# Patient Record
Sex: Female | Born: 1950 | Race: White | Hispanic: No | Marital: Single | Smoking: Never smoker
Health system: Southern US, Community
[De-identification: ages and names within clinical notes are randomized; demographics above are authoritative.]

## PROBLEM LIST (undated history)

## (undated) DIAGNOSIS — F419 Anxiety disorder, unspecified: Secondary | ICD-10-CM

## (undated) DIAGNOSIS — M5412 Radiculopathy, cervical region: Secondary | ICD-10-CM

## (undated) DIAGNOSIS — M7989 Other specified soft tissue disorders: Secondary | ICD-10-CM

## (undated) DIAGNOSIS — N649 Disorder of breast, unspecified: Secondary | ICD-10-CM

## (undated) DIAGNOSIS — N289 Disorder of kidney and ureter, unspecified: Secondary | ICD-10-CM

## (undated) DIAGNOSIS — G4733 Obstructive sleep apnea (adult) (pediatric): Secondary | ICD-10-CM

## (undated) DIAGNOSIS — D126 Benign neoplasm of colon, unspecified: Secondary | ICD-10-CM

## (undated) DIAGNOSIS — I5030 Unspecified diastolic (congestive) heart failure: Secondary | ICD-10-CM

## (undated) DIAGNOSIS — I272 Pulmonary hypertension, unspecified: Secondary | ICD-10-CM

## (undated) DIAGNOSIS — M79606 Pain in leg, unspecified: Secondary | ICD-10-CM

## (undated) DIAGNOSIS — E538 Deficiency of other specified B group vitamins: Secondary | ICD-10-CM

## (undated) DIAGNOSIS — N183 Chronic kidney disease, stage 3 unspecified: Secondary | ICD-10-CM

## (undated) DIAGNOSIS — K819 Cholecystitis, unspecified: Secondary | ICD-10-CM

## (undated) DIAGNOSIS — E119 Type 2 diabetes mellitus without complications: Secondary | ICD-10-CM

## (undated) DIAGNOSIS — R0602 Shortness of breath: Secondary | ICD-10-CM

## (undated) DIAGNOSIS — M2550 Pain in unspecified joint: Secondary | ICD-10-CM

## (undated) DIAGNOSIS — E559 Vitamin D deficiency, unspecified: Secondary | ICD-10-CM

## (undated) DIAGNOSIS — F32A Depression, unspecified: Secondary | ICD-10-CM

## (undated) DIAGNOSIS — M109 Gout, unspecified: Secondary | ICD-10-CM

## (undated) DIAGNOSIS — M1990 Unspecified osteoarthritis, unspecified site: Secondary | ICD-10-CM

## (undated) DIAGNOSIS — I319 Disease of pericardium, unspecified: Secondary | ICD-10-CM

## (undated) DIAGNOSIS — H409 Unspecified glaucoma: Secondary | ICD-10-CM

## (undated) DIAGNOSIS — M549 Dorsalgia, unspecified: Secondary | ICD-10-CM

## (undated) DIAGNOSIS — E039 Hypothyroidism, unspecified: Secondary | ICD-10-CM

## (undated) DIAGNOSIS — E785 Hyperlipidemia, unspecified: Secondary | ICD-10-CM

## (undated) DIAGNOSIS — N611 Abscess of the breast and nipple: Secondary | ICD-10-CM

## (undated) DIAGNOSIS — K5900 Constipation, unspecified: Secondary | ICD-10-CM

## (undated) DIAGNOSIS — F329 Major depressive disorder, single episode, unspecified: Secondary | ICD-10-CM

## (undated) HISTORY — DX: Hypothyroidism, unspecified: E03.9

## (undated) HISTORY — DX: Benign neoplasm of colon, unspecified: D12.6

## (undated) HISTORY — DX: Deficiency of other specified B group vitamins: E53.8

## (undated) HISTORY — DX: Unspecified glaucoma: H40.9

## (undated) HISTORY — DX: Dorsalgia, unspecified: M54.9

## (undated) HISTORY — DX: Type 2 diabetes mellitus without complications: E11.9

## (undated) HISTORY — DX: Unspecified osteoarthritis, unspecified site: M19.90

## (undated) HISTORY — DX: Depression, unspecified: F32.A

## (undated) HISTORY — DX: Major depressive disorder, single episode, unspecified: F32.9

## (undated) HISTORY — DX: Anxiety disorder, unspecified: F41.9

## (undated) HISTORY — DX: Abscess of the breast and nipple: N61.1

## (undated) HISTORY — DX: Pain in unspecified joint: M25.50

## (undated) HISTORY — DX: Cholecystitis, unspecified: K81.9

## (undated) HISTORY — DX: Radiculopathy, cervical region: M54.12

## (undated) HISTORY — DX: Unspecified diastolic (congestive) heart failure: I50.30

## (undated) HISTORY — DX: Chronic kidney disease, stage 3 unspecified: N18.30

## (undated) HISTORY — DX: Disorder of kidney and ureter, unspecified: N28.9

## (undated) HISTORY — DX: Morbid (severe) obesity due to excess calories: E66.01

## (undated) HISTORY — DX: Chronic kidney disease, stage 3 (moderate): N18.3

## (undated) HISTORY — DX: Disease of pericardium, unspecified: I31.9

## (undated) HISTORY — DX: Gout, unspecified: M10.9

## (undated) HISTORY — DX: Pain in leg, unspecified: M79.606

## (undated) HISTORY — DX: Other specified soft tissue disorders: M79.89

## (undated) HISTORY — DX: Hyperlipidemia, unspecified: E78.5

## (undated) HISTORY — DX: Constipation, unspecified: K59.00

## (undated) HISTORY — DX: Disorder of breast, unspecified: N64.9

## (undated) HISTORY — DX: Obstructive sleep apnea (adult) (pediatric): G47.33

## (undated) HISTORY — DX: Vitamin D deficiency, unspecified: E55.9

## (undated) HISTORY — PX: CATARACT EXTRACTION: SUR2

## (undated) HISTORY — DX: Pulmonary hypertension, unspecified: I27.20

---
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
## 1988-10-06 HISTORY — PX: BREAST BIOPSY: SHX20

## 1998-06-27 ENCOUNTER — Encounter: Payer: Self-pay | Admitting: Family Medicine

## 1998-06-27 ENCOUNTER — Ambulatory Visit (HOSPITAL_COMMUNITY): Admission: RE | Admit: 1998-06-27 | Discharge: 1998-06-27 | Payer: Self-pay | Admitting: Family Medicine

## 1999-10-04 ENCOUNTER — Emergency Department (HOSPITAL_COMMUNITY): Admission: EM | Admit: 1999-10-04 | Discharge: 1999-10-04 | Payer: Self-pay | Admitting: *Deleted

## 1999-11-13 ENCOUNTER — Encounter: Payer: Self-pay | Admitting: Emergency Medicine

## 1999-11-13 ENCOUNTER — Emergency Department (HOSPITAL_COMMUNITY): Admission: EM | Admit: 1999-11-13 | Discharge: 1999-11-13 | Payer: Self-pay | Admitting: Emergency Medicine

## 1999-11-29 ENCOUNTER — Ambulatory Visit (HOSPITAL_COMMUNITY): Admission: RE | Admit: 1999-11-29 | Discharge: 1999-11-29 | Payer: Self-pay | Admitting: Orthopedic Surgery

## 1999-11-29 ENCOUNTER — Encounter: Payer: Self-pay | Admitting: Orthopedic Surgery

## 2000-08-25 ENCOUNTER — Ambulatory Visit (HOSPITAL_COMMUNITY): Admission: RE | Admit: 2000-08-25 | Discharge: 2000-08-25 | Payer: Self-pay | Admitting: Obstetrics and Gynecology

## 2000-08-25 ENCOUNTER — Encounter: Payer: Self-pay | Admitting: Obstetrics and Gynecology

## 2001-07-05 ENCOUNTER — Other Ambulatory Visit: Admission: RE | Admit: 2001-07-05 | Discharge: 2001-07-05 | Payer: Self-pay | Admitting: Obstetrics and Gynecology

## 2001-08-17 ENCOUNTER — Encounter: Payer: Self-pay | Admitting: Gastroenterology

## 2001-08-17 ENCOUNTER — Encounter (INDEPENDENT_AMBULATORY_CARE_PROVIDER_SITE_OTHER): Payer: Self-pay | Admitting: Specialist

## 2001-08-17 ENCOUNTER — Ambulatory Visit: Admission: RE | Admit: 2001-08-17 | Discharge: 2001-08-17 | Payer: Self-pay | Admitting: Gastroenterology

## 2002-06-30 ENCOUNTER — Encounter: Payer: Self-pay | Admitting: Internal Medicine

## 2002-06-30 ENCOUNTER — Ambulatory Visit (HOSPITAL_COMMUNITY): Admission: RE | Admit: 2002-06-30 | Discharge: 2002-06-30 | Payer: Self-pay | Admitting: Obstetrics

## 2002-07-08 ENCOUNTER — Other Ambulatory Visit: Admission: RE | Admit: 2002-07-08 | Discharge: 2002-07-08 | Payer: Self-pay | Admitting: Obstetrics and Gynecology

## 2002-10-24 ENCOUNTER — Ambulatory Visit (HOSPITAL_COMMUNITY): Admission: RE | Admit: 2002-10-24 | Discharge: 2002-10-24 | Payer: Self-pay | Admitting: Family Medicine

## 2002-10-24 ENCOUNTER — Encounter: Payer: Self-pay | Admitting: Family Medicine

## 2002-12-21 ENCOUNTER — Encounter: Admission: RE | Admit: 2002-12-21 | Discharge: 2003-03-21 | Payer: Self-pay | Admitting: Internal Medicine

## 2003-03-03 ENCOUNTER — Ambulatory Visit: Admission: RE | Admit: 2003-03-03 | Discharge: 2003-03-03 | Payer: Self-pay | Admitting: Family Medicine

## 2003-03-03 ENCOUNTER — Encounter: Payer: Self-pay | Admitting: Family Medicine

## 2003-03-22 ENCOUNTER — Encounter: Payer: Self-pay | Admitting: Family Medicine

## 2003-03-22 ENCOUNTER — Ambulatory Visit (HOSPITAL_COMMUNITY): Admission: RE | Admit: 2003-03-22 | Discharge: 2003-03-22 | Payer: Self-pay | Admitting: *Deleted

## 2004-07-10 ENCOUNTER — Ambulatory Visit (HOSPITAL_COMMUNITY): Admission: RE | Admit: 2004-07-10 | Discharge: 2004-07-10 | Payer: Self-pay | Admitting: Endocrinology

## 2004-07-16 ENCOUNTER — Other Ambulatory Visit: Admission: RE | Admit: 2004-07-16 | Discharge: 2004-07-16 | Payer: Self-pay | Admitting: Obstetrics and Gynecology

## 2005-07-23 ENCOUNTER — Ambulatory Visit (HOSPITAL_COMMUNITY): Admission: RE | Admit: 2005-07-23 | Discharge: 2005-07-23 | Payer: Self-pay | Admitting: Family Medicine

## 2005-08-04 ENCOUNTER — Other Ambulatory Visit: Admission: RE | Admit: 2005-08-04 | Discharge: 2005-08-04 | Payer: Self-pay | Admitting: Obstetrics and Gynecology

## 2005-12-10 ENCOUNTER — Ambulatory Visit: Payer: Self-pay | Admitting: Gastroenterology

## 2006-01-07 ENCOUNTER — Encounter: Admission: RE | Admit: 2006-01-07 | Discharge: 2006-01-07 | Payer: Self-pay | Admitting: Occupational Medicine

## 2006-01-21 ENCOUNTER — Ambulatory Visit: Payer: Self-pay | Admitting: Gastroenterology

## 2006-01-21 ENCOUNTER — Encounter (INDEPENDENT_AMBULATORY_CARE_PROVIDER_SITE_OTHER): Payer: Self-pay | Admitting: Specialist

## 2006-01-30 ENCOUNTER — Ambulatory Visit (HOSPITAL_COMMUNITY): Admission: RE | Admit: 2006-01-30 | Discharge: 2006-01-30 | Payer: Self-pay | Admitting: Occupational Medicine

## 2008-03-14 ENCOUNTER — Ambulatory Visit (HOSPITAL_COMMUNITY): Admission: RE | Admit: 2008-03-14 | Discharge: 2008-03-14 | Payer: Self-pay | Admitting: Family Medicine

## 2008-10-06 HISTORY — PX: SHOULDER SURGERY: SHX246

## 2009-01-17 ENCOUNTER — Inpatient Hospital Stay (HOSPITAL_COMMUNITY): Admission: RE | Admit: 2009-01-17 | Discharge: 2009-01-19 | Payer: Self-pay | Admitting: Orthopedic Surgery

## 2009-02-28 ENCOUNTER — Encounter: Admission: RE | Admit: 2009-02-28 | Discharge: 2009-05-29 | Payer: Self-pay | Admitting: Orthopedic Surgery

## 2009-06-05 ENCOUNTER — Encounter: Admission: RE | Admit: 2009-06-05 | Discharge: 2009-06-20 | Payer: Self-pay | Admitting: Orthopedic Surgery

## 2010-10-27 ENCOUNTER — Encounter: Payer: Self-pay | Admitting: Occupational Medicine

## 2010-12-02 ENCOUNTER — Institutional Professional Consult (permissible substitution) (INDEPENDENT_AMBULATORY_CARE_PROVIDER_SITE_OTHER): Payer: Commercial Managed Care - PPO | Admitting: Cardiology

## 2010-12-02 ENCOUNTER — Encounter: Payer: Self-pay | Admitting: Cardiology

## 2010-12-02 DIAGNOSIS — E1165 Type 2 diabetes mellitus with hyperglycemia: Secondary | ICD-10-CM | POA: Insufficient documentation

## 2010-12-02 DIAGNOSIS — R072 Precordial pain: Secondary | ICD-10-CM

## 2010-12-02 DIAGNOSIS — R609 Edema, unspecified: Secondary | ICD-10-CM | POA: Insufficient documentation

## 2010-12-02 DIAGNOSIS — N183 Chronic kidney disease, stage 3 (moderate): Secondary | ICD-10-CM

## 2010-12-02 DIAGNOSIS — E1122 Type 2 diabetes mellitus with diabetic chronic kidney disease: Secondary | ICD-10-CM

## 2010-12-02 DIAGNOSIS — R0602 Shortness of breath: Secondary | ICD-10-CM

## 2010-12-03 ENCOUNTER — Ambulatory Visit (INDEPENDENT_AMBULATORY_CARE_PROVIDER_SITE_OTHER)
Admission: RE | Admit: 2010-12-03 | Discharge: 2010-12-03 | Disposition: A | Discharge status: Home / Self Care | Payer: 59 | Source: Ambulatory Visit | Attending: Cardiology | Admitting: Cardiology

## 2010-12-03 ENCOUNTER — Encounter: Payer: Self-pay | Admitting: Cardiology

## 2010-12-03 ENCOUNTER — Ambulatory Visit (HOSPITAL_BASED_OUTPATIENT_CLINIC_OR_DEPARTMENT_OTHER): Payer: 59 | Attending: Cardiology

## 2010-12-03 ENCOUNTER — Other Ambulatory Visit: Payer: Self-pay | Admitting: Cardiology

## 2010-12-03 ENCOUNTER — Other Ambulatory Visit: Payer: 59

## 2010-12-03 ENCOUNTER — Encounter (INDEPENDENT_AMBULATORY_CARE_PROVIDER_SITE_OTHER): Payer: Self-pay | Admitting: *Deleted

## 2010-12-03 DIAGNOSIS — R072 Precordial pain: Secondary | ICD-10-CM

## 2010-12-03 DIAGNOSIS — R0602 Shortness of breath: Secondary | ICD-10-CM

## 2010-12-03 DIAGNOSIS — R609 Edema, unspecified: Secondary | ICD-10-CM

## 2010-12-03 DIAGNOSIS — G4733 Obstructive sleep apnea (adult) (pediatric): Secondary | ICD-10-CM | POA: Insufficient documentation

## 2010-12-03 LAB — T4, FREE: Free T4: 0.93 ng/dL (ref 0.60–1.60)

## 2010-12-03 LAB — HEPATIC FUNCTION PANEL
ALT: 14 U/L (ref 0–35)
AST: 17 U/L (ref 0–37)
Albumin: 3.1 g/dL — ABNORMAL LOW (ref 3.5–5.2)
Alkaline Phosphatase: 63 U/L (ref 39–117)
Total Protein: 6.7 g/dL (ref 6.0–8.3)

## 2010-12-03 LAB — BASIC METABOLIC PANEL
BUN: 17 mg/dL (ref 6–23)
CO2: 25 mEq/L (ref 19–32)
Chloride: 101 mEq/L (ref 96–112)
Creatinine, Ser: 1.2 mg/dL (ref 0.4–1.2)
GFR: 51.09 mL/min — ABNORMAL LOW (ref >60.00)
Potassium: 4.5 mEq/L (ref 3.5–5.1)

## 2010-12-03 LAB — CBC WITH DIFFERENTIAL/PLATELET
HCT: 36.4 % (ref 36.0–46.0)
Hemoglobin: 12.2 g/dL (ref 12.0–15.0)
Lymphs Abs: 3.3 10*3/uL (ref 0.7–4.0)
MCHC: 33.7 g/dL (ref 30.0–36.0)
MCV: 85.8 fl (ref 78.0–100.0)
Neutrophils Relative %: 55.5 % (ref 43.0–77.0)
RBC: 4.24 Mil/uL (ref 3.87–5.11)
RDW: 15.4 % — ABNORMAL HIGH (ref 11.5–14.6)

## 2010-12-04 ENCOUNTER — Telehealth: Payer: Self-pay | Admitting: Cardiology

## 2010-12-04 ENCOUNTER — Ambulatory Visit (HOSPITAL_COMMUNITY): Payer: 59 | Attending: Cardiovascular Disease

## 2010-12-04 ENCOUNTER — Other Ambulatory Visit: Payer: Self-pay | Admitting: Cardiology

## 2010-12-04 DIAGNOSIS — R0602 Shortness of breath: Secondary | ICD-10-CM | POA: Insufficient documentation

## 2010-12-04 DIAGNOSIS — I2699 Other pulmonary embolism without acute cor pulmonale: Secondary | ICD-10-CM

## 2010-12-04 DIAGNOSIS — R079 Chest pain, unspecified: Secondary | ICD-10-CM | POA: Insufficient documentation

## 2010-12-04 DIAGNOSIS — E119 Type 2 diabetes mellitus without complications: Secondary | ICD-10-CM | POA: Insufficient documentation

## 2010-12-04 DIAGNOSIS — R609 Edema, unspecified: Secondary | ICD-10-CM | POA: Insufficient documentation

## 2010-12-05 ENCOUNTER — Telehealth: Payer: Self-pay | Admitting: Cardiology

## 2010-12-05 ENCOUNTER — Ambulatory Visit (INDEPENDENT_AMBULATORY_CARE_PROVIDER_SITE_OTHER)
Admission: RE | Admit: 2010-12-05 | Discharge: 2010-12-05 | Disposition: A | Discharge status: Home / Self Care | Payer: 59 | Source: Ambulatory Visit | Attending: Cardiology | Admitting: Cardiology

## 2010-12-05 DIAGNOSIS — I2699 Other pulmonary embolism without acute cor pulmonale: Secondary | ICD-10-CM

## 2010-12-05 MED ORDER — IOHEXOL 300 MG/ML  SOLN
80.0000 mL | Freq: Once | INTRAMUSCULAR | Status: AC | PRN
  Administered 2010-12-05: 80 mL via INTRAVENOUS

## 2010-12-06 ENCOUNTER — Other Ambulatory Visit: Payer: 59

## 2010-12-12 ENCOUNTER — Ambulatory Visit (INDEPENDENT_AMBULATORY_CARE_PROVIDER_SITE_OTHER): Payer: 59 | Admitting: Cardiology

## 2010-12-12 ENCOUNTER — Encounter: Payer: Self-pay | Admitting: Cardiology

## 2010-12-12 DIAGNOSIS — R072 Precordial pain: Secondary | ICD-10-CM

## 2010-12-12 DIAGNOSIS — I319 Disease of pericardium, unspecified: Secondary | ICD-10-CM

## 2010-12-12 DIAGNOSIS — R0602 Shortness of breath: Secondary | ICD-10-CM

## 2010-12-12 HISTORY — DX: Disease of pericardium, unspecified: I31.9

## 2010-12-12 NOTE — Progress Notes (Signed)
Summary: CTA   Phone Note Outgoing Call   Call placed by: Julieta Gutting, RN, BSN,  December 04, 2010 4:46 PM Call placed to: Patient Summary of Call: Pt has slightly elevated D-dimer on labwork.  Per Dr Riley Kill obtain CTA of chest PE protocol on Friday 12/06/10. The pt has been scheduled for this test on 12/06/10 at 1:30 in the afternoon.  NPO 2 hours prior to test.  The pt does take Glucophage and should hold med the morning and night of test, also hold 48 hours after test. I attempted to reach the pt at work and home with no answer. Per allergy list this pt does not have IV dye allergy--please verify allergies again when speaking with the pt.  Initial call taken by: Julieta Gutting, RN, BSN,  December 04, 2010 4:46 PM  Follow-up for Phone Call        Attempted to reach pt at home number with no answer.  Julieta Gutting, RN, BSN  December 04, 2010 6:05 PM   Pt. aware of CTA  test scheduled for today at 3:30 PM . Pt. states has taken the Glucophage at 6:00 AM today. Rose at CT aware.Rose states, it will be okay. She will  remind the pt. to hold medication the next 2 days. Pt. notified. Follow-up by: Ollen Gross, RN, BSN,  December 05, 2010 10:49 AM

## 2010-12-12 NOTE — Progress Notes (Signed)
Summary: pt rtn call from yesterday   Phone Note Call from Patient Call back at [phone]   Caller: Patient Reason for Call: Talk to Nurse, Talk to Doctor, Lab or Test Results Summary of Call: pt rtn call from yesterday she thinks it was to get test results Initial call taken by: Omer Jack,  December 05, 2010 8:42 AM  Follow-up for Phone Call         Pt. aware of  D-dime result and  of CTA scheduled for today at 3:30 PM.  Follow-up by: Ollen Gross, RN, BSN,  December 05, 2010 11:03 AM

## 2010-12-12 NOTE — Assessment & Plan Note (Signed)
Summary: chestpain, sob  Medications Added GLUCOPHAGE 1000 MG TABS (METFORMIN HCL) Take 1 tablet by mouth two times a day CRESTOR 40 MG TABS (ROSUVASTATIN CALCIUM) Take one tablet by mouth daily. FUROSEMIDE 20 MG TABS (FUROSEMIDE) Take one tablet by mouth daily. * INSULIN 73/30 as directed      Allergies Added: ! PENICILLIN  Visit Type:  Initial Consult  CC:  Chest pains- Sob .  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  Her chief complaint is progressive shortness of breath that has occurred over about three to four months.  It has progressively gotten worse.  Of note, she has continued to gain weight during that period of time.  She has occasional brief short episodes of chest pain that wake her up at night, but do not last long.  Exertional chest tightness, as a rule, has not been a problem.   She falls asleep alot, and does snore at night.  Her BP has been recently climbing.  She is here for evaluation.  There is no radiation to her discomfort.    Current Medications (verified): 1)  Glucophage 1000 Mg Tabs (Metformin Hcl) .... Take 1 Tablet By Mouth Two Times A Day 2)  Crestor 40 Mg Tabs (Rosuvastatin Calcium) .... Take One Tablet By Mouth Daily. 3)  Furosemide 20 Mg Tabs (Furosemide) .... Take One Tablet By Mouth Daily. 4)  Insulin 73/30 .... As Directed  Allergies (verified): 1)  ! Penicillin  Past History:  Past Medical History: DM type 2 Chest pains Sob  Past Surgical History: Shoulder surgery - 2009 Tonsillectomy Breast biopsy  Family History: Mother alive 26- Heart problems- HTN Father alive 91- Bypass surgery- DM Brothers- 2.   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  The patient denies anorexia, fever, weight loss, vision loss, decreased hearing, hoarseness, syncope, prolonged cough, headaches, hemoptysis, abdominal pain, melena, hematochezia, severe indigestion/heartburn, and unusual weight change.    Vital Signs:  Patient profile:   60 year old female Height:      65 inches Weight:      307.50 pounds BMI:     51.36 O2 Sat:      95 % on Room air Pulse rate:   91 / minute Pulse rhythm:   regular Resp:     20 per minute BP sitting:   130 / 84  (right arm) Cuff size:   large  Vitals Entered By: Vikki Ports (December 02, 2010 4:35 PM)  O2 Flow:  Room air  Physical Exam  General:  obese female in NAD.  Very pleasant demeanor. Head:  normocephalic and atraumatic Eyes:  PERRLA/EOM intact; conjunctiva and lids normal. Neck:  Neck supple, no JVD. No masses, thyromegaly or abnormal cervical nodes. Lungs:  Clear bilaterally to auscultation and percussion. Heart:  PMI non displaced.  Normal S1 and S2.  No murmurs or rub.   No gallops. Abdomen:  No hepatosplenomegaly but hard to feel.  Large panus.  Multiple areas of excoriation secondary to curling iron burn. Msk:  Back normal, normal gait. Muscle strength and tone normal. Pulses:  pulses normal in all 4 extremities Extremities:  Trace edema. Neurologic:  Alert and oriented x 3.   EKG  Procedure date:  12/02/2010  Findings:      NSR.  Low voltage QRS.  Leftward axis.  Impression & Recommendations:  Problem # 1:  DYSPNEA (ICD-786.05) Patient has had progressive dyspnea.  The etiology of this is unclear.  It could be in part related to her weight, but the key will be trying to exclude other abnormalities.  Her exam is not revealing.  There is not a suggestion of occult DVT, but could not exclude.  PPH and PE must be in the differential.  With DM, cannot exclude CAD.  Her history also would suggest OSA.  Favor starting workup with 2D echo, labs,  sleep study.  If d-dimer elevated, would consider both CXR and CT scan to exclude PE.  Would not exclude chronic PE, which VQ would be better, but would hold off to see 2 D.  She will likely need an agressive nutrition program for long term success on multiple fronts.  Will see back in a week. Her updated medication list for this problem includes:    Furosemide 20 Mg Tabs (Furosemide) .Marland Kitchen... Take one tablet by mouth daily.  Orders: EKG w/ Interpretation (93000) T-2 View CXR (71020TC) Echocardiogram (Echo) Sleep Disorder Referral (Sleep Disorder)  Problem # 2:  CHEST PAIN, PRECORDIAL (ICD-786.51) Somewhat atypical but several risk factors.  Proceed with workup.  May need cath from radial, but defer until other workup complelte.  Orders: EKG w/ Interpretation (93000) T-2 View CXR (71020TC) Echocardiogram (Echo) Sleep Disorder Referral (Sleep Disorder)  Problem # 3:  DM (ICD-250.00) clearly type II.  Needs nutrition management.   Her updated medication list for this problem includes:    Glucophage 1000 Mg Tabs (Metformin hcl) .Marland Kitchen... Take 1 tablet by mouth two times a day  Patient Instructions: 1)  Your physician recommends that you schedule a follow-up appointment in: 2 WEEKS 2)  Your physician recommends that you return for lab work in: D-dimer, BMP, CBC, TSH, Free T4, Liver, and Chest x-ray 3)  Your physician has requested that you have an echocardiogram.  Echocardiography is a painless test that uses sound waves to create images of your heart. It provides your doctor with information about the size and shape of your heart and how well your heart's chambers and valves are working.  This procedure takes approximately one hour. There are no restrictions for this procedure. 4)  Your physician has recommended that you have a sleep study.  This test records several body functions during sleep, including:  brain activity, eye movement, oxygen and carbon dioxide blood levels, heart rate and rhythm,  breathing rate and rhythm, the flow of air through your mouth and nose, snoring, body muscle movements, and chest and belly movement.

## 2010-12-16 DIAGNOSIS — G4733 Obstructive sleep apnea (adult) (pediatric): Secondary | ICD-10-CM

## 2010-12-23 ENCOUNTER — Encounter: Payer: Self-pay | Admitting: Cardiology

## 2010-12-23 DIAGNOSIS — G4733 Obstructive sleep apnea (adult) (pediatric): Secondary | ICD-10-CM | POA: Insufficient documentation

## 2010-12-23 HISTORY — DX: Obstructive sleep apnea (adult) (pediatric): G47.33

## 2011-01-02 NOTE — Miscellaneous (Signed)
Summary: Orders Update  Clinical Lists Changes  Problems: Added new problem of OBSTRUCTIVE SLEEP APNEA (ICD-327.23) Orders: Added new Referral order of Pulmonary Referral (Pulmonary) - Signed

## 2011-01-02 NOTE — Assessment & Plan Note (Signed)
Summary: Alice Kemp   Visit Type:  Follow-up   History of Present Illness: I had Myrl return today in a follow up visit.  She had a preliminary sleep study but this has not been read yet.  She did have other studies done, and we have reviewed these today in detail.  She does continue to have some symptoms, and we discussed options with regard to further evaluation, including possible cardiac cath study.  Labs including thyroid have been unremarkable, but a d-dimer was slightly elevated thus leading to her CT angio to exclude pulmonary emboli.  Based on her weight, it would be hard to exclude DVT on exam, especially with presenting complaints of chest pain, and dyspnea.  A Magnone effusion was noted, but it was not presenting hemodynamic issues at present.   Problems Prior to Update: 1)  Pericardial Effusion  (ICD-423.9) 2)  Dm  (ICD-250.00) 3)  Dyspnea  (ICD-786.05) 4)  Edema  (ICD-782.3) 5)  Chest Pain, Precordial  (ICD-786.51)  Current Medications (verified): 1)  Glucophage 1000 Mg Tabs (Metformin Hcl) .... Take 1 Tablet By Mouth Two Times A Day 2)  Crestor 40 Mg Tabs (Rosuvastatin Calcium) .... Take One Tablet By Mouth Daily. 3)  Furosemide 20 Mg Tabs (Furosemide) .... Take One Tablet By Mouth Daily. 4)  Insulin 73/30 .... As Directed 5)  Aleve 220 Mg Tabs (Naproxen Sodium) .... 2-3 Tablets As Needed  Allergies (verified): 1)  ! Penicillin  Past History:  Past Medical History: Last updated: 12/02/2010 DM type 2 Chest pains Sob  Past Surgical History: Last updated: 12/02/2010 Shoulder surgery - 2009 Tonsillectomy Breast biopsy  Family History: Last updated: 12/02/2010 Mother alive 55- Heart problems- HTN Father alive 77- Bypass surgery- DM Brothers- 2.   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 Head:  normocephalic and atraumatic Eyes:  PERRLA/EOM intact; conjunctiva and lids normal. Lungs:  Clear bilaterally to auscultation and percussion. Heart:  PMI non displaced. Normal S1 and S2.  No murmur or rub.  No gallop Pulses:  pulses normal in all 4 extremities Extremities:  No clubbing or cyanosis. Neurologic:  Alert and oriented x 3.   CT Scan  Procedure date:  12/05/2010  Findings:      Comparison:  Chest radiograph 11/13/2010  Findings:  No filling defects within the pulmonary to suggest acute pulmonary embolism.  No acute findings of the aorta or great vessels.  There is a Caba pericardial effusion which measures 12 mm in depth along the dependent left ventricle.  No evidence of axillary or supraclavicular lymphadenopathy.  No mediastinal or hilar lymphadenopathy.  Esophagus is normal.  Review of the lung parenchyma demonstrates no pleural fluid, consolidation, or pneumothorax.  Limited view of the upper abdomen is unremarkable.  Limited view of the skeleton demonstrates osteophytosis of the thoracic spine.  The  Review of the MIP images confirms the above  findings.  IMPRESSION:  1.  No evidence of acute pulmonary embolism. 2.  No acute pulmonary parenchymal abnormality. 3.  Bin pericardial effusion.  Original Report Authenticated By: Genevive Bi, M.D.   Echocardiogram  Procedure date:  12/04/2010  Findings:      Left ventricle: The cavity size was normal. Wall thickness was       increased in a pattern of mild LVH. Systolic function was normal.       The estimated ejection fraction was in the range of 60% to  65%.       Although no diagnostic regional wall motion abnormality was       identified, this possibility cannot be completely excluded on the       basis of this study. Doppler parameters are consistent with       abnormal left ventricular relaxation (grade 1 diastolic       dysfunction).     - Aortic valve: There was no stenosis.     - Mitral valve: No significant regurgitation.     - Left atrium: The atrium was mildly dilated.     - Right ventricle: The cavity size was normal. Systolic function was       normal.     - Pulmonary arteries: No complete TR doppler jet so unable to       estimate PA systolic pressure.     - Systemic veins: The IVC was not visualized.     - Pericardium, extracardiac: Castagnola circumferential pericardial       effusion without tamponade.     Impressions:            - Normal LV size with mild LV hypertrophy. EF 60-65%. Normal RV size       and systolic function. No significant valvular dysfunction. Demery       circumferential pericardial effusion.  Impression & Recommendations:  Problem # 1:  PERICARDIAL EFFUSION (ICD-423.9) doubt this is the source of symptoms.  Nonetheless, repeat echo is indicated to exclude progression of effusion. Her updated medication list for this problem includes:    Furosemide 20 Mg Tabs (Furosemide) .Marland Kitchen... Take one tablet by mouth daily.    Aleve 220 Mg Tabs (Naproxen sodium) .Marland Kitchen... 2-3 tablets as needed  Orders: Echocardiogram (Echo)  Problem # 2:  CHEST PAIN, PRECORDIAL (ICD-786.51) I would consider a cardiac cath from the radial approach in light of her symptoms.  Historically she has a positive stress test.  I doubt a nuclear study would help [name] in this regard, because of the potential for breast attenuation.  She will consider Orders: Echocardiogram (Echo)  Problem # 3:  DM (ICD-250.00) currently on oral meds.  Needs weight loss going forward.   Her updated medication list for this problem includes:    Glucophage 1000  Mg Tabs (Metformin hcl) .Marland Kitchen... Take 1 tablet by mouth two times a day  Patient Instructions: 1)  Your physician recommends that you schedule a follow-up appointment in: 6 WEEKS 2)  Your physician recommends that you continue on your current medications as directed. Please refer to the Current Medication list given to you today. 3)  Your physician has requested that you have an echocardiogram in 6 WEEKS.  Echocardiography is a painless test that uses sound waves to create images of your heart. It provides your doctor with information about the size and shape of your heart and how well your heart's chambers and valves are working.  This procedure takes approximately one hour. There  are no restrictions for this procedure. 4)  Please call the office at [phone] if you would like to schedule cardiac catheterization.

## 2011-01-07 ENCOUNTER — Telehealth: Payer: Self-pay | Admitting: Cardiology

## 2011-01-07 NOTE — Telephone Encounter (Signed)
I spoke with the pt and she would like to know if our office can get her an earlier appointment with Pulmonary.  The pt is currently scheduled to see Dr Shelle Iron on 01/27/11.  I spoke with Shawna Orleans in the pulmonary department and Dr Shelle Iron does not have any earlier availability at this time.  She said the pt could call the office at any time to check for cancellations. Pt aware and agreed with plan.

## 2011-01-15 LAB — GLUCOSE, CAPILLARY
Glucose-Capillary: 148 mg/dL — ABNORMAL HIGH (ref 70–99)
Glucose-Capillary: 158 mg/dL — ABNORMAL HIGH (ref 70–99)
Glucose-Capillary: 181 mg/dL — ABNORMAL HIGH (ref 70–99)
Glucose-Capillary: 199 mg/dL — ABNORMAL HIGH (ref 70–99)
Glucose-Capillary: 204 mg/dL — ABNORMAL HIGH (ref 70–99)
Glucose-Capillary: 230 mg/dL — ABNORMAL HIGH (ref 70–99)

## 2011-01-15 LAB — CBC
HCT: 35.4 % — ABNORMAL LOW (ref 36.0–46.0)
MCV: 86.5 fL (ref 78.0–100.0)
Platelets: 346 10*3/uL (ref 150–400)
RDW: 14.6 % (ref 11.5–15.5)

## 2011-01-15 LAB — BASIC METABOLIC PANEL
CO2: 23 mEq/L (ref 19–32)
Creatinine, Ser: 1.22 mg/dL — ABNORMAL HIGH (ref 0.4–1.2)
Glucose, Bld: 317 mg/dL — ABNORMAL HIGH (ref 70–99)
Sodium: 133 mEq/L — ABNORMAL LOW (ref 135–145)

## 2011-01-15 LAB — TYPE AND SCREEN: ABO/RH(D): A POS

## 2011-01-17 LAB — BASIC METABOLIC PANEL
Potassium: 4.6 mmol/L (ref 3.4–5.3)
Sodium: 137 mmol/L (ref 137–147)

## 2011-01-17 LAB — LIPID PANEL
Cholesterol: 146 mg/dL (ref 0–200)
HDL: 40 mg/dL (ref 35–70)
LDL Cholesterol: 59 mg/dL

## 2011-01-18 ENCOUNTER — Encounter: Payer: Self-pay | Admitting: Cardiology

## 2011-01-20 ENCOUNTER — Other Ambulatory Visit (HOSPITAL_COMMUNITY): Payer: Self-pay | Admitting: Radiology

## 2011-01-20 DIAGNOSIS — I313 Pericardial effusion (noninflammatory): Secondary | ICD-10-CM

## 2011-01-21 ENCOUNTER — Ambulatory Visit (INDEPENDENT_AMBULATORY_CARE_PROVIDER_SITE_OTHER): Payer: 59 | Admitting: Cardiology

## 2011-01-21 ENCOUNTER — Encounter: Payer: Self-pay | Admitting: Cardiology

## 2011-01-21 ENCOUNTER — Ambulatory Visit (HOSPITAL_COMMUNITY): Payer: 59 | Attending: Cardiology | Admitting: Radiology

## 2011-01-21 DIAGNOSIS — R079 Chest pain, unspecified: Secondary | ICD-10-CM

## 2011-01-21 DIAGNOSIS — R0600 Dyspnea, unspecified: Secondary | ICD-10-CM

## 2011-01-21 DIAGNOSIS — I313 Pericardial effusion (noninflammatory): Secondary | ICD-10-CM

## 2011-01-21 DIAGNOSIS — R0989 Other specified symptoms and signs involving the circulatory and respiratory systems: Secondary | ICD-10-CM | POA: Insufficient documentation

## 2011-01-21 DIAGNOSIS — R072 Precordial pain: Secondary | ICD-10-CM

## 2011-01-21 DIAGNOSIS — E785 Hyperlipidemia, unspecified: Secondary | ICD-10-CM | POA: Insufficient documentation

## 2011-01-21 DIAGNOSIS — I319 Disease of pericardium, unspecified: Secondary | ICD-10-CM

## 2011-01-21 DIAGNOSIS — R0609 Other forms of dyspnea: Secondary | ICD-10-CM | POA: Insufficient documentation

## 2011-01-21 NOTE — Patient Instructions (Signed)
Your physician has requested that you have a cardiac catheterization. Cardiac catheterization is used to diagnose and/or treat various heart conditions. Doctors may recommend this procedure for a number of different reasons. The most common reason is to evaluate chest pain. Chest pain can be a symptom of coronary artery disease (CAD), and cardiac catheterization can show whether plaque is narrowing or blocking your heart's arteries. This procedure is also used to evaluate the valves, as well as measure the blood flow and oxygen levels in different parts of your heart. For further information please visit www.cardiosmart.org. Please follow instruction sheet, as given.   Your physician recommends that you continue on your current medications as directed. Please refer to the Current Medication list given to you today.  

## 2011-01-27 ENCOUNTER — Ambulatory Visit (INDEPENDENT_AMBULATORY_CARE_PROVIDER_SITE_OTHER): Payer: 59 | Admitting: Pulmonary Disease

## 2011-01-27 ENCOUNTER — Encounter: Payer: Self-pay | Admitting: Pulmonary Disease

## 2011-01-27 VITALS — BP 126/82 | HR 99 | Temp 98.3°F | Ht 64.0 in | Wt 300.0 lb

## 2011-01-27 DIAGNOSIS — G4733 Obstructive sleep apnea (adult) (pediatric): Secondary | ICD-10-CM

## 2011-01-27 NOTE — Patient Instructions (Signed)
Would strongly consider trial of cpap while working on weight loss.  An alternative, but not as good, would be dental appliance.  Please consider, and let me know.

## 2011-01-27 NOTE — Progress Notes (Signed)
Subjective:    Patient ID: Alice Kemp, female    DOB: 07/11/1951, 60 y.o.   MRN: [id number]  HPI The pt is a 59y/o female who I have been asked to see for management of osa.  She underwent npsg 11/2010, where she was found to have AHI 25/hr and significant cpap intolerance.  Her history is significant for the following:  -loud snoring with arousals, and nonrestorative sleep. - EDS during day when not working (stays very busy at work).  Denies sleepiness issues with driving. -weight increased 35-40 pounds over the last two years.   Sleep Questionnaire:   What time do you typically go to bed?( Between what hours)  10 to 11 pm         How long does it take you to fall asleep?  15 mins         How many times during the night do you wake up?  3         What time do you get out of bed to start your day?  0730         Do you drive or operate heavy machinery in your occupation?  No         How much has your weight changed (up or down) over the past two years? (In pounds)  40 lb (18.144 kg)         Have you ever had a sleep study before?   Yes         If yes, location of study?  [facility]         If yes, date of study?  11/2010         Do you currently use CPAP?  No         Do you wear oxygen at any time?  No              Review of Systems  Constitutional: Positive for unexpected weight change. Negative for fever.  HENT: Positive for congestion. Negative for ear pain, nosebleeds, sore throat, rhinorrhea, sneezing, trouble swallowing, dental problem, postnasal drip and sinus pressure.   Eyes: Positive for itching. Negative for redness.  Respiratory: Positive for cough and shortness of breath. Negative for chest tightness and wheezing.   Cardiovascular: Positive for chest pain and leg swelling. Negative for palpitations.  Gastrointestinal: Negative for nausea and vomiting.  Genitourinary: Negative for dysuria.  Musculoskeletal: Positive for joint swelling.  Skin: Negative for rash.    Neurological: Negative for headaches.  Hematological: Does not bruise/bleed easily.  Psychiatric/Behavioral: Negative for dysphoric mood. The patient is not nervous/anxious.        Objective:   Physical Exam Constitutional:  Obese female , no acute distress  HENT:  Nares patent without discharge, but narrowing on right  Oropharynx without exudate, palate and uvula are elongated with narrowing posteriorly  Eyes:  Perrla, eomi, no scleral icterus  Neck:  No JVD, no TMG  Cardiovascular:  Normal rate, regular rhythm, no rubs or gallops.  No murmurs        Intact distal pulses  Pulmonary :  Normal breath sounds, no stridor or respiratory distress   No rales, rhonchi, or wheezing  Abdominal:  Soft, nondistended, bowel sounds present.  No tenderness noted.   Musculoskeletal: 1+ lower extremity edema noted.  Lymph Nodes:  No cervical lymphadenopathy noted  Skin:  No cyanosis noted  Neurologic:  Awake but appears sleepy at time, appropriate, moves all 4 extremities without obvious deficit.  Assessment & Plan:

## 2011-01-27 NOTE — Assessment & Plan Note (Signed)
The pt has at least moderate osa, but has very significant oxygen desat.  Overall, I am concerned she really has severe disease.  I have had a long discussion with the pt about sleep apnea, including its impact on QOL and CV health.  I have recommended she try cpap while working on weight loss, but she doesn't feel she could tolerate this.  I have asked her to reconsider, and explained it is much easier to be desensitized in her own home than at sleep center.  As an alternative, she could consider a dental appliance, and I have given her literature on this to review.

## 2011-01-28 ENCOUNTER — Observation Stay (HOSPITAL_COMMUNITY)
Admission: RE | Admit: 2011-01-28 | Discharge: 2011-01-29 | Disposition: A | Discharge status: Home / Self Care | Payer: 59 | Source: Ambulatory Visit | Attending: Cardiology | Admitting: Cardiology

## 2011-01-28 DIAGNOSIS — Z794 Long term (current) use of insulin: Secondary | ICD-10-CM | POA: Insufficient documentation

## 2011-01-28 DIAGNOSIS — E119 Type 2 diabetes mellitus without complications: Secondary | ICD-10-CM | POA: Insufficient documentation

## 2011-01-28 DIAGNOSIS — G4730 Sleep apnea, unspecified: Secondary | ICD-10-CM | POA: Insufficient documentation

## 2011-01-28 DIAGNOSIS — I319 Disease of pericardium, unspecified: Secondary | ICD-10-CM | POA: Insufficient documentation

## 2011-01-28 DIAGNOSIS — R0602 Shortness of breath: Principal | ICD-10-CM | POA: Insufficient documentation

## 2011-01-28 DIAGNOSIS — Z01818 Encounter for other preprocedural examination: Secondary | ICD-10-CM | POA: Insufficient documentation

## 2011-01-28 DIAGNOSIS — E669 Obesity, unspecified: Secondary | ICD-10-CM | POA: Insufficient documentation

## 2011-01-28 DIAGNOSIS — R079 Chest pain, unspecified: Secondary | ICD-10-CM | POA: Insufficient documentation

## 2011-01-28 DIAGNOSIS — Z01810 Encounter for preprocedural cardiovascular examination: Secondary | ICD-10-CM | POA: Insufficient documentation

## 2011-01-28 LAB — POCT I-STAT 3, VENOUS BLOOD GAS (G3P V)
Acid-base deficit: 2 mmol/L (ref 0.0–2.0)
O2 Saturation: 56 %
O2 Saturation: 58 %
TCO2: 26 mmol/L (ref 0–100)
pCO2, Ven: 46.4 mmHg (ref 45.0–50.0)
pH, Ven: 7.375 — ABNORMAL HIGH (ref 7.250–7.300)

## 2011-01-28 LAB — CBC
HCT: 41.4 % (ref 36.0–46.0)
Hemoglobin: 13.8 g/dL (ref 12.0–15.0)
MCH: 28.2 pg (ref 26.0–34.0)
MCHC: 33.3 g/dL (ref 30.0–36.0)
MCV: 84.5 fL (ref 78.0–100.0)

## 2011-01-28 LAB — POCT I-STAT 3, ART BLOOD GAS (G3+)
Bicarbonate: 25.8 mEq/L — ABNORMAL HIGH (ref 20.0–24.0)
TCO2: 27 mmol/L (ref 0–100)
pCO2 arterial: 39.8 mmHg (ref 35.0–45.0)
pH, Arterial: 7.42 — ABNORMAL HIGH (ref 7.350–7.400)

## 2011-01-28 LAB — BASIC METABOLIC PANEL
CO2: 28 mEq/L (ref 19–32)
Calcium: 8.8 mg/dL (ref 8.4–10.5)
Chloride: 100 mEq/L (ref 96–112)
Creatinine, Ser: 1.17 mg/dL (ref 0.4–1.2)
Glucose, Bld: 301 mg/dL — ABNORMAL HIGH (ref 70–99)

## 2011-01-28 LAB — GLUCOSE, CAPILLARY
Glucose-Capillary: 298 mg/dL — ABNORMAL HIGH (ref 70–99)
Glucose-Capillary: 174 mg/dL — ABNORMAL HIGH (ref 70–99)

## 2011-01-28 NOTE — Assessment & Plan Note (Signed)
Symptoms of chest pain and shortness of breath remain, and or somewhat progressed.  I think she has risk factors with her DM, and clearly symptoms are difficult in light of her weight and activity.  CT is neg for PE, and echo shows preserved LV.  Think that cor angio with probably RHC is best option for at least getting to this.  She is scheduled to see Dr. Shelle Iron as well, but will set up cath.  Patient understands and consents to risks.

## 2011-01-28 NOTE — Assessment & Plan Note (Signed)
LV is normal, and effusion Schumacher.  Continue to observe.  Does not appear to be hemodynamic.

## 2011-01-28 NOTE — Progress Notes (Signed)
HPI:  The patient continues to have marked shortness of breath with exertion.  This has not improved.  Echo study reviewed with her.  She is scheduled to see Dr. Shelle Iron next week.   She cannot get very far, and gets some discomfort and winded.  Has not had prolonged rest chest pain.  Prior CT has been done and does not demonstrate evidence of PE.  However, she remains very symptomatic at this point.  Her risk factors include pos family history and DM.  No current outpatient prescriptions on file.    Allergies  Allergen Reactions  . Penicillins     Past Medical History  Diagnosis Date  . Diabetes mellitus     metformin  . Chest pain   . SOB (shortness of breath)   . OSA (obstructive sleep apnea)   . Hyperlipidemia     Past Surgical History  Procedure Date  . Shoulder surgery 2010    Left  . Tonsillectomy 1964  . Breast biopsy 1990    Left    Family History  Problem Relation Age of Onset  . Hypertension Mother   . Diabetes Father   . Hypertension Brother   . Heart disease Mother   . Heart disease Father   . Skin cancer Father   . Skin cancer Paternal Grandfather   . Breast cancer Paternal Aunt     History   Social History  . Marital Status: Single    Spouse Name: N/A    Number of Children: 0  . Years of Education: N/A   Occupational History  . 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 Smoking status: Never Smoker   . Smokeless tobacco: Not on file  . Alcohol Use: No  . Drug Use: No  . Sexually Active: Not on file   Other Topics Concern  . Not on file   Social History Narrative   Lives alone.    ROS: Please see the HPI.  All other systems reviewed and negative.  PHYSICAL EXAM:  BP 130/80  Pulse 94  Ht 5\' 4"  (1.626 m)  Wt 305 lb (138.347 kg)  BMI 52.35 kg/m2  General: Obese but very pleasant female in no acute distress. Head:  Normocephalic and atraumatic. Neck: no JVD Lungs: Clear to  auscultation and percussion. Heart: Normal S1 and S2.  No murmur, rubs or gallops.  Abdomen:  Normal bowel sounds; soft; non tender; no organomegaly Pulses: Pulses normal in all 4 extremities. Extremities: No clubbing or cyanosis. No edema. Neurologic: Alert and oriented x 3.  EKG:  ASSESSMENT AND PLAN:

## 2011-01-29 ENCOUNTER — Telehealth: Payer: Self-pay | Admitting: Cardiology

## 2011-01-29 DIAGNOSIS — R0600 Dyspnea, unspecified: Secondary | ICD-10-CM

## 2011-01-29 LAB — BASIC METABOLIC PANEL
Calcium: 8.8 mg/dL (ref 8.4–10.5)
GFR calc Af Amer: >60 mL/min (ref >60)
GFR calc non Af Amer: 55 mL/min — ABNORMAL LOW (ref >60)
Glucose, Bld: 196 mg/dL — ABNORMAL HIGH (ref 70–99)
Potassium: 4.4 mEq/L (ref 3.5–5.1)
Sodium: 139 mEq/L (ref 135–145)

## 2011-01-29 LAB — CBC
HCT: 38.3 % (ref 36.0–46.0)
MCHC: 32.9 g/dL (ref 30.0–36.0)
Platelets: 246 10*3/uL (ref 150–400)
RDW: 14.8 % (ref 11.5–15.5)
WBC: 10.1 10*3/uL (ref 4.0–10.5)

## 2011-01-29 LAB — GLUCOSE, CAPILLARY: Glucose-Capillary: 277 mg/dL — ABNORMAL HIGH (ref 70–99)

## 2011-01-29 NOTE — Telephone Encounter (Signed)
I spoke with the pt and she said that she had given [name] the incorrect dosage on her medications.  The pt has actually been taking Furosemide 40mg  once a day and Crestor 20mg  daily.  Per our records we had that the pt was taking 20mg  of Furosemide and 40mg  of Crestor.  I will correct her medication list.  The pt also said that Dr Riley Kill had increased her Furosemide after her cath to 40mg  daily because he thought she was taking 20mg  daily.  I will clarify what dose of Furosemide Dr Riley Kill would like for this pt to take.

## 2011-01-29 NOTE — Telephone Encounter (Signed)
Pt needs to talk to lauren re her meds.

## 2011-01-30 NOTE — Telephone Encounter (Signed)
I spoke with Dr Riley Kill and he would like the pt to increase Furosemide to 40mg  one and one-half tablet daily.  BMP on Monday and OV next week with Dr Riley Kill.

## 2011-01-30 NOTE — Discharge Summary (Addendum)
NAME:  Alice Kemp, [facility]                ACCOUNT NO.:  [id number]  MEDICAL RECORD NO.:  [id number]           PATIENT TYPE:  O  LOCATION:  6533                         FACILITY:  [facility]  PHYSICIAN:  Verne Carrow, MDDATE OF BIRTH:  [birth year]  DATE OF ADMISSION:  01/28/2011 DATE OF DISCHARGE:  01/29/2011                              DISCHARGE SUMMARY   DISCHARGE DIAGNOSES: 1. Dyspnea on exertion.     a.     Cardiac catheterization on January 28, 2011 showing no high-      grade focal obstructive coronary artery disease, with underlying      to elevated pulmonary artery pressures probably due to left      ventricular diastolic dysfunction.     b.     CT angiogram done in March 2012, showing no evidence of      pulmonary embolism.     c.     Lasix increased to 40 mg daily. 2. Insulin-dependent diabetes. 3. Obstructive sleep apnea. 4. Hyperlipidemia.  HOSPITAL COURSE:  Alice Kemp is a 60 year old female with a past medical history pertinent for diabetes, sleep apnea, and hyperlipidemia who presented to Alice Kemp office with complaints of shortness of breath on exertion.  She has not had prolonged chest pain.  Her CT had been done and demonstrated no evidence of PE, but she remained very symptomatic.  Given her risk factors, Alice Kemp felt that left and right heart cath were best to further evaluate this.  She was brought in for this procedure yesterday, which demonstrated only some suggestion of mild diabetic changes in the mid-to-distal LAD, without any areas of high-grade coronary artery obstruction.  She had elevated pulmonary artery pressures that were felt due to left ventricular diastolic dysfunction, which felt to be in part related to underlying sleep apnea and obesity.  Her Lasix was increased to 40 mg daily with the addition of low-dose daily potassium.  The patient did well postprocedurally. Alice Kemp has seen and examined her today and feels she is stable  for discharge.  DISCHARGE LABS:  WBC 10.1, hemoglobin 12.6, hematocrit 38.3, platelet count 246.  Sodium 139, potassium 4.4, chloride 102, CO2 20, glucose 196, BUN 13, creatinine 1.03.  STUDIES:  Cardiac catheterization on January 28, 2011, please see full report for details.  DISCHARGE MEDICATIONS: 1. Potassium chloride 20 mEq daily. 2. Lasix 20 mg two tablets daily. 3. Crestor 40 mg daily. 4. Metformin 1000 mg b.i.d. with instructions not to start until January 31, 2011. 5. NovoLog Mix 70/30 75 units in the morning, 50 at lunch, and 75 at     night. 6. Aleve 220 mg two tablets daily as needed. 7. Advil 200 mg four tablets daily as needed.  Please note that the patient was instructed not to take Advil and Aleve at the same time because they are in the same cause of medicine, avoid these for two days after catheterization.  She was also taking two to four tablets of Aleve at a time, was instructed that the maximum dose of no more than two tablets at a time and no  more than twice a day.  DISPOSITION:  Alice Kemp will be discharged in stable condition to home. She may return to work on February 03, 2011 per Alice Kemp.  She is not to lift anything over 5 pounds for 1 week, drive for 2 days, or participate in sexual activity for 1 week.  She should follow a low- sodium, heart-healthy, diabetic diet and to call or return if she notices any pain, swelling, bleeding, or pus at her cath site.  She will follow up with BMET at Alice Kemp office on Feb 05, 2011 and see Alice Kemp in followup on Feb 11, 2011 at 10:15 a.m.  DURATION OF DISCHARGE ENCOUNTER:  Greater than 30 minutes including physician and PA time.     Ronie Spies, P.A.C.   ______________________________ Verne Carrow, MD    DD/MEDQ  D:  01/29/2011  T:  01/29/2011  Job:  [id number]  cc:   Alice Morton. Riley Kill, MD, [facility] Alice Share, MD,FCCP  Electronically Signed by Verne Carrow MD on 01/30/2011  08:27:11 AM MedRecNo: [id number] [facility], Account: [id number], DocSeq: [id number] Addendum to discharge medications: Pt was discharged with instructions to take 1 81mg  baby aspirin daily. Electronically Signed by Ronie Spies  on 02/04/2011 04:35:20 PM

## 2011-01-30 NOTE — Telephone Encounter (Signed)
Left message for pt to call back  °

## 2011-01-31 NOTE — Telephone Encounter (Signed)
Pt rtn call from yesterday please call  [phone] pt will be on cell after 9:30 cell [phone]

## 2011-02-02 ENCOUNTER — Encounter: Payer: Self-pay | Admitting: Pulmonary Disease

## 2011-02-03 ENCOUNTER — Encounter: Payer: Self-pay | Admitting: Cardiology

## 2011-02-03 NOTE — Telephone Encounter (Signed)
This encounter was created in error - please disregard.

## 2011-02-03 NOTE — Telephone Encounter (Addendum)
Pt would like to speak with Thai Hemrick. Pt only wants to talk to the nurse. Per note from [place] (she opened another phone note). [phone].  I called and left the pt a message to call back.  Message from 01/31/11 was sent to my in basket and this was my scheduled day off.

## 2011-02-03 NOTE — Telephone Encounter (Signed)
I spoke with the pt and scheduled her to see Dr Riley Kill on 02/05/11.  The pt will increase her furosemide to 60mg  daily.  The pt will have a BMP the day of Dr Riley Kill appointment.

## 2011-02-03 NOTE — Telephone Encounter (Signed)
Pt would like to speak with lauren. Pt only wants to talk to the nurse.

## 2011-02-04 ENCOUNTER — Telehealth: Payer: Self-pay | Admitting: Cardiology

## 2011-02-04 ENCOUNTER — Encounter: Payer: Self-pay | Admitting: Gastroenterology

## 2011-02-05 ENCOUNTER — Encounter: Payer: Self-pay | Admitting: Cardiology

## 2011-02-05 ENCOUNTER — Ambulatory Visit (INDEPENDENT_AMBULATORY_CARE_PROVIDER_SITE_OTHER): Payer: 59 | Admitting: Cardiology

## 2011-02-05 ENCOUNTER — Other Ambulatory Visit (INDEPENDENT_AMBULATORY_CARE_PROVIDER_SITE_OTHER): Payer: 59 | Admitting: *Deleted

## 2011-02-05 VITALS — BP 128/80 | HR 109 | Resp 20 | Ht 64.0 in | Wt 299.8 lb

## 2011-02-05 DIAGNOSIS — G4733 Obstructive sleep apnea (adult) (pediatric): Secondary | ICD-10-CM

## 2011-02-05 DIAGNOSIS — R600 Localized edema: Secondary | ICD-10-CM

## 2011-02-05 DIAGNOSIS — I319 Disease of pericardium, unspecified: Secondary | ICD-10-CM

## 2011-02-05 DIAGNOSIS — R609 Edema, unspecified: Secondary | ICD-10-CM

## 2011-02-05 DIAGNOSIS — R072 Precordial pain: Secondary | ICD-10-CM

## 2011-02-05 DIAGNOSIS — I5030 Unspecified diastolic (congestive) heart failure: Secondary | ICD-10-CM

## 2011-02-05 LAB — BASIC METABOLIC PANEL
BUN: 17 mg/dL (ref 6–23)
Chloride: 100 mEq/L (ref 96–112)
GFR: 45.53 mL/min — ABNORMAL LOW (ref >60.00)
Potassium: 4.1 mEq/L (ref 3.5–5.1)
Sodium: 139 mEq/L (ref 135–145)

## 2011-02-05 NOTE — Patient Instructions (Signed)
Your physician recommends that you schedule a follow-up appointment in: 1-2 months with Dr. Riley Kill  Your physician has requested that you have a lower  extremity venous duplex. This test is an ultrasound of the veins in the legs. It looks at venous blood flow that carries blood from the heart to the legs or arms. Allow one hour for a Lower Venous exam. Allow thirty minutes for an Upper Venous exam. There are no restrictions or special instructions.

## 2011-02-06 ENCOUNTER — Telehealth: Payer: Self-pay | Admitting: Pulmonary Disease

## 2011-02-06 DIAGNOSIS — G4733 Obstructive sleep apnea (adult) (pediatric): Secondary | ICD-10-CM

## 2011-02-06 NOTE — Telephone Encounter (Signed)
Pt states she is leaning towards getting an oral appliance unless she can find a mask that doesn't make her fell like she can't breathe. She is claustrophobic. Pls advise.

## 2011-02-07 NOTE — Telephone Encounter (Signed)
I think the only way she will know whether she can wear cpap or not is to jump in and try it.  I would get her mask fitted at sleep center during day to help with desensitization before she even got machine.  See how she feels about this.

## 2011-02-07 NOTE — Telephone Encounter (Signed)
[id number] x1 

## 2011-02-07 NOTE — Telephone Encounter (Signed)
Spoke with pt and notified of recs per [facility].  Pt verbalized understanding and agrees to go to sleep center for mask fitting. Will send order to [facility].

## 2011-02-11 ENCOUNTER — Encounter: Payer: 59 | Admitting: Cardiology

## 2011-02-12 ENCOUNTER — Ambulatory Visit (INDEPENDENT_AMBULATORY_CARE_PROVIDER_SITE_OTHER): Payer: 59 | Admitting: Internal Medicine

## 2011-02-12 ENCOUNTER — Encounter: Payer: Self-pay | Admitting: Internal Medicine

## 2011-02-12 DIAGNOSIS — E039 Hypothyroidism, unspecified: Secondary | ICD-10-CM

## 2011-02-12 DIAGNOSIS — G4733 Obstructive sleep apnea (adult) (pediatric): Secondary | ICD-10-CM

## 2011-02-12 DIAGNOSIS — R0602 Shortness of breath: Secondary | ICD-10-CM

## 2011-02-12 DIAGNOSIS — E119 Type 2 diabetes mellitus without complications: Secondary | ICD-10-CM

## 2011-02-12 NOTE — Patient Instructions (Signed)
It was good to see you today. Medications reviewed, no changes at this time. We have also reviewed your prior records including labs and tests today. Continue to work with your other specialists as discussed and [facility] -  Work on lifestyle changes as discussed (low fat, low carb, increased protein diet; improved exercise efforts; weight loss) to control sugar, blood pressure and cholesterol levels and/or reduce risk of developing other medical problems. Look into [email or web address] or other type of food journal to assist you in this process. Or bariatric meeting as reviewed Please schedule followup in 6 months, call sooner if problems.

## 2011-02-13 NOTE — Assessment & Plan Note (Signed)
Mechanisms unclear.  Minimal changes, no critical CAD.  Would favor regular exercise program.

## 2011-02-13 NOTE — Progress Notes (Signed)
HPI:  Alice Kemp is in for follow up of her various studies.  She continues to have a lot of fatigue.  She saw Dr. Shelle Iron and did not like his recommendations.  Her parents tell me she works to the exclusion of her health, although I think her perspective is a bit different.  Her parents are well known to me.  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  We had a thorough discussion today about the findings.  We reviewed them in detail.   January 28, 2011  Cardiac cath  --site is ok. CONCLUSION:   1. Elevated pulmonary artery pressures probably due to left       ventricular diastolic dysfunction.   2. Diastolic dysfunction may in part be related to underlying sleep       apnea and obesity.   3. No high-grade significant focal obstructive coronary artery       disease.      DISPOSITION:  The patient will follow up with me in the office.  Gentle   diuresis will be recommended.  Sleep Study;  Feb 28 IMPRESSIONS-RECOMMENDATIONS:   1. Moderate obstructive sleep apnea/hypopnea syndrome with an       apnea/hypopnea index of 25 events per hour and O2 desaturation as       low as 68%.  Treatment for this degree of sleep apnea can include a       trial of weight loss alone, upper airway surgery, dental appliance,       and also CPAP.  The patient had great difficulty with the concept       of CPAP, however, may respond to aggressive desensitization       procedures within her own home.       Clinical correlation is suggested.   2. Rare PAC noted, but no clinically significant arrhythmias were       Seen.  Echo  APril 17,2012 Study Conclusions    - Left ventricle: The cavity size was normal. Wall thickness was     increased in a pattern of mild LVH. Systolic function was normal.     The estimated ejection fraction was in the range of 60% to 65%.     Wall motion was normal; there were no regional wall motion     abnormalities.  Doppler parameters are consistent with abnormal     left ventricular relaxation (grade 1 diastolic dysfunction).   - Left atrium: The atrium was mildly dilated.   - Pericardium, extracardiac: A Fidalgo pericardial effusion was     identified.   Impressions:    - Esau pericardial effusion; Mild RA collapse.  IMPRESSION:    1.  No evidence of acute pulmonary embolism.   2.  No acute pulmonary parenchymal abnormality.   3.  Casaus pericardial effusion.    Original Report Authenticated By: Genevive Bi, M.D.  I called Crenshaw who reviewed.  He did not think RA was significant.  CT angio:  December 05, 2010  I had an extensive discussion with her today regarding the findings.  I think she would be served enrolling in a rehab program with exercise component, weight loss program, and told her that I thought cumulatively her weight, OSA, and diastolic dysfunction had much to do with findings.  There is no sig evidence of severe CAD, dissection, or PE.  Current Outpatient Prescriptions  Medication Sig Dispense Refill  . aspirin 81 MG tablet Take 81 mg by mouth daily.        . insulin aspart protamine-insulin aspart (NOVOLOG 70/30) (70-30) 100 UNIT/ML injection Inject into the skin. Sliding scale-(75-50-75)As directed      . levothyroxine (SYNTHROID) 25 MCG tablet Take 25 mcg by mouth daily.        . metFORMIN (GLUCOPHAGE) 1000 MG tablet Take 1,000 mg by mouth 2 (two) times daily with a meal.        . naproxen sodium (ANAPROX) 220 MG tablet Take 220 mg by mouth as needed.       Marland Kitchen POTASSIUM CITRATE PO Take 20 mEq by mouth daily.       . rosuvastatin (CRESTOR) 20 MG tablet Take 20 mg by mouth daily.          Allergies  Allergen Reactions  . Penicillins     Past Medical History  Diagnosis Date  . Diabetes mellitus     metformin  . Chest pain   . SOB (shortness of breath)   . OSA (obstructive sleep apnea)   . Hyperlipidemia     Past Surgical History  Procedure  Date  . Shoulder surgery 2010    Left  . Tonsillectomy 1964  . Breast biopsy 1990    Left    Family History  Problem Relation Age of Onset  . Hypertension Mother   . Diabetes Father   . Hypertension Brother   . Heart disease Mother   . Heart disease Father   . Skin cancer Father   . Skin cancer Paternal Grandfather   . Breast cancer Paternal Aunt     History   Social History  . Marital Status: Single    Spouse Name: N/A    Number of Children: 0  . Years of Education: N/A   Occupational History  . 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 Smoking status: Never Smoker   . Smokeless tobacco: Not on file  . Alcohol Use: No  . Drug Use: No  . Sexually Active: Not on file   Other Topics Concern  . Not on file   Social History Narrative   Lives alone.    ROS: Please see the HPI.  All other systems reviewed and negative.  PHYSICAL EXAM:  BP 128/80  Pulse 109  Resp 20  Ht 5\' 4"  (1.626 m)  Wt 299 lb 12.8 oz (135.988 kg)  BMI 51.46 kg/m2  General: Well developed, morbidly obese by definition  (BMI 51) , in no acute distress. Head:  Normocephalic and atraumatic. Neck: no JVD Lungs: Clear to auscultation and percussion. Heart: Normal S1 and S2.  No murmur, rubs or gallops.  Abdomen:  Normal bowel sounds; soft; non tender; no organomegaly.  Still has burns from hair curler.   Pulses: Pulses normal in all 4 extremities.  No pulsus on exam  Extremities: No clubbing or cyanosis. Trace edema.  Neurologic: Alert and oriented x 3.  EKG:  Sinus tachycardia with PACs.  ASSESSMENT AND PLAN:

## 2011-02-13 NOTE — Assessment & Plan Note (Addendum)
Prob from diastolic changes associated with OSA.  Mild elevation of pressures. Continue to monitor.   Will check venous dopplers to exclude obstruction, but doubt.

## 2011-02-13 NOTE — Assessment & Plan Note (Signed)
Strongly encouraged her to enter weight loss program, and also to consider treatment with the pulmonary team

## 2011-02-13 NOTE — Assessment & Plan Note (Signed)
Does not appear significant.  Seen by echo and CT.  Crenshaw called to rereview and case discussed.

## 2011-02-14 ENCOUNTER — Encounter (INDEPENDENT_AMBULATORY_CARE_PROVIDER_SITE_OTHER): Payer: 59 | Admitting: Cardiology

## 2011-02-14 ENCOUNTER — Encounter: Payer: Self-pay | Admitting: Internal Medicine

## 2011-02-14 DIAGNOSIS — R609 Edema, unspecified: Secondary | ICD-10-CM

## 2011-02-14 DIAGNOSIS — R600 Localized edema: Secondary | ICD-10-CM

## 2011-02-14 DIAGNOSIS — L97909 Non-pressure chronic ulcer of unspecified part of unspecified lower leg with unspecified severity: Secondary | ICD-10-CM

## 2011-02-14 DIAGNOSIS — E039 Hypothyroidism, unspecified: Secondary | ICD-10-CM | POA: Insufficient documentation

## 2011-02-14 NOTE — Progress Notes (Signed)
Subjective:    Patient ID: Alice Kemp, female    DOB: [birth year], 60 y.o.   MRN: [id number]  HPI New pt to me and our division, here to establish care Known to our pulm and cardioogy divisions in [place]  Reviewed chronic medical issues today  Morbid obesity - several associated with health issues including DM2, OSA with pulm HTN - reports ongoing weight gain but denies change in intake or physical exertion efforts  OSA - dx on sleep study 12/03/10 as moderate - intolerant of CPAP trials to date -   DM2 - follows with endo for same - the patient reports compliance with medication(s) as prescribed. Denies adverse side effects.  Hypothyroid - dx same 01/2011 and on new meds - managed by endo - no bowel or skin changes but ongoing fatigue and wt gain as noted  Recent hosp 01/2011 dc summary reviewed:  1. Dyspnea on exertion.       a.     Cardiac catheterization on January 28, 2011 showing no high-        grade focal obstructive coronary artery disease, with underlying        to elevated pulmonary artery pressures probably due to left        ventricular diastolic dysfunction.       b.     CT angiogram done in March 2012, showing no evidence of        pulmonary embolism.       c.     Lasix increased to 40 mg daily.   2. Insulin-dependent diabetes.   3. Obstructive sleep apnea.   4. Hyperlipidemia Past Medical History  Diagnosis Date  . SOB (shortness of breath)   . OSA (obstructive sleep apnea)   . Hyperlipidemia   . Diabetes mellitus type II     metformin  . Pulmonary hypertension   . Unspecified hypothyroidism      Review of Systems  Constitutional: Positive for fatigue.  Respiratory: Positive for shortness of breath.   Cardiovascular: Negative for chest pain.  Gastrointestinal: Negative for abdominal pain.  Musculoskeletal: Negative for joint swelling.  Neurological: Negative for headaches.       Objective:   Physical Exam BP 111/60  Pulse 111  Temp(Src) 98.3 F (36.8  C) (Oral)  Ht 5\' 4"  (1.626 m)  Wt 302 lb 12.8 oz (137.349 kg)  BMI 51.98 kg/m2  SpO2 97% Physical Exam  Constitutional: She is morbidly obese; oriented to person, place, and time. She appears well-developed and well-nourished. No distress.  Eyes: Conjunctivae and EOM are normal. Pupils are equal, round, and reactive to light. No scleral icterus.  Neck: Thick. Normal range of motion. Neck supple. No JVD present. No thyromegaly present.  Cardiovascular: Normal rate, regular rhythm and normal heart sounds.  No murmur heard. chronic L>R LE edema (trace) Pulmonary/Chest: Effort normal and breath sounds normal. No respiratory distress. She has no wheezes.  Neurological: She is alert and oriented to person, place, and time. No cranial nerve deficit. Coordination normal.  Skin: Skin is warm and dry. No rash noted. No erythema.  Psychiatric: She has a normal mood and affect. Her behavior is normal. Judgment and thought content normal.   Lab Results  Component Value Date   WBC 10.1 01/29/2011   HGB 12.6 01/29/2011   HCT 38.3 01/29/2011   PLT 246 01/29/2011   ALT 14 12/03/2010   AST 17 12/03/2010   NA 139 02/05/2011   K 4.1 02/05/2011   CL  100 02/05/2011   CREATININE 1.3* 02/05/2011   BUN 17 02/05/2011   CO2 29 02/05/2011   TSH 2.39 12/03/2010   INR 0.98 01/28/2011        Assessment & Plan:  See problem list. Medications and labs reviewed today. Time spent with pt today 40 minutes, greater than 50% time spent counseling patient on obesity and effect on health as related to dyspnea; also diabetes, OSA, hypothyroid and medication review. Also review of prior records including recent hospitalization

## 2011-02-14 NOTE — Assessment & Plan Note (Signed)
New dx 01/2011 - follows with endo for same The current medical regimen is effective;  continue present plan and medications.

## 2011-02-14 NOTE — Assessment & Plan Note (Signed)
Reviewed as primary contributing risk to health issues and symptoms Discussed options for weight loss including bariatric intervention -  Pt to meet with employer program (med link) and will consider same Wt Readings from Last 3 Encounters:  02/12/11 302 lb 12.8 oz (137.349 kg)  02/05/11 299 lb 12.8 oz (135.988 kg)  01/27/11 300 lb (136.079 kg)

## 2011-02-14 NOTE — Assessment & Plan Note (Signed)
Dx 11/2010 on sleep study - intolerant of CPAP trials thus far - working with pulm to try again

## 2011-02-14 NOTE — Assessment & Plan Note (Signed)
Follows with endo (balan) for same The current medical regimen is effective;  continue present plan and medications.

## 2011-02-14 NOTE — Assessment & Plan Note (Signed)
Suspect multifactorial - R/L heart cath 01/2011 reviewed To continue working with pulm and cards on same as well as weight loss efforts - no med change today

## 2011-02-18 NOTE — Discharge Summary (Signed)
Alice Kemp, Alice Kemp                ACCOUNT NO.:  [id number]   MEDICAL RECORD NO.:  [id number]          PATIENT TYPE:  INP   LOCATION:  5019                         FACILITY:  [facility]   PHYSICIAN:  Madelynn Done, MD  DATE OF BIRTH:  08-20-1951   DATE OF ADMISSION:  01/17/2009  DATE OF DISCHARGE:  01/19/2009                               DISCHARGE SUMMARY   ADMISSION DIAGNOSES:  1. Left shoulder proximal humerus fracture.  2. Diabetes.  3. Obesity.   DISCHARGE DIAGNOSES:  1. Left shoulder proximal humerus fracture.  2. Diabetes.  3. Obesity.   PROCEDURES AND DATES:  Open reduction and internal fixation, left  shoulder on January 17, 2009.   DISCHARGE MEDICATIONS:  1. Vicodin 7/500.  2. Colace 100 mg p.o. b.i.d.  3. Robaxin 500 mg p.o. q.6 h. as needed for pain.   BRIEF HISTORY:  Alice Kemp is a 59 year old right-hand-dominant female  who sustained an injury to her left proximal humerus.  The patient was  admitted to undergo the above procedure.   BRIEF HISTORY:  The patient was admitted to the Orthopedic Floor  following her above procedure.  The patient tolerated under general  anesthesia.  The patient was slow secondary to pain control and getting  up in terms of using her left upper extremity for assistant with ADLs.  PT and OT were consulted.  The patient was seen and examined on  postoperative day #2 and once her pain was controlled, she was  tolerating regular diet, voiding well, and pain was controlled on oral  pain medications, and felt ready to be discharged to home on January 19, 2009.   RECOMMENDATIONS AND DISPOSITION:  The patient will be discharged home.  She will be seen back in the office in a week for wound check and  radiographs.  She is to continue with the sling at all times while using  the left upper extremity.  She is to continue with the above discharge  medications.  All questions were answered and encouraged prior to  discharge.  The patient voiced  understanding of the discharge plan and  instructions.      Madelynn Done, MD  Electronically Signed     FWO/MEDQ  D:  03/12/2009  T:  03/13/2009  Job:  [id number]

## 2011-02-18 NOTE — Op Note (Signed)
NAME:  Biswas, [facility]                ACCOUNT NO.:  [id number]   MEDICAL RECORD NO.:  [id number]          PATIENT TYPE:  OIB   LOCATION:  5019                         FACILITY:  [facility]   PHYSICIAN:  Madelynn Done, MD  DATE OF BIRTH:  02-18-1951   DATE OF PROCEDURE:  01/17/2009  DATE OF DISCHARGE:                               OPERATIVE REPORT   PREOPERATIVE DIAGNOSES:  1. Left proximal humerus fracture, 3-part proximal humerus fracture.  2. Morbid obesity.  3. Diabetes.   POSTOPERATIVE DIAGNOSES:  1. Left proximal humerus fracture, 3-part proximal humerus fracture.  2. Morbid obesity.  3. Diabetes.   ATTENDING SURGEON:  Sharma Covert IV, MD, who was scrubbed and present  for the entire procedure.   ASSISTANT SURGEON:  Almedia Balls. Norris, M.D, who was scrubbed and present  for the key portion of the procedure.   SURGICAL PROCEDURE:  1. Open treatment of left proximal humerus fracture, 3-part proximal      humerus fracture with internal fixation.  2. Radiographs of 3 views, left shoulder.   SURGICAL IMPLANTS/HAND INNOVATIONS:  SNP nail plate, left shoulder with  5 screws into the head and 2 unicortical screws distally.   SURGICAL ANESTHESIA:  General via endotracheal tube.   SURGICAL INDICATIONS:  Ms. Karg is a 60 year old right-hand dominant  female who sustained a fall onto her left shoulder.  The patient  sustained a comminuted 3-part proximal humerus fracture.  It was  recommended the patient undergo the above procedure, given her age and  displacement of the fracture.  Risks, benefits, and alternatives were  discussed in detail with the patient and signed informed consent was  obtained.   Risks include but not limited to bleeding, infection, damage to nearby  nerves; arteries; or tendons, nonunion, malunion, hardware failure; loss  of the motion of the wrist; elbow; and shoulder, and need for further  surgical intervention.  All questions were addressed   preoperatively.   DESCRIPTION OF THE PROCEDURE:  The patient was properly identified in  the preoperative holding area and marked with a permanent mark made on  the left shoulder to indicate the correct operative site.  The patient  was then brought back to the operating room, placed supine on the  anesthesia room table.  General anesthesia was administered via  endotracheal tube.  The patient tolerated this well.  The patient was  placed in a beach-chair position and all pressure points were well-  padded.  SCDs were then applied throughout the entire case.  The  patient's all pressure points were well-padded, the neck was protected.  After appropriate positioning, the left upper extremity was then sealed  with two 1000 drapes.  Left upper extremity was prepped with DuraPrep  and then sterilely draped.  A time out was called, the correct site was  identified, and the procedure was then begun.  A standard deltopectoral  approach was then used in the shoulder.  Dissection was carried down  through the skin and subcutaneous tissue.  The interval between the  deltoid and the pectoralis was then identified.  Blunt  dissection was  carried down through the clavipectoral fascia where the fascia was  incised.  The rotator interval was then identified.  The biceps tendon  was identified and retracted.  Going lateral through the biceps tendon,  the greater and the lesser tuberosity fracture fragments were then  identified.  The lesser tuberosity was in continuity with the head.  The  greater tuberosity was split.  Using #2 FiberWire suture, both heavy  stout sutures were then placed in both tendons to mobilize the  respective supraspinatus and infraspinatus.  These FiberWire sutures  were then tagged and left for later repair.  Several of these FiberWire  sutures were then placed into both the subscapularis as well as infra-  and supraspinatus.  After identification and tagging of the tuberosity,   the attention was then turned to open reduction of the head to the  shaft.  The  SNP plate was then placed down the shaft of the humeral  canal.  Its plate height was then adjusted based on the radiographs.  The center position was then placed with a K-wire.  Its position was  confirmed.  The plate was noted to be slightly just anterior.  It was  felt to be in an acceptable position.  With the plate in place, the 5  locking tags were then placed in the proximal segment into the head.  Their positions were then confirmed using the mini C-arm.  These were  appropriate.  The near cortex was drilled and then appropriate hand  drilling into the head and the appropriate size locking pegs were then  placed into the head.  Following this, the appropriate guide was then  placed distally.  The bicortical locking screws were then placed and  engaging the implant.  After fixation distally, the shoulder was placed  through a range of motion felt to be in adequate purchase into the head  as well as into the shaft and the segment moving as one unit.  Following  this, the tuberosities were then repaired bringing the suture together  enclosing the rotator interval with a FiberWire suture.  Following this,  the wounds were then thoroughly irrigated.  Hemostasis was obtained with  bipolar cautery and electrocautery.  The interval between the  deltopectoral fascia were then closed with 0 Vicryl.  Subcutaneous  tissues were closed with 2-0 Vicryl and the skin closed with skin  staples.  A sterile Adaptic dressing and sterile compressive bandage was  then applied.  The patient tolerated the procedure well and was  extubated and taken to the recovery room in good condition.   POSTOPERATIVE PLAN:  The patient will be admitted for IV antibiotics and  pain control.  She will be discharged when these are controlled.  She  will be seen back in the office in 2 weeks for wound check and staple  removal.  X-rays total  of 4 weeks, no motion of the shoulder given the  nature of the fracture, but again motion at around the 4-week mark.  Radiographs at the 2-week mark and 4-week mark and we will continue to  follow her closely given the fixation.      Madelynn Done, MD  Electronically Signed     FWO/MEDQ  D:  01/17/2009  T:  01/18/2009  Job:  [id number]

## 2011-02-19 ENCOUNTER — Encounter: Payer: Self-pay | Admitting: Cardiology

## 2011-03-06 NOTE — Cardiovascular Report (Signed)
NAME:  Risdon, [facility]                ACCOUNT NO.:  [id number]  MEDICAL RECORD NO.:  [id number]           PATIENT TYPE:  O  LOCATION:  6533                         FACILITY:  [facility]  PHYSICIAN:  Arturo Morton. 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  She has had increasing shortness of breath as well as some mild chest tightness as of late.  She has a Silvers pericardial effusion, but preserved left ventricular function.  She does have a significant weight issues, the recent sleep study was concerning for some component of sleep apnea.  She has continued to have symptoms.  CT ruled out pulmonary embolus, 2D echo was as noted.  After consideration of the options, we recommended cardiac catheterization.  Her parents are both patients of mine, and both who have had obstructive coronary disease for a number of years.  The patient herself has diabetes mellitus and is on insulin.  Given the findings, her age, her underlying diabetes, it was felt that cardiac catheterization was indicated.  PROCEDURES: 1. Right heart catheterization. 2. Left heart catheterization. 3. Selective coronary arteriography.  DESCRIPTION OF PROCEDURE:  The patient was brought to the catheterization laboratory and prepped and draped in usual fashion.  We used a Smart needle to enter the right femoral artery.  A 4-French sheath was placed.  Views of the right and left coronary arteries were obtained in multiple angiographic projections.  Central aortic and left ventricular pressures were measured with pigtail.  We were unable to perform ventriculography because of a power injector malfunction. Following this, a Smart needle was used to gain access to the right femoral vein.  The right femoral vein was entered and a sheath was placed.  The  right heart catheter, specifically a 7.5-French thermodilution Swan-Ganz catheter was placed up into the superior vena cava where saturation was obtained.  Serial pressures were recorded. Simultaneous wedge and LV were then recorded.  Thermodilution cardiac outputs were performed.  Again, we did not do ventriculography because of the second malfunction of the power injector.  We then decided to abandon this based on the known LV function by echocardiography.  All catheters were subsequently removed.  The patient was taken the holding area in satisfactory clinical condition.  HEMODYNAMIC DATA: 1. Right atrial pressure 19. 2. RV 44/18. 3. Coronary artery 41/26, mean 33. 4. Pulmonary capillary wedge 24. 5. Aortic 136/84, mean 102. 6. LV 144/26. 7. Superior vena cava saturation 56%. 8. Pulmonary artery saturation 58%. 9. Aortic saturation 94%. 10.Fick cardiac output 4.33 liters per minute. 11.Fick cardiac index 1.82 liters per minute per meter squared. 12.Thermodilution cardiac output 6.4 liters per minute. 13.Thermodilution cardiac index 2.75 liters per minute per meter     squared.  ANGIOGRAPHIC DATA: 1. The right coronary artery is a large-caliber vessel that is at     least 4 mm in size that provides a posterior descending and     posterolateral branch.  All of the vessels  appear free of disease. 2. The left main is free of disease. 3. The LAD courses to the apex.  There is some tapering distally with     some suggestion of mild diabetic changes in the mid to distal     vessel.  However, no areas of high-grade obstruction are noted. 4. The circumflex is a large-caliber vessel with free of critical     disease.  CONCLUSION: 1. Elevated pulmonary artery pressures probably due to left     ventricular diastolic dysfunction. 2. Diastolic dysfunction may in part be related to underlying sleep     apnea and obesity. 3. No high-grade significant focal obstructive coronary artery      disease.  DISPOSITION:  The patient will follow up with me in the office.  Gentle diuresis will be recommended.     Arturo Morton. Riley Kill, MD, [facility]     TDS/MEDQ  D:  01/28/2011  T:  01/29/2011  Job:  [id number]  Electronically Signed by Shawnie Pons MD [facility] on 03/06/2011 09:15:38 AM

## 2011-03-26 ENCOUNTER — Ambulatory Visit: Payer: 59 | Admitting: Cardiology

## 2011-03-26 ENCOUNTER — Ambulatory Visit: Payer: 59 | Admitting: Cardiovascular Disease

## 2011-03-27 ENCOUNTER — Telehealth: Payer: Self-pay | Admitting: Cardiology

## 2011-03-27 NOTE — Telephone Encounter (Signed)
Patient wanted to know what her cholesterol is. I do not see any fasting labs in EPIC, EMR, or ECHART. Patient aware.

## 2011-03-27 NOTE — Telephone Encounter (Signed)
Test results for cholesterol

## 2011-06-26 ENCOUNTER — Ambulatory Visit: Payer: 59 | Admitting: Cardiology

## 2011-07-07 DIAGNOSIS — N611 Abscess of the breast and nipple: Secondary | ICD-10-CM

## 2011-07-07 HISTORY — DX: Abscess of the breast and nipple: N61.1

## 2011-07-14 ENCOUNTER — Ambulatory Visit (INDEPENDENT_AMBULATORY_CARE_PROVIDER_SITE_OTHER): Payer: Commercial Managed Care - PPO | Admitting: General Surgery

## 2011-07-14 ENCOUNTER — Encounter (INDEPENDENT_AMBULATORY_CARE_PROVIDER_SITE_OTHER): Payer: Self-pay | Admitting: General Surgery

## 2011-07-14 ENCOUNTER — Encounter: Payer: Self-pay | Admitting: Internal Medicine

## 2011-07-14 ENCOUNTER — Ambulatory Visit (INDEPENDENT_AMBULATORY_CARE_PROVIDER_SITE_OTHER): Payer: 59 | Admitting: Internal Medicine

## 2011-07-14 VITALS — BP 118/68 | HR 105 | Temp 98.8°F | Ht 64.0 in | Wt 299.8 lb

## 2011-07-14 VITALS — BP 120/82 | HR 82 | Temp 97.8°F | Resp 14 | Ht 64.0 in | Wt 299.0 lb

## 2011-07-14 DIAGNOSIS — N61 Mastitis without abscess: Secondary | ICD-10-CM

## 2011-07-14 DIAGNOSIS — S21009A Unspecified open wound of unspecified breast, initial encounter: Secondary | ICD-10-CM

## 2011-07-14 DIAGNOSIS — N611 Abscess of the breast and nipple: Secondary | ICD-10-CM | POA: Insufficient documentation

## 2011-07-14 MED ORDER — SULFAMETHOXAZOLE-TRIMETHOPRIM 800-160 MG PO TABS: 1.0000 | ORAL_TABLET | Freq: Two times a day (BID) | ORAL | Status: AC

## 2011-07-14 NOTE — Progress Notes (Signed)
  Subjective:    Patient ID: Alice Kemp, female    DOB: [birth year], 60 y.o.   MRN: [id number]  HPI  patient is here today for ?weight loss pill  Also "pimple on L breast - Onset 1 week ago - squeezed same - now worse - draining, pus and tender No fever, no recent mammo - no hx same, no other wounds   Also reviewed chronic medical issues today  Morbid obesity - several associated with health issues including DM2, OSA with pulm HTN - reports ongoing weight gain but denies change in intake or physical exertion efforts - see above re: pill?  OSA - dx on sleep study 12/03/10 as moderate - intolerant of CPAP trials to date - working with pulm/sleep on same  DM2 - follows with endo for same - the patient reports compliance with medication(s) as prescribed. Denies adverse side effects.  Hypothyroid - dx same 01/2011 - managed by endo - no bowel or skin changes but ongoing fatigue and wt gain as noted   Past Medical History  Diagnosis Date  . SOB (shortness of breath)   . OSA (obstructive sleep apnea)   . Hyperlipidemia   . Diabetes mellitus type II     metformin  . Pulmonary hypertension   . Unspecified hypothyroidism     Review of Systems  Constitutional: Negative for fever and unexpected weight change.  Respiratory: Positive for shortness of breath.   Cardiovascular: Negative for chest pain and palpitations.  Skin: Positive for wound.  Neurological: Negative for headaches.     Objective:   Physical Exam  BP 118/68  Pulse 105  Temp(Src) 98.8 F (37.1 C) (Oral)  Ht 5\' 4"  (1.626 m)  Wt 299 lb 12.8 oz (135.988 kg)  BMI 51.46 kg/m2  SpO2 95%   Wt Readings from Last 3 Encounters:  07/14/11 299 lb 12.8 oz (135.988 kg)  02/12/11 302 lb 12.8 oz (137.349 kg)  02/05/11 299 lb 12.8 oz (135.988 kg)   Constitutional: She is morbidly obese; She appears well-developed and well-nourished. No distress.  Neck: Thick. Normal range of motion. Neck supple. No JVD present. No  thyromegaly present.  Cardiovascular: Normal rate, regular rhythm and normal heart sounds.  No murmur heard. chronic L>R LE edema (trace) Pulmonary/Chest: Effort normal and breath sounds normal. No respiratory distress. She has no wheezes.  Skin: L breast with large wound - 6cm diameter thickened deep erythema/cellulitis with 1cm purulent ulcer - +fluctuence Psychiatric: She has a normal mood and affect. Her behavior is normal. Judgment and thought content normal.   Lab Results  Component Value Date   WBC 10.1 01/29/2011   HGB 12.6 01/29/2011   HCT 38.3 01/29/2011   PLT 246 01/29/2011   ALT 14 12/03/2010   AST 17 12/03/2010   NA 139 02/05/2011   K 4.1 02/05/2011   CL 100 02/05/2011   CREATININE 1.3* 02/05/2011   BUN 17 02/05/2011   CO2 29 02/05/2011   TSH 2.39 12/03/2010   INR 0.98 01/28/2011       Assessment & Plan:   L breast wound - suspect abscess vs other - Septra rx provided but needs urgent eval by gen surg for same - also reminded of need for mammo - to sched same and CPX  Also see problem list. Medications and labs reviewed today.

## 2011-07-14 NOTE — Patient Instructions (Signed)
It was good to see you today. Paper prescription for weight loss pill Qsymia provided to you for 30 days - will need to start AFTER breast issues reolved - and will need office visit for side effects review and weight check before refills given Septra antibiotics for breast infection and we'll make referral to surgery for same. Our office will contact you regarding appointment(s) once made. Please schedule followup in 1-2 months for physical and labs, call sooner if problems.

## 2011-07-14 NOTE — Assessment & Plan Note (Signed)
Reviewed as primary contributing risk to health issues and symptoms Discussed options for weight loss including bariatric intervention - Request trail of new FDA meds - Belvia and Qsymia -  written rx for 30d Qsymia provided but not to start until after acute breat infection issues resolve Pt to meet with employer program (med link) and will look into gym/trainer etc Wt Readings from Last 3 Encounters:  07/14/11 299 lb 12.8 oz (135.988 kg)  02/12/11 302 lb 12.8 oz (137.349 kg)  02/05/11 299 lb 12.8 oz (135.988 kg)

## 2011-07-14 NOTE — Progress Notes (Signed)
Subjective:     Patient ID: Alice Kemp, female   DOB: August 11, 1951, 60 y.o.   MRN: [id number]  HPI We are asked to see the patient in consultation by Dr. Rene Paci to evaluate her for a left breast abscess. The patient is a 60 year old white female who noticed a pimple on her left breast about 2 weeks ago. She tried putting warm compresses on it and manipulating it. It got worse and now is draining. She denies any fevers or chills. Review of Systems  Constitutional: Negative.   HENT: Negative.   Eyes: Negative.   Respiratory: Negative.   Cardiovascular: Negative.   Gastrointestinal: Negative.   Genitourinary: Negative.   Musculoskeletal: Negative.   Skin: Negative.   Neurological: Negative.   Hematological: Negative.   Psychiatric/Behavioral: Negative.        Objective:   Physical Exam  Constitutional: She is oriented to person, place, and time. She appears well-developed and well-nourished.  HENT:  Head: Normocephalic and atraumatic.  Eyes: Conjunctivae and EOM are normal. Pupils are equal, round, and reactive to light.  Neck: Normal range of motion. Neck supple.  Cardiovascular: Normal rate, regular rhythm and normal heart sounds.   Pulmonary/Chest: Effort normal and breath sounds normal.       In the 1:00 position of the left breast the patient has an area of skin necrosis. There is surrounding cellulitis. There does not appear to be any underlying fluid pocket.  Abdominal: Soft. Bowel sounds are normal.  Musculoskeletal: Normal range of motion.  Neurological: She is alert and oriented to person, place, and time.  Skin: Skin is warm and dry.  Psychiatric: She has a normal mood and affect. Her behavior is normal.       Assessment:     The patient has an area of skin necrosis in the left breast with surrounding cellulitis. My recommendation would be to go to the operating room to debride the area and start her on IV antibiotics. She does not want to go to the  operating room tonight. She would like to try oral antibiotics. We will therefore arrange for her to see our urgent Dr. on Wednesday to see if her cellulitis is improving. If not then she'll agree to go to the OR at that time.    Plan:     Plan for followup on Wednesday.

## 2011-07-16 ENCOUNTER — Ambulatory Visit (INDEPENDENT_AMBULATORY_CARE_PROVIDER_SITE_OTHER): Payer: Commercial Managed Care - PPO | Admitting: General Surgery

## 2011-07-16 ENCOUNTER — Encounter (INDEPENDENT_AMBULATORY_CARE_PROVIDER_SITE_OTHER): Payer: Self-pay | Admitting: General Surgery

## 2011-07-16 VITALS — BP 124/84 | HR 88 | Temp 97.8°F | Resp 26 | Ht 64.0 in | Wt 299.6 lb

## 2011-07-16 DIAGNOSIS — N61 Mastitis without abscess: Secondary | ICD-10-CM

## 2011-07-16 DIAGNOSIS — N611 Abscess of the breast and nipple: Secondary | ICD-10-CM

## 2011-07-16 NOTE — Assessment & Plan Note (Signed)
Bedside I&D, anesthetized with 1% lidocaine with epinephrine.   Scab debrided, area cauterized.  Cultured drainage.   Remove dressing in 24 hours, dry dressing daily after that.   Follow up in 2 weeks.

## 2011-07-16 NOTE — Progress Notes (Signed)
Addended byLiliana Cline on: 07/16/2011 04:46 PM   Modules accepted: Orders

## 2011-07-16 NOTE — Patient Instructions (Signed)
Remove dressing in 24 hours, then may shower daily. Place dry dressing. Finish antibiotics. Call if worsening redness, swelling, or drainage.

## 2011-07-16 NOTE — Progress Notes (Signed)
HISTORY: Pt saw Dr. Carolynne Edouard 2 days ago for L breast abscess.  L breast was very firm with necrotic skin overlying.  He recommended I&D in OR due to the size of the swelling and area of necrosis.  She did not want to undergo surgery, but tried antibiotics.  She states that she is not having significant pain, and she developed spontaneous drainage from the center.  She states that the dusky area became scabbed over.    PERTINENT REVIEW OF SYSTEMS: Otherwise negative.     EXAM: Head: Normocephalic and atraumatic.  Eyes:  Conjunctivae are normal. Pupils are equal, round, and reactive to light. No scleral icterus.  Neck:  Normal range of motion. Neck supple.   Resp: No respiratory distress, normal effort. Breast:  L breast with necrotic eschar and purulent drainage in the upper outer quadrant with associated erythema.  Several scabs on the medial breast, without erythema.   Abd: Abdomen is soft, non distended and non tender No masses are palpable.  There is no rebound and no guarding.  Neurological: Alert and oriented to person, place, and time. Coordination normal.  Skin: Skin is warm and dry. No rash noted. No diaphoretic. No erythema. No pallor.  Psychiatric: Normal mood and affect. Normal behavior. Judgment and thought content normal.    ASSESSMENT AND PLAN:   Left breast abscess Bedside I&D, anesthetized with 1% lidocaine with epinephrine.   Scab debrided, area cauterized.  Cultured drainage.   Remove dressing in 24 hours, dry dressing daily after that.   Follow up in 2 weeks.       Maudry Diego, MD Surgical Oncology, General & Endocrine Surgery [facility]  Rene Paci, MD, MD Rene Paci, MD

## 2011-07-17 ENCOUNTER — Other Ambulatory Visit (INDEPENDENT_AMBULATORY_CARE_PROVIDER_SITE_OTHER): Payer: Self-pay | Admitting: General Surgery

## 2011-07-19 LAB — WOUND CULTURE
Gram Stain: NONE SEEN
Gram Stain: NONE SEEN
Gram Stain: NONE SEEN

## 2011-07-21 LAB — BASIC METABOLIC PANEL
BUN: 17 mg/dL (ref 4–21)
Glucose: 165 mg/dL
Potassium: 4.9 mmol/L (ref 3.4–5.3)
Sodium: 139 mmol/L (ref 137–147)

## 2011-07-21 LAB — HEPATIC FUNCTION PANEL
AST: 14 U/L (ref 13–35)
Bilirubin, Direct: 0.6 mg/dL — AB (ref 0.01–0.4)
Bilirubin, Total: 0.1 mg/dL

## 2011-07-21 LAB — TSH: TSH: 2.22 u[IU]/mL (ref 0.41–5.90)

## 2011-07-24 ENCOUNTER — Ambulatory Visit (INDEPENDENT_AMBULATORY_CARE_PROVIDER_SITE_OTHER): Payer: 59 | Admitting: Endocrinology

## 2011-07-24 ENCOUNTER — Encounter: Payer: Self-pay | Admitting: Endocrinology

## 2011-07-24 VITALS — BP 124/64 | HR 85 | Temp 97.5°F | Ht 64.0 in | Wt 306.0 lb

## 2011-07-24 DIAGNOSIS — M5412 Radiculopathy, cervical region: Secondary | ICD-10-CM

## 2011-07-24 MED ORDER — OXYCODONE-ACETAMINOPHEN 10-325 MG PO TABS: 1.0000 | ORAL_TABLET | ORAL | Status: DC | PRN

## 2011-07-24 NOTE — Progress Notes (Signed)
Subjective:    Patient ID: Alice Kemp, female    DOB: January 20, 1951, 60 y.o.   MRN: [id number]  HPI 2 days of moderate pain at the left shoulder blade, rad to the left hand.  She has assoc numbness.  No help with aleve. Past Medical History  Diagnosis Date  . SOB (shortness of breath)   . OSA (obstructive sleep apnea)   . Hyperlipidemia   . Diabetes mellitus type II     metformin  . Pulmonary hypertension   . Unspecified hypothyroidism   . Breast abscess     left    Past Surgical History  Procedure Date  . Shoulder surgery 2010    Left  . Tonsillectomy 1964  . Breast biopsy 1990    Left    History   Social History  . Marital Status: Single    Spouse Name: N/A    Number of Children: 0  . Years of Education: N/A   Occupational History  . 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 Smoking status: Never Smoker   . Smokeless tobacco: Not on file  . Alcohol Use: No  . Drug Use: No  . Sexually Active: Not on file   Other Topics Concern  . Not on file   Social History Narrative   Lives alone.    Current Outpatient Prescriptions on File Prior to Visit  Medication Sig Dispense Refill  . aspirin 81 MG tablet Take 81 mg by mouth daily.        . Furosemide (LASIX PO) Take 60 mg by mouth daily.        . insulin aspart protamine-insulin aspart (NOVOLOG 70/30) (70-30) 100 UNIT/ML injection Inject into the skin. Sliding scale-(75-50-75)As directed      . levothyroxine (SYNTHROID) 25 MCG tablet Take 25 mcg by mouth daily.        . metFORMIN (GLUCOPHAGE) 1000 MG tablet Take 1,000 mg by mouth 2 (two) times daily with a meal.        . naproxen sodium (ANAPROX) 220 MG tablet Take 220 mg by mouth as needed.       Marland Kitchen POTASSIUM CITRATE PO Take 20 mEq by mouth daily.       . rosuvastatin (CRESTOR) 20 MG tablet Take 20 mg by mouth daily.          Allergies  Allergen Reactions  . Penicillins     Family History  Problem  Relation Age of Onset  . Hypertension Mother   . Heart disease Mother   . Diabetes Father   . Heart disease Father   . Skin cancer Father   . Cancer Father     skin  . Hypertension Brother   . Skin cancer Paternal Grandfather   . Breast cancer Paternal Aunt    BP 124/64  Pulse 85  Temp(Src) 97.5 F (36.4 C) (Oral)  Ht 5\' 4"  (1.626 m)  Wt 306 lb (138.801 kg)  BMI 52.52 kg/m2  SpO2 96%  Review of Systems Denies weakness or numbness of the legs.      Objective:   Physical Exam VITAL SIGNS:  See vs page GENERAL: no distress.  Obese Left shoulder: full rom, but rom is painful (pt states due to 2009 injury). Left arm and hand: sensation is intact to touch, and strength is intact, but both are slightly decreased from normal (perhaps worse at the radial aspect).     Assessment &  Plan:  New left cervical radiculopathy, prob C-6 Dm.  This is a relative contraindication to steroids.

## 2011-07-24 NOTE — Patient Instructions (Addendum)
Here is a prescription a pain pill.   Let's check an mri.  you will receive a phone call, about a day and time for an appointment.  Then please call [phone] to hear your test results.  You will be prompted to enter the 9-digit "MRN" number that appears at the top left of this page, followed by #.  Then you will hear the message. I hope you feel better soon.  If you don't feel better by next week, please call dr Felicity Coyer.

## 2011-07-25 ENCOUNTER — Telehealth: Payer: Self-pay | Admitting: *Deleted

## 2011-07-25 MED ORDER — ONDANSETRON HCL 4 MG PO TABS: 4.0000 mg | ORAL_TABLET | Freq: Three times a day (TID) | ORAL | Status: AC | PRN

## 2011-07-25 NOTE — Telephone Encounter (Signed)
Pt informed

## 2011-07-25 NOTE — Telephone Encounter (Signed)
i sent rx for anti-nausea med to [facility]

## 2011-07-25 NOTE — Telephone Encounter (Signed)
Pt was seen at [facility] yesterday and states that she is still in a lot of pain and that she has not been able to keep the Percocet down-pt is asking for MD's advisement.

## 2011-07-25 NOTE — Telephone Encounter (Signed)
Bet ween the pain and nausea meds, i believe you will have no trouble with this

## 2011-07-25 NOTE — Telephone Encounter (Signed)
Pt is requesting order for Open MRI or medication to take because she is claustrophobic-she has MRI scheduled for Sunday-please advise

## 2011-07-26 ENCOUNTER — Inpatient Hospital Stay (INDEPENDENT_AMBULATORY_CARE_PROVIDER_SITE_OTHER)
Admission: RE | Admit: 2011-07-26 | Discharge: 2011-07-26 | Disposition: A | Discharge status: Home / Self Care | Payer: 59 | Source: Ambulatory Visit | Attending: Emergency Medicine | Admitting: Emergency Medicine

## 2011-07-26 ENCOUNTER — Encounter: Payer: Self-pay | Admitting: Emergency Medicine

## 2011-07-26 DIAGNOSIS — M79609 Pain in unspecified limb: Secondary | ICD-10-CM

## 2011-07-27 ENCOUNTER — Other Ambulatory Visit (HOSPITAL_COMMUNITY): Payer: 59

## 2011-07-28 ENCOUNTER — Other Ambulatory Visit (HOSPITAL_COMMUNITY): Payer: 59

## 2011-07-28 ENCOUNTER — Telehealth: Payer: Self-pay

## 2011-07-28 DIAGNOSIS — M5412 Radiculopathy, cervical region: Secondary | ICD-10-CM

## 2011-07-28 NOTE — Telephone Encounter (Signed)
Pt called requesting a new order for an open MRI, please re-schedule.

## 2011-07-28 NOTE — Telephone Encounter (Signed)
Pt informed

## 2011-07-28 NOTE — Telephone Encounter (Signed)
i ordered

## 2011-07-29 ENCOUNTER — Telehealth (INDEPENDENT_AMBULATORY_CARE_PROVIDER_SITE_OTHER): Payer: Self-pay | Admitting: *Deleted

## 2011-07-31 ENCOUNTER — Ambulatory Visit (INDEPENDENT_AMBULATORY_CARE_PROVIDER_SITE_OTHER): Payer: 59 | Admitting: Family Medicine

## 2011-07-31 ENCOUNTER — Encounter: Payer: Self-pay | Admitting: Family Medicine

## 2011-07-31 VITALS — BP 163/77 | HR 92 | Temp 97.9°F | Ht 64.0 in | Wt 298.0 lb

## 2011-07-31 DIAGNOSIS — M5412 Radiculopathy, cervical region: Secondary | ICD-10-CM

## 2011-07-31 DIAGNOSIS — M542 Cervicalgia: Secondary | ICD-10-CM

## 2011-07-31 MED ORDER — CYCLOBENZAPRINE HCL 10 MG PO TABS: 10.0000 mg | ORAL_TABLET | Freq: Three times a day (TID) | ORAL | Status: DC | PRN

## 2011-07-31 MED ORDER — MELOXICAM 15 MG PO TABS: 15.0000 mg | ORAL_TABLET | Freq: Every day | ORAL | Status: DC

## 2011-07-31 MED ORDER — PREDNISONE (PAK) 10 MG PO TABS: 10.0000 mg | ORAL_TABLET | Freq: Every day | ORAL | Status: AC

## 2011-07-31 NOTE — Assessment & Plan Note (Signed)
Left cervical radiculopathy - Patient symptoms, exam most consistent with cervical radiculopathy.  Will attempt to treat conservatively.  Burst prednisone with transition to meloxicam.  Flexeril as needed for spasms.  Does not do well with narcotic pain medicine so will avoid these.  Heat as needed to help with spasms.  Start physical therapy for ROM, strengthening, modalities including ionto.  She has an MRI of cervical spine scheduled for this Saturday - would recommend she push this back a week and if not improving by that time, go ahead with the MRI.

## 2011-07-31 NOTE — Patient Instructions (Signed)
You have cervical radiculopathy (a pinched nerve in the neck). Start prednisone 6 day dose pack followed by meloxicam 15mg  daily for pain and to relieve irritation/inflammation of the nerve. Flexeril three times a day as needed for muscle spasms (can make you sleepy - if so do not drive while taking this). Ok to take tylenol in addition to these. Consider cervical collar if severely painful. Simple range of motion exercises within limits of pain to prevent further stiffness. Start physical therapy for stretching, exercises, and modalities. Heat 15 minutes at a time 3-4 times a day to help with spasms. Watch head position when on computers, texting, when sleeping in bed - should in line with back to prevent further nerve traction and irritation. I would recommend you call  Imaging and reschedule your MRI for next Saturday and only keep the appointment if you don't improve with the medications and physical therapy. If you go forward with the MRI, give me a call so I can review this and call you with recommendations. If not improving we will consider an MRI, injections, other medications, or surgery depending on how you are doing and what further imaging shows.

## 2011-07-31 NOTE — Progress Notes (Signed)
Subjective:    Patient ID: Alice Kemp, female    DOB: 1951-08-06, 60 y.o.   MRN: [id number]  PCP: Rene Paci  HPI 60 yo F here with left upper extremity pain.  Patient reports about 1 1/2 weeks ago she felt what seemed like a catch in posterior left upper back/shoulder blade area followed by pain that has radiated down into her left arm. Pain has been constant without any relieving factors. Not worse with any specific arm movements. + night pain. Associated with a needles/pin sensation down into her hand. Went to urgent care and given tramadol, IM solumedrol. Solumedrol helped for 1 day and tramadol caused too much sleepiness. Has tried percocet but this made her vomit. She has an MRI scheduled for Saturday on cervical spine but would like to avoid this if possible. Prior history of left arm/shoulder fracture in 2009-10 and has limited motion of left shoulder as a result.  Past Medical History  Diagnosis Date  . SOB (shortness of breath)   . OSA (obstructive sleep apnea)   . Hyperlipidemia   . Diabetes mellitus type II     metformin  . Pulmonary hypertension   . Unspecified hypothyroidism   . Breast abscess     left    Current Outpatient Prescriptions on File Prior to Visit  Medication Sig Dispense Refill  . aspirin 81 MG tablet Take 81 mg by mouth daily.        . Furosemide (LASIX PO) Take 60 mg by mouth daily.        . insulin aspart protamine-insulin aspart (NOVOLOG 70/30) (70-30) 100 UNIT/ML injection Inject into the skin. Sliding scale-(75-50-75)As directed      . levothyroxine (SYNTHROID) 25 MCG tablet Take 25 mcg by mouth daily.        . metFORMIN (GLUCOPHAGE) 1000 MG tablet Take 1,000 mg by mouth 2 (two) times daily with a meal.        . ondansetron (ZOFRAN) 4 MG tablet Take 1 tablet (4 mg total) by mouth every 8 (eight) hours as needed for nausea.  20 tablet  0  . POTASSIUM CITRATE PO Take 20 mEq by mouth daily.       . rosuvastatin (CRESTOR) 20 MG tablet  Take 20 mg by mouth daily.          Past Surgical History  Procedure Date  . Shoulder surgery 2010    Left  . Tonsillectomy 1964  . Breast biopsy 1990    Left  . Cataract extraction     Allergies  Allergen Reactions  . Penicillins     History   Social History  . Marital Status: Single    Spouse Name: N/A    Number of Children: 0  . Years of Education: N/A   Occupational History  . 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 Smoking status: Never Smoker   . Smokeless tobacco: Not on file  . Alcohol Use: No  . Drug Use: No  . Sexually Active: Not on file   Other Topics Concern  . Not on file   Social History Narrative   Lives alone.    Family History  Problem Relation Age of Onset  . Hypertension Mother   . Heart disease Mother   . Heart attack Mother   . Diabetes Father   . Heart disease Father   . Skin cancer Father   . Cancer Father  skin  . Heart attack Father   . Hyperlipidemia Father   . Hypertension Brother   . Skin cancer Paternal Grandfather   . Breast cancer Paternal Aunt   . Diabetes Paternal Uncle   . Diabetes Maternal Grandfather   . Sudden death Neg Hx     BP 163/77  Pulse 92  Temp(Src) 97.9 F (36.6 C) (Oral)  Ht 5\' 4"  (1.626 m)  Wt 298 lb (135.172 kg)  BMI 51.15 kg/m2  Review of Systems See HPI above.    Objective:   Physical Exam Gen: NAD  Neck: No gross deformity, swelling, bruising. TTP left cervical paraspinal region, trapezius.  No midline/bony TTP. FROM neck with pain on turning to left within left neck. BUE strength 5/5.  Sensation intact to light touch.   2+ equal reflexes in triceps, biceps, brachioradialis tendons. Spurlings equivocal on left - radiation into upper arm/back. NV intact BUEs.  Left shoulder: No swelling, ecchymoses.  No gross deformity. No TTP. Flexion to 130 degrees, abduction 130 degrees - reports this is at her baseline. Negative  Hawkins, Neers. Negative Empty can and resisted internal/external rotation with 5/5 strength. Negative apprehension. NV intact distally.  L elbow: No TTP lateral or medial epicondyle.    Assessment & Plan:  1. Left cervical radiculopathy - Patient symptoms, exam most consistent with cervical radiculopathy.  Will attempt to treat conservatively.  Burst prednisone with transition to meloxicam.  Flexeril as needed for spasms.  Does not do well with narcotic pain medicine so will avoid these.  Heat as needed to help with spasms.  Start physical therapy for ROM, strengthening, modalities including ionto.  She has an MRI of cervical spine scheduled for this Saturday - would recommend she push this back a week and if not improving by that time, go ahead with the MRI.

## 2011-08-01 ENCOUNTER — Encounter (INDEPENDENT_AMBULATORY_CARE_PROVIDER_SITE_OTHER): Payer: Self-pay | Admitting: General Surgery

## 2011-08-01 ENCOUNTER — Telehealth: Payer: Self-pay | Admitting: Internal Medicine

## 2011-08-01 ENCOUNTER — Ambulatory Visit (INDEPENDENT_AMBULATORY_CARE_PROVIDER_SITE_OTHER): Payer: Commercial Managed Care - PPO | Admitting: General Surgery

## 2011-08-01 VITALS — BP 142/80 | HR 114 | Temp 98.0°F | Ht 64.0 in | Wt 298.8 lb

## 2011-08-01 DIAGNOSIS — N611 Abscess of the breast and nipple: Secondary | ICD-10-CM

## 2011-08-01 DIAGNOSIS — N61 Mastitis without abscess: Secondary | ICD-10-CM

## 2011-08-01 MED ORDER — SULFAMETHOXAZOLE-TRIMETHOPRIM 800-160 MG PO TABS: 1.0000 | ORAL_TABLET | Freq: Two times a day (BID) | ORAL | Status: AC

## 2011-08-01 NOTE — Telephone Encounter (Signed)
Received 6 pages from [facility] at [facility]. Forwarded to Dr. Felicity Coyer for review. 08/01/11-ar

## 2011-08-01 NOTE — Assessment & Plan Note (Signed)
Significant clinical improvement. Still with some cellulitis. Give 2 additional weeks of bactrim. Prednisone for "pinched nerve" is likely delaying resolution of infection.

## 2011-08-01 NOTE — Patient Instructions (Signed)
Take bactrim for 2 more weeks.  Call earlier if no resolution.

## 2011-08-01 NOTE — Progress Notes (Signed)
HISTORY: Pt is 2 weeks status post  I&D left breast abscess.  She has had much improvement, but still has some drainage and some redness of the skin.  The hardness is resolved.  She recently started on prednisone for cervical radiculopathy.  Her culture was positive for OSSA.    EXAM: Head: Normocephalic and atraumatic.  Eyes:  Conjunctivae are normal. Pupils are equal, round, and reactive to light. No scleral icterus.  Resp: No respiratory distress, normal effort. Breast:  Left breast much softer.  Eschar gone, now with skin epithelializing.  No fluctuant area.    Neurological: Alert and oriented to person, place, and time. Coordination normal.  Skin: Skin is warm and dry. No rash noted. No diaphoretic. No erythema. No pallor.  Psychiatric: Normal mood and affect. Normal behavior. Judgment and thought content normal.     ASSESSMENT AND PLAN:   Left breast abscess Significant clinical improvement. Still with some cellulitis. Give 2 additional weeks of bactrim. Prednisone for "pinched nerve" is likely delaying resolution of infection.         Maudry Diego, MD Surgical Oncology, General & Endocrine Surgery [facility]  Rene Paci, MD, MD Rene Paci, MD

## 2011-08-09 ENCOUNTER — Ambulatory Visit
Admission: RE | Admit: 2011-08-09 | Discharge: 2011-08-09 | Disposition: A | Discharge status: Home / Self Care | Payer: Commercial Managed Care - PPO | Source: Ambulatory Visit | Attending: Endocrinology | Admitting: Endocrinology

## 2011-08-09 DIAGNOSIS — M5412 Radiculopathy, cervical region: Secondary | ICD-10-CM

## 2011-08-13 ENCOUNTER — Ambulatory Visit: Payer: 59 | Admitting: Internal Medicine

## 2011-08-18 ENCOUNTER — Telehealth: Payer: Self-pay | Admitting: *Deleted

## 2011-08-18 NOTE — Telephone Encounter (Signed)
Requesting MRI results.

## 2011-08-19 ENCOUNTER — Encounter: Payer: Self-pay | Admitting: *Deleted

## 2011-08-19 ENCOUNTER — Encounter: Payer: 59 | Attending: Internal Medicine | Admitting: *Deleted

## 2011-08-19 DIAGNOSIS — E119 Type 2 diabetes mellitus without complications: Secondary | ICD-10-CM | POA: Insufficient documentation

## 2011-08-19 DIAGNOSIS — Z713 Dietary counseling and surveillance: Secondary | ICD-10-CM | POA: Insufficient documentation

## 2011-08-19 NOTE — Progress Notes (Signed)
  Medical Nutrition Therapy:  Appt start time: 1030 end time:  1130.   Assessment:  Primary concerns today: Very worried about need to lose weight. Has become apathetic towards diabetes management stating no matter what she does, her BG is still high. 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 Activity is limited due to her shortness of breath.   MEDICATIONS: see list. Diabetes medication is 70/30 insulin 75 units in AM and PM, 50 units at lunch which she rarely takes due to being at work.   DIETARY INTAKE:  Usual eating pattern includes 2 meals and 1-2 snacks per day.  Everyday foods include variety of most food groups.  Avoided foods include milk.    24-hr recall:  B ( AM): usually skips  Snk ( AM): occasional package of crackers  L ( PM): take out or 6" sub she eats at her desk Snk ( PM): occasional package of crackers D ( PM): meat and vegetables with occasional starch. Her mother prepares the dinner meal Snk ( PM): none Beverages: regular sodas or sweetened ice tea  Usual physical activity: limited  Estimated energy needs: 1200 calories 135 g carbohydrates 90 g protein 33 g fat  Progress Towards Goal(s):  In progress.   Nutritional Diagnosis:  NI-1.5 Excessive energy intake As related to weight and diabetes management.  As evidenced by obesity and poor diabetes control.    Intervention:  Nutrition counseling and diabetes education provided. Reviewed action of her current insulin regime and noted she does not have a correction insulin available to correct high BGs. Reviewed insulin action of the Humalog 70/30 with peak times and duration of action. Also demonstrated the advantages of possibly moving towards a basal bolus regime with a set dose at meal time plus a sliding scale or correction dose to give her the ability to bring her high BGs down. I asked her how she would feel about testing more often if she started seeing BGs within her  target range and she said she would be willing to test more. Also reviewed Carb Counting and suggested 2-3 Carb Choices per meal, 1/snack if hungry. Finally, suggested she try and walk around her department at least once every hour to increase her activity level daily.  Handouts given during visit include:  Carb Counting and Beyond Education officer, community  Label Reading handout  Monitoring/Evaluation:  Dietary intake of 2-3 Carb Choices per meal, exercise by walking around her department hourly, consider insulin regime options we discussed, and body weight in 4 week(s).

## 2011-08-19 NOTE — Telephone Encounter (Signed)
[name] did not order MRI  -       Result Notes     Notes Recorded by Romero Belling, MD on 08/09/2011 at 2:14 PM i left message on phone tree i gave results Call if sxs persist, to consider ref pmr or pt

## 2011-08-19 NOTE — Telephone Encounter (Signed)
Called pt gave # to PT to get results...11/13/12212:09pm/LMB

## 2011-08-19 NOTE — Patient Instructions (Signed)
Goals:  Follow Diabetes Meal Plan as instructed  Eat 3 meals and snacks if hungry every 3-5 hrs  Limit carbohydrate intake to 30-45 grams carbohydrate/meal  Limit carbohydrate intake to 15 grams carbohydrate/snack  Add lean protein foods to meals/snacks  Monitor glucose levels as instructed by your doctor  Aim for 5 mins of physical activity every hour at work by walking around the department several times daily  Bring food record and glucose log to your next nutrition visit

## 2011-08-20 ENCOUNTER — Ambulatory Visit: Payer: 59 | Attending: Family Medicine | Admitting: Physical Therapy

## 2011-08-20 DIAGNOSIS — M25519 Pain in unspecified shoulder: Secondary | ICD-10-CM | POA: Insufficient documentation

## 2011-08-20 DIAGNOSIS — M2560 Stiffness of unspecified joint, not elsewhere classified: Secondary | ICD-10-CM | POA: Insufficient documentation

## 2011-08-20 DIAGNOSIS — M542 Cervicalgia: Secondary | ICD-10-CM | POA: Insufficient documentation

## 2011-08-20 DIAGNOSIS — IMO0001 Reserved for inherently not codable concepts without codable children: Secondary | ICD-10-CM | POA: Insufficient documentation

## 2011-08-25 ENCOUNTER — Encounter (INDEPENDENT_AMBULATORY_CARE_PROVIDER_SITE_OTHER): Payer: Self-pay | Admitting: General Surgery

## 2011-08-25 ENCOUNTER — Ambulatory Visit (INDEPENDENT_AMBULATORY_CARE_PROVIDER_SITE_OTHER): Payer: Commercial Managed Care - PPO | Admitting: General Surgery

## 2011-08-25 VITALS — BP 132/84 | HR 60 | Temp 97.6°F | Resp 18 | Ht 64.0 in | Wt 302.0 lb

## 2011-08-25 DIAGNOSIS — N611 Abscess of the breast and nipple: Secondary | ICD-10-CM

## 2011-08-25 DIAGNOSIS — N61 Mastitis without abscess: Secondary | ICD-10-CM

## 2011-08-25 MED ORDER — DOXYCYCLINE HYCLATE 100 MG PO TABS: 100.0000 mg | ORAL_TABLET | Freq: Two times a day (BID) | ORAL | Status: DC

## 2011-08-25 NOTE — Progress Notes (Signed)
HISTORY: Pt now 6 weeks s/p I&D L breast abscess.  Pain resolved.  Wound has healed over.  No fever/chills.  Breast is still slighly pink.    EXAM: Head: Normocephalic and atraumatic.  Eyes:  Conjunctivae are normal. Pupils are equal, round, and reactive to light. No scleral icterus.  Resp: No respiratory distress, normal effort. Breast:  Left breast much softer.  Skin closed, still faint cellulitis.     Linear scab.   Neurological: Alert and oriented to person, place, and time. Coordination normal.  Skin: Skin is warm and dry. No rash noted. No diaphoretic. No erythema. No pallor.  Psychiatric: Normal mood and affect. Normal behavior. Judgment and thought content normal.     ASSESSMENT AND PLAN:   Left breast abscess Abscess resolved, but still faint cellulitis. Do two more weeks of doxycycline. Follow up PRN.        Maudry Diego, MD Surgical Oncology, General & Endocrine Surgery [facility]  Rene Paci, MD, MD Rene Paci, MD

## 2011-08-25 NOTE — Patient Instructions (Signed)
Take two more weeks of antibiotics. Call for recurrent issues.

## 2011-08-25 NOTE — Assessment & Plan Note (Signed)
Abscess resolved, but still faint cellulitis. Do two more weeks of doxycycline. Follow up PRN.

## 2011-08-27 ENCOUNTER — Ambulatory Visit: Payer: 59 | Admitting: Rehabilitation

## 2011-09-03 ENCOUNTER — Ambulatory Visit: Payer: 59 | Admitting: Physical Therapy

## 2011-09-05 ENCOUNTER — Ambulatory Visit: Payer: 59 | Admitting: Physical Therapy

## 2011-09-08 NOTE — Letter (Signed)
Summary: Colonoscopy Letter  [facility]  [address] [place], [place] [address]   Phone: [phone]  Fax: [phone]      Feb 04, 2011 MRN: [id number]   [facility] [address] [place], [place]  [address]   Dear Ms. Abbs,   According to your medical record, it is time for you to schedule a Colonoscopy. The American Cancer Society recommends this procedure as a method to detect early colon cancer. Patients with a family history of colon cancer, or a personal history of colon polyps or inflammatory bowel disease are at increased risk.  This letter has been generated based on the recommendations made at the time of your procedure. If you feel that in your particular situation this may no longer apply, please contact our office.  Please call our office at [phone] to schedule this appointment or to update your records at your earliest convenience.  Thank you for cooperating with [name] to provide you with the very best care possible.   Sincerely,  Judie Petit T. Russella Dar, M.D.    [facility] [phone]

## 2011-09-08 NOTE — Progress Notes (Signed)
Summary: BURSITIS LT ARM(rm3)   Vital Signs:  Patient Profile:   60 Years Old Female CC:      bursitis left arm Height:     65 inches Weight:      298 pounds O2 Sat:      95 % O2 treatment:    Room Air Temp:     97.8 degrees F oral Pulse rate:   87 / minute Resp:     24 per minute BP sitting:   114 / 74  (left arm)  Vitals Entered By: Linton Flemings RN (July 26, 2011 1:41 PM)                  Updated Prior Medication List: GLUCOPHAGE 1000 MG TABS (METFORMIN HCL) Take 1 tablet by mouth two times a day CRESTOR 40 MG TABS (ROSUVASTATIN CALCIUM) Take one tablet by mouth daily. FUROSEMIDE 20 MG TABS (FUROSEMIDE) Take one tablet by mouth daily. * INSULIN 73/30 as directed ALEVE 220 MG TABS (NAPROXEN SODIUM) 2-3 tablets as needed  Current Allergies (reviewed today): ! PENICILLINHistory of Present Illness Chief Complaint: bursitis left arm History of Present Illness: L arm pain, mostly at elbow but radiating to hand, shoulder, upper arm, and she also feels sore in shoulder blade.   She has had surgery on her L shoulder in the past.  She has tried Naproxen and Percocet which didn't help.  She has had bursitis in her elbow previously.  REVIEW OF SYSTEMS Constitutional Symptoms      Denies fever, chills, night sweats, weight loss, weight gain, and fatigue.  Eyes       Denies change in vision, eye pain, eye discharge, glasses, contact lenses, and eye surgery. Ear/Nose/Throat/Mouth       Denies hearing loss/aids, change in hearing, ear pain, ear discharge, dizziness, frequent runny nose, frequent nose bleeds, sinus problems, sore throat, hoarseness, and tooth pain or bleeding.  Respiratory       Complains of shortness of breath.      Denies dry cough, productive cough, wheezing, asthma, bronchitis, and emphysema/COPD.  Cardiovascular       Denies murmurs, chest pain, and tires easily with exhertion.    Gastrointestinal       Denies stomach pain, nausea/vomiting, diarrhea,  constipation, blood in bowel movements, and indigestion. Genitourniary       Denies painful urination, kidney stones, and loss of urinary control. Neurological       Complains of numbness and tingling.      Denies paralysis, seizures, and fainting/blackouts. Musculoskeletal       Complains of joint pain and muscle weakness.      Denies muscle pain, joint stiffness, decreased range of motion, redness, swelling, and gout.  Skin       Denies bruising, unusual mles/lumps or sores, and hair/skin or nail changes.  Psych       Denies mood changes, temper/anger issues, anxiety/stress, speech problems, depression, and sleep problems.  Past History:  Past Medical History: Reviewed history from 12/02/2010 and no changes required. DM type 2 Chest pains Sob  Past Surgical History: Reviewed history from 12/02/2010 and no changes required. Shoulder surgery - 2009 Tonsillectomy Breast biopsy  Family History: Reviewed history from 12/02/2010 and no changes required. Mother alive 33- Heart problems- HTN Father alive 66- Bypass surgery- DM Brothers- 2.   1- Healthy the other has high blood pressure  Social History: Reviewed history from 12/02/2010 and no changes required. 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  Mild TTP throughout shoulder, but nothing specific.  Some scapular dyskinesis.  She has TTP at lateral epicondyle with resisted wrist extension causing most pain.  Distal NV status is inatct.  No swelling, no bruising, no rashes. Assessment New Problems: ARM PAIN (ICD-729.5)   Plan New Medications/Changes: TRAMADOL HCL 50 MG TABS (TRAMADOL HCL) 1 by mouth q6 hrs as needed for pain  #24 x 0, 07/26/2011, [name]  New Orders: New Patient Level  III [phone] Wrist & Forearm Splint any size [L3984] Solumedrol up to 125mg  [J2930] Admin of Therapeutic Inj  intramuscular or subcutaneous [96372] Planning Comments:   Likely with lateral epicondylitis which is causing her to compensate and aggravate her shoulder/shoulder blade.  Will give wrist splint, put her on Tramadol for a few days, advise ice, rest.  Pt also requesting steroid shot, so will give Solumedrol 125 IM gluteal.  If not improving by early next week, should f/u with Dr. Pearletha Forge or whoever operated on her shoulder for further workup.  I don't believe this is a ridiculopathy from neck, but Xrays of C-spine and elbow may be appropriate.   The patient and/or caregiver has been counseled thoroughly with regard to medications prescribed including dosage, schedule, interactions, rationale for use, and possible side effects and they verbalize understanding.  Diagnoses and expected course of recovery discussed and will return if not improved as expected or if the condition worsens. Patient and/or caregiver verbalized understanding.  Prescriptions: TRAMADOL HCL 50 MG TABS (TRAMADOL HCL) 1 by mouth q6 hrs as needed for pain  #24 x 0   Entered and Authorized by:   [name]   Signed by:   [name] on 07/26/2011   Method used:   Print then Give to Patient   RxID:   [id number]   Medication Administration  Injection # 1:    Medication: Solumedrol up to 125mg     Diagnosis: ARM PAIN (ICD-729.5)    Route: IM    Site: RUOQ gluteus    Exp Date: 02/03/2014    Lot #: [id number]    Mfr: [employer]    Patient tolerated injection without complications    Given by: Linton Flemings RN (July 26, 2011 2:16 PM)  Orders Added: 1)  New Patient Level III [99203] 2)  Wrist & Forearm Splint any size [L3984] 3)  Solumedrol up to 125mg  [J2930] 4)  Admin of Therapeutic Inj  intramuscular or subcutaneous [87564]

## 2011-09-08 NOTE — Telephone Encounter (Signed)
  Phone Note Call from Patient   Summary of Call: Pt called and states that she is no better. Per dr henderson's note she needs to see dr Pearletha Forge or her ortho she has seen previously. pt given dr Pearletha Forge # and she is to call and sch appt.  Initial call taken by: Clemens Catholic LPN,  July 29, 2011 3:09 PM

## 2011-09-09 ENCOUNTER — Ambulatory Visit: Payer: 59 | Attending: Family Medicine | Admitting: Physical Therapy

## 2011-09-09 DIAGNOSIS — IMO0001 Reserved for inherently not codable concepts without codable children: Secondary | ICD-10-CM | POA: Insufficient documentation

## 2011-09-09 DIAGNOSIS — M2560 Stiffness of unspecified joint, not elsewhere classified: Secondary | ICD-10-CM | POA: Insufficient documentation

## 2011-09-09 DIAGNOSIS — M25519 Pain in unspecified shoulder: Secondary | ICD-10-CM | POA: Insufficient documentation

## 2011-09-09 DIAGNOSIS — M542 Cervicalgia: Secondary | ICD-10-CM | POA: Insufficient documentation

## 2011-09-11 ENCOUNTER — Encounter: Payer: 59 | Admitting: Physical Therapy

## 2011-09-15 ENCOUNTER — Encounter: Payer: Self-pay | Admitting: Family Medicine

## 2011-09-15 ENCOUNTER — Ambulatory Visit (INDEPENDENT_AMBULATORY_CARE_PROVIDER_SITE_OTHER): Payer: Self-pay | Admitting: Family Medicine

## 2011-09-15 VITALS — BP 124/75 | HR 102 | Temp 98.1°F | Ht 64.0 in | Wt 298.0 lb

## 2011-09-15 DIAGNOSIS — M5412 Radiculopathy, cervical region: Secondary | ICD-10-CM

## 2011-09-15 NOTE — Progress Notes (Addendum)
Subjective:    Patient ID: Alice Kemp, female    DOB: 09/06/1951, 60 y.o.   MRN: [id number]  PCP: Rene Paci  HPI  60 yo F here for f/u neck and left upper extremity pain.  10/25: Patient reports about 1 1/2 weeks ago she felt what seemed like a catch in posterior left upper back/shoulder blade area followed by pain that has radiated down into her left arm. Pain has been constant without any relieving factors. Not worse with any specific arm movements. + night pain. Associated with a needles/pin sensation down into her hand. Went to urgent care and given tramadol, IM solumedrol. Solumedrol helped for 1 day and tramadol caused too much sleepiness. Has tried percocet but this made her vomit. She has an MRI scheduled for Saturday on cervical spine but would like to avoid this if possible. Prior history of left arm/shoulder fracture in 2009-10 and has limited motion of left shoulder as a result.  12/10: Patient reports that her neck pain is better. No longer waking up at nighttime. Has gone to PT and done well with this. Her physical therapist wanted her to be reevaluated especially for her left shoulder. Of note she has prior left shoulder/arm fracture with resulting limited motion - she feels this is similar to how it has been past couple years however. Finished with prednisone but still taking mobic.  Takes flexeril prn. Had some numbness/tingling prior but none now. Overall pleased with her progress. She did have an MRI on 11/3 which was moderately motion degraded but had mild central stenosis with suspected left foraminal disc osteophyte complex that could cause C7 root encroachment.  Central and foraminal stenosis at other levels, mostly at C5-6; mod central stenosis at C4-5.  Past Medical History  Diagnosis Date  . SOB (shortness of breath)   . OSA (obstructive sleep apnea)   . Hyperlipidemia   . Diabetes mellitus type II     metformin  . Pulmonary hypertension     . Unspecified hypothyroidism   . Breast abscess     left  . Pinched nerve     left    Current Outpatient Prescriptions on File Prior to Visit  Medication Sig Dispense Refill  . aspirin 81 MG tablet Take 81 mg by mouth daily.        . cyclobenzaprine (FLEXERIL) 10 MG tablet Take 1 tablet (10 mg total) by mouth every 8 (eight) hours as needed for muscle spasms.  60 tablet  1  . Furosemide (LASIX PO) Take 60 mg by mouth daily.        . insulin aspart protamine-insulin aspart (NOVOLOG 70/30) (70-30) 100 UNIT/ML injection Inject into the skin. Sliding scale-(75-50-75)As directed      . levothyroxine (SYNTHROID) 25 MCG tablet Take 25 mcg by mouth daily.        . meloxicam (MOBIC) 15 MG tablet Take 1 tablet (15 mg total) by mouth daily. With food.  Start AFTER finishing prednisone  30 tablet  1  . metFORMIN (GLUCOPHAGE) 1000 MG tablet Take 1,000 mg by mouth 2 (two) times daily with a meal.        . POTASSIUM CITRATE PO Take 20 mEq by mouth daily.       . rosuvastatin (CRESTOR) 20 MG tablet Take 20 mg by mouth daily.          Past Surgical History  Procedure Date  . Shoulder surgery 2010    Left  . Tonsillectomy 1964  . Breast  biopsy 1990    Left  . Cataract extraction     Allergies  Allergen Reactions  . Penicillins     History   Social History  . Marital Status: Single    Spouse Name: N/A    Number of Children: 0  . Years of Education: N/A   Occupational History  . 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 Smoking status: Never Smoker   . Smokeless tobacco: Not on file  . Alcohol Use: No  . Drug Use: No  . Sexually Active: Not on file   Other Topics Concern  . Not on file   Social History Narrative   Lives alone.    Family History  Problem Relation Age of Onset  . Hypertension Mother   . Heart disease Mother   . Heart attack Mother   . Diabetes Father   . Heart disease Father   . Skin cancer Father    . Cancer Father     skin  . Heart attack Father   . Hyperlipidemia Father   . Hypertension Brother   . Skin cancer Paternal Grandfather   . Breast cancer Paternal Aunt   . Diabetes Paternal Uncle   . Diabetes Maternal Grandfather   . Sudden death Neg Hx     BP 124/75  Pulse 102  Temp(Src) 98.1 F (36.7 C) (Oral)  Ht 5\' 4"  (1.626 m)  Wt 298 lb (135.172 kg)  BMI 51.15 kg/m2  Review of Systems  See HPI above.    Objective:   Physical Exam  Gen: NAD  Neck: No gross deformity, swelling, bruising. Mild TTP left cervical paraspinal region, trapezius.  No midline/bony TTP. FROM without pain. BUE strength 5/5 except rotator cuff d/t limited motion.   Sensation intact to light touch.   2+ equal reflexes in triceps, biceps, brachioradialis tendons. Negative spurlings. NV intact BUEs.  Left shoulder: No swelling, ecchymoses.  No gross deformity. No TTP. Flexion to 130 degrees, abduction 130 degrees - reports this is at her baseline. Negative Empty can and mild pain with resisted internal/external rotation with 5/5 strength. NV intact distally.    Assessment & Plan:  1. Left cervical radiculopathy - improved.  Patient no longer waking at night.  Finished prednisone and taking meloxicam now with flexeril prn.  We discussed treatment options as she still has some pain - subacromial injection of left shoulder for diagnostic and therapeutic reasons (some concern that residual pain is due to shoulder but she has limited motion and feels this is at baseline), trial ESIs, or continue PT and home exercises.  She would like to continue with current management.    Addendum: PT report from 1/3 patient with significant improvement in pain, strength, ability to do ADLs.  Had not returned to PT since 12/11.

## 2011-09-15 NOTE — Assessment & Plan Note (Signed)
Left cervical radiculopathy - improved.  Patient no longer waking at night.  Finished prednisone and taking meloxicam now with flexeril prn.  We discussed treatment options as she still has some pain - subacromial injection of left shoulder for diagnostic and therapeutic reasons (some concern that residual pain is due to shoulder but she has limited motion and feels this is at baseline), trial ESIs, or continue PT and home exercises.  She would like to continue with current management.

## 2011-09-16 ENCOUNTER — Ambulatory Visit: Payer: 59 | Admitting: Physical Therapy

## 2011-09-17 ENCOUNTER — Encounter: Payer: Self-pay | Admitting: Cardiology

## 2011-09-17 ENCOUNTER — Ambulatory Visit (INDEPENDENT_AMBULATORY_CARE_PROVIDER_SITE_OTHER): Payer: 59 | Admitting: Cardiology

## 2011-09-17 VITALS — BP 138/70 | HR 101 | Ht 64.0 in | Wt 304.0 lb

## 2011-09-17 DIAGNOSIS — R072 Precordial pain: Secondary | ICD-10-CM

## 2011-09-17 DIAGNOSIS — R0602 Shortness of breath: Secondary | ICD-10-CM

## 2011-09-17 DIAGNOSIS — I319 Disease of pericardium, unspecified: Secondary | ICD-10-CM

## 2011-09-17 DIAGNOSIS — G4733 Obstructive sleep apnea (adult) (pediatric): Secondary | ICD-10-CM

## 2011-09-17 NOTE — Patient Instructions (Signed)
Your physician wants you to follow-up in: 6 MONTHS WITH DR Fredrik Cove will receive a reminder letter in the mail two months in advance. If you don't receive a letter, please call our office to schedule the follow-up appointment.   DR [facility] IN PULMONARY Thursday 09-18-11 @ 11:30AM

## 2011-09-18 ENCOUNTER — Telehealth: Payer: Self-pay | Admitting: *Deleted

## 2011-09-18 ENCOUNTER — Ambulatory Visit (INDEPENDENT_AMBULATORY_CARE_PROVIDER_SITE_OTHER): Payer: 59 | Admitting: Pulmonary Disease

## 2011-09-18 ENCOUNTER — Ambulatory Visit: Payer: 59 | Admitting: *Deleted

## 2011-09-18 DIAGNOSIS — G4733 Obstructive sleep apnea (adult) (pediatric): Secondary | ICD-10-CM

## 2011-09-18 NOTE — Assessment & Plan Note (Signed)
Should likely have a repeat echocardiogram. Has low voltage, and exam is not helpful.

## 2011-09-18 NOTE — Assessment & Plan Note (Signed)
Continues to be an issue with regard to long term prognosis-----OSA, DM, etc.

## 2011-09-18 NOTE — Telephone Encounter (Signed)
Pt called today stating she last saw [facility] in April 2012 for OSA.  He recommended either CPAP or Dental appliance.  Per phone note 02/2011, pt agreed to try cpap and order was sent to [facility] for pt to go to sleep center for mask fitting with desensitization, which pt said she did and then never heard back from [name] regarding getting set up with cpap.  Pt went and saw Dr. Riley Kill yesterday and their office scheduled pt a f/u appt with [facility] today for her to get set up on cpap.  However, pt calling today to cancel this appt stating she doesn't understand why she would need an appt when she doesn't even have cpap yet.  Pt requesting order to be sent to DME to get set up on cpap and then will call to schedule f/u appt after getting set up on cpap.  KC, please advise if this is ok or not to send order to [facility].  Thanks.

## 2011-09-18 NOTE — Assessment & Plan Note (Signed)
Wants to try cpap

## 2011-09-18 NOTE — Progress Notes (Signed)
Patient ID: Alice Kemp, female   DOB: 07-29-1951, 60 y.o.   MRN: [id number]

## 2011-09-18 NOTE — Telephone Encounter (Signed)
All of this has been a mess  Because she has not been able to make a decision about her treatment.  I never heard back from her after the order was given to get fitting at sleep center.  Will send order to [facility] to arrange for cpap machine.  Need to find out from sleep center what mask worked best and size. Let [facility] know as well.

## 2011-09-18 NOTE — Assessment & Plan Note (Signed)
Resolved

## 2011-09-18 NOTE — Progress Notes (Signed)
Pt wants to try cpap

## 2011-09-18 NOTE — Telephone Encounter (Signed)
LMOM for pt TCB 

## 2011-09-18 NOTE — Progress Notes (Signed)
HPI:  Alice Kemp is in for a follow up visit.  She is by and large stable.  She has been doing a bit better.  She says she saw Dr. Shelle Iron, but did not hear from their office.  As such, she called back and did not hear again.  She likely needs CPAP, and does say she has a fair amount of daytime fatigue.  She is not having current chest pain.   Current Outpatient Prescriptions  Medication Sig Dispense Refill  . aspirin 81 MG tablet Take 81 mg by mouth daily.        . cyclobenzaprine (FLEXERIL) 10 MG tablet Take 1 tablet (10 mg total) by mouth every 8 (eight) hours as needed for muscle spasms.  60 tablet  1  . Furosemide (LASIX PO) Take 60 mg by mouth daily.        . insulin aspart protamine-insulin aspart (NOVOLOG 70/30) (70-30) 100 UNIT/ML injection Inject into the skin. Sliding scale-(75-50-75)As directed      . levothyroxine (SYNTHROID) 25 MCG tablet Take 25 mcg by mouth daily.        . meloxicam (MOBIC) 15 MG tablet Take 1 tablet (15 mg total) by mouth daily. With food.  Start AFTER finishing prednisone  30 tablet  1  . metFORMIN (GLUCOPHAGE) 1000 MG tablet Take 1,000 mg by mouth 2 (two) times daily with a meal.        . POTASSIUM CITRATE PO Take 20 mEq by mouth daily.       . rosuvastatin (CRESTOR) 20 MG tablet Take 20 mg by mouth daily.          Allergies  Allergen Reactions  . Penicillins     Past Medical History  Diagnosis Date  . SOB (shortness of breath)   . OSA (obstructive sleep apnea)   . Hyperlipidemia   . Diabetes mellitus type II     metformin  . Pulmonary hypertension   . Unspecified hypothyroidism   . Breast abscess     left  . Pinched nerve     left    Past Surgical History  Procedure Date  . Shoulder surgery 2010    Left  . Tonsillectomy 1964  . Breast biopsy 1990    Left  . Cataract extraction     Family History  Problem Relation Age of Onset  . Hypertension Mother   . Heart disease Mother   . Heart attack Mother   . Diabetes Father   . Heart  disease Father   . Skin cancer Father   . Cancer Father     skin  . Heart attack Father   . Hyperlipidemia Father   . Hypertension Brother   . Skin cancer Paternal Grandfather   . Breast cancer Paternal Aunt   . Diabetes Paternal Uncle   . Diabetes Maternal Grandfather   . Sudden death Neg Hx     History   Social History  . Marital Status: Single    Spouse Name: N/A    Number of Children: 0  . Years of Education: N/A   Occupational History  . 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 Smoking status: Never Smoker   . Smokeless tobacco: Not on file  . Alcohol Use: No  . Drug Use: No  . Sexually Active: Not on file   Other Topics Concern  . Not on file   Social History Narrative   Lives  alone.    ROS: Please see the HPI.  All other systems reviewed and negative.  PHYSICAL EXAM:  BP 138/70  Pulse 101  Ht 5\' 4"  (1.626 m)  Wt 137.893 kg (304 lb)  BMI 52.18 kg/m2  General: Well developed, obese, in no acute distress. Head:  Normocephalic and atraumatic. Neck: no JVD Lungs: Clear to auscultation and percussion. Heart: Normal S1 and S2.  No murmur, rubs or gallops. Heart sounds are distant.  Pulses: Pulses normal in all 4 extremities. Extremities: No clubbing or cyanosis. No edema. Neurologic: Alert and oriented x 3.  EKG:  Low voltage QRS.  Sinus tachycardia.  APCs.   ASSESSMENT AND PLAN:

## 2011-09-18 NOTE — Assessment & Plan Note (Signed)
Making an appointment to see Dr.Clance back again.

## 2011-09-19 NOTE — Telephone Encounter (Signed)
Called and spoke with pt. Pt aware of KCs recs.  Pt will call to set up 5 week f/u appt once she receives her cpap machine from the DME company.

## 2011-09-23 ENCOUNTER — Telehealth: Payer: Self-pay

## 2011-09-23 DIAGNOSIS — I313 Pericardial effusion (noninflammatory): Secondary | ICD-10-CM

## 2011-09-23 DIAGNOSIS — R0602 Shortness of breath: Secondary | ICD-10-CM

## 2011-09-23 NOTE — Telephone Encounter (Signed)
Note from Dr Riley Kill in regards to phone note from Dr Shelle Iron:  Can you arrange a follow up echocardiogram. Thx

## 2011-10-02 ENCOUNTER — Ambulatory Visit: Payer: 59 | Admitting: Physical Therapy

## 2011-10-02 ENCOUNTER — Ambulatory Visit: Payer: Self-pay | Admitting: *Deleted

## 2011-10-02 NOTE — Telephone Encounter (Signed)
I spoke with the pt and scheduled her for an ECHO on 10/14/11.

## 2011-10-06 ENCOUNTER — Ambulatory Visit: Payer: 59 | Admitting: Physical Therapy

## 2011-10-09 ENCOUNTER — Encounter: Payer: Self-pay | Admitting: Physical Therapy

## 2011-10-10 ENCOUNTER — Other Ambulatory Visit: Payer: Self-pay | Admitting: Cardiology

## 2011-10-10 NOTE — Telephone Encounter (Signed)
Not sure if Dr.Stuckey refills insulin will route to [employer]

## 2011-10-10 NOTE — Telephone Encounter (Signed)
Dr Riley Kill does not fill this pt's insulin.  This should be filled by PCP Dr Felicity Coyer.  I will route this to Dr Diamantina Monks in basket.

## 2011-10-13 ENCOUNTER — Telehealth: Payer: Self-pay | Admitting: Cardiology

## 2011-10-13 MED ORDER — INSULIN ASPART PROT & ASPART (70-30 MIX) 100 UNIT/ML ~~LOC~~ SUSP: SUBCUTANEOUS | Status: DC

## 2011-10-13 MED ORDER — FUROSEMIDE 40 MG PO TABS: 60.0000 mg | ORAL_TABLET | Freq: Every day | ORAL | Status: DC

## 2011-10-13 NOTE — Telephone Encounter (Signed)
Pt was called and dosage was clarified @ 40mg , 1 and 1/2 tab daily with 150 tablets for a 90 supply with 1 refill sent to [facility] long outpatient pharmacy.   Verdis Bassette CMA

## 2011-10-13 NOTE — Telephone Encounter (Signed)
New Msg: Pt calling needing new rx of furosemide 60 mg called into [facility]. Please call this in ASAP.   This is the second time pt has called regarding the need for new RX to be called in.

## 2011-10-14 ENCOUNTER — Ambulatory Visit (HOSPITAL_COMMUNITY): Payer: 59 | Attending: Cardiology | Admitting: Radiology

## 2011-10-14 DIAGNOSIS — I319 Disease of pericardium, unspecified: Secondary | ICD-10-CM | POA: Insufficient documentation

## 2011-10-14 DIAGNOSIS — R079 Chest pain, unspecified: Secondary | ICD-10-CM | POA: Insufficient documentation

## 2011-10-14 DIAGNOSIS — E785 Hyperlipidemia, unspecified: Secondary | ICD-10-CM | POA: Insufficient documentation

## 2011-10-14 DIAGNOSIS — I313 Pericardial effusion (noninflammatory): Secondary | ICD-10-CM

## 2011-10-14 DIAGNOSIS — R0602 Shortness of breath: Secondary | ICD-10-CM | POA: Insufficient documentation

## 2011-10-22 ENCOUNTER — Telehealth: Payer: Self-pay | Admitting: Cardiology

## 2011-10-22 NOTE — Telephone Encounter (Signed)
Pt aware of ECHO results by phone. The pt said that she is using her CPAP and she feels like it is helping her.

## 2011-10-22 NOTE — Telephone Encounter (Signed)
Fu call °Patient returning your call °

## 2011-11-10 ENCOUNTER — Encounter: Payer: Self-pay | Admitting: Pulmonary Disease

## 2011-11-10 ENCOUNTER — Ambulatory Visit (INDEPENDENT_AMBULATORY_CARE_PROVIDER_SITE_OTHER): Payer: 59 | Admitting: Pulmonary Disease

## 2011-11-10 VITALS — BP 136/74 | HR 100 | Temp 98.3°F | Ht 65.0 in | Wt 305.4 lb

## 2011-11-10 DIAGNOSIS — G4733 Obstructive sleep apnea (adult) (pediatric): Secondary | ICD-10-CM

## 2011-11-10 NOTE — Assessment & Plan Note (Signed)
The patient is wearing CPAP every night, but it is not wearing it for very long during the night.  She is pulling her mask off for unknown reasons.  It is unclear whether this is a mask intolerance issue, just part of the adjustment period, or whether her pressure is inadequate and she pulls the mask off during partially treated apneas.  At this point, will put her machine on the automatic setting for the next few weeks, and see if her tolerance is better at optimal pressures.  I have also encouraged her to work aggressively on weight loss.

## 2011-11-10 NOTE — Progress Notes (Signed)
  Subjective:    Patient ID: Alice Kemp, female    DOB: 10/13/1950, 61 y.o.   MRN: [id number]  HPI The patient comes in today for followup of her known obstructive sleep apnea.  She is wearing CPAP every night, but believes that she is only keeping it on 2-3 hours a night.  She will awaken with the mask off for unknown reasons.  She is denying any issues with pressure currently, and is now using nasal pillows.  She does not think that she is having an issue with mouth opening.  Despite the limited use during the night, she thinks that she is sleeping better with increased daytime alertness.   Review of Systems  Constitutional: Negative for fever and unexpected weight change.  HENT: Positive for congestion and sneezing. Negative for ear pain, nosebleeds, sore throat, rhinorrhea, trouble swallowing, dental problem, postnasal drip and sinus pressure.   Eyes: Positive for itching. Negative for redness.  Respiratory: Positive for cough. Negative for chest tightness, shortness of breath and wheezing.   Cardiovascular: Negative for palpitations and leg swelling.  Gastrointestinal: Negative for nausea and vomiting.  Genitourinary: Negative for dysuria.  Musculoskeletal: Negative for joint swelling.  Skin: Negative for rash.  Neurological: Negative for headaches.  Hematological: Does not bruise/bleed easily.  Psychiatric/Behavioral: Negative for dysphoric mood. The patient is not nervous/anxious.        Objective:   Physical Exam Morbidly obese female in no acute distress No skin breakdown or pressure necrosis from the CPAP mask Lower extremities with edema, no cyanosis Awake, but does appear mildly sleepy, moves all 4 extremities.       Assessment & Plan:

## 2011-11-10 NOTE — Patient Instructions (Signed)
Will put your machine on the automatic setting to optimize your pressure.  Let me know if you feel the automatic setting is more comfortable for you.  Work on weight loss Followup with me in 6mos, but call if you are having tolerance issues or if you are not able to keep cpap on for at least 5 hrs a night.

## 2011-12-29 ENCOUNTER — Ambulatory Visit (INDEPENDENT_AMBULATORY_CARE_PROVIDER_SITE_OTHER): Payer: 59 | Admitting: Endocrinology

## 2011-12-29 ENCOUNTER — Encounter: Payer: Self-pay | Admitting: Endocrinology

## 2011-12-29 VITALS — BP 124/62 | HR 116 | Temp 97.5°F | Ht 64.0 in | Wt 294.4 lb

## 2011-12-29 DIAGNOSIS — I872 Venous insufficiency (chronic) (peripheral): Secondary | ICD-10-CM

## 2011-12-29 DIAGNOSIS — I8310 Varicose veins of unspecified lower extremity with inflammation: Secondary | ICD-10-CM

## 2011-12-29 MED ORDER — AZITHROMYCIN 500 MG PO TABS: 500.0000 mg | ORAL_TABLET | Freq: Every day | ORAL | Status: AC

## 2011-12-29 MED ORDER — FLUTICASONE PROPIONATE 0.05 % EX CREA: TOPICAL_CREAM | Freq: Three times a day (TID) | CUTANEOUS | Status: DC

## 2011-12-29 NOTE — Patient Instructions (Addendum)
i have sent 2 prescriptions to your pharmacy: for an anti-itch cream, and for an antibiotic.   You will get better much faster if you elevate your left leg above the rest of your body.  I hope you feel better soon.  If you don't feel better by next week, please call back.

## 2011-12-29 NOTE — Progress Notes (Signed)
Subjective:    Patient ID: Alice Kemp, female    DOB: Nov 28, 1950, 61 y.o.   MRN: [id number]  HPI Pt states few weeks of severe itching of the left leg, and assoc rash there.  She says she has had dopplers of the legs--no dvt was seen. Past Medical History  Diagnosis Date  . SOB (shortness of breath)   . OSA (obstructive sleep apnea)   . Hyperlipidemia   . Diabetes mellitus type II     metformin  . Pulmonary hypertension   . Unspecified hypothyroidism   . Breast abscess     left  . Pinched nerve     left    Past Surgical History  Procedure Date  . Shoulder surgery 2010    Left  . Tonsillectomy 1964  . Breast biopsy 1990    Left  . Cataract extraction     History   Social History  . Marital Status: Single    Spouse Name: N/A    Number of Children: 0  . Years of Education: N/A   Occupational History  . 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 Smoking status: Never Smoker   . Smokeless tobacco: Not on file  . Alcohol Use: No  . Drug Use: No  . Sexually Active: Not on file   Other Topics Concern  . Not on file   Social History Narrative   Lives alone.    Current Outpatient Prescriptions on File Prior to Visit  Medication Sig Dispense Refill  . aspirin 81 MG tablet Take 81 mg by mouth daily.        . cyclobenzaprine (FLEXERIL) 10 MG tablet Take 1 tablet (10 mg total) by mouth every 8 (eight) hours as needed for muscle spasms.  60 tablet  1  . furosemide (LASIX) 40 MG tablet Take 1.5 tablets (60 mg total) by mouth daily.  150 tablet  1  . insulin aspart protamine-insulin aspart (NOVOLOG 70/30) (70-30) 100 UNIT/ML injection Sliding scale-(75-50-75)As directed  10 mL  5  . levothyroxine (SYNTHROID) 25 MCG tablet Take 25 mcg by mouth daily.        . meloxicam (MOBIC) 15 MG tablet Take 1 tablet (15 mg total) by mouth daily. With food.  Start AFTER finishing prednisone  30 tablet  1  . metFORMIN  (GLUCOPHAGE) 1000 MG tablet Take 1,000 mg by mouth 2 (two) times daily with a meal.        . POTASSIUM CITRATE PO Take 20 mEq by mouth daily.       . rosuvastatin (CRESTOR) 20 MG tablet Take 20 mg by mouth daily.          Allergies  Allergen Reactions  . Penicillins     Family History  Problem Relation Age of Onset  . Hypertension Mother   . Heart disease Mother   . Heart attack Mother   . Diabetes Father   . Heart disease Father   . Skin cancer Father   . Cancer Father     skin  . Heart attack Father   . Hyperlipidemia Father   . Hypertension Brother   . Skin cancer Paternal Grandfather   . Breast cancer Paternal Aunt   . Diabetes Paternal Uncle   . Diabetes Maternal Grandfather   . Sudden death Neg Hx     BP 124/62  Pulse 116  Temp(Src) 97.5 F (36.4 C) (Oral)  Ht 5\' 4"  (  1.626 m)  Wt 294 lb 6.4 oz (133.539 kg)  BMI 50.53 kg/m2  SpO2 95%    Review of Systems She has swelling of the left leg.  She also reports generalized itching.    Objective:   Physical Exam VITAL SIGNS:  See vs page GENERAL: no distress Left leg: 1+ edema There is 8 cm of erythema/eczema at the anterior tibial area.         Assessment & Plan:  Stasis dermatitis.  High risk of developing systemic infection. Cellulitis, new. generalized itching

## 2012-01-04 DIAGNOSIS — I872 Venous insufficiency (chronic) (peripheral): Secondary | ICD-10-CM | POA: Insufficient documentation

## 2012-01-25 ENCOUNTER — Other Ambulatory Visit: Payer: Self-pay | Admitting: Pulmonary Disease

## 2012-01-25 DIAGNOSIS — G4733 Obstructive sleep apnea (adult) (pediatric): Secondary | ICD-10-CM

## 2012-03-08 ENCOUNTER — Other Ambulatory Visit: Payer: Self-pay | Admitting: Internal Medicine

## 2012-03-08 DIAGNOSIS — Z1231 Encounter for screening mammogram for malignant neoplasm of breast: Secondary | ICD-10-CM

## 2012-03-23 ENCOUNTER — Ambulatory Visit (HOSPITAL_COMMUNITY)
Admission: RE | Admit: 2012-03-23 | Discharge: 2012-03-23 | Disposition: A | Discharge status: Home / Self Care | Payer: 59 | Source: Ambulatory Visit | Attending: Internal Medicine | Admitting: Internal Medicine

## 2012-03-23 DIAGNOSIS — Z1231 Encounter for screening mammogram for malignant neoplasm of breast: Secondary | ICD-10-CM | POA: Insufficient documentation

## 2012-04-22 ENCOUNTER — Telehealth: Payer: Self-pay | Admitting: *Deleted

## 2012-04-22 ENCOUNTER — Other Ambulatory Visit: Payer: Self-pay | Admitting: Cardiology

## 2012-04-22 NOTE — Telephone Encounter (Signed)
Refilled furosemide

## 2012-04-23 ENCOUNTER — Other Ambulatory Visit: Payer: Self-pay | Admitting: *Deleted

## 2012-04-23 MED ORDER — POTASSIUM CITRATE ER 10 MEQ (1080 MG) PO TBCR: 20.0000 meq | EXTENDED_RELEASE_TABLET | Freq: Every day | ORAL | Status: DC

## 2012-04-26 MED ORDER — POTASSIUM CHLORIDE CRYS ER 20 MEQ PO TBCR: 20.0000 meq | EXTENDED_RELEASE_TABLET | Freq: Every day | ORAL | Status: DC

## 2012-04-26 NOTE — Telephone Encounter (Signed)
New msg Alice Kemp long outpt pharm wanted to verify potassium rx that was sent on Fri

## 2012-04-26 NOTE — Telephone Encounter (Signed)
I spoke with the pharmacy and they wanted to clarify the pt's potassium prescription.  Previously the pt has been getting potassium chloride .  I made the pharmacist aware that the pt needs potassium chloride 20 mEq one daily #90, RF 0 and the pt also needs to make an appointment with Dr Riley Kill for future refills.

## 2012-05-10 ENCOUNTER — Ambulatory Visit: Payer: 59 | Admitting: Pulmonary Disease

## 2012-05-10 ENCOUNTER — Other Ambulatory Visit (INDEPENDENT_AMBULATORY_CARE_PROVIDER_SITE_OTHER): Payer: 59

## 2012-05-10 ENCOUNTER — Ambulatory Visit (INDEPENDENT_AMBULATORY_CARE_PROVIDER_SITE_OTHER): Payer: 59 | Admitting: Internal Medicine

## 2012-05-10 ENCOUNTER — Encounter: Payer: Self-pay | Admitting: Internal Medicine

## 2012-05-10 VITALS — BP 120/74 | HR 96 | Temp 97.6°F | Ht 64.5 in | Wt 305.4 lb

## 2012-05-10 DIAGNOSIS — E119 Type 2 diabetes mellitus without complications: Secondary | ICD-10-CM

## 2012-05-10 DIAGNOSIS — Z2911 Encounter for prophylactic immunotherapy for respiratory syncytial virus (RSV): Secondary | ICD-10-CM

## 2012-05-10 DIAGNOSIS — I8310 Varicose veins of unspecified lower extremity with inflammation: Secondary | ICD-10-CM

## 2012-05-10 DIAGNOSIS — Z0000 Encounter for general adult medical examination without abnormal findings: Secondary | ICD-10-CM

## 2012-05-10 DIAGNOSIS — Z124 Encounter for screening for malignant neoplasm of cervix: Secondary | ICD-10-CM

## 2012-05-10 DIAGNOSIS — E039 Hypothyroidism, unspecified: Secondary | ICD-10-CM

## 2012-05-10 DIAGNOSIS — Z23 Encounter for immunization: Secondary | ICD-10-CM

## 2012-05-10 DIAGNOSIS — I872 Venous insufficiency (chronic) (peripheral): Secondary | ICD-10-CM

## 2012-05-10 DIAGNOSIS — Z1211 Encounter for screening for malignant neoplasm of colon: Secondary | ICD-10-CM

## 2012-05-10 LAB — HEPATIC FUNCTION PANEL
ALT: 15 U/L (ref 0–35)
Albumin: 3.3 g/dL — ABNORMAL LOW (ref 3.5–5.2)
Bilirubin, Direct: 0.1 mg/dL (ref 0.0–0.3)
Total Protein: 7.3 g/dL (ref 6.0–8.3)

## 2012-05-10 LAB — CBC WITH DIFFERENTIAL/PLATELET
Basophils Relative: 0.7 % (ref 0.0–3.0)
Eosinophils Relative: 3.3 % (ref 0.0–5.0)
HCT: 37 % (ref 36.0–46.0)
Hemoglobin: 12.3 g/dL (ref 12.0–15.0)
Lymphs Abs: 3.5 10*3/uL (ref 0.7–4.0)
MCV: 86.6 fl (ref 78.0–100.0)
Monocytes Absolute: 0.6 10*3/uL (ref 0.1–1.0)
Monocytes Relative: 7.1 % (ref 3.0–12.0)
Neutro Abs: 4.2 10*3/uL (ref 1.4–7.7)
RBC: 4.27 Mil/uL (ref 3.87–5.11)
WBC: 8.7 10*3/uL (ref 4.5–10.5)

## 2012-05-10 LAB — URINALYSIS, ROUTINE W REFLEX MICROSCOPIC
Bilirubin Urine: NEGATIVE
Hgb urine dipstick: NEGATIVE
Nitrite: NEGATIVE
Total Protein, Urine: NEGATIVE
Urine Glucose: 100
pH: 5.5 (ref 5.0–8.0)

## 2012-05-10 LAB — BASIC METABOLIC PANEL
BUN: 22 mg/dL (ref 6–23)
Chloride: 105 mEq/L (ref 96–112)
GFR: 51.36 mL/min — ABNORMAL LOW (ref >60.00)
Potassium: 4.3 mEq/L (ref 3.5–5.1)
Sodium: 139 mEq/L (ref 135–145)

## 2012-05-10 LAB — LIPID PANEL
HDL: 35 mg/dL — ABNORMAL LOW (ref >39.00)
Triglycerides: 233 mg/dL — ABNORMAL HIGH (ref 0.0–149.0)
VLDL: 46.6 mg/dL — ABNORMAL HIGH (ref 0.0–40.0)

## 2012-05-10 LAB — LDL CHOLESTEROL, DIRECT: Direct LDL: 54.6 mg/dL

## 2012-05-10 LAB — TSH: TSH: 1.37 u[IU]/mL (ref 0.35–5.50)

## 2012-05-10 MED ORDER — LEVOCETIRIZINE DIHYDROCHLORIDE 5 MG PO TABS: 5.0000 mg | ORAL_TABLET | Freq: Every day | ORAL | Status: DC

## 2012-05-10 MED ORDER — DOXYCYCLINE HYCLATE 100 MG PO TABS: 100.0000 mg | ORAL_TABLET | Freq: Two times a day (BID) | ORAL | Status: DC

## 2012-05-10 NOTE — Progress Notes (Signed)
Subjective:    Patient ID: Alice Kemp, female    DOB: 1950-12-27, 61 y.o.   MRN: [id number]  HPI patient is here today for annual physical. Patient feels well overall.  Distressed about loss of job - no health insurance after end of August 2013 Denies SI/HI, looking for new employement  Also rash on LLE - same as 12/2011 - no itch, injury or pain  DM2 - follows with endo for same - the patient reports compliance with medication(s) as prescribed. Denies adverse side effects. No hypoglycemia events or symptoms - cbgs <160  OSA - using CPAP intermittently - follows with pulm for same  Hypothyroid - the patient reports compliance with medication(s) as prescribed. Denies adverse side effects.  Past Medical History  Diagnosis Date  . OSA (obstructive sleep apnea)   . Hyperlipidemia   . Diabetes mellitus type II     metformin  . Pulmonary hypertension   . Unspecified hypothyroidism   . Breast abscess 07/2011    left; s/p I&D + abx by gen surg  . Pinched nerve     left   Family History  Problem Relation Age of Onset  . Hypertension Mother   . Heart disease Mother   . Heart attack Mother   . Diabetes Father   . Heart disease Father   . Skin cancer Father   . Cancer Father     skin  . Heart attack Father   . Hyperlipidemia Father   . Hypertension Brother   . Skin cancer Paternal Grandfather   . Breast cancer Paternal Aunt   . Diabetes Paternal Uncle   . Diabetes Maternal Grandfather   . Sudden death Neg Hx    History  Substance Use Topics  . Smoking status: Never Smoker   . Smokeless tobacco: Not on file  . Alcohol Use: No    Review of Systems Constitutional: Negative for fever or weight change.  Respiratory: Negative for cough and shortness of breath.   Cardiovascular: Negative for chest pain or palpitations.  Gastrointestinal: Negative for abdominal pain, no bowel changes.  Musculoskeletal: Negative for gait problem or joint swelling.  Skin: Negative for rash.   Neurological: Negative for dizziness or headache.  No other specific complaints in a complete review of systems (except as listed in HPI above).     Objective:   Physical Exam BP 120/74  Pulse 96  Temp 97.6 F (36.4 C) (Oral)  Ht 5' 4.5" (1.638 m)  Wt 305 lb 6.4 oz (138.529 kg)  BMI 51.61 kg/m2  SpO2 94% Wt Readings from Last 3 Encounters:  05/10/12 305 lb 6.4 oz (138.529 kg)  12/29/11 294 lb 6.4 oz (133.539 kg)  11/10/11 305 lb 6.4 oz (138.529 kg)   Constitutional: She is obese, but appears well-developed and well-nourished. No distress.  HENT: Head: Normocephalic and atraumatic. Ears: B TMs ok, no erythema or effusion; Nose: Nose normal. Mouth/Throat: Oropharynx is clear and moist. No oropharyngeal exudate.  Eyes: Conjunctivae and EOM are normal. Pupils are equal, round, and reactive to light. No scleral icterus.  Neck: Thick, Normal range of motion. Neck supple. No JVD present. No thyromegaly present.  L breast - well healed but scar at side of prior I&D - red but no fluctuance or drainage - no mass Cardiovascular: Normal rate, regular rhythm and normal heart sounds.  No murmur heard. 1+ chronic lyphedema BLE. Pulmonary/Chest: Effort normal and breath sounds normal. No respiratory distress. She has no wheezes.  Abdominal: Obese. Soft. Bowel  sounds are normal. She exhibits no distension. There is no tenderness. no masses Musculoskeletal: Normal range of motion, no joint effusions. No gross deformities Neurological: She is alert and oriented to person, place, and time. No cranial nerve deficit. Coordination normal.  Skin: Stasis dermatitis with Phillis ulceration inner distal LLE - mild stasis rash RLE/calf - other skin is warm and dry. No rash noted. No erythema.  Psychiatric: She has a normal mood and affect - appropriate tearful at time reviewing loss of employment. Her behavior is normal. Judgment and thought content normal.   Lab Results  Component Value Date   WBC 10.1  01/29/2011   HGB 12.6 01/29/2011   HCT 38.3 01/29/2011   PLT 246 01/29/2011   GLUCOSE 102* 02/05/2011   ALT 14 12/03/2010   AST 17 12/03/2010   NA 139 02/05/2011   K 4.1 02/05/2011   CL 100 02/05/2011   CREATININE 1.3* 02/05/2011   BUN 17 02/05/2011   CO2 29 02/05/2011   TSH 2.39 12/03/2010   INR 0.98 01/28/2011       Assessment & Plan:  CPX/v70.0 - Patient has been counseled on age-appropriate routine health concerns for screening and prevention. These are reviewed and up-to-date. Immunizations are up-to-date or declined. Labs ordered and prior ECG reviewed.

## 2012-05-10 NOTE — Assessment & Plan Note (Signed)
Reviewed as primary contributing risk to health issues and symptoms Discussed options for weight loss including bariatric intervention -  The patient is asked to make an attempt to improve diet and exercise patterns to aid in medical management of this problem.  Wt Readings from Last 3 Encounters:  05/10/12 305 lb 6.4 oz (138.529 kg)  12/29/11 294 lb 6.4 oz (133.539 kg)  11/10/11 305 lb 6.4 oz (138.529 kg)

## 2012-05-10 NOTE — Assessment & Plan Note (Signed)
Follows with endo (balan) for same - reports a1c "good" On metformin + 75/25 TID The current medical regimen is effective;  continue present plan and medications.

## 2012-05-10 NOTE — Assessment & Plan Note (Signed)
Recurrent redness - hx same 12/2011 reviewed Continue steroid cream and doxy antibiotics - work on [employer]

## 2012-05-10 NOTE — Assessment & Plan Note (Signed)
New dx 01/2011 - follows with endo for same The current medical regimen is effective;  continue present plan and medications.  

## 2012-05-10 NOTE — Patient Instructions (Signed)
It was good to see you today. We have reviewed your prior records including labs and tests today Health Maintenance reviewed - Shingles shot today, refer for colonoscopy and PAP -all other recommended immunizations and age-appropriate screenings are up-to-date. we'll make referral to Dr Russella Dar for colonoscopy and for PAP/pelvic gynecology exam. Our office will contact you regarding appointment(s) once made. Medications reviewed, no changes at this time. Test(s) ordered today. Your results will be called to you after review (48-72hours after test completion). If any changes need to be made, you will be notified at that time. Please schedule followup in 12 months for physical and labs, call sooner if problems.

## 2012-05-11 ENCOUNTER — Encounter: Payer: Self-pay | Admitting: Gastroenterology

## 2012-06-08 ENCOUNTER — Other Ambulatory Visit: Payer: Self-pay | Admitting: Gastroenterology

## 2012-06-08 DIAGNOSIS — Z800 Family history of malignant neoplasm of digestive organs: Secondary | ICD-10-CM

## 2012-06-08 DIAGNOSIS — Z8371 Family history of colonic polyps: Secondary | ICD-10-CM

## 2012-06-09 ENCOUNTER — Ambulatory Visit (AMBULATORY_SURGERY_CENTER): Payer: 59 | Admitting: *Deleted

## 2012-06-09 VITALS — Ht 64.5 in | Wt 307.1 lb

## 2012-06-09 DIAGNOSIS — Z8371 Family history of colonic polyps: Secondary | ICD-10-CM

## 2012-06-09 DIAGNOSIS — Z1211 Encounter for screening for malignant neoplasm of colon: Secondary | ICD-10-CM

## 2012-06-09 DIAGNOSIS — Z800 Family history of malignant neoplasm of digestive organs: Secondary | ICD-10-CM

## 2012-06-09 MED ORDER — MOVIPREP 100 G PO SOLR: ORAL | Status: DC

## 2012-06-21 ENCOUNTER — Encounter (INDEPENDENT_AMBULATORY_CARE_PROVIDER_SITE_OTHER): Payer: Self-pay | Admitting: General Surgery

## 2012-06-22 ENCOUNTER — Encounter: Payer: 59 | Admitting: Gastroenterology

## 2012-06-25 ENCOUNTER — Ambulatory Visit (INDEPENDENT_AMBULATORY_CARE_PROVIDER_SITE_OTHER): Payer: 59

## 2012-06-25 DIAGNOSIS — Z23 Encounter for immunization: Secondary | ICD-10-CM

## 2012-06-29 ENCOUNTER — Encounter (HOSPITAL_COMMUNITY)
Admission: RE | Disposition: A | Discharge status: Home / Self Care | Payer: Self-pay | Source: Ambulatory Visit | Attending: Gastroenterology

## 2012-06-29 ENCOUNTER — Encounter (HOSPITAL_COMMUNITY): Payer: Self-pay | Admitting: *Deleted

## 2012-06-29 ENCOUNTER — Ambulatory Visit (HOSPITAL_COMMUNITY)
Admission: RE | Admit: 2012-06-29 | Discharge: 2012-06-29 | Disposition: A | Discharge status: Home / Self Care | Payer: 59 | Source: Ambulatory Visit | Attending: Gastroenterology | Admitting: Gastroenterology

## 2012-06-29 DIAGNOSIS — D126 Benign neoplasm of colon, unspecified: Secondary | ICD-10-CM

## 2012-06-29 DIAGNOSIS — D371 Neoplasm of uncertain behavior of stomach: Secondary | ICD-10-CM | POA: Insufficient documentation

## 2012-06-29 DIAGNOSIS — G4733 Obstructive sleep apnea (adult) (pediatric): Secondary | ICD-10-CM | POA: Insufficient documentation

## 2012-06-29 DIAGNOSIS — Z1211 Encounter for screening for malignant neoplasm of colon: Secondary | ICD-10-CM | POA: Insufficient documentation

## 2012-06-29 DIAGNOSIS — Z800 Family history of malignant neoplasm of digestive organs: Secondary | ICD-10-CM

## 2012-06-29 DIAGNOSIS — Z8371 Family history of colonic polyps: Secondary | ICD-10-CM

## 2012-06-29 DIAGNOSIS — K5730 Diverticulosis of large intestine without perforation or abscess without bleeding: Secondary | ICD-10-CM | POA: Insufficient documentation

## 2012-06-29 DIAGNOSIS — E039 Hypothyroidism, unspecified: Secondary | ICD-10-CM | POA: Insufficient documentation

## 2012-06-29 DIAGNOSIS — E119 Type 2 diabetes mellitus without complications: Secondary | ICD-10-CM | POA: Insufficient documentation

## 2012-06-29 DIAGNOSIS — E785 Hyperlipidemia, unspecified: Secondary | ICD-10-CM | POA: Insufficient documentation

## 2012-06-29 HISTORY — PX: COLONOSCOPY: SHX5424

## 2012-06-29 HISTORY — DX: Shortness of breath: R06.02

## 2012-06-29 HISTORY — DX: Benign neoplasm of colon, unspecified: D12.6

## 2012-06-29 SURGERY — COLONOSCOPY
Anesthesia: Moderate Sedation

## 2012-06-29 MED ORDER — MIDAZOLAM HCL 5 MG/5ML IJ SOLN
INTRAMUSCULAR | Status: DC | PRN
  Administered 2012-06-29 (×2): 2 mg via INTRAVENOUS

## 2012-06-29 MED ORDER — MIDAZOLAM HCL 10 MG/2ML IJ SOLN
INTRAMUSCULAR | Status: AC
  Filled 2012-06-29: qty 2

## 2012-06-29 MED ORDER — FENTANYL CITRATE 0.05 MG/ML IJ SOLN
INTRAMUSCULAR | Status: AC
  Filled 2012-06-29: qty 2

## 2012-06-29 MED ORDER — DIPHENHYDRAMINE HCL 50 MG/ML IJ SOLN
INTRAMUSCULAR | Status: AC
  Filled 2012-06-29: qty 1

## 2012-06-29 MED ORDER — SODIUM CHLORIDE 0.9 % IV SOLN: INTRAVENOUS | Status: DC

## 2012-06-29 MED ORDER — FENTANYL CITRATE 0.05 MG/ML IJ SOLN
INTRAMUSCULAR | Status: DC | PRN
  Administered 2012-06-29 (×2): 25 ug via INTRAVENOUS

## 2012-06-29 NOTE — Op Note (Signed)
[facility] [address] [place], [address]   COLONOSCOPY PROCEDURE REPORT  PATIENT: Alice, Kemp  MR#: [id number] BIRTHDATE: 1951-04-20 , 60  yrs. old GENDER: Female ENDOSCOPIST: Meryl Dare, MD, [facility] REFERRED [id number] Felicity Coyer, M.D. PROCEDURE DATE:  06/29/2012 PROCEDURE:   Colonoscopy with snare polypectomy ASA CLASS:   Class III INDICATIONS:elevated risk screening and patient's family history of colon polyps, in her father. MEDICATIONS: These medications were titrated to patient response per physician's verbal order, Fentanyl 50 mcg IV, and Versed 4 mg IV DESCRIPTION OF PROCEDURE:   After the risks benefits and alternatives of the procedure were thoroughly explained, informed consent was obtained.  A digital rectal exam revealed no abnormalities of the rectum.   The Olympus demo adult colonoscope was introduced through the anus and advanced to the cecum, which was identified by both the appendix and ileocecal valve. No adverse events experienced.  No video photos with procedure report with the demo equipment. The quality of the prep was good, using MoviPrep The instrument was then slowly withdrawn as the colon was fully examined.      COLON FINDINGS: A sessile polyp measuring 5 mm in size was found in the transverse colon.  A polypectomy was performed with a cold snare.   A sessile polyp measuring 6 mm in size was found in the descending colon.  A polypectomy was performed with a cold snare. The resection was complete and the polyp tissue was completely retrieved.   Moderate diverticulosis was noted in the sigmoid colon.   The colon was otherwise normal.  There was no diverticulosis, inflammation, polyps or cancers unless previously stated.  Retroflexed views revealed no abnormalities. The time to cecum=3 minutes 00 seconds.  Withdrawal time=12 minutes 00 seconds. The scope was withdrawn and the procedure completed.  COMPLICATIONS: There  were no complications.  ENDOSCOPIC IMPRESSION: 1.   Sessile polyp in the transverse colon; polypectomy was performed with a cold snare 2.   Sessile polyp in the descending colon; polypectomy was performed with a cold snare 3.   Moderate diverticulosis was noted in the sigmoid colon  RECOMMENDATIONS: 1.  await pathology results 2.  High fiber diet with liberal fluid intake. 3.  repeat Colonoscopy in 5 years.   eSigned:  Meryl Dare, MD, [facility] 06/29/2012 10:14 AM   [place] [employer]]

## 2012-06-29 NOTE — H&P (Signed)
HPI  Patient is here today for screening colonoscopy. Patient feels well overall.  DM2 - follows with endocrine for same - the patient reports compliance with medication(s) as prescribed. Denies adverse side effects. No hypoglycemia events or symptoms - cbgs <160  OSA - using CPAP intermittently - follows with pulm for same  Hypothyroid - the patient reports compliance with medication(s) as prescribed. Denies adverse side effects. Denies weight loss, abdominal pain, constipation, diarrhea, change in stool caliber, melena, hematochezia, nausea, vomiting, dysphagia, reflux symptoms, chest pain. Past Medical History   Diagnosis  Date   .  OSA (obstructive sleep apnea)    .  Hyperlipidemia    .  Diabetes mellitus type II      metformin   .  Pulmonary hypertension    .  Unspecified hypothyroidism    .  Breast abscess  07/2011     left; s/p I&D + abx by gen surg   .  Pinched nerve      left    Family History   Problem  Relation  Age of Onset   .  Hypertension  Mother    .  Heart disease  Mother    .  Heart attack  Mother    .  Diabetes  Father    .  Heart disease  Father    .  Skin cancer  Father    .  Cancer  Father       skin    .  Heart attack  Father    .  Hyperlipidemia  Father    .  Hypertension  Brother    .  Skin cancer  Paternal Grandfather    .  Breast cancer  Paternal Aunt    .  Diabetes  Paternal Uncle    .  Diabetes  Maternal Grandfather    .  Sudden death  Neg Hx     History   Substance Use Topics   .  Smoking status:  Never Smoker   .  Smokeless tobacco:  Not on file   .  Alcohol Use:  No    Review of Systems  Constitutional: Negative for fever or weight change.  Respiratory: Negative for cough and shortness of breath.  Cardiovascular: Negative for chest pain or palpitations.  Gastrointestinal: Negative for abdominal pain, no bowel changes.  Musculoskeletal: Negative for gait problem or joint swelling.  Skin: Negative for rash.  Neurological: Negative for  dizziness or headache.  No other specific complaints in a complete review of systems (except as listed in HPI above).   Objective:   Physical Exam  BP 120/74  Pulse 96  Temp 97.6 F (36.4 C) (Oral)  Ht 5' 4.5" (1.638 m)  Wt 305 lb 6.4 oz (138.529 kg)  BMI 51.61 kg/m2  SpO2 94%  Wt Readings from Last 3 Encounters:   05/10/12  305 lb 6.4 oz (138.529 kg)   12/29/11  294 lb 6.4 oz (133.539 kg)   11/10/11  305 lb 6.4 oz (138.529 kg)    Constitutional: She is obese, but appears well-developed and well-nourished. No distress.  HENT: Head: Normocephalic and atraumatic. Ears: B TMs ok, no erythema or effusion; Nose: Nose normal. Mouth/Throat: Oropharynx is clear and moist. No oropharyngeal exudate.  Eyes: Conjunctivae and EOM are normal. Pupils are equal, round, and reactive to light. No scleral icterus.  Neck: Thick, Normal range of motion. Neck supple. No JVD present. No thyromegaly present.  Cardiovascular: Normal rate, regular rhythm and normal  heart sounds. No murmur heard. 1+ chronic lymphedema BLE.  Pulmonary/Chest: Effort normal and breath sounds normal. No respiratory distress. She has no wheezes.  Abdominal: Obese. Soft. Bowel sounds are normal. She exhibits no distension. There is no tenderness. no masses Musculoskeletal: Normal range of motion, no joint effusions. No gross deformities Neurological: She is alert and oriented to person, place, and time. No cranial nerve deficit. Coordination normal.  Skin: Stasis dermatitis with [place] ulceration inner distal LLE - mild stasis rash RLE/calf - other skin is warm and dry. No rash noted. No erythema.  Psychiatric: She has a normal mood and affect - appropriate tearful at time reviewing loss of employment. Her behavior is normal. Judgment and thought content normal.  Lab Results   Component  Value  Date    WBC  10.1  01/29/2011    HGB  12.6  01/29/2011    HCT  38.3  01/29/2011    PLT  246  01/29/2011    GLUCOSE  102*  02/05/2011    ALT  14   12/03/2010    AST  17  12/03/2010    NA  139  02/05/2011    K  4.1  02/05/2011    CL  100  02/05/2011    CREATININE  1.3*  02/05/2011    BUN  17  02/05/2011    CO2  29  02/05/2011    TSH  2.39  12/03/2010    INR  0.98  01/28/2011     Assessment & Plan:    1. DM   2. Hypothyroidism     3. OSA with CPAP  4. Morbid obesity  5. Colorectal cancer screening. The risks, benefits, and alternatives to colonoscopy with possible biopsy and possible polypectomy were discussed with the patient and they consent to proceed.

## 2012-06-30 ENCOUNTER — Encounter (HOSPITAL_COMMUNITY): Payer: Self-pay

## 2012-06-30 ENCOUNTER — Encounter: Payer: Self-pay | Admitting: Gastroenterology

## 2012-06-30 ENCOUNTER — Encounter (HOSPITAL_COMMUNITY): Payer: Self-pay | Admitting: Gastroenterology

## 2012-07-09 ENCOUNTER — Encounter (INDEPENDENT_AMBULATORY_CARE_PROVIDER_SITE_OTHER): Payer: Self-pay | Admitting: General Surgery

## 2012-07-09 ENCOUNTER — Ambulatory Visit (INDEPENDENT_AMBULATORY_CARE_PROVIDER_SITE_OTHER): Payer: Commercial Managed Care - PPO | Admitting: General Surgery

## 2012-07-09 VITALS — BP 132/76 | HR 96 | Temp 97.0°F | Resp 20 | Ht 64.0 in | Wt 308.4 lb

## 2012-07-09 DIAGNOSIS — T07XXXA Unspecified multiple injuries, initial encounter: Secondary | ICD-10-CM

## 2012-07-09 DIAGNOSIS — T148XXA Other injury of unspecified body region, initial encounter: Secondary | ICD-10-CM

## 2012-07-09 DIAGNOSIS — N649 Disorder of breast, unspecified: Secondary | ICD-10-CM

## 2012-07-09 NOTE — Assessment & Plan Note (Signed)
Looks like keloid but is firm.    Will do punch biopsy in 2 months if larger.  Offered punch biopsy today.  Patient wanted to wait.

## 2012-07-09 NOTE — Patient Instructions (Signed)
Follow up in 2 months.  Use antibacterial ointment on open wound daily.

## 2012-07-09 NOTE — Assessment & Plan Note (Addendum)
This is site of prior breast abscess with eschar.  Looks like still in process of healing.  Will apply bacitracin or neosporin to non healing wound.  If not healed in 2 months would biopsy lesion with punch biopsy.

## 2012-07-09 NOTE — Progress Notes (Signed)
HISTORY: Patient is a 61 year old female whom I saw last year with a large left breast abscess. She had a eschar.  The wound has not healed. She continues to have some areas that are open. She denies itching. It does look better than the last time I saw her around 10 months ago, but it should have healed by now.  She also complains of a lesion nearby on the left breast that has become firm. Her mammogram was normal.   PERTINENT REVIEW OF SYSTEMS: Otherwise negative times 11 except for HPI, wheezing, leg swelling, joint pain.     EXAM: Head: Normocephalic and atraumatic.  Eyes:  Conjunctivae are normal. Pupils are equal, round, and reactive to light. No scleral icterus.   Resp: No respiratory distress, normal effort. Breast:  Left breast with area of scaling with some open areas in site of former abscess with eschar.  Nearby is pearly pink papule.   Neurological: Alert and oriented to person, place, and time. Coordination normal.  Skin: Skin is warm and dry. No rash noted. No diaphoretic. No erythema. No pallor.  Psychiatric: Normal mood and affect. Normal behavior. Judgment and thought content normal.      ASSESSMENT AND PLAN:   Non-healing non-surgical wound This is site of prior breast abscess with eschar.  Looks like still in process of healing.  Will apply bacitracin or neosporin to non healing wound.  If not healed in 2 months would biopsy lesion with punch biopsy.      Skin lesion of breast Looks like keloid but is firm.    Will do punch biopsy in 2 months if larger.  Offered punch biopsy today.  Patient wanted to wait.        Maudry Diego, MD Surgical Oncology, General & Endocrine Surgery [facility]  Rene Paci, MD Genia Del, MD

## 2012-07-16 ENCOUNTER — Other Ambulatory Visit: Payer: Self-pay | Admitting: Internal Medicine

## 2012-07-16 NOTE — Telephone Encounter (Signed)
New Problem:    Patient called in wanting to know if her prescription could be filled today.  Please call back.

## 2012-07-23 ENCOUNTER — Other Ambulatory Visit: Payer: Self-pay | Admitting: Endocrinology

## 2012-07-27 ENCOUNTER — Encounter: Payer: Self-pay | Admitting: Cardiology

## 2012-07-27 ENCOUNTER — Ambulatory Visit (INDEPENDENT_AMBULATORY_CARE_PROVIDER_SITE_OTHER): Payer: 59 | Admitting: Cardiology

## 2012-07-27 VITALS — BP 102/62 | HR 95 | Ht 64.0 in | Wt 306.4 lb

## 2012-07-27 DIAGNOSIS — I319 Disease of pericardium, unspecified: Secondary | ICD-10-CM

## 2012-07-27 DIAGNOSIS — R0602 Shortness of breath: Secondary | ICD-10-CM

## 2012-07-27 NOTE — Assessment & Plan Note (Signed)
This is a big issue overall.  She clearly needs to lose some weight. I suggested a regular exercise program, and to stay disciplined with regard to this in order to initiate some weight loss.  We did not discourage her from the concept of surgery, and yet discussed this in a more comprehensive view about changing life style patterns in order to be successful.  I also suggested that under any specific cause of dyspnea, this would help her from a variety of standpoints.

## 2012-07-27 NOTE — Progress Notes (Signed)
HPI:   The patient returns in followup. From a cardiac standpoint she is about the same. Unfortunately, this very nice lady has been laid off from her job at [employer] after nearly 30 years of service. She is part of [place] cuts.  She is in the process of looking for employment, but this is been some stress. She continues to remain moderately short of breath. We reviewed this in some detail. She has not gotten any exercise despite being off since May. She says that part of this is just a pattern of feeling sorry for herself. She has not had chest pain. She is fairly limited what she can do, has thought about surgery for her weight. We reviewed a lot of mechanisms of dyspnea in detail today.    Current Outpatient Prescriptions  Medication Sig Dispense Refill  . aspirin 81 MG tablet Take 81 mg by mouth daily.        . celecoxib (CELEBREX) 100 MG capsule Take 100 mg by mouth as needed.      Marland Kitchen DOXYCYCLINE HYCLATE PO Take 200 mg by mouth daily.      . fluticasone (CUTIVATE) 0.05 % cream APPLY 3 TIMES A DAY AS NEEDED FOR ITCHING  60 g  1  . furosemide (LASIX) 40 MG tablet TAKE 1 & 1/2 TABLETS BY MOUTH DAILY  150 tablet  5  . insulin aspart protamine-insulin aspart (NOVOLOG 70/30) (70-30) 100 UNIT/ML injection Sliding scale-(75-50-75)As directed  10 mL  5  . levocetirizine (XYZAL) 5 MG tablet Take 1 tablet (5 mg total) by mouth at bedtime.  90 tablet  1  . levothyroxine (SYNTHROID, LEVOTHROID) 50 MCG tablet Take 50 mcg by mouth daily.      Marland Kitchen lisinopril (PRINIVIL,ZESTRIL) 2.5 MG tablet Take 2.5 mg by mouth daily.      . metFORMIN (GLUCOPHAGE) 1000 MG tablet Take 1,000 mg by mouth 2 (two) times daily with a meal.        . Naproxen Sodium (ALEVE PO) Take 1 tablet by mouth as needed.      . potassium chloride SA (K-DUR,KLOR-CON) 20 MEQ tablet Take 1 tablet (20 mEq total) by mouth daily.  90 tablet  0  . rosuvastatin (CRESTOR) 20 MG tablet Take 20 mg by mouth daily.          Allergies    Allergen Reactions  . Penicillins Rash    Past Medical History  Diagnosis Date  . Hyperlipidemia   . Diabetes mellitus type II     metformin  . Pulmonary hypertension   . Unspecified hypothyroidism   . Breast abscess 07/2011    left; s/p I&D + abx by gen surg  . Pinched nerve     left  . Shortness of breath     sob with activity  . OSA (obstructive sleep apnea)     does not use cpap  . Arthritis     Past Surgical History  Procedure Date  . Shoulder surgery 2010    Left  . Cataract extraction   . Tonsillectomy 1964  . Breast biopsy 1990    Left  . Colonoscopy 06/29/2012    Procedure: COLONOSCOPY;  Surgeon: Meryl Dare, MD,FACG;  Location: [facility];  Service: Endoscopy;  Laterality: N/A;    Family History  Problem Relation Age of Onset  . Hypertension Mother   . Heart disease Mother   . Heart attack Mother   . Diabetes Father   . Heart disease Father   .  Skin cancer Father   . Cancer Father     skin  . Heart attack Father   . Hyperlipidemia Father   . Colon polyps Father   . Hypertension Brother   . Skin cancer Paternal Grandfather   . Colon cancer Paternal Grandfather 50  . Breast cancer Paternal Aunt   . Cancer Paternal Aunt     breast  . Diabetes Paternal Uncle   . Diabetes Maternal Grandfather   . Sudden death Neg Hx     History   Social History  . Marital Status: Single    Spouse Name: N/A    Number of Children: 0  . Years of Education: N/A   Occupational History  . emergency registration Supervisor [place]    At [employer]   Social History Main Topics  . Smoking status: Never Smoker   . Smokeless tobacco: Never Used  . Alcohol Use: No  . Drug Use: No  . Sexually Active: Not on file   Other Topics Concern  . Not on file   Social History Narrative   Lives alone.    ROS: Please see the HPI.  All other systems reviewed and negative.  PHYSICAL EXAM:  BP 102/62  Pulse 95  Ht 5\' 4"  (1.626 m)  Wt 306 lb 6.4 oz  (138.982 kg)  BMI 52.59 kg/m2  General: delightful lady in no distress.  Occasionally tearful regarding employment situation.   ress.Head:  Normocephalic and atraumatic. Neck: no JVD Lungs: Clear to auscultation and percussion. Heart: Normal S1 and S2.  No murmur, rubs or gallops.  Extremities: No clubbing or cyanosis. No edema. Neurologic: Alert and oriented x 3.  EKG:  Low voltage QRS.  Leftward axis.  Delay in R wave progression.  This is likely due to lead position.    ECHO  Study Conclusions  - Left ventricle: The study is technically very limited. There is suggestion of good overall LV function. The cavity size was normal. - Left atrium: The atrium was mildly dilated. - Pericardium, extracardiac: There is a Lehane posterior echo free space that may be a Jeanlouis pericardial effusion. - Impressions: The pericardial effusion appears similar to the prior study. Technically the study now is lesser quality. Impressions:  - The pericardial effusion appears similar to the prior study. Technically the study now is lesser quality.  CT scan  IMPRESSION:  1. No evidence of acute pulmonary embolism. 2. No acute pulmonary parenchymal abnormality. 3. Vanlanen pericardial effusion.  Original Report Authenticated By: Genevive Bi, M.D.       Last Resulted: 12/05/10 4:12 PM       ASSESSMENT AND PLAN:

## 2012-07-27 NOTE — Assessment & Plan Note (Signed)
Has been consistently Alice Kemp, without obvious cause.  Continue to monitor.

## 2012-07-27 NOTE — Assessment & Plan Note (Signed)
Etiology of this is uncertain.  RHC could be considered, but doubt she has some more serious pericardial process.

## 2012-07-27 NOTE — Patient Instructions (Addendum)
Your physician recommends that you schedule a follow-up appointment in: January 2014  Your physician recommends that you have lab work today: [facility]  Your physician recommends that you continue on your current medications as directed. Please refer to the Current Medication list given to you today.

## 2012-07-28 LAB — BASIC METABOLIC PANEL
BUN: 21 mg/dL (ref 6–23)
Chloride: 106 mEq/L (ref 96–112)
Creatinine, Ser: 1.4 mg/dL — ABNORMAL HIGH (ref 0.4–1.2)
GFR: 42.23 mL/min — ABNORMAL LOW (ref >60.00)
Glucose, Bld: 97 mg/dL (ref 70–99)
Potassium: 4.3 mEq/L (ref 3.5–5.1)

## 2012-07-29 ENCOUNTER — Other Ambulatory Visit: Payer: Self-pay

## 2012-07-29 MED ORDER — POTASSIUM CHLORIDE CRYS ER 20 MEQ PO TBCR: 20.0000 meq | EXTENDED_RELEASE_TABLET | Freq: Every day | ORAL | Status: DC

## 2012-08-25 LAB — TSH: TSH: 2.55 u[IU]/mL (ref 0.41–5.90)

## 2012-08-25 LAB — LIPID PANEL: Triglycerides: 166 mg/dL — AB (ref 40–160)

## 2012-09-13 ENCOUNTER — Encounter (INDEPENDENT_AMBULATORY_CARE_PROVIDER_SITE_OTHER): Payer: Commercial Managed Care - PPO | Admitting: General Surgery

## 2012-09-13 ENCOUNTER — Ambulatory Visit (INDEPENDENT_AMBULATORY_CARE_PROVIDER_SITE_OTHER): Payer: Commercial Managed Care - PPO | Admitting: General Surgery

## 2012-10-08 ENCOUNTER — Ambulatory Visit (INDEPENDENT_AMBULATORY_CARE_PROVIDER_SITE_OTHER): Payer: Commercial Managed Care - PPO | Admitting: General Surgery

## 2012-10-27 ENCOUNTER — Ambulatory Visit: Payer: Self-pay | Admitting: Cardiology

## 2012-11-02 ENCOUNTER — Ambulatory Visit (INDEPENDENT_AMBULATORY_CARE_PROVIDER_SITE_OTHER): Payer: Commercial Managed Care - PPO | Admitting: General Surgery

## 2012-12-27 ENCOUNTER — Ambulatory Visit (INDEPENDENT_AMBULATORY_CARE_PROVIDER_SITE_OTHER): Payer: 59 | Admitting: Cardiology

## 2012-12-27 ENCOUNTER — Encounter: Payer: Self-pay | Admitting: Cardiology

## 2012-12-27 VITALS — BP 126/83 | HR 110 | Ht 64.0 in | Wt 306.0 lb

## 2012-12-27 DIAGNOSIS — G4733 Obstructive sleep apnea (adult) (pediatric): Secondary | ICD-10-CM

## 2012-12-27 DIAGNOSIS — I319 Disease of pericardium, unspecified: Secondary | ICD-10-CM

## 2012-12-27 DIAGNOSIS — R0602 Shortness of breath: Secondary | ICD-10-CM

## 2012-12-27 NOTE — Progress Notes (Signed)
HPI:  The patient has had no change in her symptoms. Her primary chief complaint still his shortness of breath. She denies ongoing chest pain. She's had a fairly extensive workup in the past.  She continues not to be very active, limiting her walking mostly around the house.  Current Outpatient Prescriptions  Medication Sig Dispense Refill  . aspirin 81 MG tablet Take 81 mg by mouth daily.        . celecoxib (CELEBREX) 100 MG capsule Take 100 mg by mouth as needed.      Marland Kitchen DOXYCYCLINE HYCLATE PO Take 200 mg by mouth daily.      . fluticasone (CUTIVATE) 0.05 % cream APPLY 3 TIMES A DAY AS NEEDED FOR ITCHING  60 g  1  . furosemide (LASIX) 40 MG tablet TAKE 1 & 1/2 TABLETS BY MOUTH DAILY  150 tablet  5  . insulin aspart protamine-insulin aspart (NOVOLOG 70/30) (70-30) 100 UNIT/ML injection Sliding scale-(75-50-75)As directed  10 mL  5  . levocetirizine (XYZAL) 5 MG tablet Take 1 tablet (5 mg total) by mouth at bedtime.  90 tablet  1  . levothyroxine (SYNTHROID, LEVOTHROID) 50 MCG tablet Take 50 mcg by mouth daily.      Marland Kitchen lisinopril (PRINIVIL,ZESTRIL) 2.5 MG tablet Take 2.5 mg by mouth daily.      . metFORMIN (GLUCOPHAGE) 1000 MG tablet Take 1,000 mg by mouth 2 (two) times daily with a meal.        . Naproxen Sodium (ALEVE PO) Take 1 tablet by mouth as needed.      . potassium chloride SA (K-DUR,KLOR-CON) 20 MEQ tablet Take 1 tablet (20 mEq total) by mouth daily.  90 tablet  3  . rosuvastatin (CRESTOR) 20 MG tablet Take 20 mg by mouth daily.         No current facility-administered medications for this visit.    Allergies  Allergen Reactions  . Penicillins Rash    Past Medical History  Diagnosis Date  . Hyperlipidemia   . Diabetes mellitus type II     metformin  . Pulmonary hypertension   . Unspecified hypothyroidism   . Breast abscess 07/2011    left; s/p I&D + abx by gen surg  . Pinched nerve     left  . Shortness of breath     sob with activity  . OSA (obstructive sleep  apnea)     does not use cpap  . Arthritis     Past Surgical History  Procedure Laterality Date  . Shoulder surgery  2010    Left  . Cataract extraction    . Tonsillectomy  1964  . Breast biopsy  1990    Left  . Colonoscopy  06/29/2012    Procedure: COLONOSCOPY;  Surgeon: Meryl Dare, MD,FACG;  Location: [facility];  Service: Endoscopy;  Laterality: N/A;    Family History  Problem Relation Age of Onset  . Hypertension Mother   . Heart disease Mother   . Heart attack Mother   . Diabetes Father   . Heart disease Father   . Skin cancer Father   . Cancer Father     skin  . Heart attack Father   . Hyperlipidemia Father   . Colon polyps Father   . Hypertension Brother   . Skin cancer Paternal Grandfather   . Colon cancer Paternal Grandfather 51  . Breast cancer Paternal Aunt   . Cancer Paternal Aunt     breast  . Diabetes Paternal  Uncle   . Diabetes Maternal Grandfather   . Sudden death Neg Hx     History   Social History  . Marital Status: Single    Spouse Name: N/A    Number of Children: 0  . Years of Education: N/A   Occupational History  . emergency registration Supervisor [employer]    At [employer]   Social History Main Topics  . Smoking status: Never Smoker   . Smokeless tobacco: Never Used  . Alcohol Use: No  . Drug Use: No  . Sexually Active: Not on file   Other Topics Concern  . Not on file   Social History Narrative   Lives alone.          ROS: Please see the HPI.  All other systems reviewed and negative.  PHYSICAL EXAM:  BP 126/83  Pulse 110  Ht 5\' 4"  (1.626 m)  Wt 306 lb (138.801 kg)  BMI 52.5 kg/m2  SpO2 94%  General: Well developed, well nourished, in no acute distress. Head:  Normocephalic and atraumatic. Neck: no JVD Lungs: Clear to auscultation and percussion. Heart: Normal S1 and S2.  No murmur, rubs or gallops.  Pulses: Pulses normal in all 4 extremities. Extremities: No clubbing or cyanosis. No  edema. Neurologic: Alert and oriented x 3.  EKG:  ST.  Low voltage QRS.  Borderline ECG.  No change.  ECHO 2013   Study Conclusions  - Left ventricle: The study is technically very limited. There is suggestion of good overall LV function. The cavity size was normal. - Left atrium: The atrium was mildly dilated. - Pericardium, extracardiac: There is a Eliot posterior echo free space that may be a Hargadon pericardial effusion. - Impressions: The pericardial effusion appears similar to the prior study. Technically the study now is lesser quality. Impressions:  - The pericardial effusion appears similar to the prior  CT SCAN 2012 IMPRESSION:  1. No evidence of acute pulmonary embolism. 2. No acute pulmonary parenchymal abnormality. 3. Emmick pericardial effusion.  Original Report Authenticated By: Genevive Bi, M.D.      Last Resulted: 12/05/10 4:12 PM                     ASSESSMENT AND PLAN:  1.  She needs to remain active and lose some weight.

## 2012-12-27 NOTE — Patient Instructions (Signed)
Follow up with cardiology as needed.

## 2012-12-28 NOTE — Assessment & Plan Note (Signed)
Has been followed and is very [place].

## 2012-12-28 NOTE — Assessment & Plan Note (Signed)
See prior evaluation.  

## 2012-12-28 NOTE — Assessment & Plan Note (Signed)
Encourage weight loss. 

## 2012-12-28 NOTE — Assessment & Plan Note (Signed)
Encouraged to walk as much as possible.

## 2013-01-26 LAB — HEPATIC FUNCTION PANEL
ALT: 8 U/L (ref 7–35)
AST: 13 U/L (ref 13–35)

## 2013-01-26 LAB — BASIC METABOLIC PANEL
BUN: 21 mg/dL (ref 4–21)
Creatinine: 1.4 mg/dL — AB (ref 0.5–1.1)
Glucose: 138 mg/dL
Potassium: 4.2 mmol/L (ref 3.4–5.3)

## 2013-01-26 LAB — LIPID PANEL: HDL: 38 mg/dL (ref 35–70)

## 2013-02-11 ENCOUNTER — Ambulatory Visit (INDEPENDENT_AMBULATORY_CARE_PROVIDER_SITE_OTHER): Payer: 59 | Admitting: Internal Medicine

## 2013-02-11 ENCOUNTER — Encounter: Payer: Self-pay | Admitting: Internal Medicine

## 2013-02-11 VITALS — BP 126/70 | HR 108 | Temp 97.9°F | Wt 319.8 lb

## 2013-02-11 DIAGNOSIS — F329 Major depressive disorder, single episode, unspecified: Secondary | ICD-10-CM | POA: Insufficient documentation

## 2013-02-11 DIAGNOSIS — F3289 Other specified depressive episodes: Secondary | ICD-10-CM

## 2013-02-11 MED ORDER — BUPROPION HCL ER (XL) 150 MG PO TB24: 150.0000 mg | ORAL_TABLET | Freq: Every day | ORAL | Status: DC

## 2013-02-11 NOTE — Assessment & Plan Note (Signed)
Chronic symptoms, acute exacerbation because of social stressors - unemployment and comorbid medical conditions Denies SI/HI Previously used Wellbutrin with good results in past, we'll resume same at this time we reviewed potential risk/benefit and possible side effects - pt understands and agrees to same  Patient will followup in 4-6 weeks to continue titration symptom review, patient agrees to call sooner if symptoms worse

## 2013-02-11 NOTE — Progress Notes (Signed)
Subjective:    Patient ID: Alice Kemp, female    DOB: [birth year], 62 y.o.   MRN: [id number]  HPI  Patient presents to the office today for f/u of chronic medical conditions:  Depression:  Patient states that for the past year she has really been struggling with dysphoric mood.  She lost her job a year ago during the [facility] health care cuts and has had difficulty finding a job since then.  She is also concerned about her health.  She states that she has no motivation to do anything, and she is not maintaining her home like she used to.  She has also had a lot of difficulty managing her appetite.  Some days she is not hungry and some days she is starving.  She denies suicidal ideation and homicidal ideation.  Diabetes:  Patient is being followed by Dr. Lisabeth Devoid in endocrinology.  She states that she is due to see her on May 21st for a f/u visit.  She recently had her labs done.  Hypothyroidism:  Patient recently had her Levothyroxine increased to 75 mg.  She states that she is doing well and is not having any ADRs.  OSA:  Patient is not using her CPAP.  It is uncomfortable and falls off all the time.  Morbid obesity:  Patient states that she is not doing well with diet or exercise.  Past Medical History  Diagnosis Date  . Hyperlipidemia   . Diabetes mellitus type II     metformin  . Pulmonary hypertension   . Unspecified hypothyroidism   . Breast abscess 07/2011    left; s/p I&D + abx by gen surg  . OSA (obstructive sleep apnea)     does not use cpap  . Arthritis   . Depression    Family History  Problem Relation Age of Onset  . Hypertension Mother   . Heart disease Mother   . Heart attack Mother   . Diabetes Father   . Heart disease Father   . Skin cancer Father   . Cancer Father     skin  . Heart attack Father   . Hyperlipidemia Father   . Colon polyps Father   . Hypertension Brother   . Skin cancer Paternal Grandfather   . Colon cancer Paternal Grandfather 78  .  Breast cancer Paternal Aunt   . Cancer Paternal Aunt     breast  . Diabetes Paternal Uncle   . Diabetes Maternal Grandfather   . Sudden death Neg Hx      Review of Systems  Constitutional: Positive for fatigue. Negative for fever, chills and unexpected weight change.  Respiratory: Positive for shortness of breath.   Cardiovascular: Positive for leg swelling. Negative for chest pain and palpitations.  Gastrointestinal: Negative for nausea, vomiting, abdominal pain, diarrhea and constipation.  Psychiatric/Behavioral: Positive for sleep disturbance, dysphoric mood and decreased concentration. Negative for suicidal ideas and self-injury. The patient is nervous/anxious.        Objective:   Physical Exam  Nursing note and vitals reviewed. Constitutional: She is oriented to person, place, and time. She appears well-developed and well-nourished.  Morbidly obese  HENT:  Head: Normocephalic and atraumatic.  Eyes: Conjunctivae are normal. No scleral icterus.  Neck: Normal range of motion. Neck supple. No thyromegaly present.  Cardiovascular: Tachycardia present.   Quiet precordium.  1+ pitting edema bilaterally in the lower leg  Pulmonary/Chest: No respiratory distress.  Diminished lung sounds at the bases.  No wheezes, crackles, or  rhonchi noted.  Lymphadenopathy:    She has no cervical adenopathy.  Neurological: She is alert and oriented to person, place, and time.  Skin: Skin is warm and dry.  Psychiatric: She exhibits a depressed mood.  Frequently teary affect   Wt Readings from Last 3 Encounters:  02/11/13 319 lb 12.8 oz (145.06 kg)  12/27/12 306 lb (138.801 kg)  07/27/12 306 lb 6.4 oz (138.982 kg)   BP Readings from Last 3 Encounters:  02/11/13 126/70  12/27/12 126/83  07/27/12 102/62    Lab Results  Component Value Date   WBC 8.7 05/10/2012   HGB 12.3 05/10/2012   HCT 37.0 05/10/2012   PLT 229.0 05/10/2012   GLUCOSE 97 07/27/2012   CHOL 119 05/10/2012   TRIG 233.0* 05/10/2012    HDL 35.00* 05/10/2012   LDLDIRECT 54.6 05/10/2012   ALT 15 05/10/2012   AST 18 05/10/2012   NA 141 07/27/2012   K 4.3 07/27/2012   CL 106 07/27/2012   CREATININE 1.4* 07/27/2012   BUN 21 07/27/2012   CO2 27 07/27/2012   TSH 1.37 05/10/2012   INR 0.98 01/28/2011         Assessment & Plan:  Patient presents for follow-up of chronic medical conditions.  Interval history and events reviewed  1.  Depression:  Patient experiencing symptoms of chronic depression.  Patient to start Wellbutrin.  Patient educated in possibility of weight gain, and suicidal ideations.  Patient instructed to call us and stop taking meds if she experiences SI/HI  2.  Diabetes:  Patient to continue to follow with endo.  We will send for a release of information from Dr. Lisabeth Devoid.  3.  Hypothyroid:  Continue to follow with endo.    4:  OSA:  Patient encouraged to wear CPAP and to get machine adjusted.  5:  Morbid obesity:  Possibility of weight loss medication and bariatric surgery discussed today.  We will first deal with depression.  Patient instructed to do some research and be prepared to discuss at next appointment.  We will see the patient back in 4-6 weeks to discuss progress with depression   I have personally reviewed this case with PA student. I also personally examined this patient. I agree with history and findings as documented above. I reviewed, discussed and approve of the assessment and plan as listed above. Rene Paci, MD

## 2013-02-11 NOTE — Progress Notes (Signed)
  Subjective:    Patient ID: Alice Kemp, female    DOB: 1951/03/28, 62 y.o.   MRN: [id number]  HPI  Here for follow up - reviewed chronic medical issues: See PA student note  Past Medical History  Diagnosis Date  . Hyperlipidemia   . Diabetes mellitus type II     metformin  . Pulmonary hypertension   . Unspecified hypothyroidism   . Breast abscess 07/2011    left; s/p I&D + abx by gen surg  . OSA (obstructive sleep apnea)     does not use cpap  . Arthritis   . Depression    Review of Systems     Objective:   Physical Exam BP 126/70  Pulse 108  Temp(Src) 97.9 F (36.6 C) (Oral)  Wt 319 lb 12.8 oz (145.06 kg)  BMI 54.87 kg/m2  SpO2 94% Wt Readings from Last 3 Encounters:  02/11/13 319 lb 12.8 oz (145.06 kg)  12/27/12 306 lb (138.801 kg)  07/27/12 306 lb 6.4 oz (138.982 kg)   Constitutional: She is obese, but appears well-developed and well-nourished. No distress.  Neck: Thick, Normal range of motion. Neck supple. No JVD present. No thyromegaly present.  Cardiovascular: Distant but Normal rate, regular rhythm and normal heart sounds.  No murmur heard. 1+ BLE edema. Pulmonary/Chest: Effort normal and breath sounds normal. No respiratory distress. She has no wheezes.  Neurological: She is alert and oriented to person, place, and time. No cranial nerve deficit. Coordination, balance, strength, speech and gait are normal.  Skin: Skin is warm and dry. No rash noted. No erythema.  Psychiatric: She has a depressed mood and freq tearful affect. Her behavior is normal. Judgment and thought content normal.   Lab Results  Component Value Date   WBC 8.7 05/10/2012   HGB 12.3 05/10/2012   HCT 37.0 05/10/2012   PLT 229.0 05/10/2012   GLUCOSE 97 07/27/2012   CHOL 119 05/10/2012   TRIG 233.0* 05/10/2012   HDL 35.00* 05/10/2012   LDLDIRECT 54.6 05/10/2012   ALT 15 05/10/2012   AST 18 05/10/2012   NA 141 07/27/2012   K 4.3 07/27/2012   CL 106 07/27/2012   CREATININE 1.4* 07/27/2012   BUN 21  07/27/2012   CO2 27 07/27/2012   TSH 1.37 05/10/2012   INR 0.98 01/28/2011        Assessment & Plan:   See problem list. Medications and labs reviewed today.

## 2013-02-11 NOTE — Assessment & Plan Note (Signed)
Reviewed as primary contributing risk to health issues and symptoms Discussed options for weight loss including bariatric intervention - Also discussed possible medical prescriptions for weight loss -  will reconsider same after primary depression issues are improved  Wt Readings from Last 3 Encounters:  02/11/13 319 lb 12.8 oz (145.06 kg)  12/27/12 306 lb (138.801 kg)  07/27/12 306 lb 6.4 oz (138.982 kg)

## 2013-02-11 NOTE — Patient Instructions (Signed)
It was good to see you today. We have reviewed your prior records including labs and tests today Start Wellbutrin 150 mg extended release once daily to help depression symptoms - Your prescription(s) have been submitted to your pharmacy. Please take as directed and contact our office if you believe you are having problem(s) with the medication(s). Other medications reviewed and updated, no additional changes recommended at this time [facility] send for release of information from Dr. Willeen Cass office to obtain recent lab work results followup in 4-6 weeks to review depression symptoms, medication and discuss options for weight loss - please call sooner if symptoms worse or unimproved  Please investigate local options for bariatric surgery as discussed, be ready to discuss at your next visit  Depression, Adult Depression refers to feeling sad, low, down in the dumps, blue, gloomy, or empty. In general, there are two kinds of depression: 1. Depression that we all experience from time to time because of upsetting life experiences, including the loss of a job or the ending of a relationship (normal sadness or normal grief). This kind of depression is considered normal, is short lived, and resolves within a few days to 2 weeks. (Depression experienced after the loss of a loved one is called bereavement. Bereavement often lasts longer than 2 weeks but normally gets better with time.) 2. Clinical depression, which lasts longer than normal sadness or normal grief or interferes with your ability to function at home, at work, and in school. It also interferes with your personal relationships. It affects almost every aspect of your life. Clinical depression is an illness. Symptoms of depression also can be caused by conditions other than normal sadness and grief or clinical depression. Examples of these conditions are listed as follows:  Physical illness Some physical illnesses, including underactive thyroid gland  (hypothyroidism), severe anemia, specific types of cancer, diabetes, uncontrolled seizures, heart and lung problems, strokes, and chronic pain are commonly associated with symptoms of depression.  Side effects of some prescription medicine In some people, certain types of prescription medicine can cause symptoms of depression.  Substance abuse Abuse of alcohol and illicit drugs can cause symptoms of depression. SYMPTOMS Symptoms of normal sadness and normal grief include the following:  Feeling sad or crying for short periods of time.  Not caring about anything (apathy).  Difficulty sleeping or sleeping too much.  No longer able to enjoy the things you used to enjoy.  Desire to be by oneself all the time (social isolation).  Lack of energy or motivation.  Difficulty concentrating or remembering.  Change in appetite or weight.  Restlessness or agitation. Symptoms of clinical depression include the same symptoms of normal sadness or normal grief and also the following symptoms:  Feeling sad or crying all the time.  Feelings of guilt or worthlessness.  Feelings of hopelessness or helplessness.  Thoughts of suicide or the desire to harm yourself (suicidal ideation).  Loss of touch with reality (psychotic symptoms). Seeing or hearing things that are not real (hallucinations) or having false beliefs about your life or the people around you (delusions and paranoia). DIAGNOSIS  The diagnosis of clinical depression usually is based on the severity and duration of the symptoms. Your caregiver also will ask you questions about your medical history and substance use to find out if physical illness, use of prescription medicine, or substance abuse is causing your depression. Your caregiver also may order blood tests. TREATMENT  Typically, normal sadness and normal grief do not require treatment. However,  sometimes antidepressant medicine is prescribed for bereavement to ease the depressive  symptoms until they resolve. The treatment for clinical depression depends on the severity of your symptoms but typically includes antidepressant medicine, counseling with a mental health professional, or a combination of both. Your caregiver will help to determine what treatment is best for you. Depression caused by physical illness usually goes away with appropriate medical treatment of the illness. If prescription medicine is causing depression, talk with your caregiver about stopping the medicine, decreasing the dose, or substituting another medicine. Depression caused by abuse of alcohol or illicit drugs abuse goes away with abstinence from these substances. Some adults need professional help in order to stop drinking or using drugs. SEEK IMMEDIATE CARE IF:  You have thoughts about hurting yourself or others.  You lose touch with reality (have psychotic symptoms).  You are taking medicine for depression and have a serious side effect. FOR MORE INFORMATION National Alliance on Mental Illness: www.nami.[employer]: http://www.maynard.net/ Document Released: 09/19/2000 Document Revised: 03/23/2012 Document Reviewed: 12/22/2011 [facility] Patient Information 2013 [place], [place].

## 2013-02-24 ENCOUNTER — Encounter: Payer: Self-pay | Admitting: Internal Medicine

## 2013-03-06 LAB — HEMOGLOBIN A1C: Hgb A1c MFr Bld: 9 % — AB (ref 4.0–6.0)

## 2013-03-30 ENCOUNTER — Ambulatory Visit (INDEPENDENT_AMBULATORY_CARE_PROVIDER_SITE_OTHER): Payer: 59 | Admitting: Internal Medicine

## 2013-03-30 ENCOUNTER — Encounter: Payer: Self-pay | Admitting: Internal Medicine

## 2013-03-30 VITALS — BP 130/78 | HR 104 | Temp 98.7°F | Wt 312.2 lb

## 2013-03-30 DIAGNOSIS — F329 Major depressive disorder, single episode, unspecified: Secondary | ICD-10-CM

## 2013-03-30 MED ORDER — METHYLPHENIDATE HCL 5 MG PO TABS: 5.0000 mg | ORAL_TABLET | Freq: Two times a day (BID) | ORAL | Status: DC

## 2013-03-30 NOTE — Assessment & Plan Note (Signed)
Chronic symptoms, acute exacerbation because of social stressors - unemployment and comorbid medical conditions Denies SI/HI Previously used Wellbutrin with good results in past  resumed same 02/2013 - less emotional but still unmotivated Add adjunctive ritalin x 30d - rx done we reviewed potential risk/benefit and possible side effects - pt understands and agrees to same  Patient will followup in 4-6 weeks to continue titration symptom review, patient agrees to call sooner if symptoms worse

## 2013-03-30 NOTE — Progress Notes (Signed)
  Subjective:    Patient ID: Alice Kemp, female    DOB: 12-02-1950, 62 y.o.   MRN: [id number]  HPI  Here for follow up depression  Past Medical History  Diagnosis Date  . Hyperlipidemia   . Diabetes mellitus type II     metformin  . Pulmonary hypertension   . Unspecified hypothyroidism   . Breast abscess 07/2011    left; s/p I&D + abx by gen surg  . Arthritis   . Depression   . Special screening for malignant neoplasms, colon 06/29/2012  . Skin lesion of breast 07/09/2012  . PERICARDIAL EFFUSION 12/12/2010    Qualifier: Diagnosis of  By: Manson Passey, RN, BSN, Lauren    . OBSTRUCTIVE SLEEP APNEA 12/23/2010    npsg 2012:  AHI 25/hr, severe desat to 68%.   autoset 2013: optimal pressure 13cm   . Morbid obesity   . Cervical radiculopathy at C6   . Benign neoplasm of colon 06/29/2012  . Anxiety   . Vitamin B12 deficiency     Review of Systems  Constitutional: Positive for fatigue. Negative for unexpected weight change.  Cardiovascular: Negative for chest pain and leg swelling.  Psychiatric/Behavioral: Positive for dysphoric mood. Negative for suicidal ideas and self-injury. The patient is not nervous/anxious.        Objective:   Physical Exam BP 130/78  Pulse 104  Temp(Src) 98.7 F (37.1 C) (Oral)  Wt 312 lb 3.2 oz (141.613 kg)  BMI 53.56 kg/m2  SpO2 94% Wt Readings from Last 3 Encounters:  03/30/13 312 lb 3.2 oz (141.613 kg)  02/11/13 319 lb 12.8 oz (145.06 kg)  12/27/12 306 lb (138.801 kg)   Constitutional: She is obese, but appears well-developed and well-nourished. No distress.  Cardiovascular: Distant but Normal rate, regular rhythm and normal heart sounds.  No murmur heard. 1+ BLE edema. Pulmonary/Chest: Effort normal and breath sounds normal. No respiratory distress. She has no wheezes.  Psychiatric: She has a less depressed mood but still dysphoric - not tearful today. Her behavior is normal. Judgment and thought content normal.   Lab Results  Component Value Date    WBC 8.7 05/10/2012   HGB 12.3 05/10/2012   HCT 37.0 05/10/2012   PLT 229.0 05/10/2012   GLUCOSE 97 07/27/2012   CHOL 126 01/26/2013   TRIG 221* 01/26/2013   HDL 38 01/26/2013   LDLDIRECT 54.6 05/10/2012   LDLCALC 44 01/26/2013   ALT 8 01/26/2013   AST 13 01/26/2013   NA 140 01/26/2013   K 4.2 01/26/2013   CL 106 07/27/2012   CREATININE 1.4* 01/26/2013   BUN 21 01/26/2013   CO2 27 07/27/2012   TSH 3.84 01/26/2013   INR 0.98 01/28/2011        Assessment & Plan:   See problem list. Medications and labs reviewed today.

## 2013-03-30 NOTE — Patient Instructions (Signed)
It was good to see you today. We have reviewed your prior records including labs and tests today continue Wellbutrin 150 mg extended release once daily and take Ritalin as discussed to help depression symptoms - Your prescription(s) have been given to you to submit to your pharmacy. Please take as directed and contact our office if you believe you are having problem(s) with the medication(s). Other medications reviewed and updated, no additional changes recommended at this time followup in 4-6 weeks to review depression symptoms, medication and diabetes - please call sooner if symptoms worse or unimproved

## 2013-04-01 ENCOUNTER — Other Ambulatory Visit: Payer: Self-pay | Admitting: *Deleted

## 2013-04-01 MED ORDER — METHYLPHENIDATE HCL 5 MG PO TABS: 5.0000 mg | ORAL_TABLET | Freq: Two times a day (BID) | ORAL | Status: DC

## 2013-04-01 NOTE — Telephone Encounter (Signed)
Called insurance they fax over PA form has been completed and fax back waiting on approval status...Raechel Chute

## 2013-04-01 NOTE — Telephone Encounter (Signed)
Pt didn't need refill she actually needed a Pa on her ritalin. MD gave rx on Wednesday when she was in the office.

## 2013-04-04 NOTE — Telephone Encounter (Signed)
Received PA back med has been approve. Notified pharmacy spoke with Arlys John gave a[[roval status...Raechel Chute

## 2013-04-18 ENCOUNTER — Other Ambulatory Visit: Payer: Self-pay | Admitting: Internal Medicine

## 2013-05-11 ENCOUNTER — Ambulatory Visit: Payer: 59 | Admitting: Internal Medicine

## 2013-05-23 ENCOUNTER — Encounter: Payer: Self-pay | Admitting: Internal Medicine

## 2013-05-23 ENCOUNTER — Ambulatory Visit (INDEPENDENT_AMBULATORY_CARE_PROVIDER_SITE_OTHER): Payer: 59 | Admitting: Internal Medicine

## 2013-05-23 VITALS — BP 130/72 | HR 100 | Temp 99.2°F | Wt 313.4 lb

## 2013-05-23 DIAGNOSIS — F329 Major depressive disorder, single episode, unspecified: Secondary | ICD-10-CM

## 2013-05-23 MED ORDER — BUPROPION HCL ER (XL) 300 MG PO TB24: 300.0000 mg | ORAL_TABLET | Freq: Every day | ORAL | Status: DC

## 2013-05-23 NOTE — Progress Notes (Signed)
Subjective:    Patient ID: Alice Kemp, female    DOB: 06-27-1951, 62 y.o.   MRN: [id number]  HPI   Patient presents today for 6 week follow up for depression. She has currently been on Wellbutrin x 12 weeks, and 6 weeks ago ritalin was added to her treatment. She states that the ritalin did not work, and she stopped taking it 05/02/13 because she has started the process to enroll in a weight loss study that limits anti-depressant medications to one or less. Since her visit 6 weeks ago, she feels she may be less tearful, but her mood is still quite low. She lacks motivation, often not wanting to leave the house, and feels very hopeless about all aspects of her life. She denies SI/HI. She has had a history of recurrent depression.  Past Medical History  Diagnosis Date  . Hyperlipidemia   . Diabetes mellitus type II     metformin  . Pulmonary hypertension   . Unspecified hypothyroidism   . Breast abscess 07/2011    left; s/p I&D + abx by gen surg  . Arthritis   . Depression   . Special screening for malignant neoplasms, colon 06/29/2012  . Skin lesion of breast 07/09/2012  . PERICARDIAL EFFUSION 12/12/2010    Qualifier: Diagnosis of  By: Manson Passey, RN, BSN, Lauren    . OBSTRUCTIVE SLEEP APNEA 12/23/2010    npsg 2012:  AHI 25/hr, severe desat to 68%.   autoset 2013: optimal pressure 13cm   . Morbid obesity   . Cervical radiculopathy at C6   . Benign neoplasm of colon 06/29/2012  . Anxiety   . Vitamin B12 deficiency    Family History  Problem Relation Age of Onset  . Hypertension Mother   . Heart disease Mother   . Heart attack Mother   . Diabetes Father   . Heart disease Father   . Skin cancer Father   . Cancer Father     skin  . Heart attack Father   . Hyperlipidemia Father   . Colon polyps Father   . Hypertension Brother   . Skin cancer Paternal Grandfather   . Colon cancer Paternal Grandfather 54  . Breast cancer Paternal Aunt   . Cancer Paternal Aunt     breast  . Diabetes  Paternal Uncle   . Diabetes Maternal Grandfather   . Sudden death Neg Hx    History  Substance Use Topics  . Smoking status: Never Smoker   . Smokeless tobacco: Never Used  . Alcohol Use: No    Review of Systems  Constitutional: Positive for fatigue. Negative for unexpected weight change.  Respiratory:       Dyspnea on exertion, chronic, unchanged  Cardiovascular: Negative for chest pain.  Gastrointestinal: Negative for abdominal pain.  Skin: Negative for rash.  Psychiatric/Behavioral: Positive for sleep disturbance and decreased concentration. The patient is nervous/anxious.   Otherwise negative or as stated in HPI     Objective:   Physical Exam  Constitutional: She is oriented to person, place, and time. She appears well-developed and well-nourished.  Obese female, Intermittently tearful during interview  HENT:  Head: Normocephalic and atraumatic.  Eyes: Conjunctivae are normal. Pupils are equal, round, and reactive to light.  Neck: Normal range of motion. Neck supple.  Cardiovascular: Normal rate and regular rhythm.   Pulmonary/Chest: Effort normal and breath sounds normal.  Neurological: She is alert and oriented to person, place, and time. No cranial nerve deficit.  Skin: Skin is  warm and dry.  Psychiatric:  Tearful, mildly anxious   Lab Results  Component Value Date   WBC 8.7 05/10/2012   HGB 12.3 05/10/2012   HCT 37.0 05/10/2012   PLT 229.0 05/10/2012   GLUCOSE 97 07/27/2012   CHOL 126 01/26/2013   TRIG 221* 01/26/2013   HDL 38 01/26/2013   LDLDIRECT 54.6 05/10/2012   LDLCALC 44 01/26/2013   ALT 8 01/26/2013   AST 13 01/26/2013   NA 140 01/26/2013   K 4.2 01/26/2013   CL 106 07/27/2012   CREATININE 1.4* 01/26/2013   BUN 21 01/26/2013   CO2 27 07/27/2012   TSH 3.84 01/26/2013   INR 0.98 01/28/2011         Assessment & Plan:  1. Depression- Patient has not noted much improvement since last visit, and she did not see any success with ritalin. Will increase dose of  Wellbutrin today. Patient declines professional counseling at this time. Time was spent with patient discussing methods to help with her depression. She is to follow up in 3-4 months or sooner if needed.   Concha Se, [place] Neighbor  I have personally reviewed this case with PA student. I also personally examined this patient. I agree with history and findings as documented above. I reviewed, discussed and approve of the assessment and plan as listed above. Rene Paci, MD

## 2013-05-23 NOTE — Patient Instructions (Signed)
It was good to see you today. We have reviewed your prior records including labs and tests today Increase dose Wellbutrin to 300mg  extended release once daily as discussed to help depression symptoms - Your prescription(s) have been given to you to submit to your pharmacy. Please take as directed and contact our office if you believe you are having problem(s) with the medication(s). Other medications reviewed and updated, no additional changes recommended at this time Please schedule followup in 3-4 months for thyroid and cholesterol check as well as depression/medication review, call sooner if problems.

## 2013-05-23 NOTE — Assessment & Plan Note (Signed)
Chronic symptoms, acute exacerbation because of social stressors - unemployment and comorbid medical conditions Denies SI/HI Previously used Wellbutrin with good results in past  resumed same 02/2013 - less emotional but still unmotivated Add adjunctive ritalin x 30d 03/2013 added no benefit Titrate to max dose wellbutrin now - erx done

## 2013-05-23 NOTE — Assessment & Plan Note (Signed)
Follows with endo (balan) for same - reports a1c >9 On metformin + 75/25 TID The patient is asked to make an attempt to improve diet and exercise patterns to aid in medical management of this problem.

## 2013-05-23 NOTE — Assessment & Plan Note (Signed)
Reviewed as primary contributing risk to health issues and symptoms Discussed options for weight loss including bariatric intervention - Also discussed possible medical prescriptions for weight loss -  will reconsider same after primary depression issues are improved  Wt Readings from Last 3 Encounters:  05/23/13 313 lb 6.4 oz (142.157 kg)  03/30/13 312 lb 3.2 oz (141.613 kg)  02/11/13 319 lb 12.8 oz (145.06 kg)

## 2013-06-15 ENCOUNTER — Telehealth: Payer: Self-pay | Admitting: Internal Medicine

## 2013-06-15 NOTE — Telephone Encounter (Signed)
Patient Information:  Caller Name: Alice Kemp  Phone: [phone]  Patient: Alice, Kemp  Gender: Female  DOB: [birth year]  Age: 62 Years  PCP: Rene Paci (Adults only)  Office Follow Up:  Does the office need to follow up with this patient?: No  Instructions For The Office: N/A   Symptoms  Reason For Call & Symptoms: Pt is calling to say she was rejected from a drug study for [facility] becuse she has "borderline low kidney function". Her question for the nurse today was "is there anything else she can do before seeing Dr. Bayard Hugger on 07/04/13. RN asked specifically what labs were drawn and what were the values that they were making that determination based on. Pt didn't know. RN advised pt that what they might consider borderline for a drug study may not be what we consider normal for daily living. Pt repeated the question over and over. RN advised pt find out the lab values and she could bring them at her visit. Otherwise advised pt drinking water and not cola drinks/coffee is always a positive step in flushing kidneys.  Reviewed Health History In EMR: No  Reviewed Medications In EMR: No  Reviewed Allergies In EMR: Yes  Reviewed Surgeries / Procedures: Yes  Date of Onset of Symptoms: 06/15/2013  Guideline(s) Used:  No Protocol Available - Information Only  Disposition Per Guideline:   Home Care  Reason For Disposition Reached:   Information only question and nurse able to answer  Advice Given:  N/A  Patient Will Follow Care Advice:  YES

## 2013-06-28 ENCOUNTER — Ambulatory Visit: Payer: 59 | Admitting: Internal Medicine

## 2013-07-01 ENCOUNTER — Ambulatory Visit: Payer: 59 | Admitting: Internal Medicine

## 2013-07-04 ENCOUNTER — Ambulatory Visit: Payer: 59 | Admitting: Internal Medicine

## 2013-07-11 ENCOUNTER — Encounter: Payer: Self-pay | Admitting: Internal Medicine

## 2013-07-11 ENCOUNTER — Other Ambulatory Visit (INDEPENDENT_AMBULATORY_CARE_PROVIDER_SITE_OTHER): Payer: 59

## 2013-07-11 ENCOUNTER — Ambulatory Visit (INDEPENDENT_AMBULATORY_CARE_PROVIDER_SITE_OTHER): Payer: 59 | Admitting: Internal Medicine

## 2013-07-11 VITALS — BP 132/80 | HR 93 | Temp 97.7°F | Wt 309.8 lb

## 2013-07-11 DIAGNOSIS — N189 Chronic kidney disease, unspecified: Secondary | ICD-10-CM

## 2013-07-11 DIAGNOSIS — N179 Acute kidney failure, unspecified: Secondary | ICD-10-CM

## 2013-07-11 DIAGNOSIS — F329 Major depressive disorder, single episode, unspecified: Secondary | ICD-10-CM

## 2013-07-11 DIAGNOSIS — Z23 Encounter for immunization: Secondary | ICD-10-CM

## 2013-07-11 DIAGNOSIS — IMO0001 Reserved for inherently not codable concepts without codable children: Secondary | ICD-10-CM

## 2013-07-11 LAB — BASIC METABOLIC PANEL
Calcium: 8.2 mg/dL — ABNORMAL LOW (ref 8.4–10.5)
Creatinine, Ser: 2 mg/dL — ABNORMAL HIGH (ref 0.4–1.2)

## 2013-07-11 MED ORDER — FUROSEMIDE 40 MG PO TABS: ORAL_TABLET | ORAL | Status: DC

## 2013-07-11 MED ORDER — METFORMIN HCL 1000 MG PO TABS: 1000.0000 mg | ORAL_TABLET | Freq: Two times a day (BID) | ORAL | Status: DC

## 2013-07-11 NOTE — Addendum Note (Signed)
Addended by: Rene Paci A on: 07/11/2013 05:30 PM   Modules accepted: Orders, Medications

## 2013-07-11 NOTE — Assessment & Plan Note (Signed)
Follows with endo (balan) for same - reports a1c 8.7 On metformin + 75/25 TID The patient is asked to make an attempt to improve diet and exercise patterns to aid in medical management of this problem.

## 2013-07-11 NOTE — Patient Instructions (Signed)
It was good to see you today. We've reviewed outside labs today and medications Remain off lisinopril, this has been removed from your list Recheck lab work and kidney function today. Your results will be called to you after review. If additional medication changes are necessary, you'll be notified at that time No other medication changes recommended today If continued abnormal dreams on increase dose Wellbutrin, call for office visit to adjust medications as needed Keep office visit in 3 months as planned, call sooner if problems

## 2013-07-11 NOTE — Progress Notes (Signed)
  Subjective:    Patient ID: Alice Kemp, female    DOB: 1951/02/05, 62 y.o.   MRN: [id number]  HPI  Here for follow up depression  Past Medical History  Diagnosis Date  . Hyperlipidemia   . Diabetes mellitus type II     metformin  . Pulmonary hypertension   . Unspecified hypothyroidism   . Breast abscess 07/2011    left; s/p I&D + abx by gen surg  . Arthritis   . Depression   . Special screening for malignant neoplasms, colon 06/29/2012  . Skin lesion of breast 07/09/2012  . PERICARDIAL EFFUSION 12/12/2010    Qualifier: Diagnosis of  By: Manson Passey, RN, BSN, Lauren    . OBSTRUCTIVE SLEEP APNEA 12/23/2010    npsg 2012:  AHI 25/hr, severe desat to 68%.   autoset 2013: optimal pressure 13cm   . Morbid obesity   . Cervical radiculopathy at C6   . Benign neoplasm of colon 06/29/2012  . Anxiety   . Vitamin B12 deficiency     Review of Systems  Constitutional: Positive for fatigue. Negative for unexpected weight change.  Cardiovascular: Negative for chest pain and leg swelling.  Psychiatric/Behavioral: Positive for dysphoric mood. Negative for suicidal ideas and self-injury. The patient is not nervous/anxious.        Objective:   Physical Exam BP 132/80  Pulse 93  Temp(Src) 97.7 F (36.5 C) (Oral)  Wt 309 lb 12.8 oz (140.524 kg)  BMI 53.15 kg/m2  SpO2 96% Wt Readings from Last 3 Encounters:  07/11/13 309 lb 12.8 oz (140.524 kg)  05/23/13 313 lb 6.4 oz (142.157 kg)  03/30/13 312 lb 3.2 oz (141.613 kg)   Constitutional: She is obese, but appears well-developed and well-nourished. No distress.  Cardiovascular: Distant but Normal rate, regular rhythm and normal heart sounds.  No murmur heard. 1+ BLE edema. Pulmonary/Chest: Effort normal and breath sounds normal. No respiratory distress. She has no wheezes.  Psychiatric: She has a mildly dysphoric mood - not tearful today. Her behavior is normal. Judgment and thought content normal.   Lab Results  Component Value Date   WBC 8.7  05/10/2012   HGB 12.3 05/10/2012   HCT 37.0 05/10/2012   PLT 229.0 05/10/2012   GLUCOSE 97 07/27/2012   CHOL 126 01/26/2013   TRIG 221* 01/26/2013   HDL 38 01/26/2013   LDLDIRECT 54.6 05/10/2012   LDLCALC 44 01/26/2013   ALT 8 01/26/2013   AST 13 01/26/2013   NA 140 01/26/2013   K 4.2 01/26/2013   CL 106 07/27/2012   CREATININE 1.4* 01/26/2013   BUN 21 01/26/2013   CO2 27 07/27/2012   TSH 3.84 01/26/2013   INR 0.98 01/28/2011   HGBA1C 9.0* 03/06/2013        Assessment & Plan:   See problem list. Medications and labs reviewed today.

## 2013-07-11 NOTE — Assessment & Plan Note (Addendum)
Increase Cr 06/2013 noted following initiation of low dose ACEI 05/2013 Pt has been off ACEI x 2 weeks - DC same from med list at this time Recheck labs now Consider reduction in furosemide and/or potassium pending these results No edema, BP controlled today  Addendum: creatinine 2.0, unchanged despite no ACEI x 2 weeks Stop potassium, Lasix, metformin and NSAIDs now Recommend aggressive oral hydration Recheck basic metabolic in 72 hours Patient understands and agrees Further treatment and evaluation to depend on results in 72h

## 2013-07-11 NOTE — Assessment & Plan Note (Signed)
Chronic symptoms, acute exacerbation because of social stressors - unemployment and comorbid medical conditions Denies SI/HI Previously used Wellbutrin with good results in past  resumed same 02/2013 - less emotional but still unmotivated Add adjunctive ritalin x 30d 03/2013 added no benefit Titrate to max dose wellbutrin 05/2013 - symptoms improved but reports abnormal dreams, tolerable side effects per pt No changes recommended at this time, patient will call if symptoms worse or unimproved

## 2013-07-14 ENCOUNTER — Telehealth: Payer: Self-pay | Admitting: *Deleted

## 2013-07-14 ENCOUNTER — Other Ambulatory Visit (INDEPENDENT_AMBULATORY_CARE_PROVIDER_SITE_OTHER): Payer: 59

## 2013-07-14 DIAGNOSIS — N179 Acute kidney failure, unspecified: Secondary | ICD-10-CM

## 2013-07-14 LAB — BASIC METABOLIC PANEL
GFR: 32.93 mL/min — ABNORMAL LOW (ref >60.00)
Potassium: 4.6 mEq/L (ref 3.5–5.1)
Sodium: 137 mEq/L (ref 135–145)

## 2013-07-14 NOTE — Telephone Encounter (Signed)
Received call from [place] pt in the lab stating md told her to come back & repeat labs that was done on Tues. Review lab entering b-met...Raechel Chute

## 2013-07-15 ENCOUNTER — Telehealth: Payer: Self-pay | Admitting: Internal Medicine

## 2013-07-15 ENCOUNTER — Other Ambulatory Visit: Payer: Self-pay | Admitting: Internal Medicine

## 2013-07-15 DIAGNOSIS — N179 Acute kidney failure, unspecified: Secondary | ICD-10-CM

## 2013-07-15 NOTE — Telephone Encounter (Signed)
Patient requesting call back with the results of her lab work from yesterday

## 2013-07-15 NOTE — Telephone Encounter (Signed)
Pt has been notified see lab result...lmb

## 2013-07-18 ENCOUNTER — Other Ambulatory Visit: Payer: Self-pay

## 2013-08-01 ENCOUNTER — Other Ambulatory Visit (INDEPENDENT_AMBULATORY_CARE_PROVIDER_SITE_OTHER): Payer: 59

## 2013-08-01 ENCOUNTER — Encounter: Payer: Self-pay | Admitting: Internal Medicine

## 2013-08-01 ENCOUNTER — Ambulatory Visit (INDEPENDENT_AMBULATORY_CARE_PROVIDER_SITE_OTHER): Payer: 59 | Admitting: Internal Medicine

## 2013-08-01 ENCOUNTER — Ambulatory Visit: Payer: 59 | Admitting: Internal Medicine

## 2013-08-01 ENCOUNTER — Telehealth: Payer: Self-pay | Admitting: Internal Medicine

## 2013-08-01 VITALS — BP 142/98 | HR 83 | Temp 98.3°F | Wt 310.4 lb

## 2013-08-01 DIAGNOSIS — N179 Acute kidney failure, unspecified: Secondary | ICD-10-CM

## 2013-08-01 DIAGNOSIS — Z1239 Encounter for other screening for malignant neoplasm of breast: Secondary | ICD-10-CM

## 2013-08-01 DIAGNOSIS — IMO0001 Reserved for inherently not codable concepts without codable children: Secondary | ICD-10-CM

## 2013-08-01 LAB — HEMOGLOBIN A1C: Hgb A1c MFr Bld: 8.9 % — ABNORMAL HIGH (ref 4.6–6.5)

## 2013-08-01 LAB — BASIC METABOLIC PANEL
CO2: 29 mEq/L (ref 19–32)
Calcium: 8.6 mg/dL (ref 8.4–10.5)
Creatinine, Ser: 1.3 mg/dL — ABNORMAL HIGH (ref 0.4–1.2)
GFR: 42.82 mL/min — ABNORMAL LOW (ref >60.00)
Sodium: 141 mEq/L (ref 135–145)

## 2013-08-01 MED ORDER — INSULIN GLARGINE 100 UNIT/ML SOLOSTAR PEN: 20.0000 [IU] | PEN_INJECTOR | Freq: Every day | SUBCUTANEOUS | Status: DC

## 2013-08-01 NOTE — Assessment & Plan Note (Signed)
Increase Cr 06/2013 noted following initiation of low dose ACEI 05/2013 follow up creatinine overall unchanged despite no ACEI, no metformin, no NSAIDs and no furosemide x 1 month Pending renal eval continue oral hydration and avoid renal toxic medications Recheck basic metabolic today

## 2013-08-01 NOTE — Telephone Encounter (Signed)
08/01/2013   [facility] called with medication questions regarding PT.  Please contact as soon as possible to clarify.  [phone]

## 2013-08-01 NOTE — Assessment & Plan Note (Addendum)
Lab Results  Component Value Date   HGBA1C 9.0* 03/06/2013   No longer on metformin since 07/11/13 ARI (see above) Will add basal insulin once daily in addition to ongoing 75/25 TID The patient is asked to make an attempt to improve diet and exercise patterns to aid in medical management of this problem.  Also encourage followup with endo Talmage Nap)

## 2013-08-01 NOTE — Progress Notes (Signed)
Subjective:    Patient ID: Alice Kemp, female    DOB: 1951/10/01, 62 y.o.   MRN: [id number]  HPI  Patient here today for follow up of abnormal renal function.  Identified on labwork 06/2013 through study, verified on repeat labs 07-11-2013.  At that time, Lasix and Metformin were discontinued.  Since then, she has had increasing lower extremity edema, but no change in chronic dyspnea on exertion - no PND.  Fasting blood sugars have been running in the 200's off Metformin.    Past Medical History  Diagnosis Date  . Hyperlipidemia   . Diabetes mellitus type II     metformin  . Pulmonary hypertension   . Unspecified hypothyroidism   . Breast abscess 07/2011    left; s/p I&D + abx by gen surg  . Arthritis   . Depression   . Special screening for malignant neoplasms, colon 06/29/2012  . Skin lesion of breast 07/09/2012  . PERICARDIAL EFFUSION 12/12/2010    Qualifier: Diagnosis of  By: Manson Passey, RN, BSN, Lauren    . OBSTRUCTIVE SLEEP APNEA 12/23/2010    npsg 2012:  AHI 25/hr, severe desat to 68%.   autoset 2013: optimal pressure 13cm   . Morbid obesity   . Cervical radiculopathy at C6   . Benign neoplasm of colon 06/29/2012  . Anxiety   . Vitamin B12 deficiency     Review of Systems  Constitutional: Negative for fever, chills, activity change and appetite change.  Respiratory: Positive for shortness of breath (with exertion - chronic). Negative for chest tightness and wheezing.   Cardiovascular: Positive for leg swelling. Negative for chest pain and palpitations.  Gastrointestinal: Negative for nausea, vomiting, diarrhea and constipation.  Genitourinary: Negative for dysuria, flank pain and difficulty urinating.  Neurological: Negative for dizziness and headaches.       Objective:   Physical Exam  Constitutional: She is oriented to person, place, and time. She appears well-nourished. No distress.  obese  Neck: Normal range of motion. Neck supple. No thyromegaly present.   Cardiovascular: Normal rate, regular rhythm and normal heart sounds.   No murmur heard. Pulmonary/Chest: Effort normal and breath sounds normal. No respiratory distress. She has no wheezes.  Musculoskeletal: Normal range of motion. She exhibits edema (2+ bilateral LE edema). She exhibits no tenderness.  Lymphadenopathy:    She has no cervical adenopathy.  Neurological: She is alert and oriented to person, place, and time.  Skin: Skin is warm and dry. She is not diaphoretic.  Psychiatric: She has a normal mood and affect. Her behavior is normal. Judgment and thought content normal.    Wt Readings from Last 3 Encounters:  08/01/13 310 lb 6.4 oz (140.797 kg)  07/11/13 309 lb 12.8 oz (140.524 kg)  05/23/13 313 lb 6.4 oz (142.157 kg)   BP Readings from Last 3 Encounters:  08/01/13 142/98  07/11/13 132/80  05/23/13 130/72   Lab Results  Component Value Date   WBC 8.7 05/10/2012   HGB 12.3 05/10/2012   HCT 37.0 05/10/2012   PLT 229.0 05/10/2012   GLUCOSE 359* 07/14/2013   CHOL 126 01/26/2013   TRIG 221* 01/26/2013   HDL 38 01/26/2013   LDLDIRECT 54.6 05/10/2012   LDLCALC 44 01/26/2013   ALT 8 01/26/2013   AST 13 01/26/2013   NA 137 07/14/2013   K 4.6 07/14/2013   CL 105 07/14/2013   CREATININE 1.7* 07/14/2013   BUN 19 07/14/2013   CO2 23 07/14/2013   TSH 3.84 01/26/2013  INR 0.98 01/28/2011   HGBA1C 9.0* 03/06/2013       Assessment & Plan:   See problem list. Medications and labs reviewed today.

## 2013-08-01 NOTE — Patient Instructions (Signed)
It was good to see you today.  We have reviewed your prior records including labs and tests today  Start Lantus 20 units at bedtime in addition to ongoing insulin therapy.  Your prescription(s) have been submitted to your pharmacy. Please take as directed and contact our office if you believe you are having problem(s) with the medication(s).  Call if fasting sugars remain over 150  Test(s) ordered today. Your results will be released to MyChart (or called to you) after review, usually within 72hours after test completion. If any changes need to be made, you will be notified at that same time.  I will check in on status of kidney specialist evaluation -until that time, remain off furosemide, potassium, metformin and Celebrex. Do not take aspirin, ibuprofen, Aleve or other over-the-counter anti-inflammatories  Please schedule followup in 3-4 months, call sooner if problems.

## 2013-08-02 ENCOUNTER — Telehealth: Payer: Self-pay | Admitting: Internal Medicine

## 2013-08-02 MED ORDER — INSULIN ASPART PROT & ASPART (70-30 MIX) 100 UNIT/ML ~~LOC~~ SUSP: SUBCUTANEOUS | Status: DC

## 2013-08-02 NOTE — Telephone Encounter (Signed)
Pt called stating that she doesn't want to be on lantus at all. She states she is not good at checking her BS TID. At one point Dr. Talmage Nap told her if she was lantus she would have to make sure she check BS before every meal. Pt states she doesn't go back to see Dr. Talmage Nap until February...Raechel Chute

## 2013-08-02 NOTE — Telephone Encounter (Signed)
See duplicate msg regarding insulin...lmb

## 2013-08-02 NOTE — Telephone Encounter (Signed)
I would hold on resuming furosemide until after kidney specialist evaluation. Any diuretic can further stress kidney function, which has improved, but has not normalized. Patient should continue low-salt/low sodium diet, elevation of feet when at rest and attention to weight loss which will help reduce leg swelling

## 2013-08-02 NOTE — Telephone Encounter (Signed)
Notified pharmacy & patient concerning md response. Pt is wanting to ask md about her ankles still being swollen. Is there anything else she can take, or when can she start back taking the meds they discontinued...lmb

## 2013-08-02 NOTE — Telephone Encounter (Signed)
Monica called from [facility] outpatient pharmacy request clarification on the lantus dosage that Dr. Felicity Coyer call in for pt yesterday. Please call [name] because she does not think that it is safe to be on 70/30.

## 2013-08-02 NOTE — Telephone Encounter (Signed)
Called Monica back she states taking when taking to two short acting is not safe. Suggest splitting insulin up plain novolog & lantus...Raechel Chute

## 2013-08-02 NOTE — Telephone Encounter (Signed)
Patient understanding is not quite correct: she should be checking 3 times a day anyway because of using 70/30 TID.... Nonetheless, we will discontinue Lantus  Instead, please increase her 70/30 to 85-60-85 follow up Dr Talmage Nap as planned, sooner if cbgs remain >200

## 2013-08-02 NOTE — Telephone Encounter (Signed)
Notified pt with md response.../lmb 

## 2013-08-18 ENCOUNTER — Ambulatory Visit (HOSPITAL_COMMUNITY): Payer: 59

## 2013-08-23 ENCOUNTER — Ambulatory Visit: Payer: 59 | Admitting: Internal Medicine

## 2013-08-29 ENCOUNTER — Ambulatory Visit (HOSPITAL_COMMUNITY)
Admission: RE | Admit: 2013-08-29 | Discharge: 2013-08-29 | Disposition: A | Discharge status: Home / Self Care | Payer: 59 | Source: Ambulatory Visit | Attending: Internal Medicine | Admitting: Internal Medicine

## 2013-08-29 ENCOUNTER — Telehealth: Payer: Self-pay | Admitting: *Deleted

## 2013-08-29 DIAGNOSIS — Z1239 Encounter for other screening for malignant neoplasm of breast: Secondary | ICD-10-CM

## 2013-08-29 DIAGNOSIS — Z1231 Encounter for screening mammogram for malignant neoplasm of breast: Secondary | ICD-10-CM | POA: Insufficient documentation

## 2013-08-29 NOTE — Telephone Encounter (Signed)
Please forward this info to [facility] to forward to nephrology -  We will do the best we can OV here if needed prior to renal eval thanks

## 2013-08-29 NOTE — Telephone Encounter (Signed)
Notified pt with md response. Gave msg to [facility]) to see if they are able to see pt sooner. Pt states nurse was concern about her wheezing. Inform pt if she need to see md to call and make appt concerning wheezing...lmb

## 2013-08-29 NOTE — Telephone Encounter (Signed)
Left msg on vm stating saw pt today for [facility] follow-up. Wanting to see can md expedite referral to the Nephrologist because pt has renal issues. Increasing creatinine levels, decreasing GFR. She was told to stop taking the metformin, lisinopril and lasix which pt has, but she is having increase edema, and SOB...lmb

## 2013-09-02 ENCOUNTER — Encounter: Payer: Self-pay | Admitting: Internal Medicine

## 2013-09-02 ENCOUNTER — Other Ambulatory Visit (INDEPENDENT_AMBULATORY_CARE_PROVIDER_SITE_OTHER): Payer: 59

## 2013-09-02 ENCOUNTER — Ambulatory Visit (INDEPENDENT_AMBULATORY_CARE_PROVIDER_SITE_OTHER): Payer: 59 | Admitting: Internal Medicine

## 2013-09-02 VITALS — BP 138/94 | HR 107 | Temp 98.2°F | Ht 64.0 in | Wt 334.0 lb

## 2013-09-02 DIAGNOSIS — R609 Edema, unspecified: Secondary | ICD-10-CM

## 2013-09-02 DIAGNOSIS — I5021 Acute systolic (congestive) heart failure: Secondary | ICD-10-CM

## 2013-09-02 LAB — BASIC METABOLIC PANEL
BUN: 15 mg/dL (ref 6–23)
CO2: 30 mEq/L (ref 19–32)
Calcium: 8.4 mg/dL (ref 8.4–10.5)
GFR: 43.95 mL/min — ABNORMAL LOW (ref >60.00)
Glucose, Bld: 117 mg/dL — ABNORMAL HIGH (ref 70–99)
Potassium: 4 mEq/L (ref 3.5–5.1)

## 2013-09-02 LAB — BRAIN NATRIURETIC PEPTIDE: Pro B Natriuretic peptide (BNP): 67 pg/mL (ref 0.0–100.0)

## 2013-09-02 MED ORDER — FUROSEMIDE 80 MG PO TABS: 80.0000 mg | ORAL_TABLET | Freq: Two times a day (BID) | ORAL | Status: DC

## 2013-09-02 NOTE — Assessment & Plan Note (Signed)
Increase Cr 06/2013 noted following initiation of low dose ACEI 05/2013 follow up creatinine overall unchanged despite no ACEI, no metformin, no NSAIDs and no furosemide > 1 month Resume lasix at high dose as for ?chf - see edema above Pending renal eval -?available sooner - will check continue oral hydration and avoid renal toxic medications Recheck basic metabolic today

## 2013-09-02 NOTE — Patient Instructions (Addendum)
It was good to see you today.  We have reviewed your prior records including labs and tests today  Start lasix 80mg  2x/day until further notice  Your prescription(s) have been submitted to your pharmacy. Please take as directed and contact our office if you believe you are having problem(s) with the medication(s).  Test(s) ordered today. Your results will be released to MyChart (or called to you) after review, usually within 72hours after test completion. If any changes need to be made, you will be notified at that same time.  we'll make referral to echo ultrasound of your heart . Our office will contact you regarding appointment(s) once made.  Please schedule followup in 4 days (09/06/13), call sooner if problems.  Heart Failure Heart failure means your heart has trouble pumping blood. This makes it hard for your body to work well. Heart failure is usually a long-term (chronic) condition. You must take good care of yourself and follow your doctor's treatment plan. HOME CARE  Take your heart medicine as told by your doctor.  Do not stop taking medicine unless your doctor tells you to.  Do not skip any dose of medicine.  Refill your medicines before they run out.  Take other medicines only as told by your doctor or pharmacist.  Stay active if told by your doctor. The elderly and people with severe heart failure should talk with a doctor about physical activity.  Eat heart healthy foods. Choose foods that are without trans fat and are low in saturated fat, cholesterol, and salt (sodium). This includes fresh or frozen fruits and vegetables, fish, lean meats, fat-free or low-fat dairy foods, whole grains, and high-fiber foods. Lentils and dried peas and beans (legumes) are also good choices.  Limit salt if told by your doctor.  Cook in a healthy way. Roast, grill, broil, bake, poach, steam, or stir-fry foods.  Limit fluids as told by your doctor.  Weigh yourself every morning. Do  this after you pee (urinate) and before you eat breakfast. Write down your weight to give to your doctor.  Take your blood pressure and write it down if your doctor tell you to.  Ask your doctor how to check your pulse. Check your pulse as told.  Lose weight if told by your doctor.  Stop smoking or chewing tobacco. Do not use gum or patches that help you quit without your doctor's approval.  Schedule and go to doctor visits as told.  Nonpregnant women should have no more than 1 drink a day. Men should have no more than 2 drinks a day. Talk to your doctor about drinking alcohol.  Stop illegal drug use.  Stay current with shots (immunizations).  Manage your health conditions as told by your doctor.  Learn to manage your stress.  Rest when you are tired.  If it is really hot outside:  Avoid intense activities.  Use air conditioning or fans, or get in a cooler place.  Avoid caffeine and alcohol.  Wear loose-fitting, lightweight, and light-colored clothing.  If it is really cold outside:  Avoid intense activities.  Layer your clothing.  Wear mittens or gloves, a hat, and a scarf when going outside.  Avoid alcohol.  Learn about heart failure and get support as needed.  Get help to maintain or improve your quality of life and your ability to care for yourself as needed. GET HELP IF:   You gain 03 lb/1.4 kg or more in 1 day or 05 lb/2.3 kg in a week.  You are more short of breath than usual.  You cannot do your normal activities.  You tire easily.  You cough more than normal, especially with activity.  You have any or more puffiness (swelling) in areas such as your hands, feet, ankles, or belly (abdomen).  You cannot sleep because it is hard to breathe.  You feel like your heart is beating fast (palpitations).  You get dizzy or lightheaded when you stand up. GET HELP RIGHT AWAY IF:   You have trouble breathing.  There is a change in mental status, such as  becoming less alert or not being able to focus.  You have chest pain or discomfort.  You faint. MAKE SURE YOU:   Understand these instructions.  Will watch your condition.  Will get help right away if you are not doing well or get worse. Document Released: 07/01/2008 Document Revised: 01/17/2013 Document Reviewed: 04/22/2012 [facility] Patient Information 2014 [place], [place].

## 2013-09-02 NOTE — Assessment & Plan Note (Signed)
Lab Results  Component Value Date   HGBA1C 8.9* 08/01/2013   No longer on metformin since 07/11/13 renal dysfunction titration 70/30 TID as ongoing

## 2013-09-02 NOTE — Progress Notes (Signed)
Subjective:    Patient ID: Alice Kemp, female    DOB: 12/31/1950, 62 y.o.   MRN: [id number]  Breathing Problem She complains of difficulty breathing and shortness of breath (with exertion - chronic). There is no wheezing. This is a new problem. The current episode started 1 to 4 weeks ago. The problem occurs constantly. The problem has been rapidly worsening. Associated symptoms include dyspnea on exertion, orthopnea and PND. Pertinent negatives include no appetite change, chest pain, fever, headaches, sneezing or sore throat. Her symptoms are aggravated by any activity and minimal activity. Her symptoms are alleviated by rest. She reports minimal improvement on treatment.   Reviewed interval medical events including pending nephrology evaluation and diabetic advice as provided by health coach at [facility]  Past Medical History  Diagnosis Date  . Hyperlipidemia   . Diabetes mellitus type II     metformin  . Pulmonary hypertension   . Unspecified hypothyroidism   . Breast abscess 07/2011    left; s/p I&D + abx by gen surg  . Arthritis   . Depression   . Special screening for malignant neoplasms, colon 06/29/2012  . Skin lesion of breast 07/09/2012  . PERICARDIAL EFFUSION 12/12/2010    Qualifier: Diagnosis of  By: Manson Passey, RN, BSN, Lauren    . OBSTRUCTIVE SLEEP APNEA 12/23/2010    npsg 2012:  AHI 25/hr, severe desat to 68%.   autoset 2013: optimal pressure 13cm   . Morbid obesity   . Cervical radiculopathy at C6   . Benign neoplasm of colon 06/29/2012  . Anxiety   . Vitamin B12 deficiency     Review of Systems  Constitutional: Negative for fever, chills, activity change and appetite change.  HENT: Negative for sneezing and sore throat.   Respiratory: Positive for shortness of breath (with exertion - chronic). Negative for chest tightness and wheezing.   Cardiovascular: Positive for dyspnea on exertion, leg swelling and PND. Negative for chest pain and palpitations.   Gastrointestinal: Negative for nausea, vomiting, diarrhea and constipation.  Genitourinary: Negative for dysuria, flank pain and difficulty urinating.  Neurological: Negative for dizziness and headaches.       Objective:   Physical Exam  Constitutional: She is oriented to person, place, and time. She appears well-nourished. No distress.  obese  Neck: Normal range of motion. Neck supple. No thyromegaly present.  Cardiovascular: Normal rate, regular rhythm and normal heart sounds.   No murmur heard. Pulmonary/Chest: Respiratory distress: mild resting dyspnea. She has wheezes.  Diminished at bases - exp wheeze  Musculoskeletal: Normal range of motion. She exhibits no tenderness. Edema: 2+ bilateral LE edema.  Lymphadenopathy:    She has no cervical adenopathy.  Neurological: She is alert and oriented to person, place, and time.  Skin: Skin is warm and dry. She is not diaphoretic.  Psychiatric: She has a normal mood and affect. Her behavior is normal. Judgment and thought content normal.    Wt Readings from Last 3 Encounters:  09/02/13 334 lb (151.501 kg)  08/01/13 310 lb 6.4 oz (140.797 kg)  07/11/13 309 lb 12.8 oz (140.524 kg)   BP Readings from Last 3 Encounters:  09/02/13 138/94  08/01/13 142/98  07/11/13 132/80   Lab Results  Component Value Date   WBC 8.7 05/10/2012   HGB 12.3 05/10/2012   HCT 37.0 05/10/2012   PLT 229.0 05/10/2012   GLUCOSE 103* 08/01/2013   CHOL 126 01/26/2013   TRIG 221* 01/26/2013   HDL 38 01/26/2013  LDLDIRECT 54.6 05/10/2012   LDLCALC 44 01/26/2013   ALT 8 01/26/2013   AST 13 01/26/2013   NA 141 08/01/2013   K 3.7 08/01/2013   CL 103 08/01/2013   CREATININE 1.3* 08/01/2013   BUN 12 08/01/2013   CO2 29 08/01/2013   TSH 3.84 01/26/2013   INR 0.98 01/28/2011   HGBA1C 8.9* 08/01/2013       Assessment & Plan:   Acute CHF - see below (edema) Labs today High dose lasix follow up 4 days See problem list. Medications and labs reviewed today.

## 2013-09-02 NOTE — Assessment & Plan Note (Signed)
Concerning for hear failure > renal dz - BLE edema and now O2 sats down with signig=ficant weight gain in short term  Resume lasix (has been on hold x 5 d for CRF) - tx 80 BID Check Bmet to day and again 4 days with OV to review Check 2d echo to eval LV fx Pt to go to ER if worsening/unimproved symptoms in next 72h

## 2013-09-02 NOTE — Progress Notes (Signed)
Pre visit review using our clinic review tool, if applicable. No additional management support is needed unless otherwise documented below in the visit note. 

## 2013-09-06 ENCOUNTER — Ambulatory Visit (INDEPENDENT_AMBULATORY_CARE_PROVIDER_SITE_OTHER): Payer: 59 | Admitting: Internal Medicine

## 2013-09-06 ENCOUNTER — Other Ambulatory Visit (INDEPENDENT_AMBULATORY_CARE_PROVIDER_SITE_OTHER): Payer: 59

## 2013-09-06 ENCOUNTER — Encounter: Payer: Self-pay | Admitting: Internal Medicine

## 2013-09-06 VITALS — BP 122/68 | HR 101 | Temp 98.5°F | Wt 315.0 lb

## 2013-09-06 DIAGNOSIS — R609 Edema, unspecified: Secondary | ICD-10-CM

## 2013-09-06 DIAGNOSIS — E1129 Type 2 diabetes mellitus with other diabetic kidney complication: Secondary | ICD-10-CM

## 2013-09-06 LAB — BASIC METABOLIC PANEL
BUN: 26 mg/dL — ABNORMAL HIGH (ref 6–23)
Chloride: 97 mEq/L (ref 96–112)
Creatinine, Ser: 1.7 mg/dL — ABNORMAL HIGH (ref 0.4–1.2)
GFR: 33.15 mL/min — ABNORMAL LOW (ref >60.00)
Glucose, Bld: 155 mg/dL — ABNORMAL HIGH (ref 70–99)

## 2013-09-06 MED ORDER — FUROSEMIDE 80 MG PO TABS: 80.0000 mg | ORAL_TABLET | Freq: Every day | ORAL | Status: DC

## 2013-09-06 NOTE — Assessment & Plan Note (Signed)
Lab Results  Component Value Date   HGBA1C 7.6* 09/02/2013   No longer on metformin since 07/11/13 dx of renal dysfunction titration 70/30 TID as ongoing

## 2013-09-06 NOTE — Progress Notes (Signed)
Pre-visit discussion using our clinic review tool. No additional management support is needed unless otherwise documented below in the visit note.  

## 2013-09-06 NOTE — Assessment & Plan Note (Signed)
Increase Cr 06/2013 noted following initiation of low dose ACEI 05/2013 follow up creatinine overall unchanged despite no ACEI, no metformin, no NSAIDs and no furosemide > 1 month Resume lasix at high dose as for ?chf - see edema above Pending renal eval -?available sooner - will check continue oral hydration and avoid renal toxic medications Recheck basic metabolic today 

## 2013-09-06 NOTE — Progress Notes (Signed)
  Subjective:    Patient ID: Alice Kemp, female    DOB: [birth year], 62 y.o.   MRN: [id number]  HPI  Here for follow up -reviewed interval medical events   Breathing has significantly improved with increased Lasix last week Decrease in ankle edema, but not resolved   Past Medical History  Diagnosis Date  . Hyperlipidemia   . Diabetes mellitus type II     metformin  . Pulmonary hypertension   . Unspecified hypothyroidism   . Breast abscess 07/2011    left; s/p I&D + abx by gen surg  . Arthritis   . Depression   . Special screening for malignant neoplasms, colon 06/29/2012  . Skin lesion of breast 07/09/2012  . PERICARDIAL EFFUSION 12/12/2010    Qualifier: Diagnosis of  By: Manson Passey, RN, BSN, Lauren    . OBSTRUCTIVE SLEEP APNEA 12/23/2010    npsg 2012:  AHI 25/hr, severe desat to 68%.   autoset 2013: optimal pressure 13cm   . Morbid obesity   . Cervical radiculopathy at C6   . Benign neoplasm of colon 06/29/2012  . Anxiety   . Vitamin B12 deficiency     Review of Systems  Constitutional: Negative for chills and activity change.  Respiratory: Negative for chest tightness.   Cardiovascular: Positive for leg swelling. Negative for palpitations.  Gastrointestinal: Negative for nausea, vomiting, diarrhea and constipation.  Genitourinary: Negative for dysuria, flank pain and difficulty urinating.  Neurological: Negative for dizziness.       Objective:   Physical Exam  Constitutional: She is oriented to person, place, and time. She appears well-nourished. No distress.  obese  Neck: Normal range of motion. Neck supple. No thyromegaly present.  Cardiovascular: Normal rate, regular rhythm and normal heart sounds.   No murmur heard. Pulmonary/Chest: Respiratory distress: mild resting dyspnea. She has no wheezes.  Diminished at bases -but no wheeze  Musculoskeletal: Normal range of motion. She exhibits no tenderness. Edema: 1+ bilateral LE edema at ankles.  Lymphadenopathy:    She  has no cervical adenopathy.  Neurological: She is alert and oriented to person, place, and time.  Skin: Skin is warm and dry. She is not diaphoretic.  Psychiatric: She has a normal mood and affect. Her behavior is normal. Judgment and thought content normal.    Wt Readings from Last 3 Encounters:  09/06/13 315 lb (142.883 kg)  09/02/13 334 lb (151.501 kg)  08/01/13 310 lb 6.4 oz (140.797 kg)   BP Readings from Last 3 Encounters:  09/06/13 122/68  09/02/13 138/94  08/01/13 142/98   Lab Results  Component Value Date   WBC 8.7 05/10/2012   HGB 12.3 05/10/2012   HCT 37.0 05/10/2012   PLT 229.0 05/10/2012   GLUCOSE 117* 09/02/2013   CHOL 126 01/26/2013   TRIG 221* 01/26/2013   HDL 38 01/26/2013   LDLDIRECT 54.6 05/10/2012   LDLCALC 44 01/26/2013   ALT 8 01/26/2013   AST 13 01/26/2013   NA 140 09/02/2013   K 4.0 09/02/2013   CL 106 09/02/2013   CREATININE 1.3* 09/02/2013   BUN 15 09/02/2013   CO2 30 09/02/2013   TSH 3.84 01/26/2013   INR 0.98 01/28/2011   HGBA1C 7.6* 09/02/2013       Assessment & Plan:   Acute diastolic CHF, improved since last week - decrease edema, weight down 19# Labs today reduce lasix to 80 mg qd follow up 2 weeks  Also see problem list. Medications and labs reviewed today.

## 2013-09-06 NOTE — Assessment & Plan Note (Signed)
Concerning for hear failure > renal dz -  Last week treated with high dose 80 BID Check Bmet today  decrease to 80mg  qd now given weight loss 19# in 4 days Pending appt for 2d echo to eval LV fx

## 2013-09-06 NOTE — Patient Instructions (Addendum)
It was good to see you today.  We have reviewed your prior records including labs and tests today  take lasix 80mg  once daily until further notice  Your prescription(s) have been submitted to your pharmacy. Please take as directed and contact our office if you believe you are having problem(s) with the medication(s).  Test(s) ordered today. Your results will be released to MyChart (or called to you) after review, usually within 72hours after test completion. If any changes need to be made, you will be notified at that same time.  follow up on scheduled echo (ultrasound of your heart). Our office will contact you regarding appointment(s) once made.  Please schedule followup in 2 weeks, call sooner if problems.  Chronic Kidney Disease Chronic kidney disease occurs when the kidneys are damaged over a long period. The kidneys are two organs that lie on either side of the spine between the middle of the back and the front of the abdomen. The kidneys:   Remove wastes and extra water from the blood.   Produce important hormones. These help keep bones strong, regulate blood pressure, and help create red blood cells.   Balance the fluids and chemicals in the blood and tissues. A [place] amount of kidney damage may not cause problems, but a large amount of damage may make it difficult or impossible for the kidneys to work the way they should. If steps are not taken to slow down the kidney damage or stop it from getting worse, the kidneys may stop working permanently. Most of the time, chronic kidney disease does not go away. However, it can often be controlled, and those with the disease can usually live normal lives. CAUSES  The most common causes of chronic kidney disease are diabetes and high blood pressure (hypertension). Chronic kidney disease may also be caused by:   Diseases that cause kidneys' filters to become inflamed.   Diseases that affect the immune system.   Genetic diseases.    Medicines that damage the kidneys, such as anti-inflammatory medicines.  Poisoning or exposure to toxic substances.   A reoccurring kidney or urinary infection.   A problem with urine flow. This may be caused by:   Cancer.   Kidney stones.   An enlarged prostate in males. SYMPTOMS  Because the kidney damage in chronic kidney disease occurs slowly, symptoms develop slowly and may not be obvious until the kidney damage becomes severe. A person may have a kidney disease for years without showing any symptoms. Symptoms can include:   Swelling (edema) of the legs, ankles, or feet.   Tiredness (lethargy).   Nausea or vomiting.   Confusion.   Problems with urination, such as:   Decreased urine production.   Frequent urination, especially at night.   Frequent accidents in children who are potty trained.   Muscle twitches and cramps.   Shortness of breath.  Weakness.   Persistent itchiness.   Loss of appetite.  Metallic taste in the mouth.  Trouble sleeping.  Slowed development in children.  Short stature in children. DIAGNOSIS  Chronic kidney disease may be detected and diagnosed by tests, including blood, urine, imaging, or kidney biopsy tests.  TREATMENT  Most chronic kidney diseases cannot be cured. Treatment usually involves relieving symptoms and preventing or slowing the progression of the disease. Treatment may include:   A special diet. You may need to avoid alcohol and foods thatare salty and high in potassium.   Medicines. These may:   Lower blood pressure.  Relieve anemia.   Relieve swelling.   Protect the bones. HOME CARE INSTRUCTIONS   Follow your prescribed diet.   Only take over-the-counter or prescription medicines as directed by your caregiver.  Do not take any new medicines (prescription, over-the-counter, or nutritional supplements) unless approved by your caregiver. Many medicines can worsen your  kidney damage or need to have the dose adjusted.   Quit smoking if you are a smoker. Talk to your caregiver about a smoking cessation program.   Keep all follow-up appointments as directed by your caregiver. SEEK IMMEDIATE MEDICAL CARE IF:  Your symptoms get worse or you develop new symptoms.   You develop symptoms of end-stage kidney disease. These include:   Headaches.   Abnormally dark or light skin.   Numbness in the hands or feet.   Easy bruising.   Frequent hiccups.   Menstruation stops.   You have a fever.   You have decreased urine production.   You havepain or bleeding when urinating. MAKE SURE YOU:  Understand these instructions.  Will watch your condition.  Will get help right away if you are not doing well or get worse. FOR MORE INFORMATION  American Association of Kidney Patients: ResidentialShow.is National Kidney Foundation: www.kidney.org American Kidney Fund: FightingMatch.com.ee Life Options Rehabilitation Program: www.lifeoptions.org and www.kidneyschool.org Document Released: 07/01/2008 Document Revised: 09/08/2012 Document Reviewed: 05/21/2012 [facility] Patient Information 2014 [place], [place].

## 2013-09-20 ENCOUNTER — Other Ambulatory Visit (HOSPITAL_COMMUNITY): Payer: Self-pay | Admitting: Internal Medicine

## 2013-09-20 ENCOUNTER — Ambulatory Visit (HOSPITAL_COMMUNITY): Payer: 59 | Attending: Internal Medicine | Admitting: Radiology

## 2013-09-20 DIAGNOSIS — R609 Edema, unspecified: Secondary | ICD-10-CM

## 2013-09-20 DIAGNOSIS — G4733 Obstructive sleep apnea (adult) (pediatric): Secondary | ICD-10-CM | POA: Insufficient documentation

## 2013-09-20 DIAGNOSIS — E785 Hyperlipidemia, unspecified: Secondary | ICD-10-CM | POA: Insufficient documentation

## 2013-09-20 DIAGNOSIS — N189 Chronic kidney disease, unspecified: Secondary | ICD-10-CM | POA: Insufficient documentation

## 2013-09-20 DIAGNOSIS — I129 Hypertensive chronic kidney disease with stage 1 through stage 4 chronic kidney disease, or unspecified chronic kidney disease: Secondary | ICD-10-CM | POA: Insufficient documentation

## 2013-09-20 DIAGNOSIS — E119 Type 2 diabetes mellitus without complications: Secondary | ICD-10-CM | POA: Insufficient documentation

## 2013-09-20 MED ORDER — PERFLUTREN PROTEIN A MICROSPH IV SUSP
0.5000 mL | Freq: Once | INTRAVENOUS | Status: AC
  Administered 2013-09-20: 0.5 mL via INTRAVENOUS

## 2013-09-20 NOTE — Progress Notes (Signed)
Echocardiogram performed with [facility].  

## 2013-09-22 ENCOUNTER — Ambulatory Visit: Payer: 59 | Admitting: Internal Medicine

## 2013-09-23 ENCOUNTER — Encounter: Payer: Self-pay | Admitting: Internal Medicine

## 2013-09-23 ENCOUNTER — Other Ambulatory Visit (INDEPENDENT_AMBULATORY_CARE_PROVIDER_SITE_OTHER): Payer: 59

## 2013-09-23 ENCOUNTER — Ambulatory Visit (INDEPENDENT_AMBULATORY_CARE_PROVIDER_SITE_OTHER): Payer: 59 | Admitting: Internal Medicine

## 2013-09-23 VITALS — BP 132/82 | HR 92 | Temp 98.0°F | Wt 311.8 lb

## 2013-09-23 DIAGNOSIS — I5032 Chronic diastolic (congestive) heart failure: Secondary | ICD-10-CM

## 2013-09-23 DIAGNOSIS — E039 Hypothyroidism, unspecified: Secondary | ICD-10-CM

## 2013-09-23 LAB — CBC WITH DIFFERENTIAL/PLATELET
Basophils Absolute: 0 10*3/uL (ref 0.0–0.1)
Eosinophils Absolute: 0.2 10*3/uL (ref 0.0–0.7)
HCT: 38.5 % (ref 36.0–46.0)
Hemoglobin: 12.7 g/dL (ref 12.0–15.0)
Lymphocytes Relative: 29.8 % (ref 12.0–46.0)
MCHC: 33 g/dL (ref 30.0–36.0)
MCV: 84.1 fl (ref 78.0–100.0)
Monocytes Absolute: 0.6 10*3/uL (ref 0.1–1.0)
Neutro Abs: 5.4 10*3/uL (ref 1.4–7.7)
Neutrophils Relative %: 61.1 % (ref 43.0–77.0)
Platelets: 279 10*3/uL (ref 150.0–400.0)
RBC: 4.58 Mil/uL (ref 3.87–5.11)
RDW: 15.6 % — ABNORMAL HIGH (ref 11.5–14.6)
WBC: 8.9 10*3/uL (ref 4.5–10.5)

## 2013-09-23 LAB — BASIC METABOLIC PANEL
BUN: 19 mg/dL (ref 6–23)
CO2: 30 mEq/L (ref 19–32)
Calcium: 8.8 mg/dL (ref 8.4–10.5)
Creatinine, Ser: 1.3 mg/dL — ABNORMAL HIGH (ref 0.4–1.2)
Potassium: 3.7 mEq/L (ref 3.5–5.1)

## 2013-09-23 LAB — TSH: TSH: 1.65 u[IU]/mL (ref 0.35–5.50)

## 2013-09-23 MED ORDER — GLIPIZIDE 5 MG PO TABS: 5.0000 mg | ORAL_TABLET | Freq: Two times a day (BID) | ORAL | Status: DC

## 2013-09-23 MED ORDER — LOSARTAN POTASSIUM 25 MG PO TABS: 25.0000 mg | ORAL_TABLET | Freq: Every day | ORAL | Status: DC

## 2013-09-23 NOTE — Assessment & Plan Note (Signed)
Increase Cr 06/2013 noted following initiation of low dose ACEI 05/2013 follow up creatinine overall unchanged despite no ACEI, no metformin, no NSAIDs and no furosemide > 1 month Resumed lasixlaste 08/2013 for chf - see below Pending renal eval -referred 07/2013 -?scheduled - will check continue oral hydration and avoid renal toxic medications Recheck basic metabolic today

## 2013-09-23 NOTE — Assessment & Plan Note (Signed)
Lab Results  Component Value Date   HGBA1C 7.6* 09/02/2013   No longer on metformin since 07/11/13 dx of renal dysfunction titration 70/30 TID as ongoing Add glipize at pt request as has done will with same in the past

## 2013-09-23 NOTE — Patient Instructions (Signed)
It was good to see you today.  We have reviewed your prior records including labs and tests today  We reviewed and updated your medications.  Begin glipizide once daily and will start low-dose losartan for heart once daily -no other changes  Your prescription(s) have been submitted to your pharmacy. Please take as directed and contact our office if you believe you are having problem(s) with the medication(s).  Test(s) ordered today. Your results will be released to MyChart (or called to you) after review, usually within 72hours after test completion. If any changes need to be made, you will be notified at that same time. .  Please schedule followup in 6 weeks, call sooner if problems.  Chronic Kidney Disease Chronic kidney disease occurs when the kidneys are damaged over a long period. The kidneys are two organs that lie on either side of the spine between the middle of the back and the front of the abdomen. The kidneys:   Remove wastes and extra water from the blood.   Produce important hormones. These help keep bones strong, regulate blood pressure, and help create red blood cells.   Balance the fluids and chemicals in the blood and tissues. A [place] amount of kidney damage may not cause problems, but a large amount of damage may make it difficult or impossible for the kidneys to work the way they should. If steps are not taken to slow down the kidney damage or stop it from getting worse, the kidneys may stop working permanently. Most of the time, chronic kidney disease does not go away. However, it can often be controlled, and those with the disease can usually live normal lives. CAUSES  The most common causes of chronic kidney disease are diabetes and high blood pressure (hypertension). Chronic kidney disease may also be caused by:   Diseases that cause kidneys' filters to become inflamed.   Diseases that affect the immune system.   Genetic diseases.   Medicines that damage  the kidneys, such as anti-inflammatory medicines.  Poisoning or exposure to toxic substances.   A reoccurring kidney or urinary infection.   A problem with urine flow. This may be caused by:   Cancer.   Kidney stones.   An enlarged prostate in males. SYMPTOMS  Because the kidney damage in chronic kidney disease occurs slowly, symptoms develop slowly and may not be obvious until the kidney damage becomes severe. A person may have a kidney disease for years without showing any symptoms. Symptoms can include:   Swelling (edema) of the legs, ankles, or feet.   Tiredness (lethargy).   Nausea or vomiting.   Confusion.   Problems with urination, such as:   Decreased urine production.   Frequent urination, especially at night.   Frequent accidents in children who are potty trained.   Muscle twitches and cramps.   Shortness of breath.  Weakness.   Persistent itchiness.   Loss of appetite.  Metallic taste in the mouth.  Trouble sleeping.  Slowed development in children.  Short stature in children. DIAGNOSIS  Chronic kidney disease may be detected and diagnosed by tests, including blood, urine, imaging, or kidney biopsy tests.  TREATMENT  Most chronic kidney diseases cannot be cured. Treatment usually involves relieving symptoms and preventing or slowing the progression of the disease. Treatment may include:   A special diet. You may need to avoid alcohol and foods thatare salty and high in potassium.   Medicines. These may:   Lower blood pressure.   Relieve anemia.  Relieve swelling.   Protect the bones. HOME CARE INSTRUCTIONS   Follow your prescribed diet.   Only take over-the-counter or prescription medicines as directed by your caregiver.  Do not take any new medicines (prescription, over-the-counter, or nutritional supplements) unless approved by your caregiver. Many medicines can worsen your kidney damage or need to have  the dose adjusted.   Quit smoking if you are a smoker. Talk to your caregiver about a smoking cessation program.   Keep all follow-up appointments as directed by your caregiver. SEEK IMMEDIATE MEDICAL CARE IF:  Your symptoms get worse or you develop new symptoms.   You develop symptoms of end-stage kidney disease. These include:   Headaches.   Abnormally dark or light skin.   Numbness in the hands or feet.   Easy bruising.   Frequent hiccups.   Menstruation stops.   You have a fever.   You have decreased urine production.   You havepain or bleeding when urinating. MAKE SURE YOU:  Understand these instructions.  Will watch your condition.  Will get help right away if you are not doing well or get worse. FOR MORE INFORMATION  American Association of Kidney Patients: ResidentialShow.is National Kidney Foundation: www.kidney.org American Kidney Fund: FightingMatch.com.ee Life Options Rehabilitation Program: www.lifeoptions.org and www.kidneyschool.org Document Released: 07/01/2008 Document Revised: 09/08/2012 Document Reviewed: 05/21/2012 [facility] Patient Information 2014 [place], [place].

## 2013-09-23 NOTE — Progress Notes (Signed)
Subjective:    Patient ID: Alice Kemp, female    DOB: 1951/01/15, 62 y.o.   MRN: [id number]  HPI  Here for follow up -reviewed interval medical events   Breathing has significantly improved with increased Lasix 08/2013 Decrease in ankle edema, almost resolved  Sugars remain high   Past Medical History  Diagnosis Date  . Hyperlipidemia   . Diabetes mellitus type II     metformin  . Pulmonary hypertension   . Unspecified hypothyroidism   . Breast abscess 07/2011    left; s/p I&D + abx by gen surg  . Arthritis   . Depression   . Special screening for malignant neoplasms, colon 06/29/2012  . Skin lesion of breast 07/09/2012  . PERICARDIAL EFFUSION 12/12/2010    Qualifier: Diagnosis of  By: Manson Passey, RN, BSN, Lauren    . OBSTRUCTIVE SLEEP APNEA 12/23/2010    npsg 2012:  AHI 25/hr, severe desat to 68%.   autoset 2013: optimal pressure 13cm   . Morbid obesity   . Cervical radiculopathy at C6   . Benign neoplasm of colon 06/29/2012  . Anxiety   . Vitamin B12 deficiency     Review of Systems  Constitutional: Negative for chills and activity change.  Respiratory: Negative for chest tightness.   Cardiovascular: Positive for leg swelling. Negative for palpitations.  Gastrointestinal: Negative for nausea, vomiting, diarrhea and constipation.  Genitourinary: Negative for dysuria, flank pain and difficulty urinating.  Neurological: Negative for dizziness.       Objective:   Physical Exam  Constitutional: She is oriented to person, place, and time. She appears well-nourished. No distress.  obese  Neck: Normal range of motion. Neck supple. No thyromegaly present.  Cardiovascular: Normal rate, regular rhythm and normal heart sounds.   No murmur heard. Pulmonary/Chest: Respiratory distress: mild resting dyspnea. She has no wheezes.  Diminished at bases -but no wheeze  Musculoskeletal: Normal range of motion. She exhibits no tenderness. Edema: 1+ bilateral LE edema at ankles.   Lymphadenopathy:    She has no cervical adenopathy.  Neurological: She is alert and oriented to person, place, and time.  Skin: Skin is warm and dry. She is not diaphoretic.  Psychiatric: She has a normal mood and affect. Her behavior is normal. Judgment and thought content normal.    Wt Readings from Last 3 Encounters:  09/23/13 311 lb 12.8 oz (141.432 kg)  09/06/13 315 lb (142.883 kg)  09/02/13 334 lb (151.501 kg)    Lab Results  Component Value Date   WBC 8.7 05/10/2012   HGB 12.3 05/10/2012   HCT 37.0 05/10/2012   PLT 229.0 05/10/2012   GLUCOSE 155* 09/06/2013   CHOL 126 01/26/2013   TRIG 221* 01/26/2013   HDL 38 01/26/2013   LDLDIRECT 54.6 05/10/2012   LDLCALC 44 01/26/2013   ALT 8 01/26/2013   AST 13 01/26/2013   NA 138 09/06/2013   K 3.6 09/06/2013   CL 97 09/06/2013   CREATININE 1.7* 09/06/2013   BUN 26* 09/06/2013   CO2 31 09/06/2013   TSH 3.84 01/26/2013   INR 0.98 01/28/2011   HGBA1C 7.6* 09/02/2013   Echo 09/20/13: ------------------------------------------------------------ Study Conclusions  - Left ventricle: The cavity size was normal. Wall thickness was increased in a pattern of mild LVH. Systolic function was vigorous. The estimated ejection fraction was in the range of 65% to 70%. Wall motion was normal; there were no regional wall motion abnormalities. Doppler parameters are consistent with abnormal left ventricular relaxation (grade 1  diastolic dysfunction). - Pericardium, extracardiac: A [place] pericardial effusion was identified.     Assessment & Plan:   see problem list. Medications and labs reviewed today.

## 2013-09-23 NOTE — Progress Notes (Signed)
Pre-visit discussion using our clinic review tool. No additional management support is needed unless otherwise documented below in the visit note.  

## 2013-09-23 NOTE — Assessment & Plan Note (Signed)
New dx 01/2011 - follows with endo for same Check tsh now given edema/new CHF The current medical regimen is effective;  continue present plan and medications. Lab Results  Component Value Date   TSH 3.84 01/26/2013

## 2013-09-23 NOTE — Assessment & Plan Note (Signed)
Volume overload with edema 08/2013 concerning for heart failure > renal dz -  Last 08/2013 treated with high dose 80 BID Check Bmet today  decrease to 80mg  early 09/2013 now given weight loss >30# with diuretics

## 2013-09-26 ENCOUNTER — Other Ambulatory Visit: Payer: Self-pay | Admitting: Internal Medicine

## 2013-10-21 ENCOUNTER — Encounter: Payer: Self-pay | Admitting: Internal Medicine

## 2013-10-21 ENCOUNTER — Ambulatory Visit (INDEPENDENT_AMBULATORY_CARE_PROVIDER_SITE_OTHER): Payer: 59 | Admitting: Internal Medicine

## 2013-10-21 VITALS — BP 120/72 | HR 96 | Temp 98.3°F | Wt 309.0 lb

## 2013-10-21 DIAGNOSIS — F3289 Other specified depressive episodes: Secondary | ICD-10-CM

## 2013-10-21 DIAGNOSIS — F329 Major depressive disorder, single episode, unspecified: Secondary | ICD-10-CM

## 2013-10-21 DIAGNOSIS — R002 Palpitations: Secondary | ICD-10-CM | POA: Insufficient documentation

## 2013-10-21 DIAGNOSIS — F32A Depression, unspecified: Secondary | ICD-10-CM

## 2013-10-21 NOTE — Assessment & Plan Note (Signed)
Patient describes 48 hours of symptomatic heart racing, now spontaneously resolved Heart rate is regular exam, no symptoms Unclear trigger on history Recent labs reviewed and no precipitating etiology identified Discussed potential workup for further evaluation to look for arrhythmia such as SVT and patient declines given asymptomatic state at this time and no prior history of same Patient agrees to call if recurrent symptoms, or to the emergency room if severe/uncontrolled Offered low-dose beta blocker, patient declines same due to chronic fatigue and concern for potential side effects

## 2013-10-21 NOTE — Progress Notes (Signed)
Subjective:    Patient ID: Alice Kemp, female    DOB: 1951/08/02, 63 y.o.   MRN: [id number]  HPI  See CC - concerned about rapid HR  Past Medical History  Diagnosis Date  . Hyperlipidemia   . Diabetes mellitus type II     metformin  . Pulmonary hypertension   . Unspecified hypothyroidism   . Breast abscess 07/2011    left; s/p I&D + abx by gen surg  . Arthritis   . Depression   . Special screening for malignant neoplasms, colon 06/29/2012  . Skin lesion of breast 07/09/2012  . PERICARDIAL EFFUSION 12/12/2010    Qualifier: Diagnosis of  By: Owens Shark, RN, BSN, Lauren    . OBSTRUCTIVE SLEEP APNEA 12/23/2010    npsg 2012:  AHI 25/hr, severe desat to 68%.   autoset 2013: optimal pressure 13cm   . Morbid obesity   . Cervical radiculopathy at C6   . Benign neoplasm of colon 06/29/2012  . Anxiety   . Vitamin B12 deficiency   . Diastolic heart failure 33/2951 echo    Review of Systems  Constitutional: Positive for fatigue. Negative for unexpected weight change.  Respiratory: Negative for chest tightness.   Cardiovascular: Positive for palpitations. Negative for chest pain and leg swelling.  Gastrointestinal: Negative for nausea, vomiting, diarrhea and constipation.  Genitourinary: Negative for dysuria, flank pain and difficulty urinating.  Psychiatric/Behavioral: Positive for dysphoric mood. Nervous/anxious: ? - concern about continued unemployment.        Objective:   Physical Exam BP 120/72  Pulse 96  Temp(Src) 98.3 F (36.8 C) (Oral)  Wt 309 lb (140.161 kg)  SpO2 93% Wt Readings from Last 3 Encounters:  10/21/13 309 lb (140.161 kg)  09/23/13 311 lb 12.8 oz (141.432 kg)  09/06/13 315 lb (142.883 kg)   Constitutional: She is MO, but appears well-developed and well-nourished. No distress.  Neck: Normal range of motion. Neck supple. No JVD present. No thyromegaly present.  Cardiovascular: Normal rate, regular rhythm and normal heart sounds.  No murmur heard. No BLE  edema. Pulmonary/Chest: Effort normal and breath sounds normal. No respiratory distress. She has no wheezes.  Skin: Skin is warm and dry. No rash noted. No erythema.  Psychiatric: She has a normal mood and affect. Her behavior is normal. Judgment and thought content normal.   Lab Results  Component Value Date   WBC 8.9 09/23/2013   HGB 12.7 09/23/2013   HCT 38.5 09/23/2013   PLT 279.0 09/23/2013   GLUCOSE 122* 09/23/2013   CHOL 126 01/26/2013   TRIG 221* 01/26/2013   HDL 38 01/26/2013   LDLDIRECT 54.6 05/10/2012   LDLCALC 44 01/26/2013   ALT 8 01/26/2013   AST 13 01/26/2013   NA 141 09/23/2013   K 3.7 09/23/2013   CL 104 09/23/2013   CREATININE 1.3* 09/23/2013   BUN 19 09/23/2013   CO2 30 09/23/2013   TSH 1.65 09/23/2013   INR 0.98 01/28/2011   HGBA1C 7.6* 09/02/2013       Assessment & Plan:    See problem list. Medications and labs reviewed today.  Time spent with pt today 30 minutes, greater than 50% time spent counseling patient on differential of paroxysmal tachycardia, depression and medication review. Also review of prior records   ----PLEASE DISREGARD ANYTHING BELOW THIS POINT in this note ----     Subjective:    Patient ID: Alice Kemp, female    DOB: [birth year], 63 y.o.   MRN: [id number]  HPI  Here for rapid HR x 2 days, but spont resolved in last 24h associated with palpitations, present at rest and exertion No precipitating medications  Sugars remain high   Past Medical History  Diagnosis Date  . Hyperlipidemia   . Diabetes mellitus type II     metformin  . Pulmonary hypertension   . Unspecified hypothyroidism   . Breast abscess 07/2011    left; s/p I&D + abx by gen surg  . Arthritis   . Depression   . Special screening for malignant neoplasms, colon 06/29/2012  . Skin lesion of breast 07/09/2012  . PERICARDIAL EFFUSION 12/12/2010    Qualifier: Diagnosis of  By: Owens Shark, RN, BSN, Lauren    . OBSTRUCTIVE SLEEP APNEA 12/23/2010    npsg 2012:  AHI  25/hr, severe desat to 68%.   autoset 2013: optimal pressure 13cm   . Morbid obesity   . Cervical radiculopathy at C6   . Benign neoplasm of colon 06/29/2012  . Anxiety   . Vitamin B12 deficiency   . Diastolic heart failure [id number] echo    Review of Systems  Constitutional: Negative for chills and activity change.  Respiratory: Negative for chest tightness.   Cardiovascular: Positive for leg swelling. Negative for palpitations.  Gastrointestinal: Negative for nausea, vomiting, diarrhea and constipation.  Genitourinary: Negative for dysuria, flank pain and difficulty urinating.  Neurological: Negative for dizziness.       Objective:   Physical Exam  Constitutional: She is oriented to person, place, and time. She appears well-nourished. No distress.  obese  Neck: Normal range of motion. Neck supple. No thyromegaly present.  Cardiovascular: Normal rate, regular rhythm and normal heart sounds.   No murmur heard. Pulmonary/Chest: Respiratory distress: mild resting dyspnea. She has no wheezes.  Diminished at bases -but no wheeze  Musculoskeletal: Normal range of motion. She exhibits no tenderness. Edema: 1+ bilateral LE edema at ankles.  Lymphadenopathy:    She has no cervical adenopathy.  Neurological: She is alert and oriented to person, place, and time.  Skin: Skin is warm and dry. She is not diaphoretic.  Psychiatric: She has a normal mood and affect. Her behavior is normal. Judgment and thought content normal.   ----PLEASE disregard above formated PHYSICAL EXAM----   Wt Readings from Last 3 Encounters:  10/21/13 309 lb (140.161 kg)  09/23/13 311 lb 12.8 oz (141.432 kg)  09/06/13 315 lb (142.883 kg)   Constitutional: She is MO, but appears well-developed and well-nourished. No distress.  Neck: Normal range of motion. Neck supple. No JVD present. No thyromegaly present.  Cardiovascular: Normal rate, regular rhythm and normal heart sounds.  No murmur heard. trace BLE  edema. Pulmonary/Chest: Effort normal and breath sounds normal. No respiratory distress. She has no wheezes.  Skin: Skin is warm and dry. No rash noted. No erythema.  Psychiatric: She has a dysphoric mood and affect. Her behavior is normal. Judgment and thought content normal.   Lab Results  Component Value Date   WBC 8.9 09/23/2013   HGB 12.7 09/23/2013   HCT 38.5 09/23/2013   PLT 279.0 09/23/2013   GLUCOSE 122* 09/23/2013   CHOL 126 01/26/2013   TRIG 221* 01/26/2013   HDL 38 01/26/2013   LDLDIRECT 54.6 05/10/2012   LDLCALC 44 01/26/2013   ALT 8 01/26/2013   AST 13 01/26/2013   NA 141 09/23/2013   K 3.7 09/23/2013   CL 104 09/23/2013   CREATININE 1.3* 09/23/2013   BUN 19 09/23/2013   CO2 30  09/23/2013   TSH 1.65 09/23/2013   INR 0.98 01/28/2011   HGBA1C 7.6* 09/02/2013   Echo 09/20/13: ------------------------------------------------------------ Study Conclusions  - Left ventricle: The cavity size was normal. Wall thickness was increased in a pattern of mild LVH. Systolic function was vigorous. The estimated ejection fraction was in the range of 65% to 70%. Wall motion was normal; there were no regional wall motion abnormalities. Doppler parameters are consistent with abnormal left ventricular relaxation (grade 1 diastolic dysfunction). - Pericardium, extracardiac: A [place] pericardial effusion was identified.     Assessment & Plan:   see problem list. Medications and labs reviewed today.

## 2013-10-21 NOTE — Assessment & Plan Note (Signed)
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

## 2013-10-21 NOTE — Patient Instructions (Addendum)
It was good to see you today.  We have reviewed your prior records including labs and tests today  Medications reviewed and updated, no changes recommended at this time.  If recurrent heart racing, call for further testing as discussed - go to [place] if severe symptoms  Palpitations  A palpitation is the feeling that your heartbeat is irregular. It may feel like your heart is fluttering or skipping a beat. It may also feel like your heart is beating faster than normal. This is usually not a serious problem. In some cases, you may need more medical tests. HOME CARE  Avoid:  Caffeine in coffee, tea, soft drinks, diet pills, and energy drinks.  Chocolate.  Alcohol.  Stop smoking if you smoke.  Reduce your stress and anxiety. Try:  A method that measures bodily functions so you can learn to control them (biofeedback).  Yoga.  Meditation.  Physical activity such as swimming, jogging, or walking.  Get plenty of rest and sleep. GET HELP RIGHT AWAY IF:   You have chest pain.  You feel short of breath.  You have a very bad headache.  You feel dizzy or pass out (faint).  Your fast or irregular heartbeat continues after 24 hours.  Your palpitations occur more often. MAKE SURE YOU:   Understand these instructions.  Will watch your condition.  Will get help right away if you are not doing well or get worse. Document Released: 07/01/2008 Document Revised: 03/23/2012 Document Reviewed: 11/21/2011 [facility] Patient Information 2014 [place]. Palpitations  A palpitation is the feeling that your heartbeat is irregular. It may feel like your heart is fluttering or skipping a beat. It may also feel like your heart is beating faster than normal. This is usually not a serious problem. In some cases, you may need more medical tests. HOME CARE  Avoid:  Caffeine in coffee, tea, soft drinks, diet pills, and energy drinks.  Chocolate.  Alcohol.  Stop smoking if you  smoke.  Reduce your stress and anxiety. Try:  A method that measures bodily functions so you can learn to control them (biofeedback).  Yoga.  Meditation.  Physical activity such as swimming, jogging, or walking.  Get plenty of rest and sleep. GET HELP RIGHT AWAY IF:   You have chest pain.  You feel short of breath.  You have a very bad headache.  You feel dizzy or pass out (faint).  Your fast or irregular heartbeat continues after 24 hours.  Your palpitations occur more often. MAKE SURE YOU:   Understand these instructions.  Will watch your condition.  Will get help right away if you are not doing well or get worse. Document Released: 07/01/2008 Document Revised: 03/23/2012 Document Reviewed: 11/21/2011 [facility] Patient Information 2014 [place].

## 2013-10-24 ENCOUNTER — Telehealth: Payer: Self-pay | Admitting: *Deleted

## 2013-10-24 NOTE — Telephone Encounter (Signed)
Good news, thanks for the update!

## 2013-10-24 NOTE — Telephone Encounter (Signed)
Left msg on vm stating wanting to let md know she finally got an appt to [facility]! Have appt schedule for this Friday 10/28/13 @ 11:15...Johny Chess

## 2013-10-28 ENCOUNTER — Other Ambulatory Visit: Payer: Self-pay | Admitting: Nephrology

## 2013-10-28 DIAGNOSIS — N183 Chronic kidney disease, stage 3 unspecified: Secondary | ICD-10-CM

## 2013-10-28 DIAGNOSIS — N179 Acute kidney failure, unspecified: Secondary | ICD-10-CM

## 2013-10-31 ENCOUNTER — Ambulatory Visit
Admission: RE | Admit: 2013-10-31 | Discharge: 2013-10-31 | Disposition: A | Discharge status: Home / Self Care | Payer: 59 | Source: Ambulatory Visit | Attending: Nephrology | Admitting: Nephrology

## 2013-10-31 ENCOUNTER — Other Ambulatory Visit: Payer: 59

## 2013-10-31 DIAGNOSIS — N179 Acute kidney failure, unspecified: Secondary | ICD-10-CM

## 2013-10-31 DIAGNOSIS — N183 Chronic kidney disease, stage 3 unspecified: Secondary | ICD-10-CM

## 2013-11-01 ENCOUNTER — Other Ambulatory Visit: Payer: Self-pay | Admitting: Internal Medicine

## 2013-11-01 ENCOUNTER — Telehealth: Payer: Self-pay

## 2013-11-01 NOTE — Telephone Encounter (Signed)
The pharmacy called and stated the insulin that was ordered for the patient is an incorrect dose.  The patient was at the pharmacy waiting, however, the office was already shut down. I informed the pharmacist i would send a message and ask for clarification and the patient could pick it up in the morning.   Please clarify the insulin dose and relay that information the pharmacy as soon as possible.

## 2013-11-02 MED ORDER — NOVOLOG MIX 70/30 FLEXPEN (70-30) 100 UNIT/ML ~~LOC~~ SUPN: PEN_INJECTOR | SUBCUTANEOUS | Status: DC

## 2013-11-02 NOTE — Telephone Encounter (Signed)
Called pharmacy spoke with sandy verified how much insulin pt is taking. Also pt is wanting a 90 day. [name] states for 90 day she will need 9 Boxes. Ok refill...Johny Chess

## 2013-11-22 ENCOUNTER — Encounter: Payer: 59 | Admitting: Cardiovascular Disease

## 2013-12-05 ENCOUNTER — Encounter: Payer: 59 | Admitting: Cardiovascular Disease

## 2014-04-05 ENCOUNTER — Telehealth: Payer: Self-pay | Admitting: *Deleted

## 2014-04-05 ENCOUNTER — Other Ambulatory Visit: Payer: Self-pay | Admitting: Internal Medicine

## 2014-04-05 DIAGNOSIS — E1165 Type 2 diabetes mellitus with hyperglycemia: Principal | ICD-10-CM

## 2014-04-05 DIAGNOSIS — IMO0001 Reserved for inherently not codable concepts without codable children: Secondary | ICD-10-CM

## 2014-04-05 NOTE — Telephone Encounter (Signed)
Left message on machine for patient to come to office for labs.  Lipid, a1c, bmet ordered.

## 2014-04-10 ENCOUNTER — Other Ambulatory Visit: Payer: Self-pay | Admitting: Internal Medicine

## 2014-04-11 ENCOUNTER — Other Ambulatory Visit (INDEPENDENT_AMBULATORY_CARE_PROVIDER_SITE_OTHER): Payer: BC Managed Care – PPO

## 2014-04-11 DIAGNOSIS — IMO0001 Reserved for inherently not codable concepts without codable children: Secondary | ICD-10-CM

## 2014-04-11 DIAGNOSIS — E1165 Type 2 diabetes mellitus with hyperglycemia: Principal | ICD-10-CM

## 2014-04-11 LAB — BASIC METABOLIC PANEL
BUN: 30 mg/dL — ABNORMAL HIGH (ref 6–23)
CO2: 26 mEq/L (ref 19–32)
Calcium: 8.9 mg/dL (ref 8.4–10.5)
Chloride: 97 mEq/L (ref 96–112)
Creatinine, Ser: 2.2 mg/dL — ABNORMAL HIGH (ref 0.4–1.2)
GFR: 23.78 mL/min — AB (ref >60.00)
Glucose, Bld: 448 mg/dL — ABNORMAL HIGH (ref 70–99)
Potassium: 4.4 mEq/L (ref 3.5–5.1)
SODIUM: 133 meq/L — AB (ref 135–145)

## 2014-04-11 LAB — LIPID PANEL
CHOL/HDL RATIO: 3
Cholesterol: 118 mg/dL (ref 0–200)
HDL: 35.6 mg/dL — ABNORMAL LOW (ref >39.00)
LDL Cholesterol: 18 mg/dL (ref 0–99)
NONHDL: 82.4
Triglycerides: 324 mg/dL — ABNORMAL HIGH (ref 0.0–149.0)
VLDL: 64.8 mg/dL — ABNORMAL HIGH (ref 0.0–40.0)

## 2014-04-11 LAB — HEMOGLOBIN A1C: Hgb A1c MFr Bld: 10 % — ABNORMAL HIGH (ref 4.6–6.5)

## 2014-04-12 ENCOUNTER — Other Ambulatory Visit: Payer: Self-pay | Admitting: Internal Medicine

## 2014-04-12 DIAGNOSIS — E1149 Type 2 diabetes mellitus with other diabetic neurological complication: Secondary | ICD-10-CM

## 2014-04-12 DIAGNOSIS — E1165 Type 2 diabetes mellitus with hyperglycemia: Principal | ICD-10-CM

## 2014-04-12 DIAGNOSIS — E1129 Type 2 diabetes mellitus with other diabetic kidney complication: Secondary | ICD-10-CM

## 2014-04-14 NOTE — Telephone Encounter (Signed)
Close encounter 

## 2014-04-24 ENCOUNTER — Other Ambulatory Visit: Payer: Self-pay | Admitting: Internal Medicine

## 2014-05-10 ENCOUNTER — Encounter (INDEPENDENT_AMBULATORY_CARE_PROVIDER_SITE_OTHER): Payer: BC Managed Care – PPO | Admitting: Ophthalmology

## 2014-05-10 DIAGNOSIS — E11319 Type 2 diabetes mellitus with unspecified diabetic retinopathy without macular edema: Secondary | ICD-10-CM

## 2014-05-10 DIAGNOSIS — E1165 Type 2 diabetes mellitus with hyperglycemia: Secondary | ICD-10-CM

## 2014-05-10 DIAGNOSIS — E1139 Type 2 diabetes mellitus with other diabetic ophthalmic complication: Secondary | ICD-10-CM

## 2014-05-10 DIAGNOSIS — E11359 Type 2 diabetes mellitus with proliferative diabetic retinopathy without macular edema: Secondary | ICD-10-CM

## 2014-05-10 DIAGNOSIS — H43819 Vitreous degeneration, unspecified eye: Secondary | ICD-10-CM

## 2014-05-10 DIAGNOSIS — H4310 Vitreous hemorrhage, unspecified eye: Secondary | ICD-10-CM

## 2014-05-24 ENCOUNTER — Other Ambulatory Visit (INDEPENDENT_AMBULATORY_CARE_PROVIDER_SITE_OTHER): Payer: BC Managed Care – PPO | Admitting: Ophthalmology

## 2014-05-24 DIAGNOSIS — E1165 Type 2 diabetes mellitus with hyperglycemia: Secondary | ICD-10-CM

## 2014-05-24 DIAGNOSIS — E1139 Type 2 diabetes mellitus with other diabetic ophthalmic complication: Secondary | ICD-10-CM

## 2014-05-24 DIAGNOSIS — E11359 Type 2 diabetes mellitus with proliferative diabetic retinopathy without macular edema: Secondary | ICD-10-CM

## 2014-06-02 ENCOUNTER — Other Ambulatory Visit (INDEPENDENT_AMBULATORY_CARE_PROVIDER_SITE_OTHER): Payer: BC Managed Care – PPO | Admitting: Ophthalmology

## 2014-06-02 DIAGNOSIS — E1139 Type 2 diabetes mellitus with other diabetic ophthalmic complication: Secondary | ICD-10-CM

## 2014-06-02 DIAGNOSIS — H3581 Retinal edema: Secondary | ICD-10-CM

## 2014-06-02 DIAGNOSIS — E1165 Type 2 diabetes mellitus with hyperglycemia: Secondary | ICD-10-CM

## 2014-06-13 ENCOUNTER — Other Ambulatory Visit: Payer: Self-pay | Admitting: Internal Medicine

## 2014-06-14 ENCOUNTER — Ambulatory Visit (INDEPENDENT_AMBULATORY_CARE_PROVIDER_SITE_OTHER): Payer: BC Managed Care – PPO

## 2014-06-14 DIAGNOSIS — Z23 Encounter for immunization: Secondary | ICD-10-CM

## 2014-06-15 ENCOUNTER — Ambulatory Visit: Payer: BC Managed Care – PPO

## 2014-07-03 ENCOUNTER — Other Ambulatory Visit (INDEPENDENT_AMBULATORY_CARE_PROVIDER_SITE_OTHER): Payer: BC Managed Care – PPO | Admitting: Ophthalmology

## 2014-07-03 DIAGNOSIS — E11359 Type 2 diabetes mellitus with proliferative diabetic retinopathy without macular edema: Secondary | ICD-10-CM

## 2014-07-03 DIAGNOSIS — E1139 Type 2 diabetes mellitus with other diabetic ophthalmic complication: Secondary | ICD-10-CM

## 2014-07-03 DIAGNOSIS — E1165 Type 2 diabetes mellitus with hyperglycemia: Secondary | ICD-10-CM

## 2014-07-12 ENCOUNTER — Other Ambulatory Visit: Payer: Self-pay | Admitting: Internal Medicine

## 2014-07-27 ENCOUNTER — Other Ambulatory Visit: Payer: Self-pay | Admitting: Internal Medicine

## 2014-09-18 LAB — HM DIABETES EYE EXAM

## 2014-10-12 ENCOUNTER — Telehealth: Payer: Self-pay | Admitting: Internal Medicine

## 2014-10-12 ENCOUNTER — Other Ambulatory Visit: Payer: Self-pay | Admitting: Internal Medicine

## 2014-10-12 NOTE — Telephone Encounter (Signed)
Pt states she is currently unemployed, pt sees multiple MD's for various things (endocrinologist, kidney MD) she cannot afford MD visits. She is asking if there is any way the 2 prior requested meds can be refilled.  [phone]

## 2014-10-13 ENCOUNTER — Other Ambulatory Visit: Payer: Self-pay | Admitting: Internal Medicine

## 2014-10-13 MED ORDER — FUROSEMIDE 80 MG PO TABS: 80.0000 mg | ORAL_TABLET | Freq: Every day | ORAL | Status: DC

## 2014-10-13 NOTE — Telephone Encounter (Signed)
Agree - find out which meds are requested -- Most likely, yes, I can manage refills - will determine after review of the rx requested

## 2014-10-13 NOTE — Telephone Encounter (Signed)
The way the message read was that the pt wanted PCP to okay refill for rx from other MD's. Spoke to pt and that was not the case. She wanted the Lasix and Doxycycline filled (which was filled by PCP last).   Pt has a question for PCP: her glucose test strips wo insurance is over $100 and wants to know if there are any resources that could assist pt with cost of strips.

## 2014-10-16 NOTE — Telephone Encounter (Signed)
I am not aware of pt asst with DM strips - may need to change to different glucometer machine - She can check with [facility] on cost of their brand of strips (for their brand of glucometer) - it may be significantly less!

## 2014-10-16 NOTE — Telephone Encounter (Signed)
LVM for pt to call back.

## 2014-10-17 NOTE — Telephone Encounter (Signed)
Pt left message on VM.   Called pt back and LVM

## 2014-10-17 NOTE — Telephone Encounter (Signed)
Pt stated that she found a cheaper glucometer and strips called "relion" (sp?) at [facility].   Pt will let us know when/if she needs refills.

## 2014-11-03 ENCOUNTER — Ambulatory Visit (INDEPENDENT_AMBULATORY_CARE_PROVIDER_SITE_OTHER): Payer: BC Managed Care – PPO | Admitting: Ophthalmology

## 2014-11-06 ENCOUNTER — Other Ambulatory Visit: Payer: Self-pay | Admitting: Internal Medicine

## 2014-12-01 ENCOUNTER — Ambulatory Visit (INDEPENDENT_AMBULATORY_CARE_PROVIDER_SITE_OTHER): Payer: BLUE CROSS/BLUE SHIELD | Admitting: Ophthalmology

## 2014-12-01 DIAGNOSIS — E11359 Type 2 diabetes mellitus with proliferative diabetic retinopathy without macular edema: Secondary | ICD-10-CM | POA: Diagnosis not present

## 2014-12-01 DIAGNOSIS — E11319 Type 2 diabetes mellitus with unspecified diabetic retinopathy without macular edema: Secondary | ICD-10-CM

## 2014-12-01 DIAGNOSIS — H43813 Vitreous degeneration, bilateral: Secondary | ICD-10-CM

## 2015-01-01 ENCOUNTER — Other Ambulatory Visit: Payer: Self-pay | Admitting: Internal Medicine

## 2015-02-01 ENCOUNTER — Ambulatory Visit (INDEPENDENT_AMBULATORY_CARE_PROVIDER_SITE_OTHER): Payer: BLUE CROSS/BLUE SHIELD | Admitting: Internal Medicine

## 2015-02-01 ENCOUNTER — Other Ambulatory Visit (INDEPENDENT_AMBULATORY_CARE_PROVIDER_SITE_OTHER): Payer: BLUE CROSS/BLUE SHIELD

## 2015-02-01 ENCOUNTER — Ambulatory Visit (INDEPENDENT_AMBULATORY_CARE_PROVIDER_SITE_OTHER)
Admission: RE | Admit: 2015-02-01 | Discharge: 2015-02-01 | Disposition: A | Discharge status: Home / Self Care | Payer: BLUE CROSS/BLUE SHIELD | Source: Ambulatory Visit | Attending: Internal Medicine | Admitting: Internal Medicine

## 2015-02-01 ENCOUNTER — Encounter: Payer: Self-pay | Admitting: Internal Medicine

## 2015-02-01 VITALS — BP 126/84 | HR 91 | Temp 98.4°F | Resp 16 | Ht 64.0 in | Wt 298.2 lb

## 2015-02-01 DIAGNOSIS — M25475 Effusion, left foot: Secondary | ICD-10-CM

## 2015-02-01 DIAGNOSIS — M79675 Pain in left toe(s): Secondary | ICD-10-CM

## 2015-02-01 LAB — URIC ACID: URIC ACID, SERUM: 10.7 mg/dL — AB (ref 2.4–7.0)

## 2015-02-01 MED ORDER — PREDNISONE 10 MG PO TABS: ORAL_TABLET | ORAL | Status: DC

## 2015-02-01 NOTE — Patient Instructions (Signed)
  Your next office appointment will be determined based upon review of your pending labs  and  xrays  Those instructions will be transmitted to you by mail for your records.  Critical results will be called.   Followup as needed for any active or acute issue. Please report any significant change in your symptoms.

## 2015-02-01 NOTE — Progress Notes (Signed)
   Subjective:    Patient ID: Alice Kemp, female    DOB: 1951/08/29, 64 y.o.   MRN: [id number]  HPI She's had swelling for one week of the left foot. There is tightness and cramping up to level I. There is no history of any trigger or injury for this.  She has renal failure and is on furosemide 80 mg daily from Dr. Posey Pronto. She states her most recent creatinine was "okay". Last on record was 2.2 in July 2015.  She's not monitoring glucoses. Her last A1c was 8% in February this year by Dr. Chalmers Cater. She has no history of gout. She is not on thiazide. .      Review of Systems Denied are chest pain, palpitations, claudication, paroxysmal nocturnal dyspnea, exertional dyspnea, significant cough, or hemoptysis.  She denies fever, chills, sweats.  There is no numbness or tingling in that extremity.  She's had no abnormal bruising or bleeding.    Objective:   Physical Exam Pertinent or positive findings include: An S4 gallop is present.  Abdomen is massive.  She has dullness in the upper quadrants. The left foot is swollen, 2+. There is very faint erythema over the foot. There is slight tenderness to palpation of the left metatarsal joint.  Posterior tibial pulses are decreased  General appearance :BMI: 51.17;adequately nourished; in no distress. Eyes: No conjunctival inflammation or scleral icterus is present. Heart:  Normal rate and regular rhythm. S1 and S2 normal without murmur, click, or rub . Lungs:Chest clear to auscultation; no wheezes, rhonchi,rales ,or rubs present.No increased work of breathing.  Abdomen: bowel sounds normal, soft and non-tender without masses, organomegaly or hernias noted.  No guarding or rebound.  Vascular : all pulses equal ; no bruits present. Skin:Warm & dry.  Intact without suspicious lesions or rashes ; no tenting or jaundice  Lymphatic: No lymphadenopathy is noted about the head, neck, axilla Neuro: Strength, tone normal.       Assessment &  Pla#1 #1 left foot swelling with tenderness of the metatarsal joint suggesting acute gout  #2 diabetes, adequate control  Plan: Short course of low-dose prednisone may be the safest course in view of the renal insufficiency

## 2015-02-01 NOTE — Progress Notes (Signed)
Pre visit review using our clinic review tool, if applicable. No additional management support is needed unless otherwise documented below in the visit note. 

## 2015-04-10 ENCOUNTER — Ambulatory Visit (INDEPENDENT_AMBULATORY_CARE_PROVIDER_SITE_OTHER): Payer: BLUE CROSS/BLUE SHIELD | Admitting: Internal Medicine

## 2015-04-10 ENCOUNTER — Other Ambulatory Visit (INDEPENDENT_AMBULATORY_CARE_PROVIDER_SITE_OTHER): Payer: BLUE CROSS/BLUE SHIELD

## 2015-04-10 ENCOUNTER — Encounter (INDEPENDENT_AMBULATORY_CARE_PROVIDER_SITE_OTHER): Payer: Self-pay

## 2015-04-10 ENCOUNTER — Other Ambulatory Visit: Payer: Self-pay | Admitting: Internal Medicine

## 2015-04-10 ENCOUNTER — Encounter: Payer: Self-pay | Admitting: Internal Medicine

## 2015-04-10 VITALS — BP 118/68 | HR 87 | Temp 98.5°F | Ht 64.0 in | Wt 297.5 lb

## 2015-04-10 DIAGNOSIS — IMO0002 Reserved for concepts with insufficient information to code with codable children: Secondary | ICD-10-CM

## 2015-04-10 DIAGNOSIS — Z1239 Encounter for other screening for malignant neoplasm of breast: Secondary | ICD-10-CM

## 2015-04-10 DIAGNOSIS — N183 Chronic kidney disease, stage 3 unspecified: Secondary | ICD-10-CM

## 2015-04-10 DIAGNOSIS — E1122 Type 2 diabetes mellitus with diabetic chronic kidney disease: Secondary | ICD-10-CM

## 2015-04-10 DIAGNOSIS — E1165 Type 2 diabetes mellitus with hyperglycemia: Secondary | ICD-10-CM

## 2015-04-10 DIAGNOSIS — E039 Hypothyroidism, unspecified: Secondary | ICD-10-CM

## 2015-04-10 DIAGNOSIS — Z23 Encounter for immunization: Secondary | ICD-10-CM | POA: Diagnosis not present

## 2015-04-10 DIAGNOSIS — E083399 Diabetes mellitus due to underlying condition with moderate nonproliferative diabetic retinopathy without macular edema, unspecified eye: Secondary | ICD-10-CM

## 2015-04-10 DIAGNOSIS — E08339 Diabetes mellitus due to underlying condition with moderate nonproliferative diabetic retinopathy without macular edema: Secondary | ICD-10-CM

## 2015-04-10 LAB — BASIC METABOLIC PANEL
BUN: 23 mg/dL (ref 6–23)
CO2: 28 mEq/L (ref 19–32)
Calcium: 9.3 mg/dL (ref 8.4–10.5)
Chloride: 102 mEq/L (ref 96–112)
Creatinine, Ser: 2.22 mg/dL — ABNORMAL HIGH (ref 0.40–1.20)
GFR: 23.58 mL/min — AB (ref >60.00)
GLUCOSE: 101 mg/dL — AB (ref 70–99)
POTASSIUM: 4.3 meq/L (ref 3.5–5.1)
Sodium: 140 mEq/L (ref 135–145)

## 2015-04-10 LAB — LIPID PANEL
Cholesterol: 169 mg/dL (ref 0–200)
HDL: 38.3 mg/dL — ABNORMAL LOW (ref >39.00)
LDL Cholesterol: 92 mg/dL (ref 0–99)
NonHDL: 130.7
Total CHOL/HDL Ratio: 4
Triglycerides: 194 mg/dL — ABNORMAL HIGH (ref 0.0–149.0)
VLDL: 38.8 mg/dL (ref 0.0–40.0)

## 2015-04-10 LAB — HEMOGLOBIN A1C: HEMOGLOBIN A1C: 8.1 % — AB (ref 4.6–6.5)

## 2015-04-10 LAB — TSH: TSH: 1.62 u[IU]/mL (ref 0.35–4.50)

## 2015-04-10 MED ORDER — LOSARTAN POTASSIUM 25 MG PO TABS: 25.0000 mg | ORAL_TABLET | Freq: Every day | ORAL | Status: DC

## 2015-04-10 MED ORDER — NOVOLOG MIX 70/30 FLEXPEN (70-30) 100 UNIT/ML ~~LOC~~ SUPN: PEN_INJECTOR | SUBCUTANEOUS | Status: DC

## 2015-04-10 NOTE — Assessment & Plan Note (Signed)
New dx 01/2011 - follows with endo for same The current medical regimen is effective;  continue present plan and medications. Lab Results  Component Value Date   TSH 1.65 09/23/2013

## 2015-04-10 NOTE — Progress Notes (Signed)
Pre visit review using our clinic review tool, if applicable. No additional management support is needed unless otherwise documented below in the visit note. 

## 2015-04-10 NOTE — Patient Instructions (Signed)
It was good to see you today.  We have reviewed your prior records including labs and tests today  Test(s) ordered today. Your results will be released to [facility] (or called to you) after review, usually within 72hours after test completion. If any changes need to be made, you will be notified at that same time.  Medications reviewed and updated, no changes recommended at this time.  we'll make referral to mammogram. Our office will contact you regarding appointment(s) once made.  Continue working with your other specialists as reviewed today - will help schedule followup with Dr Zigmund Daniel for your eyes  Please schedule followup in 6 months, call sooner if problems.

## 2015-04-10 NOTE — Addendum Note (Signed)
Addended by: Lowella Dandy on: 04/10/2015 02:29 PM   Modules accepted: Orders

## 2015-04-10 NOTE — Assessment & Plan Note (Signed)
Increase Cr 06/2013 noted following initiation of low dose ACEI 05/2013 follow up creatinine overall unchanged despite no ACEI, no metformin, no NSAIDs and no furosemide > 1 month Resumed lasix 08/2013 for chf pending renal eval  stabilized Cr - follows with renal Posey Pronto) q48mo

## 2015-04-10 NOTE — Progress Notes (Signed)
Subjective:    Patient ID: Alice Kemp, female    DOB: [birth year], 64 y.o.   MRN: [id number]  HPI  Patient here for follow-up. Reviewed chronic conditions and interval events since last visit  Past Medical History  Diagnosis Date  . Hyperlipidemia   . Diabetes mellitus type II   . Pulmonary hypertension   . Unspecified hypothyroidism   . Breast abscess 07/2011    left; s/p I&D + abx by gen surg  . Arthritis   . Depression   . Skin lesion of breast 07/09/2012  . PERICARDIAL EFFUSION 12/12/2010  . OBSTRUCTIVE SLEEP APNEA 12/23/2010    npsg 2012:  AHI 25/hr, severe desat to 68%.   autoset 2013: optimal pressure 13cm   . Morbid obesity   . Cervical radiculopathy at C6   . Benign neoplasm of colon 06/29/2012  . Anxiety   . Vitamin B12 deficiency   . Diastolic heart failure 40/9811 echo  . CKD (chronic kidney disease) stage 3, GFR 30-59 ml/min     follows with renal    Review of Systems  Constitutional: Positive for fatigue. Negative for fever and unexpected weight change.  Respiratory: Negative for cough and shortness of breath.   Cardiovascular: Negative for chest pain, palpitations and leg swelling.  Gastrointestinal: Negative for constipation and blood in stool.  Musculoskeletal: Negative for joint swelling and gait problem.       Objective:    Physical Exam  Constitutional: She is oriented to person, place, and time. She appears well-developed and well-nourished. No distress.  Morbid obesity  Cardiovascular: Normal rate, regular rhythm and normal heart sounds.   No murmur heard. Pulmonary/Chest: Effort normal and breath sounds normal. No respiratory distress.  Musculoskeletal: She exhibits no edema.  Neurological: She is alert and oriented to person, place, and time. No cranial nerve deficit. Coordination normal.    BP 118/68 mmHg  Pulse 87  Temp(Src) 98.5 F (36.9 C) (Oral)  Ht 5\' 4"  (1.626 m)  Wt 297 lb 8 oz (134.945 kg)  BMI 51.04 kg/m2  SpO2 95% Wt  Readings from Last 3 Encounters:  04/10/15 297 lb 8 oz (134.945 kg)  02/01/15 298 lb 4 oz (135.285 kg)  10/21/13 309 lb (140.161 kg)     Lab Results  Component Value Date   WBC 8.9 09/23/2013   HGB 12.7 09/23/2013   HCT 38.5 09/23/2013   PLT 279.0 09/23/2013   GLUCOSE 448* 04/11/2014   CHOL 118 04/11/2014   TRIG 324.0* 04/11/2014   HDL 35.60* 04/11/2014   LDLDIRECT 54.6 05/10/2012   LDLCALC 18 04/11/2014   ALT 8 01/26/2013   AST 13 01/26/2013   NA 133* 04/11/2014   K 4.4 04/11/2014   CL 97 04/11/2014   CREATININE 2.2* 04/11/2014   BUN 30* 04/11/2014   CO2 26 04/11/2014   TSH 1.65 09/23/2013   INR 0.98 01/28/2011   HGBA1C 10.0* 04/11/2014    Dg Foot Complete Left  02/01/2015   CLINICAL DATA:  Left foot swelling for 1 week.  Great toe pain.  EXAM: LEFT FOOT - COMPLETE 3+ VIEW  COMPARISON:  None.  FINDINGS: There is bunion formation on the head of the first metatarsal with slight hallux valgus deformity and slight arthritis at the joint. There is overlying soft tissue swelling. There is slight arthritic changes of the DIP joint of the third toe. There are prominent arthritic changes at the second tarsal metatarsal joint with prominent dorsal soft tissue swelling on the foot. Claggett  plantar calcaneal spur. Dorsal spurring at the first second and third tarsometatarsal joints.  IMPRESSION: Arthritic changes at the tarsometatarsal joints and at the first metatarsal phalangeal joint. The findings are typical for osteoarthritis. No radiographic findings suggestive of gout other than the soft tissue swelling.   Electronically Signed   By: Lorriane Shire M.D.   On: 02/01/2015 16:57       Assessment & Plan:   Problem List Items Addressed This Visit    CKD (chronic kidney disease), stage III    Increase Cr 06/2013 noted following initiation of low dose ACEI 05/2013 follow up creatinine overall unchanged despite no ACEI, no metformin, no NSAIDs and no furosemide > 1 month Resumed lasix  08/2013 for chf pending renal eval  stabilized Cr - follows with renal Posey Pronto) q30mo      Hypothyroidism    New dx 01/2011 - follows with endo for same The current medical regimen is effective;  continue present plan and medications. Lab Results  Component Value Date   TSH 1.65 09/23/2013        Relevant Orders   TSH   Morbid obesity    Reviewed as primary contributing risk to health issues and symptoms Discussed options for weight loss including bariatric intervention - Also discussed possible medical prescriptions for weight loss -  The patient is asked to make an attempt to improve diet and exercise patterns to aid in medical management of this problem.  Wt Readings from Last 3 Encounters:  04/10/15 297 lb 8 oz (134.945 kg)  02/01/15 298 lb 4 oz (135.285 kg)  10/21/13 309 lb (140.161 kg)        Relevant Medications   NOVOLOG MIX 70/30 FLEXPEN (70-30) 100 UNIT/ML FlexPen   Uncontrolled type 2 diabetes mellitus with stage 3 chronic kidney disease - Primary    Lab Results  Component Value Date   HGBA1C 10.0* 04/11/2014   No longer on metformin or glipizide since 07/11/13 dx of renal dysfunction Briefly back on metformin when cleared by renal, but not approved to conitnue same per endo titration 70/30 TID as ongoing by endo      Relevant Medications   NOVOLOG MIX 70/30 FLEXPEN (70-30) 100 UNIT/ML FlexPen   simvastatin (ZOCOR) 40 MG tablet   losartan (COZAAR) 25 MG tablet   Other Relevant Orders   Hemoglobin V7S   Basic metabolic panel   Lipid panel    Other Visit Diagnoses    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

## 2015-04-10 NOTE — Assessment & Plan Note (Signed)
Reviewed as primary contributing risk to health issues and symptoms Discussed options for weight loss including bariatric intervention - Also discussed possible medical prescriptions for weight loss -  The patient is asked to make an attempt to improve diet and exercise patterns to aid in medical management of this problem.  Wt Readings from Last 3 Encounters:  04/10/15 297 lb 8 oz (134.945 kg)  02/01/15 298 lb 4 oz (135.285 kg)  10/21/13 309 lb (140.161 kg)

## 2015-04-10 NOTE — Assessment & Plan Note (Signed)
Lab Results  Component Value Date   HGBA1C 10.0* 04/11/2014   No longer on metformin or glipizide since 07/11/13 dx of renal dysfunction Briefly back on metformin when cleared by renal, but not approved to conitnue same per endo titration 70/30 TID as ongoing by endo

## 2015-05-21 ENCOUNTER — Telehealth: Payer: Self-pay | Admitting: *Deleted

## 2015-05-21 ENCOUNTER — Telehealth: Payer: Self-pay | Admitting: Internal Medicine

## 2015-05-21 NOTE — Telephone Encounter (Signed)
Spoke with pt, stated that because there was not a dx for Gout and that she had not been prescribed any medication for gout, she needed to be seen. If she was unable to get to the office or to the ED, she was instructed to call EMS if needed. Pt was instructed to minimize drinking so many soft drinks.

## 2015-05-21 NOTE — Telephone Encounter (Signed)
Patient Name: Alice Kemp DOB: 05-16-1951 Initial Comment Caller states she is having a severe gout flare up. Cannot walk. Nurse Assessment Nurse: Ronnald Ramp, RN, Miranda Date/Time (Eastern Time): 05/21/2015 9:20:59 AM Confirm and document reason for call. If symptomatic, describe symptoms. ---Caller states she is having severe pain in her right foot since yesterday. She believes it is a gout flair up. Has the patient traveled out of the country within the last 30 days? ---Not Applicable Does the patient require triage? ---Yes Related visit to physician within the last 2 weeks? ---No Does the PT have any chronic conditions? (i.e. diabetes, asthma, etc.) ---Yes List chronic conditions. ---Diabetes Guidelines Guideline Title Affirmed Question Affirmed Notes Diabetes - Foot Problems and Questions Foot or toe pain Final Disposition User See Physician within 24 Hours Jones, RN, Miranda Comments Triaged to be seen within 24 hours. Pt states she cannot walk to get into the office or the ED. She wants something called in over the phone without being seen. Told caller I could put a note on the chart and request the provider review and call her, but could not guarantee any time frame of when that will be. Referrals REFERRED TO PCP OFFICE GO TO FACILITY REFUSED REFERRED TO PCP OFFICE Disagree/Comply: Disagree Disagree/Comply Reason: Disagree with instructions

## 2015-05-21 NOTE — Telephone Encounter (Signed)
Please review and advise.   8/10 pain scale Swelling in the toes and lateral ball of right foot.  Not able to come into office or go to ED.  Request meds to be sent to pharmacy.

## 2015-05-21 NOTE — Telephone Encounter (Signed)
[facility] Patient Name: Alice Kemp Gender: Female DOB: 1951-02-16 Age: 65 Y 10 M 6 D Return Phone Number: [phone] (Primary) Address: City/State/Zip: [place] Client [place] [facility] - Client Client Site [place] - Day Physician Gwendolyn Grant Contact Type Call Call Type Triage / Clinical Relationship To Patient Self Appointment Disposition EMR Patient Refused Appointment Info pasted into Epic Yes Return Phone Number [phone] (Primary) Chief Complaint Walking difficulty Initial Comment Caller states she is having a severe gout flare up. Cannot walk. PreDisposition Call Doctor Nurse Assessment Nurse: Ronnald Ramp, RN, Miranda Date/Time Eilene Ghazi Time): 05/21/2015 9:20:59 AM Confirm and document reason for call. If symptomatic, describe symptoms. ---Caller states she is having severe pain in her right foot since yesterday. She believes it is a gout flair up. Has the patient traveled out of the country within the last 30 days? ---Not Applicable Does the patient require triage? ---Yes Related visit to physician within the last 2 weeks? ---No Does the PT have any chronic conditions? (i.e. diabetes, asthma, etc.) ---Yes List chronic conditions. ---Diabetes Guidelines Guideline Title Affirmed Question Affirmed Notes Nurse Date/Time Eilene Ghazi Time) Diabetes - Foot Problems and Questions Foot or toe pain Ronnald Ramp, RN, Miranda 05/21/2015 9:22:07 AM Disp. Time Eilene Ghazi Time) Disposition Final User 05/21/2015 9:29:04 AM See Physician within 24 Hours Yes Ronnald Ramp, RN, Judge Stall Understands: Yes Disagree/Comply: Disagree Disagree/Comply Reason: Disagree with instructions Care Advice Given Per Guideline PLEASE NOTE: All timestamps contained within this report are represented as [place] Standard Time. CONFIDENTIALTY NOTICE: This fax transmission is intended only for the  addressee. It contains information that is legally privileged, confidential or otherwise protected from use or disclosure. If you are not the intended recipient, you are strictly prohibited from reviewing, disclosing, copying using or disseminating any of this information or taking any action in reliance on or regarding this information. If you have received this fax in error, please notify us immediately by telephone so that we can arrange for its return to [name]. Phone: [phone], Toll-Free: [phone], Fax: [phone] Page: 2 of 2 Call Id: [id number] Care Advice Given Per Guideline SEE PHYSICIAN WITHIN 24 HOURS: ACETAMINOPHEN (E.G., TYLENOL): CALL BACK IF: * An ulcer, redness, or drainage appears * Fever occurs * You become worse CARE ADVICE given per Diabetes - Foot Problems and Questions (Adult) guideline. * Take 650 mg (two 325 mg pills) by mouth every 4-6 hours as needed. Each Regular Strength Tylenol pill has 325 mg of acetaminophen. The most you should take each day is 3,250 mg (10 Regular Strength pills a day). * Another choice is to take 1,000 mg (two 500 mg pills) every 8 hours as needed. Each Extra Strength Tylenol pill has 500 mg of acetaminophen. The most you should take each day is 3,000 mg (6 Extra Strength pills a day). After Care Instructions Given Call Event Type User Date / Time Description Comments User: Leverne Humbles, RN Date/Time Eilene Ghazi Time): 05/21/2015 9:31:51 AM Triaged to be seen within 24 hours. Pt states she cannot walk to get into the office or the ED. She wants something called in over the phone without being seen. Told caller I could put a note on the chart and request the provider review and call her, but could not guarantee any time frame of when that will be. Referrals REFERRED TO PCP OFFICE GO TO FACILITY REFUSED REFERRED TO PCP OFFICE

## 2015-05-28 ENCOUNTER — Other Ambulatory Visit: Payer: Self-pay | Admitting: Internal Medicine

## 2015-06-01 ENCOUNTER — Ambulatory Visit (INDEPENDENT_AMBULATORY_CARE_PROVIDER_SITE_OTHER): Payer: BLUE CROSS/BLUE SHIELD | Admitting: Ophthalmology

## 2015-06-05 ENCOUNTER — Encounter: Payer: Self-pay | Admitting: Family Medicine

## 2015-06-05 ENCOUNTER — Ambulatory Visit (INDEPENDENT_AMBULATORY_CARE_PROVIDER_SITE_OTHER): Payer: BLUE CROSS/BLUE SHIELD | Admitting: Family Medicine

## 2015-06-05 ENCOUNTER — Ambulatory Visit (HOSPITAL_BASED_OUTPATIENT_CLINIC_OR_DEPARTMENT_OTHER)
Admission: RE | Admit: 2015-06-05 | Discharge: 2015-06-05 | Disposition: A | Discharge status: Home / Self Care | Payer: BLUE CROSS/BLUE SHIELD | Source: Ambulatory Visit | Attending: Family Medicine | Admitting: Family Medicine

## 2015-06-05 VITALS — BP 131/81 | HR 97 | Ht 64.0 in | Wt 295.0 lb

## 2015-06-05 DIAGNOSIS — S8992XA Unspecified injury of left lower leg, initial encounter: Secondary | ICD-10-CM

## 2015-06-05 DIAGNOSIS — M79662 Pain in left lower leg: Secondary | ICD-10-CM | POA: Diagnosis not present

## 2015-06-05 DIAGNOSIS — I70202 Unspecified atherosclerosis of native arteries of extremities, left leg: Secondary | ICD-10-CM | POA: Insufficient documentation

## 2015-06-05 MED ORDER — OXYCODONE-ACETAMINOPHEN 5-325 MG PO TABS: 1.0000 | ORAL_TABLET | Freq: Four times a day (QID) | ORAL | Status: DC | PRN

## 2015-06-05 NOTE — Patient Instructions (Addendum)
You sustained a severe bone contusion of your tibia. Expect this to take 4-6 weeks to improve on average. Icing (or heat, whichever feels better) 15 minutes at a time 3-4 times a day. Consider aspercreme, biofreeze, capsaicin topically up to 4 times a day. Oxycodone as needed for severe pain (no driving on this). Cane, walker, or knee scooter if needed for support. Unfortunately there is not much you can do to accelerate the healing process of these. Follow up with me in 4-6 weeks or as needed.

## 2015-06-06 DIAGNOSIS — S8992XA Unspecified injury of left lower leg, initial encounter: Secondary | ICD-10-CM | POA: Insufficient documentation

## 2015-06-06 NOTE — Assessment & Plan Note (Signed)
radiographs negative for fracture.  2/2 bone contusion.  Icing, oxycodone with topical medications as needed.  Consider cane or walker.  Expect 4-6 weeks for complete recovery.  Follow up with me at that time.

## 2015-06-06 NOTE — Progress Notes (Signed)
PCP: Gwendolyn Grant, MD  Subjective:   HPI: Alice Kemp is a 64 y.o. female here for left leg injury.  Alice Kemp reports she went into an [employer] store on 8/25 and tripped on a rug, fell forward directly onto left knee mainly. Left shoulder also a little sore but is tolerable. Pain level 5/10 in left knee still. Swelling, some bruising. Difficulty walking due to pain. Has been icing, alternating with heat. Not taking any medicines or using anything for assistance with walking.  Past Medical History  Diagnosis Date  . Hyperlipidemia   . Diabetes mellitus type II   . Pulmonary hypertension   . Unspecified hypothyroidism   . Breast abscess 07/2011    left; s/p I&D + abx by gen surg  . Arthritis   . Depression   . Skin lesion of breast 07/09/2012  . PERICARDIAL EFFUSION 12/12/2010  . OBSTRUCTIVE SLEEP APNEA 12/23/2010    npsg 2012:  AHI 25/hr, severe desat to 68%.   autoset 2013: optimal pressure 13cm   . Morbid obesity   . Cervical radiculopathy at C6   . Benign neoplasm of colon 06/29/2012  . Anxiety   . Vitamin B12 deficiency   . Diastolic heart failure 68/6168 echo  . CKD (chronic kidney disease) stage 3, GFR 30-59 ml/min     follows with renal    Current Outpatient Prescriptions on File Prior to Visit  Medication Sig Dispense Refill  . amitriptyline (ELAVIL) 10 MG tablet   5  . aspirin 81 MG tablet Take 81 mg by mouth daily.      Marland Kitchen buPROPion (WELLBUTRIN XL) 300 MG 24 hr tablet Take 1 tablet (300 mg total) by mouth daily. 90 tablet 3  . celecoxib (CELEBREX) 100 MG capsule Take 100 mg by mouth as needed.    . fluticasone (CUTIVATE) 0.05 % cream APPLY 3 TIMES A DAY AS NEEDED FOR ITCHING 60 g 1  . furosemide (LASIX) 80 MG tablet Take 1 tablet (80 mg total) by mouth daily. 90 tablet 3  . levocetirizine (XYZAL) 5 MG tablet Take 1 tablet (5 mg total) by mouth at bedtime. 90 tablet 1  . levothyroxine (SYNTHROID, LEVOTHROID) 75 MCG tablet Take 75 mcg by mouth daily before  breakfast.    . losartan (COZAAR) 25 MG tablet Take 1 tablet (25 mg total) by mouth daily. 90 tablet 3  . NOVOLOG MIX 70/30 FLEXPEN (70-30) 100 UNIT/ML FlexPen 75 units with Breakfast, 75 units with Lunch, 55 units with dinner 225 mL 3  . simvastatin (ZOCOR) 40 MG tablet Take 1 tablet (40 mg total) by mouth daily at 6 PM. 30 tablet 3  . Vitamin D, Ergocalciferol, (DRISDOL) 50000 UNITS CAPS capsule   1   No current facility-administered medications on file prior to visit.    Past Surgical History  Procedure Laterality Date  . Shoulder surgery  2010    Left  . Cataract extraction    . Tonsillectomy  1964  . Breast biopsy  1990    Left  . Colonoscopy  06/29/2012    Procedure: COLONOSCOPY;  Surgeon: Ladene Artist, MD,FACG;  Location: [facility];  Service: Endoscopy;  Laterality: N/A;    Allergies  Allergen Reactions  . Penicillins Rash    Social History   Social History  . Marital Status: Single    Spouse Name: N/A  . Number of Children: 0  . Years of Education: N/A   Occupational History  . 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 Smoking status: Never Smoker   . Smokeless tobacco: Never Used  . Alcohol Use: No  . Drug Use: No  . Sexual Activity: Not on file   Other Topics Concern  . Not on file   Social History Narrative   Lives alone.          Family History  Problem Relation Age of Onset  . Hypertension Mother   . Heart disease Mother   . Heart attack Mother   . Diabetes Father   . Heart disease Father   . Skin cancer Father   . Cancer Father     skin  . Heart attack Father   . Hyperlipidemia Father   . Colon polyps Father   . Hypertension Brother   . Skin cancer Paternal Grandfather   . Colon cancer Paternal Grandfather 86  . Breast cancer Paternal Aunt   . Cancer Paternal Aunt     breast  . Diabetes Paternal Uncle   . Diabetes Maternal Grandfather   . Sudden death Neg Hx     BP  131/81 mmHg  Pulse 97  Ht 5\' 4"  (1.626 m)  Wt 295 lb (133.811 kg)  BMI 50.61 kg/m2  Review of Systems: See HPI above.    Objective:  Physical Exam:  Gen: NAD  Left knee/lower leg: Mild swelling proximal shin.  No bruising, other deformity.  No effusion. TTP mid-proximal tibia.  No other tenderness of knee or lower leg. FROM knee without pain. Negative ant/post drawers. Negative valgus/varus testing. Negative lachmanns. Negative mcmurrays, apleys, patellar apprehension. NV intact distally.    Assessment & Plan:  1. Left leg injury - radiographs negative for fracture.  2/2 bone contusion.  Icing, oxycodone with topical medications as needed.  Consider cane or walker.  Expect 4-6 weeks for complete recovery.  Follow up with me at that time.

## 2015-06-15 ENCOUNTER — Ambulatory Visit: Payer: BLUE CROSS/BLUE SHIELD

## 2015-06-25 ENCOUNTER — Ambulatory Visit (INDEPENDENT_AMBULATORY_CARE_PROVIDER_SITE_OTHER): Payer: BLUE CROSS/BLUE SHIELD | Admitting: Ophthalmology

## 2015-07-04 ENCOUNTER — Ambulatory Visit (INDEPENDENT_AMBULATORY_CARE_PROVIDER_SITE_OTHER): Payer: BLUE CROSS/BLUE SHIELD | Admitting: Ophthalmology

## 2015-07-04 DIAGNOSIS — H43813 Vitreous degeneration, bilateral: Secondary | ICD-10-CM | POA: Diagnosis not present

## 2015-07-04 DIAGNOSIS — E11351 Type 2 diabetes mellitus with proliferative diabetic retinopathy with macular edema: Secondary | ICD-10-CM | POA: Diagnosis not present

## 2015-07-04 DIAGNOSIS — E11311 Type 2 diabetes mellitus with unspecified diabetic retinopathy with macular edema: Secondary | ICD-10-CM

## 2015-07-10 ENCOUNTER — Ambulatory Visit: Payer: BLUE CROSS/BLUE SHIELD | Admitting: Family Medicine

## 2015-07-12 ENCOUNTER — Ambulatory Visit (INDEPENDENT_AMBULATORY_CARE_PROVIDER_SITE_OTHER): Payer: Medicare Other

## 2015-07-12 DIAGNOSIS — Z23 Encounter for immunization: Secondary | ICD-10-CM

## 2015-07-24 ENCOUNTER — Encounter: Payer: Self-pay | Admitting: Family Medicine

## 2015-07-24 ENCOUNTER — Ambulatory Visit (INDEPENDENT_AMBULATORY_CARE_PROVIDER_SITE_OTHER): Payer: Medicare Other | Admitting: Family Medicine

## 2015-07-24 VITALS — BP 141/73 | HR 94 | Ht 65.0 in | Wt 295.0 lb

## 2015-07-24 DIAGNOSIS — S8992XD Unspecified injury of left lower leg, subsequent encounter: Secondary | ICD-10-CM

## 2015-07-24 DIAGNOSIS — M79675 Pain in left toe(s): Secondary | ICD-10-CM | POA: Diagnosis not present

## 2015-07-24 MED ORDER — DICLOFENAC SODIUM 75 MG PO TBEC: 75.0000 mg | DELAYED_RELEASE_TABLET | Freq: Two times a day (BID) | ORAL | Status: DC

## 2015-07-24 NOTE — Patient Instructions (Signed)
Your exam is still reassuring that this is a contusion, it's just taking longer than expected to recover fully. Take voltaren 75mg  twice a day with food for 1 week then as needed for your toe. Icing 15 minutes at a time 3-4 times a day as needed. Consider aspercreme, biofreeze, capsaicin topically up to 4 times a day. Cane, walker, or knee scooter if needed for support. Follow up with me in 4-6 weeks or as needed. If still not improving we can consider doing an MRI though this is likely to be normal.

## 2015-07-25 DIAGNOSIS — M79675 Pain in left toe(s): Secondary | ICD-10-CM | POA: Insufficient documentation

## 2015-07-25 NOTE — Assessment & Plan Note (Signed)
similar to prior gout flares - discussed options.  Voltaren twice a day, icing.  Consider prednisone, colchicine.

## 2015-07-25 NOTE — Assessment & Plan Note (Signed)
radiographs negative for fracture.  2/2 bone contusion.  Again reassured, slowly improving.  Icing, oxycodone with topical medications as needed.  Consider cane or walker.  Follow up in 4-6 weeks or prn.

## 2015-07-25 NOTE — Progress Notes (Signed)
PCP: Gwendolyn Grant, MD  Subjective:   HPI: Patient is a 64 y.o. female here for left leg injury.  8/30: Patient reports she went into an [employer] store on 8/25 and tripped on a rug, fell forward directly onto left knee mainly. Left shoulder also a little sore but is tolerable. Pain level 5/10 in left knee still. Swelling, some bruising. Difficulty walking due to pain. Has been icing, alternating with heat. Not taking any medicines or using anything for assistance with walking.  10/18: Patient reports pain level down to 4/10 lower left leg. Improving. Does feel like gout is coming back into left great toe (has had previously, felt similar). No new injuries. Mild redness of the toe. No fevers, chills, other complaints.  Past Medical History  Diagnosis Date  . Hyperlipidemia   . Diabetes mellitus type II   . Pulmonary hypertension ([place])   . Unspecified hypothyroidism   . Breast abscess 07/2011    left; s/p I&D + abx by gen surg  . Arthritis   . Depression   . Skin lesion of breast 07/09/2012  . PERICARDIAL EFFUSION 12/12/2010  . OBSTRUCTIVE SLEEP APNEA 12/23/2010    npsg 2012:  AHI 25/hr, severe desat to 68%.   autoset 2013: optimal pressure 13cm   . Morbid obesity ([place])   . Cervical radiculopathy at C6   . Benign neoplasm of colon 06/29/2012  . Anxiety   . Vitamin B12 deficiency   . Diastolic heart failure ([place]) 09/2013 echo  . CKD (chronic kidney disease) stage 3, GFR 30-59 ml/min     follows with renal    Current Outpatient Prescriptions on File Prior to Visit  Medication Sig Dispense Refill  . amitriptyline (ELAVIL) 10 MG tablet   5  . aspirin 81 MG tablet Take 81 mg by mouth daily.      Marland Kitchen buPROPion (WELLBUTRIN XL) 300 MG 24 hr tablet Take 1 tablet (300 mg total) by mouth daily. 90 tablet 3  . celecoxib (CELEBREX) 100 MG capsule Take 100 mg by mouth as needed.    . COLCRYS 0.6 MG tablet   0  . fluticasone (CUTIVATE) 0.05 % cream APPLY 3 TIMES A DAY AS  NEEDED FOR ITCHING 60 g 1  . furosemide (LASIX) 80 MG tablet Take 1 tablet (80 mg total) by mouth daily. 90 tablet 3  . levocetirizine (XYZAL) 5 MG tablet Take 1 tablet (5 mg total) by mouth at bedtime. 90 tablet 1  . levothyroxine (SYNTHROID, LEVOTHROID) 75 MCG tablet Take 75 mcg by mouth daily before breakfast.    . losartan (COZAAR) 25 MG tablet Take 1 tablet (25 mg total) by mouth daily. 90 tablet 3  . metFORMIN (GLUCOPHAGE) 1000 MG tablet   2  . NOVOLOG MIX 70/30 FLEXPEN (70-30) 100 UNIT/ML FlexPen 75 units with Breakfast, 75 units with Lunch, 55 units with dinner 225 mL 3  . ONETOUCH VERIO test strip   3  . oxyCODONE-acetaminophen (PERCOCET/ROXICET) 5-325 MG per tablet Take 1 tablet by mouth every 6 (six) hours as needed for severe pain. 40 tablet 0  . simvastatin (ZOCOR) 40 MG tablet Take 1 tablet (40 mg total) by mouth daily at 6 PM. 30 tablet 3  . Vitamin D, Ergocalciferol, (DRISDOL) 50000 UNITS CAPS capsule   1   No current facility-administered medications on file prior to visit.    Past Surgical History  Procedure Laterality Date  . Shoulder surgery  2010    Left  . Cataract extraction    .  Tonsillectomy  1964  . Breast biopsy  1990    Left  . Colonoscopy  06/29/2012    Procedure: COLONOSCOPY;  Surgeon: Ladene Artist, MD,FACG;  Location: [facility];  Service: Endoscopy;  Laterality: N/A;    Allergies  Allergen Reactions  . Penicillins Rash    Social History   Social History  . Marital Status: Single    Spouse Name: N/A  . Number of Children: 0  . Years of Education: N/A   Occupational History  . 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 Smoking status: Never Smoker   . Smokeless tobacco: Never Used  . Alcohol Use: No  . Drug Use: No  . Sexual Activity: Not on file   Other Topics Concern  . Not on file   Social History Narrative   Lives alone.          Family History  Problem Relation Age  of Onset  . Hypertension Mother   . Heart disease Mother   . Heart attack Mother   . Diabetes Father   . Heart disease Father   . Skin cancer Father   . Cancer Father     skin  . Heart attack Father   . Hyperlipidemia Father   . Colon polyps Father   . Hypertension Brother   . Skin cancer Paternal Grandfather   . Colon cancer Paternal Grandfather 43  . Breast cancer Paternal Aunt   . Cancer Paternal Aunt     breast  . Diabetes Paternal Uncle   . Diabetes Maternal Grandfather   . Sudden death Neg Hx     BP 141/73 mmHg  Pulse 94  Ht 5\' 5"  (1.651 m)  Wt 295 lb (133.811 kg)  BMI 49.09 kg/m2  Review of Systems: See HPI above.    Objective:  Physical Exam:  Gen: NAD  Left knee/lower leg: Minimal swelling proximal shin.  No bruising, other deformity.  No effusion. Minimal TTP mid-proximal tibia.  No other tenderness of knee or lower leg. FROM knee without pain. Negative ant/post drawers. Negative valgus/varus testing. Negative lachmanns. Negative mcmurrays, apleys. NV intact distally.  Right knee: FROM without pain.  Left foot/ankle: No gross deformity, swelling, ecchymoses.  Mild redness dorsally with minimal warmth 1st MTP. FROM - crepitus with pain of 1st MTP on dorsiflexion TTP 1st MTP. Negative ant drawer and talar tilt.   Negative syndesmotic compression. Thompsons test negative. NV intact distally.    Assessment & Plan:  1. Left leg injury - radiographs negative for fracture.  2/2 bone contusion.  Again reassured, slowly improving.  Icing, oxycodone with topical medications as needed.  Consider cane or walker.  Follow up in 4-6 weeks or prn.  2. Left 1st MTP pain - similar to prior gout flares - discussed options.  Voltaren twice a day, icing.  Consider prednisone, colchicine.

## 2015-07-30 ENCOUNTER — Other Ambulatory Visit: Payer: Self-pay | Admitting: Internal Medicine

## 2015-07-30 DIAGNOSIS — H04122 Dry eye syndrome of left lacrimal gland: Secondary | ICD-10-CM | POA: Diagnosis not present

## 2015-08-14 DIAGNOSIS — R809 Proteinuria, unspecified: Secondary | ICD-10-CM | POA: Diagnosis not present

## 2015-08-14 DIAGNOSIS — I10 Essential (primary) hypertension: Secondary | ICD-10-CM | POA: Diagnosis not present

## 2015-08-14 DIAGNOSIS — E039 Hypothyroidism, unspecified: Secondary | ICD-10-CM | POA: Diagnosis not present

## 2015-08-14 DIAGNOSIS — E1165 Type 2 diabetes mellitus with hyperglycemia: Secondary | ICD-10-CM | POA: Diagnosis not present

## 2015-08-14 DIAGNOSIS — E7800 Pure hypercholesterolemia, unspecified: Secondary | ICD-10-CM | POA: Diagnosis not present

## 2015-08-22 DIAGNOSIS — N189 Chronic kidney disease, unspecified: Secondary | ICD-10-CM | POA: Diagnosis not present

## 2015-08-22 DIAGNOSIS — N2581 Secondary hyperparathyroidism of renal origin: Secondary | ICD-10-CM | POA: Diagnosis not present

## 2015-08-22 DIAGNOSIS — N184 Chronic kidney disease, stage 4 (severe): Secondary | ICD-10-CM | POA: Diagnosis not present

## 2015-08-22 DIAGNOSIS — E1140 Type 2 diabetes mellitus with diabetic neuropathy, unspecified: Secondary | ICD-10-CM | POA: Diagnosis not present

## 2015-08-22 DIAGNOSIS — Z79899 Other long term (current) drug therapy: Secondary | ICD-10-CM | POA: Diagnosis not present

## 2015-08-22 DIAGNOSIS — E039 Hypothyroidism, unspecified: Secondary | ICD-10-CM | POA: Diagnosis not present

## 2015-08-22 DIAGNOSIS — E7800 Pure hypercholesterolemia, unspecified: Secondary | ICD-10-CM | POA: Diagnosis not present

## 2015-08-22 LAB — BASIC METABOLIC PANEL: CREATININE: 2.3 mg/dL — AB (ref <=1.1)

## 2015-08-28 ENCOUNTER — Ambulatory Visit: Payer: Medicare Other | Admitting: Family Medicine

## 2015-08-29 DIAGNOSIS — I10 Essential (primary) hypertension: Secondary | ICD-10-CM | POA: Diagnosis not present

## 2015-08-29 DIAGNOSIS — N2581 Secondary hyperparathyroidism of renal origin: Secondary | ICD-10-CM | POA: Diagnosis not present

## 2015-08-29 DIAGNOSIS — M549 Dorsalgia, unspecified: Secondary | ICD-10-CM | POA: Diagnosis not present

## 2015-08-29 DIAGNOSIS — N184 Chronic kidney disease, stage 4 (severe): Secondary | ICD-10-CM | POA: Diagnosis not present

## 2015-08-29 DIAGNOSIS — D631 Anemia in chronic kidney disease: Secondary | ICD-10-CM | POA: Diagnosis not present

## 2015-09-06 ENCOUNTER — Ambulatory Visit: Payer: Medicare Other | Admitting: Family Medicine

## 2015-10-28 MED FILL — AMITRIPTYLINE HCL 10 MG TAB: 10 | 30 days supply | Qty: 30 | Fill #6

## 2015-10-28 MED FILL — GABAPENTIN 300 MG CAPSULE: 300 | 30 days supply | Qty: 60 | Fill #2

## 2015-10-29 MED FILL — LOSARTAN POTASSIUM 25 MG TA: 25 | 90 days supply | Qty: 90 | Fill #1

## 2015-10-29 MED FILL — BUPROPION HCL XL 300 MG TAB: 300 | 90 days supply | Qty: 90 | Fill #0

## 2015-10-29 MED FILL — LEVOTHYROXINE 75 MCG TABLET: 75 | 90 days supply | Qty: 90 | Fill #0

## 2015-11-07 ENCOUNTER — Ambulatory Visit (INDEPENDENT_AMBULATORY_CARE_PROVIDER_SITE_OTHER): Payer: Medicare Other | Admitting: Ophthalmology

## 2015-11-07 DIAGNOSIS — E11319 Type 2 diabetes mellitus with unspecified diabetic retinopathy without macular edema: Secondary | ICD-10-CM | POA: Diagnosis not present

## 2015-11-07 DIAGNOSIS — H43813 Vitreous degeneration, bilateral: Secondary | ICD-10-CM

## 2015-11-07 DIAGNOSIS — E113593 Type 2 diabetes mellitus with proliferative diabetic retinopathy without macular edema, bilateral: Secondary | ICD-10-CM

## 2015-11-23 DIAGNOSIS — I10 Essential (primary) hypertension: Secondary | ICD-10-CM | POA: Diagnosis not present

## 2015-11-23 DIAGNOSIS — N184 Chronic kidney disease, stage 4 (severe): Secondary | ICD-10-CM | POA: Diagnosis not present

## 2015-11-23 DIAGNOSIS — N189 Chronic kidney disease, unspecified: Secondary | ICD-10-CM | POA: Diagnosis not present

## 2015-11-23 DIAGNOSIS — N2581 Secondary hyperparathyroidism of renal origin: Secondary | ICD-10-CM | POA: Diagnosis not present

## 2015-11-23 LAB — CBC AND DIFFERENTIAL
HEMATOCRIT: 39 % (ref 36–46)
Hemoglobin: 12.7 g/dL (ref 12.0–16.0)
Platelets: 312 10*3/uL (ref 150–399)
WBC: 11.5 10^3/mL

## 2015-11-28 ENCOUNTER — Telehealth: Payer: Self-pay | Admitting: Family Medicine

## 2015-11-28 NOTE — Telephone Encounter (Signed)
Spoke to patient and told her that she would need to come in to the office. Patient stated that she could not come in today. Patient stated that she would call back.

## 2015-11-29 ENCOUNTER — Encounter: Payer: Self-pay | Admitting: Family

## 2015-11-29 ENCOUNTER — Telehealth: Payer: Self-pay

## 2015-11-29 ENCOUNTER — Ambulatory Visit (INDEPENDENT_AMBULATORY_CARE_PROVIDER_SITE_OTHER): Payer: Medicare Other | Admitting: Family

## 2015-11-29 VITALS — BP 104/68 | HR 94 | Temp 98.1°F | Resp 20 | Ht 65.0 in | Wt 301.0 lb

## 2015-11-29 DIAGNOSIS — M109 Gout, unspecified: Secondary | ICD-10-CM | POA: Insufficient documentation

## 2015-11-29 DIAGNOSIS — M10072 Idiopathic gout, left ankle and foot: Secondary | ICD-10-CM | POA: Diagnosis not present

## 2015-11-29 DIAGNOSIS — I10 Essential (primary) hypertension: Secondary | ICD-10-CM | POA: Diagnosis not present

## 2015-11-29 DIAGNOSIS — D631 Anemia in chronic kidney disease: Secondary | ICD-10-CM | POA: Diagnosis not present

## 2015-11-29 DIAGNOSIS — N184 Chronic kidney disease, stage 4 (severe): Secondary | ICD-10-CM | POA: Diagnosis not present

## 2015-11-29 DIAGNOSIS — N2581 Secondary hyperparathyroidism of renal origin: Secondary | ICD-10-CM | POA: Diagnosis not present

## 2015-11-29 MED ORDER — COLCHICINE 0.6 MG PO TABS: ORAL_TABLET | ORAL | Status: DC

## 2015-11-29 MED ORDER — PREDNISONE 10 MG (21) PO TBPK: ORAL_TABLET | ORAL | Status: DC

## 2015-11-29 MED FILL — COLCRYS 0.6 MG TABLET: 0.6 | 6 days supply | Qty: 6 | Fill #0

## 2015-11-29 MED FILL — predniSONE 10 MG (21) TBPK: 10 | 6 days supply | Qty: 21 | Fill #0

## 2015-11-29 NOTE — Progress Notes (Signed)
Pre visit review using our clinic review tool, if applicable. No additional management support is needed unless otherwise documented below in the visit note. 

## 2015-11-29 NOTE — Progress Notes (Signed)
Subjective:    Patient ID: Alice Kemp, female    DOB: [birth year], 65 y.o.   MRN: [id number]  Chief Complaint  Patient presents with  . Gout    has gout in her left leg, x2 days    HPI:  Alice Kemp is a 65 y.o. female who  has a past medical history of Hyperlipidemia; Diabetes mellitus type II; Pulmonary hypertension ([place]); Unspecified hypothyroidism; Breast abscess (07/2011); Arthritis; Depression; Skin lesion of breast (07/09/2012); PERICARDIAL EFFUSION (12/12/2010); OBSTRUCTIVE SLEEP APNEA (12/23/2010); Morbid obesity ([place]); Cervical radiculopathy at C6; Benign neoplasm of colon (06/29/2012); Anxiety; Vitamin B12 deficiency; Diastolic heart failure ([place]) (09/2013 echo); and CKD (chronic kidney disease) stage 3, GFR 30-59 ml/min. and presents today for an acute office visit.   Associated symptom of pain located in her left great toe that has been gong on for a couple of days. Described with redness and inflammation. Denies any modifying factors that make it better or worse. She has been prescribed difcloenac, however she has Stage 4 CKD. This is the 3rd gout flare in the last 4 months.   Allergies  Allergen Reactions  . Sulfa Antibiotics   . Penicillins Rash     Current Outpatient Prescriptions on File Prior to Visit  Medication Sig Dispense Refill  . amitriptyline (ELAVIL) 10 MG tablet   5  . aspirin 81 MG tablet Take 81 mg by mouth daily.      Marland Kitchen buPROPion (WELLBUTRIN XL) 300 MG 24 hr tablet Take 1 tablet (300 mg total) by mouth daily. 90 tablet 3  . celecoxib (CELEBREX) 100 MG capsule Take 100 mg by mouth as needed.    . diclofenac (VOLTAREN) 75 MG EC tablet Take 1 tablet (75 mg total) by mouth 2 (two) times daily. 60 tablet 1  . fluticasone (CUTIVATE) 0.05 % cream APPLY 3 TIMES A DAY AS NEEDED FOR ITCHING 60 g 1  . furosemide (LASIX) 80 MG tablet Take 1 tablet (80 mg total) by mouth daily. 90 tablet 3  . levocetirizine (XYZAL) 5 MG tablet Take 1 tablet (5 mg total) by mouth  at bedtime. 90 tablet 1  . levothyroxine (SYNTHROID, LEVOTHROID) 75 MCG tablet Take 75 mcg by mouth daily before breakfast.    . losartan (COZAAR) 25 MG tablet Take 1 tablet (25 mg total) by mouth daily. 90 tablet 3  . metFORMIN (GLUCOPHAGE) 1000 MG tablet   2  . NOVOLOG MIX 70/30 FLEXPEN (70-30) 100 UNIT/ML FlexPen Inject 85 units with breakfast, 60 units with lunch and 85 units with dinner. 225 mL 3  . ONETOUCH VERIO test strip   3  . simvastatin (ZOCOR) 40 MG tablet Take 1 tablet (40 mg total) by mouth daily at 6 PM. 30 tablet 3  . Vitamin D, Ergocalciferol, (DRISDOL) 50000 UNITS CAPS capsule   1   No current facility-administered medications on file prior to visit.       Review of Systems  Constitutional: Negative for fever and chills.  Musculoskeletal:       Positive for left great toe pain and swelling.       Objective:    BP 104/68 mmHg  Pulse 94  Temp(Src) 98.1 F (36.7 C) (Oral)  Resp 20  Ht 5\' 5"  (1.651 m)  Wt 301 lb (136.533 kg)  BMI 50.09 kg/m2  SpO2 94% Nursing note and vital signs reviewed.  Physical Exam  Constitutional: She is oriented to person, place, and time. She appears well-developed and well-nourished. No distress.  Cardiovascular: Normal rate, regular rhythm, normal heart sounds and intact distal pulses.   Pulmonary/Chest: Effort normal and breath sounds normal.  Musculoskeletal:   Left great toe -  Obvious redness and edema with increased temperature. No deformity.  Tenderness of first MTP joint. Range of motion decreased secondary to pain. Capillary refill intact and appropriate.  Neurological: She is alert and oriented to person, place, and time.  Skin: Skin is warm and dry.  Psychiatric: She has a normal mood and affect. Her behavior is normal. Judgment and thought content normal.     Assessment & Plan:   Problem List Items Addressed This Visit      Other   Gout - Primary     Symptoms and exam consistent with gout flare. Start colchicine  and prednisone taper. Recommend follow-up for uric acid levels and possible prophylactic therapy to prevent future outbreaks. Information regarding nutritional management provided. Follow-up in one month or sooner if needed.      Relevant Medications   colchicine (COLCRYS) 0.6 MG tablet   predniSONE (STERAPRED UNI-PAK 21 TAB) 10 MG (21) TBPK tablet

## 2015-11-29 NOTE — Patient Instructions (Signed)
Thank you for choosing [facility].  Summary/Instructions:  Your prescription(s) have been submitted to your pharmacy or been printed and provided for you. Please take as directed and contact our office if you believe you are having problem(s) with the medication(s) or have any questions.  If your symptoms worsen or fail to improve, please contact our office for further instruction, or in case of emergency go directly to the emergency room at the closest medical facility.   Gout Gout is an inflammatory arthritis caused by a buildup of uric acid crystals in the joints. Uric acid is a chemical that is normally present in the blood. When the level of uric acid in the blood is too high it can form crystals that deposit in your joints and tissues. This causes joint redness, soreness, and swelling (inflammation). Repeat attacks are common. Over time, uric acid crystals can form into masses (tophi) near a joint, destroying bone and causing disfigurement. Gout is treatable and often preventable. CAUSES  The disease begins with elevated levels of uric acid in the blood. Uric acid is produced by your body when it breaks down a naturally found substance called purines. Certain foods you eat, such as meats and fish, contain high amounts of purines. Causes of an elevated uric acid level include:  Being passed down from parent to child (heredity).  Diseases that cause increased uric acid production (such as obesity, psoriasis, and certain cancers).  Excessive alcohol use.  Diet, especially diets rich in meat and seafood.  Medicines, including certain cancer-fighting medicines (chemotherapy), water pills (diuretics), and aspirin.  Chronic kidney disease. The kidneys are no longer able to remove uric acid well.  Problems with metabolism. Conditions strongly associated with gout include:  Obesity.  High blood pressure.  High cholesterol.  Diabetes. Not everyone with elevated uric acid levels  gets gout. It is not understood why some people get gout and others do not. Surgery, joint injury, and eating too much of certain foods are some of the factors that can lead to gout attacks. SYMPTOMS   An attack of gout comes on quickly. It causes intense pain with redness, swelling, and warmth in a joint.  Fever can occur.  Often, only one joint is involved. Certain joints are more commonly involved:  Base of the big toe.  Knee.  Ankle.  Wrist.  Finger. Without treatment, an attack usually goes away in a few days to weeks. Between attacks, you usually will not have symptoms, which is different from many other forms of arthritis. DIAGNOSIS  Your caregiver will suspect gout based on your symptoms and exam. In some cases, tests may be recommended. The tests may include:  Blood tests.  Urine tests.  X-rays.  Joint fluid exam. This exam requires a needle to remove fluid from the joint (arthrocentesis). Using a microscope, gout is confirmed when uric acid crystals are seen in the joint fluid. TREATMENT  There are two phases to gout treatment: treating the sudden onset (acute) attack and preventing attacks (prophylaxis).  Treatment of an Acute Attack.  Medicines are used. These include anti-inflammatory medicines or steroid medicines.  An injection of steroid medicine into the affected joint is sometimes necessary.  The painful joint is rested. Movement can worsen the arthritis.  You may use warm or cold treatments on painful joints, depending which works best for you.  Treatment to Prevent Attacks.  If you suffer from frequent gout attacks, your caregiver may advise preventive medicine. These medicines are started after the acute   attack subsides. These medicines either help your kidneys eliminate uric acid from your body or decrease your uric acid production. You may need to stay on these medicines for a very long time.  The early phase of treatment with preventive medicine  can be associated with an increase in acute gout attacks. For this reason, during the first few months of treatment, your caregiver may also advise you to take medicines usually used for acute gout treatment. Be sure you understand your caregiver's directions. Your caregiver may make several adjustments to your medicine dose before these medicines are effective.  Discuss dietary treatment with your caregiver or dietitian. Alcohol and drinks high in sugar and fructose and foods such as meat, poultry, and seafood can increase uric acid levels. Your caregiver or dietitian can advise you on drinks and foods that should be limited. HOME CARE INSTRUCTIONS   Do not take aspirin to relieve pain. This raises uric acid levels.  Only take over-the-counter or prescription medicines for pain, discomfort, or fever as directed by your caregiver.  Rest the joint as much as possible. When in bed, keep sheets and blankets off painful areas.  Keep the affected joint raised (elevated).  Apply warm or cold treatments to painful joints. Use of warm or cold treatments depends on which works best for you.  Use crutches if the painful joint is in your leg.  Drink enough fluids to keep your urine clear or pale yellow. This helps your body get rid of uric acid. Limit alcohol, sugary drinks, and fructose drinks.  Follow your dietary instructions. Pay careful attention to the amount of protein you eat. Your daily diet should emphasize fruits, vegetables, whole grains, and fat-free or low-fat milk products. Discuss the use of coffee, vitamin C, and cherries with your caregiver or dietitian. These may be helpful in lowering uric acid levels.  Maintain a healthy body weight. SEEK MEDICAL CARE IF:   You develop diarrhea, vomiting, or any side effects from medicines.  You do not feel better in 24 hours, or you are getting worse. SEEK IMMEDIATE MEDICAL CARE IF:   Your joint becomes suddenly more tender, and you have  chills or a fever. MAKE SURE YOU:   Understand these instructions.  Will watch your condition.  Will get help right away if you are not doing well or get worse.   This information is not intended to replace advice given to you by your health care provider. Make sure you discuss any questions you have with your health care provider.   Document Released: 09/19/2000 Document Revised: 10/13/2014 Document Reviewed: 05/05/2012 Elsevier Interactive Patient Education 2016 [employer]   

## 2015-11-29 NOTE — Telephone Encounter (Signed)
PA initiated via [employer]

## 2015-11-29 NOTE — Assessment & Plan Note (Signed)
Symptoms and exam consistent with gout flare. Start colchicine and prednisone taper. Recommend follow-up for uric acid levels and possible prophylactic therapy to prevent future outbreaks. Information regarding nutritional management provided. Follow-up in one month or sooner if needed.

## 2015-11-30 MED FILL — FUROSEMIDE 80 MG TABLET: 80 | 90 days supply | Qty: 180 | Fill #0

## 2015-12-03 DIAGNOSIS — H4312 Vitreous hemorrhage, left eye: Secondary | ICD-10-CM | POA: Diagnosis not present

## 2015-12-03 DIAGNOSIS — E11319 Type 2 diabetes mellitus with unspecified diabetic retinopathy without macular edema: Secondary | ICD-10-CM | POA: Diagnosis not present

## 2015-12-03 MED ORDER — COLCRYS 0.6 MG PO TABS
ORAL_TABLET | ORAL | Status: DC
Start: 2015-12-03 — End: 2016-06-02

## 2015-12-03 NOTE — Telephone Encounter (Signed)
PA denied. Covered alternatives are Colcrys and Allopurinol, please advise on alterantive medication, thanks!

## 2015-12-03 NOTE — Telephone Encounter (Signed)
Colcrys sent to pharmacy.  

## 2015-12-05 ENCOUNTER — Encounter (INDEPENDENT_AMBULATORY_CARE_PROVIDER_SITE_OTHER): Payer: Medicare Other | Admitting: Ophthalmology

## 2015-12-05 DIAGNOSIS — E11319 Type 2 diabetes mellitus with unspecified diabetic retinopathy without macular edema: Secondary | ICD-10-CM | POA: Diagnosis not present

## 2015-12-05 DIAGNOSIS — H4312 Vitreous hemorrhage, left eye: Secondary | ICD-10-CM

## 2015-12-05 DIAGNOSIS — E113593 Type 2 diabetes mellitus with proliferative diabetic retinopathy without macular edema, bilateral: Secondary | ICD-10-CM | POA: Diagnosis not present

## 2015-12-05 DIAGNOSIS — H43813 Vitreous degeneration, bilateral: Secondary | ICD-10-CM | POA: Diagnosis not present

## 2015-12-06 ENCOUNTER — Encounter: Payer: Self-pay | Admitting: Internal Medicine

## 2015-12-07 ENCOUNTER — Telehealth: Payer: Self-pay | Admitting: Internal Medicine

## 2015-12-07 NOTE — Telephone Encounter (Signed)
Patient has appointment to transfer from [place] to [facility] on 3/31.  She is needing eye surgery and states the surgeon will not do anything until Dr.Burns looks at her.  She is requesting her appointment to be moved up.

## 2015-12-07 NOTE — Telephone Encounter (Signed)
Spoke with pt. Her appt has been moved up to 12/24/15

## 2015-12-20 ENCOUNTER — Encounter (INDEPENDENT_AMBULATORY_CARE_PROVIDER_SITE_OTHER): Payer: Medicare Other | Admitting: Ophthalmology

## 2015-12-20 DIAGNOSIS — E113593 Type 2 diabetes mellitus with proliferative diabetic retinopathy without macular edema, bilateral: Secondary | ICD-10-CM | POA: Diagnosis not present

## 2015-12-20 DIAGNOSIS — H43813 Vitreous degeneration, bilateral: Secondary | ICD-10-CM | POA: Diagnosis not present

## 2015-12-20 DIAGNOSIS — H4312 Vitreous hemorrhage, left eye: Secondary | ICD-10-CM

## 2015-12-20 DIAGNOSIS — E11319 Type 2 diabetes mellitus with unspecified diabetic retinopathy without macular edema: Secondary | ICD-10-CM

## 2015-12-20 LAB — HM DIABETES EYE EXAM

## 2015-12-24 ENCOUNTER — Ambulatory Visit (INDEPENDENT_AMBULATORY_CARE_PROVIDER_SITE_OTHER): Payer: Medicare Other | Admitting: Internal Medicine

## 2015-12-24 ENCOUNTER — Encounter: Payer: Self-pay | Admitting: Internal Medicine

## 2015-12-24 VITALS — BP 132/74 | HR 95 | Temp 98.3°F | Resp 20 | Wt 324.0 lb

## 2015-12-24 DIAGNOSIS — N183 Chronic kidney disease, stage 3 unspecified: Secondary | ICD-10-CM

## 2015-12-24 DIAGNOSIS — E1165 Type 2 diabetes mellitus with hyperglycemia: Secondary | ICD-10-CM | POA: Diagnosis not present

## 2015-12-24 DIAGNOSIS — IMO0002 Reserved for concepts with insufficient information to code with codable children: Secondary | ICD-10-CM

## 2015-12-24 DIAGNOSIS — I5032 Chronic diastolic (congestive) heart failure: Secondary | ICD-10-CM

## 2015-12-24 DIAGNOSIS — E1122 Type 2 diabetes mellitus with diabetic chronic kidney disease: Secondary | ICD-10-CM | POA: Diagnosis not present

## 2015-12-24 DIAGNOSIS — Z794 Long term (current) use of insulin: Secondary | ICD-10-CM

## 2015-12-24 MED ORDER — SPIRONOLACTONE 25 MG PO TABS: 25.0000 mg | ORAL_TABLET | Freq: Every day | ORAL | Status: DC

## 2015-12-24 NOTE — Assessment & Plan Note (Signed)
Echo [id number] EF, diastolic dysfunction Episode of volume overload 2014-treated with high-dose Lasix 80 mg twice a day and this resolved. No problems since then Currently volume overloaded. She does not feel the Lasix is working well and does not have good urination after taking the medication so I will not increase it Continue Lasix 80 mg daily Start Aldactone 25 mg daily Stressed low-sodium diet Advised increasing her activity if she is able Ultimately will need to lose weight Check CMP in 3 days

## 2015-12-24 NOTE — Assessment & Plan Note (Signed)
Stable Following with nephrology Very volume overloaded-likely related to weight gain and recent steroids Continue Lasix 80 mg daily Start Aldactone 25 mg daily-discussed with her that this is short-term only Check CMP in 3 days She is having shortness of breath and I advised her that if that gets worse or she has any other concerning symptoms she needs to go to the emergency room for IV diuresis and close monitoring of her kidney function

## 2015-12-24 NOTE — Progress Notes (Signed)
Subjective:    Patient ID: Alice Kemp, female    DOB: 09-05-1951, 65 y.o.   MRN: [id number]  HPI She is here to establish with a new pcp.   He had a hemorrhage in her left eye recently and saw her ophthalmologist.. She was not able to see for a couple of days.  It started to get better when after seeing a second ophthalmologist had laser treatment, which was last week.  She now can not see out of the left eye.  She will be following up with her routine eye doctor tomorrow.    Fluid retention:  She is retaining a lot of fluid.  This happened a couple of years ago and a full cardiology evaluation at that time was all normal.  Her lasix was increased and the fluid resolved and she has been fine since then. She was here in February for gout. She was placed on prednisone for 7 days. She states she has gained 23 pounds since then. She is having increased shortness of breath and her legs are very uncomfortable from the swelling.    Diabetes: She is following with endocrine. She is taking her medications daily as prescribed. Her sugars are relatively controlled.  Chronic kidney disease: She is following with Dr. Posey Pronto for her kidney disease. She had blood work done last month and her blood work was stable. She takes her Lasix daily. She denies any increase in sodium intake. She is fairly sedentary.   Medications and allergies reviewed with patient and updated if appropriate.  Patient Active Problem List   Diagnosis Date Noted  . Gout 11/29/2015  . Toe pain, left 07/25/2015  . Left leg injury 06/06/2015  . Palpitations   . Chronic diastolic heart failure ([place]) 09/23/2013  . CKD (chronic kidney disease), stage III   . Depression 02/11/2013  . Non-healing non-surgical wound 07/09/2012  . Benign neoplasm of colon 06/29/2012  . Stasis dermatitis 01/04/2012  . Cervical radiculopathy at C6 07/24/2011  . Left breast abscess 07/14/2011  . Morbid obesity ([place]) 02/14/2011  . Hypothyroidism  02/14/2011  . OBSTRUCTIVE SLEEP APNEA 12/23/2010  . PERICARDIAL EFFUSION 12/12/2010  . Uncontrolled type 2 diabetes mellitus with stage 3 chronic kidney disease ([place]) 12/02/2010  . EDEMA 12/02/2010  . DYSPNEA 12/02/2010    Current Outpatient Prescriptions on File Prior to Visit  Medication Sig Dispense Refill  . amitriptyline (ELAVIL) 10 MG tablet   5  . buPROPion (WELLBUTRIN XL) 300 MG 24 hr tablet Take 1 tablet (300 mg total) by mouth daily. 90 tablet 3  . celecoxib (CELEBREX) 100 MG capsule Take 100 mg by mouth as needed.    . COLCRYS 0.6 MG tablet Take 2 tablets by mouth at the onset of flair and 1 tablet by mouth 1 hour later. 3 tablet 2  . fluticasone (CUTIVATE) 0.05 % cream APPLY 3 TIMES A DAY AS NEEDED FOR ITCHING 60 g 1  . furosemide (LASIX) 80 MG tablet Take 1 tablet (80 mg total) by mouth daily. 90 tablet 3  . levothyroxine (SYNTHROID, LEVOTHROID) 75 MCG tablet Take 75 mcg by mouth daily before breakfast.    . losartan (COZAAR) 25 MG tablet Take 1 tablet (25 mg total) by mouth daily. 90 tablet 3  . NOVOLOG MIX 70/30 FLEXPEN (70-30) 100 UNIT/ML FlexPen Inject 85 units with breakfast, 60 units with lunch and 85 units with dinner. 225 mL 3  . ONETOUCH VERIO test strip   3  . simvastatin (ZOCOR)  40 MG tablet Take 1 tablet (40 mg total) by mouth daily at 6 PM. 30 tablet 3  . Vitamin D, Ergocalciferol, (DRISDOL) 50000 UNITS CAPS capsule   1  . aspirin 81 MG tablet Take 81 mg by mouth daily. Reported on 12/24/2015    . metFORMIN (GLUCOPHAGE) 1000 MG tablet Reported on 12/24/2015  2   No current facility-administered medications on file prior to visit.    Past Medical History  Diagnosis Date  . Hyperlipidemia   . Diabetes mellitus type II   . Pulmonary hypertension ([place])   . Unspecified hypothyroidism   . Breast abscess 07/2011    left; s/p I&D + abx by gen surg  . Arthritis   . Depression   . Skin lesion of breast 07/09/2012  . PERICARDIAL EFFUSION 12/12/2010  . OBSTRUCTIVE  SLEEP APNEA 12/23/2010    npsg 2012:  AHI 25/hr, severe desat to 68%.   autoset 2013: optimal pressure 13cm   . Morbid obesity ([place])   . Cervical radiculopathy at C6   . Benign neoplasm of colon 06/29/2012  . Anxiety   . Vitamin B12 deficiency   . Diastolic heart failure ([place]) 09/2013 echo  . CKD (chronic kidney disease) stage 3, GFR 30-59 ml/min     follows with renal    Past Surgical History  Procedure Laterality Date  . Shoulder surgery  2010    Left  . Cataract extraction    . Tonsillectomy  1964  . Breast biopsy  1990    Left  . Colonoscopy  06/29/2012    Procedure: COLONOSCOPY;  Surgeon: Ladene Artist, MD,FACG;  Location: [facility];  Service: Endoscopy;  Laterality: N/A;    Social History   Social History  . Marital Status: Single    Spouse Name: N/A  . Number of Children: 0  . 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 Smoking status: Never Smoker   . Smokeless tobacco: Never Used  . Alcohol Use: No  . Drug Use: No  . Sexual Activity: Not on file   Other Topics Concern  . Not on file   Social History Narrative   Lives alone.          Family History  Problem Relation Age of Onset  . Hypertension Mother   . Heart disease Mother   . Heart attack Mother   . Diabetes Father   . Heart disease Father   . Skin cancer Father   . Cancer Father     skin  . Heart attack Father   . Hyperlipidemia Father   . Colon polyps Father   . Hypertension Brother   . Skin cancer Paternal Grandfather   . Colon cancer Paternal Grandfather 80  . Breast cancer Paternal Aunt   . Cancer Paternal Aunt     breast  . Diabetes Paternal Uncle   . Diabetes Maternal Grandfather   . Sudden death Neg Hx     Review of Systems  Constitutional: Negative for fever.  HENT: Positive for facial swelling.   Eyes: Positive for visual disturbance.  Respiratory: Positive for  shortness of breath and wheezing (occasional). Negative for cough.   Cardiovascular: Positive for leg swelling. Negative for chest pain and palpitations.  Gastrointestinal: Negative for nausea and abdominal pain.       No gerd  Genitourinary: Negative for dysuria and hematuria.  Neurological:  Negative for dizziness, light-headedness and headaches.       Objective:   Filed Vitals:   12/24/15 1623  BP: 132/74  Pulse: 95  Temp: 98.3 F (36.8 C)  Resp: 20   Filed Weights   12/24/15 1623  Weight: 324 lb (146.965 kg)   Body mass index is 53.92 kg/(m^2).   Physical Exam Constitutional: Morbidly obese, no distress at rest, but quickly becomes short of breath with movement  Neck: Neck supple. No tracheal deviation present. No thyromegaly present.  No carotid bruit. No cervical adenopathy.   Cardiovascular: Normal rate, regular rhythm and normal heart sounds.   2/6 systolic murmur .  2+ bilateral lower extremity edema  Pulmonary/Chest: Effort normal and breath sounds normal. No wheezes.  Psych: Depressed affect       Assessment & Plan:   See Problem List for Assessment and Plan  of chronic medical problems.    She will follow up sooner depending on the progress with her primary and when she can get a ride here. She understands she needs to have blood work done on Thursday, no later

## 2015-12-24 NOTE — Assessment & Plan Note (Signed)
Lab Results  Component Value Date   HGBA1C 8.1* 04/10/2015   Following with endocrine Management per endocrine Continue current medications

## 2015-12-24 NOTE — Patient Instructions (Addendum)
We will add aldactone to your medication regimen to help get rid of some of your fluid. Continue the lasix.  Have blood work done Thursday to check your kidney function.     Follow up soon

## 2015-12-25 DIAGNOSIS — E11319 Type 2 diabetes mellitus with unspecified diabetic retinopathy without macular edema: Secondary | ICD-10-CM | POA: Diagnosis not present

## 2015-12-25 DIAGNOSIS — H4312 Vitreous hemorrhage, left eye: Secondary | ICD-10-CM | POA: Diagnosis not present

## 2015-12-28 ENCOUNTER — Other Ambulatory Visit (INDEPENDENT_AMBULATORY_CARE_PROVIDER_SITE_OTHER): Payer: Medicare Other

## 2015-12-28 DIAGNOSIS — N183 Chronic kidney disease, stage 3 unspecified: Secondary | ICD-10-CM

## 2015-12-28 LAB — COMPREHENSIVE METABOLIC PANEL
ALBUMIN: 3.6 g/dL (ref 3.5–5.2)
ALT: 15 U/L (ref 0–35)
AST: 19 U/L (ref 0–37)
Alkaline Phosphatase: 62 U/L (ref 39–117)
BUN: 36 mg/dL — AB (ref 6–23)
CO2: 32 meq/L (ref 19–32)
Calcium: 9.4 mg/dL (ref 8.4–10.5)
Chloride: 97 mEq/L (ref 96–112)
Creatinine, Ser: 2.37 mg/dL — ABNORMAL HIGH (ref 0.40–1.20)
GFR: 21.82 mL/min — ABNORMAL LOW (ref >60.00)
GLUCOSE: 192 mg/dL — AB (ref 70–99)
POTASSIUM: 4.4 meq/L (ref 3.5–5.1)
SODIUM: 139 meq/L (ref 135–145)
TOTAL PROTEIN: 7.6 g/dL (ref 6.0–8.3)
Total Bilirubin: 0.4 mg/dL (ref 0.2–1.2)

## 2016-01-04 ENCOUNTER — Ambulatory Visit: Payer: Medicare Other | Admitting: Internal Medicine

## 2016-01-07 ENCOUNTER — Telehealth: Payer: Self-pay | Admitting: Internal Medicine

## 2016-01-07 NOTE — Telephone Encounter (Signed)
Pt has an appt tomorrow and she is wondering if Dr. Quay Burow wants her to wait till after her eye appt to see her. Please advise

## 2016-01-07 NOTE — Telephone Encounter (Signed)
No she should come - I want to follow up on her excess fluid

## 2016-01-07 NOTE — Telephone Encounter (Signed)
Please advise 

## 2016-01-08 ENCOUNTER — Encounter: Payer: Self-pay | Admitting: Internal Medicine

## 2016-01-08 ENCOUNTER — Ambulatory Visit (INDEPENDENT_AMBULATORY_CARE_PROVIDER_SITE_OTHER): Payer: Medicare Other | Admitting: Internal Medicine

## 2016-01-08 ENCOUNTER — Other Ambulatory Visit (INDEPENDENT_AMBULATORY_CARE_PROVIDER_SITE_OTHER): Payer: Medicare Other

## 2016-01-08 VITALS — BP 116/58 | HR 110 | Temp 98.8°F | Resp 16 | Wt 304.0 lb

## 2016-01-08 DIAGNOSIS — N183 Chronic kidney disease, stage 3 unspecified: Secondary | ICD-10-CM

## 2016-01-08 DIAGNOSIS — R600 Localized edema: Secondary | ICD-10-CM | POA: Diagnosis not present

## 2016-01-08 LAB — BASIC METABOLIC PANEL
BUN: 44 mg/dL — ABNORMAL HIGH (ref 6–23)
CALCIUM: 9.6 mg/dL (ref 8.4–10.5)
CO2: 31 mEq/L (ref 19–32)
CREATININE: 2.35 mg/dL — AB (ref 0.40–1.20)
Chloride: 98 mEq/L (ref 96–112)
GFR: 22.03 mL/min — AB (ref >60.00)
Glucose, Bld: 130 mg/dL — ABNORMAL HIGH (ref 70–99)
Potassium: 3.9 mEq/L (ref 3.5–5.1)
SODIUM: 139 meq/L (ref 135–145)

## 2016-01-08 NOTE — Assessment & Plan Note (Signed)
Fluid overload resolved ad she is back at her baseline Check bmp today  Will try discontinuing Aldactone and continue Lasix only Will follow up with nephrology

## 2016-01-08 NOTE — Assessment & Plan Note (Signed)
Kidney function was stable after adding aldactone 25 mg daily to lasix 80 mg daily Recheck bmp today Will discontinue aldactone since she was well maintained on just lasix 80 mg daily Will follow up with nephrology

## 2016-01-08 NOTE — Patient Instructions (Addendum)
  Tests ordered today. Your results will be released to MyChart (or called to you) after review, usually within 72hours after test completion. If any changes need to be made, you will be notified at that same time.    

## 2016-01-08 NOTE — Progress Notes (Signed)
Subjective:    Patient ID: Alice Kemp, female    DOB: 1950-10-26, 65 y.o.   MRN: [id number]  HPI She is here for follow-up of her fluid overload and chronic kidney disease. She had been taking Lasix 80 mg daily for a long time and became fluid overloaded approximately 2 weeks ago for unknown reason.  She had been on the prednisone not long before for gout and gained  23 pounds. She denies any other changes in her lifestyle or medication. We added Aldactone 25 mg daily to her regimen and she is here for follow-up today. She states she feels much better and is back at her baseline. She denies shortness of breath, chest pain, palpitations, headaches and lightheadedness. Her lower extremity edema is much improved and is at her baseline.     Medications and allergies reviewed with patient and updated if appropriate.  Patient Active Problem List   Diagnosis Date Noted  . Gout 11/29/2015  . Toe pain, left 07/25/2015  . Left leg injury 06/06/2015  . Palpitations   . Chronic diastolic heart failure ([place]) 09/23/2013  . CKD (chronic kidney disease), stage III   . Depression 02/11/2013  . Non-healing non-surgical wound 07/09/2012  . Benign neoplasm of colon 06/29/2012  . Stasis dermatitis 01/04/2012  . Cervical radiculopathy at C6 07/24/2011  . Left breast abscess 07/14/2011  . Morbid obesity ([place]) 02/14/2011  . Hypothyroidism 02/14/2011  . OBSTRUCTIVE SLEEP APNEA 12/23/2010  . PERICARDIAL EFFUSION 12/12/2010  . Uncontrolled type 2 diabetes mellitus with stage 3 chronic kidney disease ([place]) 12/02/2010  . EDEMA 12/02/2010  . DYSPNEA 12/02/2010    Current Outpatient Prescriptions on File Prior to Visit  Medication Sig Dispense Refill  . amitriptyline (ELAVIL) 10 MG tablet   5  . aspirin 81 MG tablet Take 81 mg by mouth daily. Reported on 12/24/2015    . buPROPion (WELLBUTRIN XL) 300 MG 24 hr tablet Take 1 tablet (300 mg total) by mouth daily. 90 tablet 3  . celecoxib (CELEBREX) 100  MG capsule Take 100 mg by mouth as needed.    . COLCRYS 0.6 MG tablet Take 2 tablets by mouth at the onset of flair and 1 tablet by mouth 1 hour later. 3 tablet 2  . fluticasone (CUTIVATE) 0.05 % cream APPLY 3 TIMES A DAY AS NEEDED FOR ITCHING 60 g 1  . furosemide (LASIX) 80 MG tablet Take 1 tablet (80 mg total) by mouth daily. 90 tablet 3  . levothyroxine (SYNTHROID, LEVOTHROID) 75 MCG tablet Take 75 mcg by mouth daily before breakfast.    . losartan (COZAAR) 25 MG tablet Take 1 tablet (25 mg total) by mouth daily. 90 tablet 3  . metFORMIN (GLUCOPHAGE) 1000 MG tablet Reported on 12/24/2015  2  . NOVOLOG MIX 70/30 FLEXPEN (70-30) 100 UNIT/ML FlexPen Inject 85 units with breakfast, 60 units with lunch and 85 units with dinner. 225 mL 3  . ONETOUCH VERIO test strip   3  . simvastatin (ZOCOR) 40 MG tablet Take 1 tablet (40 mg total) by mouth daily at 6 PM. 30 tablet 3  . spironolactone (ALDACTONE) 25 MG tablet Take 1 tablet (25 mg total) by mouth daily. 30 tablet 0  . Vitamin D, Ergocalciferol, (DRISDOL) 50000 UNITS CAPS capsule   1   No current facility-administered medications on file prior to visit.    Past Medical History  Diagnosis Date  . Hyperlipidemia   . Diabetes mellitus type II   . Pulmonary hypertension (  [place])   . Unspecified hypothyroidism   . Breast abscess 07/2011    left; s/p I&D + abx by gen surg  . Arthritis   . Depression   . Skin lesion of breast 07/09/2012  . PERICARDIAL EFFUSION 12/12/2010  . OBSTRUCTIVE SLEEP APNEA 12/23/2010    npsg 2012:  AHI 25/hr, severe desat to 68%.   autoset 2013: optimal pressure 13cm   . Morbid obesity ([place])   . Cervical radiculopathy at C6   . Benign neoplasm of colon 06/29/2012  . Anxiety   . Vitamin B12 deficiency   . Diastolic heart failure ([place]) 09/2013 echo  . CKD (chronic kidney disease) stage 3, GFR 30-59 ml/min     follows with renal    Past Surgical History  Procedure Laterality Date  . Shoulder surgery  2010    Left  .  Cataract extraction    . Tonsillectomy  1964  . Breast biopsy  1990    Left  . Colonoscopy  06/29/2012    Procedure: COLONOSCOPY;  Surgeon: Ladene Artist, MD,FACG;  Location: [facility];  Service: Endoscopy;  Laterality: N/A;    Social History   Social History  . Marital Status: Single    Spouse Name: N/A  . Number of Children: 0  . 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 Smoking status: Never Smoker   . Smokeless tobacco: Never Used  . Alcohol Use: No  . Drug Use: No  . Sexual Activity: Not on file   Other Topics Concern  . Not on file   Social History Narrative   Lives alone.          Family History  Problem Relation Age of Onset  . Hypertension Mother   . Heart disease Mother   . Heart attack Mother   . Diabetes Father   . Heart disease Father   . Skin cancer Father   . Cancer Father     skin  . Heart attack Father   . Hyperlipidemia Father   . Colon polyps Father   . Hypertension Brother   . Skin cancer Paternal Grandfather   . Colon cancer Paternal Grandfather 37  . Breast cancer Paternal Aunt   . Cancer Paternal Aunt     breast  . Diabetes Paternal Uncle   . Diabetes Maternal Grandfather   . Sudden death Neg Hx     Review of Systems  Respiratory: Negative for cough, shortness of breath and wheezing.   Cardiovascular: Positive for leg swelling (at baseline). Negative for chest pain and palpitations.  Neurological: Negative for dizziness, light-headedness and headaches.       Objective:   Filed Vitals:   01/08/16 1603  BP: 116/58  Pulse: 110  Temp: 98.8 F (37.1 C)  Resp: 16   Filed Weights   01/08/16 1603  Weight: 304 lb (137.893 kg)   Body mass index is 50.59 kg/(m^2).   Physical Exam Constitutional: Appears well-developed and well-nourished. No distress.  Neck: Neck supple. No tracheal deviation present. No thyromegaly  present.  No carotid bruit. No cervical adenopathy.   Cardiovascular: Normal rate, regular rhythm and normal heart sounds.   No murmur heard.  1+ b/l LE edema Pulmonary/Chest: Effort normal and breath sounds normal. No respiratory distress. No wheezes.        Assessment & Plan:   See Problem List for Assessment  and Plan of chronic medical problems.  She will follow-up with her nephrologist

## 2016-01-08 NOTE — Progress Notes (Signed)
Patient received education resource, including the self-management goal and tool. Patient verbalized understanding. 

## 2016-01-08 NOTE — Telephone Encounter (Signed)
Spoke with pt to inform.  

## 2016-01-10 DIAGNOSIS — H4312 Vitreous hemorrhage, left eye: Secondary | ICD-10-CM | POA: Diagnosis not present

## 2016-01-14 MED FILL — AMITRIPTYLINE HCL 10 MG TAB: 10 | 30 days supply | Qty: 30 | Fill #0

## 2016-01-14 MED FILL — GABAPENTIN 300 MG CAPSULE: 300 | 30 days supply | Qty: 60 | Fill #3

## 2016-01-14 MED FILL — VIT D2 1.25 MG (50,000 UNIT: 1.25 MG | 84 days supply | Qty: 12 | Fill #1

## 2016-02-14 DIAGNOSIS — E113593 Type 2 diabetes mellitus with proliferative diabetic retinopathy without macular edema, bilateral: Secondary | ICD-10-CM | POA: Diagnosis not present

## 2016-02-18 DIAGNOSIS — E1165 Type 2 diabetes mellitus with hyperglycemia: Secondary | ICD-10-CM | POA: Diagnosis not present

## 2016-02-18 DIAGNOSIS — I10 Essential (primary) hypertension: Secondary | ICD-10-CM | POA: Diagnosis not present

## 2016-02-18 DIAGNOSIS — E039 Hypothyroidism, unspecified: Secondary | ICD-10-CM | POA: Diagnosis not present

## 2016-02-18 DIAGNOSIS — R635 Abnormal weight gain: Secondary | ICD-10-CM | POA: Diagnosis not present

## 2016-02-18 DIAGNOSIS — R809 Proteinuria, unspecified: Secondary | ICD-10-CM | POA: Diagnosis not present

## 2016-02-18 DIAGNOSIS — N189 Chronic kidney disease, unspecified: Secondary | ICD-10-CM | POA: Diagnosis not present

## 2016-02-18 DIAGNOSIS — E7800 Pure hypercholesterolemia, unspecified: Secondary | ICD-10-CM | POA: Diagnosis not present

## 2016-02-18 MED FILL — FUROSEMIDE 80 MG TABLET: 80 | 90 days supply | Qty: 180 | Fill #1

## 2016-02-18 MED FILL — AMITRIPTYLINE HCL 10 MG TAB: 10 | 30 days supply | Qty: 30 | Fill #1

## 2016-02-18 MED FILL — SIMVASTATIN 40 MG TABLET: 40 | 90 days supply | Qty: 90 | Fill #2

## 2016-02-18 MED FILL — GABAPENTIN 300 MG CAPSULE: 300 | 30 days supply | Qty: 60 | Fill #4

## 2016-02-20 DIAGNOSIS — R635 Abnormal weight gain: Secondary | ICD-10-CM | POA: Diagnosis not present

## 2016-02-26 DIAGNOSIS — E1165 Type 2 diabetes mellitus with hyperglycemia: Secondary | ICD-10-CM | POA: Diagnosis not present

## 2016-03-05 DIAGNOSIS — E113592 Type 2 diabetes mellitus with proliferative diabetic retinopathy without macular edema, left eye: Secondary | ICD-10-CM | POA: Diagnosis not present

## 2016-03-25 MED FILL — AMITRIPTYLINE HCL 10 MG TAB: 10 | 30 days supply | Qty: 30 | Fill #2

## 2016-03-25 MED FILL — GABAPENTIN 300 MG CAPSULE: 300 | 30 days supply | Qty: 60 | Fill #5

## 2016-04-11 MED FILL — COLCRYS 0.6 MG TABLET: 0.6 | 1 days supply | Qty: 3 | Fill #0

## 2016-04-21 ENCOUNTER — Ambulatory Visit (INDEPENDENT_AMBULATORY_CARE_PROVIDER_SITE_OTHER): Payer: Medicare Other | Admitting: Ophthalmology

## 2016-05-05 MED FILL — GABAPENTIN 300 MG CAPSULE: 300 | 30 days supply | Qty: 60 | Fill #6

## 2016-05-12 ENCOUNTER — Ambulatory Visit (INDEPENDENT_AMBULATORY_CARE_PROVIDER_SITE_OTHER): Payer: Medicare Other | Admitting: Ophthalmology

## 2016-05-23 ENCOUNTER — Ambulatory Visit: Payer: Medicare Other | Admitting: Internal Medicine

## 2016-06-02 ENCOUNTER — Ambulatory Visit (INDEPENDENT_AMBULATORY_CARE_PROVIDER_SITE_OTHER): Payer: Medicare Other | Admitting: Internal Medicine

## 2016-06-02 ENCOUNTER — Encounter: Payer: Self-pay | Admitting: Internal Medicine

## 2016-06-02 DIAGNOSIS — M5416 Radiculopathy, lumbar region: Secondary | ICD-10-CM | POA: Diagnosis not present

## 2016-06-02 DIAGNOSIS — N183 Chronic kidney disease, stage 3 unspecified: Secondary | ICD-10-CM

## 2016-06-02 DIAGNOSIS — M10072 Idiopathic gout, left ankle and foot: Secondary | ICD-10-CM

## 2016-06-02 DIAGNOSIS — M109 Gout, unspecified: Secondary | ICD-10-CM

## 2016-06-02 DIAGNOSIS — R29898 Other symptoms and signs involving the musculoskeletal system: Secondary | ICD-10-CM | POA: Insufficient documentation

## 2016-06-02 MED ORDER — LEVOCETIRIZINE DIHYDROCHLORIDE 5 MG PO TABS: 5.0000 mg | ORAL_TABLET | Freq: Every evening | ORAL | 5 refills | Status: DC

## 2016-06-02 MED ORDER — COLCRYS 0.6 MG PO TABS: ORAL_TABLET | ORAL | 5 refills | Status: DC

## 2016-06-02 MED ORDER — CELECOXIB 100 MG PO CAPS: 100.0000 mg | ORAL_CAPSULE | Freq: Every day | ORAL | 0 refills | Status: DC | PRN

## 2016-06-02 MED ORDER — CELECOXIB 100 MG PO CAPS: 100.0000 mg | ORAL_CAPSULE | ORAL | 0 refills | Status: DC | PRN

## 2016-06-02 MED ORDER — ALLOPURINOL 100 MG PO TABS: 100.0000 mg | ORAL_TABLET | Freq: Every day | ORAL | 6 refills | Status: DC

## 2016-06-02 MED FILL — COLCRYS 0.6 MG TABLET: 0.6 | 5 days supply | Qty: 15 | Fill #0

## 2016-06-02 MED FILL — CELECOXIB 100 MG CAPSULE: 100 | 30 days supply | Qty: 30 | Fill #0

## 2016-06-02 MED FILL — LEVOCETIRIZINE 5 MG TABLET: 5 | 30 days supply | Qty: 30 | Fill #0

## 2016-06-02 MED FILL — ALLOPURINOL 100 MG TABLET: 100 | 30 days supply | Qty: 30 | Fill #0

## 2016-06-02 NOTE — Assessment & Plan Note (Signed)
Taking tramadol and gabapentin which helps Declined ortho referral and PT Using walker to prevent falls Follow up if no improvement

## 2016-06-02 NOTE — Patient Instructions (Signed)
   Medications reviewed and updated.  Changes include starting allopurinol daily to prevent gout.  Take the colchicine only as needed.   Your prescription(s) have been submitted to your pharmacy. Please take as directed and contact our office if you believe you are having problem(s) with the medication(s).  A referral ws ordered for bariatric surgery.

## 2016-06-02 NOTE — Progress Notes (Signed)
Pre visit review using our clinic review tool, if applicable. No additional management support is needed unless otherwise documented below in the visit note. 

## 2016-06-02 NOTE — Assessment & Plan Note (Signed)
Bilateral leg weakness Likely multifactorial Encouraged weight loss, physical therapy Deferred PT Continue current medication to treat lumbar radiculopathy Use walker for now to prevent falls

## 2016-06-02 NOTE — Assessment & Plan Note (Signed)
Having frequent symptoms Start allopurinol 100 mg daily Continue colchicine as needed

## 2016-06-02 NOTE — Assessment & Plan Note (Signed)
Following with nephrology - has appt next month Will review all medications with her nephrologist

## 2016-06-02 NOTE — Progress Notes (Signed)
Subjective:    Patient ID: Alice Kemp, female    DOB: 07/08/1951, 65 y.o.   MRN: [id number]  HPI She is here for an acute visit.   Her legs are weak and she fell over the weekend.  Her legs gave way when she was going up her front stairs.  She borrowed a friend's walker and has been using it the past couple of days.  She has not fallen since.  She is depressed - she does not like the walker and feels like she can't do anything.  She has been having some left lower back pain and the pain radiates down the left leg.  She denies numbness/tingling in the left leg.  She has been taking tramadol and gabapentin and it helps a little.   Gout:  She has recurrent gout symptoms and has been taking colchicine as needed.  She feels frequent gout symptoms.   Obesity:  Has tried several things to lose weight and has not been successful.  Would like to consider bariatric surgery.      Medications and allergies reviewed with patient and updated if appropriate.  Patient Active Problem List   Diagnosis Date Noted  . Gout 11/29/2015  . Toe pain, left 07/25/2015  . Left leg injury 06/06/2015  . Palpitations   . Chronic diastolic heart failure ([place]) 09/23/2013  . CKD (chronic kidney disease), stage III   . Depression 02/11/2013  . Non-healing non-surgical wound 07/09/2012  . Benign neoplasm of colon 06/29/2012  . Stasis dermatitis 01/04/2012  . Cervical radiculopathy at C6 07/24/2011  . Left breast abscess 07/14/2011  . Morbid obesity ([place]) 02/14/2011  . Hypothyroidism 02/14/2011  . OBSTRUCTIVE SLEEP APNEA 12/23/2010  . PERICARDIAL EFFUSION 12/12/2010  . Uncontrolled type 2 diabetes mellitus with stage 3 chronic kidney disease ([place]) 12/02/2010  . EDEMA 12/02/2010  . DYSPNEA 12/02/2010    Current Outpatient Prescriptions on File Prior to Visit  Medication Sig Dispense Refill  . amitriptyline (ELAVIL) 10 MG tablet   5  . aspirin 81 MG tablet Take 81 mg by mouth daily. Reported on 12/24/2015     . buPROPion (WELLBUTRIN XL) 300 MG 24 hr tablet Take 1 tablet (300 mg total) by mouth daily. 90 tablet 3  . celecoxib (CELEBREX) 100 MG capsule Take 100 mg by mouth as needed.    . COLCRYS 0.6 MG tablet Take 2 tablets by mouth at the onset of flair and 1 tablet by mouth 1 hour later. 3 tablet 2  . fluticasone (CUTIVATE) 0.05 % cream APPLY 3 TIMES A DAY AS NEEDED FOR ITCHING 60 g 1  . furosemide (LASIX) 80 MG tablet Take 1 tablet (80 mg total) by mouth daily. 90 tablet 3  . levothyroxine (SYNTHROID, LEVOTHROID) 75 MCG tablet Take 75 mcg by mouth daily before breakfast.    . losartan (COZAAR) 25 MG tablet Take 1 tablet (25 mg total) by mouth daily. 90 tablet 3  . NOVOLOG MIX 70/30 FLEXPEN (70-30) 100 UNIT/ML FlexPen Inject 85 units with breakfast, 60 units with lunch and 85 units with dinner. 225 mL 3  . ONETOUCH VERIO test strip   3  . simvastatin (ZOCOR) 40 MG tablet Take 1 tablet (40 mg total) by mouth daily at 6 PM. 30 tablet 3  . spironolactone (ALDACTONE) 25 MG tablet Take 1 tablet (25 mg total) by mouth daily. 30 tablet 0  . Vitamin D, Ergocalciferol, (DRISDOL) 50000 UNITS CAPS capsule   1   No current  facility-administered medications on file prior to visit.     Past Medical History:  Diagnosis Date  . Anxiety   . Arthritis   . Benign neoplasm of colon 06/29/2012  . Breast abscess 07/2011   left; s/p I&D + abx by gen surg  . Cervical radiculopathy at C6   . CKD (chronic kidney disease) stage 3, GFR 30-59 ml/min    follows with renal  . Depression   . Diabetes mellitus type II   . Diastolic heart failure ([place]) 09/2013 echo  . Hyperlipidemia   . Morbid obesity ([place])   . OBSTRUCTIVE SLEEP APNEA 12/23/2010   npsg 2012:  AHI 25/hr, severe desat to 68%.   autoset 2013: optimal pressure 13cm   . PERICARDIAL EFFUSION 12/12/2010  . Pulmonary hypertension ([place])   . Skin lesion of breast 07/09/2012  . Unspecified hypothyroidism   . Vitamin B12 deficiency     Past Surgical History:    Procedure Laterality Date  . BREAST BIOPSY  1990   Left  . CATARACT EXTRACTION    . COLONOSCOPY  06/29/2012   Procedure: COLONOSCOPY;  Surgeon: Ladene Artist, MD,FACG;  Location[facility];  Service: Endoscopy;  Laterality: N/A;  . SHOULDER SURGERY  2010   Left  . TONSILLECTOMY  1964    Social History   Social History  . Marital status: Single    Spouse name: N/A  . Number of children: 0  . 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 Smoking status: Never Smoker  . Smokeless tobacco: Never Used  . Alcohol use No  . Drug use: No  . Sexual activity: Not on file   Other Topics Concern  . Not on file   Social History Narrative   Lives alone.          Family History  Problem Relation Age of Onset  . Hypertension Mother   . Heart disease Mother   . Heart attack Mother   . Diabetes Father   . Heart disease Father   . Skin cancer Father   . Cancer Father     skin  . Heart attack Father   . Hyperlipidemia Father   . Colon polyps Father   . Hypertension Brother   . Skin cancer Paternal Grandfather   . Colon cancer Paternal Grandfather 55  . Breast cancer Paternal Aunt   . Cancer Paternal Aunt     breast  . Diabetes Paternal Uncle   . Diabetes Maternal Grandfather   . Sudden death Neg Hx     Review of Systems  Constitutional: Negative for fever.  Musculoskeletal: Positive for arthralgias and back pain (radiating to left leg).  Neurological: Positive for weakness (both legs). Negative for numbness.       Objective:   Vitals:   06/02/16 1614  BP: 134/82  Pulse: (!) 106  Resp: 20  Temp: 98 F (36.7 C)   Filed Weights   06/02/16 1614  Weight: (!) 309 lb (140.2 kg)   Body mass index is 53.04 kg/m.   Physical Exam  Constitutional: She appears well-developed and well-nourished. No distress.  Morbidly obese  Musculoskeletal: She exhibits  edema (mild b/l LE edema).  Tenderness with palpation in left lower back/buttock region  Neurological:  B/l LE mild weakness - non-focal  Skin: Skin is warm and dry. She is not diaphoretic.  Assessment & Plan:   See Problem List for Assessment and Plan of chronic medical problems.

## 2016-06-02 NOTE — Assessment & Plan Note (Signed)
States she has tried everything - thinking about bariatric surgery Will refer to bariatric surgery to see if she is a candidate

## 2016-06-03 MED FILL — GABAPENTIN 300 MG CAPSULE: 300 | 30 days supply | Qty: 60 | Fill #0

## 2016-06-03 MED FILL — FUROSEMIDE 80 MG TABLET: 80 | 90 days supply | Qty: 180 | Fill #2

## 2016-06-04 ENCOUNTER — Ambulatory Visit (INDEPENDENT_AMBULATORY_CARE_PROVIDER_SITE_OTHER): Payer: Medicare Other | Admitting: Ophthalmology

## 2016-06-10 DIAGNOSIS — N184 Chronic kidney disease, stage 4 (severe): Secondary | ICD-10-CM | POA: Diagnosis not present

## 2016-06-10 DIAGNOSIS — N189 Chronic kidney disease, unspecified: Secondary | ICD-10-CM | POA: Diagnosis not present

## 2016-06-10 DIAGNOSIS — N2581 Secondary hyperparathyroidism of renal origin: Secondary | ICD-10-CM | POA: Diagnosis not present

## 2016-06-11 LAB — VITAMIN D 1,25 DIHYDROXY: VIT D 25 HYDROXY: 24.6

## 2016-06-13 DIAGNOSIS — N2581 Secondary hyperparathyroidism of renal origin: Secondary | ICD-10-CM | POA: Diagnosis not present

## 2016-06-13 DIAGNOSIS — N184 Chronic kidney disease, stage 4 (severe): Secondary | ICD-10-CM | POA: Diagnosis not present

## 2016-06-13 DIAGNOSIS — I10 Essential (primary) hypertension: Secondary | ICD-10-CM | POA: Diagnosis not present

## 2016-06-13 DIAGNOSIS — D631 Anemia in chronic kidney disease: Secondary | ICD-10-CM | POA: Diagnosis not present

## 2016-06-20 ENCOUNTER — Telehealth: Payer: Self-pay | Admitting: Internal Medicine

## 2016-06-20 NOTE — Telephone Encounter (Signed)
Pt called back regarding the referral for weight loss surgery.  She thought you would be looking into other options for her before deciding to do the surgery.  Please advise. Lovena Le, can you please call her back with Dr. Quay Burow response.

## 2016-06-20 NOTE — Telephone Encounter (Signed)
PLease advise

## 2016-06-20 NOTE — Telephone Encounter (Signed)
Most of the weight loss medications are not advised due to her diabetes and kidney disease.   I am not sure what other options she is interested in?

## 2016-06-23 NOTE — Telephone Encounter (Signed)
Spoke with pt. Pt states shes not really sure what she wants to do. She asked about Optifast. She states she used this in the past with a PCP years ago and would like to know if this is something we can put her on. Please advise.

## 2016-06-25 NOTE — Telephone Encounter (Signed)
This is something she could try.  It is not a prescription - she would have to do it on her own.

## 2016-06-26 NOTE — Telephone Encounter (Signed)
Spoke with pt, states that [facility] has an Scientist, research (physical sciences). She is going to find more information out about that. Told pt to contact us if a referral is needed.

## 2016-07-02 ENCOUNTER — Encounter: Payer: Self-pay | Admitting: Internal Medicine

## 2016-07-08 ENCOUNTER — Other Ambulatory Visit: Payer: Self-pay | Admitting: Internal Medicine

## 2016-07-08 MED FILL — AMITRIPTYLINE HCL 10 MG TAB: 10 | 30 days supply | Qty: 30 | Fill #3

## 2016-07-08 MED FILL — LOSARTAN POTASSIUM 25 MG TA: 25 | 90 days supply | Qty: 90 | Fill #0

## 2016-07-08 MED FILL — LEVOCETIRIZINE 5 MG TABLET: 5 | 30 days supply | Qty: 30 | Fill #1

## 2016-07-08 MED FILL — GABAPENTIN 300 MG CAPSULE: 300 | 30 days supply | Qty: 60 | Fill #1

## 2016-07-08 MED FILL — LEVOTHYROXINE 75 MCG TABLET: 75 | 90 days supply | Qty: 90 | Fill #0

## 2016-07-08 MED FILL — VIT D2 1.25 MG (50,000 UNIT: 1.25 MG | 84 days supply | Qty: 12 | Fill #2

## 2016-07-08 MED FILL — COLCRYS 0.6 MG TABLET: 0.6 | 5 days supply | Qty: 15 | Fill #1

## 2016-07-08 MED FILL — ALLOPURINOL 100 MG TABLET: 100 | 30 days supply | Qty: 30 | Fill #1

## 2016-07-10 ENCOUNTER — Ambulatory Visit (INDEPENDENT_AMBULATORY_CARE_PROVIDER_SITE_OTHER): Payer: Medicare Other

## 2016-07-10 DIAGNOSIS — Z23 Encounter for immunization: Secondary | ICD-10-CM

## 2016-08-07 DIAGNOSIS — E113593 Type 2 diabetes mellitus with proliferative diabetic retinopathy without macular edema, bilateral: Secondary | ICD-10-CM | POA: Diagnosis not present

## 2016-08-19 LAB — HM DIABETES EYE EXAM

## 2016-08-19 MED FILL — GABAPENTIN 300 MG CAPSULE: 300 | 30 days supply | Qty: 60 | Fill #2

## 2016-08-19 MED FILL — AMITRIPTYLINE HCL 10 MG TAB: 10 | 30 days supply | Qty: 30 | Fill #4

## 2016-08-19 MED FILL — ALLOPURINOL 100 MG TABLET: 100 | 30 days supply | Qty: 30 | Fill #2

## 2016-08-19 MED FILL — SIMVASTATIN 40 MG TABLET: 40 | 90 days supply | Qty: 90 | Fill #0

## 2016-08-19 MED FILL — LEVOCETIRIZINE 5 MG TABLET: 5 | 30 days supply | Qty: 30 | Fill #2

## 2016-08-21 ENCOUNTER — Telehealth: Payer: Self-pay | Admitting: *Deleted

## 2016-08-21 DIAGNOSIS — I10 Essential (primary) hypertension: Secondary | ICD-10-CM | POA: Diagnosis not present

## 2016-08-21 DIAGNOSIS — N189 Chronic kidney disease, unspecified: Secondary | ICD-10-CM | POA: Diagnosis not present

## 2016-08-21 DIAGNOSIS — E7800 Pure hypercholesterolemia, unspecified: Secondary | ICD-10-CM | POA: Diagnosis not present

## 2016-08-21 DIAGNOSIS — E1165 Type 2 diabetes mellitus with hyperglycemia: Secondary | ICD-10-CM | POA: Diagnosis not present

## 2016-08-21 NOTE — Telephone Encounter (Signed)
She needs to discuss that with her kidney doctor because of her decreased kidney function.

## 2016-08-21 NOTE — Telephone Encounter (Signed)
Pt left msg on triage stating MD rx her another medication to take along with the furosemide to help with the fluid. Requesting rx to be called into ...[facility]

## 2016-08-22 MED ORDER — SPIRONOLACTONE 25 MG PO TABS: 25.0000 mg | ORAL_TABLET | Freq: Every day | ORAL | 1 refills | Status: DC

## 2016-08-22 MED FILL — SPIRONOLACTONE 25 MG TABLET: 25 | 90 days supply | Qty: 90 | Fill #0

## 2016-08-22 NOTE — Telephone Encounter (Signed)
Medication sent to [facility].

## 2016-08-22 NOTE — Telephone Encounter (Signed)
Pt stated that she is wanting a refill of spironolactone (this is currently on med list).   Gave pt response regarding kidney function. Pt stated that she would contact kindney MD for advise/recommendation re fluid retention.

## 2016-09-18 ENCOUNTER — Encounter: Payer: Self-pay | Admitting: Internal Medicine

## 2016-09-23 ENCOUNTER — Other Ambulatory Visit: Payer: Self-pay | Admitting: Internal Medicine

## 2016-09-23 MED FILL — GABAPENTIN 300 MG CAPSULE: 300 | 30 days supply | Qty: 60 | Fill #3

## 2016-09-23 MED FILL — BUPROPION HCL XL 300 MG TAB: 300 | 90 days supply | Qty: 90 | Fill #0

## 2016-10-27 MED FILL — LEVOCETIRIZINE 5 MG TABLET: 5 | 30 days supply | Qty: 30 | Fill #3

## 2016-10-27 MED FILL — COLCHICINE 0.6 MG TABLET: 0.6 | 30 days supply | Qty: 15 | Fill #0

## 2016-10-27 MED FILL — ALLOPURINOL 100 MG TABLET: 100 | 30 days supply | Qty: 30 | Fill #3

## 2016-10-27 MED FILL — GABAPENTIN 300 MG CAPSULE: 300 | 30 days supply | Qty: 60 | Fill #4

## 2016-10-27 MED FILL — AMITRIPTYLINE HCL 10 MG TAB: 10 | 30 days supply | Qty: 30 | Fill #5

## 2016-10-28 ENCOUNTER — Ambulatory Visit (INDEPENDENT_AMBULATORY_CARE_PROVIDER_SITE_OTHER): Payer: PPO | Admitting: Internal Medicine

## 2016-10-28 ENCOUNTER — Encounter: Payer: Self-pay | Admitting: Internal Medicine

## 2016-10-28 VITALS — BP 152/84 | HR 116 | Temp 98.3°F | Resp 20 | Wt 314.0 lb

## 2016-10-28 DIAGNOSIS — J0100 Acute maxillary sinusitis, unspecified: Secondary | ICD-10-CM | POA: Insufficient documentation

## 2016-10-28 MED ORDER — DOXYCYCLINE HYCLATE 100 MG PO TABS: 100.0000 mg | ORAL_TABLET | Freq: Two times a day (BID) | ORAL | 0 refills | Status: DC

## 2016-10-28 MED FILL — DOXYCYCLINE HYCLATE 100 MG: 100 | 10 days supply | Qty: 20 | Fill #0

## 2016-10-28 NOTE — Progress Notes (Signed)
Pre visit review using our clinic review tool, if applicable. No additional management support is needed unless otherwise documented below in the visit note. 

## 2016-10-28 NOTE — Patient Instructions (Signed)

## 2016-10-28 NOTE — Progress Notes (Signed)
Subjective:    Patient ID: Alice Kemp, female    DOB: [birth year], 66 y.o.   MRN: [id number]  HPI She is here for an acute visit for cold symptoms.   Her symptoms started as a cold for two weeks, but have persisted and progressed into what she thinks is a sinus infection.   She is experiencing fever/chills, sinus pain, nasal congestion, ear pressure, teeth pain, sore throat, headaches, dry cough, sob, and mild wheeze.    She has tried taking mucinx and alka seltzer plus with some improvement in symptoms.   Medications and allergies reviewed with patient and updated if appropriate.  Patient Active Problem List   Diagnosis Date Noted  . Lumbar radiculopathy 06/02/2016  . Leg weakness, bilateral 06/02/2016  . Gout 11/29/2015  . Toe pain, left 07/25/2015  . Palpitations   . Chronic diastolic heart failure ([place] ) 09/23/2013  . CKD (chronic kidney disease), stage III   . Depression 02/11/2013  . Benign neoplasm of colon 06/29/2012  . Stasis dermatitis 01/04/2012  . Cervical radiculopathy at C6 07/24/2011  . Morbid obesity ([place]) 02/14/2011  . Hypothyroidism 02/14/2011  . OBSTRUCTIVE SLEEP APNEA 12/23/2010  . PERICARDIAL EFFUSION 12/12/2010  . Uncontrolled type 2 diabetes mellitus with stage 3 chronic kidney disease ([place]) 12/02/2010  . EDEMA 12/02/2010  . DYSPNEA 12/02/2010    Current Outpatient Prescriptions on File Prior to Visit  Medication Sig Dispense Refill  . allopurinol (ZYLOPRIM) 100 MG tablet Take 1 tablet (100 mg total) by mouth daily. 30 tablet 6  . amitriptyline (ELAVIL) 10 MG tablet   5  . aspirin 81 MG tablet Take 81 mg by mouth daily. Reported on 12/24/2015    . buPROPion (WELLBUTRIN XL) 300 MG 24 hr tablet TAKE 1 TABLET BY MOUTH DAILY 90 tablet 1  . celecoxib (CELEBREX) 100 MG capsule Take 1 capsule (100 mg total) by mouth daily as needed. 30 capsule 0  . COLCRYS 0.6 MG tablet Take 2 tablets by mouth at the onset of flair and 1 tablet by mouth 1 hour later.  15 tablet 5  . fluticasone (CUTIVATE) 0.05 % cream APPLY 3 TIMES A DAY AS NEEDED FOR ITCHING 60 g 1  . furosemide (LASIX) 80 MG tablet Take 1 tablet (80 mg total) by mouth daily. 90 tablet 3  . gabapentin (NEURONTIN) 300 MG capsule     . levocetirizine (XYZAL) 5 MG tablet Take 1 tablet (5 mg total) by mouth every evening. 30 tablet 5  . levothyroxine (SYNTHROID, LEVOTHROID) 75 MCG tablet Take 75 mcg by mouth daily before breakfast.    . losartan (COZAAR) 25 MG tablet TAKE 1 TABLET BY MOUTH ONCE DAILY 90 tablet 3  . NOVOLOG MIX 70/30 FLEXPEN (70-30) 100 UNIT/ML FlexPen Inject 85 units with breakfast, 60 units with lunch and 85 units with dinner. 225 mL 3  . ONETOUCH VERIO test strip   3  . simvastatin (ZOCOR) 40 MG tablet Take 1 tablet (40 mg total) by mouth daily at 6 PM. 30 tablet 3  . spironolactone (ALDACTONE) 25 MG tablet Take 1 tablet (25 mg total) by mouth daily. 90 tablet 1  . Vitamin D, Ergocalciferol, (DRISDOL) 50000 UNITS CAPS capsule   1   No current facility-administered medications on file prior to visit.     Past Medical History:  Diagnosis Date  . Anxiety   . Arthritis   . Benign neoplasm of colon 06/29/2012  . Breast abscess 07/2011   left; s/p I&D +  abx by gen surg  . Cervical radiculopathy at C6   . CKD (chronic kidney disease) stage 3, GFR 30-59 ml/min    follows with renal  . Depression   . Diabetes mellitus type II   . Diastolic heart failure ([place]) 09/2013 echo  . Hyperlipidemia   . Morbid obesity ([place])   . OBSTRUCTIVE SLEEP APNEA 12/23/2010   npsg 2012:  AHI 25/hr, severe desat to 68%.   autoset 2013: optimal pressure 13cm   . PERICARDIAL EFFUSION 12/12/2010  . Pulmonary hypertension   . Skin lesion of breast 07/09/2012  . Unspecified hypothyroidism   . Vitamin B12 deficiency     Past Surgical History:  Procedure Laterality Date  . BREAST BIOPSY  1990   Left  . CATARACT EXTRACTION    . COLONOSCOPY  06/29/2012   Procedure: COLONOSCOPY;  Surgeon: Ladene Artist, MD,FACG;  Location: [facility];  Service: Endoscopy;  Laterality: N/A;  . SHOULDER SURGERY  2010   Left  . TONSILLECTOMY  1964    Social History   Social History  . Marital status: Single    Spouse name: N/A  . Number of children: 0  . 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 Smoking status: Never Smoker  . Smokeless tobacco: Never Used  . Alcohol use No  . Drug use: No  . Sexual activity: Not on file   Other Topics Concern  . Not on file   Social History Narrative   Lives alone.          Family History  Problem Relation Age of Onset  . Hypertension Mother   . Heart disease Mother   . Heart attack Mother   . Diabetes Father   . Heart disease Father   . Skin cancer Father   . Cancer Father     skin  . Heart attack Father   . Hyperlipidemia Father   . Colon polyps Father   . Hypertension Brother   . Skin cancer Paternal Grandfather   . Colon cancer Paternal Grandfather 3  . Breast cancer Paternal Aunt   . Cancer Paternal Aunt     breast  . Diabetes Paternal Uncle   . Diabetes Maternal Grandfather   . Sudden death Neg Hx     Review of Systems  Constitutional: Positive for chills and fever.  HENT: Positive for congestion, ear pain (ear pain / pressure), sinus pain and sore throat.        Teeth pain  Respiratory: Positive for cough (dry), shortness of breath and wheezing (mild).   Cardiovascular: Positive for leg swelling (at baseline). Negative for chest pain and palpitations.  Musculoskeletal: Negative for myalgias.  Neurological: Positive for headaches.       Objective:   Vitals:   10/28/16 1613  BP: (!) 152/84  Pulse: (!) 116  Resp: 20  Temp: 98.3 F (36.8 C)   Filed Weights   10/28/16 1613  Weight: (!) 314 lb (142.4 kg)   Body mass index is 53.9 kg/m.  Wt Readings from Last 3 Encounters:  10/28/16 (!) 314 lb (142.4  kg)  06/02/16 (!) 309 lb (140.2 kg)  01/08/16 (!) 304 lb (137.9 kg)     Physical Exam GENERAL APPEARANCE: Appears stated age, well appearing, NAD EYES: conjunctiva clear, no icterus HEENT: bilateral tympanic membranes and ear canals normal, oropharynx with mild erythema, no  thyromegaly, trachea midline, no cervical or supraclavicular lymphadenopathy LUNGS: Clear to auscultation without wheeze or crackles, unlabored breathing, good air entry bilaterally HEART: Normal S1,S2 without murmurs EXTREMITIES: chronic LE  Edema b/l      Assessment & Plan:   See Problem List for Assessment and Plan of chronic medical problems.

## 2016-10-28 NOTE — Assessment & Plan Note (Signed)
Symptoms consistent with bacterial infection Will start doxycycline Continue otc cold medications Call or return if no improvement

## 2016-10-30 MED FILL — FUROSEMIDE 80 MG TABLET: 80 | 90 days supply | Qty: 180 | Fill #0

## 2016-11-17 DIAGNOSIS — E11319 Type 2 diabetes mellitus with unspecified diabetic retinopathy without macular edema: Secondary | ICD-10-CM | POA: Diagnosis not present

## 2016-11-17 DIAGNOSIS — H40023 Open angle with borderline findings, high risk, bilateral: Secondary | ICD-10-CM | POA: Diagnosis not present

## 2016-11-25 ENCOUNTER — Other Ambulatory Visit: Payer: Self-pay | Admitting: *Deleted

## 2016-11-25 MED ORDER — LEVOCETIRIZINE DIHYDROCHLORIDE 5 MG PO TABS: 5.0000 mg | ORAL_TABLET | Freq: Every evening | ORAL | 1 refills | Status: DC

## 2016-11-25 MED FILL — LEVOCETIRIZINE 5 MG TABLET: 5 | 90 days supply | Qty: 90 | Fill #0

## 2016-11-25 MED FILL — AMITRIPTYLINE HCL 10 MG TAB: 10 | 90 days supply | Qty: 90 | Fill #0

## 2016-11-26 MED FILL — LOSARTAN POTASSIUM 25 MG TA: 25 | 90 days supply | Qty: 90 | Fill #1

## 2016-12-08 DIAGNOSIS — N184 Chronic kidney disease, stage 4 (severe): Secondary | ICD-10-CM | POA: Diagnosis not present

## 2016-12-08 DIAGNOSIS — N2581 Secondary hyperparathyroidism of renal origin: Secondary | ICD-10-CM | POA: Diagnosis not present

## 2016-12-08 DIAGNOSIS — N189 Chronic kidney disease, unspecified: Secondary | ICD-10-CM | POA: Diagnosis not present

## 2016-12-12 DIAGNOSIS — N184 Chronic kidney disease, stage 4 (severe): Secondary | ICD-10-CM | POA: Diagnosis not present

## 2016-12-12 DIAGNOSIS — D631 Anemia in chronic kidney disease: Secondary | ICD-10-CM | POA: Diagnosis not present

## 2016-12-12 DIAGNOSIS — N2581 Secondary hyperparathyroidism of renal origin: Secondary | ICD-10-CM | POA: Diagnosis not present

## 2016-12-12 DIAGNOSIS — I10 Essential (primary) hypertension: Secondary | ICD-10-CM | POA: Diagnosis not present

## 2016-12-12 MED FILL — VIT D2 1.25 MG (50,000 UNIT: 1.25 MG | 84 days supply | Qty: 12 | Fill #0

## 2016-12-15 LAB — BASIC METABOLIC PANEL
BUN: 27 mg/dL — AB (ref 4–21)
Creatinine: 2.1 mg/dL — AB (ref 0.5–1.1)
SODIUM: 133 mmol/L — AB (ref 137–147)

## 2016-12-15 LAB — VITAMIN D 25 HYDROXY (VIT D DEFICIENCY, FRACTURES): Vit D, 25-Hydroxy: 21.7

## 2016-12-15 LAB — CBC AND DIFFERENTIAL
HEMATOCRIT: 39 % (ref 36–46)
Hemoglobin: 12.6 g/dL (ref 12.0–16.0)
Platelets: 213 10*3/uL (ref 150–399)
WBC: 9 10^3/mL

## 2016-12-18 ENCOUNTER — Telehealth: Payer: Self-pay | Admitting: Internal Medicine

## 2016-12-18 MED ORDER — COLCRYS 0.6 MG PO TABS: ORAL_TABLET | ORAL | 2 refills | Status: DC

## 2016-12-18 NOTE — Telephone Encounter (Signed)
RX has been sent.

## 2016-12-18 NOTE — Telephone Encounter (Signed)
Pt called requesting a refill on COLCRYS 0.6 MG tablet for 90 days to [facility]. Thanks [employer]

## 2016-12-19 MED FILL — COLCHICINE 0.6 MG TABLET: 0.6 | 5 days supply | Qty: 15 | Fill #0

## 2016-12-22 MED FILL — GABAPENTIN 300 MG CAPSULE: 300 | 30 days supply | Qty: 60 | Fill #5

## 2017-01-05 ENCOUNTER — Other Ambulatory Visit: Payer: Self-pay | Admitting: *Deleted

## 2017-01-05 MED ORDER — CELECOXIB 100 MG PO CAPS: 100.0000 mg | ORAL_CAPSULE | Freq: Every day | ORAL | 0 refills | Status: DC | PRN

## 2017-01-05 MED ORDER — ALLOPURINOL 100 MG PO TABS: 100.0000 mg | ORAL_TABLET | Freq: Every day | ORAL | 0 refills | Status: DC

## 2017-01-05 MED FILL — SPIRONOLACTONE 25 MG TABLET: 25 | 90 days supply | Qty: 90 | Fill #1

## 2017-01-05 MED FILL — CELECOXIB 100 MG CAPSULE: 100 | 90 days supply | Qty: 90 | Fill #0

## 2017-01-05 MED FILL — SIMVASTATIN 40 MG TABLET: 40 | 90 days supply | Qty: 90 | Fill #1

## 2017-01-05 MED FILL — ALLOPURINOL 100 MG TABLET: 100 | 90 days supply | Qty: 90 | Fill #0

## 2017-01-05 NOTE — Telephone Encounter (Signed)
Rec'd call pt states she is needing 90 day script on her Allopurinol & Celebrex. Verified chart pt is up-to-date sent 90 day to [place]...Johny Chess

## 2017-01-20 ENCOUNTER — Telehealth: Payer: Self-pay | Admitting: *Deleted

## 2017-01-20 NOTE — Telephone Encounter (Signed)
Rec'd  A coverage determination request form on pt Celebrex. Completed form and faxed back to [facility] Tier Exception @ [phone]...Johny Chess

## 2017-01-21 NOTE — Telephone Encounter (Signed)
Rec'd approval from 01/20/17-10/05/17.

## 2017-01-27 ENCOUNTER — Encounter: Payer: Self-pay | Admitting: Internal Medicine

## 2017-01-27 ENCOUNTER — Ambulatory Visit (INDEPENDENT_AMBULATORY_CARE_PROVIDER_SITE_OTHER): Payer: PPO | Admitting: Internal Medicine

## 2017-01-27 VITALS — BP 122/76 | HR 120 | Ht 64.0 in | Wt 309.2 lb

## 2017-01-27 DIAGNOSIS — IMO0002 Reserved for concepts with insufficient information to code with codable children: Secondary | ICD-10-CM

## 2017-01-27 DIAGNOSIS — F3289 Other specified depressive episodes: Secondary | ICD-10-CM | POA: Diagnosis not present

## 2017-01-27 DIAGNOSIS — M79604 Pain in right leg: Secondary | ICD-10-CM

## 2017-01-27 DIAGNOSIS — N183 Chronic kidney disease, stage 3 (moderate): Secondary | ICD-10-CM | POA: Diagnosis not present

## 2017-01-27 DIAGNOSIS — M79605 Pain in left leg: Secondary | ICD-10-CM

## 2017-01-27 DIAGNOSIS — Z794 Long term (current) use of insulin: Secondary | ICD-10-CM

## 2017-01-27 DIAGNOSIS — E1122 Type 2 diabetes mellitus with diabetic chronic kidney disease: Secondary | ICD-10-CM

## 2017-01-27 DIAGNOSIS — E1165 Type 2 diabetes mellitus with hyperglycemia: Secondary | ICD-10-CM | POA: Diagnosis not present

## 2017-01-27 MED ORDER — DULOXETINE HCL 20 MG PO CPEP: 20.0000 mg | ORAL_CAPSULE | Freq: Every day | ORAL | 1 refills | Status: DC

## 2017-01-27 NOTE — Assessment & Plan Note (Signed)
Not obviously neuropathy, but that may be some of her pain ? Radiculopathy from back or radiating pain from knees Increase gabapentin to 300 mg TID or 300 in am, 600 at bedtime discussed possible neurology or ortho/sports referral Hopefully Cymbalta will also help

## 2017-01-27 NOTE — Patient Instructions (Addendum)
   Medications reviewed and updated.  Changes include increasing the gabapentin to 300 mg three times a day.   If your leg pain does not improve we can consider a referral to sports medicine or orthopedics.    Call or return if no improvement   Stop the wellbutrin.  Start cymbalta 20 mg daily.  Call with side effects.   Follow up in 2 months, sooner if needed

## 2017-01-27 NOTE — Assessment & Plan Note (Signed)
Management per Dr Chalmers Cater - sees her next month

## 2017-01-27 NOTE — Assessment & Plan Note (Signed)
Not controlled Stop wellbutrin Start cymbalta 20 mg daily Follow up in 2 months, sooner if needed

## 2017-01-27 NOTE — Progress Notes (Signed)
Subjective:    Patient ID: Alice Kemp, female    DOB: 24-May-1951, 66 y.o.   MRN: [id number]  HPI She is here for an acute visit.   Leg pain:  She has b/l leg pain below her knees.  Rarely does the pain extend to her feet.  A couple of weeks ago the pain was above the knees.  She can not stand too long.  They hurt constantly - it does not matter what position she is in.  Standing and walking makes it worse.  Leg cramps at night - not every night.  Her pain is a 7/10.  She takes gabapentin - she does not think it helps.  She denies numbness/tingling.  The pain is chronic, but it has gotten worse.  She has some back pain, but not severe.    She feels depressed.  She feels empty and without purpose.  She has no motivation.  She does not want to talk to a therapist.  She is taking her wellbutrin daily, but does not think it helps.   Medications and allergies reviewed with patient and updated if appropriate.  Patient Active Problem List   Diagnosis Date Noted  . Lumbar radiculopathy 06/02/2016  . Leg weakness, bilateral 06/02/2016  . Gout 11/29/2015  . Toe pain, left 07/25/2015  . Palpitations   . Chronic diastolic heart failure ([place]) 09/23/2013  . CKD (chronic kidney disease), stage III   . Depression 02/11/2013  . Benign neoplasm of colon 06/29/2012  . Stasis dermatitis 01/04/2012  . Cervical radiculopathy at C6 07/24/2011  . Morbid obesity ([place]) 02/14/2011  . Hypothyroidism 02/14/2011  . OBSTRUCTIVE SLEEP APNEA 12/23/2010  . PERICARDIAL EFFUSION 12/12/2010  . Uncontrolled type 2 diabetes mellitus with stage 3 chronic kidney disease ([place]) 12/02/2010  . EDEMA 12/02/2010  . DYSPNEA 12/02/2010    Current Outpatient Prescriptions on File Prior to Visit  Medication Sig Dispense Refill  . allopurinol (ZYLOPRIM) 100 MG tablet Take 1 tablet (100 mg total) by mouth daily. Yearly physical w/labs due in August must see MD for refills 90 tablet 0  . amitriptyline (ELAVIL) 10 MG tablet    5  . aspirin 81 MG tablet Take 81 mg by mouth daily. Reported on 12/24/2015    . celecoxib (CELEBREX) 100 MG capsule Take 1 capsule (100 mg total) by mouth daily as needed. Yearly physical w/labs due in August must see MD for future refills 90 capsule 0  . COLCRYS 0.6 MG tablet Take 2 tablets by mouth at the onset of flair and 1 tablet by mouth 1 hour later. 15 tablet 2  . fluticasone (CUTIVATE) 0.05 % cream APPLY 3 TIMES A DAY AS NEEDED FOR ITCHING 60 g 1  . furosemide (LASIX) 80 MG tablet Take 1 tablet (80 mg total) by mouth daily. 90 tablet 3  . gabapentin (NEURONTIN) 300 MG capsule     . levocetirizine (XYZAL) 5 MG tablet Take 1 tablet (5 mg total) by mouth every evening. Yearly physical w/labs due in august must see MD for refills 90 tablet 1  . levothyroxine (SYNTHROID, LEVOTHROID) 75 MCG tablet Take 75 mcg by mouth daily before breakfast.    . losartan (COZAAR) 25 MG tablet TAKE 1 TABLET BY MOUTH ONCE DAILY 90 tablet 3  . NOVOLOG MIX 70/30 FLEXPEN (70-30) 100 UNIT/ML FlexPen Inject 85 units with breakfast, 60 units with lunch and 85 units with dinner. 225 mL 3  . ONETOUCH VERIO test strip   3  .  simvastatin (ZOCOR) 40 MG tablet Take 1 tablet (40 mg total) by mouth daily at 6 PM. 30 tablet 3  . spironolactone (ALDACTONE) 25 MG tablet Take 1 tablet (25 mg total) by mouth daily. 90 tablet 1  . Vitamin D, Ergocalciferol, (DRISDOL) 50000 UNITS CAPS capsule   1   No current facility-administered medications on file prior to visit.     Past Medical History:  Diagnosis Date  . Anxiety   . Arthritis   . Benign neoplasm of colon 06/29/2012  . Breast abscess 07/2011   left; s/p I&D + abx by gen surg  . Cervical radiculopathy at C6   . CKD (chronic kidney disease) stage 3, GFR 30-59 ml/min    follows with renal  . Depression   . Diabetes mellitus type II   . Diastolic heart failure ([place]) 09/2013 echo  . Hyperlipidemia   . Morbid obesity ([place])   . OBSTRUCTIVE SLEEP APNEA 12/23/2010   npsg  2012:  AHI 25/hr, severe desat to 68%.   autoset 2013: optimal pressure 13cm   . PERICARDIAL EFFUSION 12/12/2010  . Pulmonary hypertension ([place])   . Skin lesion of breast 07/09/2012  . Unspecified hypothyroidism   . Vitamin B12 deficiency     Past Surgical History:  Procedure Laterality Date  . BREAST BIOPSY  1990   Left  . CATARACT EXTRACTION    . COLONOSCOPY  06/29/2012   Procedure: COLONOSCOPY;  Surgeon: Ladene Artist, MD,FACG;  Location: [facility];  Service: Endoscopy;  Laterality: N/A;  . SHOULDER SURGERY  2010   Left  . TONSILLECTOMY  1964    Social History   Social History  . Marital status: Single    Spouse name: N/A  . Number of children: 0  . 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 Smoking status: Never Smoker  . Smokeless tobacco: Never Used  . Alcohol use No  . Drug use: No  . Sexual activity: Not Asked   Other Topics Concern  . None   Social History Narrative   Lives alone.          Family History  Problem Relation Age of Onset  . Hypertension Mother   . Heart disease Mother   . Heart attack Mother   . Diabetes Father   . Heart disease Father   . Skin cancer Father   . Cancer Father     skin  . Heart attack Father   . Hyperlipidemia Father   . Colon polyps Father   . Hypertension Brother   . Skin cancer Paternal Grandfather   . Colon cancer Paternal Grandfather 61  . Breast cancer Paternal Aunt   . Cancer Paternal Aunt     breast  . Diabetes Paternal Uncle   . Diabetes Maternal Grandfather   . Sudden death Neg Hx     Review of Systems  Constitutional: Negative for fever.  Cardiovascular: Negative for leg swelling.  Musculoskeletal: Positive for arthralgias, back pain (chronic, mild) and gait problem.  Skin: Positive for color change (itching/rash left lower leg after fall). Negative for rash.  Neurological: Positive for  weakness (legs). Negative for numbness.  Psychiatric/Behavioral: Positive for dysphoric mood. Negative for suicidal ideas.       Objective:   Vitals:   01/27/17 0953  BP: 122/76  Pulse: (!) 120   Filed Weights   01/27/17 0953  Weight: (!) 309 lb 3.2 oz (140.3 kg)   Body mass index is 53.07 kg/m.  Wt Readings from Last 3 Encounters:  01/27/17 (!) 309 lb 3.2 oz (140.3 kg)  10/28/16 (!) 314 lb (142.4 kg)  06/02/16 (!) 309 lb (140.2 kg)     Physical Exam  Constitutional: She is oriented to person, place, and time. She appears well-developed and well-nourished. No distress.  HENT:  Head: Normocephalic and atraumatic.  Musculoskeletal: She exhibits edema (left > right leg - mild in nature).  Legs non tender to palpation  Neurological: She is alert and oriented to person, place, and time.  Normal sensation b/l lower extremity  Skin: She is not diaphoretic. There is erythema (on left lower leg with excoriations from fall and from itching).  Psychiatric:  Depressed mood and affect          Assessment & Plan:   See Problem List for Assessment and Plan of chronic medical problems.

## 2017-01-29 MED FILL — GABAPENTIN 300 MG CAPSULE: 300 | 30 days supply | Qty: 60 | Fill #6

## 2017-01-29 MED FILL — FUROSEMIDE 80 MG TABLET: 80 | 90 days supply | Qty: 180 | Fill #1

## 2017-02-03 MED FILL — traMADol HCL 50 MG TABS: 50 | 30 days supply | Qty: 60 | Fill #0

## 2017-02-03 MED FILL — DULoxetine HCL 20 MG CPEP: 20 | 90 days supply | Qty: 90 | Fill #0

## 2017-02-18 DIAGNOSIS — I10 Essential (primary) hypertension: Secondary | ICD-10-CM | POA: Diagnosis not present

## 2017-02-18 DIAGNOSIS — E1165 Type 2 diabetes mellitus with hyperglycemia: Secondary | ICD-10-CM | POA: Diagnosis not present

## 2017-02-18 DIAGNOSIS — E039 Hypothyroidism, unspecified: Secondary | ICD-10-CM | POA: Diagnosis not present

## 2017-02-18 DIAGNOSIS — E7800 Pure hypercholesterolemia, unspecified: Secondary | ICD-10-CM | POA: Diagnosis not present

## 2017-02-18 DIAGNOSIS — R809 Proteinuria, unspecified: Secondary | ICD-10-CM | POA: Diagnosis not present

## 2017-02-18 LAB — BASIC METABOLIC PANEL
BUN: 37 — AB (ref 4–21)
Creatinine: 2.5 — AB (ref 0.5–1.1)
SODIUM: 140 (ref 137–147)

## 2017-02-18 LAB — HEPATIC FUNCTION PANEL
ALT: 14 (ref 7–35)
AST: 20 (ref 13–35)
Alkaline Phosphatase: 77 (ref 25–125)

## 2017-02-18 LAB — LIPID PANEL
Cholesterol: 193 (ref 0–200)
HDL: 30 — AB (ref 35–70)
LDL CALC: 88
Triglycerides: 377 — AB (ref 40–160)

## 2017-02-18 LAB — VITAMIN B12: VITAMIN B 12: 274

## 2017-02-19 ENCOUNTER — Encounter: Payer: Self-pay | Admitting: Internal Medicine

## 2017-02-20 MED FILL — LEVOTHYROXINE 88 MCG TABLET: 88 | 90 days supply | Qty: 90 | Fill #0

## 2017-02-27 MED FILL — GABAPENTIN 300 MG CAPSULE: 300 | 90 days supply | Qty: 270 | Fill #0

## 2017-04-01 ENCOUNTER — Ambulatory Visit: Payer: PPO | Admitting: Internal Medicine

## 2017-05-06 NOTE — Progress Notes (Signed)
Subjective:    Patient ID: Alice Kemp, female    DOB: May 19, 1951, 66 y.o.   MRN: [id number]  HPI The patient is here for follow up.  She only wants to sleep.  She can get up at 10 am and can go back to bed at 2pm.  She spent all day in bed last Saturday, except to go to the bathroom.  She is not working - she feels like she does not have a purpose.  She does feel hopeless.  She is unmotivated.  Her father has alzheimer's and she feels like she is grieving for him.    Depression: She is taking her medication daily as prescribed. She denies any side effects from the medication. She is unsure if she is depressed.  Chronic diastolic heart failure: She is taking all of her medications daily as prescribed. She has shortness of breath medication, which is not new. She states her leg swelling is well controlled and denies any swelling currently.  Lumbar radiculopathy with leg pain, weakness:  She is taking the gabapentin.  She uses a walker when her pain is severe.  She has not wanted to try PT or see orthopedics.  She only takes celebrex if she absolutely needs to. Overall she feels her pain is controlled.  Hypothyroidism, diabetes: She is up-to-date with her endocrinologist. Her thyroid medication was recently increased.  Chronic kidney disease: She is following with a nephrologist in her kidney function has been stable.  Gout: She is taking allopurinol as prescribed. She denies any gout symptoms.  Medications and allergies reviewed with patient and updated if appropriate.  Patient Active Problem List   Diagnosis Date Noted  . Pain in both lower extremities 01/27/2017  . Lumbar radiculopathy 06/02/2016  . Leg weakness, bilateral 06/02/2016  . Gout 11/29/2015  . Toe pain, left 07/25/2015  . Palpitations   . Chronic diastolic heart failure ([place]) 09/23/2013  . CKD (chronic kidney disease), stage III   . Depression 02/11/2013  . Benign neoplasm of colon 06/29/2012  . Stasis  dermatitis 01/04/2012  . Cervical radiculopathy at C6 07/24/2011  . Morbid obesity ([place]) 02/14/2011  . Hypothyroidism 02/14/2011  . OBSTRUCTIVE SLEEP APNEA 12/23/2010  . PERICARDIAL EFFUSION 12/12/2010  . Uncontrolled type 2 diabetes mellitus with stage 3 chronic kidney disease ([place]) 12/02/2010  . EDEMA 12/02/2010  . DYSPNEA 12/02/2010    Current Outpatient Prescriptions on File Prior to Visit  Medication Sig Dispense Refill  . allopurinol (ZYLOPRIM) 100 MG tablet Take 1 tablet (100 mg total) by mouth daily. Yearly physical w/labs due in August must see MD for refills 90 tablet 0  . Alpha-Lipoic Acid 100 MG CAPS Take by mouth.    Mar[place] Kitchen amitriptyline (ELAVIL) 10 MG tablet   5  . aspirin 81 MG tablet Take 81 mg by mouth daily. Reported on 12/24/2015    . celecoxib (CELEBREX) 100 MG capsule Take 1 capsule (100 mg total) by mouth daily as needed. Yearly physical w/labs due in August must see MD for future refills 90 capsule 0  . COLCRYS 0.6 MG tablet Take 2 tablets by mouth at the onset of flair and 1 tablet by mouth 1 hour later. 15 tablet 2  . DULoxetine (CYMBALTA) 20 MG capsule Take 1 capsule (20 mg total) by mouth daily. 90 capsule 1  . fluticasone (CUTIVATE) 0.05 % cream APPLY 3 TIMES A DAY AS NEEDED FOR ITCHING 60 g 1  . furosemide (LASIX) 80 MG tablet Take 1 tablet (  80 mg total) by mouth daily. 90 tablet 3  . gabapentin (NEURONTIN) 300 MG capsule     . levocetirizine (XYZAL) 5 MG tablet Take 1 tablet (5 mg total) by mouth every evening. Yearly physical w/labs due in august must see MD for refills 90 tablet 1  . losartan (COZAAR) 25 MG tablet TAKE 1 TABLET BY MOUTH ONCE DAILY 90 tablet 3  . NOVOLOG MIX 70/30 FLEXPEN (70-30) 100 UNIT/ML FlexPen Inject 85 units with breakfast, 60 units with lunch and 85 units with dinner. 225 mL 3  . ONETOUCH VERIO test strip   3  . simvastatin (ZOCOR) 40 MG tablet Take 1 tablet (40 mg total) by mouth daily at 6 PM. 30 tablet 3  . spironolactone (ALDACTONE) 25  MG tablet Take 1 tablet (25 mg total) by mouth daily. 90 tablet 1  . Vitamin D, Ergocalciferol, (DRISDOL) 50000 UNITS CAPS capsule   1   No current facility-administered medications on file prior to visit.     Past Medical History:  Diagnosis Date  . Anxiety   . Arthritis   . Benign neoplasm of colon 06/29/2012  . Breast abscess 07/2011   left; s/p I&D + abx by gen surg  . Cervical radiculopathy at C6   . CKD (chronic kidney disease) stage 3, GFR 30-59 ml/min    follows with renal  . Depression   . Diabetes mellitus type II   . Diastolic heart failure ([place]) 09/2013 echo  . Hyperlipidemia   . Morbid obesity ([place])   . OBSTRUCTIVE SLEEP APNEA 12/23/2010   npsg 2012:  AHI 25/hr, severe desat to 68%.   autoset 2013: optimal pressure 13cm   . PERICARDIAL EFFUSION 12/12/2010  . Pulmonary hypertension ([place])   . Skin lesion of breast 07/09/2012  . Unspecified hypothyroidism   . Vitamin B12 deficiency     Past Surgical History:  Procedure Laterality Date  . BREAST BIOPSY  1990   Left  . CATARACT EXTRACTION    . COLONOSCOPY  06/29/2012   Procedure: COLONOSCOPY;  Surgeon: Ladene Artist, MD,FACG;  Location: [facility];  Service: Endoscopy;  Laterality: N/A;  . SHOULDER SURGERY  2010   Left  . TONSILLECTOMY  1964    Social History   Social History  . Marital status: Single    Spouse name: N/A  . Number of children: 0  . 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 Smoking status: Never Smoker  . Smokeless tobacco: Never Used  . Alcohol use No  . Drug use: No  . Sexual activity: Not on file   Other Topics Concern  . Not on file   Social History Narrative   Lives alone.          Family History  Problem Relation Age of Onset  . Hypertension Mother   . Heart disease Mother   . Heart attack Mother   . Diabetes Father   . Heart disease Father   . Skin cancer  Father   . Cancer Father        skin  . Heart attack Father   . Hyperlipidemia Father   . Colon polyps Father   . Hypertension Brother   . Skin cancer Paternal Grandfather   . Colon cancer Paternal Grandfather 22  . Breast cancer Paternal Aunt   . Cancer Paternal Aunt  breast  . Diabetes Paternal Uncle   . Diabetes Maternal Grandfather   . Sudden death Neg Hx     Review of Systems  Constitutional: Negative for chills and fever.  Respiratory: Positive for shortness of breath. Negative for cough and wheezing.   Cardiovascular: Positive for leg swelling (controlled). Negative for chest pain and palpitations.  Neurological: Positive for headaches. Negative for light-headedness.  Psychiatric/Behavioral: Positive for dysphoric mood.       Objective:   Vitals:   05/07/17 1111  BP: (!) 172/94  Pulse: 99  Resp: 20  Temp: 98.1 F (36.7 C)   Wt Readings from Last 3 Encounters:  05/07/17 299 lb (135.6 kg)  01/27/17 (!) 309 lb 3.2 oz (140.3 kg)  10/28/16 (!) 314 lb (142.4 kg)   Body mass index is 51.32 kg/m.   Physical Exam    Constitutional: Morbidly obese. No distress.  HENT:  Head: Normocephalic and atraumatic.  Neck: Neck supple. No tracheal deviation present. No thyromegaly present.  No cervical lymphadenopathy Cardiovascular: Normal rate, regular rhythm and normal heart sounds.   No murmur heard. No carotid bruit .  No edema Pulmonary/Chest: Effort normal and breath sounds normal. No respiratory distress. No has no wheezes. No rales.  Skin: Skin is warm and dry. Not diaphoretic.  Psychiatric: Depressed mood and affect. Behavior is normal.      Assessment & Plan:    See Problem List for Assessment and Plan of chronic medical problems.

## 2017-05-07 ENCOUNTER — Encounter: Payer: Self-pay | Admitting: Internal Medicine

## 2017-05-07 ENCOUNTER — Ambulatory Visit (INDEPENDENT_AMBULATORY_CARE_PROVIDER_SITE_OTHER): Payer: PPO | Admitting: Internal Medicine

## 2017-05-07 VITALS — BP 172/94 | HR 99 | Temp 98.1°F | Resp 20 | Wt 299.0 lb

## 2017-05-07 DIAGNOSIS — Z794 Long term (current) use of insulin: Secondary | ICD-10-CM | POA: Diagnosis not present

## 2017-05-07 DIAGNOSIS — E039 Hypothyroidism, unspecified: Secondary | ICD-10-CM | POA: Diagnosis not present

## 2017-05-07 DIAGNOSIS — N183 Chronic kidney disease, stage 3 (moderate): Secondary | ICD-10-CM

## 2017-05-07 DIAGNOSIS — IMO0002 Reserved for concepts with insufficient information to code with codable children: Secondary | ICD-10-CM

## 2017-05-07 DIAGNOSIS — M5416 Radiculopathy, lumbar region: Secondary | ICD-10-CM

## 2017-05-07 DIAGNOSIS — E1122 Type 2 diabetes mellitus with diabetic chronic kidney disease: Secondary | ICD-10-CM

## 2017-05-07 DIAGNOSIS — I5032 Chronic diastolic (congestive) heart failure: Secondary | ICD-10-CM

## 2017-05-07 DIAGNOSIS — F3289 Other specified depressive episodes: Secondary | ICD-10-CM

## 2017-05-07 DIAGNOSIS — E1165 Type 2 diabetes mellitus with hyperglycemia: Secondary | ICD-10-CM

## 2017-05-07 DIAGNOSIS — M109 Gout, unspecified: Secondary | ICD-10-CM | POA: Diagnosis not present

## 2017-05-07 MED ORDER — ALLOPURINOL 100 MG PO TABS: 100.0000 mg | ORAL_TABLET | Freq: Every day | ORAL | 3 refills | Status: DC

## 2017-05-07 MED ORDER — DULOXETINE HCL 20 MG PO CPEP: 40.0000 mg | ORAL_CAPSULE | Freq: Every day | ORAL | 1 refills | Status: DC

## 2017-05-07 MED FILL — ALLOPURINOL 100 MG TABLET: 100 | 90 days supply | Qty: 90 | Fill #0

## 2017-05-07 NOTE — Assessment & Plan Note (Signed)
Overall pain is controlled Continue gabapentin  she takes Celebrex only as neded

## 2017-05-07 NOTE — Patient Instructions (Addendum)
   Medications reviewed and updated.  Changes include increasing cymbalta to 40 mg daily.  Your prescription(s) have been submitted to your pharmacy. Please take as directed and contact our office if you believe you are having problem(s) with the medication(s).  A referral was ordered for weight management/metabolic clinic.  Please followup in 6 months

## 2017-05-07 NOTE — Assessment & Plan Note (Signed)
Not using   cpap 

## 2017-05-07 NOTE — Assessment & Plan Note (Signed)
Not exercising  she does try to eat healthy and tries to watch her potions   has been unsuccessful and losing weight   we'll refer to the metabolic clinc

## 2017-05-07 NOTE — Assessment & Plan Note (Addendum)
No recurrent gout Continue allopuinol at current dose

## 2017-05-07 NOTE — Assessment & Plan Note (Signed)
Not controlled. Her depression stems from not working and feeling like she has a purpose. Her father also has Alzheimer's and she feels like she is grieving for him. Her weight is likely contributing as well Taking Cymbalta 20 mg daily-we'll increase to 40 mg daily Discussed that she could see a psychiatrist or therapist

## 2017-05-07 NOTE — Assessment & Plan Note (Signed)
Management per endocrine 

## 2017-05-07 NOTE — Assessment & Plan Note (Signed)
Medication just increase by endocrine

## 2017-05-07 NOTE — Assessment & Plan Note (Signed)
Euvolemic on exam Diuretics managed by nephrology Continue current medications

## 2017-05-11 ENCOUNTER — Telehealth: Payer: Self-pay | Admitting: Internal Medicine

## 2017-05-11 NOTE — Telephone Encounter (Signed)
Pt called in said that she was suppose to have a referral to Dr Leslie Andrea for weight management.  I didn't see it in there.  Should she have this referral ?

## 2017-05-11 NOTE — Telephone Encounter (Signed)
It was ordered.

## 2017-05-13 ENCOUNTER — Other Ambulatory Visit: Payer: Self-pay | Admitting: Internal Medicine

## 2017-05-13 MED FILL — COLCHICINE 0.6 MG TABLET: 0.6 | 5 days supply | Qty: 15 | Fill #1

## 2017-05-13 MED FILL — VIT D2 1.25 MG (50,000 UNIT: 1.25 MG | 84 days supply | Qty: 12 | Fill #1

## 2017-05-13 MED FILL — LEVOTHYROXINE 88 MCG TABLET: 88 | 90 days supply | Qty: 90 | Fill #1

## 2017-05-13 MED FILL — FUROSEMIDE 80 MG TABLET: 80 | 90 days supply | Qty: 180 | Fill #2

## 2017-05-13 MED FILL — LOSARTAN POTASSIUM 25 MG TA: 25 | 90 days supply | Qty: 90 | Fill #2

## 2017-05-13 MED FILL — SPIRONOLACTONE 25 MG TABLET: 25 | 90 days supply | Qty: 90 | Fill #0

## 2017-05-13 MED FILL — LEVOCETIRIZINE 5 MG TABLET: 5 | 90 days supply | Qty: 90 | Fill #0

## 2017-05-13 NOTE — Telephone Encounter (Signed)
LVM informing referral was entered and she is currently wait listed for the clinic.

## 2017-05-19 ENCOUNTER — Encounter: Payer: Self-pay | Admitting: Internal Medicine

## 2017-05-20 MED FILL — ONETOUCH VERIO TEST STRIP: 30 days supply | Qty: 200 | Fill #0

## 2017-05-25 MED FILL — DULoxetine HCL 20 MG CPEP: 20 | 90 days supply | Qty: 180 | Fill #0

## 2017-05-25 MED FILL — AMITRIPTYLINE HCL 10 MG TAB: 10 | 90 days supply | Qty: 90 | Fill #1

## 2017-06-02 MED FILL — NOVOLOG MIX 70-30 FLEXPEN S: (70-30) 100 | 30 days supply | Qty: 66 | Fill #0

## 2017-06-30 ENCOUNTER — Ambulatory Visit (INDEPENDENT_AMBULATORY_CARE_PROVIDER_SITE_OTHER): Payer: PPO | Admitting: General Practice

## 2017-06-30 DIAGNOSIS — Z23 Encounter for immunization: Secondary | ICD-10-CM

## 2017-07-20 DIAGNOSIS — N2581 Secondary hyperparathyroidism of renal origin: Secondary | ICD-10-CM | POA: Diagnosis not present

## 2017-07-20 DIAGNOSIS — N189 Chronic kidney disease, unspecified: Secondary | ICD-10-CM | POA: Diagnosis not present

## 2017-07-20 DIAGNOSIS — N184 Chronic kidney disease, stage 4 (severe): Secondary | ICD-10-CM | POA: Diagnosis not present

## 2017-07-21 ENCOUNTER — Encounter: Payer: Self-pay | Admitting: Gastroenterology

## 2017-07-21 ENCOUNTER — Encounter (INDEPENDENT_AMBULATORY_CARE_PROVIDER_SITE_OTHER): Payer: Medicare Other

## 2017-07-24 DIAGNOSIS — N2581 Secondary hyperparathyroidism of renal origin: Secondary | ICD-10-CM | POA: Diagnosis not present

## 2017-07-24 DIAGNOSIS — N184 Chronic kidney disease, stage 4 (severe): Secondary | ICD-10-CM | POA: Diagnosis not present

## 2017-07-24 DIAGNOSIS — D631 Anemia in chronic kidney disease: Secondary | ICD-10-CM | POA: Diagnosis not present

## 2017-07-24 DIAGNOSIS — I10 Essential (primary) hypertension: Secondary | ICD-10-CM | POA: Diagnosis not present

## 2017-07-29 ENCOUNTER — Encounter (INDEPENDENT_AMBULATORY_CARE_PROVIDER_SITE_OTHER): Payer: Self-pay | Admitting: Family Medicine

## 2017-07-29 ENCOUNTER — Ambulatory Visit (INDEPENDENT_AMBULATORY_CARE_PROVIDER_SITE_OTHER): Payer: PPO | Admitting: Family Medicine

## 2017-07-29 VITALS — BP 126/81 | HR 77 | Temp 97.7°F | Ht 63.0 in | Wt 300.0 lb

## 2017-07-29 DIAGNOSIS — R5383 Other fatigue: Secondary | ICD-10-CM

## 2017-07-29 DIAGNOSIS — E538 Deficiency of other specified B group vitamins: Secondary | ICD-10-CM

## 2017-07-29 DIAGNOSIS — Z6841 Body Mass Index (BMI) 40.0 and over, adult: Secondary | ICD-10-CM

## 2017-07-29 DIAGNOSIS — E119 Type 2 diabetes mellitus without complications: Secondary | ICD-10-CM

## 2017-07-29 DIAGNOSIS — E7849 Other hyperlipidemia: Secondary | ICD-10-CM

## 2017-07-29 DIAGNOSIS — E038 Other specified hypothyroidism: Secondary | ICD-10-CM | POA: Diagnosis not present

## 2017-07-29 DIAGNOSIS — Z1331 Encounter for screening for depression: Secondary | ICD-10-CM | POA: Diagnosis not present

## 2017-07-29 DIAGNOSIS — Z0289 Encounter for other administrative examinations: Secondary | ICD-10-CM

## 2017-07-29 DIAGNOSIS — E559 Vitamin D deficiency, unspecified: Secondary | ICD-10-CM | POA: Diagnosis not present

## 2017-07-29 DIAGNOSIS — N183 Chronic kidney disease, stage 3 unspecified: Secondary | ICD-10-CM

## 2017-07-29 DIAGNOSIS — Z794 Long term (current) use of insulin: Secondary | ICD-10-CM

## 2017-07-29 DIAGNOSIS — R0602 Shortness of breath: Secondary | ICD-10-CM

## 2017-07-29 DIAGNOSIS — E1122 Type 2 diabetes mellitus with diabetic chronic kidney disease: Secondary | ICD-10-CM

## 2017-07-29 NOTE — Progress Notes (Signed)
Office: [phone]  /  Fax: [phone]   Dear Dr. Quay Burow,   Thank you for referring Alice Kemp to our clinic. The following note includes my evaluation and treatment recommendations.  HPI:   Chief Complaint: OBESITY    Alice Kemp has been referred by [name], MD for consultation regarding her obesity and obesity related comorbidities.    Alice Kemp (MR# [id number]) is a 66 y.o. female who presents on 07/29/2017 for obesity evaluation and treatment. Current BMI is Body mass index is 53.14 kg/m.Marland Kitchen Alice Kemp has been struggling with her weight for many years and has been unsuccessful in either losing weight, maintaining weight loss, or reaching her healthy weight goal.     Alice Kemp attended our information session and states she is currently in the action stage of change and ready to dedicate time achieving and maintaining a healthier weight. Alice Kemp is interested in becoming our patient and working on intensive lifestyle modifications including (but not limited to) diet, exercise and weight loss.    Alice Kemp states her family eats meals together her desired weight loss is 151 lbs she has been heavy most of  her life she started gaining weight in 2010-2013 her heaviest weight ever was 309 lbs. she has significant food cravings issues  she snacks frequently in the evenings she skips meals frequently she is frequently drinking liquids with calories she frequently eats larger portions than normal  she struggles with emotional eating    Fatigue Alice Kemp feels her energy is lower than it should be. This has worsened with weight gain and has not worsened recently. [name] admits to daytime somnolence and  admits to waking up still tired. Patient is at risk for obstructive sleep apnea. Patent has a history of symptoms of daytime fatigue and morning fatigue. Patient generally gets 9 hours of sleep per night, and states they generally have restful sleep. Snoring is present. Apneic  episodes are present. Epworth Sleepiness Score is 7  Dyspnea on exertion Alice Kemp notes increasing shortness of breath with exercising and seems to be worsening over time with weight gain. She notes getting out of breath sooner with activity than she used to. This has not gotten worse recently. [name] denies orthopnea.  Vitamin D deficiency Alice Kemp has a diagnosis of vitamin D deficiency. She is currently taking prescription vit D 50,000 IU weekly and admits fatigue but denies nausea, vomiting or muscle weakness.  Vitamin B12 Deficiency [name] has a diagnosis of B12 insufficiency. Alice Kemp is currently taking 500 mcg vitamin B12 daily and she admits fatigue. This is not a new diagnosis. Alice Kemp is not a vegetarian and does not have a previous diagnosis of pernicious anemia. She does not have a history of weight loss surgery.   Hypothyroid Alice Kemp has a diagnosis of hypothyroidism. She is on synthroid 88 mcg daily. She denies tremors, but does admit to ongoing fatigue and heat intolerance.  Hyperlipidemia Alice Kemp has hyperlipidemia and is currently on simvastatin 40 mg daily. She is attempting to improve her cholesterol levels with intensive lifestyle modification including a low saturated fat diet, exercise and weight loss. She denies any chest pain, claudication or myalgias.  Diabetes II Alice Kemp has a diagnosis of diabetes type II and is currently on 70/30 novolog, 75/75/55 units. [name] states she is not checking BGs at home as often and describes a history of highs and lows on 70/30 due to money issues. She is attempting to work on intensive lifestyle modifications including diet, exercise, and weight loss to  help control her blood glucose levels.   Depression Screen Alice Kemp's Food and Mood (modified PHQ-9) score was  Depression screen PHQ 2/9 07/29/2017  Decreased Interest 3  Down, Depressed, Hopeless 3  PHQ - 2 Score 6  Altered sleeping 1  Tired, decreased energy 3  Change in appetite 2    Feeling bad or failure about yourself  3  Trouble concentrating 1  Moving slowly or fidgety/restless 3  Suicidal thoughts 3  PHQ-9 Score 22  Difficult doing work/chores Extremely dIfficult    ALLERGIES: Allergies  Allergen Reactions  . Sulfa Antibiotics   . Penicillins Rash    MEDICATIONS: Current Outpatient Prescriptions on File Prior to Visit  Medication Sig Dispense Refill  . allopurinol (ZYLOPRIM) 100 MG tablet Take 1 tablet (100 mg total) by mouth daily. 90 tablet 3  . Alpha-Lipoic Acid 100 MG CAPS Take by mouth.    Marland Kitchen amitriptyline (ELAVIL) 10 MG tablet   5  . aspirin 81 MG tablet Take 81 mg by mouth daily. Reported on 12/24/2015    . celecoxib (CELEBREX) 100 MG capsule Take 1 capsule (100 mg total) by mouth daily as needed. Yearly physical w/labs due in August must see MD for future refills 90 capsule 0  . COLCRYS 0.6 MG tablet Take 2 tablets by mouth at the onset of flair and 1 tablet by mouth 1 hour later. 15 tablet 2  . DULoxetine (CYMBALTA) 20 MG capsule Take 2 capsules (40 mg total) by mouth daily. 180 capsule 1  . fluticasone (CUTIVATE) 0.05 % cream APPLY 3 TIMES A DAY AS NEEDED FOR ITCHING 60 g 1  . furosemide (LASIX) 80 MG tablet Take 1 tablet (80 mg total) by mouth daily. 90 tablet 3  . gabapentin (NEURONTIN) 300 MG capsule     . levocetirizine (XYZAL) 5 MG tablet TAKE 1 TABLET BY MOUTH EVERY EVENING 90 tablet 3  . levothyroxine (SYNTHROID, LEVOTHROID) 88 MCG tablet Take 88 mcg by mouth daily before breakfast.    . losartan (COZAAR) 25 MG tablet TAKE 1 TABLET BY MOUTH ONCE DAILY 90 tablet 3  . NOVOLOG MIX 70/30 FLEXPEN (70-30) 100 UNIT/ML FlexPen Inject 85 units with breakfast, 60 units with lunch and 85 units with dinner. 225 mL 3  . ONETOUCH VERIO test strip   3  . spironolactone (ALDACTONE) 25 MG tablet TAKE 1 TABLET (25 MG TOTAL) BY MOUTH DAILY. 90 tablet 3  . Vitamin D, Ergocalciferol, (DRISDOL) 50000 UNITS CAPS capsule   1   No current facility-administered  medications on file prior to visit.     PAST MEDICAL HISTORY: Past Medical History:  Diagnosis Date  . Anxiety   . Arthritis   . Back pain   . Benign neoplasm of colon 06/29/2012  . Breast abscess 07/2011   left; s/p I&D + abx by gen surg  . Cervical radiculopathy at C6   . CKD (chronic kidney disease) stage 3, GFR 30-59 ml/min (HCC)    follows with renal  . Constipation   . Depression   . Diabetes mellitus type II   . Diastolic heart failure ([place]) 09/2013 echo  . Gall bladder inflammation   . Glaucoma   . Gout   . Hyperlipidemia   . Hypothyroidism   . Joint pain   . Kidney disease   . Leg pain   . Morbid obesity ([place])   . OBSTRUCTIVE SLEEP APNEA 12/23/2010   npsg 2012:  AHI 25/hr, severe desat to 68%.   autoset 2013: optimal  pressure 13cm   . PERICARDIAL EFFUSION 12/12/2010  . Pulmonary hypertension ([place])   . Shortness of breath   . Skin lesion of breast 07/09/2012  . Swelling of extremity   . Unspecified hypothyroidism   . Vitamin B12 deficiency   . Vitamin D deficiency     PAST SURGICAL HISTORY: Past Surgical History:  Procedure Laterality Date  . BREAST BIOPSY  1990   Left  . CATARACT EXTRACTION    . COLONOSCOPY  06/29/2012   Procedure: COLONOSCOPY;  Surgeon: Ladene Artist, MD,FACG;  Location: [facility];  Service: Endoscopy;  Laterality: N/A;  . SHOULDER SURGERY  2010   Left  . TONSILLECTOMY  1964    SOCIAL HISTORY: Social History  Substance Use Topics  . Smoking status: Never Smoker  . Smokeless tobacco: Never Used  . Alcohol use No    FAMILY HISTORY: Family History  Problem Relation Age of Onset  . Hypertension Mother   . Heart disease Mother   . Heart attack Mother   . Thyroid disease Mother   . Diabetes Father   . Heart disease Father   . Skin cancer Father   . Cancer Father        skin  . Heart attack Father   . Hyperlipidemia Father   . Colon polyps Father   . Depression Father   . Hypertension Brother   . Skin cancer Paternal  Grandfather   . Colon cancer Paternal Grandfather 66  . Breast cancer Paternal Aunt   . Cancer Paternal Aunt        breast  . Diabetes Paternal Uncle   . Diabetes Maternal Grandfather   . Sudden death Neg Hx     ROS: Review of Systems  Constitutional: Positive for malaise/fatigue.  HENT: Positive for congestion (nasal stuffiness).        Nasal Discharge Hoarseness  Eyes:       Vision Changes Blurry or Double Vision  Respiratory: Positive for cough and shortness of breath.   Cardiovascular: Negative for chest pain, orthopnea and claudication.       Shortness of Breath on exertion Leg Cramping Very Cold Feet or Hands  Gastrointestinal: Negative for nausea and vomiting.  Musculoskeletal: Positive for back pain. Negative for myalgias.       Neck Stiffness Muscle or Joint Pain Muscle Stiffness Negative muscle weakness  Skin: Positive for itching.       Hair or Nail Changes  Neurological: Positive for weakness. Negative for tremors.  Endo/Heme/Allergies: Positive for polydipsia. Bruises/bleeds easily (Easy Bruising).       Heat Intolerance Excessive Hunger Positive hypoglycemia  Psychiatric/Behavioral: Positive for depression. The patient has insomnia.     PHYSICAL EXAM: Blood pressure 126/81, pulse 77, temperature 97.7 F (36.5 C), temperature source Oral, height '5\' 3"'$  (1.6 m), weight 300 lb (136.1 kg), SpO2 98 %. Body mass index is 53.14 kg/m. Physical Exam  Constitutional: She is oriented to person, place, and time. She appears well-developed and well-nourished.  Cardiovascular: Normal rate.   Pulmonary/Chest: Effort normal.  Musculoskeletal: Normal range of motion. She exhibits edema (1+ trace edema bilateral lower extremities).  Neurological: She is oriented to person, place, and time.  Skin: Skin is warm and dry.  Psychiatric: She has a normal mood and affect. Her behavior is normal.  Vitals reviewed.   RECENT LABS AND TESTS: BMET    Component Value  Date/Time   NA 140 02/18/2017   K 3.9 01/08/2016 1649   CL 98 01/08/2016 1649  CO2 31 01/08/2016 1649   GLUCOSE 130 (H) 01/08/2016 1649   BUN 37 (A) 02/18/2017   CREATININE 2.5 (A) 02/18/2017   CREATININE 2.35 (H) 01/08/2016 1649   CALCIUM 9.6 01/08/2016 1649   GFRNONAA 55 (L) 01/29/2011 0500   GFRAA  01/29/2011 0500    >60        The eGFR has been calculated using the MDRD equation. This calculation has not been validated in all clinical situations. eGFR's persistently <60 mL/min signify possible Chronic Kidney Disease.   Lab Results  Component Value Date   HGBA1C 8.1 (H) 04/10/2015   No results found for: INSULIN CBC    Component Value Date/Time   WBC 9.0 12/15/2016   WBC 8.9 09/23/2013 1149   RBC 4.58 09/23/2013 1149   HGB 12.6 12/15/2016   HCT 39 12/15/2016   PLT 213 12/15/2016   MCV 84.1 09/23/2013 1149   MCH 28.1 01/29/2011 0500   MCHC 33.0 09/23/2013 1149   RDW 15.6 (H) 09/23/2013 1149   LYMPHSABS 2.7 09/23/2013 1149   MONOABS 0.6 09/23/2013 1149   EOSABS 0.2 09/23/2013 1149   BASOSABS 0.0 09/23/2013 1149   Iron/TIBC/Ferritin/ %Sat No results found for: IRON, TIBC, FERRITIN, IRONPCTSAT Lipid Panel     Component Value Date/Time   CHOL 193 02/18/2017   TRIG 377 (A) 02/18/2017   HDL 30 (A) 02/18/2017   CHOLHDL 4 04/10/2015 1212   VLDL 38.8 04/10/2015 1212   LDLCALC 88 02/18/2017   LDLDIRECT 54.6 05/10/2012 1440   Hepatic Function Panel     Component Value Date/Time   PROT 7.6 12/28/2015 1038   ALBUMIN 3.6 12/28/2015 1038   AST 20 02/18/2017   ALT 14 02/18/2017   ALKPHOS 77 02/18/2017   BILITOT 0.4 12/28/2015 1038   BILIDIR 0.1 05/10/2012 1440      Component Value Date/Time   TSH 1.62 04/10/2015 1212   TSH 1.65 09/23/2013 1149   TSH 3.84 01/26/2013   TSH 2.55 08/25/2012   TSH 1.37 05/10/2012 1440    ECG  shows NSR with a rate of 80 BPM INDIRECT CALORIMETER done today shows a VO2 of 284 and a REE of 1975.  Her basal metabolic rate is  as expected.    ASSESSMENT AND PLAN: Other fatigue - Plan: EKG 12-Lead, CBC With Differential  Shortness of breath on exertion  Type 2 diabetes mellitus without complication, with long-term current use of insulin (HCC) - Plan: Comprehensive metabolic panel, Hemoglobin A1c, Insulin, random, Microalbumin / creatinine urine ratio  Vitamin D deficiency - Plan: VITAMIN D 25 Hydroxy (Vit-D Deficiency, Fractures)  B12 nutritional deficiency - Plan: Vitamin B12  Other hyperlipidemia - Plan: Lipid Panel With LDL/HDL Ratio  Other specified hypothyroidism - Plan: T3, T4, free, TSH  Depression screening  Class 3 severe obesity with serious comorbidity and body mass index (BMI) of 50.0 to 59.9 in adult, unspecified obesity type (HCC)  PLAN: Fatigue Monie was informed that her fatigue may be related to obesity, depression or many other causes. Labs will be ordered, and in the meanwhile Alayshia has agreed to work on diet, exercise and weight loss to help with fatigue. Proper sleep hygiene was discussed including the need for 7-8 hours of quality sleep each night. A sleep study was not ordered based on symptoms and Epworth score.  Dyspnea on exertion Shenaya's shortness of breath appears to be obesity related and exercise induced. She has agreed to work on weight loss and gradually increase exercise to treat her exercise induced  shortness of breath. If Maleny follows our instructions and loses weight without improvement of her shortness of breath, we will plan to refer to pulmonology. We will monitor this condition regularly. Boyd agrees to this plan.  Vitamin D Deficiency Alice Kemp was informed that low vitamin D levels contributes to fatigue and are associated with obesity, breast, and colon cancer. She agrees to continue to take prescription Vit D '@50'$ ,000 IU every week. We will check labs and will follow up for routine testing of vitamin D, at least 2-3 times per year. She was informed of the risk  of over-replacement of vitamin D and agrees to not increase her dose unless he discusses this with [name] first.  Vitamin B12 Deficiency Alice Kemp will work on increasing B12 rich foods in her diet. Alice Kemp will continue vitamin B12 for now and we will check labs and follow.  Hypothyroid Alice Kemp was informed of the importance of good thyroid control to help with weight loss efforts. She was also informed that supertheraputic thyroid levels are dangerous and will not improve weight loss results. We will check labs and follow.  Hyperlipidemia Alice Kemp was informed of the American Heart Association Guidelines emphasizing intensive lifestyle modifications as the first line treatment for hyperlipidemia. We discussed many lifestyle modifications today in depth, and Dayana will continue to work on decreasing saturated fats such as fatty red meat, butter and many fried foods. She will also increase vegetables and lean protein in her diet and continue to work on exercise and weight loss efforts. We will check labs and Alice Kemp will follow up as directed.  Diabetes II Alice Kemp has been given extensive diabetes education by myself today including ideal fasting and post-prandial blood glucose readings, individual ideal Hgb A1c goals  and hypoglycemia prevention. We discussed the importance of good blood sugar control to decrease the likelihood of diabetic complications such as nephropathy, neuropathy, limb loss, blindness, coronary artery disease, and death. Alice Kemp agrees to check her blood sugars 2 times daily. We discussed the importance of intensive lifestyle modification including diet, exercise and weight loss as the first line treatment for diabetes. Aitanna agrees to work on diet, exercise and weight loss. We will check labs and Breeanne agrees to continue her diabetes medications and will follow up at the agreed upon time.  Depression Screen Alice Kemp had a strongly positive depression screening. Depression is commonly  associated with obesity and often results in emotional eating behaviors. We will monitor this closely and work on CBT to help improve the non-hunger eating patterns. Referral to Psychology may be required if no improvement is seen as she continues in our clinic.  Obesity Arnetta is currently in the action stage of change and her goal is to continue with weight loss efforts. I recommend Luddie begin the structured treatment plan as follows:  She has agreed to follow the Category 3 plan Akirra has been instructed to eventually work up to a goal of 150 minutes of combined cardio and strengthening exercise per week for weight loss and overall health benefits. We discussed the following Behavioral Modification Strategies today: increasing lean protein intake, decreasing simple carbohydrates  and decrease eating out   She was informed of the importance of frequent follow up visits to maximize her success with intensive lifestyle modifications for her multiple health conditions. She was informed we would discuss her lab results at her next visit unless there is a critical issue that needs to be addressed sooner. Danayah agreed to keep her next visit at the agreed upon  time to discuss these results.  I, Doreene Nest, am acting as transcriptionist for  Dennard Nip, MD  I have reviewed the above documentation for accuracy and completeness, and I agree with the above. -Dennard Nip, MD     OBESITY BEHAVIORAL INTERVENTION VISIT  Today's visit was # 1 out of 16.  Starting weight: 300 lbs Starting date: 07/29/17 Today's weight : 300 lbs Today's date: 07/29/2017 Total lbs lost to date: 0 (Patients must lose 7 lbs in the first 6 months to continue with counseling)   ASK: We discussed the diagnosis of obesity with Ed Blalock today and Nyeshia agreed to give [name] permission to discuss obesity behavioral modification therapy today.  ASSESS: Dayonna has the diagnosis of obesity and her BMI today is  53.16 Shilo is in the action stage of change   ADVISE: Jaclynn was educated on the multiple health risks of obesity as well as the benefit of weight loss to improve her health. She was advised of the need for long term treatment and the importance of lifestyle modifications.  AGREE: Multiple dietary modification options and treatment options were discussed and  Pegi agreed to follow the Category 3 plan We discussed the following Behavioral Modification Strategies today: increasing lean protein intake, decreasing simple carbohydrates  and decrease eating out

## 2017-07-30 DIAGNOSIS — Z794 Long term (current) use of insulin: Secondary | ICD-10-CM | POA: Diagnosis not present

## 2017-07-30 DIAGNOSIS — R5383 Other fatigue: Secondary | ICD-10-CM | POA: Diagnosis not present

## 2017-07-30 DIAGNOSIS — E559 Vitamin D deficiency, unspecified: Secondary | ICD-10-CM | POA: Diagnosis not present

## 2017-07-30 DIAGNOSIS — E538 Deficiency of other specified B group vitamins: Secondary | ICD-10-CM | POA: Diagnosis not present

## 2017-07-30 DIAGNOSIS — E7849 Other hyperlipidemia: Secondary | ICD-10-CM | POA: Diagnosis not present

## 2017-07-30 DIAGNOSIS — E038 Other specified hypothyroidism: Secondary | ICD-10-CM | POA: Diagnosis not present

## 2017-07-30 DIAGNOSIS — E119 Type 2 diabetes mellitus without complications: Secondary | ICD-10-CM | POA: Diagnosis not present

## 2017-07-31 LAB — TSH: TSH: 2.61 u[IU]/mL (ref 0.450–4.500)

## 2017-07-31 LAB — CBC WITH DIFFERENTIAL
Basophils Absolute: 0 10*3/uL (ref 0.0–0.2)
Basos: 0 %
EOS (ABSOLUTE): 0.3 10*3/uL (ref 0.0–0.4)
EOS: 4 %
HEMATOCRIT: 40.3 % (ref 34.0–46.6)
HEMOGLOBIN: 13.2 g/dL (ref 11.1–15.9)
IMMATURE GRANULOCYTES: 0 %
Immature Grans (Abs): 0 10*3/uL (ref 0.0–0.1)
LYMPHS ABS: 4.1 10*3/uL — AB (ref 0.7–3.1)
Lymphs: 52 %
MCH: 30.9 pg (ref 26.6–33.0)
MCHC: 32.8 g/dL (ref 31.5–35.7)
MCV: 94 fL (ref 79–97)
MONOCYTES: 6 %
MONOS ABS: 0.5 10*3/uL (ref 0.1–0.9)
NEUTROS ABS: 3 10*3/uL (ref 1.4–7.0)
Neutrophils: 38 %
RBC: 4.27 x10E6/uL (ref 3.77–5.28)
RDW: 13.5 % (ref 12.3–15.4)
WBC: 7.9 10*3/uL (ref 3.4–10.8)

## 2017-07-31 LAB — LIPID PANEL WITH LDL/HDL RATIO
Cholesterol, Total: 250 mg/dL — ABNORMAL HIGH (ref 100–199)
HDL: 31 mg/dL — AB (ref >39)
LDL CALC: 143 mg/dL — AB (ref 0–99)
LDL/HDL RATIO: 4.6 ratio — AB (ref 0.0–3.2)
Triglycerides: 379 mg/dL — ABNORMAL HIGH (ref 0–149)
VLDL CHOLESTEROL CAL: 76 mg/dL — AB (ref 5–40)

## 2017-07-31 LAB — HEMOGLOBIN A1C
ESTIMATED AVERAGE GLUCOSE: 180 mg/dL
HEMOGLOBIN A1C: 7.9 % — AB (ref 4.8–5.6)

## 2017-07-31 LAB — COMPREHENSIVE METABOLIC PANEL
A/G RATIO: 1.2 (ref 1.2–2.2)
ALBUMIN: 3.9 g/dL (ref 3.6–4.8)
ALK PHOS: 70 IU/L (ref 39–117)
ALT: 17 IU/L (ref 0–32)
AST: 18 IU/L (ref 0–40)
BILIRUBIN TOTAL: 0.3 mg/dL (ref 0.0–1.2)
BUN / CREAT RATIO: 15 (ref 12–28)
BUN: 27 mg/dL (ref 8–27)
CHLORIDE: 100 mmol/L (ref 96–106)
CO2: 23 mmol/L (ref 20–29)
Calcium: 9.3 mg/dL (ref 8.7–10.3)
Creatinine, Ser: 1.83 mg/dL — ABNORMAL HIGH (ref 0.57–1.00)
GFR calc Af Amer: 32 mL/min/{1.73_m2} — ABNORMAL LOW (ref >59)
GFR calc non Af Amer: 28 mL/min/{1.73_m2} — ABNORMAL LOW (ref >59)
GLOBULIN, TOTAL: 3.2 g/dL (ref 1.5–4.5)
Glucose: 259 mg/dL — ABNORMAL HIGH (ref 65–99)
POTASSIUM: 4.8 mmol/L (ref 3.5–5.2)
SODIUM: 138 mmol/L (ref 134–144)
Total Protein: 7.1 g/dL (ref 6.0–8.5)

## 2017-07-31 LAB — VITAMIN D 25 HYDROXY (VIT D DEFICIENCY, FRACTURES): Vit D, 25-Hydroxy: 21.3 ng/mL — ABNORMAL LOW (ref 30.0–100.0)

## 2017-07-31 LAB — VITAMIN B12: VITAMIN B 12: 453 pg/mL (ref 232–1245)

## 2017-07-31 LAB — T3: T3 TOTAL: 102 ng/dL (ref 71–180)

## 2017-07-31 LAB — INSULIN, RANDOM: INSULIN: 37.2 u[IU]/mL — AB (ref 2.6–24.9)

## 2017-07-31 LAB — T4, FREE: FREE T4: 1.2 ng/dL (ref 0.82–1.77)

## 2017-08-03 DIAGNOSIS — E119 Type 2 diabetes mellitus without complications: Secondary | ICD-10-CM | POA: Diagnosis not present

## 2017-08-03 DIAGNOSIS — Z794 Long term (current) use of insulin: Secondary | ICD-10-CM | POA: Diagnosis not present

## 2017-08-04 LAB — MICROALBUMIN / CREATININE URINE RATIO
Creatinine, Urine: 83.2 mg/dL
Microalb/Creat Ratio: <3.6 mg/g creat (ref 0.0–30.0)
Microalbumin, Urine: <3 ug/mL

## 2017-08-11 ENCOUNTER — Ambulatory Visit (INDEPENDENT_AMBULATORY_CARE_PROVIDER_SITE_OTHER): Payer: PPO | Admitting: Family Medicine

## 2017-08-11 VITALS — BP 108/74 | HR 85 | Temp 98.0°F | Ht 63.0 in | Wt 290.0 lb

## 2017-08-11 DIAGNOSIS — Z794 Long term (current) use of insulin: Secondary | ICD-10-CM | POA: Diagnosis not present

## 2017-08-11 DIAGNOSIS — E559 Vitamin D deficiency, unspecified: Secondary | ICD-10-CM

## 2017-08-11 DIAGNOSIS — Z6841 Body Mass Index (BMI) 40.0 and over, adult: Secondary | ICD-10-CM | POA: Diagnosis not present

## 2017-08-11 DIAGNOSIS — E7849 Other hyperlipidemia: Secondary | ICD-10-CM

## 2017-08-11 DIAGNOSIS — E119 Type 2 diabetes mellitus without complications: Secondary | ICD-10-CM

## 2017-08-11 MED ORDER — COENZYME Q10 300 MG PO CAPS: 1.0000 | ORAL_CAPSULE | Freq: Every day | ORAL | 0 refills | Status: DC

## 2017-08-11 MED ORDER — VITAMIN D (ERGOCALCIFEROL) 1.25 MG (50000 UNIT) PO CAPS: 50000.0000 [IU] | ORAL_CAPSULE | ORAL | 0 refills | Status: DC

## 2017-08-11 NOTE — Progress Notes (Signed)
Office: [phone]  /  Fax: [phone]   HPI:   Chief Complaint: OBESITY Alice Kemp is here to discuss her progress with her obesity treatment plan. She is on the Category 3 plan and is following her eating plan approximately 75 % of the time. She states she is exercising 0 minutes 0 times per week. Alice Kemp has done very well with her weight loss efforts on her Category 3 plan. She struggled to eat all of her food and found the change to cooking at home instead of eating out was difficult, just because it was so different than what she is used to.  Her weight is 290 lb (131.5 kg) today and has had a weight loss of 10 pounds over a period of 2 weeks since her last visit. She has lost 10 lbs since starting treatment with [name].  Diabetes II on Insulin Alice Kemp has a diagnosis of diabetes type II. Alice Kemp has changed her 70/30 to 85 in the morning and 55 in the evening, skipping her noontime dose. She denies any hypoglycemic episodes. Last A1c was 7.9 on 07/30/17. She has been working on intensive lifestyle modifications including diet, exercise, and weight loss to help control her blood glucose levels.  Hyperlipidemia Alice Kemp has hyperlipidemia and has been trying to improve her cholesterol levels with intensive lifestyle modification including a low saturated fat diet, exercise and weight loss. She was on crestor but changed to a cheaper statin and cased. She denies any chest pain, claudication or myalgias.  Vitamin D deficiency Alice Kemp has a diagnosis of vitamin D deficiency. She is on prescription Vit D but not yet at goal. She denies nausea, vomiting or muscle weakness.  ALLERGIES: Allergies  Allergen Reactions  . Sulfa Antibiotics   . Penicillins Rash    MEDICATIONS: Current Outpatient Medications on File Prior to Visit  Medication Sig Dispense Refill  . allopurinol (ZYLOPRIM) 100 MG tablet Take 1 tablet (100 mg total) by mouth daily. 90 tablet 3  . Alpha-Lipoic Acid 100 MG CAPS Take by  mouth.    Marland Kitchen amitriptyline (ELAVIL) 10 MG tablet   5  . aspirin 81 MG tablet Take 81 mg by mouth daily. Reported on 12/24/2015    . celecoxib (CELEBREX) 100 MG capsule Take 1 capsule (100 mg total) by mouth daily as needed. Yearly physical w/labs due in August must see MD for future refills 90 capsule 0  . COLCRYS 0.6 MG tablet Take 2 tablets by mouth at the onset of flair and 1 tablet by mouth 1 hour later. 15 tablet 2  . DULoxetine (CYMBALTA) 20 MG capsule Take 2 capsules (40 mg total) by mouth daily. 180 capsule 1  . fluticasone (CUTIVATE) 0.05 % cream APPLY 3 TIMES A DAY AS NEEDED FOR ITCHING 60 g 1  . furosemide (LASIX) 80 MG tablet Take 1 tablet (80 mg total) by mouth daily. 90 tablet 3  . gabapentin (NEURONTIN) 300 MG capsule     . levocetirizine (XYZAL) 5 MG tablet TAKE 1 TABLET BY MOUTH EVERY EVENING 90 tablet 3  . levothyroxine (SYNTHROID, LEVOTHROID) 88 MCG tablet Take 88 mcg by mouth daily before breakfast.    . losartan (COZAAR) 25 MG tablet TAKE 1 TABLET BY MOUTH ONCE DAILY 90 tablet 3  . Magnesium 100 MG CAPS Take 2 capsules by mouth daily.    Marland Kitchen NOVOLOG MIX 70/30 FLEXPEN (70-30) 100 UNIT/ML FlexPen Inject 85 units with breakfast, 60 units with lunch and 85 units with dinner. 225 mL 3  .  ONETOUCH VERIO test strip   3  . spironolactone (ALDACTONE) 25 MG tablet TAKE 1 TABLET (25 MG TOTAL) BY MOUTH DAILY. 90 tablet 3  . traMADol (ULTRAM) 50 MG tablet Take by mouth every 12 (twelve) hours as needed.    . vitamin B-12 (CYANOCOBALAMIN) 500 MCG tablet Take 500 mcg by mouth 2 (two) times daily.     No current facility-administered medications on file prior to visit.     PAST MEDICAL HISTORY: Past Medical History:  Diagnosis Date  . Anxiety   . Arthritis   . Back pain   . Benign neoplasm of colon 06/29/2012  . Breast abscess 07/2011   left; s/p I&D + abx by gen surg  . Cervical radiculopathy at C6   . CKD (chronic kidney disease) stage 3, GFR 30-59 ml/min (HCC)    follows with  renal  . Constipation   . Depression   . Diabetes mellitus type II   . Diastolic heart failure ([place]) 09/2013 echo  . Gall bladder inflammation   . Glaucoma   . Gout   . Hyperlipidemia   . Hypothyroidism   . Joint pain   . Kidney disease   . Leg pain   . Morbid obesity ([place])   . OBSTRUCTIVE SLEEP APNEA 12/23/2010   npsg 2012:  AHI 25/hr, severe desat to 68%.   autoset 2013: optimal pressure 13cm   . PERICARDIAL EFFUSION 12/12/2010  . Pulmonary hypertension ([place])   . Shortness of breath   . Skin lesion of breast 07/09/2012  . Swelling of extremity   . Unspecified hypothyroidism   . Vitamin B12 deficiency   . Vitamin D deficiency     PAST SURGICAL HISTORY: Past Surgical History:  Procedure Laterality Date  . BREAST BIOPSY  1990   Left  . CATARACT EXTRACTION    . SHOULDER SURGERY  2010   Left  . TONSILLECTOMY  1964    SOCIAL HISTORY: Social History   Tobacco Use  . Smoking status: Never Smoker  . Smokeless tobacco: Never Used  Substance Use Topics  . Alcohol use: No    Alcohol/week: 0.0 oz  . Drug use: No    FAMILY HISTORY: Family History  Problem Relation Age of Onset  . Hypertension Mother   . Heart disease Mother   . Heart attack Mother   . Thyroid disease Mother   . Diabetes Father   . Heart disease Father   . Skin cancer Father   . Cancer Father        skin  . Heart attack Father   . Hyperlipidemia Father   . Colon polyps Father   . Depression Father   . Hypertension Brother   . Skin cancer Paternal Grandfather   . Colon cancer Paternal Grandfather 66  . Breast cancer Paternal Aunt   . Cancer Paternal Aunt        breast  . Diabetes Paternal Uncle   . Diabetes Maternal Grandfather   . Sudden death Neg Hx     ROS: Review of Systems  Constitutional: Positive for weight loss.  Cardiovascular: Negative for chest pain and claudication.  Gastrointestinal: Negative for nausea and vomiting.  Musculoskeletal: Negative for myalgias.       Negative  muscle weakness  Endo/Heme/Allergies:       Negative hypoglycemia    PHYSICAL EXAM: Blood pressure 108/74, pulse 85, temperature 98 F (36.7 C), temperature source Oral, height 5\' 3"  (1.6 m), weight 290 lb (131.5 kg), SpO2 92 %.  Body mass index is 51.37 kg/m. Physical Exam  Constitutional: She is oriented to person, place, and time. She appears well-developed and well-nourished.  Cardiovascular: Normal rate.  Pulmonary/Chest: Effort normal.  Musculoskeletal: Normal range of motion.  Neurological: She is oriented to person, place, and time.  Skin: Skin is warm and dry.  Psychiatric: She has a normal mood and affect. Her behavior is normal.  Vitals reviewed.   RECENT LABS AND TESTS: BMET    Component Value Date/Time   NA 138 07/30/2017 1020   K 4.8 07/30/2017 1020   CL 100 07/30/2017 1020   CO2 23 07/30/2017 1020   GLUCOSE 259 (H) 07/30/2017 1020   GLUCOSE 130 (H) 01/08/2016 1649   BUN 27 07/30/2017 1020   CREATININE 1.83 (H) 07/30/2017 1020   CALCIUM 9.3 07/30/2017 1020   GFRNONAA 28 (L) 07/30/2017 1020   GFRAA 32 (L) 07/30/2017 1020   Lab Results  Component Value Date   HGBA1C 7.9 (H) 07/30/2017   HGBA1C 8.1 (H) 04/10/2015   HGBA1C 10.0 (H) 04/11/2014   HGBA1C 7.6 (A) 09/02/2013   HGBA1C 8.9 (H) 08/01/2013   Lab Results  Component Value Date   INSULIN 37.2 (H) 07/30/2017   CBC    Component Value Date/Time   WBC 7.9 07/30/2017 1020   WBC 8.9 09/23/2013 1149   RBC 4.27 07/30/2017 1020   RBC 4.58 09/23/2013 1149   HGB 13.2 07/30/2017 1020   HCT 40.3 07/30/2017 1020   PLT 213 12/15/2016   MCV 94 07/30/2017 1020   MCH 30.9 07/30/2017 1020   MCH 28.1 01/29/2011 0500   MCHC 32.8 07/30/2017 1020   MCHC 33.0 09/23/2013 1149   RDW 13.5 07/30/2017 1020   LYMPHSABS 4.1 (H) 07/30/2017 1020   MONOABS 0.6 09/23/2013 1149   EOSABS 0.3 07/30/2017 1020   BASOSABS 0.0 07/30/2017 1020   Iron/TIBC/Ferritin/ %Sat No results found for: IRON, TIBC, FERRITIN,  IRONPCTSAT Lipid Panel     Component Value Date/Time   CHOL 250 (H) 07/30/2017 1020   TRIG 379 (H) 07/30/2017 1020   HDL 31 (L) 07/30/2017 1020   CHOLHDL 4 04/10/2015 1212   VLDL 38.8 04/10/2015 1212   LDLCALC 143 (H) 07/30/2017 1020   LDLDIRECT 54.6 05/10/2012 1440   Hepatic Function Panel     Component Value Date/Time   PROT 7.1 07/30/2017 1020   ALBUMIN 3.9 07/30/2017 1020   AST 18 07/30/2017 1020   ALT 17 07/30/2017 1020   ALKPHOS 70 07/30/2017 1020   BILITOT 0.3 07/30/2017 1020   BILIDIR 0.1 05/10/2012 1440      Component Value Date/Time   TSH 2.610 07/30/2017 1020   TSH 1.62 04/10/2015 1212   TSH 1.65 09/23/2013 1149    ASSESSMENT AND PLAN: Other hyperlipidemia - Plan: Coenzyme Q10 (EQL COQ10) 300 MG CAPS  Vitamin D deficiency  Type 2 diabetes mellitus without complication, with long-term current use of insulin (HCC)  Class 3 severe obesity with serious comorbidity and body mass index (BMI) of 50.0 to 59.9 in adult, unspecified obesity type ([place])  PLAN:  Diabetes II on Insulin Alice Kemp has been given extensive diabetes education by myself today including ideal fasting and post-prandial blood glucose readings, individual ideal Hgb A1c goals and hypoglycemia prevention. We discussed the importance of good blood sugar control to decrease the likelihood of diabetic complications such as nephropathy, neuropathy, limb loss, blindness, coronary artery disease, and death. We discussed the importance of intensive lifestyle modification including diet, exercise and weight loss as the  first line treatment for diabetes. Zandra agrees to decrease her morning insulin to 65 if BGs is below 100, and decrease evening insulin to 40 units if evening BGs is below 150. Jadis agrees to follow up with our clinic in 2 weeks.  Hyperlipidemia Alice Kemp was informed of the American Heart Association Guidelines emphasizing intensive lifestyle modifications as the first line treatment for  hyperlipidemia. We discussed many lifestyle modifications today in depth, and Alice Kemp will continue to work on decreasing saturated fats such as fatty red meat, butter and many fried foods. She will also increase vegetables and lean protein in her diet and continue to work on exercise and weight loss efforts. Alice Kemp agrees to start CoQ10 300 mg qd and plan on restarting statin in 1 month. Alice Kemp agrees to follow up with our clinic in 2 weeks.  Vitamin D Deficiency Alice Kemp was informed that low vitamin D levels contributes to fatigue and are associated with obesity, breast, and colon cancer. Alice Kemp agrees to increase prescription Vit D @50 ,000 IU to q 3 days #10 with no refills and will follow up for routine testing of vitamin D, at least 2-3 times per year. She was informed of the risk of over-replacement of vitamin D and agrees to not increase her dose unless he discusses this with [name] first. Alice Kemp agrees to follow up with our clinic in 2 weeks.  Obesity Alice Kemp is currently in the action stage of change. As such, her goal is to continue with weight loss efforts She has agreed to follow the Category 3 plan Alice Kemp has been instructed to work up to a goal of 150 minutes of combined cardio and strengthening exercise per week for weight loss and overall health benefits. We discussed the following Behavioral Modification Strategies today: increasing lean protein intake, decreasing simple carbohydrates, work on meal planning and easy cooking plans, keeping healthy foods in the home, better snacking choices, and planning for success.   Alice Kemp has agreed to follow up with our clinic in 2 weeks. She was informed of the importance of frequent follow up visits to maximize her success with intensive lifestyle modifications for her multiple health conditions.  I, Alice Kemp, am acting as transcriptionist for Alice Nip, MD  I have reviewed the above documentation for accuracy and completeness, and I agree  with the above. -Alice Nip, MD      Today's visit was # 2 out of 7.  Starting weight: 300 lbs Starting date: 07/29/17 Today's weight : 290 lbs  Today's date: 08/11/2017 Total lbs lost to date: 10 (Patients must lose 7 lbs in the first 6 months to continue with counseling)   ASK: We discussed the diagnosis of obesity with Alice Kemp today and Alice Kemp agreed to give [name] permission to discuss obesity behavioral modification therapy today.  ASSESS: Alice Kemp has the diagnosis of obesity and her BMI today is 51.38 Alice Kemp is in the action stage of change   ADVISE: Alice Kemp was educated on the multiple health risks of obesity as well as the benefit of weight loss to improve her health. She was advised of the need for long term treatment and the importance of lifestyle modifications.  AGREE: Multiple dietary modification options and treatment options were discussed and  Alice Kemp agreed to follow the Category 3 plan We discussed the following Behavioral Modification Strategies today: increasing lean protein intake, decreasing simple carbohydrates, work on meal planning and easy cooking plans, keeping healthy foods in the home, better snacking choices, and planning for success.

## 2017-08-25 ENCOUNTER — Ambulatory Visit (INDEPENDENT_AMBULATORY_CARE_PROVIDER_SITE_OTHER): Payer: PPO | Admitting: Family Medicine

## 2017-08-25 VITALS — BP 108/70 | HR 86 | Temp 97.9°F | Ht 63.0 in | Wt 286.0 lb

## 2017-08-25 DIAGNOSIS — Z6841 Body Mass Index (BMI) 40.0 and over, adult: Secondary | ICD-10-CM

## 2017-08-25 DIAGNOSIS — E119 Type 2 diabetes mellitus without complications: Secondary | ICD-10-CM | POA: Diagnosis not present

## 2017-08-25 DIAGNOSIS — Z794 Long term (current) use of insulin: Secondary | ICD-10-CM | POA: Diagnosis not present

## 2017-08-25 MED FILL — ONETOUCH VERIO TEST STRIP: 30 days supply | Qty: 200 | Fill #1

## 2017-08-25 MED FILL — COLCHICINE 0.6 MG TABS: 0.6 | 5 days supply | Qty: 15 | Fill #2

## 2017-08-25 MED FILL — VIT D2 1.25 MG (50,000 UNIT: 1.25 MG | 30 days supply | Qty: 10 | Fill #0

## 2017-08-25 NOTE — Progress Notes (Signed)
Office: [phone]  /  Fax: [phone]   HPI:   Chief Complaint: OBESITY Alice Kemp is here to discuss her progress with her obesity treatment plan. She is on the Category 3 plan and is following her eating plan approximately 60 % of the time. She states she is exercising 0 minutes 0 times per week. Alice Kemp continues to lose weight but is deviating from her plan more, she is mostly trying to portion control and make smarter food choices.  Her weight is 286 lb (129.7 kg) today and has had a weight loss of 4 pounds over a period of 2 weeks since her last visit. She has lost 14 lbs since starting treatment with [name].  Diabetes II Alice Kemp has a diagnosis of diabetes type II. Alice Kemp states fasting BGs range between 150 and 200's and she has had a hypoglycemic episode last evening of 40. Today it was 137. On 70/30 45 at night, 38 every morning. Normal evening fasting BGs range in 200's. Last A1c was 7.9 on 07/30/17. She has been working on intensive lifestyle modifications including diet, exercise, and weight loss to help control her blood glucose levels.  ALLERGIES: Allergies  Allergen Reactions  . Sulfa Antibiotics   . Penicillins Rash    MEDICATIONS: Current Outpatient Medications on File Prior to Visit  Medication Sig Dispense Refill  . allopurinol (ZYLOPRIM) 100 MG tablet Take 1 tablet (100 mg total) by mouth daily. 90 tablet 3  . Alpha-Lipoic Acid 100 MG CAPS Take by mouth.    Marland Kitchen amitriptyline (ELAVIL) 10 MG tablet   5  . aspirin 81 MG tablet Take 81 mg by mouth daily. Reported on 12/24/2015    . celecoxib (CELEBREX) 100 MG capsule Take 1 capsule (100 mg total) by mouth daily as needed. Yearly physical w/labs due in August must see MD for future refills 90 capsule 0  . Coenzyme Q10 (EQL COQ10) 300 MG CAPS Take 1 capsule (300 mg total) daily at 12 noon by mouth. 30 capsule 0  . COLCRYS 0.6 MG tablet Take 2 tablets by mouth at the onset of flair and 1 tablet by mouth 1 hour later. 15 tablet  2  . DULoxetine (CYMBALTA) 20 MG capsule Take 2 capsules (40 mg total) by mouth daily. 180 capsule 1  . fluticasone (CUTIVATE) 0.05 % cream APPLY 3 TIMES A DAY AS NEEDED FOR ITCHING 60 g 1  . furosemide (LASIX) 80 MG tablet Take 1 tablet (80 mg total) by mouth daily. 90 tablet 3  . gabapentin (NEURONTIN) 300 MG capsule     . levocetirizine (XYZAL) 5 MG tablet TAKE 1 TABLET BY MOUTH EVERY EVENING 90 tablet 3  . levothyroxine (SYNTHROID, LEVOTHROID) 88 MCG tablet Take 88 mcg by mouth daily before breakfast.    . losartan (COZAAR) 25 MG tablet TAKE 1 TABLET BY MOUTH ONCE DAILY 90 tablet 3  . Magnesium 100 MG CAPS Take 2 capsules by mouth daily.    Marland Kitchen NOVOLOG MIX 70/30 FLEXPEN (70-30) 100 UNIT/ML FlexPen Inject 85 units with breakfast, 60 units with lunch and 85 units with dinner. 225 mL 3  . ONETOUCH VERIO test strip   3  . spironolactone (ALDACTONE) 25 MG tablet TAKE 1 TABLET (25 MG TOTAL) BY MOUTH DAILY. 90 tablet 3  . traMADol (ULTRAM) 50 MG tablet Take by mouth every 12 (twelve) hours as needed.    . vitamin B-12 (CYANOCOBALAMIN) 500 MCG tablet Take 500 mcg by mouth 2 (two) times daily.    . Vitamin  D, Ergocalciferol, (DRISDOL) 50000 units CAPS capsule Take 1 capsule (50,000 Units total) every 3 (three) days by mouth. 10 capsule 0   No current facility-administered medications on file prior to visit.     PAST MEDICAL HISTORY: Past Medical History:  Diagnosis Date  . Anxiety   . Arthritis   . Back pain   . Benign neoplasm of colon 06/29/2012  . Breast abscess 07/2011   left; s/p I&D + abx by gen surg  . Cervical radiculopathy at C6   . CKD (chronic kidney disease) stage 3, GFR 30-59 ml/min (HCC)    follows with renal  . Constipation   . Depression   . Diabetes mellitus type II   . Diastolic heart failure ([place]) 09/2013 echo  . Gall bladder inflammation   . Glaucoma   . Gout   . Hyperlipidemia   . Hypothyroidism   . Joint pain   . Kidney disease   . Leg pain   . Morbid obesity  ([place])   . OBSTRUCTIVE SLEEP APNEA 12/23/2010   npsg 2012:  AHI 25/hr, severe desat to 68%.   autoset 2013: optimal pressure 13cm   . PERICARDIAL EFFUSION 12/12/2010  . Pulmonary hypertension ([place])   . Shortness of breath   . Skin lesion of breast 07/09/2012  . Swelling of extremity   . Unspecified hypothyroidism   . Vitamin B12 deficiency   . Vitamin D deficiency     PAST SURGICAL HISTORY: Past Surgical History:  Procedure Laterality Date  . BREAST BIOPSY  1990   Left  . CATARACT EXTRACTION    . COLONOSCOPY  06/29/2012   Procedure: COLONOSCOPY;  Surgeon: Ladene Artist, MD,FACG;  Location: [facility];  Service: Endoscopy;  Laterality: N/A;  . SHOULDER SURGERY  2010   Left  . TONSILLECTOMY  1964    SOCIAL HISTORY: Social History   Tobacco Use  . Smoking status: Never Smoker  . Smokeless tobacco: Never Used  Substance Use Topics  . Alcohol use: No    Alcohol/week: 0.0 oz  . Drug use: No    FAMILY HISTORY: Family History  Problem Relation Age of Onset  . Hypertension Mother   . Heart disease Mother   . Heart attack Mother   . Thyroid disease Mother   . Diabetes Father   . Heart disease Father   . Skin cancer Father   . Cancer Father        skin  . Heart attack Father   . Hyperlipidemia Father   . Colon polyps Father   . Depression Father   . Hypertension Brother   . Skin cancer Paternal Grandfather   . Colon cancer Paternal Grandfather 84  . Breast cancer Paternal Aunt   . Cancer Paternal Aunt        breast  . Diabetes Paternal Uncle   . Diabetes Maternal Grandfather   . Sudden death Neg Hx     ROS: Review of Systems  Constitutional: Positive for weight loss.  Endo/Heme/Allergies:       Positive hypoglycemia    PHYSICAL EXAM: Blood pressure 108/70, pulse 86, temperature 97.9 F (36.6 C), temperature source Oral, height 5\' 3"  (1.6 m), weight 286 lb (129.7 kg), SpO2 98 %. Body mass index is 50.66 kg/m. Physical Exam  Constitutional: She is  oriented to person, place, and time. She appears well-developed and well-nourished.  Cardiovascular: Normal rate.  Pulmonary/Chest: Effort normal.  Musculoskeletal: Normal range of motion.  Neurological: She is oriented to person, place,  and time.  Skin: Skin is warm and dry.  Psychiatric: She has a normal mood and affect. Her behavior is normal.  Vitals reviewed.   RECENT LABS AND TESTS: BMET    Component Value Date/Time   NA 138 07/30/2017 1020   K 4.8 07/30/2017 1020   CL 100 07/30/2017 1020   CO2 23 07/30/2017 1020   GLUCOSE 259 (H) 07/30/2017 1020   GLUCOSE 130 (H) 01/08/2016 1649   BUN 27 07/30/2017 1020   CREATININE 1.83 (H) 07/30/2017 1020   CALCIUM 9.3 07/30/2017 1020   GFRNONAA 28 (L) 07/30/2017 1020   GFRAA 32 (L) 07/30/2017 1020   Lab Results  Component Value Date   HGBA1C 7.9 (H) 07/30/2017   HGBA1C 8.1 (H) 04/10/2015   HGBA1C 10.0 (H) 04/11/2014   HGBA1C 7.6 (A) 09/02/2013   HGBA1C 8.9 (H) 08/01/2013   Lab Results  Component Value Date   INSULIN 37.2 (H) 07/30/2017   CBC    Component Value Date/Time   WBC 7.9 07/30/2017 1020   WBC 8.9 09/23/2013 1149   RBC 4.27 07/30/2017 1020   RBC 4.58 09/23/2013 1149   HGB 13.2 07/30/2017 1020   HCT 40.3 07/30/2017 1020   PLT 213 12/15/2016   MCV 94 07/30/2017 1020   MCH 30.9 07/30/2017 1020   MCH 28.1 01/29/2011 0500   MCHC 32.8 07/30/2017 1020   MCHC 33.0 09/23/2013 1149   RDW 13.5 07/30/2017 1020   LYMPHSABS 4.1 (H) 07/30/2017 1020   MONOABS 0.6 09/23/2013 1149   EOSABS 0.3 07/30/2017 1020   BASOSABS 0.0 07/30/2017 1020   Iron/TIBC/Ferritin/ %Sat No results found for: IRON, TIBC, FERRITIN, IRONPCTSAT Lipid Panel     Component Value Date/Time   CHOL 250 (H) 07/30/2017 1020   TRIG 379 (H) 07/30/2017 1020   HDL 31 (L) 07/30/2017 1020   CHOLHDL 4 04/10/2015 1212   VLDL 38.8 04/10/2015 1212   LDLCALC 143 (H) 07/30/2017 1020   LDLDIRECT 54.6 05/10/2012 1440   Hepatic Function Panel     Component  Value Date/Time   PROT 7.1 07/30/2017 1020   ALBUMIN 3.9 07/30/2017 1020   AST 18 07/30/2017 1020   ALT 17 07/30/2017 1020   ALKPHOS 70 07/30/2017 1020   BILITOT 0.3 07/30/2017 1020   BILIDIR 0.1 05/10/2012 1440      Component Value Date/Time   TSH 2.610 07/30/2017 1020   TSH 1.62 04/10/2015 1212   TSH 1.65 09/23/2013 1149    ASSESSMENT AND PLAN: Type 2 diabetes mellitus without complication, with long-term current use of insulin (HCC)  Class 3 severe obesity with serious comorbidity and body mass index (BMI) of 50.0 to 59.9 in adult, unspecified obesity type ([place])  PLAN:  Diabetes II Alice Kemp has been given extensive diabetes education by myself today including ideal fasting and post-prandial blood glucose readings, individual ideal Hgb A1c goals and hypoglycemia prevention. We discussed the importance of good blood sugar control to decrease the likelihood of diabetic complications such as nephropathy, neuropathy, limb loss, blindness, coronary artery disease, and death. We discussed the importance of intensive lifestyle modification including diet, exercise and weight loss as the first line treatment for diabetes. Alice Kemp agrees to continue Novolog mix 70/30 as is now and make sure to not skip dinner. Alice Kemp agrees to follow up with our clinic in 2 to 3 weeks.  We spent > than 50% of the 15 minute visit on the counseling as documented in the note.  Obesity Alice Kemp is currently in the action stage  of change. As such, her goal is to continue with weight loss efforts She has agreed to follow the Category 3 plan Alice Kemp has been instructed to work up to a goal of 150 minutes of combined cardio and strengthening exercise per week for weight loss and overall health benefits. We discussed the following Behavioral Modification Strategies today: dealing with family or coworker sabotage, holiday eating strategies, and travel eating strategies    Alice Kemp has agreed to follow up with our clinic  in 2 to 3 weeks. She was informed of the importance of frequent follow up visits to maximize her success with intensive lifestyle modifications for her multiple health conditions.  I, Alice Kemp, am acting as transcriptionist for Dennard Nip, MD  I have reviewed the above documentation for accuracy and completeness, and I agree with the above. -Dennard Nip, MD      Today's visit was # 3 out of 22.  Starting weight: 300 lbs Starting date: 07/29/17 Today's weight : 286 lbs  Today's date: 08/25/2017 Total lbs lost to date: 14 (Patients must lose 7 lbs in the first 6 months to continue with counseling)   ASK: We discussed the diagnosis of obesity with Alice Kemp today and Kripa agreed to give [name] permission to discuss obesity behavioral modification therapy today.  ASSESS: Natalye has the diagnosis of obesity and her BMI today is 50.68 Oriyah is in the action stage of change   ADVISE: Aalani was educated on the multiple health risks of obesity as well as the benefit of weight loss to improve her health. She was advised of the need for long term treatment and the importance of lifestyle modifications.  AGREE: Multiple dietary modification options and treatment options were discussed and  Chelisa agreed to follow the Category 3 plan We discussed the following Behavioral Modification Strategies today: dealing with family or coworker sabotage, holiday eating strategies, and travel eating strategies

## 2017-09-08 ENCOUNTER — Ambulatory Visit (INDEPENDENT_AMBULATORY_CARE_PROVIDER_SITE_OTHER): Payer: PPO | Admitting: Family Medicine

## 2017-09-08 VITALS — BP 117/73 | HR 82 | Temp 97.9°F | Ht 63.0 in | Wt 286.0 lb

## 2017-09-08 DIAGNOSIS — Z794 Long term (current) use of insulin: Secondary | ICD-10-CM

## 2017-09-08 DIAGNOSIS — E119 Type 2 diabetes mellitus without complications: Secondary | ICD-10-CM

## 2017-09-08 DIAGNOSIS — F418 Other specified anxiety disorders: Secondary | ICD-10-CM

## 2017-09-08 DIAGNOSIS — Z6841 Body Mass Index (BMI) 40.0 and over, adult: Secondary | ICD-10-CM | POA: Diagnosis not present

## 2017-09-08 MED ORDER — DULOXETINE HCL 30 MG PO CPEP: 30.0000 mg | ORAL_CAPSULE | Freq: Two times a day (BID) | ORAL | 0 refills | Status: DC

## 2017-09-08 MED FILL — DULoxetine HCL 30 MG CPEP: 30 | 30 days supply | Qty: 60 | Fill #0

## 2017-09-08 NOTE — Progress Notes (Signed)
Office: [phone]  /  Fax: [phone]   HPI:   Chief Complaint: OBESITY Alice Kemp is here to discuss her progress with her obesity treatment plan. She is on  the Category 3 plan and is following her eating plan approximately 50 % of the time. She states she is exercising upper body exercises for 5 minutes 7 times per week. [name] did well maintaining weight over Thanksgiving. She is struggling with emotional eating with increased family stress. Her weight is 286 lb (129.7 kg) today and has maintained weight over a period of 2 weeks since her last visit. She has lost 14 lbs since starting treatment with [name].  Diabetes II Alice Kemp has a diagnosis of diabetes type II. [name] states fasting BGs range between 110 and 140's and 2 hour post prandial BGs range mostly between 140 and 170 with less glucose excursions. Alice Kemp denies any hypoglycemic episodes. She has been working on intensive lifestyle modifications including diet, exercise, and weight loss to help control her blood glucose levels.  Depression with anxiety Alice Kemp is on Cymbalta, but she is under a lot of stress with fathers dementia. She is tearful in office today discussing her family. Alice Kemp struggles with emotional eating and using food for comfort to the extent that it is negatively impacting her health. She often snacks when she is not hungry. Alice Kemp sometimes feels she is out of control and then feels guilty that she made poor food choices. She has been working on behavior modification techniques to help reduce her emotional eating and has been somewhat successful. She shows no sign of suicidal or homicidal ideations.  Depression screen [facility] 2/9 07/29/2017 01/27/2017 01/08/2016  Decreased Interest 3 3 3   Down, Depressed, Hopeless 3 2 3   PHQ - 2 Score 6 5 6   Altered sleeping 1 3 0  Tired, decreased energy 3 3 3   Change in appetite 2 3 1   Feeling bad or failure about yourself  3 2 2   Trouble concentrating 1 3 0  Moving slowly or  fidgety/restless 3 1 0  Suicidal thoughts 3 0 0  PHQ-9 Score 22 20 12   Difficult doing work/chores Extremely dIfficult Somewhat difficult Somewhat difficult     ALLERGIES: Allergies  Allergen Reactions  . Sulfa Antibiotics   . Penicillins Rash    MEDICATIONS: Current Outpatient Medications on File Prior to Visit  Medication Sig Dispense Refill  . allopurinol (ZYLOPRIM) 100 MG tablet Take 1 tablet (100 mg total) by mouth daily. 90 tablet 3  . Alpha-Lipoic Acid 100 MG CAPS Take by mouth.    Marland Kitchen amitriptyline (ELAVIL) 10 MG tablet   5  . aspirin 81 MG tablet Take 81 mg by mouth daily. Reported on 12/24/2015    . celecoxib (CELEBREX) 100 MG capsule Take 1 capsule (100 mg total) by mouth daily as needed. Yearly physical w/labs due in August must see MD for future refills 90 capsule 0  . Coenzyme Q10 (EQL COQ10) 300 MG CAPS Take 1 capsule (300 mg total) daily at 12 noon by mouth. 30 capsule 0  . COLCRYS 0.6 MG tablet Take 2 tablets by mouth at the onset of flair and 1 tablet by mouth 1 hour later. 15 tablet 2  . fluticasone (CUTIVATE) 0.05 % cream APPLY 3 TIMES A DAY AS NEEDED FOR ITCHING 60 g 1  . furosemide (LASIX) 80 MG tablet Take 1 tablet (80 mg total) by mouth daily. 90 tablet 3  . gabapentin (NEURONTIN) 300 MG capsule     .  levocetirizine (XYZAL) 5 MG tablet TAKE 1 TABLET BY MOUTH EVERY EVENING 90 tablet 3  . levothyroxine (SYNTHROID, LEVOTHROID) 88 MCG tablet Take 88 mcg by mouth daily before breakfast.    . losartan (COZAAR) 25 MG tablet TAKE 1 TABLET BY MOUTH ONCE DAILY 90 tablet 3  . Magnesium 100 MG CAPS Take 2 capsules by mouth daily.    Marland Kitchen NOVOLOG MIX 70/30 FLEXPEN (70-30) 100 UNIT/ML FlexPen Inject 85 units with breakfast, 60 units with lunch and 85 units with dinner. 225 mL 3  . ONETOUCH VERIO test strip   3  . spironolactone (ALDACTONE) 25 MG tablet TAKE 1 TABLET (25 MG TOTAL) BY MOUTH DAILY. 90 tablet 3  . traMADol (ULTRAM) 50 MG tablet Take by mouth every 12 (twelve) hours  as needed.    . vitamin B-12 (CYANOCOBALAMIN) 500 MCG tablet Take 500 mcg by mouth 2 (two) times daily.    . Vitamin D, Ergocalciferol, (DRISDOL) 50000 units CAPS capsule Take 1 capsule (50,000 Units total) every 3 (three) days by mouth. 10 capsule 0   No current facility-administered medications on file prior to visit.     PAST MEDICAL HISTORY: Past Medical History:  Diagnosis Date  . Anxiety   . Arthritis   . Back pain   . Benign neoplasm of colon 06/29/2012  . Breast abscess 07/2011   left; s/p I&D + abx by gen surg  . Cervical radiculopathy at C6   . CKD (chronic kidney disease) stage 3, GFR 30-59 ml/min (HCC)    follows with renal  . Constipation   . Depression   . Diabetes mellitus type II   . Diastolic heart failure ([place]) 09/2013 echo  . Gall bladder inflammation   . Glaucoma   . Gout   . Hyperlipidemia   . Hypothyroidism   . Joint pain   . Kidney disease   . Leg pain   . Morbid obesity ([place])   . OBSTRUCTIVE SLEEP APNEA 12/23/2010   npsg 2012:  AHI 25/hr, severe desat to 68%.   autoset 2013: optimal pressure 13cm   . PERICARDIAL EFFUSION 12/12/2010  . Pulmonary hypertension ([place])   . Shortness of breath   . Skin lesion of breast 07/09/2012  . Swelling of extremity   . Unspecified hypothyroidism   . Vitamin B12 deficiency   . Vitamin D deficiency     PAST SURGICAL HISTORY: Past Surgical History:  Procedure Laterality Date  . BREAST BIOPSY  1990   Left  . CATARACT EXTRACTION    . COLONOSCOPY  06/29/2012   Procedure: COLONOSCOPY;  Surgeon: Ladene Artist, MD,FACG;  Location: [facility];  Service: Endoscopy;  Laterality: N/A;  . SHOULDER SURGERY  2010   Left  . TONSILLECTOMY  1964    SOCIAL HISTORY: Social History   Tobacco Use  . Smoking status: Never Smoker  . Smokeless tobacco: Never Used  Substance Use Topics  . Alcohol use: No    Alcohol/week: 0.0 oz  . Drug use: No    FAMILY HISTORY: Family History  Problem Relation Age of Onset  .  Hypertension Mother   . Heart disease Mother   . Heart attack Mother   . Thyroid disease Mother   . Diabetes Father   . Heart disease Father   . Skin cancer Father   . Cancer Father        skin  . Heart attack Father   . Hyperlipidemia Father   . Colon polyps Father   . Depression  Father   . Hypertension Brother   . Skin cancer Paternal Grandfather   . Colon cancer Paternal Grandfather 47  . Breast cancer Paternal Aunt   . Cancer Paternal Aunt        breast  . Diabetes Paternal Uncle   . Diabetes Maternal Grandfather   . Sudden death Neg Hx     ROS: Review of Systems  Constitutional: Negative for weight loss.  Endo/Heme/Allergies:       Negative hypoglycemia  Psychiatric/Behavioral: Positive for depression. Negative for suicidal ideas. The patient is nervous/anxious (anxiety).     PHYSICAL EXAM: Blood pressure 117/73, pulse 82, temperature 97.9 F (36.6 C), temperature source Oral, height 5\' 3"  (1.6 m), weight 286 lb (129.7 kg), SpO2 96 %. Body mass index is 50.66 kg/m. Physical Exam  Constitutional: She is oriented to person, place, and time. She appears well-developed and well-nourished.  Cardiovascular: Normal rate.  Pulmonary/Chest: Effort normal.  Musculoskeletal: Normal range of motion.  Neurological: She is oriented to person, place, and time.  Skin: Skin is warm and dry.  Psychiatric: She has a normal mood and affect. Her behavior is normal.  Vitals reviewed.   RECENT LABS AND TESTS: BMET    Component Value Date/Time   NA 138 07/30/2017 1020   K 4.8 07/30/2017 1020   CL 100 07/30/2017 1020   CO2 23 07/30/2017 1020   GLUCOSE 259 (H) 07/30/2017 1020   GLUCOSE 130 (H) 01/08/2016 1649   BUN 27 07/30/2017 1020   CREATININE 1.83 (H) 07/30/2017 1020   CALCIUM 9.3 07/30/2017 1020   GFRNONAA 28 (L) 07/30/2017 1020   GFRAA 32 (L) 07/30/2017 1020   Lab Results  Component Value Date   HGBA1C 7.9 (H) 07/30/2017   HGBA1C 8.1 (H) 04/10/2015   HGBA1C 10.0  (H) 04/11/2014   HGBA1C 7.6 (A) 09/02/2013   HGBA1C 8.9 (H) 08/01/2013   Lab Results  Component Value Date   INSULIN 37.2 (H) 07/30/2017   CBC    Component Value Date/Time   WBC 7.9 07/30/2017 1020   WBC 8.9 09/23/2013 1149   RBC 4.27 07/30/2017 1020   RBC 4.58 09/23/2013 1149   HGB 13.2 07/30/2017 1020   HCT 40.3 07/30/2017 1020   PLT 213 12/15/2016   MCV 94 07/30/2017 1020   MCH 30.9 07/30/2017 1020   MCH 28.1 01/29/2011 0500   MCHC 32.8 07/30/2017 1020   MCHC 33.0 09/23/2013 1149   RDW 13.5 07/30/2017 1020   LYMPHSABS 4.1 (H) 07/30/2017 1020   MONOABS 0.6 09/23/2013 1149   EOSABS 0.3 07/30/2017 1020   BASOSABS 0.0 07/30/2017 1020   Iron/TIBC/Ferritin/ %Sat No results found for: IRON, TIBC, FERRITIN, IRONPCTSAT Lipid Panel     Component Value Date/Time   CHOL 250 (H) 07/30/2017 1020   TRIG 379 (H) 07/30/2017 1020   HDL 31 (L) 07/30/2017 1020   CHOLHDL 4 04/10/2015 1212   VLDL 38.8 04/10/2015 1212   LDLCALC 143 (H) 07/30/2017 1020   LDLDIRECT 54.6 05/10/2012 1440   Hepatic Function Panel     Component Value Date/Time   PROT 7.1 07/30/2017 1020   ALBUMIN 3.9 07/30/2017 1020   AST 18 07/30/2017 1020   ALT 17 07/30/2017 1020   ALKPHOS 70 07/30/2017 1020   BILITOT 0.3 07/30/2017 1020   BILIDIR 0.1 05/10/2012 1440      Component Value Date/Time   TSH 2.610 07/30/2017 1020   TSH 1.62 04/10/2015 1212   TSH 1.65 09/23/2013 1149    ASSESSMENT AND PLAN:  Type 2 diabetes mellitus without complication, with long-term current use of insulin (HCC)  Depression with anxiety - Plan: DULoxetine (CYMBALTA) 30 MG capsule  Class 3 severe obesity with serious comorbidity and body mass index (BMI) of 50.0 to 59.9 in adult, unspecified obesity type ([place])  PLAN:  Diabetes II Emiko has been given extensive diabetes education by myself today including ideal fasting and post-prandial blood glucose readings, individual ideal Hgb A1c goals  and hypoglycemia prevention. We  discussed the importance of good blood sugar control to decrease the likelihood of diabetic complications such as nephropathy, neuropathy, limb loss, blindness, coronary artery disease, and death. We discussed the importance of intensive lifestyle modification including diet, exercise and weight loss as the first line treatment for diabetes. Anysha agrees to continue her medications as prescribed and will continue to work on diet and exercise. Damya agrees to follow up at the agreed upon time.  Depression with anxiety We discussed behavior modification techniques today to help Dorethy deal with her emotional eating and depression. She has agreed to increase Cymbalta 30 mg to bid #60 with no refills and will follow up as directed.  Obesity Aracelie is currently in the action stage of change. As such, her goal is to continue with weight loss efforts She has agreed to follow the Category 3 plan Marjoria has been instructed to work up to a goal of 150 minutes of combined cardio and strengthening exercise per week for weight loss and overall health benefits. We discussed the following Behavioral Modification Strategies today: dealing with family or coworker sabotage and emotional eating strategies  Debarah has agreed to follow up with our clinic in 2 weeks. She was informed of the importance of frequent follow up visits to maximize her success with intensive lifestyle modifications for her multiple health conditions.  I, Doreene Nest, am acting as transcriptionist for Dennard Nip, MD  I have reviewed the above documentation for accuracy and completeness, and I agree with the above. -Dennard Nip, MD   OBESITY BEHAVIORAL INTERVENTION VISIT  Today's visit was # 4 out of 22.  Starting weight: 300 lbs Starting date: 07/29/17 Today's weight : 286 lbs  Today's date: 09/08/2017 Total lbs lost to date: 14 (Patients must lose 7 lbs in the first 6 months to continue with counseling)   ASK: We  discussed the diagnosis of obesity with Ed Blalock today and Karesa agreed to give [name] permission to discuss obesity behavioral modification therapy today.  ASSESS: Taura has the diagnosis of obesity and her BMI today is 50.68 Emerita is in the action stage of change   ADVISE: Briyanna was educated on the multiple health risks of obesity as well as the benefit of weight loss to improve her health. She was advised of the need for long term treatment and the importance of lifestyle modifications.  AGREE: Multiple dietary modification options and treatment options were discussed and  Tilda agreed to follow the Category 3 plan We discussed the following Behavioral Modification Strategies today: dealing with family or coworker sabotage and emotional eating strategies

## 2017-09-21 ENCOUNTER — Other Ambulatory Visit: Payer: Self-pay | Admitting: Internal Medicine

## 2017-09-21 MED FILL — GABAPENTIN 300 MG CAPSULE: 300 | 90 days supply | Qty: 270 | Fill #1

## 2017-09-22 ENCOUNTER — Ambulatory Visit (INDEPENDENT_AMBULATORY_CARE_PROVIDER_SITE_OTHER): Payer: PPO | Admitting: Family Medicine

## 2017-09-22 VITALS — BP 119/73 | HR 98 | Temp 97.8°F | Ht 63.0 in | Wt 277.0 lb

## 2017-09-22 DIAGNOSIS — E7849 Other hyperlipidemia: Secondary | ICD-10-CM

## 2017-09-22 DIAGNOSIS — E559 Vitamin D deficiency, unspecified: Secondary | ICD-10-CM | POA: Diagnosis not present

## 2017-09-22 DIAGNOSIS — F418 Other specified anxiety disorders: Secondary | ICD-10-CM

## 2017-09-22 DIAGNOSIS — Z6841 Body Mass Index (BMI) 40.0 and over, adult: Secondary | ICD-10-CM

## 2017-09-22 DIAGNOSIS — E119 Type 2 diabetes mellitus without complications: Secondary | ICD-10-CM

## 2017-09-22 DIAGNOSIS — Z794 Long term (current) use of insulin: Secondary | ICD-10-CM | POA: Diagnosis not present

## 2017-09-24 MED ORDER — VITAMIN D (ERGOCALCIFEROL) 1.25 MG (50000 UNIT) PO CAPS: 50000.0000 [IU] | ORAL_CAPSULE | ORAL | 0 refills | Status: DC

## 2017-09-24 MED ORDER — ROSUVASTATIN CALCIUM 5 MG PO TABS: 5.0000 mg | ORAL_TABLET | Freq: Every day | ORAL | 3 refills | Status: DC

## 2017-09-24 MED ORDER — DULOXETINE HCL 30 MG PO CPEP: 30.0000 mg | ORAL_CAPSULE | Freq: Two times a day (BID) | ORAL | 0 refills | Status: DC

## 2017-09-24 MED FILL — ROSUVASTATIN CALCIUM 5 MG T: 5 | 90 days supply | Qty: 90 | Fill #0

## 2017-09-24 MED FILL — VIT D2 1.25 MG (50,000 UNIT: 1.25 MG | 30 days supply | Qty: 10 | Fill #0

## 2017-09-24 NOTE — Progress Notes (Signed)
Office: [phone]  /  Fax: [phone]   HPI:   Chief Complaint: OBESITY Alice Kemp is here to discuss her progress with her obesity treatment plan. She is on the Category 3 plan and is following her eating plan approximately 60 % of the time. She states she is exercising 5 minutes 7 times per week. Alice Kemp is doing well with weight loss and hunger is controlled. She is trying to meal prep, but is sometimes skipping meals. Alice Kemp is working on [employer] protein and decreasing liquid calories.  Her weight is 277 lb (125.6 kg) today and has had a weight loss of 9 pounds over a period of 3 weeks since her last visit. She has lost 23 lbs since starting treatment with [name].  Hyperlipidemia Alice Kemp has hyperlipidemia and has been trying to improve her cholesterol levels with intensive lifestyle modification including a low saturated fat diet, exercise and weight loss.tried to restart simvastatin, but she still noted myalgias, not improved with CoQ10  She denies any chest pain or claudication  Diabetes II Alice Kemp has a diagnosis of diabetes type II. Alice Kemp states fasting BGs range between 120 and 198 and 2 hour post prandial BGs are 265 on average. She is on Novolog 70/30 85 units qam and 55 units qhs. Alice Kemp has a history of hypoglycemia and is at higher risk of dropping too low. She has been working on intensive lifestyle modifications including diet, exercise, and weight loss to help control her blood glucose levels.  Vitamin D deficiency Alice Kemp has a diagnosis of vitamin D deficiency. She is currently stable on vit D but is not yet at goal. Alice Kemp denies nausea, vomiting or muscle weakness.  Depression with anxiety Alice Kemp is struggling with emotional eating and using food for comfort to the extent that it is negatively impacting her health. She often snacks when she is not hungry. Alice Kemp sometimes feels she is out of control and then feels guilty that she made poor food choices. She has been  working on behavior modification techniques to help reduce her emotional eating and has been somewhat successful. Her mood is stable on Cymbalta and she has decreased emotional eating. She shows no sign of suicidal or homicidal ideations.  Depression screen [facility] 2/9 07/29/2017 01/27/2017 01/08/2016  Decreased Interest 3 3 3   Down, Depressed, Hopeless 3 2 3   PHQ - 2 Score 6 5 6   Altered sleeping 1 3 0  Tired, decreased energy 3 3 3   Change in appetite 2 3 1   Feeling bad or failure about yourself  3 2 2   Trouble concentrating 1 3 0  Moving slowly or fidgety/restless 3 1 0  Suicidal thoughts 3 0 0  PHQ-9 Score 22 20 12   Difficult doing work/chores Extremely dIfficult Somewhat difficult Somewhat difficult     ALLERGIES: Allergies  Allergen Reactions  . Sulfa Antibiotics   . Penicillins Rash    MEDICATIONS: Current Outpatient Medications on File Prior to Visit  Medication Sig Dispense Refill  . allopurinol (ZYLOPRIM) 100 MG tablet Take 1 tablet (100 mg total) by mouth daily. 90 tablet 3  . Alpha-Lipoic Acid 100 MG CAPS Take by mouth.    Marland Kitchen amitriptyline (ELAVIL) 10 MG tablet   5  . aspirin 81 MG tablet Take 81 mg by mouth daily. Reported on 12/24/2015    . celecoxib (CELEBREX) 100 MG capsule Take 1 capsule (100 mg total) by mouth daily as needed. Yearly physical w/labs due in August must see MD for future refills 90 capsule  0  . Coenzyme Q10 (EQL COQ10) 300 MG CAPS Take 1 capsule (300 mg total) daily at 12 noon by mouth. 30 capsule 0  . colchicine 0.6 MG tablet TAKE 2 TABLETS BY MOUTH AT ONSET OF FLARE THEN TAKE 1 TABLET 1 HOUR LATER 15 tablet 0  . DULoxetine (CYMBALTA) 30 MG capsule Take 1 capsule (30 mg total) by mouth 2 (two) times daily. 60 capsule 0  . fluticasone (CUTIVATE) 0.05 % cream APPLY 3 TIMES A DAY AS NEEDED FOR ITCHING 60 g 1  . furosemide (LASIX) 80 MG tablet Take 1 tablet (80 mg total) by mouth daily. 90 tablet 3  . gabapentin (NEURONTIN) 300 MG capsule     .  levocetirizine (XYZAL) 5 MG tablet TAKE 1 TABLET BY MOUTH EVERY EVENING 90 tablet 3  . levothyroxine (SYNTHROID, LEVOTHROID) 88 MCG tablet Take 88 mcg by mouth daily before breakfast.    . losartan (COZAAR) 25 MG tablet TAKE 1 TABLET BY MOUTH ONCE DAILY 90 tablet 3  . Magnesium 100 MG CAPS Take 2 capsules by mouth daily.    Marland Kitchen NOVOLOG MIX 70/30 FLEXPEN (70-30) 100 UNIT/ML FlexPen Inject 85 units with breakfast, 60 units with lunch and 85 units with dinner. 225 mL 3  . ONETOUCH VERIO test strip   3  . spironolactone (ALDACTONE) 25 MG tablet TAKE 1 TABLET (25 MG TOTAL) BY MOUTH DAILY. 90 tablet 3  . traMADol (ULTRAM) 50 MG tablet Take by mouth every 12 (twelve) hours as needed.    . vitamin B-12 (CYANOCOBALAMIN) 500 MCG tablet Take 500 mcg by mouth 2 (two) times daily.    . Vitamin D, Ergocalciferol, (DRISDOL) 50000 units CAPS capsule Take 1 capsule (50,000 Units total) every 3 (three) days by mouth. 10 capsule 0   No current facility-administered medications on file prior to visit.     PAST MEDICAL HISTORY: Past Medical History:  Diagnosis Date  . Anxiety   . Arthritis   . Back pain   . Benign neoplasm of colon 06/29/2012  . Breast abscess 07/2011   left; s/p I&D + abx by gen surg  . Cervical radiculopathy at C6   . CKD (chronic kidney disease) stage 3, GFR 30-59 ml/min (HCC)    follows with renal  . Constipation   . Depression   . Diabetes mellitus type II   . Diastolic heart failure ([place]) 09/2013 echo  . Gall bladder inflammation   . Glaucoma   . Gout   . Hyperlipidemia   . Hypothyroidism   . Joint pain   . Kidney disease   . Leg pain   . Morbid obesity ([place])   . OBSTRUCTIVE SLEEP APNEA 12/23/2010   npsg 2012:  AHI 25/hr, severe desat to 68%.   autoset 2013: optimal pressure 13cm   . PERICARDIAL EFFUSION 12/12/2010  . Pulmonary hypertension ([place])   . Shortness of breath   . Skin lesion of breast 07/09/2012  . Swelling of extremity   . Unspecified hypothyroidism   . Vitamin  B12 deficiency   . Vitamin D deficiency     PAST SURGICAL HISTORY: Past Surgical History:  Procedure Laterality Date  . BREAST BIOPSY  1990   Left  . CATARACT EXTRACTION    . COLONOSCOPY  06/29/2012   Procedure: COLONOSCOPY;  Surgeon: Ladene Artist, MD,FACG;  Location: [facility];  Service: Endoscopy;  Laterality: N/A;  . SHOULDER SURGERY  2010   Left  . TONSILLECTOMY  1964    SOCIAL HISTORY: Social  History   Tobacco Use  . Smoking status: Never Smoker  . Smokeless tobacco: Never Used  Substance Use Topics  . Alcohol use: No    Alcohol/week: 0.0 oz  . Drug use: No    FAMILY HISTORY: Family History  Problem Relation Age of Onset  . Hypertension Mother   . Heart disease Mother   . Heart attack Mother   . Thyroid disease Mother   . Diabetes Father   . Heart disease Father   . Skin cancer Father   . Cancer Father        skin  . Heart attack Father   . Hyperlipidemia Father   . Colon polyps Father   . Depression Father   . Hypertension Brother   . Skin cancer Paternal Grandfather   . Colon cancer Paternal Grandfather 28  . Breast cancer Paternal Aunt   . Cancer Paternal Aunt        breast  . Diabetes Paternal Uncle   . Diabetes Maternal Grandfather   . Sudden death Neg Hx     ROS: Review of Systems  Constitutional: Positive for weight loss.  Cardiovascular: Negative for chest pain and claudication.  Gastrointestinal: Negative for nausea and vomiting.  Musculoskeletal: Positive for myalgias.       Negative muscle weakness  Psychiatric/Behavioral: Positive for depression. Negative for suicidal ideas. The patient is nervous/anxious (anxiety).     PHYSICAL EXAM: Blood pressure 119/73, pulse 98, temperature 97.8 F (36.6 C), temperature source Oral, height 5\' 3"  (1.6 m), weight 277 lb (125.6 kg), SpO2 95 %. Body mass index is 49.07 kg/m. Physical Exam  Constitutional: She is oriented to person, place, and time. She appears well-developed and  well-nourished.  Cardiovascular: Normal rate.  Pulmonary/Chest: Effort normal.  Musculoskeletal: Normal range of motion.  Neurological: She is oriented to person, place, and time.  Skin: Skin is warm and dry.  Psychiatric: She has a normal mood and affect. Her behavior is normal.  Vitals reviewed.   RECENT LABS AND TESTS: BMET    Component Value Date/Time   NA 138 07/30/2017 1020   K 4.8 07/30/2017 1020   CL 100 07/30/2017 1020   CO2 23 07/30/2017 1020   GLUCOSE 259 (H) 07/30/2017 1020   GLUCOSE 130 (H) 01/08/2016 1649   BUN 27 07/30/2017 1020   CREATININE 1.83 (H) 07/30/2017 1020   CALCIUM 9.3 07/30/2017 1020   GFRNONAA 28 (L) 07/30/2017 1020   GFRAA 32 (L) 07/30/2017 1020   Lab Results  Component Value Date   HGBA1C 7.9 (H) 07/30/2017   HGBA1C 8.1 (H) 04/10/2015   HGBA1C 10.0 (H) 04/11/2014   HGBA1C 7.6 (A) 09/02/2013   HGBA1C 8.9 (H) 08/01/2013   Lab Results  Component Value Date   INSULIN 37.2 (H) 07/30/2017   CBC    Component Value Date/Time   WBC 7.9 07/30/2017 1020   WBC 8.9 09/23/2013 1149   RBC 4.27 07/30/2017 1020   RBC 4.58 09/23/2013 1149   HGB 13.2 07/30/2017 1020   HCT 40.3 07/30/2017 1020   PLT 213 12/15/2016   MCV 94 07/30/2017 1020   MCH 30.9 07/30/2017 1020   MCH 28.1 01/29/2011 0500   MCHC 32.8 07/30/2017 1020   MCHC 33.0 09/23/2013 1149   RDW 13.5 07/30/2017 1020   LYMPHSABS 4.1 (H) 07/30/2017 1020   MONOABS 0.6 09/23/2013 1149   EOSABS 0.3 07/30/2017 1020   BASOSABS 0.0 07/30/2017 1020   Iron/TIBC/Ferritin/ %Sat No results found for: IRON, TIBC, FERRITIN, IRONPCTSAT  Lipid Panel     Component Value Date/Time   CHOL 250 (H) 07/30/2017 1020   TRIG 379 (H) 07/30/2017 1020   HDL 31 (L) 07/30/2017 1020   CHOLHDL 4 04/10/2015 1212   VLDL 38.8 04/10/2015 1212   LDLCALC 143 (H) 07/30/2017 1020   LDLDIRECT 54.6 05/10/2012 1440   Hepatic Function Panel     Component Value Date/Time   PROT 7.1 07/30/2017 1020   ALBUMIN 3.9  07/30/2017 1020   AST 18 07/30/2017 1020   ALT 17 07/30/2017 1020   ALKPHOS 70 07/30/2017 1020   BILITOT 0.3 07/30/2017 1020   BILIDIR 0.1 05/10/2012 1440      Component Value Date/Time   TSH 2.610 07/30/2017 1020   TSH 1.62 04/10/2015 1212   TSH 1.65 09/23/2013 1149    ASSESSMENT AND PLAN: Other hyperlipidemia - Plan: rosuvastatin (CRESTOR) 5 MG tablet  Type 2 diabetes mellitus without complication, with long-term current use of insulin (HCC)  Vitamin D deficiency - Plan: Vitamin D, Ergocalciferol, (DRISDOL) 50000 units CAPS capsule  Class 3 severe obesity with serious comorbidity and body mass index (BMI) of 45.0 to 49.9 in adult, unspecified obesity type ([place])  Depression with anxiety  PLAN:  Hyperlipidemia Cherelle was informed of the American Heart Association Guidelines emphasizing intensive lifestyle modifications as the first line treatment for hyperlipidemia. We discussed many lifestyle modifications today in depth, and Beverlie will continue to work on decreasing saturated fats such as fatty red meat, butter and many fried foods. She will also increase vegetables and lean protein in her diet and continue to work on exercise and weight loss efforts. Adilenne agrees to discontinue simvastatin and start Crestor 5 mg qd #30 with no refills and continue CoQ10 300 mg qd and will follow up as directed.  Diabetes II Chaniah has been given extensive diabetes education by myself today including ideal fasting and post-prandial blood glucose readings, individual ideal Hgb A1c goals  and hypoglycemia prevention. We discussed the importance of good blood sugar control to decrease the likelihood of diabetic complications such as nephropathy, neuropathy, limb loss, blindness, coronary artery disease, and death. We discussed the importance of intensive lifestyle modification including diet, exercise and weight loss as the first line treatment for diabetes. Maegan agrees to continue Novolog 70/30 as  prescribed and will follow up at the agreed upon time.  Vitamin D Deficiency Maleta was informed that low vitamin D levels contributes to fatigue and are associated with obesity, breast, and colon cancer. She agrees to continue to take prescription Vit D @50 ,000 IU every 3 days #10 with no refills and will follow up for routine testing of vitamin D, at least 2-3 times per year. She was informed of the risk of over-replacement of vitamin D and agrees to not increase her dose unless he discusses this with [name] first. Dynastee agrees to follow up with our clinic in 3 weeks.  Depression with Anxiety We discussed behavior modification techniques today to help Oktober deal with her emotional eating and depression. She has agreed to take Cymbalta 30 mg bid #60 with no refills and will follow up as directed.  Obesity Joselin is currently in the action stage of change. As such, her goal is to continue with weight loss efforts She has agreed to follow the Category 3 plan Adlai has been instructed to work up to a goal of 150 minutes of combined cardio and strengthening exercise per week for weight loss and overall health benefits. We discussed the following Behavioral  Modification Strategies today: no skipping meals, decreasing simple carbohydrates , holiday eating strategies  and decrease liquid calories  Catherene has agreed to follow up with our clinic in 3 weeks. She was informed of the importance of frequent follow up visits to maximize her success with intensive lifestyle modifications for her multiple health conditions.  I, Doreene Nest, am acting as transcriptionist for Dennard Nip, MD  I have reviewed the above documentation for accuracy and completeness, and I agree with the above. -Dennard Nip, MD    OBESITY BEHAVIORAL INTERVENTION VISIT  Today's visit was # 5 out of 22.  Starting weight: 300 lbs Starting date: 07/29/17 Today's weight : 277 lbs Today's date: 09/22/2017 Total lbs lost to  date: 23 (Patients must lose 7 lbs in the first 6 months to continue with counseling)   ASK: We discussed the diagnosis of obesity with Ed Blalock today and Modena agreed to give [name] permission to discuss obesity behavioral modification therapy today.  ASSESS: Adriyanna has the diagnosis of obesity and her BMI today is 49.08 Rayaan is in the action stage of change   ADVISE: Shonica was educated on the multiple health risks of obesity as well as the benefit of weight loss to improve her health. She was advised of the need for long term treatment and the importance of lifestyle modifications.  AGREE: Multiple dietary modification options and treatment options were discussed and  Saida agreed to follow the Category 3 plan We discussed the following Behavioral Modification Strategies today: no skipping meals, decreasing simple carbohydrates , holiday eating strategies and decrease liquid calories

## 2017-10-12 DIAGNOSIS — N189 Chronic kidney disease, unspecified: Secondary | ICD-10-CM | POA: Diagnosis not present

## 2017-10-12 DIAGNOSIS — E7800 Pure hypercholesterolemia, unspecified: Secondary | ICD-10-CM | POA: Diagnosis not present

## 2017-10-12 DIAGNOSIS — E1140 Type 2 diabetes mellitus with diabetic neuropathy, unspecified: Secondary | ICD-10-CM | POA: Diagnosis not present

## 2017-10-12 DIAGNOSIS — R809 Proteinuria, unspecified: Secondary | ICD-10-CM | POA: Diagnosis not present

## 2017-10-12 DIAGNOSIS — E11319 Type 2 diabetes mellitus with unspecified diabetic retinopathy without macular edema: Secondary | ICD-10-CM | POA: Diagnosis not present

## 2017-10-12 DIAGNOSIS — E1165 Type 2 diabetes mellitus with hyperglycemia: Secondary | ICD-10-CM | POA: Diagnosis not present

## 2017-10-12 DIAGNOSIS — E039 Hypothyroidism, unspecified: Secondary | ICD-10-CM | POA: Diagnosis not present

## 2017-10-12 DIAGNOSIS — I10 Essential (primary) hypertension: Secondary | ICD-10-CM | POA: Diagnosis not present

## 2017-10-14 ENCOUNTER — Ambulatory Visit (INDEPENDENT_AMBULATORY_CARE_PROVIDER_SITE_OTHER): Payer: PPO | Admitting: Family Medicine

## 2017-10-14 VITALS — BP 95/65 | HR 87 | Temp 97.7°F | Ht 63.0 in | Wt 277.0 lb

## 2017-10-14 DIAGNOSIS — Z6841 Body Mass Index (BMI) 40.0 and over, adult: Secondary | ICD-10-CM

## 2017-10-14 DIAGNOSIS — E119 Type 2 diabetes mellitus without complications: Secondary | ICD-10-CM | POA: Diagnosis not present

## 2017-10-14 DIAGNOSIS — I10 Essential (primary) hypertension: Secondary | ICD-10-CM

## 2017-10-15 DIAGNOSIS — I10 Essential (primary) hypertension: Secondary | ICD-10-CM | POA: Insufficient documentation

## 2017-10-15 DIAGNOSIS — E119 Type 2 diabetes mellitus without complications: Secondary | ICD-10-CM | POA: Insufficient documentation

## 2017-10-15 NOTE — Progress Notes (Signed)
Office: [phone]  /  Fax: [phone]   HPI:   Chief Complaint: OBESITY Alice Kemp is here to discuss her progress with her obesity treatment plan. She is on the Category 3 plan and is following her eating plan approximately 30 % of the time. She states she is exercising 0 minutes 0 times per week. Alice Kemp has done well maintaining her weight over the holidays. She has had increase financial worries and may lose her home soon if she doesn't find at least a part time job. She admits to struggling to buy her food.  Her weight is 277 lb (125.6 kg) today and has not lost weight since her last visit. She has lost 23 lbs since starting treatment with [name].  Hypertension Alice Kemp is a 67 y.o. female with hypertension. Alice Kemp's blood pressure dropped to 96/65, normally 115/60's. She denies lightheadedness or recent falls. She is working weight loss to help control her blood pressure with the goal of decreasing her risk of heart attack and stroke. Alice Kemp's blood pressure is not currently controlled.  Diabetes II Alice Kemp has a diagnosis of diabetes type II. Alice Kemp is followed by Endocrinologist and has been uncontrolled over the holidays with fasting BGs in 200's. her Endocrinologist increased her 70/30 to 85-65-65 from 85-55. Her blood pressure dropped while in office today and BGs was 83. Alice Kemp is not checking BGs at home recently. She was given a granola protein bar to help increase glucose reading. She left without rechecking her BGs, saying she felt well. She has been working on intensive lifestyle modifications including diet, exercise, and weight loss to help control her blood glucose levels.  ALLERGIES: Allergies  Allergen Reactions  . Sulfa Antibiotics   . Penicillins Rash    MEDICATIONS: Current Outpatient Medications on File Prior to Visit  Medication Sig Dispense Refill  . allopurinol (ZYLOPRIM) 100 MG tablet Take 1 tablet (100 mg total) by mouth daily. 90 tablet 3  .  Alpha-Lipoic Acid 100 MG CAPS Take by mouth.    Marland Kitchen amitriptyline (ELAVIL) 10 MG tablet   5  . aspirin 81 MG tablet Take 81 mg by mouth daily. Reported on 12/24/2015    . celecoxib (CELEBREX) 100 MG capsule Take 1 capsule (100 mg total) by mouth daily as needed. Yearly physical w/labs due in August must see MD for future refills 90 capsule 0  . Coenzyme Q10 (EQL COQ10) 300 MG CAPS Take 1 capsule (300 mg total) daily at 12 noon by mouth. 30 capsule 0  . colchicine 0.6 MG tablet TAKE 2 TABLETS BY MOUTH AT ONSET OF FLARE THEN TAKE 1 TABLET 1 HOUR LATER 15 tablet 0  . DULoxetine (CYMBALTA) 30 MG capsule Take 1 capsule (30 mg total) by mouth 2 (two) times daily. 60 capsule 0  . fluticasone (CUTIVATE) 0.05 % cream APPLY 3 TIMES A DAY AS NEEDED FOR ITCHING 60 g 1  . furosemide (LASIX) 80 MG tablet Take 1 tablet (80 mg total) by mouth daily. 90 tablet 3  . gabapentin (NEURONTIN) 300 MG capsule     . levocetirizine (XYZAL) 5 MG tablet TAKE 1 TABLET BY MOUTH EVERY EVENING 90 tablet 3  . levothyroxine (SYNTHROID, LEVOTHROID) 88 MCG tablet Take 88 mcg by mouth daily before breakfast.    . losartan (COZAAR) 25 MG tablet TAKE 1 TABLET BY MOUTH ONCE DAILY 90 tablet 3  . Magnesium 100 MG CAPS Take 2 capsules by mouth daily.    Marland Kitchen NOVOLOG MIX 70/30 FLEXPEN (70-30) 100 UNIT/ML  FlexPen Inject 85 units with breakfast, 60 units with lunch and 85 units with dinner. 225 mL 3  . ONETOUCH VERIO test strip   3  . rosuvastatin (CRESTOR) 5 MG tablet Take 1 tablet (5 mg total) by mouth daily. 90 tablet 3  . spironolactone (ALDACTONE) 25 MG tablet TAKE 1 TABLET (25 MG TOTAL) BY MOUTH DAILY. 90 tablet 3  . traMADol (ULTRAM) 50 MG tablet Take by mouth every 12 (twelve) hours as needed.    . vitamin B-12 (CYANOCOBALAMIN) 500 MCG tablet Take 500 mcg by mouth 2 (two) times daily.    . Vitamin D, Ergocalciferol, (DRISDOL) 50000 units CAPS capsule Take 1 capsule (50,000 Units total) by mouth every 3 (three) days. 10 capsule 0   No  current facility-administered medications on file prior to visit.     PAST MEDICAL HISTORY: Past Medical History:  Diagnosis Date  . Anxiety   . Arthritis   . Back pain   . Benign neoplasm of colon 06/29/2012  . Breast abscess 07/2011   left; s/p I&D + abx by gen surg  . Cervical radiculopathy at C6   . CKD (chronic kidney disease) stage 3, GFR 30-59 ml/min (HCC)    follows with renal  . Constipation   . Depression   . Diabetes mellitus type II   . Diastolic heart failure ([place]) 09/2013 echo  . Gall bladder inflammation   . Glaucoma   . Gout   . Hyperlipidemia   . Hypothyroidism   . Joint pain   . Kidney disease   . Leg pain   . Morbid obesity ([place])   . OBSTRUCTIVE SLEEP APNEA 12/23/2010   npsg 2012:  AHI 25/hr, severe desat to 68%.   autoset 2013: optimal pressure 13cm   . PERICARDIAL EFFUSION 12/12/2010  . Pulmonary hypertension ([place])   . Shortness of breath   . Skin lesion of breast 07/09/2012  . Swelling of extremity   . Unspecified hypothyroidism   . Vitamin B12 deficiency   . Vitamin D deficiency     PAST SURGICAL HISTORY: Past Surgical History:  Procedure Laterality Date  . BREAST BIOPSY  1990   Left  . CATARACT EXTRACTION    . COLONOSCOPY  06/29/2012   Procedure: COLONOSCOPY;  Surgeon: Ladene Artist, MD,FACG;  Location: [facility];  Service: Endoscopy;  Laterality: N/A;  . SHOULDER SURGERY  2010   Left  . TONSILLECTOMY  1964    SOCIAL HISTORY: Social History   Tobacco Use  . Smoking status: Never Smoker  . Smokeless tobacco: Never Used  Substance Use Topics  . Alcohol use: No    Alcohol/week: 0.0 oz  . Drug use: No    FAMILY HISTORY: Family History  Problem Relation Age of Onset  . Hypertension Mother   . Heart disease Mother   . Heart attack Mother   . Thyroid disease Mother   . Diabetes Father   . Heart disease Father   . Skin cancer Father   . Cancer Father        skin  . Heart attack Father   . Hyperlipidemia Father   . Colon  polyps Father   . Depression Father   . Hypertension Brother   . Skin cancer Paternal Grandfather   . Colon cancer Paternal Grandfather 25  . Breast cancer Paternal Aunt   . Cancer Paternal Aunt        breast  . Diabetes Paternal Uncle   . Diabetes Maternal Grandfather   .  Sudden death Neg Hx     ROS: Review of Systems  Constitutional: Negative for weight loss.  Neurological:       Negative lightheadedness    PHYSICAL EXAM: Blood pressure 95/65, pulse 87, temperature 97.7 F (36.5 C), temperature source Oral, height 5\' 3"  (1.6 m), weight 277 lb (125.6 kg), SpO2 93 %. Body mass index is 49.07 kg/m. Physical Exam  Constitutional: She is oriented to person, place, and time. She appears well-developed and well-nourished.  Cardiovascular: Normal rate.  Pulmonary/Chest: Effort normal.  Musculoskeletal: Normal range of motion.  Neurological: She is oriented to person, place, and time.  Skin: Skin is warm and dry.  Psychiatric: She has a normal mood and affect. Her behavior is normal.  Vitals reviewed.   RECENT LABS AND TESTS: BMET    Component Value Date/Time   NA 138 07/30/2017 1020   K 4.8 07/30/2017 1020   CL 100 07/30/2017 1020   CO2 23 07/30/2017 1020   GLUCOSE 259 (H) 07/30/2017 1020   GLUCOSE 130 (H) 01/08/2016 1649   BUN 27 07/30/2017 1020   CREATININE 1.83 (H) 07/30/2017 1020   CALCIUM 9.3 07/30/2017 1020   GFRNONAA 28 (L) 07/30/2017 1020   GFRAA 32 (L) 07/30/2017 1020   Lab Results  Component Value Date   HGBA1C 7.9 (H) 07/30/2017   HGBA1C 8.1 (H) 04/10/2015   HGBA1C 10.0 (H) 04/11/2014   HGBA1C 7.6 (A) 09/02/2013   HGBA1C 8.9 (H) 08/01/2013   Lab Results  Component Value Date   INSULIN 37.2 (H) 07/30/2017   CBC    Component Value Date/Time   WBC 7.9 07/30/2017 1020   WBC 8.9 09/23/2013 1149   RBC 4.27 07/30/2017 1020   RBC 4.58 09/23/2013 1149   HGB 13.2 07/30/2017 1020   HCT 40.3 07/30/2017 1020   PLT 213 12/15/2016   MCV 94 07/30/2017  1020   MCH 30.9 07/30/2017 1020   MCH 28.1 01/29/2011 0500   MCHC 32.8 07/30/2017 1020   MCHC 33.0 09/23/2013 1149   RDW 13.5 07/30/2017 1020   LYMPHSABS 4.1 (H) 07/30/2017 1020   MONOABS 0.6 09/23/2013 1149   EOSABS 0.3 07/30/2017 1020   BASOSABS 0.0 07/30/2017 1020   Iron/TIBC/Ferritin/ %Sat No results found for: IRON, TIBC, FERRITIN, IRONPCTSAT Lipid Panel     Component Value Date/Time   CHOL 250 (H) 07/30/2017 1020   TRIG 379 (H) 07/30/2017 1020   HDL 31 (L) 07/30/2017 1020   CHOLHDL 4 04/10/2015 1212   VLDL 38.8 04/10/2015 1212   LDLCALC 143 (H) 07/30/2017 1020   LDLDIRECT 54.6 05/10/2012 1440   Hepatic Function Panel     Component Value Date/Time   PROT 7.1 07/30/2017 1020   ALBUMIN 3.9 07/30/2017 1020   AST 18 07/30/2017 1020   ALT 17 07/30/2017 1020   ALKPHOS 70 07/30/2017 1020   BILITOT 0.3 07/30/2017 1020   BILIDIR 0.1 05/10/2012 1440      Component Value Date/Time   TSH 2.610 07/30/2017 1020   TSH 1.62 04/10/2015 1212   TSH 1.65 09/23/2013 1149    ASSESSMENT AND PLAN: Essential hypertension  Type 2 diabetes mellitus without complication, without long-term current use of insulin (HCC)  Class 3 severe obesity with serious comorbidity and body mass index (BMI) of 45.0 to 49.9 in adult, unspecified obesity type ([place])  PLAN:  Hypertension We discussed sodium restriction, working on healthy weight loss, and a regular exercise program as the means to achieve improved blood pressure control. Alice Kemp agreed with this  plan and agreed to follow up as directed. We will continue to monitor her blood pressure as well as her progress with the above lifestyle modifications. Alice Kemp declines adjusting her medications. She will increase H20 intake and recheck blood pressure in 2 weeks and let us know if she develops lightheadedness. She will continue her medications as prescribed and will watch for signs of hypotension as she continues her lifestyle modifications. Alice Kemp  agrees to follow up with our clinic in 2 weeks.  Diabetes II Alice Kemp has been given extensive diabetes education by myself today including ideal fasting and post-prandial blood glucose readings, individual ideal Hgb A1c goals and hypoglycemia prevention. We discussed the importance of good blood sugar control to decrease the likelihood of diabetic complications such as nephropathy, neuropathy, limb loss, blindness, coronary artery disease, and death. We discussed the importance of intensive lifestyle modification including diet, exercise and weight loss as the first line treatment for diabetes. Alice Kemp agrees to continue her diabetes medications as prescribed and she is to check BGs 2 to 3 times daily and bring in log to next visit in 2 weeks.   Obesity Alice Kemp is currently in the action stage of change. As such, her goal is to continue with weight loss efforts She has agreed to follow the Category 3 plan Alice Kemp has been instructed to work up to a goal of 150 minutes of combined cardio and strengthening exercise per week for weight loss and overall health benefits. We discussed the following Behavioral Modification Strategies today: increasing lean protein intake and work on meal planning and easy cooking plans, and no skipping meals We discussed ways to decrease grocery bill but still increase protein and vegetables.  Alice Kemp has agreed to follow up with our clinic in 2 weeks. She was informed of the importance of frequent follow up visits to maximize her success with intensive lifestyle modifications for her multiple health conditions.   OBESITY BEHAVIORAL INTERVENTION VISIT  Today's visit was # 6 out of 22.  Starting weight: 300 lbs Starting date: 07/29/17 Today's weight : 277 lbs  Today's date: 10/14/2017 Total lbs lost to date: 23 (Patients must lose 7 lbs in the first 6 months to continue with counseling)   ASK: We discussed the diagnosis of obesity with Alice Kemp today and Alice Kemp  agreed to give [name] permission to discuss obesity behavioral modification therapy today.  ASSESS: Besan has the diagnosis of obesity and her BMI today is 49.08 Shantara is in the action stage of change   ADVISE: Zilah was educated on the multiple health risks of obesity as well as the benefit of weight loss to improve her health. She was advised of the need for long term treatment and the importance of lifestyle modifications.  AGREE: Multiple dietary modification options and treatment options were discussed and  [place] agreed to the above obesity treatment plan.  I, Trixie Dredge, am acting as transcriptionist for Dennard Nip, MD  I have reviewed the above documentation for accuracy and completeness, and I agree with the above. -Dennard Nip, MD

## 2017-10-19 DIAGNOSIS — H5212 Myopia, left eye: Secondary | ICD-10-CM | POA: Diagnosis not present

## 2017-10-19 DIAGNOSIS — E11319 Type 2 diabetes mellitus with unspecified diabetic retinopathy without macular edema: Secondary | ICD-10-CM | POA: Diagnosis not present

## 2017-10-19 DIAGNOSIS — H40023 Open angle with borderline findings, high risk, bilateral: Secondary | ICD-10-CM | POA: Diagnosis not present

## 2017-10-29 ENCOUNTER — Ambulatory Visit (INDEPENDENT_AMBULATORY_CARE_PROVIDER_SITE_OTHER): Payer: PPO | Admitting: Family Medicine

## 2017-10-29 VITALS — BP 128/74 | HR 78 | Temp 98.0°F | Ht 63.0 in | Wt 276.0 lb

## 2017-10-29 DIAGNOSIS — Z794 Long term (current) use of insulin: Secondary | ICD-10-CM | POA: Diagnosis not present

## 2017-10-29 DIAGNOSIS — Z6841 Body Mass Index (BMI) 40.0 and over, adult: Secondary | ICD-10-CM

## 2017-10-29 DIAGNOSIS — F3289 Other specified depressive episodes: Secondary | ICD-10-CM | POA: Diagnosis not present

## 2017-10-29 DIAGNOSIS — E119 Type 2 diabetes mellitus without complications: Secondary | ICD-10-CM | POA: Diagnosis not present

## 2017-10-29 MED ORDER — BUPROPION HCL ER (SR) 150 MG PO TB12: 150.0000 mg | ORAL_TABLET | Freq: Every day | ORAL | 0 refills | Status: DC

## 2017-10-29 NOTE — Progress Notes (Signed)
Office: [phone]  /  Fax: [phone]   HPI:   Chief Complaint: OBESITY Alice Kemp is here to discuss her progress with her obesity treatment plan. She is on the Category 3 plan and is following her eating plan approximately 80 % of the time. She states she is exercising 0 minutes 0 times per week. Alice Kemp has difficulty adhering to Category 3, traveling a good deal to see her father with Alzheimers and a friend with Cancer. She wants to lose weight quickly and feels frustrated she hasn't been successful.  Her weight is 276 lb (125.2 kg) today and has had a weight loss of 1 pound over a period of 2 weeks since her last visit. She has lost 24 lbs since starting treatment with [name].  Diabetes II Alice Kemp has a diagnosis of diabetes type II. Alice Kemp states fasting BGs range between 99 and 300's and 2 hour post prandial range between 130's and 140's. She is on 70-30 insulin and she denies any hypoglycemia but notes polyphagia especially with simple carbohydrates. Last A1c was 7.9 on 07/30/17. She has been working on intensive lifestyle modifications including diet, exercise, and weight loss to help control her blood glucose levels.  Depression with emotional eating behaviors Alice Kemp has been feeling hopeless and without purpose as well as increased fatigue. Alice Kemp struggles with emotional eating and using food for comfort to the extent that it is negatively impacting her health. She often snacks when she is not hungry. Alice Kemp sometimes feels she is out of control and then feels guilty that she made poor food choices. She has been working on behavior modification techniques to help reduce her emotional eating and has been somewhat successful. She shows no sign of suicidal or homicidal ideations and denies a history of seizures.  Depression screen Alice Kemp 2/9 07/29/2017 01/27/2017 01/08/2016  Decreased Interest 3 3 3   Down, Depressed, Hopeless 3 2 3   PHQ - 2 Score 6 5 6   Altered sleeping 1 3 0  Tired,  decreased energy 3 3 3   Change in appetite 2 3 1   Feeling bad or failure about yourself  3 2 2   Trouble concentrating 1 3 0  Moving slowly or fidgety/restless 3 1 0  Suicidal thoughts 3 0 0  PHQ-9 Score 22 20 12   Difficult doing work/chores Extremely dIfficult Somewhat difficult Somewhat difficult   ALLERGIES: Allergies  Allergen Reactions  . Sulfa Antibiotics   . Penicillins Rash    MEDICATIONS: Current Outpatient Medications on File Prior to Visit  Medication Sig Dispense Refill  . allopurinol (ZYLOPRIM) 100 MG tablet Take 1 tablet (100 mg total) by mouth daily. 90 tablet 3  . Alpha-Lipoic Acid 100 MG CAPS Take by mouth.    Marland Kitchen amitriptyline (ELAVIL) 10 MG tablet   5  . aspirin 81 MG tablet Take 81 mg by mouth daily. Reported on 12/24/2015    . celecoxib (CELEBREX) 100 MG capsule Take 1 capsule (100 mg total) by mouth daily as needed. Yearly physical w/labs due in August must see MD for future refills 90 capsule 0  . Coenzyme Q10 (EQL COQ10) 300 MG CAPS Take 1 capsule (300 mg total) daily at 12 noon by mouth. 30 capsule 0  . colchicine 0.6 MG tablet TAKE 2 TABLETS BY MOUTH AT ONSET OF FLARE THEN TAKE 1 TABLET 1 HOUR LATER 15 tablet 0  . DULoxetine (CYMBALTA) 30 MG capsule Take 1 capsule (30 mg total) by mouth 2 (two) times daily. 60 capsule 0  . fluticasone (CUTIVATE)  0.05 % cream APPLY 3 TIMES A DAY AS NEEDED FOR ITCHING 60 g 1  . furosemide (LASIX) 80 MG tablet Take 1 tablet (80 mg total) by mouth daily. 90 tablet 3  . gabapentin (NEURONTIN) 300 MG capsule     . levocetirizine (XYZAL) 5 MG tablet TAKE 1 TABLET BY MOUTH EVERY EVENING 90 tablet 3  . levothyroxine (SYNTHROID, LEVOTHROID) 88 MCG tablet Take 88 mcg by mouth daily before breakfast.    . losartan (COZAAR) 25 MG tablet TAKE 1 TABLET BY MOUTH ONCE DAILY 90 tablet 3  . Magnesium 100 MG CAPS Take 2 capsules by mouth daily.    Marland Kitchen NOVOLOG MIX 70/30 (70-30) 100 UNIT/ML injection Inject into the skin.    [name] VERIO test  strip   3  . rosuvastatin (CRESTOR) 5 MG tablet Take 1 tablet (5 mg total) by mouth daily. 90 tablet 3  . spironolactone (ALDACTONE) 25 MG tablet TAKE 1 TABLET (25 MG TOTAL) BY MOUTH DAILY. 90 tablet 3  . traMADol (ULTRAM) 50 MG tablet Take by mouth every 12 (twelve) hours as needed.    . vitamin B-12 (CYANOCOBALAMIN) 500 MCG tablet Take 500 mcg by mouth 2 (two) times daily.    . Vitamin D, Ergocalciferol, (DRISDOL) 50000 units CAPS capsule Take 1 capsule (50,000 Units total) by mouth every 3 (three) days. 10 capsule 0  . NOVOLOG MIX 70/30 FLEXPEN (70-30) 100 UNIT/ML FlexPen Inject 85 units with breakfast, 60 units with lunch and 85 units with dinner. (Patient not taking: Reported on 10/29/2017) 225 mL 3   No current facility-administered medications on file prior to visit.     PAST MEDICAL HISTORY: Past Medical History:  Diagnosis Date  . Anxiety   . Arthritis   . Back pain   . Benign neoplasm of colon 06/29/2012  . Breast abscess 07/2011   left; s/p I&D + abx by gen surg  . Cervical radiculopathy at C6   . CKD (chronic kidney disease) stage 3, GFR 30-59 ml/min (HCC)    follows with renal  . Constipation   . Depression   . Diabetes mellitus type II   . Diastolic heart failure ([place]) 09/2013 echo  . Gall bladder inflammation   . Glaucoma   . Gout   . Hyperlipidemia   . Hypothyroidism   . Joint pain   . Kidney disease   . Leg pain   . Morbid obesity ([place])   . OBSTRUCTIVE SLEEP APNEA 12/23/2010   npsg 2012:  AHI 25/hr, severe desat to 68%.   autoset 2013: optimal pressure 13cm   . PERICARDIAL EFFUSION 12/12/2010  . Pulmonary hypertension ([place])   . Shortness of breath   . Skin lesion of breast 07/09/2012  . Swelling of extremity   . Unspecified hypothyroidism   . Vitamin B12 deficiency   . Vitamin D deficiency     PAST SURGICAL HISTORY: Past Surgical History:  Procedure Laterality Date  . BREAST BIOPSY  1990   Left  . CATARACT EXTRACTION    . COLONOSCOPY  06/29/2012    Procedure: COLONOSCOPY;  Surgeon: Ladene Artist, MD,FACG;  Location: [facility];  Service: Endoscopy;  Laterality: N/A;  . SHOULDER SURGERY  2010   Left  . TONSILLECTOMY  1964    SOCIAL HISTORY: Social History   Tobacco Use  . Smoking status: Never Smoker  . Smokeless tobacco: Never Used  Substance Use Topics  . Alcohol use: No    Alcohol/week: 0.0 oz  . Drug use:  No    FAMILY HISTORY: Family History  Problem Relation Age of Onset  . Hypertension Mother   . Heart disease Mother   . Heart attack Mother   . Thyroid disease Mother   . Diabetes Father   . Heart disease Father   . Skin cancer Father   . Cancer Father        skin  . Heart attack Father   . Hyperlipidemia Father   . Colon polyps Father   . Depression Father   . Hypertension Brother   . Skin cancer Paternal Grandfather   . Colon cancer Paternal Grandfather 42  . Breast cancer Paternal Aunt   . Cancer Paternal Aunt        breast  . Diabetes Paternal Uncle   . Diabetes Maternal Grandfather   . Sudden death Neg Hx     ROS: Review of Systems  Constitutional: Positive for malaise/fatigue and weight loss.  Endo/Heme/Allergies:       Negative hypoglycemia Positive polyphagia  Psychiatric/Behavioral: Positive for depression. Negative for suicidal ideas.    PHYSICAL EXAM: Blood pressure 128/74, pulse 78, temperature 98 F (36.7 C), temperature source Oral, height 5\' 3"  (1.6 m), weight 276 lb (125.2 kg), SpO2 97 %. Body mass index is 48.89 kg/m. Physical Exam  Constitutional: She is oriented to person, place, and time. She appears well-developed and well-nourished.  Cardiovascular: Normal rate.  Pulmonary/Chest: Effort normal.  Musculoskeletal: Normal range of motion.  Neurological: She is oriented to person, place, and time.  Skin: Skin is warm and dry.  Psychiatric: She has a normal mood and affect.  Vitals reviewed.   RECENT LABS AND TESTS: BMET    Component Value Date/Time   NA 138  07/30/2017 1020   K 4.8 07/30/2017 1020   CL 100 07/30/2017 1020   CO2 23 07/30/2017 1020   GLUCOSE 259 (H) 07/30/2017 1020   GLUCOSE 130 (H) 01/08/2016 1649   BUN 27 07/30/2017 1020   CREATININE 1.83 (H) 07/30/2017 1020   CALCIUM 9.3 07/30/2017 1020   GFRNONAA 28 (L) 07/30/2017 1020   GFRAA 32 (L) 07/30/2017 1020   Lab Results  Component Value Date   HGBA1C 7.9 (H) 07/30/2017   HGBA1C 8.1 (H) 04/10/2015   HGBA1C 10.0 (H) 04/11/2014   HGBA1C 7.6 (A) 09/02/2013   HGBA1C 8.9 (H) 08/01/2013   Lab Results  Component Value Date   INSULIN 37.2 (H) 07/30/2017   CBC    Component Value Date/Time   WBC 7.9 07/30/2017 1020   WBC 8.9 09/23/2013 1149   RBC 4.27 07/30/2017 1020   RBC 4.58 09/23/2013 1149   HGB 13.2 07/30/2017 1020   HCT 40.3 07/30/2017 1020   PLT 213 12/15/2016   MCV 94 07/30/2017 1020   MCH 30.9 07/30/2017 1020   MCH 28.1 01/29/2011 0500   MCHC 32.8 07/30/2017 1020   MCHC 33.0 09/23/2013 1149   RDW 13.5 07/30/2017 1020   LYMPHSABS 4.1 (H) 07/30/2017 1020   MONOABS 0.6 09/23/2013 1149   EOSABS 0.3 07/30/2017 1020   BASOSABS 0.0 07/30/2017 1020   Iron/TIBC/Ferritin/ %Sat No results found for: IRON, TIBC, FERRITIN, IRONPCTSAT Lipid Panel     Component Value Date/Time   CHOL 250 (H) 07/30/2017 1020   TRIG 379 (H) 07/30/2017 1020   HDL 31 (L) 07/30/2017 1020   CHOLHDL 4 04/10/2015 1212   VLDL 38.8 04/10/2015 1212   LDLCALC 143 (H) 07/30/2017 1020   LDLDIRECT 54.6 05/10/2012 1440   Hepatic Function Panel  Component Value Date/Time   PROT 7.1 07/30/2017 1020   ALBUMIN 3.9 07/30/2017 1020   AST 18 07/30/2017 1020   ALT 17 07/30/2017 1020   ALKPHOS 70 07/30/2017 1020   BILITOT 0.3 07/30/2017 1020   BILIDIR 0.1 05/10/2012 1440      Component Value Date/Time   TSH 2.610 07/30/2017 1020   TSH 1.62 04/10/2015 1212   TSH 1.65 09/23/2013 1149    ASSESSMENT AND PLAN: Type 2 diabetes mellitus without complication, with long-term current use of  insulin (HCC)  Other depression - with emotional eating - Plan: buPROPion (WELLBUTRIN SR) 150 MG 12 hr tablet  Class 3 severe obesity with serious comorbidity and body mass index (BMI) of 45.0 to 49.9 in adult, unspecified obesity type ([place])  PLAN:  Diabetes II Alice Kemp has been given extensive diabetes education by myself today including ideal fasting and post-prandial blood glucose readings, individual ideal Hgb A1c goals and hypoglycemia prevention. We discussed the importance of good blood sugar control to decrease the likelihood of diabetic complications such as nephropathy, neuropathy, limb loss, blindness, coronary artery disease, and death. We discussed the importance of intensive lifestyle modification including diet, exercise and weight loss as the first line treatment for diabetes. Alice Kemp agrees to continue her checking her BGs and continue insulin 70/30 at lesser dose. Alice Kemp agrees to follow up with our clinic in 2 weeks.  Depression with Emotional Eating Behaviors We discussed behavior modification techniques today to help Alice Kemp deal with her emotional eating and depression. Alice Kemp agrees to start Wellbutrin SR 150 mg qd #30 with no refills. Alice Kemp agrees to follow up with our clinic in 2 weeks.  Obesity Alice Kemp is currently in the action stage of change. As such, her goal is to continue with weight loss efforts She has agreed to follow the Category 3 plan Alice Kemp has been instructed to work up to a goal of 150 minutes of combined cardio and strengthening exercise per week for weight loss and overall health benefits. We discussed the following Behavioral Modification Strategies today: increasing lean protein intake, decreasing simple carbohydrates, and planning for success    Alice Kemp has agreed to follow up with our clinic in 2 weeks. She was informed of the importance of frequent follow up visits to maximize her success with intensive lifestyle modifications for her multiple health  conditions.   OBESITY BEHAVIORAL INTERVENTION VISIT  Today's visit was # 7 out of 22.  Starting weight: 300 lbs Starting date: 07/29/17 Today's weight : 276 lbs  Today's date: 10/29/2017 Total lbs lost to date: 24 (Patients must lose 7 lbs in the first 6 months to continue with counseling)   ASK: We discussed the diagnosis of obesity with Alice Kemp today and Alice Kemp agreed to give [name] permission to discuss obesity behavioral modification therapy today.  ASSESS: Alice Kemp has the diagnosis of obesity and her BMI today is 25.9 Alice Kemp is in the action stage of change   ADVISE: Alice Kemp was educated on the multiple health risks of obesity as well as the benefit of weight loss to improve her health. She was advised of the need for long term treatment and the importance of lifestyle modifications.  AGREE: Multiple dietary modification options and treatment options were discussed and  Alice Kemp agreed to the above obesity treatment plan.  I, Trixie Dredge, am acting as transcriptionist for Dennard Nip, MD  I have reviewed the above documentation for accuracy and completeness, and I agree with the above. -Dennard Nip, MD

## 2017-11-12 ENCOUNTER — Ambulatory Visit (INDEPENDENT_AMBULATORY_CARE_PROVIDER_SITE_OTHER): Payer: PPO | Admitting: Physician Assistant

## 2017-11-12 ENCOUNTER — Other Ambulatory Visit (INDEPENDENT_AMBULATORY_CARE_PROVIDER_SITE_OTHER): Payer: Self-pay | Admitting: Family Medicine

## 2017-11-12 ENCOUNTER — Other Ambulatory Visit: Payer: Self-pay | Admitting: Internal Medicine

## 2017-11-12 VITALS — BP 100/64 | HR 80 | Temp 98.2°F | Ht 63.0 in | Wt 274.0 lb

## 2017-11-12 DIAGNOSIS — E559 Vitamin D deficiency, unspecified: Secondary | ICD-10-CM

## 2017-11-12 DIAGNOSIS — Z794 Long term (current) use of insulin: Secondary | ICD-10-CM

## 2017-11-12 DIAGNOSIS — Z6841 Body Mass Index (BMI) 40.0 and over, adult: Secondary | ICD-10-CM | POA: Diagnosis not present

## 2017-11-12 DIAGNOSIS — E119 Type 2 diabetes mellitus without complications: Secondary | ICD-10-CM | POA: Insufficient documentation

## 2017-11-12 DIAGNOSIS — E1129 Type 2 diabetes mellitus with other diabetic kidney complication: Secondary | ICD-10-CM

## 2017-11-12 MED FILL — LOSARTAN POTASSIUM 25 MG TA: 25 | 90 days supply | Qty: 90 | Fill #0

## 2017-11-12 MED FILL — LEVOTHYROXINE 88 MCG TABLET: 88 | 90 days supply | Qty: 90 | Fill #0

## 2017-11-12 MED FILL — COLCHICINE 0.6 MG TABS: 0.6 | 5 days supply | Qty: 15 | Fill #0

## 2017-11-12 MED FILL — FUROSEMIDE 80 MG TAB: 80 | 90 days supply | Qty: 180 | Fill #0

## 2017-11-12 MED FILL — SPIRONOLACTONE 25 MG TABLET: 25 | 90 days supply | Qty: 90 | Fill #1

## 2017-11-12 NOTE — Progress Notes (Signed)
Office: [phone]  /  Fax: [phone]   HPI:   Chief Complaint: OBESITY Alice Kemp is here to discuss her progress with her obesity treatment plan. She is on the Category 3 plan and is following her eating plan approximately 50 % of the time. She states she is exercising 0 minutes 0 times per week. Alice Kemp continues to do well with weight loss. She states she had to put her dog down and that is affecting her eating. She kips meals on occasions.  Her weight is 274 lb (124.3 kg) today and has had a weight loss of 2 pounds over a period of 2 weeks since her last visit. She has lost 26 lbs since starting treatment with [name].  Diabetes II with Stage 3 Chronic Kidney Disease Alice Kemp has a diagnosis of diabetes type II with stage 3 chronic kidney disease. Alice Kemp has not brought in BGs log in for review. She denies any hypoglycemic episodes. She states her  Insulin dosage has been recently adjusted by her doctor, and according to record review, she is on Insulin 70/30 85 units with breakfast, 60 units with lunch and 85 with dinner. Last A1c was 7.9 on 07/30/17. She has been working on intensive lifestyle modifications including diet, exercise, and weight loss to help control her blood glucose levels.  ALLERGIES: Allergies  Allergen Reactions  . Sulfa Antibiotics   . Penicillins Rash    MEDICATIONS: Current Outpatient Medications on File Prior to Visit  Medication Sig Dispense Refill  . allopurinol (ZYLOPRIM) 100 MG tablet Take 1 tablet (100 mg total) by mouth daily. 90 tablet 3  . Alpha-Lipoic Acid 100 MG CAPS Take by mouth.    Marland Kitchen amitriptyline (ELAVIL) 10 MG tablet   5  . aspirin 81 MG tablet Take 81 mg by mouth daily. Reported on 12/24/2015    . buPROPion (WELLBUTRIN SR) 150 MG 12 hr tablet Take 1 tablet (150 mg total) by mouth daily. 30 tablet 0  . celecoxib (CELEBREX) 100 MG capsule Take 1 capsule (100 mg total) by mouth daily as needed. Yearly physical w/labs due in August must see MD for  future refills 90 capsule 0  . Coenzyme Q10 (EQL COQ10) 300 MG CAPS Take 1 capsule (300 mg total) daily at 12 noon by mouth. 30 capsule 0  . colchicine 0.6 MG tablet TAKE 2 TABLETS BY MOUTH AT ONSET OF FLARE THEN TAKE 1 TABLET 1 HOUR LATER 15 tablet 0  . DULoxetine (CYMBALTA) 30 MG capsule Take 1 capsule (30 mg total) by mouth 2 (two) times daily. 60 capsule 0  . fluticasone (CUTIVATE) 0.05 % cream APPLY 3 TIMES A DAY AS NEEDED FOR ITCHING 60 g 1  . furosemide (LASIX) 80 MG tablet Take 1 tablet (80 mg total) by mouth daily. 90 tablet 3  . gabapentin (NEURONTIN) 300 MG capsule     . levocetirizine (XYZAL) 5 MG tablet TAKE 1 TABLET BY MOUTH EVERY EVENING 90 tablet 3  . levothyroxine (SYNTHROID, LEVOTHROID) 88 MCG tablet Take 88 mcg by mouth daily before breakfast.    . Magnesium 100 MG CAPS Take 2 capsules by mouth daily.    Marland Kitchen NOVOLOG MIX 70/30 (70-30) 100 UNIT/ML injection Inject into the skin.    Marland Kitchen NOVOLOG MIX 70/30 FLEXPEN (70-30) 100 UNIT/ML FlexPen Inject 85 units with breakfast, 60 units with lunch and 85 units with dinner. 225 mL 3  . ONETOUCH VERIO test strip   3  . rosuvastatin (CRESTOR) 5 MG tablet Take 1 tablet (5  mg total) by mouth daily. 90 tablet 3  . spironolactone (ALDACTONE) 25 MG tablet TAKE 1 TABLET (25 MG TOTAL) BY MOUTH DAILY. 90 tablet 3  . traMADol (ULTRAM) 50 MG tablet Take by mouth every 12 (twelve) hours as needed.    . vitamin B-12 (CYANOCOBALAMIN) 500 MCG tablet Take 500 mcg by mouth 2 (two) times daily.    . Vitamin D, Ergocalciferol, (DRISDOL) 50000 units CAPS capsule Take 1 capsule (50,000 Units total) by mouth every 3 (three) days. 10 capsule 0   No current facility-administered medications on file prior to visit.     PAST MEDICAL HISTORY: Past Medical History:  Diagnosis Date  . Anxiety   . Arthritis   . Back pain   . Benign neoplasm of colon 06/29/2012  . Breast abscess 07/2011   left; s/p I&D + abx by gen surg  . Cervical radiculopathy at C6   . CKD  (chronic kidney disease) stage 3, GFR 30-59 ml/min (HCC)    follows with renal  . Constipation   . Depression   . Diabetes mellitus type II   . Diastolic heart failure (Alice Kemp) 09/2013 echo  . Gall bladder inflammation   . Glaucoma   . Gout   . Hyperlipidemia   . Hypothyroidism   . Joint pain   . Kidney disease   . Leg pain   . Morbid obesity ([place])   . OBSTRUCTIVE SLEEP APNEA 12/23/2010   npsg 2012:  AHI 25/hr, severe desat to 68%.   autoset 2013: optimal pressure 13cm   . PERICARDIAL EFFUSION 12/12/2010  . Pulmonary hypertension ([place])   . Shortness of breath   . Skin lesion of breast 07/09/2012  . Swelling of extremity   . Unspecified hypothyroidism   . Vitamin B12 deficiency   . Vitamin D deficiency     PAST SURGICAL HISTORY: Past Surgical History:  Procedure Laterality Date  . BREAST BIOPSY  1990   Left  . CATARACT EXTRACTION    . COLONOSCOPY  06/29/2012   Procedure: COLONOSCOPY;  Surgeon: Ladene Artist, MD,FACG;  Location: [facility];  Service: Endoscopy;  Laterality: N/A;  . SHOULDER SURGERY  2010   Left  . TONSILLECTOMY  1964    SOCIAL HISTORY: Social History   Tobacco Use  . Smoking status: Never Smoker  . Smokeless tobacco: Never Used  Substance Use Topics  . Alcohol use: No    Alcohol/week: 0.0 oz  . Drug use: No    FAMILY HISTORY: Family History  Problem Relation Age of Onset  . Hypertension Mother   . Heart disease Mother   . Heart attack Mother   . Thyroid disease Mother   . Diabetes Father   . Heart disease Father   . Skin cancer Father   . Cancer Father        skin  . Heart attack Father   . Hyperlipidemia Father   . Colon polyps Father   . Depression Father   . Hypertension Brother   . Skin cancer Paternal Grandfather   . Colon cancer Paternal Grandfather 1  . Breast cancer Paternal Aunt   . Cancer Paternal Aunt        breast  . Diabetes Paternal Uncle   . Diabetes Maternal Grandfather   . Sudden death Neg Hx     ROS: Review  of Systems  Constitutional: Positive for weight loss.  Endo/Heme/Allergies:       Negative hypoglycemia    PHYSICAL EXAM: Blood pressure 100/64, pulse  80, temperature 98.2 F (36.8 C), temperature source Oral, height 5\' 3"  (1.6 m), weight 274 lb (124.3 kg), SpO2 97 %. Body mass index is 48.54 kg/m. Physical Exam  Constitutional: She is oriented to person, place, and time. She appears well-developed and well-nourished.  Cardiovascular: Normal rate.  Pulmonary/Chest: Effort normal.  Musculoskeletal: Normal range of motion.  Neurological: She is oriented to person, place, and time.  Skin: Skin is warm and dry.  Psychiatric: She has a normal mood and affect. Her behavior is normal.  Vitals reviewed.   RECENT LABS AND TESTS: BMET    Component Value Date/Time   NA 138 07/30/2017 1020   K 4.8 07/30/2017 1020   CL 100 07/30/2017 1020   CO2 23 07/30/2017 1020   GLUCOSE 259 (H) 07/30/2017 1020   GLUCOSE 130 (H) 01/08/2016 1649   BUN 27 07/30/2017 1020   CREATININE 1.83 (H) 07/30/2017 1020   CALCIUM 9.3 07/30/2017 1020   GFRNONAA 28 (L) 07/30/2017 1020   GFRAA 32 (L) 07/30/2017 1020   Lab Results  Component Value Date   HGBA1C 7.9 (H) 07/30/2017   HGBA1C 8.1 (H) 04/10/2015   HGBA1C 10.0 (H) 04/11/2014   HGBA1C 7.6 (A) 09/02/2013   HGBA1C 8.9 (H) 08/01/2013   Lab Results  Component Value Date   INSULIN 37.2 (H) 07/30/2017   CBC    Component Value Date/Time   WBC 7.9 07/30/2017 1020   WBC 8.9 09/23/2013 1149   RBC 4.27 07/30/2017 1020   RBC 4.58 09/23/2013 1149   HGB 13.2 07/30/2017 1020   HCT 40.3 07/30/2017 1020   PLT 213 12/15/2016   MCV 94 07/30/2017 1020   MCH 30.9 07/30/2017 1020   MCH 28.1 01/29/2011 0500   MCHC 32.8 07/30/2017 1020   MCHC 33.0 09/23/2013 1149   RDW 13.5 07/30/2017 1020   LYMPHSABS 4.1 (H) 07/30/2017 1020   MONOABS 0.6 09/23/2013 1149   EOSABS 0.3 07/30/2017 1020   BASOSABS 0.0 07/30/2017 1020   Iron/TIBC/Ferritin/ %Sat No results  found for: IRON, TIBC, FERRITIN, IRONPCTSAT Lipid Panel     Component Value Date/Time   CHOL 250 (H) 07/30/2017 1020   TRIG 379 (H) 07/30/2017 1020   HDL 31 (L) 07/30/2017 1020   CHOLHDL 4 04/10/2015 1212   VLDL 38.8 04/10/2015 1212   LDLCALC 143 (H) 07/30/2017 1020   LDLDIRECT 54.6 05/10/2012 1440   Hepatic Function Panel     Component Value Date/Time   PROT 7.1 07/30/2017 1020   ALBUMIN 3.9 07/30/2017 1020   AST 18 07/30/2017 1020   ALT 17 07/30/2017 1020   ALKPHOS 70 07/30/2017 1020   BILITOT 0.3 07/30/2017 1020   BILIDIR 0.1 05/10/2012 1440      Component Value Date/Time   TSH 2.610 07/30/2017 1020   TSH 1.62 04/10/2015 1212   TSH 1.65 09/23/2013 1149    ASSESSMENT AND PLAN: Type 2 diabetes mellitus with other diabetic kidney complication, with long-term current use of insulin (HCC) - Stage 3  Class 3 severe obesity with serious comorbidity and body mass index (BMI) of 45.0 to 49.9 in adult, unspecified obesity type ([place])  PLAN:  Diabetes II with Stage 3 Chronic Kidney Disease Nikyla has been given extensive diabetes education by myself today including ideal fasting and post-prandial blood glucose readings, individual ideal Hgb A1c goals and hypoglycemia prevention. We discussed the importance of good blood sugar control to decrease the likelihood of diabetic complications such as nephropathy, neuropathy, limb loss, blindness, coronary artery disease, and death.  We discussed the importance of intensive lifestyle modification including diet, exercise and weight loss as the first line treatment for diabetes. Brennley agrees to continue her diabetes medications as prescribed and she agrees to follow up with our clinic in 2 weeks.  We spent > than 50% of the 15 minute visit on the counseling as documented in the note.  Obesity Zhana is currently in the action stage of change. As such, her goal is to continue with weight loss efforts She has agreed to follow the Category 3  plan Nayzeth has been instructed to work up to a goal of 150 minutes of combined cardio and strengthening exercise per week for weight loss and overall health benefits. We discussed the following Behavioral Modification Strategies today: increasing lean protein intake and no skipping meals   Brunette has agreed to follow up with our clinic in 2 weeks. She was informed of the importance of frequent follow up visits to maximize her success with intensive lifestyle modifications for her multiple health conditions.   OBESITY BEHAVIORAL INTERVENTION VISIT  Today's visit was # 8 out of 22.  Starting weight: 300 lbs Starting date: 07/29/17 Today's weight : 274 lbs  Today's date: 11/12/2017 Total lbs lost to date: 27 (Patients must lose 7 lbs in the first 6 months to continue with counseling)   ASK: We discussed the diagnosis of obesity with Ed Blalock today and Armani agreed to give [name] permission to discuss obesity behavioral modification therapy today.  ASSESS: Clista has the diagnosis of obesity and her BMI today is 48.55 Enijah is in the action stage of change   ADVISE: Delphia was educated on the multiple health risks of obesity as well as the benefit of weight loss to improve her health. She was advised of the need for long term treatment and the importance of lifestyle modifications.  AGREE: Multiple dietary modification options and treatment options were discussed and  Jaylee agreed to the above obesity treatment plan.   Wilhemena Durie, am acting as transcriptionist for Alice Duverney, PA-C I, Alice Kemp [facility], have reviewed this note and agree with its content

## 2017-11-20 MED FILL — VIT D2 1.25 MG (50,000 UNIT: 1.25 MG | 84 days supply | Qty: 12 | Fill #2

## 2017-11-26 ENCOUNTER — Ambulatory Visit (INDEPENDENT_AMBULATORY_CARE_PROVIDER_SITE_OTHER): Payer: PPO | Admitting: Physician Assistant

## 2017-11-26 VITALS — BP 109/66 | HR 74 | Temp 98.2°F | Ht 63.0 in | Wt 271.0 lb

## 2017-11-26 DIAGNOSIS — IMO0002 Reserved for concepts with insufficient information to code with codable children: Secondary | ICD-10-CM

## 2017-11-26 DIAGNOSIS — Z6841 Body Mass Index (BMI) 40.0 and over, adult: Secondary | ICD-10-CM

## 2017-11-26 DIAGNOSIS — E1122 Type 2 diabetes mellitus with diabetic chronic kidney disease: Secondary | ICD-10-CM

## 2017-11-26 DIAGNOSIS — F3289 Other specified depressive episodes: Secondary | ICD-10-CM | POA: Diagnosis not present

## 2017-11-26 DIAGNOSIS — E1165 Type 2 diabetes mellitus with hyperglycemia: Secondary | ICD-10-CM | POA: Diagnosis not present

## 2017-11-26 DIAGNOSIS — Z794 Long term (current) use of insulin: Secondary | ICD-10-CM

## 2017-11-26 MED ORDER — BUPROPION HCL ER (SR) 200 MG PO TB12: 200.0000 mg | ORAL_TABLET | Freq: Every day | ORAL | 0 refills | Status: DC

## 2017-11-26 NOTE — Progress Notes (Signed)
Office: [phone]  /  Fax: [phone]   HPI:   Chief Complaint: OBESITY Alice Kemp is here to discuss her progress with her obesity treatment plan. She is on the Category 3 plan and is following her eating plan approximately 60 % of the time. She states she is walking 15 minutes 3 times per week. Alice Kemp continues to do well with weight loss. She continues to struggle emotionally, after putting her dog down. Alice Kemp states she thinks she should lose more weight, faster. She is inquiring about other weight loss medications. Also, pt requests to see Dr. Leafy Ro for future visits. Her weight is 271 lb (122.9 kg) today and has had a weight loss of 3 pounds over a period of 2 weeks since her last visit. She has lost 29 lbs since starting treatment with [name].  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 Alice Kemp states BGs range between 120's and 130's, post prandial range in the 170's. She admits to an episode of hypoglycemia when she felt tired, however, she did not check her BG. Alice Kemp is prescribed novolog 70/30  85/60/85 units with breakfast, lunch, and dinner, respectively. However, she takes it differently, and injects 85 u at breakfast, 55 u at lunch, and 55 units at dinner. Alice Kemp is strongly advised on taking her insulin as prescribed.  She is informed that increase in postprandial BG are related to her taking less insulin at nigh. She has been working on intensive lifestyle modifications including diet, exercise, and weight loss to help control her blood glucose levels.   Depression with emotional eating behaviors Alice Kemp is struggling with emotional eating and using food for comfort to the extent that it is negatively impacting her health. She often snacks when she is not hungry. Alice Kemp sometimes feels she is out of control and then feels guilty that she made poor food choices. She has been working on behavior modification techniques to help reduce  her emotional eating and has been somewhat successful. Her mood is stable and she shows no sign of suicidal or homicidal ideations.  Depression screen Alice Kemp 2/9 07/29/2017 01/27/2017 01/08/2016  Decreased Interest 3 3 3   Down, Depressed, Hopeless 3 2 3   PHQ - 2 Score 6 5 6   Altered sleeping 1 3 0  Tired, decreased energy 3 3 3   Change in appetite 2 3 1   Feeling bad or failure about yourself  3 2 2   Trouble concentrating 1 3 0  Moving slowly or fidgety/restless 3 1 0  Suicidal thoughts 3 0 0  PHQ-9 Score 22 20 12   Difficult doing work/chores Extremely dIfficult Somewhat difficult Somewhat difficult     ALLERGIES: Allergies  Allergen Reactions  . Sulfa Antibiotics   . Penicillins Rash    MEDICATIONS: Current Outpatient Medications on File Prior to Visit  Medication Sig Dispense Refill  . allopurinol (ZYLOPRIM) 100 MG tablet Take 1 tablet (100 mg total) by mouth daily. 90 tablet 3  . Alpha-Lipoic Acid 100 MG CAPS Take by mouth.    Marland Kitchen amitriptyline (ELAVIL) 10 MG tablet   5  . aspirin 81 MG tablet Take 81 mg by mouth daily. Reported on 12/24/2015    . celecoxib (CELEBREX) 100 MG capsule Take 1 capsule (100 mg total) by mouth daily as needed. Yearly physical w/labs due in August must see MD for future refills 90 capsule 0  . Coenzyme Q10 (EQL COQ10) 300 MG CAPS Take 1 capsule (300 mg total) daily at  12 noon by mouth. 30 capsule 0  . colchicine 0.6 MG tablet TAKE 2 TABLETS BY MOUTH AT ONSET OF FLARE THEN TAKE 1 TABLET 1 HOUR LATER 15 tablet 0  . DULoxetine (CYMBALTA) 30 MG capsule Take 1 capsule (30 mg total) by mouth 2 (two) times daily. 60 capsule 0  . fluticasone (CUTIVATE) 0.05 % cream APPLY 3 TIMES A DAY AS NEEDED FOR ITCHING 60 g 1  . furosemide (LASIX) 80 MG tablet Take 1 tablet (80 mg total) by mouth daily. 90 tablet 3  . gabapentin (NEURONTIN) 300 MG capsule     . levocetirizine (XYZAL) 5 MG tablet TAKE 1 TABLET BY MOUTH EVERY EVENING 90 tablet 3  . levothyroxine (SYNTHROID,  LEVOTHROID) 88 MCG tablet Take 88 mcg by mouth daily before breakfast.    . losartan (COZAAR) 25 MG tablet Take 1 tablet (25 mg total) by mouth daily. -- Office visit needed for further refills 90 tablet 0  . Magnesium 100 MG CAPS Take 2 capsules by mouth daily.    Marland Kitchen NOVOLOG MIX 70/30 (70-30) 100 UNIT/ML injection Inject into the skin.    Marland Kitchen NOVOLOG MIX 70/30 FLEXPEN (70-30) 100 UNIT/ML FlexPen Inject 85 units with breakfast, 60 units with lunch and 85 units with dinner. 225 mL 3  . ONETOUCH VERIO test strip   3  . rosuvastatin (CRESTOR) 5 MG tablet Take 1 tablet (5 mg total) by mouth daily. 90 tablet 3  . spironolactone (ALDACTONE) 25 MG tablet TAKE 1 TABLET (25 MG TOTAL) BY MOUTH DAILY. 90 tablet 3  . traMADol (ULTRAM) 50 MG tablet Take by mouth every 12 (twelve) hours as needed.    . vitamin B-12 (CYANOCOBALAMIN) 500 MCG tablet Take 500 mcg by mouth 2 (two) times daily.    . Vitamin D, Ergocalciferol, (DRISDOL) 50000 units CAPS capsule Take 1 capsule (50,000 Units total) by mouth every 3 (three) days. 10 capsule 0   No current facility-administered medications on file prior to visit.     PAST MEDICAL HISTORY: Past Medical History:  Diagnosis Date  . Anxiety   . Arthritis   . Back pain   . Benign neoplasm of colon 06/29/2012  . Breast abscess 07/2011   left; s/p I&D + abx by gen surg  . Cervical radiculopathy at C6   . CKD (chronic kidney disease) stage 3, GFR 30-59 ml/min (HCC)    follows with renal  . Constipation   . Depression   . Diabetes mellitus type II   . Diastolic heart failure ([place]) 09/2013 echo  . Gall bladder inflammation   . Glaucoma   . Gout   . Hyperlipidemia   . Hypothyroidism   . Joint pain   . Kidney disease   . Leg pain   . Morbid obesity ([place])   . OBSTRUCTIVE SLEEP APNEA 12/23/2010   npsg 2012:  AHI 25/hr, severe desat to 68%.   autoset 2013: optimal pressure 13cm   . PERICARDIAL EFFUSION 12/12/2010  . Pulmonary hypertension ([place])   . Shortness of breath    . Skin lesion of breast 07/09/2012  . Swelling of extremity   . Unspecified hypothyroidism   . Vitamin B12 deficiency   . Vitamin D deficiency     PAST SURGICAL HISTORY: Past Surgical History:  Procedure Laterality Date  . BREAST BIOPSY  1990   Left  . CATARACT EXTRACTION    . COLONOSCOPY  06/29/2012   Procedure: COLONOSCOPY;  Surgeon: Ladene Artist, MD,FACG;  Location: [facility];  Service: Endoscopy;  Laterality: N/A;  . SHOULDER SURGERY  2010   Left  . TONSILLECTOMY  1964    SOCIAL HISTORY: Social History   Tobacco Use  . Smoking status: Never Smoker  . Smokeless tobacco: Never Used  Substance Use Topics  . Alcohol use: No    Alcohol/week: 0.0 oz  . Drug use: No    FAMILY HISTORY: Family History  Problem Relation Age of Onset  . Hypertension Mother   . Heart disease Mother   . Heart attack Mother   . Thyroid disease Mother   . Diabetes Father   . Heart disease Father   . Skin cancer Father   . Cancer Father        skin  . Heart attack Father   . Hyperlipidemia Father   . Colon polyps Father   . Depression Father   . Hypertension Brother   . Skin cancer Paternal Grandfather   . Colon cancer Paternal Grandfather 98  . Breast cancer Paternal Aunt   . Cancer Paternal Aunt        breast  . Diabetes Paternal Uncle   . Diabetes Maternal Grandfather   . Sudden death Neg Hx     ROS: Review of Systems  Constitutional: Positive for weight loss.  Endo/Heme/Allergies:       Positive for hypoglycemia    PHYSICAL EXAM: Blood pressure 109/66, pulse 74, temperature 98.2 F (36.8 C), temperature source Oral, height 5\' 3"  (1.6 m), weight 271 lb (122.9 kg), SpO2 98 %. Body mass index is 48.01 kg/m. Physical Exam  Constitutional: She is oriented to person, place, and time. She appears well-developed and well-nourished.  Cardiovascular: Normal rate.  Pulmonary/Chest: Effort normal.  Musculoskeletal: Normal range of motion.  Neurological: She is oriented to  person, place, and time.  Skin: Skin is warm and dry.  Psychiatric: She has a normal mood and affect. Her behavior is normal.  Vitals reviewed.   RECENT LABS AND TESTS: BMET    Component Value Date/Time   NA 138 07/30/2017 1020   K 4.8 07/30/2017 1020   CL 100 07/30/2017 1020   CO2 23 07/30/2017 1020   GLUCOSE 259 (H) 07/30/2017 1020   GLUCOSE 130 (H) 01/08/2016 1649   BUN 27 07/30/2017 1020   CREATININE 1.83 (H) 07/30/2017 1020   CALCIUM 9.3 07/30/2017 1020   GFRNONAA 28 (L) 07/30/2017 1020   GFRAA 32 (L) 07/30/2017 1020   Lab Results  Component Value Date   HGBA1C 7.9 (H) 07/30/2017   HGBA1C 8.1 (H) 04/10/2015   HGBA1C 10.0 (H) 04/11/2014   HGBA1C 7.6 (A) 09/02/2013   HGBA1C 8.9 (H) 08/01/2013   Lab Results  Component Value Date   INSULIN 37.2 (H) 07/30/2017   CBC    Component Value Date/Time   WBC 7.9 07/30/2017 1020   WBC 8.9 09/23/2013 1149   RBC 4.27 07/30/2017 1020   RBC 4.58 09/23/2013 1149   HGB 13.2 07/30/2017 1020   HCT 40.3 07/30/2017 1020   PLT 213 12/15/2016   MCV 94 07/30/2017 1020   MCH 30.9 07/30/2017 1020   MCH 28.1 01/29/2011 0500   MCHC 32.8 07/30/2017 1020   MCHC 33.0 09/23/2013 1149   RDW 13.5 07/30/2017 1020   LYMPHSABS 4.1 (H) 07/30/2017 1020   MONOABS 0.6 09/23/2013 1149   EOSABS 0.3 07/30/2017 1020   BASOSABS 0.0 07/30/2017 1020   Iron/TIBC/Ferritin/ %Sat No results found for: IRON, TIBC, FERRITIN, IRONPCTSAT Lipid Panel     Component Value  Date/Time   CHOL 250 (H) 07/30/2017 1020   TRIG 379 (H) 07/30/2017 1020   HDL 31 (L) 07/30/2017 1020   CHOLHDL 4 04/10/2015 1212   VLDL 38.8 04/10/2015 1212   LDLCALC 143 (H) 07/30/2017 1020   LDLDIRECT 54.6 05/10/2012 1440   Hepatic Function Panel     Component Value Date/Time   PROT 7.1 07/30/2017 1020   ALBUMIN 3.9 07/30/2017 1020   AST 18 07/30/2017 1020   ALT 17 07/30/2017 1020   ALKPHOS 70 07/30/2017 1020   BILITOT 0.3 07/30/2017 1020   BILIDIR 0.1 05/10/2012 1440        Component Value Date/Time   TSH 2.610 07/30/2017 1020   TSH 1.62 04/10/2015 1212   TSH 1.65 09/23/2013 1149    ASSESSMENT AND PLAN: Type 2 diabetes mellitus with stage 3 chronic kidney disease, with long-term current use of insulin (HCC)  Other depression - with emotional eating  - Plan: buPROPion (WELLBUTRIN SR) 200 MG 12 hr tablet  Class 3 severe obesity with serious comorbidity and body mass index (BMI) of 45.0 to 49.9 in adult, unspecified obesity type ([place])  PLAN:  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 We discussed the importance of good blood sugar control to decrease the likelihood of diabetic complications such as nephropathy, neuropathy, limb loss, blindness, coronary artery disease, and death. We discussed the importance of intensive lifestyle modification including diet, exercise and weight loss as the first line treatment for diabetes. Alice Kemp is advised against adjusting her Insulin dosage on her own, and to take her insulin as prescribed. She is advised to follow up with her Endocrinologist as suggested. She in encouraged to keep a tight BG log and bring in for review for further evaluation. She agrees to continue her diabetes medications and will follow up at the agreed upon time.  Depression with Emotional Eating Behaviors We discussed behavior modification techniques today to help Alice Kemp deal with her emotional eating and depression. She has agreed to increase Wellbutrin SR to 200 mg qd #30 with no refills and follow up as directed.  Obesity Alice Kemp is currently in the action stage of change. As such, her goal is to continue with weight loss efforts She has agreed to follow the Category 3 plan Alice Kemp has been instructed to work up to a goal of 150 minutes of combined cardio  and strengthening exercise per week for weight loss and overall health benefits. We discussed the following Behavioral Modification Strategies today: increasing lean protein intake and emotional eating strategies  Alice Kemp has agreed to follow up with our clinic in 2 weeks. She was informed of the importance of frequent follow up visits to maximize her success with intensive lifestyle modifications for her multiple health conditions.    OBESITY BEHAVIORAL INTERVENTION VISIT  Today's visit was # 9 out of 22.  Starting weight: 300 lbs Starting date: 07/29/17 Today's weight : 271 lbs  Today's date: 11/26/2017 Total lbs lost to date: 29 (Patients must lose 7 lbs in the first 6 months to continue with counseling)   ASK: We discussed the diagnosis of obesity with Alice Kemp today and Alice Kemp agreed to give [name] permission to discuss obesity behavioral modification therapy today.  ASSESS: Alice Kemp has the diagnosis of obesity and her BMI today is 48.02 Alice Kemp is in the action stage of change  ADVISE: Alice Kemp was educated on the multiple health risks of obesity as well as the benefit of weight loss to improve her health. She was advised of the need for long term treatment and the importance of lifestyle modifications.  AGREE: Multiple dietary modification options and treatment options were discussed and  Alice Kemp agreed to the above obesity treatment plan.   Corey Skains, am acting as transcriptionist for [employer], PA-C I, [facility], have reviewed this note and agree with its content.

## 2017-12-14 ENCOUNTER — Ambulatory Visit (INDEPENDENT_AMBULATORY_CARE_PROVIDER_SITE_OTHER): Payer: PPO | Admitting: Family Medicine

## 2017-12-14 ENCOUNTER — Encounter (INDEPENDENT_AMBULATORY_CARE_PROVIDER_SITE_OTHER): Payer: Self-pay

## 2017-12-17 ENCOUNTER — Ambulatory Visit (INDEPENDENT_AMBULATORY_CARE_PROVIDER_SITE_OTHER): Payer: PPO | Admitting: Family Medicine

## 2017-12-17 VITALS — BP 103/68 | HR 76 | Temp 97.6°F | Ht 63.0 in | Wt 268.0 lb

## 2017-12-17 DIAGNOSIS — F3289 Other specified depressive episodes: Secondary | ICD-10-CM | POA: Diagnosis not present

## 2017-12-17 DIAGNOSIS — Z6841 Body Mass Index (BMI) 40.0 and over, adult: Secondary | ICD-10-CM

## 2017-12-17 MED ORDER — BUPROPION HCL ER (XL) 300 MG PO TB24: 300.0000 mg | ORAL_TABLET | Freq: Every day | ORAL | 0 refills | Status: DC

## 2017-12-17 NOTE — Progress Notes (Signed)
Office: [phone]  /  Fax: [phone]   HPI:   Chief Complaint: OBESITY Alice Kemp is here to discuss her progress with her obesity treatment plan. She is on the Category 3 plan and is following her eating plan approximately 50 % of the time. She states she is exercising 0 minutes 0 times per week. Alice Kemp continues to do well with weight loss on the category 3 plan. She has had increased emotional eating recently, but she is still doing better than previously. Hunger is controlled. Her weight is 268 lb (121.6 kg) today and has had a weight loss of 3 pounds over a period of 3 weeks since her last visit. She has lost 32 lbs since starting treatment with [name].  Depression with emotional eating behaviors Alice Kemp is tearful in the office today, discussing the recent death of her dog. She was out of Wellbutrin /SR and took some ol Wellbutrin XL and feels she is doing well. Alice Kemp struggles with emotional eating and using food for comfort to the extent that it is negatively impacting her health. She often snacks when she is not hungry. Alice Kemp sometimes feels she is out of control and then feels guilty that she made poor food choices. She has been working on behavior modification techniques to help reduce her emotional eating and has been somewhat successful. She shows no sign of suicidal or homicidal ideations.  Depression screen Alice Kemp 2/9 07/29/2017 01/27/2017 01/08/2016  Decreased Interest 3 3 3   Down, Depressed, Hopeless 3 2 3   PHQ - 2 Score 6 5 6   Altered sleeping 1 3 0  Tired, decreased energy 3 3 3   Change in appetite 2 3 1   Feeling bad or failure about yourself  3 2 2   Trouble concentrating 1 3 0  Moving slowly or fidgety/restless 3 1 0  Suicidal thoughts 3 0 0  PHQ-9 Score 22 20 12   Difficult doing work/chores Extremely dIfficult Somewhat difficult Somewhat difficult     ALLERGIES: Allergies  Allergen Reactions  . Sulfa Antibiotics   . Penicillins Rash    MEDICATIONS: Current  Outpatient Medications on File Prior to Visit  Medication Sig Dispense Refill  . allopurinol (ZYLOPRIM) 100 MG tablet Take 1 tablet (100 mg total) by mouth daily. 90 tablet 3  . Alpha-Lipoic Acid 100 MG CAPS Take by mouth.    Marland Kitchen amitriptyline (ELAVIL) 10 MG tablet   5  . aspirin 81 MG tablet Take 81 mg by mouth daily. Reported on 12/24/2015    . celecoxib (CELEBREX) 100 MG capsule Take 1 capsule (100 mg total) by mouth daily as needed. Yearly physical w/labs due in August must see MD for future refills 90 capsule 0  . Coenzyme Q10 (EQL COQ10) 300 MG CAPS Take 1 capsule (300 mg total) daily at 12 noon by mouth. 30 capsule 0  . colchicine 0.6 MG tablet TAKE 2 TABLETS BY MOUTH AT ONSET OF FLARE THEN TAKE 1 TABLET 1 HOUR LATER 15 tablet 0  . DULoxetine (CYMBALTA) 30 MG capsule Take 1 capsule (30 mg total) by mouth 2 (two) times daily. 60 capsule 0  . fluticasone (CUTIVATE) 0.05 % cream APPLY 3 TIMES A DAY AS NEEDED FOR ITCHING 60 g 1  . furosemide (LASIX) 80 MG tablet Take 1 tablet (80 mg total) by mouth daily. 90 tablet 3  . gabapentin (NEURONTIN) 300 MG capsule     . levocetirizine (XYZAL) 5 MG tablet TAKE 1 TABLET BY MOUTH EVERY EVENING 90 tablet 3  . levothyroxine (  SYNTHROID, LEVOTHROID) 88 MCG tablet Take 88 mcg by mouth daily before breakfast.    . losartan (COZAAR) 25 MG tablet Take 1 tablet (25 mg total) by mouth daily. -- Office visit needed for further refills 90 tablet 0  . Magnesium 100 MG CAPS Take 2 capsules by mouth daily.    Marland Kitchen NOVOLOG MIX 70/30 (70-30) 100 UNIT/ML injection Inject into the skin.    Marland Kitchen NOVOLOG MIX 70/30 FLEXPEN (70-30) 100 UNIT/ML FlexPen Inject 85 units with breakfast, 60 units with lunch and 85 units with dinner. 225 mL 3  . ONETOUCH VERIO test strip   3  . rosuvastatin (CRESTOR) 5 MG tablet Take 1 tablet (5 mg total) by mouth daily. 90 tablet 3  . spironolactone (ALDACTONE) 25 MG tablet TAKE 1 TABLET (25 MG TOTAL) BY MOUTH DAILY. 90 tablet 3  . traMADol (ULTRAM) 50  MG tablet Take by mouth every 12 (twelve) hours as needed.    . vitamin B-12 (CYANOCOBALAMIN) 500 MCG tablet Take 500 mcg by mouth 2 (two) times daily.    . Vitamin D, Ergocalciferol, (DRISDOL) 50000 units CAPS capsule Take 1 capsule (50,000 Units total) by mouth every 3 (three) days. 10 capsule 0   No current facility-administered medications on file prior to visit.     PAST MEDICAL HISTORY: Past Medical History:  Diagnosis Date  . Anxiety   . Arthritis   . Back pain   . Benign neoplasm of colon 06/29/2012  . Breast abscess 07/2011   left; s/p I&D + abx by gen surg  . Cervical radiculopathy at C6   . CKD (chronic kidney disease) stage 3, GFR 30-59 ml/min (HCC)    follows with renal  . Constipation   . Depression   . Diabetes mellitus type II   . Diastolic heart failure ([place]) 09/2013 echo  . Gall bladder inflammation   . Glaucoma   . Gout   . Hyperlipidemia   . Hypothyroidism   . Joint pain   . Kidney disease   . Leg pain   . Morbid obesity ([place])   . OBSTRUCTIVE SLEEP APNEA 12/23/2010   npsg 2012:  AHI 25/hr, severe desat to 68%.   autoset 2013: optimal pressure 13cm   . PERICARDIAL EFFUSION 12/12/2010  . Pulmonary hypertension ([place])   . Shortness of breath   . Skin lesion of breast 07/09/2012  . Swelling of extremity   . Unspecified hypothyroidism   . Vitamin B12 deficiency   . Vitamin D deficiency     PAST SURGICAL HISTORY: Past Surgical History:  Procedure Laterality Date  . BREAST BIOPSY  1990   Left  . CATARACT EXTRACTION    . COLONOSCOPY  06/29/2012   Procedure: COLONOSCOPY;  Surgeon: Ladene Artist, MD,FACG;  Location: [facility];  Service: Endoscopy;  Laterality: N/A;  . SHOULDER SURGERY  2010   Left  . TONSILLECTOMY  1964    SOCIAL HISTORY: Social History   Tobacco Use  . Smoking status: Never Smoker  . Smokeless tobacco: Never Used  Substance Use Topics  . Alcohol use: No    Alcohol/week: 0.0 oz  . Drug use: No    FAMILY HISTORY: Family  History  Problem Relation Age of Onset  . Hypertension Mother   . Heart disease Mother   . Heart attack Mother   . Thyroid disease Mother   . Diabetes Father   . Heart disease Father   . Skin cancer Father   . Cancer Father  skin  . Heart attack Father   . Hyperlipidemia Father   . Colon polyps Father   . Depression Father   . Hypertension Brother   . Skin cancer Paternal Grandfather   . Colon cancer Paternal Grandfather 62  . Breast cancer Paternal Aunt   . Cancer Paternal Aunt        breast  . Diabetes Paternal Uncle   . Diabetes Maternal Grandfather   . Sudden death Neg Hx     ROS: Review of Systems  Constitutional: Positive for weight loss.  Psychiatric/Behavioral: Positive for depression. Negative for suicidal ideas.    PHYSICAL EXAM: Blood pressure 103/68, pulse 76, temperature 97.6 F (36.4 C), temperature source Oral, height 5\' 3"  (1.6 m), weight 268 lb (121.6 kg), SpO2 97 %. Body mass index is 47.47 kg/m. Physical Exam  Constitutional: She is oriented to person, place, and time. She appears well-developed and well-nourished.  Cardiovascular: Normal rate.  Pulmonary/Chest: Effort normal.  Musculoskeletal: Normal range of motion.  Neurological: She is oriented to person, place, and time.  Skin: Skin is warm and dry.  Vitals reviewed.   RECENT LABS AND TESTS: BMET    Component Value Date/Time   NA 138 07/30/2017 1020   K 4.8 07/30/2017 1020   CL 100 07/30/2017 1020   CO2 23 07/30/2017 1020   GLUCOSE 259 (H) 07/30/2017 1020   GLUCOSE 130 (H) 01/08/2016 1649   BUN 27 07/30/2017 1020   CREATININE 1.83 (H) 07/30/2017 1020   CALCIUM 9.3 07/30/2017 1020   GFRNONAA 28 (L) 07/30/2017 1020   GFRAA 32 (L) 07/30/2017 1020   Lab Results  Component Value Date   HGBA1C 7.9 (H) 07/30/2017   HGBA1C 8.1 (H) 04/10/2015   HGBA1C 10.0 (H) 04/11/2014   HGBA1C 7.6 (A) 09/02/2013   HGBA1C 8.9 (H) 08/01/2013   Lab Results  Component Value Date   INSULIN  37.2 (H) 07/30/2017   CBC    Component Value Date/Time   WBC 7.9 07/30/2017 1020   WBC 8.9 09/23/2013 1149   RBC 4.27 07/30/2017 1020   RBC 4.58 09/23/2013 1149   HGB 13.2 07/30/2017 1020   HCT 40.3 07/30/2017 1020   PLT 213 12/15/2016   MCV 94 07/30/2017 1020   MCH 30.9 07/30/2017 1020   MCH 28.1 01/29/2011 0500   MCHC 32.8 07/30/2017 1020   MCHC 33.0 09/23/2013 1149   RDW 13.5 07/30/2017 1020   LYMPHSABS 4.1 (H) 07/30/2017 1020   MONOABS 0.6 09/23/2013 1149   EOSABS 0.3 07/30/2017 1020   BASOSABS 0.0 07/30/2017 1020   Iron/TIBC/Ferritin/ %Sat No results found for: IRON, TIBC, FERRITIN, IRONPCTSAT Lipid Panel     Component Value Date/Time   CHOL 250 (H) 07/30/2017 1020   TRIG 379 (H) 07/30/2017 1020   HDL 31 (L) 07/30/2017 1020   CHOLHDL 4 04/10/2015 1212   VLDL 38.8 04/10/2015 1212   LDLCALC 143 (H) 07/30/2017 1020   LDLDIRECT 54.6 05/10/2012 1440   Hepatic Function Panel     Component Value Date/Time   PROT 7.1 07/30/2017 1020   ALBUMIN 3.9 07/30/2017 1020   AST 18 07/30/2017 1020   ALT 17 07/30/2017 1020   ALKPHOS 70 07/30/2017 1020   BILITOT 0.3 07/30/2017 1020   BILIDIR 0.1 05/10/2012 1440      Component Value Date/Time   TSH 2.610 07/30/2017 1020   TSH 1.62 04/10/2015 1212   TSH 1.65 09/23/2013 1149    ASSESSMENT AND PLAN: Other depression - with emotional eating - Plan:  CBC With Differential, Comprehensive metabolic panel, Hemoglobin A1c, Insulin, random, Lipid Panel With LDL/HDL Ratio, VITAMIN D 25 Hydroxy (Vit-D Deficiency, Fractures), buPROPion (WELLBUTRIN XL) 300 MG 24 hr tablet  Class 3 severe obesity with serious comorbidity and body mass index (BMI) of 45.0 to 49.9 in adult, unspecified obesity type (Alice Kemp)  PLAN:  Depression with Emotional Eating Behaviors We discussed behavior modification techniques today to help Alice Kemp deal with her emotional eating and depression. She has agreed to continue Wellbutrin XL 300 mg qd #30 with no refills and  discontinue Wellbutrin SR 200 mg and follow up as directed.  Obesity Alice Kemp is currently in the action stage of change. As such, her goal is to continue with weight loss efforts She has agreed to follow the Category 3 plan Alice Kemp has been instructed to work up to a goal of 150 minutes of combined cardio and strengthening exercise per week for weight loss and overall health benefits. We discussed the following Behavioral Modification Strategies today: increasing lean protein intake and emotional eating strategies  Alice Kemp has agreed to follow up with our clinic in 3 weeks. She was informed of the importance of frequent follow up visits to maximize her success with intensive lifestyle modifications for her multiple health conditions.   OBESITY BEHAVIORAL INTERVENTION VISIT  Today's visit was # 10 out of 22.  Starting weight: 300 lbs Starting date: 07/29/17 Today's weight : 268 lbs  Today's date: 12/17/2017 Total lbs lost to date: 67 (Patients must lose 7 lbs in the first 6 months to continue with counseling)   ASK: We discussed the diagnosis of obesity with Alice Kemp today and Alice Kemp agreed to give [name] permission to discuss obesity behavioral modification therapy today.  ASSESS: Alice Kemp has the diagnosis of obesity and her BMI today is 47.49 Alice Kemp is in the action stage of change   ADVISE: Alice Kemp was educated on the multiple health risks of obesity as well as the benefit of weight loss to improve her health. She was advised of the need for long term treatment and the importance of lifestyle modifications.  AGREE: Multiple dietary modification options and treatment options were discussed and  Hayslee agreed to the above obesity treatment plan.  I, Alice Kemp, am acting as transcriptionist for Alice Nip, MD  I have reviewed the above documentation for accuracy and completeness, and I agree with the above. -Alice Nip, MD

## 2017-12-18 LAB — COMPREHENSIVE METABOLIC PANEL
ALBUMIN: 3.7 g/dL (ref 3.6–4.8)
ALK PHOS: 90 IU/L (ref 39–117)
ALT: 13 IU/L (ref 0–32)
AST: 15 IU/L (ref 0–40)
Albumin/Globulin Ratio: 1 — ABNORMAL LOW (ref 1.2–2.2)
BUN/Creatinine Ratio: 16 (ref 12–28)
BUN: 38 mg/dL — AB (ref 8–27)
Bilirubin Total: 0.3 mg/dL (ref 0.0–1.2)
CALCIUM: 9.3 mg/dL (ref 8.7–10.3)
CO2: 22 mmol/L (ref 20–29)
CREATININE: 2.37 mg/dL — AB (ref 0.57–1.00)
Chloride: 101 mmol/L (ref 96–106)
GFR calc Af Amer: 24 mL/min/{1.73_m2} — ABNORMAL LOW (ref >59)
GFR calc non Af Amer: 21 mL/min/{1.73_m2} — ABNORMAL LOW (ref >59)
GLUCOSE: 184 mg/dL — AB (ref 65–99)
Globulin, Total: 3.7 g/dL (ref 1.5–4.5)
Potassium: 4.5 mmol/L (ref 3.5–5.2)
Sodium: 141 mmol/L (ref 134–144)
Total Protein: 7.4 g/dL (ref 6.0–8.5)

## 2017-12-18 LAB — CBC WITH DIFFERENTIAL
BASOS ABS: 0 10*3/uL (ref 0.0–0.2)
Basos: 0 %
EOS (ABSOLUTE): 0.3 10*3/uL (ref 0.0–0.4)
Eos: 2 %
HEMOGLOBIN: 13.5 g/dL (ref 11.1–15.9)
Hematocrit: 40 % (ref 34.0–46.6)
IMMATURE GRANULOCYTES: 0 %
Immature Grans (Abs): 0 10*3/uL (ref 0.0–0.1)
LYMPHS ABS: 4.6 10*3/uL — AB (ref 0.7–3.1)
Lymphs: 38 %
MCH: 31.5 pg (ref 26.6–33.0)
MCHC: 33.8 g/dL (ref 31.5–35.7)
MCV: 94 fL (ref 79–97)
MONOCYTES: 6 %
Monocytes Absolute: 0.8 10*3/uL (ref 0.1–0.9)
Neutrophils Absolute: 6.6 10*3/uL (ref 1.4–7.0)
Neutrophils: 54 %
RBC: 4.28 x10E6/uL (ref 3.77–5.28)
RDW: 14.3 % (ref 12.3–15.4)
WBC: 12.3 10*3/uL — AB (ref 3.4–10.8)

## 2017-12-18 LAB — LIPID PANEL WITH LDL/HDL RATIO
Cholesterol, Total: 211 mg/dL — ABNORMAL HIGH (ref 100–199)
HDL: 33 mg/dL — AB (ref >39)
LDL Calculated: 130 mg/dL — ABNORMAL HIGH (ref 0–99)
LDL/HDL RATIO: 3.9 ratio — AB (ref 0.0–3.2)
Triglycerides: 241 mg/dL — ABNORMAL HIGH (ref 0–149)
VLDL Cholesterol Cal: 48 mg/dL — ABNORMAL HIGH (ref 5–40)

## 2017-12-18 LAB — INSULIN, RANDOM: INSULIN: 31.9 u[IU]/mL — ABNORMAL HIGH (ref 2.6–24.9)

## 2017-12-18 LAB — HEMOGLOBIN A1C
Est. average glucose Bld gHb Est-mCnc: 206 mg/dL
Hgb A1c MFr Bld: 8.8 % — ABNORMAL HIGH (ref 4.8–5.6)

## 2017-12-18 LAB — VITAMIN D 25 HYDROXY (VIT D DEFICIENCY, FRACTURES): VIT D 25 HYDROXY: 40.4 ng/mL (ref 30.0–100.0)

## 2018-01-05 ENCOUNTER — Ambulatory Visit (INDEPENDENT_AMBULATORY_CARE_PROVIDER_SITE_OTHER): Payer: PPO | Admitting: Family Medicine

## 2018-01-07 ENCOUNTER — Ambulatory Visit (INDEPENDENT_AMBULATORY_CARE_PROVIDER_SITE_OTHER): Payer: PPO | Admitting: Family Medicine

## 2018-01-18 ENCOUNTER — Other Ambulatory Visit (INDEPENDENT_AMBULATORY_CARE_PROVIDER_SITE_OTHER): Payer: Self-pay | Admitting: Family Medicine

## 2018-01-18 ENCOUNTER — Ambulatory Visit (INDEPENDENT_AMBULATORY_CARE_PROVIDER_SITE_OTHER): Payer: PPO | Admitting: Family Medicine

## 2018-01-18 VITALS — BP 107/71 | HR 91 | Temp 98.1°F | Ht 63.0 in | Wt 273.0 lb

## 2018-01-18 DIAGNOSIS — E559 Vitamin D deficiency, unspecified: Secondary | ICD-10-CM

## 2018-01-18 DIAGNOSIS — Z6841 Body Mass Index (BMI) 40.0 and over, adult: Secondary | ICD-10-CM | POA: Diagnosis not present

## 2018-01-18 MED ORDER — VITAMIN D (ERGOCALCIFEROL) 1.25 MG (50000 UNIT) PO CAPS: 50000.0000 [IU] | ORAL_CAPSULE | ORAL | 0 refills | Status: DC

## 2018-01-18 MED FILL — ALLOPURINOL 100 MG TABLET: 100 | 90 days supply | Qty: 90 | Fill #1

## 2018-01-18 NOTE — Progress Notes (Signed)
Office: [phone]  /  Fax: [phone]   HPI:   Chief Complaint: OBESITY Alice Kemp is here to discuss her progress with her obesity treatment plan. Alice Kemp is on the Category 3 plan and is following her eating plan approximately 40 % of the time. Alice Kemp states Alice Kemp is exercising 0 minutes 0 times per week. Alice Kemp has gotten off track with her diet prescription, while busy caring for her elderly parents. Alice Kemp has gotten bored with her category 3 plan. Her weight is 273 lb (123.8 kg) today and has had a weight gain of 5 pounds over a period of 3 to 4 weeks since her last visit. Alice Kemp has lost 27 lbs since starting treatment with [name].  Vitamin D deficiency Alice Kemp has a diagnosis of vitamin D deficiency. Alice Kemp is stable on prescription vit D. Her level is slowly improving but is not yet at goal. Alice Kemp denies nausea, vomiting or muscle weakness.  ALLERGIES: Allergies  Allergen Reactions  . Sulfa Antibiotics   . Penicillins Rash    MEDICATIONS: Current Outpatient Medications on File Prior to Visit  Medication Sig Dispense Refill  . allopurinol (ZYLOPRIM) 100 MG tablet Take 1 tablet (100 mg total) by mouth daily. 90 tablet 3  . Alpha-Lipoic Acid 100 MG CAPS Take by mouth.    Marland Kitchen amitriptyline (ELAVIL) 10 MG tablet   5  . aspirin 81 MG tablet Take 81 mg by mouth daily. Reported on 12/24/2015    . buPROPion (WELLBUTRIN XL) 300 MG 24 hr tablet Take 1 tablet (300 mg total) by mouth daily. 30 tablet 0  . celecoxib (CELEBREX) 100 MG capsule Take 1 capsule (100 mg total) by mouth daily as needed. Yearly physical w/labs due in August must see MD for future refills 90 capsule 0  . Coenzyme Q10 (EQL COQ10) 300 MG CAPS Take 1 capsule (300 mg total) daily at 12 noon by mouth. 30 capsule 0  . colchicine 0.6 MG tablet TAKE 2 TABLETS BY MOUTH AT ONSET OF FLARE THEN TAKE 1 TABLET 1 HOUR LATER 15 tablet 0  . DULoxetine (CYMBALTA) 30 MG capsule Take 1 capsule (30 mg total) by mouth 2 (two) times daily. 60 capsule 0  .  fluticasone (CUTIVATE) 0.05 % cream APPLY 3 TIMES A DAY AS NEEDED FOR ITCHING 60 g 1  . furosemide (LASIX) 80 MG tablet Take 1 tablet (80 mg total) by mouth daily. 90 tablet 3  . gabapentin (NEURONTIN) 300 MG capsule     . levocetirizine (XYZAL) 5 MG tablet TAKE 1 TABLET BY MOUTH EVERY EVENING 90 tablet 3  . levothyroxine (SYNTHROID, LEVOTHROID) 88 MCG tablet Take 88 mcg by mouth daily before breakfast.    . losartan (COZAAR) 25 MG tablet Take 1 tablet (25 mg total) by mouth daily. -- Office visit needed for further refills 90 tablet 0  . Magnesium 100 MG CAPS Take 2 capsules by mouth daily.    Marland Kitchen NOVOLOG MIX 70/30 (70-30) 100 UNIT/ML injection Inject into the skin.    Marland Kitchen NOVOLOG MIX 70/30 FLEXPEN (70-30) 100 UNIT/ML FlexPen Inject 85 units with breakfast, 60 units with lunch and 85 units with dinner. 225 mL 3  . ONETOUCH VERIO test strip   3  . rosuvastatin (CRESTOR) 5 MG tablet Take 1 tablet (5 mg total) by mouth daily. 90 tablet 3  . spironolactone (ALDACTONE) 25 MG tablet TAKE 1 TABLET (25 MG TOTAL) BY MOUTH DAILY. 90 tablet 3  . traMADol (ULTRAM) 50 MG tablet Take by mouth every 12 (  twelve) hours as needed.    . vitamin B-12 (CYANOCOBALAMIN) 500 MCG tablet Take 500 mcg by mouth 2 (two) times daily.     No current facility-administered medications on file prior to visit.     PAST MEDICAL HISTORY: Past Medical History:  Diagnosis Date  . Anxiety   . Arthritis   . Back pain   . Benign neoplasm of colon 06/29/2012  . Breast abscess 07/2011   left; s/p I&D + abx by gen surg  . Cervical radiculopathy at C6   . CKD (chronic kidney disease) stage 3, GFR 30-59 ml/min (HCC)    follows with renal  . Constipation   . Depression   . Diabetes mellitus type II   . Diastolic heart failure ([place]) 09/2013 echo  . Gall bladder inflammation   . Glaucoma   . Gout   . Hyperlipidemia   . Hypothyroidism   . Joint pain   . Kidney disease   . Leg pain   . Morbid obesity ([place])   . OBSTRUCTIVE SLEEP  APNEA 12/23/2010   npsg 2012:  AHI 25/hr, severe desat to 68%.   autoset 2013: optimal pressure 13cm   . PERICARDIAL EFFUSION 12/12/2010  . Pulmonary hypertension ([place])   . Shortness of breath   . Skin lesion of breast 07/09/2012  . Swelling of extremity   . Unspecified hypothyroidism   . Vitamin B12 deficiency   . Vitamin D deficiency     PAST SURGICAL HISTORY: Past Surgical History:  Procedure Laterality Date  . BREAST BIOPSY  1990   Left  . CATARACT EXTRACTION    . COLONOSCOPY  06/29/2012   Procedure: COLONOSCOPY;  Surgeon: Ladene Artist, MD,FACG;  Location: [facility];  Service: Endoscopy;  Laterality: N/A;  . SHOULDER SURGERY  2010   Left  . TONSILLECTOMY  1964    SOCIAL HISTORY: Social History   Tobacco Use  . Smoking status: Never Smoker  . Smokeless tobacco: Never Used  Substance Use Topics  . Alcohol use: No    Alcohol/week: 0.0 oz  . Drug use: No    FAMILY HISTORY: Family History  Problem Relation Age of Onset  . Hypertension Mother   . Heart disease Mother   . Heart attack Mother   . Thyroid disease Mother   . Diabetes Father   . Heart disease Father   . Skin cancer Father   . Cancer Father        skin  . Heart attack Father   . Hyperlipidemia Father   . Colon polyps Father   . Depression Father   . Hypertension Brother   . Skin cancer Paternal Grandfather   . Colon cancer Paternal Grandfather 5  . Breast cancer Paternal Aunt   . Cancer Paternal Aunt        breast  . Diabetes Paternal Uncle   . Diabetes Maternal Grandfather   . Sudden death Neg Hx     ROS: Review of Systems  Constitutional: Negative for weight loss.  Gastrointestinal: Negative for nausea and vomiting.  Musculoskeletal:       Negative for muscle weakness    PHYSICAL EXAM: Blood pressure 107/71, pulse 91, temperature 98.1 F (36.7 C), temperature source Oral, height 5\' 3"  (1.6 m), weight 273 lb (123.8 kg), SpO2 96 %. Body mass index is 48.36 kg/m. Physical Exam    Constitutional: Alice Kemp is oriented to person, place, and time. Alice Kemp appears well-developed and well-nourished.  Cardiovascular: Normal rate.  Pulmonary/Chest: Effort normal.  Musculoskeletal: Normal range of motion.  Neurological: Alice Kemp is oriented to person, place, and time.  Skin: Skin is warm and dry.  Psychiatric: Alice Kemp has a normal mood and affect. Her behavior is normal.  Vitals reviewed.   RECENT LABS AND TESTS: BMET    Component Value Date/Time   NA 141 12/17/2017 1123   K 4.5 12/17/2017 1123   CL 101 12/17/2017 1123   CO2 22 12/17/2017 1123   GLUCOSE 184 (H) 12/17/2017 1123   GLUCOSE 130 (H) 01/08/2016 1649   BUN 38 (H) 12/17/2017 1123   CREATININE 2.37 (H) 12/17/2017 1123   CALCIUM 9.3 12/17/2017 1123   GFRNONAA 21 (L) 12/17/2017 1123   GFRAA 24 (L) 12/17/2017 1123   Lab Results  Component Value Date   HGBA1C 8.8 (H) 12/17/2017   HGBA1C 7.9 (H) 07/30/2017   HGBA1C 8.1 (H) 04/10/2015   HGBA1C 10.0 (H) 04/11/2014   HGBA1C 7.6 (A) 09/02/2013   Lab Results  Component Value Date   INSULIN 31.9 (H) 12/17/2017   INSULIN 37.2 (H) 07/30/2017   CBC    Component Value Date/Time   WBC 12.3 (H) 12/17/2017 1123   WBC 8.9 09/23/2013 1149   RBC 4.28 12/17/2017 1123   RBC 4.58 09/23/2013 1149   HGB 13.5 12/17/2017 1123   HCT 40.0 12/17/2017 1123   PLT 213 12/15/2016   MCV 94 12/17/2017 1123   MCH 31.5 12/17/2017 1123   MCH 28.1 01/29/2011 0500   MCHC 33.8 12/17/2017 1123   MCHC 33.0 09/23/2013 1149   RDW 14.3 12/17/2017 1123   LYMPHSABS 4.6 (H) 12/17/2017 1123   MONOABS 0.6 09/23/2013 1149   EOSABS 0.3 12/17/2017 1123   BASOSABS 0.0 12/17/2017 1123   Iron/TIBC/Ferritin/ %Sat No results found for: IRON, TIBC, FERRITIN, IRONPCTSAT Lipid Panel     Component Value Date/Time   CHOL 211 (H) 12/17/2017 1123   TRIG 241 (H) 12/17/2017 1123   HDL 33 (L) 12/17/2017 1123   CHOLHDL 4 04/10/2015 1212   VLDL 38.8 04/10/2015 1212   LDLCALC 130 (H) 12/17/2017 1123   LDLDIRECT  54.6 05/10/2012 1440   Hepatic Function Panel     Component Value Date/Time   PROT 7.4 12/17/2017 1123   ALBUMIN 3.7 12/17/2017 1123   AST 15 12/17/2017 1123   ALT 13 12/17/2017 1123   ALKPHOS 90 12/17/2017 1123   BILITOT 0.3 12/17/2017 1123   BILIDIR 0.1 05/10/2012 1440      Component Value Date/Time   TSH 2.610 07/30/2017 1020   TSH 1.62 04/10/2015 1212   TSH 1.65 09/23/2013 1149   Results for [place], LYZETTE REINHARDT (MRN [id number]) as of 01/18/2018 17:27  Ref. Range 12/17/2017 11:23  Vitamin D, 25-Hydroxy Latest Ref Range: 30.0 - 100.0 ng/mL 40.4   ASSESSMENT AND PLAN: Vitamin D deficiency - Plan: Vitamin D, Ergocalciferol, (DRISDOL) 50000 units CAPS capsule  Class 3 severe obesity with serious comorbidity and body mass index (BMI) of 45.0 to 49.9 in adult, unspecified obesity type ([place])  PLAN:  Vitamin D Deficiency Alice Kemp was informed that low vitamin D levels contributes to fatigue and are associated with obesity, breast, and colon cancer. Alice Kemp agrees to continue to take prescription Vit D @50 ,000 IU every 3 days #10 with no refills and will follow up for routine testing of vitamin D, at least 2-3 times per year. Alice Kemp was informed of the risk of over-replacement of vitamin D and agrees to not increase her dose unless Alice Kemp discusses this with [name] first. Alice Kemp agrees to  follow up with our clinic in 2 to 3 weeks.  Obesity Alice Kemp is currently in the action stage of change. As such, her goal is to continue with weight loss efforts Alice Kemp has agreed to follow the [place] eating plan Alice Kemp has been instructed to work up to a goal of 150 minutes of combined cardio and strengthening exercise per week for weight loss and overall health benefits. We discussed the following Behavioral Modification Strategies today: no skipping meals, increasing lean protein intake and decreasing simple carbohydrates   Alice Kemp has agreed to follow up with our clinic in 2 to 3 weeks. Alice Kemp was informed of the  importance of frequent follow up visits to maximize her success with intensive lifestyle modifications for her multiple health conditions.   OBESITY BEHAVIORAL INTERVENTION VISIT  Today's visit was # 11 out of 22.  Starting weight: 300 lbs Starting date: 07/29/17 Today's weight : 273 lbs Today's date: 01/18/2018 Total lbs lost to date: 12 (Patients must lose 7 lbs in the first 6 months to continue with counseling)   ASK: We discussed the diagnosis of obesity with Alice Kemp today and Alice Kemp agreed to give [name] permission to discuss obesity behavioral modification therapy today.  ASSESS: Alice Kemp has the diagnosis of obesity and her BMI today is 48.37 Alice Kemp is in the action stage of change   ADVISE: Alice Kemp was educated on the multiple health risks of obesity as well as the benefit of weight loss to improve her health. Alice Kemp was advised of the need for long term treatment and the importance of lifestyle modifications.  AGREE: Multiple dietary modification options and treatment options were discussed and  Alice Kemp agreed to the above obesity treatment plan.  I, Doreene Nest, am acting as transcriptionist for Dennard Nip, MD  I have reviewed the above documentation for accuracy and completeness, and I agree with the above. -Dennard Nip, MD

## 2018-01-25 DIAGNOSIS — N2581 Secondary hyperparathyroidism of renal origin: Secondary | ICD-10-CM | POA: Diagnosis not present

## 2018-01-25 DIAGNOSIS — N189 Chronic kidney disease, unspecified: Secondary | ICD-10-CM | POA: Diagnosis not present

## 2018-01-25 DIAGNOSIS — N184 Chronic kidney disease, stage 4 (severe): Secondary | ICD-10-CM | POA: Diagnosis not present

## 2018-01-28 DIAGNOSIS — N2581 Secondary hyperparathyroidism of renal origin: Secondary | ICD-10-CM | POA: Diagnosis not present

## 2018-01-28 DIAGNOSIS — I129 Hypertensive chronic kidney disease with stage 1 through stage 4 chronic kidney disease, or unspecified chronic kidney disease: Secondary | ICD-10-CM | POA: Diagnosis not present

## 2018-01-28 DIAGNOSIS — D631 Anemia in chronic kidney disease: Secondary | ICD-10-CM | POA: Diagnosis not present

## 2018-01-28 DIAGNOSIS — N184 Chronic kidney disease, stage 4 (severe): Secondary | ICD-10-CM | POA: Diagnosis not present

## 2018-02-01 ENCOUNTER — Other Ambulatory Visit: Payer: Self-pay

## 2018-02-03 ENCOUNTER — Encounter (INDEPENDENT_AMBULATORY_CARE_PROVIDER_SITE_OTHER): Payer: Self-pay

## 2018-02-03 ENCOUNTER — Ambulatory Visit (INDEPENDENT_AMBULATORY_CARE_PROVIDER_SITE_OTHER): Payer: PPO | Admitting: Family Medicine

## 2018-02-03 ENCOUNTER — Other Ambulatory Visit: Payer: Self-pay

## 2018-02-03 DIAGNOSIS — E1122 Type 2 diabetes mellitus with diabetic chronic kidney disease: Secondary | ICD-10-CM

## 2018-02-03 DIAGNOSIS — IMO0002 Reserved for concepts with insufficient information to code with codable children: Secondary | ICD-10-CM

## 2018-02-03 DIAGNOSIS — N183 Chronic kidney disease, stage 3 (moderate): Principal | ICD-10-CM

## 2018-02-03 DIAGNOSIS — E1165 Type 2 diabetes mellitus with hyperglycemia: Principal | ICD-10-CM
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
## 2018-02-05 ENCOUNTER — Encounter: Payer: Self-pay | Admitting: *Deleted

## 2018-02-05 ENCOUNTER — Other Ambulatory Visit: Payer: Self-pay | Admitting: Pharmacist

## 2018-02-05 ENCOUNTER — Other Ambulatory Visit: Payer: Self-pay | Admitting: *Deleted

## 2018-02-05 MED FILL — VIT D2 1.25 MG (50,000 UNIT: 1.25 MG | 30 days supply | Qty: 10 | Fill #0

## 2018-02-05 MED FILL — BuPROPion HCL ER (XL) 300 M: 300 | 30 days supply | Qty: 30 | Fill #0

## 2018-02-05 MED FILL — ONETOUCH VERIO TEST STRIP: 30 days supply | Qty: 200 | Fill #2

## 2018-02-05 MED FILL — NOVOLOG MIX 70-30 FLEXPEN S: (70-30) 100 | 30 days supply | Qty: 66 | Fill #1

## 2018-02-05 MED FILL — LEVOTHYROXINE 88 MCG TABLET: 88 | 90 days supply | Qty: 90 | Fill #1

## 2018-02-05 NOTE — Patient Outreach (Signed)
[place] [facility]) Care Management  02/05/2018  LELAH RENNAKER 1950-12-19 [id number]  67 y.o. year old female referred to [place] for Medication Assistance (Initial pharmacy outreach- insulin ) Referred by Peter Garter, [facility] RN for medication assistance with insulin.  Was able to reach patient via telephone today, unfortunately patient was busy and asked that I call back at another time. (unsuccessful outreach #1).  Plan: Will follow-up within 2-4  business days via telephone.   Charlett Lango, PharmD Clinical Pharmacist, [facility] [phone]

## 2018-02-05 NOTE — Patient Outreach (Signed)
[place] [facility]) Care Management  02/05/2018  [name] 11-15-1950 [id number]   CSW was able to make initial contact with patient today to introduce self and explain role before patient reported that she needed to call CSW back at a later time.  CSW was able to obtain two HIPAA compliant identifiers from patient, as well as explain the reason for the call.  Patient went on to say that she was visiting with her brother in the hospital, and that it would probably be Monday before she would be able to return CSW's call.  CSW voiced understanding, ensuring that patient has the correct contact information for CSW.  CSW will report findings of phone conversation with patient to [name], [place] colleague for whom CSW is currently covering.  A second outreach attempt will be made within the next 3-4 business days, if a return call is not received from patient in the meantime. Nat Christen, BSW, MSW, LCSW  Licensed Education officer, [facility]  Mailing [place] [address] [place], [place] [address] Physical Address-[address], [place], [place] [address] Toll Free Main # [phone] Fax # [phone] Cell # [phone]  Office # [phone] [name]

## 2018-02-08 ENCOUNTER — Ambulatory Visit: Payer: Self-pay | Admitting: Pharmacist

## 2018-02-09 ENCOUNTER — Ambulatory Visit: Payer: Self-pay | Admitting: Pharmacist

## 2018-02-09 ENCOUNTER — Other Ambulatory Visit: Payer: Self-pay | Admitting: Pharmacist

## 2018-02-09 NOTE — Patient Outreach (Signed)
[place] [facility]) Care Management  02/09/2018  Alice Kemp [birth year] [id number]   67 y.o. year old female referred to [facility] for Medication Assistance (Initial pharmacy outreach- insulin) Referred by Peter Garter, [facility] RN for medication assistance with insulin.  Was able to reach patient via telephone today, left HIPAA complaint voicemail asking her to return my call. (unsuccessful outreach #2).  Plan: Will make final outreach attempt via telephone.   Charlett Lango, PharmD Clinical Pharmacist, [facility] [phone]

## 2018-02-15 DIAGNOSIS — E039 Hypothyroidism, unspecified: Secondary | ICD-10-CM | POA: Diagnosis not present

## 2018-02-15 DIAGNOSIS — E7800 Pure hypercholesterolemia, unspecified: Secondary | ICD-10-CM | POA: Diagnosis not present

## 2018-02-15 DIAGNOSIS — E1165 Type 2 diabetes mellitus with hyperglycemia: Secondary | ICD-10-CM | POA: Diagnosis not present

## 2018-02-15 DIAGNOSIS — I10 Essential (primary) hypertension: Secondary | ICD-10-CM | POA: Diagnosis not present

## 2018-02-15 DIAGNOSIS — R809 Proteinuria, unspecified: Secondary | ICD-10-CM | POA: Diagnosis not present

## 2018-02-17 ENCOUNTER — Other Ambulatory Visit: Payer: Self-pay

## 2018-02-17 NOTE — Patient Outreach (Signed)
[place] [facility]) Care Management  02/17/2018  SCHAE CANDO 1951/09/16 [id number]   Telephone call for scheduled telephone visit today Member states that she is unable to complete visit today.  States her father is now in rehab and her cousin just died so she does not have time to talk today.  States she did receive the packet that [facility] sent her. Member agreeable to call in 2 weeks as she thinks she will be able to talk then.  She also states that the Education officer, museum and pharmacist have tried to get her but she has not been able to talk.  States she would like to talk to them when she is able. Plan to communicate to social worker and pharmacist to delay contacting her for a week or so  Plan to call in 2 weeks for telephone visit.  Peter Garter RN, [facility] Care Management Coordinator [facility] Care Management [phone]

## 2018-02-19 ENCOUNTER — Ambulatory Visit: Payer: Self-pay | Admitting: Pharmacist

## 2018-02-23 ENCOUNTER — Other Ambulatory Visit: Payer: Self-pay | Admitting: *Deleted

## 2018-02-23 NOTE — Patient Outreach (Signed)
[facility] [facility]) Care Management  02/23/2018  Alice Kemp 12/11/1950 [id number]   CSW made aware by [facility] staff to hold on outreach due to patient request. CSW will plan an initial phone outreach call next week.  [name], MSW, [place] Worker  [place] [phone]

## 2018-03-02 ENCOUNTER — Other Ambulatory Visit: Payer: Self-pay

## 2018-03-02 DIAGNOSIS — E1122 Type 2 diabetes mellitus with diabetic chronic kidney disease: Secondary | ICD-10-CM

## 2018-03-02 DIAGNOSIS — IMO0002 Reserved for concepts with insufficient information to code with codable children: Secondary | ICD-10-CM

## 2018-03-02 DIAGNOSIS — E1165 Type 2 diabetes mellitus with hyperglycemia: Principal | ICD-10-CM

## 2018-03-02 DIAGNOSIS — N183 Chronic kidney disease, stage 3 (moderate): Principal | ICD-10-CM

## 2018-03-02 NOTE — Patient Outreach (Signed)
[place] [facility]) Care Management  03/02/2018   DEZIRE TURK [birth year] [id number]   Telephone call for follow up visit for diabetes chronic care improvement program  Subjective: Member states that her Father passed away on 03/09/2023 08-Jan-2023 and they just had his funeral this weekend.  States she has not been focusing on taking care of herself during this time.  States she has not been checking her blood sugars for the last few weeks.  States she has not been watching her diet and she is back to drinking sweet tea when she is at her Mothers house.  States she has been fast food eating frequently but states she usually just gets a burger or sandwich with no fries.  States she saw Dr.Balan on 02/15/18 and her hemoglobin A1C has dropped to close to 8%.  States she did not change any of her medications and she does not need to see her until November.  States she did get her Silver Sneakers card and she knows she can go to [employer] in [place] but she has not been able to go.  States she does not want to make a goal to go exercise yet.  States she has not made a follow up appt with Dr.Beasely as she has gained weight and she does not want to disappoint her.  Objective: Member has hx of  Type 2 DM, Stage 3 chronic kidney disease, HTN, hyperlipidemia, gout, depression and chronic back pain per review of medical record. Hemoglobin A1C 8.1% 02/15/18 per  [employer]([facility])   Current Medications:  Current Outpatient Medications  Medication Sig Dispense Refill  . allopurinol (ZYLOPRIM) 100 MG tablet Take 1 tablet (100 mg total) by mouth daily. 90 tablet 3  . Alpha-Lipoic Acid 100 MG CAPS Take by mouth.    Marland Kitchen amitriptyline (ELAVIL) 10 MG tablet at bedtime as needed.   5  . aspirin 81 MG tablet Take 81 mg by mouth daily. Reported on 12/24/2015    . buPROPion (WELLBUTRIN XL) 300 MG 24 hr tablet Take 1 tablet (300 mg total) by mouth daily. 30 tablet 0  . celecoxib (CELEBREX) 100 MG capsule Take  1 capsule (100 mg total) by mouth daily as needed. Yearly physical w/labs due in August must see MD for future refills 90 capsule 0  . Coenzyme Q10 (EQL COQ10) 300 MG CAPS Take 1 capsule (300 mg total) daily at 12 noon by mouth. 30 capsule 0  . colchicine 0.6 MG tablet TAKE 2 TABLETS BY MOUTH AT ONSET OF FLARE THEN TAKE 1 TABLET 1 HOUR LATER 15 tablet 0  . DULoxetine (CYMBALTA) 30 MG capsule Take 1 capsule (30 mg total) by mouth 2 (two) times daily. 60 capsule 0  . fluticasone (CUTIVATE) 0.05 % cream APPLY 3 TIMES A DAY AS NEEDED FOR ITCHING 60 g 1  . furosemide (LASIX) 80 MG tablet Take 1 tablet (80 mg total) by mouth daily. 90 tablet 3  . gabapentin (NEURONTIN) 300 MG capsule 300 mg 3 (three) times daily.     Marland Kitchen levocetirizine (XYZAL) 5 MG tablet TAKE 1 TABLET BY MOUTH EVERY EVENING 90 tablet 3  . levothyroxine (SYNTHROID, LEVOTHROID) 88 MCG tablet Take 88 mcg by mouth daily before breakfast.    . losartan (COZAAR) 25 MG tablet Take 1 tablet (25 mg total) by mouth daily. -- Office visit needed for further refills 90 tablet 0  . Magnesium 100 MG CAPS Take 2 capsules by mouth daily.    Marland Kitchen NOVOLOG  MIX 70/30 (70-30) 100 UNIT/ML injection Inject into the skin. 85 units breakfast , 55 units lunch and dinner    . rosuvastatin (CRESTOR) 5 MG tablet Take 1 tablet (5 mg total) by mouth daily. 90 tablet 3  . spironolactone (ALDACTONE) 25 MG tablet TAKE 1 TABLET (25 MG TOTAL) BY MOUTH DAILY. 90 tablet 3  . traMADol (ULTRAM) 50 MG tablet Take by mouth every 12 (twelve) hours as needed.    . vitamin B-12 (CYANOCOBALAMIN) 500 MCG tablet Take 500 mcg by mouth 2 (two) times daily.    . Vitamin D, Ergocalciferol, (DRISDOL) 50000 units CAPS capsule Take 1 capsule (50,000 Units total) by mouth every 3 (three) days. 10 capsule 0  . NOVOLOG MIX 70/30 FLEXPEN (70-30) 100 UNIT/ML FlexPen Inject 85 units with breakfast, 60 units with lunch and 85 units with dinner. (Patient not taking: Reported on 02/03/2018) 225 mL 3  .  ONETOUCH VERIO test strip   3   No current facility-administered medications for this visit.     Functional Status:  In your present state of health, do you have any difficulty performing the following activities: 02/03/2018  Hearing? N  Vision? Y  Difficulty concentrating or making decisions? N  Walking or climbing stairs? Y  Dressing or bathing? N  Doing errands, shopping? N  Preparing Food and eating ? N  Using the Toilet? N  In the past six months, have you accidently leaked urine? Y  Do you have problems with loss of bowel control? N  Managing your Medications? N  Managing your Finances? N  Housekeeping or managing your Housekeeping? Y  Some recent data might be hidden    Fall/Depression Screening: Fall Risk  02/03/2018 01/08/2016  Falls in the past year? Yes Yes  Number falls in past yr: 2 or more 2 or more  Injury with Fall? Yes Yes  Comment fell off porch scraped knees -  Risk Factor Category  High Fall Risk -  Risk for fall due to : History of fall(s);Impaired balance/gait -  Follow up Education provided -   [facility] 2/9 Scores 02/03/2018 07/29/2017 01/27/2017 01/08/2016  PHQ - 2 Score '4 6 5 6  '$ PHQ- 9 Score '15 22 20 12    '$ Assessment: Follow up telephone for disease management of Type 2 DM. Member continues to  not meeting diabetes self management goal of hemoglobin A1C less than 7% with last reading 8.1%. Hemoglobin A1C decreased from 8.8%.   Member has been non-adherent with diet and CBG checks since her Father expired May 18/19.  Member has received Silver Sneakers card but has not used it yet and declines to make an exercise goal at this time.  Member is awaiting contact from Education officer, museum and pharmacy as member had requested they be delayed while caring for her Father. Member up to date with annual dilated eye exam and foot exam.  Member had flu vaccination and pneumonia vaccine in September 2018.  Plan:  [facility] CM Care Plan Problem One     Most Recent Value  Care Plan Problem  One  Knowledge deficit related to diabetes self mangement as evidenced by elevated hemoglobin A1C of 8.8%  Role Documenting the Problem One  Care Management [place] for Problem One  Active  THN Long Term Goal   Member will decrease hemoglobinA1C by 1% in the next 90 days   THN Long Term Goal Start Date  02/03/18  Interventions for Problem One Long Term Goal  Praised for  lowering her hemoglobin A1C to 8.1%, Instructed on the importance of weight loss and how it helps with her diabetes control, Encouraged to go back to see Dr. Leafy Ro, Instructed on making wise choices when eating fast food, Reviewed the rule of 15 and how to treat hypoglycemia, Instructed on how diabetes can cause kidney failur and the importance of taking her Losartan and keeping her B/P in range  THN CM Short Term Goal #1   Member will check CBG twice a day for 2 weeks and keep log as evidenced by report from member  [facility] CM Short Term Goal #1 Start Date  02/03/18  [facility] CM Short Term Goal #1 Met Date  03/02/18  Interventions for Short Term Goal #1  Encouraged to start checking her CBGs again, Reinforced importance of knowing what her blood sugars are running, Reviewed target ranges for blood sugars, Instructed to log CBGs on log sheet that she received in the diabetes packet  THN CM Short Term Goal #2   Member will call Health team concierge to get Silver Sneakers information in the next 2 weeks as reported by member  [facility] CM Short Term Goal #2 Start Date  02/03/18  [facility] CM Short Term Goal #2 Met Date  03/02/18  Interventions for Short Term Goal #2  Praised for getting [employer] card, Encouraged to look into classes or going to the pool at the [facility]   [facility] CM Short Term Goal #3  Member will limit sweet tea to three times a week per report of member for the next 30 days  THN CM Short Term Goal #3 Start Date  03/02/18  Interventions for Short [place] Goal #3  Encouraged to decrease the amount of sweet tea she drinks and to  try drinking water or someother type of sugar free drink, Member agrees to try to limit to 3 times a week,      Plan to follow up with pt in one month to continue education on diabetes self management  Peter Garter RN, [place] Management Coordinator [facility] Care Management [phone]

## 2018-03-03 ENCOUNTER — Ambulatory Visit: Payer: Self-pay | Admitting: *Deleted

## 2018-03-03 ENCOUNTER — Ambulatory Visit: Payer: Self-pay | Admitting: Pharmacist

## 2018-03-03 ENCOUNTER — Other Ambulatory Visit: Payer: Self-pay | Admitting: Pharmacist

## 2018-03-03 ENCOUNTER — Other Ambulatory Visit: Payer: Self-pay | Admitting: *Deleted

## 2018-03-03 NOTE — Patient Outreach (Signed)
[facility]) Care Management  03/03/2018  BRITTLEY REGNER 1951/05/08 [id number]   CSW attempted to reach pt by phone today to introduce self and follow up regarding initial phone outreach made by Nat Christen, LCSW.  CSW was unable to reach pt by phone today and has left a HIPPA complaint voice message. CSW noted [facility] has also attempted to reach pt and has sent  unsuccessful outreach letter.  CSW will  attempt to reach pt later this week if no return call is received.   [name], MSW, [place] Worker  [place] [phone]

## 2018-03-03 NOTE — Patient Outreach (Signed)
[place] [facility]) Care Management  03/03/2018  Alice Kemp Feb 27, 1951 [id number]   67 y.o. year old female referred to [place] for Medication Management and Medication Assistance (Insulin) Referred by Peter Garter, [facility] RN for medication assistance with insulin.   Was unable to reach patient via telephone today and have left HIPAA compliant voicemail asking patient to return my call. (unsuccessful outreach #3).   Plan: Will send unsuccessful outreach letter to patient and close pharmacy episode in 10 business days. Patient has been experiencing personal hardship (death of family member) so I am slightly modifying standard workflow.   Ruben Reason, PharmD Clinical Pharmacist, [facility] [facility] [phone]

## 2018-03-05 ENCOUNTER — Other Ambulatory Visit: Payer: Self-pay | Admitting: *Deleted

## 2018-03-05 ENCOUNTER — Ambulatory Visit: Payer: Self-pay | Admitting: Pharmacist

## 2018-03-05 NOTE — Patient Outreach (Signed)
[facility] [facility]) Care Management  03/05/2018  Alice Kemp September 10, 1951 [id number]   CSW was able to reach pt by phone. Identity confirmed and CSW introduced self and reason for call. Pt acknowledges that she has lost her father just a few weeks ago; sharing, "I am a daddy's girl". She has been so busy caring for her mother that she has not been able to grieve.  CSW acknowledged her feelings and talked with her about grief and loss and shared info about Bereavement counseling through the local Hospice agency.  Pt receptive to [place] mailing her info on the program and a phone call follow up in the next 10 days for further discussion and assessment of CSW needs/support.    [name], MSW, [place] Worker  [place] [phone]

## 2018-03-10 ENCOUNTER — Other Ambulatory Visit: Payer: Self-pay | Admitting: Pharmacist

## 2018-03-10 NOTE — Patient Outreach (Signed)
[facility]) Care Management  [place]   03/10/2018  Alice Kemp 10/25/1950 [id number]  Subjective: 67 y.o. year old female referred to [place] for Medication Management (Initial pharmacy medication review) and Medication Assistance (Insulin) Referred by Peter Garter, [facility] RN, for assistance affording insulin.   PMH s/f: congestive heart failure; OSA; hypertension; diabetes mellitus Type 2; stage III kidney disease; gout; depression; morbid obesity; lumbar radiculopathy; cervical radiculopathy  Patient with [employer] Medicare Advantage plan.   Patient confirms identity with HIPAA-identifiers x2 and gives verbal consent to speak over the phone about medications.    Medication Adherence: Patient admits to not being very adherent with her medications, particularly with vitamins and other OTCs. She has had a lot of family stress recently: father was hospitalized and passed away.   Medication Assistance:  Patient prefers to use Novolin Flex Pens 70/30 but is hesitant to get them because she doesn't want to fall into the donut hole or Medicare Gap. She is not currently in the gap as she states her recent insulin copay was $45.   Medication Management:  Patient states she feels she manages her medications on her own "okay" but admits she has gotten "off track" recently. Checks BG about 1-2x per day (2x is goal but often only remembers once)   Objective:   Current Medications: Current Outpatient Medications  Medication Sig Dispense Refill  . allopurinol (ZYLOPRIM) 100 MG tablet Take 1 tablet (100 mg total) by mouth daily. 90 tablet 3  . amitriptyline (ELAVIL) 10 MG tablet at bedtime as needed.   5  . aspirin 81 MG tablet Take 81 mg by mouth daily. Reported on 12/24/2015    . buPROPion (WELLBUTRIN XL) 300 MG 24 hr tablet Take 1 tablet (300 mg total) by mouth daily. 30 tablet 0  . celecoxib (CELEBREX) 100 MG capsule Take 1 capsule (100 mg total)  by mouth daily as needed. Yearly physical w/labs due in August must see MD for future refills 90 capsule 0  . Coenzyme Q10 (EQL COQ10) 300 MG CAPS Take 1 capsule (300 mg total) daily at 12 noon by mouth. 30 capsule 0  . colchicine 0.6 MG tablet TAKE 2 TABLETS BY MOUTH AT ONSET OF FLARE THEN TAKE 1 TABLET 1 HOUR LATER 15 tablet 0  . DULoxetine (CYMBALTA) 30 MG capsule Take 1 capsule (30 mg total) by mouth 2 (two) times daily. 60 capsule 0  . furosemide (LASIX) 80 MG tablet Take 1 tablet (80 mg total) by mouth daily. 90 tablet 3  . gabapentin (NEURONTIN) 300 MG capsule 300 mg 3 (three) times daily.     Marland Kitchen levocetirizine (XYZAL) 5 MG tablet TAKE 1 TABLET BY MOUTH EVERY EVENING 90 tablet 3  . levothyroxine (SYNTHROID, LEVOTHROID) 88 MCG tablet Take 88 mcg by mouth daily before breakfast.    . losartan (COZAAR) 25 MG tablet Take 1 tablet (25 mg total) by mouth daily. -- Office visit needed for further refills 90 tablet 0  . Magnesium 100 MG CAPS Take 2 capsules by mouth daily.    . rosuvastatin (CRESTOR) 5 MG tablet Take 1 tablet (5 mg total) by mouth daily. 90 tablet 3  . spironolactone (ALDACTONE) 25 MG tablet TAKE 1 TABLET (25 MG TOTAL) BY MOUTH DAILY. 90 tablet 3  . traMADol (ULTRAM) 50 MG tablet Take by mouth every 12 (twelve) hours as needed.    . vitamin B-12 (CYANOCOBALAMIN) 500 MCG tablet Take 500 mcg by mouth 2 (two) times  daily.    . Vitamin D, Ergocalciferol, (DRISDOL) 50000 units CAPS capsule Take 1 capsule (50,000 Units total) by mouth every 3 (three) days. 10 capsule 0  . Alpha-Lipoic Acid 100 MG CAPS Take by mouth.    . fluticasone (CUTIVATE) 0.05 % cream APPLY 3 TIMES A DAY AS NEEDED FOR ITCHING (Patient not taking: Reported on 03/10/2018) 60 g 1  . NOVOLOG MIX 70/30 (70-30) 100 UNIT/ML injection Inject into the skin. 85 units breakfast , 55 units lunch and dinner    . NOVOLOG MIX 70/30 FLEXPEN (70-30) 100 UNIT/ML FlexPen Inject 85 units with breakfast, 60 units with lunch and 85 units  with dinner. (Patient not taking: Reported on 02/03/2018) 225 mL 3  . ONETOUCH VERIO test strip   3   No current facility-administered medications for this visit.     Functional Status: In your present state of health, do you have any difficulty performing the following activities: 02/03/2018  Hearing? N  Vision? Y  Difficulty concentrating or making decisions? N  Walking or climbing stairs? Y  Dressing or bathing? N  Doing errands, shopping? N  Preparing Food and eating ? N  Using the Toilet? N  In the past six months, have you accidently leaked urine? Y  Do you have problems with loss of bowel control? N  Managing your Medications? N  Managing your Finances? N  Housekeeping or managing your Housekeeping? Y  Some recent data might be hidden    Fall/Depression Screening: Fall Risk  02/03/2018 01/08/2016  Falls in the past year? Yes Yes  Number falls in past yr: 2 or more 2 or more  Injury with Fall? Yes Yes  Comment fell off porch scraped knees -  Risk Factor Category  High Fall Risk -  Risk for fall due to : History of fall(s);Impaired balance/gait -  Follow up Education provided -   [facility] 2/9 Scores 02/03/2018 07/29/2017 01/27/2017 01/08/2016  PHQ - 2 Score 4 6 5 6   PHQ- 9 Score 15 22 20 12     Assessment/Plan: 1. Reviewed medication list with patient and updated Epic medication list to reflect history.  2. Contact [employer] to further assess patient's pharmacy claim history and how close patient might be to entering Medicare Gap, as well as discovering patient's TROOP year to date.  3. Apply for Medicare Extra Help at next appointment if patient qualifies, and then proceed with drug company specific medication assistance programs if patient meets eligibility requirements.   Ruben Reason, PharmD Clinical Pharmacist, [place] [facility] [phone]

## 2018-03-12 ENCOUNTER — Ambulatory Visit: Payer: Self-pay | Admitting: *Deleted

## 2018-03-16 ENCOUNTER — Ambulatory Visit: Payer: Self-pay | Admitting: *Deleted

## 2018-03-16 ENCOUNTER — Encounter: Payer: Self-pay | Admitting: *Deleted

## 2018-03-16 ENCOUNTER — Ambulatory Visit: Payer: Self-pay | Admitting: Pharmacist

## 2018-03-16 ENCOUNTER — Other Ambulatory Visit: Payer: Self-pay | Admitting: *Deleted

## 2018-03-16 NOTE — Patient Outreach (Signed)
[name]) Care Management  03/16/2018  Alice Kemp [birth year] [id number]   CSW attempted to reach pt by phone again today without success. CSW will send pt an unsuccessful outreach letter and try again later this week.  [name], MSW, [place] Worker  [place] [phone]

## 2018-03-18 ENCOUNTER — Other Ambulatory Visit: Payer: Self-pay | Admitting: *Deleted

## 2018-03-18 ENCOUNTER — Other Ambulatory Visit: Payer: Self-pay | Admitting: Pharmacist

## 2018-03-18 NOTE — Patient Outreach (Signed)
[facility]) Care Management  03/18/2018  BLUE RUGGERIO 06-09-1951 [id number]   Subjective: 67 y.o. year old female referred to [place] for Medication Management (Initial pharmacy medication review) and Medication Assistance (Insulin) Referred by Peter Garter, [facility] RN, for assistance affording insulin.  Patient with [employer] Medicare Advantage plan.    Patient confirms identity with HIPAA-identifiers x2 and gives verbal consent to speak over the phone about medications. She states she "is not doing so well today".   Medication Assistance:  Received TROOP report from Ples Specter at [employer] Patient has spent $106.86 on prescriptions year to date. The plan has spent $2678.02 on prescriptions year to date (about $1200 from reaching donut hole). Patient is over income limit to apply to Medicare Extra Help program. She states she has about 2 weeks of her Novolog Mix 70/30 FlexPen left.   Plan:  At this point, we are nearly out of options to assist the patient in affording her medications. I will assess her formulary and share lower cost alternatives with Dr. Asa Lente. Patient is not anywhere close to the $1000 out of pocket prescription spend that Novolog Flexpen medication assistance program requires. Follow up in 3-4 business days.   Ruben Reason, PharmD Clinical Pharmacist, [place] [facility] [phone]

## 2018-03-18 NOTE — Patient Outreach (Signed)
[place]) Care Management  03/18/2018  ADITI ROVIRA 08-Sep-1951 [id number]   CSW spoke with pt who reports she is doing ok; still handling her father's death and "working through everything".  She received the info mailed to her on bereavement/grief counseling and is "not ready or able " to prioritize this just yet.   CSW offered support and validated what she is doing to help her mother and to finalize her father's estate.  Encouraged her to take time to grieve and remember him; especially with fathers day upon [name].   CSW offered to call back in 1-2 weeks to see if she has further needs or interest that CSW can assist with .   [name], MSW, [place] Worker  [place] [phone]

## 2018-03-23 ENCOUNTER — Other Ambulatory Visit: Payer: Self-pay

## 2018-03-23 NOTE — Patient Outreach (Signed)
[place] [facility]) Care Management  03/23/2018  Alice Kemp [birth year] [id number]   Member called and left message to cancel today's follow up call as she has to take her Mother to the doctor. Plan to reschedule follow up call 03/25/18 Peter Garter RN, [facility], CDE Care Management Coordinator [facility] Care Management [phone]

## 2018-03-25 ENCOUNTER — Other Ambulatory Visit: Payer: Self-pay

## 2018-03-25 ENCOUNTER — Ambulatory Visit: Payer: Self-pay | Admitting: Pharmacist

## 2018-03-25 NOTE — Patient Outreach (Signed)
[place] [facility]) Care Management  03/25/2018  Alice Kemp [birth year] [id number]   Diabetes Quality Program follow up call. Member requests that visit be rescheduled until next week. Scheduled call for 04/02/18. Peter Garter RN, Jackquline [place], CDE Care Management Coordinator [facility] Care Management [phone]

## 2018-03-26 ENCOUNTER — Other Ambulatory Visit: Payer: Self-pay | Admitting: Pharmacist

## 2018-03-26 NOTE — Patient Outreach (Signed)
[facility]) Care Management  03/26/2018  Alice Kemp 1951/07/08 [id number]  67 y.o. year old female referred to [place] for Medication Assistance (Novolog) and Care Coordination (Pharmacy episode closure)  Patient with [employer] Medicare Advantage plan.   Patient confirms identity with HIPAA-identifiers x2 and gives verbal consent to speak over the phone about medications.    Subjective:  Medication Assistance: Unfortunately, in assessing patient's formulary, there is not a lower cost alternative at the current time. Patient has not spent enough out of pocket ($106 in early to mid June 2019) to qualify to apply for insulin copay assistance programs (minimum out of pocket expenditure threshold is $1000). Patient is currently using vials of Novolin 70/30 mix to save money.   Plan:  Will close pharmacy episode for the time being. Provided patient with my contact information should she have any clinical pharmacy need in the future. Patient verbalized understanding. Will update other members of the care team and send primary care provider a discipline closure letter.   Ruben Reason, PharmD Clinical Pharmacist, [place] [facility] [phone]

## 2018-03-31 ENCOUNTER — Other Ambulatory Visit: Payer: Self-pay | Admitting: *Deleted

## 2018-03-31 NOTE — Patient Outreach (Deleted)
[facility]) Care Management  [facility] Social Work  03/31/2018  Alice Kemp 1951-06-09 [id number]  Subjective:    Objective:   Encounter Medications:  Outpatient Encounter Medications as of 03/31/2018  Medication Sig Note  . allopurinol (ZYLOPRIM) 100 MG tablet Take 1 tablet (100 mg total) by mouth daily.   . Alpha-Lipoic Acid 100 MG CAPS Take by mouth.   Marland Kitchen amitriptyline (ELAVIL) 10 MG tablet at bedtime as needed.  04/10/2015: Received from: External Pharmacy  . aspirin 81 MG tablet Take 81 mg by mouth daily. Reported on 12/24/2015   . buPROPion (WELLBUTRIN XL) 300 MG 24 hr tablet Take 1 tablet (300 mg total) by mouth daily.   . celecoxib (CELEBREX) 100 MG capsule Take 1 capsule (100 mg total) by mouth daily as needed. Yearly physical w/labs due in August must see MD for future refills   . Coenzyme Q10 (EQL COQ10) 300 MG CAPS Take 1 capsule (300 mg total) daily at 12 noon by mouth.   . colchicine 0.6 MG tablet TAKE 2 TABLETS BY MOUTH AT ONSET OF FLARE THEN TAKE 1 TABLET 1 HOUR LATER   . DULoxetine (CYMBALTA) 30 MG capsule Take 1 capsule (30 mg total) by mouth 2 (two) times daily.   . fluticasone (CUTIVATE) 0.05 % cream APPLY 3 TIMES A DAY AS NEEDED FOR ITCHING (Patient not taking: Reported on 03/10/2018)   . furosemide (LASIX) 80 MG tablet Take 1 tablet (80 mg total) by mouth daily.   Marland Kitchen gabapentin (NEURONTIN) 300 MG capsule 300 mg 3 (three) times daily.  06/02/2016: Received from: External Pharmacy  . levocetirizine (XYZAL) 5 MG tablet TAKE 1 TABLET BY MOUTH EVERY EVENING   . levothyroxine (SYNTHROID, LEVOTHROID) 88 MCG tablet Take 88 mcg by mouth daily before breakfast.   . losartan (COZAAR) 25 MG tablet Take 1 tablet (25 mg total) by mouth daily. -- Office visit needed for further refills   . Magnesium 100 MG CAPS Take 2 capsules by mouth daily.   Marland Kitchen NOVOLOG MIX 70/30 (70-30) 100 UNIT/ML injection Inject into the skin. 85 units breakfast , 55 units lunch and dinner   .  NOVOLOG MIX 70/30 FLEXPEN (70-30) 100 UNIT/ML FlexPen Inject 85 units with breakfast, 60 units with lunch and 85 units with dinner. (Patient not taking: Reported on 02/03/2018)   . ONETOUCH VERIO test strip  06/06/2015: Received from: External Pharmacy  . rosuvastatin (CRESTOR) 5 MG tablet Take 1 tablet (5 mg total) by mouth daily.   Marland Kitchen spironolactone (ALDACTONE) 25 MG tablet TAKE 1 TABLET (25 MG TOTAL) BY MOUTH DAILY.   . traMADol (ULTRAM) 50 MG tablet Take by mouth every 12 (twelve) hours as needed.   . vitamin B-12 (CYANOCOBALAMIN) 500 MCG tablet Take 500 mcg by mouth 2 (two) times daily. 03/10/2018: Remembers about 3x week  . Vitamin D, Ergocalciferol, (DRISDOL) 50000 units CAPS capsule Take 1 capsule (50,000 Units total) by mouth every 3 (three) days. 03/10/2018: MWF   No facility-administered encounter medications on file as of 03/31/2018.     Functional Status:  In your present state of health, do you have any difficulty performing the following activities: 02/03/2018  Hearing? N  Vision? Y  Difficulty concentrating or making decisions? N  Walking or climbing stairs? Y  Dressing or bathing? N  Doing errands, shopping? N  Preparing Food and eating ? N  Using the Toilet? N  In the past six months, have you accidently leaked urine? Y  Do you have problems  with loss of bowel control? N  Managing your Medications? N  Managing your Finances? N  Housekeeping or managing your Housekeeping? Y  Some recent data might be hidden    Fall/Depression Screening:  PHQ 2/9 Scores 02/03/2018 07/29/2017 01/27/2017 01/08/2016  PHQ - 2 Score 4 6 5 6   PHQ- 9 Score 15 22 20 12     Assessment:   Plan:

## 2018-03-31 NOTE — Patient Outreach (Signed)
[facility]) Care Management  03/31/2018  SUVI ARCHULETTA 01/29/1951 [id number]   CSW spoke with pt by phone today. She is continuing to feel grief and loss from the recent death of her father; stating, "I have my days".  She is helping to care for her mother and worries about her mothers state and grief.  CSW offered support, validation and an opportunity for pt to have someone listen.  CSW reminded her of the grief support programs offered in the area that may be beneficial for both she and her mother.  CSW provided her with the # and info related to the program dates and encouraged her to try to attend the next group meeting in July (4th Tuesday of each month at 6pm). Pt requests a follow up call in a few weeks for follow up/support.  CSW will plan f/u call in 2-3 weeks.  [name], MSW, [place] Worker  [place] [phone]

## 2018-04-02 ENCOUNTER — Other Ambulatory Visit: Payer: Self-pay

## 2018-04-02 NOTE — Patient Outreach (Signed)
[facility]) Care Management  04/02/2018  Alice Kemp 09/28/1951 [id number]   Telephone call for follow up visit for diabetes chronic care improvement program  Member states that she has not been taking care of herself and that she does not feel like working on her diabetes at this time.  States she has received the resources from the social worker about grief counseling but she has not felt like contacting yet.  States she has not been checking her blood sugars or watching her diet closely.  Encouraged to contact the grief counseling program and encouraged to call the Social worker if needed.  Encouraged to start making [place] steps to care for herself and to try watching her diet. Instructed to call her primary care provider if her grief worsens to see if she would need to have her antidepressant adjusted.  Discussed continuing with diabetes program and she is willing to have RNCM call her again in one month to reevaluate her situation.  Plan to make follow up call in one month.  Peter Garter RN, Jackquline Denmark, CDE Care Management Coordinator [facility] Care Management [phone]

## 2018-04-16 ENCOUNTER — Telehealth: Payer: Self-pay | Admitting: *Deleted

## 2018-04-16 ENCOUNTER — Other Ambulatory Visit: Payer: Self-pay | Admitting: *Deleted

## 2018-04-16 ENCOUNTER — Ambulatory Visit: Payer: Self-pay | Admitting: *Deleted

## 2018-04-16 ENCOUNTER — Other Ambulatory Visit: Payer: Self-pay | Admitting: Internal Medicine

## 2018-04-16 ENCOUNTER — Encounter: Payer: Self-pay | Admitting: *Deleted

## 2018-04-16 MED FILL — COLCHICINE 0.6 MG TABS: 0.6 | 5 days supply | Qty: 15 | Fill #0

## 2018-04-16 NOTE — Telephone Encounter (Signed)
Spoke with pt to advise RX has been sent to [facility]

## 2018-04-16 NOTE — Telephone Encounter (Signed)
This encounter was created in error - please disregard.

## 2018-04-16 NOTE — Telephone Encounter (Signed)
Pt states "Bad gout flare up." Was told she needed appt before refill available. Pt has appt with Dr. Quay Burow 05/11/18, LOV with Dr. Quay Burow 05/07/17.  Refill of Colchicine  by Historical provider [place] 08/11/17  #30  0 refills   [place]. Please advise: [phone]

## 2018-04-16 NOTE — Patient Outreach (Signed)
[facility]) Care Management  04/16/2018  THETIS SCHWIMMER 02-Dec-1950 [id number]   CSW was unable to reach pt today by phone and left a HIPPA compliant voice message.  CSW will try pt again in 3-4 days if no return call is received.  CSW will also send pt an unsuccessful outreach letter in an attempt to reconnect with her.[name], MSW, [place] Worker  [place] [phone]

## 2018-04-19 DIAGNOSIS — E113519 Type 2 diabetes mellitus with proliferative diabetic retinopathy with macular edema, unspecified eye: Secondary | ICD-10-CM | POA: Diagnosis not present

## 2018-04-19 DIAGNOSIS — H40023 Open angle with borderline findings, high risk, bilateral: Secondary | ICD-10-CM | POA: Diagnosis not present

## 2018-04-20 ENCOUNTER — Ambulatory Visit: Payer: Self-pay | Admitting: *Deleted

## 2018-04-20 ENCOUNTER — Other Ambulatory Visit: Payer: Self-pay | Admitting: *Deleted

## 2018-04-20 NOTE — Patient Outreach (Signed)
[place] [facility]) Care Management  04/20/2018  Alice Kemp 10-29-1950 [id number]   CSW was unable to reach pt by phone today. CSW will try again next week.   [name], MSW, [place] Worker  [place] [phone]

## 2018-04-27 ENCOUNTER — Ambulatory Visit: Payer: Self-pay

## 2018-04-28 ENCOUNTER — Other Ambulatory Visit: Payer: Self-pay | Admitting: *Deleted

## 2018-04-28 NOTE — Patient Outreach (Signed)
[facility]) Care Management  [facility] Social Work  04/28/2018  Alice Kemp [birth year] [id number]  Subjective:  "I am feeing worse"  Objective: [facility] CSW to assist patient and family with community based resources to aide in their well-being, quality of life and overall safety and needs.    Encounter Medications:  Outpatient Encounter Medications as of 04/28/2018  Medication Sig Note  . allopurinol (ZYLOPRIM) 100 MG tablet Take 1 tablet (100 mg total) by mouth daily.   . Alpha-Lipoic Acid 100 MG CAPS Take by mouth.   Marland Kitchen amitriptyline (ELAVIL) 10 MG tablet at bedtime as needed.  04/10/2015: Received from: External Pharmacy  . aspirin 81 MG tablet Take 81 mg by mouth daily. Reported on 12/24/2015   . buPROPion (WELLBUTRIN XL) 300 MG 24 hr tablet Take 1 tablet (300 mg total) by mouth daily.   . celecoxib (CELEBREX) 100 MG capsule Take 1 capsule (100 mg total) by mouth daily as needed. Yearly physical w/labs due in August must see MD for future refills   . Coenzyme Q10 (EQL COQ10) 300 MG CAPS Take 1 capsule (300 mg total) daily at 12 noon by mouth.   . colchicine 0.6 MG tablet TAKE 2 TABLETS BY MOUTH AT ONSET OF FLARE THEN TAKE 1 TABLET 1 HOUR LATER   . DULoxetine (CYMBALTA) 30 MG capsule Take 1 capsule (30 mg total) by mouth 2 (two) times daily.   . fluticasone (CUTIVATE) 0.05 % cream APPLY 3 TIMES A DAY AS NEEDED FOR ITCHING (Patient not taking: Reported on 03/10/2018)   . furosemide (LASIX) 80 MG tablet Take 1 tablet (80 mg total) by mouth daily.   Marland Kitchen gabapentin (NEURONTIN) 300 MG capsule 300 mg 3 (three) times daily.  06/02/2016: Received from: External Pharmacy  . levocetirizine (XYZAL) 5 MG tablet TAKE 1 TABLET BY MOUTH EVERY EVENING   . levothyroxine (SYNTHROID, LEVOTHROID) 88 MCG tablet Take 88 mcg by mouth daily before breakfast.   . losartan (COZAAR) 25 MG tablet Take 1 tablet (25 mg total) by mouth daily. -- Office visit needed for further refills   . Magnesium 100 MG CAPS  Take 2 capsules by mouth daily.   Marland Kitchen NOVOLOG MIX 70/30 (70-30) 100 UNIT/ML injection Inject into the skin. 85 units breakfast , 55 units lunch and dinner   . NOVOLOG MIX 70/30 FLEXPEN (70-30) 100 UNIT/ML FlexPen Inject 85 units with breakfast, 60 units with lunch and 85 units with dinner. (Patient not taking: Reported on 02/03/2018)   . ONETOUCH VERIO test strip  06/06/2015: Received from: External Pharmacy  . rosuvastatin (CRESTOR) 5 MG tablet Take 1 tablet (5 mg total) by mouth daily.   Marland Kitchen spironolactone (ALDACTONE) 25 MG tablet TAKE 1 TABLET (25 MG TOTAL) BY MOUTH DAILY.   . traMADol (ULTRAM) 50 MG tablet Take by mouth every 12 (twelve) hours as needed.   . vitamin B-12 (CYANOCOBALAMIN) 500 MCG tablet Take 500 mcg by mouth 2 (two) times daily. 03/10/2018: Remembers about 3x week  . Vitamin D, Ergocalciferol, (DRISDOL) 50000 units CAPS capsule Take 1 capsule (50,000 Units total) by mouth every 3 (three) days. 03/10/2018: MWF   No facility-administered encounter medications on file as of 04/28/2018.     Functional Status:  In your present state of health, do you have any difficulty performing the following activities: 02/03/2018  Hearing? N  Vision? Y  Difficulty concentrating or making decisions? N  Walking or climbing stairs? Y  Dressing or bathing? N  Doing errands, shopping? N  Preparing Food and eating ? N  Using the Toilet? N  In the past six months, have you accidently leaked urine? Y  Do you have problems with loss of bowel control? N  Managing your Medications? N  Managing your Finances? N  Housekeeping or managing your Housekeeping? Y  Some recent data might be hidden    Fall/Depression Screening:  PHQ 2/9 Scores 04/28/2018 02/03/2018 07/29/2017 01/27/2017 01/08/2016  PHQ - 2 Score 6 4 6 5 6   PHQ- 9 Score 13 15 22 20 12     Assessment:  CSW had lengthy phone conversation with pt today.  Her depression is; she admits to not taking her meds as prescribed. "I have no motivation or desires".   CSW completed depression screening and discussed with pt that her symptoms are likely higher/worse due to noncompliance. She is interested in getting better and understands and accepts plans as follows in care plan.   Plan:  [facility] CM Care Plan Problem One     Most Recent Value  Care Plan Problem One  Patient with signifcant signs/symptoms of depression.  Role Documenting the Problem One  Clinical Social Worker  Care Plan for Problem One  Active  [facility] Long Term Goal   Patient will have less signs/symptoms and score less than 5 on PHQ9 in the next 60 days.   [facility] Long Term Goal Start Date  04/28/18  Interventions for Problem One Long Term Goal  CSW completed PHQ9 screening with pt as well as general assessment both of which showed significant apathy, lack of motivation and a sense of being overwhelmed. Pt reports not taking her RX as ordered by MD. CSW encouraged pt to take her RX as prescribed. CSW encouraged pt to discuss with her PCP at visit on 05/11/2018 her depression.  Pt agrees to take RX as prescibed beginnig today. Pt also encouraged to reach out to her PCP or myself if questions or needs arise. Pt denies suicidal thoughts.   [facility] CM Short Term Goal #1   Patient will report receipt of community resources mailed for mental health support. in the next 14 days.   [facility] CM Short Term Goal #1 Start Date  04/28/18  Interventions for Short Term Goal #1  CSW will mail resources to pt to review and consider. Pt encouraged to consider mental health counseling support.   [facility] CM Short Term Goal #2   Pt will report taking her RX's as prescribed over the next 7 days.   [facility] CM Short Term Goal #2 Start Date  04/28/18  Interventions for Short Term Goal #2  CSW educated pt on the importance of compliance with RX . Pt agrees to take RX today and daily as prescribed. Pt encouraged to discuss RX issues with PCP.   [facility] CM Short Term Goal #3  Pt will seek employment and review info mailed to aide in finding work in the  next 30 days.   [facility] CM Short Term Goal #3 Start Date  04/28/18  Interventions for Short Tern Goal #3  CSW to mail pt info on [employer]. CSW encouraged pt to continue to outreach contacts for possible job opportunitues. Pt is intereseted and eager to work to "feel valued".      CSW plans f/u call Friday for updates.   [name], MSW, [place] Worker   [phone]

## 2018-04-29 ENCOUNTER — Other Ambulatory Visit: Payer: Self-pay

## 2018-04-29 NOTE — Patient Outreach (Signed)
[facility]) Care Management  [place]  04/29/2018   Alice Kemp 03-28-1951 [id number]   Telephone call for follow up visit for diabetes chronic care improvement program   Subjective: States she talked to the social worker yesterday and it did help her to talk about how she is feeling.  States she has not been doing any thing to care for herself and she mostly stays in her bedroom.  States she has not been taking any of her medications other than her insulin.  States she has not been watching her diet and has not checked her blood sugars.  States she is to see Dr. Quay Burow on 05/11/18 and she is going back to see Dr. Leafy Ro on 05/17/18.    Objective: Member has hx of Type 2 DM, Stage 3 chronic kidney disease, HTN, hyperlipidemia, gout, depression and chronic back pain per review of medical record. Hemoglobin A1C8.1% 02/15/18 per  [employer]([facility])   Encounter Medications:  Outpatient Encounter Medications as of 04/29/2018  Medication Sig Note  . allopurinol (ZYLOPRIM) 100 MG tablet Take 1 tablet (100 mg total) by mouth daily.   Marland Kitchen amitriptyline (ELAVIL) 10 MG tablet at bedtime as needed.  04/10/2015: Received from: External Pharmacy  . colchicine 0.6 MG tablet TAKE 2 TABLETS BY MOUTH AT ONSET OF FLARE THEN TAKE 1 TABLET 1 HOUR LATER   . NOVOLOG MIX 70/30 (70-30) 100 UNIT/ML injection Inject into the skin. 85 units breakfast , 55 units lunch and dinner   . ONETOUCH VERIO test strip  06/06/2015: Received from: External Pharmacy  . Alpha-Lipoic Acid 100 MG CAPS Take by mouth.   Marland Kitchen aspirin 81 MG tablet Take 81 mg by mouth daily. Reported on 12/24/2015   . buPROPion (WELLBUTRIN XL) 300 MG 24 hr tablet Take 1 tablet (300 mg total) by mouth daily. (Patient not taking: Reported on 04/29/2018)   . celecoxib (CELEBREX) 100 MG capsule Take 1 capsule (100 mg total) by mouth daily as needed. Yearly physical w/labs due in August must see MD for future refills (Patient not  taking: Reported on 04/29/2018)   . Coenzyme Q10 (EQL COQ10) 300 MG CAPS Take 1 capsule (300 mg total) daily at 12 noon by mouth. (Patient not taking: Reported on 04/29/2018)   . DULoxetine (CYMBALTA) 30 MG capsule Take 1 capsule (30 mg total) by mouth 2 (two) times daily. (Patient not taking: Reported on 04/29/2018)   . fluticasone (CUTIVATE) 0.05 % cream APPLY 3 TIMES A DAY AS NEEDED FOR ITCHING (Patient not taking: Reported on 03/10/2018)   . furosemide (LASIX) 80 MG tablet Take 1 tablet (80 mg total) by mouth daily. (Patient not taking: Reported on 04/29/2018)   . gabapentin (NEURONTIN) 300 MG capsule 300 mg 3 (three) times daily.  06/02/2016: Received from: External Pharmacy  . levocetirizine (XYZAL) 5 MG tablet TAKE 1 TABLET BY MOUTH EVERY EVENING (Patient not taking: Reported on 04/29/2018)   . levothyroxine (SYNTHROID, LEVOTHROID) 88 MCG tablet Take 88 mcg by mouth daily before breakfast.   . losartan (COZAAR) 25 MG tablet Take 1 tablet (25 mg total) by mouth daily. -- Office visit needed for further refills (Patient not taking: Reported on 04/29/2018)   . Magnesium 100 MG CAPS Take 2 capsules by mouth daily.   Marland Kitchen NOVOLOG MIX 70/30 FLEXPEN (70-30) 100 UNIT/ML FlexPen Inject 85 units with breakfast, 60 units with lunch and 85 units with dinner. (Patient not taking: Reported on 02/03/2018)   . rosuvastatin (CRESTOR) 5 MG  tablet Take 1 tablet (5 mg total) by mouth daily. (Patient not taking: Reported on 04/29/2018)   . spironolactone (ALDACTONE) 25 MG tablet TAKE 1 TABLET (25 MG TOTAL) BY MOUTH DAILY. (Patient not taking: Reported on 04/29/2018)   . traMADol (ULTRAM) 50 MG tablet Take by mouth every 12 (twelve) hours as needed.   . vitamin B-12 (CYANOCOBALAMIN) 500 MCG tablet Take 500 mcg by mouth 2 (two) times daily. 03/10/2018: Remembers about 3x week  . Vitamin D, Ergocalciferol, (DRISDOL) 50000 units CAPS capsule Take 1 capsule (50,000 Units total) by mouth every 3 (three) days. (Patient not taking: Reported  on 04/29/2018) 03/10/2018: MWF   No facility-administered encounter medications on file as of 04/29/2018.     Functional Status:  In your present state of health, do you have any difficulty performing the following activities: 02/03/2018  Hearing? N  Vision? Y  Difficulty concentrating or making decisions? N  Walking or climbing stairs? Y  Dressing or bathing? N  Doing errands, shopping? N  Preparing Food and eating ? N  Using the Toilet? N  In the past six months, have you accidently leaked urine? Y  Do you have problems with loss of bowel control? N  Managing your Medications? N  Managing your Finances? N  Housekeeping or managing your Housekeeping? Y  Some recent data might be hidden    Fall/Depression Screening: Fall Risk  04/28/2018 02/03/2018 01/08/2016  Falls in the past year? No Yes Yes  Number falls in past yr: - 2 or more 2 or more  Injury with Fall? - Yes Yes  Comment - fell off porch scraped knees -  Risk Factor Category  - High Fall Risk -  Risk for fall due to : - History of fall(s);Impaired balance/gait -  Follow up - Education provided -   [facility] 2/9 Scores 04/28/2018 02/03/2018 07/29/2017 01/27/2017 01/08/2016  PHQ - 2 Score '6 4 6 5 6  '$ PHQ- 9 Score '13 15 22 20 12    '$ Assessment:  Follow up telephone for disease management of Type 2 DM. Member continues to  not meeting diabetes self management goal of hemoglobin A1C less than 7% with last reading 8.1%.   Member has been non-adherent with diet and CBG checks since her Father expired 02/20/18.  Member has not been taking medications including depression medications, but she is taking her insulin.  Member has spoken to Education officer, museum about her depression.  She denies any suicidal thoughts.  Member to see primary care provider on 05/11/18 and Weight Management on 05/17/18. Member up to date with annual dilated eye exam and foot exam.  Member had flu vaccination and pneumonia vaccine in September 2018.   Plan:  [facility] CM Care Plan Problem One      Most Recent Value  Care Plan Problem One  Knowledge deficit related to diabetes self mangement as evidenced by elevated hemoglobin A1C of 8.8%  Role Documenting the Problem One  Care Management [place] for Problem One  Active  [facility] Long Term Goal   Member will decrease hemoglobinA1C by 1% in the next 90 days   [facility] Long Term Goal Start Date  04/29/18  [facility] Long Term Goal Met Date  -- [Continue goal hemoglobin A1C has not been checked]  Interventions for Problem One Long Term Goal  Instructed on the importance of taking care of herself and her diabetes, Encouraged to move her medications into her bedroom to help her take her medications, Member agrees to  take her medications dailly , Instructed to keep her appt with Dr.Burns on 05/11/18 and to discuss her grief with her, Encouraged to call social worker and RNCM if needed   [facility] CM Short Term Goal #1   Member will check CBG once a week for 4 weeks and keep log as evidenced by report from member  [place] #1 Start Date  04/29/18  Interventions for Short Term Goal #1  Instructed on the importance of checking her sugars since she is taking insulin, Agrees to take once a week  [facility] CM Short Term Goal #2   Member will call Health team concierge to get [employer] information in the next 2 weeks as reported by member  [facility] CM Short Term Goal #2 Start Date  02/03/18  [facility] CM Short Term Goal #2 Met Date  03/02/18  [facility] CM Short Term Goal #3  Member will limit sweet tea to three times a week per report of member for the next 30 days  [facility] CM Short Term Goal #3 Start Date  04/29/18  Interventions for Short Tern Goal #3  Encouraged to watch the CHO she eats and drinks    Plan to follow up with member in 3 weeks after her MD appt. Update sent to provider.

## 2018-04-30 ENCOUNTER — Other Ambulatory Visit: Payer: Self-pay | Admitting: *Deleted

## 2018-04-30 NOTE — Patient Outreach (Signed)
[facility]) Care Management  05/01/2018  Alice Kemp 02-18-1951 [id number]   CSW spoke with pt who reports she continues to feel better in regards to her grief/depression. CSW offered to complete full assessment with pt but she declines. She has made an appointment with Hospice SW for bereavement counseling; CSW commended her for this. She is interested in finding work; states she was laid off about 6 years ago. CSW discussed possible opportunities with her and she is interested in pursuing some of these.  CSW will plan to mail pt info on [facility] and follow up by phone in the 5-7 days.[name], MSW, [place] Worker  [place] [phone]

## 2018-05-06 ENCOUNTER — Other Ambulatory Visit: Payer: Self-pay | Admitting: *Deleted

## 2018-05-06 NOTE — Patient Outreach (Signed)
[facility]) Care Management  [facility] Social Work  05/06/2018  KNIYAH KHUN March 24, 1951 [id number]  Subjective:  "I am doing a little better"  Objective: [facility] CSW to assist patient and family with community based resources to aide in their well-being, quality of life and overall safety and needs.    Current Medications:  Current Outpatient Medications  Medication Sig Dispense Refill  . allopurinol (ZYLOPRIM) 100 MG tablet Take 1 tablet (100 mg total) by mouth daily. 90 tablet 3  . Alpha-Lipoic Acid 100 MG CAPS Take by mouth.    Marland Kitchen amitriptyline (ELAVIL) 10 MG tablet at bedtime as needed.   5  . aspirin 81 MG tablet Take 81 mg by mouth daily. Reported on 12/24/2015    . buPROPion (WELLBUTRIN XL) 300 MG 24 hr tablet Take 1 tablet (300 mg total) by mouth daily. (Patient not taking: Reported on 04/29/2018) 30 tablet 0  . celecoxib (CELEBREX) 100 MG capsule Take 1 capsule (100 mg total) by mouth daily as needed. Yearly physical w/labs due in August must see MD for future refills (Patient not taking: Reported on 04/29/2018) 90 capsule 0  . Coenzyme Q10 (EQL COQ10) 300 MG CAPS Take 1 capsule (300 mg total) daily at 12 noon by mouth. (Patient not taking: Reported on 04/29/2018) 30 capsule 0  . colchicine 0.6 MG tablet TAKE 2 TABLETS BY MOUTH AT ONSET OF FLARE THEN TAKE 1 TABLET 1 HOUR LATER 15 tablet 0  . DULoxetine (CYMBALTA) 30 MG capsule Take 1 capsule (30 mg total) by mouth 2 (two) times daily. (Patient not taking: Reported on 04/29/2018) 60 capsule 0  . fluticasone (CUTIVATE) 0.05 % cream APPLY 3 TIMES A DAY AS NEEDED FOR ITCHING (Patient not taking: Reported on 03/10/2018) 60 g 1  . furosemide (LASIX) 80 MG tablet Take 1 tablet (80 mg total) by mouth daily. (Patient not taking: Reported on 04/29/2018) 90 tablet 3  . gabapentin (NEURONTIN) 300 MG capsule 300 mg 3 (three) times daily.     Marland Kitchen levocetirizine (XYZAL) 5 MG tablet TAKE 1 TABLET BY MOUTH EVERY EVENING (Patient not taking:  Reported on 04/29/2018) 90 tablet 3  . levothyroxine (SYNTHROID, LEVOTHROID) 88 MCG tablet Take 88 mcg by mouth daily before breakfast.    . losartan (COZAAR) 25 MG tablet Take 1 tablet (25 mg total) by mouth daily. -- Office visit needed for further refills (Patient not taking: Reported on 04/29/2018) 90 tablet 0  . Magnesium 100 MG CAPS Take 2 capsules by mouth daily.    Marland Kitchen NOVOLOG MIX 70/30 (70-30) 100 UNIT/ML injection Inject into the skin. 85 units breakfast , 55 units lunch and dinner    . NOVOLOG MIX 70/30 FLEXPEN (70-30) 100 UNIT/ML FlexPen Inject 85 units with breakfast, 60 units with lunch and 85 units with dinner. (Patient not taking: Reported on 02/03/2018) 225 mL 3  . ONETOUCH VERIO test strip   3  . rosuvastatin (CRESTOR) 5 MG tablet Take 1 tablet (5 mg total) by mouth daily. (Patient not taking: Reported on 04/29/2018) 90 tablet 3  . spironolactone (ALDACTONE) 25 MG tablet TAKE 1 TABLET (25 MG TOTAL) BY MOUTH DAILY. (Patient not taking: Reported on 04/29/2018) 90 tablet 3  . traMADol (ULTRAM) 50 MG tablet Take by mouth every 12 (twelve) hours as needed.    . vitamin B-12 (CYANOCOBALAMIN) 500 MCG tablet Take 500 mcg by mouth 2 (two) times daily.    . Vitamin D, Ergocalciferol, (DRISDOL) 50000 units CAPS capsule Take 1 capsule (50,000  Units total) by mouth every 3 (three) days. (Patient not taking: Reported on 04/29/2018) 10 capsule 0   No current facility-administered medications for this visit.     Functional Status:  In your present state of health, do you have any difficulty performing the following activities: 02/03/2018  Hearing? N  Vision? Y  Difficulty concentrating or making decisions? N  Walking or climbing stairs? Y  Dressing or bathing? N  Doing errands, shopping? N  Preparing Food and eating ? N  Using the Toilet? N  In the past six months, have you accidently leaked urine? Y  Do you have problems with loss of bowel control? N  Managing your Medications? N  Managing your  Finances? N  Housekeeping or managing your Housekeeping? Y  Some recent data might be hidden    Fall/Depression Screening:  Fall Risk  04/28/2018 02/03/2018 01/08/2016  Falls in the past year? No Yes Yes  Number falls in past yr: - 2 or more 2 or more  Injury with Fall? - Yes Yes  Comment - fell off porch scraped knees -  Risk Factor Category  - High Fall Risk -  Risk for fall due to : - History of fall(s);Impaired balance/gait -  Follow up - Education provided -   [facility] 2/9 Scores 05/06/2018 04/28/2018 02/03/2018 07/29/2017 01/27/2017 01/08/2016  PHQ - 2 Score 4 6 4 6 5 6   PHQ- 9 Score 12 13 15 22 20 12     Assessment:  CSW contacted pt who reported her depression is somewhat better but scored it an "8" with 10 being the worse. She plans to see her PCP next week (05/11/2018) and is encouraged to discuss.  She reports cancelling the Hospice grief counseling appointment because she "spoke to my [place] and my mom". She felt the conversation with her Doristine Bosworth was somewhat helpful and admits her mom left her feeling worse.  She also states her sleep, eating and overall interest/energy are all poor. CSW discussed the likelihood that depression is a major piece of this. She does not want [facility] CSW to plan home visit at this time. She is encouraged to try to eat healthy meals, get rest, journal or write her thoughts/emotions out on paper as a way to let it out. She shared with CSW that she visited the [place] and her dad's plot has not been placed yet. This also left her feeling sadness. She is encouraged to reschedule grief counseling and/or 1:1 therapy with Hospice.      Plan:  [facility] CM Care Plan Problem One     Most Recent Value  Care Plan Problem One  Patient with signifcant signs/symptoms of depression.  Role Documenting the Problem One  Clinical Social Worker  Care Plan for Problem One  Active  [facility] Long Term Goal   Patient will have less signs/symptoms and score less than 5 on PHQ9 in the next 60 days.   [facility]  Long Term Goal Start Date  04/28/18  Interventions for Problem One Long Term Goal  Pt continues to have significant symptoms related to depression,  difficulty with sleep, eating (not eating many meals),etc. She does, however state her depression is "somewhat better".   [facility] CM Short Term Goal #1   Patient will report receipt of community resources mailed for mental health support. in the next 14 days.   [facility] CM Short Term Goal #1 Start Date  04/28/18  Interventions for Short Term Goal #1  Pt has not received material- CSW will  remail info todya.   [facility] CM Short Term Goal #2   Pt will report taking her RX's as prescribed over the next 7 days.   [facility] CM Short Term Goal #2 Start Date  04/28/18  [facility] CM Short Term Goal #3  Pt will seek employment and review info mailed to aide in finding work in the next 30 days.   [facility] CM Short Term Goal #3 Start Date  04/28/18  Interventions for Short Tern Goal #3  CSW will remail info to pt today.       CSW advised pt of plans to update her PCP and to follow up later in the week next week  after the appointment.  [name], MSW, [place] Worker  [place] [phone]

## 2018-05-07 ENCOUNTER — Ambulatory Visit: Payer: Self-pay | Admitting: *Deleted

## 2018-05-10 NOTE — Progress Notes (Signed)
Subjective:    Patient ID: Alice Kemp, female    DOB: 1950/11/26, 67 y.o.   MRN: [id number]  HPI The patient is here for follow up.  Her father died recently.  He had a lot of medical problems, but it was more sudden than was expected.  She has not been doing well.  Her dog died in 12/27/2022 and a close friend died in 25-Feb-2023.  For a while she stopped taking all her medications.  She is taking them now.  She has been taking them regularly for 2 weeks.  She does not want to do anything.  She is still taking care of her mother.  She has been talking to [facility] worker, but unsure how much it is helping.  She did sign out for hospice grief counseling, but canceled.  Depression: She does feel depressed.  She restarted her depression medications 2 weeks ago and has been taking it consistently.  At this point she does not feel that she wants to talk to anyone.  Diabetes, hypothyroidism: She follows with endocrine - last saw her in May.  She is not been eating well and has regained the weight that she lost.  She is currently taking her medication daily as prescribed.  Chronic kidney disease: She follows with nephrology and her function has been stable.  Her blood pressure has been elevated recently and she was advised to start monitoring her blood pressure regularly and she will start that.   Obesity: She is fine with the weight management clinic and has an appointment next week.  She was able to lose 30 pounds, but has regained it because of the stress over the past few months.  She did feel a lot better when she had the weight off.  Medications and allergies reviewed with patient and updated if appropriate.  Patient Active Problem List   Diagnosis Date Noted  . Type 2 diabetes mellitus without complication, with long-term current use of insulin ([place]) 10/29/2017  . Essential hypertension 10/15/2017  . Pain in both lower extremities 01/27/2017  . Lumbar radiculopathy 06/02/2016  . Leg  weakness, bilateral 06/02/2016  . Gout 11/29/2015  . Toe pain, left 07/25/2015  . Palpitations   . Chronic diastolic heart failure ([place]) 09/23/2013  . CKD (chronic kidney disease), stage III ([place])   . Depression 02/11/2013  . Benign neoplasm of colon 06/29/2012  . Stasis dermatitis 01/04/2012  . Cervical radiculopathy at C6 07/24/2011  . Morbid obesity ([place]) 02/14/2011  . Hypothyroidism 02/14/2011  . OBSTRUCTIVE SLEEP APNEA 12/23/2010  . PERICARDIAL EFFUSION 12/12/2010  . Uncontrolled type 2 diabetes mellitus with stage 3 chronic kidney disease ([place]) 12/27/10  . EDEMA 12-27-2010  . DYSPNEA 12-27-10    Current Outpatient Medications on File Prior to Visit  Medication Sig Dispense Refill  . allopurinol (ZYLOPRIM) 100 MG tablet Take 1 tablet (100 mg total) by mouth daily. 90 tablet 3  . Alpha-Lipoic Acid 100 MG CAPS Take by mouth.    Marland Kitchen amitriptyline (ELAVIL) 10 MG tablet at bedtime as needed.   5  . aspirin 81 MG tablet Take 81 mg by mouth daily. Reported on 12/24/2015    . buPROPion (WELLBUTRIN XL) 300 MG 24 hr tablet Take 1 tablet (300 mg total) by mouth daily. 30 tablet 0  . celecoxib (CELEBREX) 100 MG capsule Take 1 capsule (100 mg total) by mouth daily as needed. Yearly physical w/labs due in August must see MD for future refills 90 capsule 0  .  Coenzyme Q10 (EQL COQ10) 300 MG CAPS Take 1 capsule (300 mg total) daily at 12 noon by mouth. 30 capsule 0  . colchicine 0.6 MG tablet TAKE 2 TABLETS BY MOUTH AT ONSET OF FLARE THEN TAKE 1 TABLET 1 HOUR LATER 15 tablet 0  . DULoxetine (CYMBALTA) 30 MG capsule Take 1 capsule (30 mg total) by mouth 2 (two) times daily. 60 capsule 0  . fluticasone (CUTIVATE) 0.05 % cream APPLY 3 TIMES A DAY AS NEEDED FOR ITCHING 60 g 1  . furosemide (LASIX) 80 MG tablet Take 1 tablet (80 mg total) by mouth daily. 90 tablet 3  . gabapentin (NEURONTIN) 300 MG capsule 300 mg 3 (three) times daily.     Marland Kitchen levocetirizine (XYZAL) 5 MG tablet TAKE 1 TABLET BY MOUTH  EVERY EVENING 90 tablet 3  . levothyroxine (SYNTHROID, LEVOTHROID) 88 MCG tablet Take 88 mcg by mouth daily before breakfast.    . losartan (COZAAR) 25 MG tablet Take 1 tablet (25 mg total) by mouth daily. -- Office visit needed for further refills 90 tablet 0  . Magnesium 100 MG CAPS Take 2 capsules by mouth daily.    Marland Kitchen NOVOLOG MIX 70/30 (70-30) 100 UNIT/ML injection Inject into the skin. 85 units breakfast , 55 units lunch and dinner    . NOVOLOG MIX 70/30 FLEXPEN (70-30) 100 UNIT/ML FlexPen Inject 85 units with breakfast, 60 units with lunch and 85 units with dinner. 225 mL 3  . ONETOUCH VERIO test strip   3  . rosuvastatin (CRESTOR) 5 MG tablet Take 1 tablet (5 mg total) by mouth daily. 90 tablet 3  . spironolactone (ALDACTONE) 25 MG tablet TAKE 1 TABLET (25 MG TOTAL) BY MOUTH DAILY. 90 tablet 3  . traMADol (ULTRAM) 50 MG tablet Take by mouth every 12 (twelve) hours as needed.    . vitamin B-12 (CYANOCOBALAMIN) 500 MCG tablet Take 500 mcg by mouth 2 (two) times daily.    . Vitamin D, Ergocalciferol, (DRISDOL) 50000 units CAPS capsule Take 1 capsule (50,000 Units total) by mouth every 3 (three) days. 10 capsule 0   No current facility-administered medications on file prior to visit.     Past Medical History:  Diagnosis Date  . Anxiety   . Arthritis   . Back pain   . Benign neoplasm of colon 06/29/2012  . Breast abscess 07/2011   left; s/p I&D + abx by gen surg  . Cervical radiculopathy at C6   . CKD (chronic kidney disease) stage 3, GFR 30-59 ml/min ([facility])    follows with renal  . Constipation   . Depression   . Diabetes mellitus type II   . Diastolic heart failure ([place]) 09/2013 echo  . Gall bladder inflammation   . Glaucoma   . Gout   . Hyperlipidemia   . Hypothyroidism   . Joint pain   . Kidney disease   . Leg pain   . Morbid obesity ([place])   . OBSTRUCTIVE SLEEP APNEA 12/23/2010   npsg 2012:  AHI 25/hr, severe desat to 68%.   autoset 2013: optimal pressure 13cm   .  PERICARDIAL EFFUSION 12/12/2010  . Pulmonary hypertension ([place])   . Shortness of breath   . Skin lesion of breast 07/09/2012  . Swelling of extremity   . Unspecified hypothyroidism   . Vitamin B12 deficiency   . Vitamin D deficiency     Past Surgical History:  Procedure Laterality Date  . BREAST BIOPSY  1990   Left  .  CATARACT EXTRACTION    . COLONOSCOPY  06/29/2012   Procedure: COLONOSCOPY;  Surgeon: Ladene Artist, MD,FACG;  Location: [facility];  Service: Endoscopy;  Laterality: N/A;  . SHOULDER SURGERY  2010   Left  . TONSILLECTOMY  1964    Social History   Socioeconomic History  . Marital status: Single    Spouse name: Not on file  . Number of children: 0  . Years of education: Not on file  . Highest education level: Not on file  Occupational History  . 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 Financial resource strain: Somewhat hard  . Food insecurity:    Worry: Never true    Inability: Never true  . Transportation needs:    Medical: No    Non-medical: No  Tobacco Use  . Smoking status: Never Smoker  . Smokeless tobacco: Never Used  Substance and Sexual Activity  . Alcohol use: No    Alcohol/week: 0.0 oz    Frequency: Never  . Drug use: No  . Sexual activity: Not on file  Lifestyle  . Physical activity:    Days per week: Not on file    Minutes per session: Not on file  . Stress: Not on file  Relationships  . Social connections:    Talks on phone: Not on file    Gets together: Not on file    Attends religious service: Not on file    Active member of club or organization: Not on file    Attends meetings of clubs or organizations: Not on file    Relationship status: Not on file  Other Topics Concern  . Not on file  Social History Narrative   Lives alone.       Family History  Problem Relation Age of Onset  . Hypertension Mother   . Heart disease Mother   .  Heart attack Mother   . Thyroid disease Mother   . Diabetes Father   . Heart disease Father   . Skin cancer Father   . Cancer Father        skin  . Heart attack Father   . Hyperlipidemia Father   . Colon polyps Father   . Depression Father   . Hypertension Brother   . Skin cancer Paternal Grandfather   . Colon cancer Paternal Grandfather 62  . Breast cancer Paternal Aunt   . Cancer Paternal Aunt        breast  . Diabetes Paternal Uncle   . Diabetes Maternal Grandfather   . Sudden death Neg Hx     Review of Systems  Constitutional: Negative for chills and fever.  Respiratory: Positive for cough (dry cough) and shortness of breath. Negative for wheezing.   Cardiovascular: Positive for leg swelling (minimal at times). Negative for chest pain and palpitations.  Gastrointestinal: Negative for abdominal pain and nausea.       No gerd  Neurological: Negative for light-headedness and headaches.  Psychiatric/Behavioral: Positive for sleep disturbance (some days).       Objective:   Vitals:   05/11/18 1124  BP: (!) 158/86  Pulse: (!) 110  Resp: 18  Temp: 98.4 F (36.9 C)  SpO2: 93%   BP Readings from Last 3 Encounters:  05/11/18 (!) 158/86  01/18/18 107/71  12/17/17 103/68   Wt Readings from Last 3 Encounters:  05/11/18 292 lb (132.5 kg)  01/18/18 273 lb (  123.8 kg)  12/17/17 268 lb (121.6 kg)   Body mass index is 51.73 kg/m.   Physical Exam    Constitutional: Appears well-developed and well-nourished. No distress.  HENT:  Head: Normocephalic and atraumatic.  Neck: Neck supple. No tracheal deviation present. No thyromegaly present.  No cervical lymphadenopathy Cardiovascular: Normal rate, regular rhythm and normal heart sounds.   No murmur heard. No carotid bruit .  No edema Pulmonary/Chest: Effort normal and breath sounds normal. No respiratory distress. No has no wheezes. No rales.  Skin: Skin is warm and dry. Not diaphoretic.  Psychiatric: Normal mood and  affect. Behavior is normal.      Assessment & Plan:    See Problem List for Assessment and Plan of chronic medical problems.

## 2018-05-11 ENCOUNTER — Encounter: Payer: Self-pay | Admitting: Internal Medicine

## 2018-05-11 ENCOUNTER — Ambulatory Visit (INDEPENDENT_AMBULATORY_CARE_PROVIDER_SITE_OTHER): Payer: PPO | Admitting: Internal Medicine

## 2018-05-11 VITALS — BP 158/86 | HR 110 | Temp 98.4°F | Resp 18 | Wt 292.0 lb

## 2018-05-11 DIAGNOSIS — E038 Other specified hypothyroidism: Secondary | ICD-10-CM

## 2018-05-11 DIAGNOSIS — N183 Chronic kidney disease, stage 3 unspecified: Secondary | ICD-10-CM

## 2018-05-11 DIAGNOSIS — E559 Vitamin D deficiency, unspecified: Secondary | ICD-10-CM | POA: Diagnosis not present

## 2018-05-11 DIAGNOSIS — E1122 Type 2 diabetes mellitus with diabetic chronic kidney disease: Secondary | ICD-10-CM | POA: Diagnosis not present

## 2018-05-11 DIAGNOSIS — IMO0002 Reserved for concepts with insufficient information to code with codable children: Secondary | ICD-10-CM

## 2018-05-11 DIAGNOSIS — E1165 Type 2 diabetes mellitus with hyperglycemia: Secondary | ICD-10-CM | POA: Diagnosis not present

## 2018-05-11 DIAGNOSIS — F3289 Other specified depressive episodes: Secondary | ICD-10-CM | POA: Diagnosis not present

## 2018-05-11 MED ORDER — BUPROPION HCL ER (XL) 300 MG PO TB24: 300.0000 mg | ORAL_TABLET | Freq: Every day | ORAL | 1 refills | Status: DC

## 2018-05-11 MED ORDER — DULOXETINE HCL 30 MG PO CPEP: 30.0000 mg | ORAL_CAPSULE | Freq: Two times a day (BID) | ORAL | 1 refills | Status: DC

## 2018-05-11 MED ORDER — VITAMIN D (ERGOCALCIFEROL) 1.25 MG (50000 UNIT) PO CAPS
50000.0000 [IU] | ORAL_CAPSULE | ORAL | 1 refills | Status: DC
Start: 2018-05-11 — End: 2018-09-09

## 2018-05-11 MED FILL — VIT D2 1.25 MG (50,000 UNIT: 1.25 MG | 45 days supply | Qty: 15 | Fill #0

## 2018-05-11 MED FILL — buPROPion HCL ER (XL) 300 M: 300 | 90 days supply | Qty: 90 | Fill #0

## 2018-05-11 MED FILL — DULoxetine HCL 30 MG CPEP: 30 | 90 days supply | Qty: 180 | Fill #0

## 2018-05-11 NOTE — Assessment & Plan Note (Signed)
Following with nephrology-stable Stressed the importance of losing her weight again She will start monitoring her blood pressure to make sure it is controlled

## 2018-05-11 NOTE — Patient Instructions (Addendum)
  Medications reviewed and updated.  No changes recommended at this time.     Please followup in 6 months   

## 2018-05-11 NOTE — Assessment & Plan Note (Signed)
Not ideally controlled-she is still grieving the loss of her dog, best friend and father She did have taken her medication for a while, but did restart it and has been taking it consistently for 2 weeks-she will continue Encouraged her to reconsider hospice grief counseling Stressed the importance of focusing on her health

## 2018-05-11 NOTE — Assessment & Plan Note (Signed)
Management per endocrine 

## 2018-05-11 NOTE — Assessment & Plan Note (Signed)
Has not been eating well or exercising due to the loss of her father and she has regained the weight she lost She is follow-up with weight management clinic next week Encouraged her to restart her weight loss efforts and focus on her own health

## 2018-05-11 NOTE — Assessment & Plan Note (Signed)
Following routinely with endocrine She last saw endocrine in May-A1c reportedly less than 8 Management per endocrine Encouraged her to restart her weight loss efforts

## 2018-05-12 ENCOUNTER — Ambulatory Visit: Payer: PPO | Admitting: *Deleted

## 2018-05-14 ENCOUNTER — Encounter: Payer: Self-pay | Admitting: *Deleted

## 2018-05-14 ENCOUNTER — Ambulatory Visit: Payer: Self-pay | Admitting: *Deleted

## 2018-05-14 ENCOUNTER — Other Ambulatory Visit: Payer: Self-pay | Admitting: *Deleted

## 2018-05-14 NOTE — Patient Outreach (Signed)
[facility]) Care Management  05/14/2018  Alice Kemp 18-Jan-1951 [id number]   CSW was unable to reach pt by phone today for follow up regarding resources and support offered.  CSW left a HIPPA compliant message for pt and will send unsuccesful outreach letter. CSW will outreach pt again in 3-4 business days if no return call is received.   [name], MSW, [place] Worker  [place] [phone]

## 2018-05-17 ENCOUNTER — Ambulatory Visit (INDEPENDENT_AMBULATORY_CARE_PROVIDER_SITE_OTHER): Payer: PPO | Admitting: Family Medicine

## 2018-05-17 VITALS — BP 124/76 | HR 74 | Temp 98.3°F | Ht 63.0 in | Wt 285.0 lb

## 2018-05-17 DIAGNOSIS — Z6841 Body Mass Index (BMI) 40.0 and over, adult: Secondary | ICD-10-CM | POA: Diagnosis not present

## 2018-05-17 DIAGNOSIS — E7849 Other hyperlipidemia: Secondary | ICD-10-CM

## 2018-05-17 DIAGNOSIS — E119 Type 2 diabetes mellitus without complications: Secondary | ICD-10-CM

## 2018-05-17 DIAGNOSIS — E559 Vitamin D deficiency, unspecified: Secondary | ICD-10-CM

## 2018-05-17 DIAGNOSIS — Z794 Long term (current) use of insulin: Secondary | ICD-10-CM | POA: Diagnosis not present

## 2018-05-17 NOTE — Progress Notes (Signed)
Office: [phone]  /  Fax: [phone]   HPI:   Chief Complaint: OBESITY Alice Kemp is here to discuss her progress with her obesity treatment plan. She is on the Pescatarian eating plan and is following her eating plan approximately 25 % of the time. She states she is exercising 0 minutes 0 times per week. [name]'s last visit was >4 months ago. She has been off track with multiple family deaths and her depression makes her stop all her medications. She is ready to get back on track and she started all medications last week.  Her weight is 285 lb (129.3 kg) today and has gained 13 pounds since her last visit. She has lost 15 lbs since starting treatment with [name].  Diabetes II [name] has a diagnosis of diabetes type II. [name] states she is not checking BGs or taking medications in the last month. She has restarted medications last week and ready to get back to diet prescription. She denies any hypoglycemic episodes. Last A1c was 8.8. She has been working on intensive lifestyle modifications including diet, exercise, and weight loss to help control her blood glucose levels.  Hyperlipidemia Anyi has hyperlipidemia and has been trying to improve her cholesterol levels with intensive lifestyle modification including a low saturated fat diet, exercise and weight loss. [name] has been off her medications, likely uncontrolled and she is ready to start back on Crestor and diet. She denies any chest pain, claudication or myalgias.  Vitamin D Deficiency [name] has a diagnosis of vitamin D deficiency. She has been off her Vit D, level not yet at goal and she is ready to start back. She denies nausea, vomiting or muscle weakness.  ALLERGIES: Allergies  Allergen Reactions  . Sulfa Antibiotics   . Penicillins Rash    MEDICATIONS: Current Outpatient Medications on File Prior to Visit  Medication Sig Dispense Refill  . allopurinol (ZYLOPRIM) 100 MG tablet Take 1 tablet (100 mg total) by mouth  daily. 90 tablet 3  . Alpha-Lipoic Acid 100 MG CAPS Take by mouth.    Marland Kitchen amitriptyline (ELAVIL) 10 MG tablet at bedtime as needed.   5  . aspirin 81 MG tablet Take 81 mg by mouth daily. Reported on 12/24/2015    . buPROPion (WELLBUTRIN XL) 300 MG 24 hr tablet Take 1 tablet (300 mg total) by mouth daily. 90 tablet 1  . celecoxib (CELEBREX) 100 MG capsule Take 1 capsule (100 mg total) by mouth daily as needed. Yearly physical w/labs due in August must see MD for future refills 90 capsule 0  . Coenzyme Q10 (EQL COQ10) 300 MG CAPS Take 1 capsule (300 mg total) daily at 12 noon by mouth. 30 capsule 0  . colchicine 0.6 MG tablet TAKE 2 TABLETS BY MOUTH AT ONSET OF FLARE THEN TAKE 1 TABLET 1 HOUR LATER 15 tablet 0  . DULoxetine (CYMBALTA) 30 MG capsule Take 1 capsule (30 mg total) by mouth 2 (two) times daily. 180 capsule 1  . fluticasone (CUTIVATE) 0.05 % cream APPLY 3 TIMES A DAY AS NEEDED FOR ITCHING 60 g 1  . furosemide (LASIX) 80 MG tablet Take 1 tablet (80 mg total) by mouth daily. 90 tablet 3  . gabapentin (NEURONTIN) 300 MG capsule 300 mg 3 (three) times daily.     Marland Kitchen levocetirizine (XYZAL) 5 MG tablet TAKE 1 TABLET BY MOUTH EVERY EVENING 90 tablet 3  . levothyroxine (SYNTHROID, LEVOTHROID) 88 MCG tablet Take 88 mcg by mouth daily before breakfast.    .  losartan (COZAAR) 25 MG tablet Take 1 tablet (25 mg total) by mouth daily. -- Office visit needed for further refills 90 tablet 0  . Magnesium 100 MG CAPS Take 2 capsules by mouth daily.    Marland Kitchen NOVOLOG MIX 70/30 (70-30) 100 UNIT/ML injection Inject into the skin. 85 units breakfast , 55 units lunch and dinner    . NOVOLOG MIX 70/30 FLEXPEN (70-30) 100 UNIT/ML FlexPen Inject 85 units with breakfast, 60 units with lunch and 85 units with dinner. 225 mL 3  . ONETOUCH VERIO test strip   3  . rosuvastatin (CRESTOR) 5 MG tablet Take 1 tablet (5 mg total) by mouth daily. 90 tablet 3  . spironolactone (ALDACTONE) 25 MG tablet TAKE 1 TABLET (25 MG TOTAL) BY  MOUTH DAILY. 90 tablet 3  . traMADol (ULTRAM) 50 MG tablet Take by mouth every 12 (twelve) hours as needed.    . vitamin B-12 (CYANOCOBALAMIN) 500 MCG tablet Take 500 mcg by mouth 2 (two) times daily.    . Vitamin D, Ergocalciferol, (DRISDOL) 50000 units CAPS capsule Take 1 capsule (50,000 Units total) by mouth every 3 (three) days. 15 capsule 1   No current facility-administered medications on file prior to visit.     PAST MEDICAL HISTORY: Past Medical History:  Diagnosis Date  . Anxiety   . Arthritis   . Back pain   . Benign neoplasm of colon 06/29/2012  . Breast abscess 07/2011   left; s/p I&D + abx by gen surg  . Cervical radiculopathy at C6   . CKD (chronic kidney disease) stage 3, GFR 30-59 ml/min (HCC)    follows with renal  . Constipation   . Depression   . Diabetes mellitus type II   . Diastolic heart failure ([place]) 09/2013 echo  . Gall bladder inflammation   . Glaucoma   . Gout   . Hyperlipidemia   . Hypothyroidism   . Joint pain   . Kidney disease   . Leg pain   . Morbid obesity ([place])   . OBSTRUCTIVE SLEEP APNEA 12/23/2010   npsg 2012:  AHI 25/hr, severe desat to 68%.   autoset 2013: optimal pressure 13cm   . PERICARDIAL EFFUSION 12/12/2010  . Pulmonary hypertension ([place])   . Shortness of breath   . Skin lesion of breast 07/09/2012  . Swelling of extremity   . Unspecified hypothyroidism   . Vitamin B12 deficiency   . Vitamin D deficiency     PAST SURGICAL HISTORY: Past Surgical History:  Procedure Laterality Date  . BREAST BIOPSY  1990   Left  . CATARACT EXTRACTION    . COLONOSCOPY  06/29/2012   Procedure: COLONOSCOPY;  Surgeon: Ladene Artist, MD,FACG;  Location: [facility];  Service: Endoscopy;  Laterality: N/A;  . SHOULDER SURGERY  2010   Left  . TONSILLECTOMY  1964    SOCIAL HISTORY: Social History   Tobacco Use  . Smoking status: Never Smoker  . Smokeless tobacco: Never Used  Substance Use Topics  . Alcohol use: No    Alcohol/week: 0.0  standard drinks    Frequency: Never  . Drug use: No    FAMILY HISTORY: Family History  Problem Relation Age of Onset  . Hypertension Mother   . Heart disease Mother   . Heart attack Mother   . Thyroid disease Mother   . Diabetes Father   . Heart disease Father   . Skin cancer Father   . Cancer Father  skin  . Heart attack Father   . Hyperlipidemia Father   . Colon polyps Father   . Depression Father   . Hypertension Brother   . Skin cancer Paternal Grandfather   . Colon cancer Paternal Grandfather 49  . Breast cancer Paternal Aunt   . Cancer Paternal Aunt        breast  . Diabetes Paternal Uncle   . Diabetes Maternal Grandfather   . Sudden death Neg Hx     ROS: Review of Systems  Constitutional: Negative for weight loss.  Cardiovascular: Negative for chest pain and claudication.  Gastrointestinal: Negative for nausea and vomiting.  Musculoskeletal: Negative for myalgias.       Negative muscle weakness  Endo/Heme/Allergies:       Negative hypoglycemia    PHYSICAL EXAM: Blood pressure 124/76, pulse 74, temperature 98.3 F (36.8 C), temperature source Oral, height 5\' 3"  (1.6 m), weight 285 lb (129.3 kg), SpO2 93 %. Body mass index is 50.49 kg/m. Physical Exam  Constitutional: She is oriented to person, place, and time. She appears well-developed and well-nourished.  Cardiovascular: Normal rate.  Pulmonary/Chest: Effort normal.  Musculoskeletal: Normal range of motion.  Neurological: She is oriented to person, place, and time.  Skin: Skin is warm and dry.  Psychiatric: She has a normal mood and affect. Her behavior is normal.  Vitals reviewed.   RECENT LABS AND TESTS: BMET    Component Value Date/Time   NA 141 12/17/2017 1123   K 4.5 12/17/2017 1123   CL 101 12/17/2017 1123   CO2 22 12/17/2017 1123   GLUCOSE 184 (H) 12/17/2017 1123   GLUCOSE 130 (H) 01/08/2016 1649   BUN 38 (H) 12/17/2017 1123   CREATININE 2.37 (H) 12/17/2017 1123   CALCIUM 9.3  12/17/2017 1123   GFRNONAA 21 (L) 12/17/2017 1123   GFRAA 24 (L) 12/17/2017 1123   Lab Results  Component Value Date   HGBA1C 8.8 (H) 12/17/2017   HGBA1C 7.9 (H) 07/30/2017   HGBA1C 8.1 (H) 04/10/2015   HGBA1C 10.0 (H) 04/11/2014   HGBA1C 7.6 (A) 09/02/2013   Lab Results  Component Value Date   INSULIN 31.9 (H) 12/17/2017   INSULIN 37.2 (H) 07/30/2017   CBC    Component Value Date/Time   WBC 12.3 (H) 12/17/2017 1123   WBC 8.9 09/23/2013 1149   RBC 4.28 12/17/2017 1123   RBC 4.58 09/23/2013 1149   HGB 13.5 12/17/2017 1123   HCT 40.0 12/17/2017 1123   PLT 213 12/15/2016   MCV 94 12/17/2017 1123   MCH 31.5 12/17/2017 1123   MCH 28.1 01/29/2011 0500   MCHC 33.8 12/17/2017 1123   MCHC 33.0 09/23/2013 1149   RDW 14.3 12/17/2017 1123   LYMPHSABS 4.6 (H) 12/17/2017 1123   MONOABS 0.6 09/23/2013 1149   EOSABS 0.3 12/17/2017 1123   BASOSABS 0.0 12/17/2017 1123   Iron/TIBC/Ferritin/ %Sat No results found for: IRON, TIBC, FERRITIN, IRONPCTSAT Lipid Panel     Component Value Date/Time   CHOL 211 (H) 12/17/2017 1123   TRIG 241 (H) 12/17/2017 1123   HDL 33 (L) 12/17/2017 1123   CHOLHDL 4 04/10/2015 1212   VLDL 38.8 04/10/2015 1212   LDLCALC 130 (H) 12/17/2017 1123   LDLDIRECT 54.6 05/10/2012 1440   Hepatic Function Panel     Component Value Date/Time   PROT 7.4 12/17/2017 1123   ALBUMIN 3.7 12/17/2017 1123   AST 15 12/17/2017 1123   ALT 13 12/17/2017 1123   ALKPHOS 90 12/17/2017 1123  BILITOT 0.3 12/17/2017 1123   BILIDIR 0.1 05/10/2012 1440      Component Value Date/Time   TSH 2.610 07/30/2017 1020   TSH 1.62 04/10/2015 1212   TSH 1.65 09/23/2013 1149  Results for Holik, BRITTANIE DOSANJH (MRN [id number]) as of 05/17/2018 17:36  Ref. Range 12/17/2017 11:23  Vitamin D, 25-Hydroxy Latest Ref Range: 30.0 - 100.0 ng/mL 40.4    ASSESSMENT AND PLAN: Type 2 diabetes mellitus without complication, with long-term current use of insulin (HCC) - Plan: Comprehensive metabolic  panel, CBC With Differential, Hemoglobin A1c, Insulin, random  Other hyperlipidemia - Plan: Lipid Panel With LDL/HDL Ratio  Vitamin D deficiency - Plan: VITAMIN D 25 Hydroxy (Vit-D Deficiency, Fractures)  Class 3 severe obesity with serious comorbidity and body mass index (BMI) of 50.0 to 59.9 in adult, unspecified obesity type ([place])  PLAN:  Diabetes II Rebecka has been given extensive diabetes education by myself today including ideal fasting and post-prandial blood glucose readings, individual ideal Hgb A1c goals and hypoglycemia prevention. We discussed the importance of good blood sugar control to decrease the likelihood of diabetic complications such as nephropathy, neuropathy, limb loss, blindness, coronary artery disease, and death. We discussed the importance of intensive lifestyle modification including diet, exercise and weight loss as the first line treatment for diabetes. Rexanne agrees to continue her diabetes medications and will follow closely, she is to check BGs BID. We will check labs and Desteni agrees to follow up with our clinic in 2 to 3 weeks.  Hyperlipidemia Phyllistine was informed of the American Heart Association Guidelines emphasizing intensive lifestyle modifications as the first line treatment for hyperlipidemia. We discussed many lifestyle modifications today in depth, and Beatric will continue to work on decreasing saturated fats such as fatty red meat, butter and many fried foods. She will also increase vegetables and lean protein in her diet and continue to work on diet, exercise, and weight loss efforts. Kelena agrees to restart Crestor, we will check labs and she agrees to follow up with our clinic in 2 to 3 weeks.  Vitamin D Deficiency Serayah was informed that low vitamin D levels contributes to fatigue and are associated with obesity, breast, and colon cancer. Yariela agrees to restart prescription Vit D @50 ,000 IU every week and will follow up for routine testing of  vitamin D, at least 2-3 times per year. She was informed of the risk of over-replacement of vitamin D and agrees to not increase her dose unless she discusses this with [name] first. We will check labs and Paetyn agrees to follow up with our clinic in 2 to 3 weeks.  Obesity Marriana is currently in the action stage of change. As such, her goal is to continue with weight loss efforts She has agreed to get back to follow the Category 3 plan Stuti has been instructed to work up to a goal of 150 minutes of combined cardio and strengthening exercise per week for weight loss and overall health benefits. We discussed the following Behavioral Modification Strategies today: increasing lean protein intake, decreasing simple carbohydrates  and work on meal planning and easy cooking plans   Keiasia has agreed to follow up with our clinic in 2 to 3 weeks. She was informed of the importance of frequent follow up visits to maximize her success with intensive lifestyle modifications for her multiple health conditions.   OBESITY BEHAVIORAL INTERVENTION VISIT  Today's visit was # 12 out of 22.  Starting weight: 300 lbs Starting date: 07/29/17 Today's weight :  285 lbs  Today's date: 05/17/2018 Total lbs lost to date: 15    ASK: We discussed the diagnosis of obesity with Ed Blalock today and Clydie agreed to give [name] permission to discuss obesity behavioral modification therapy today.  ASSESS: Kerryn has the diagnosis of obesity and her BMI today is 50.5 Florice is in the action stage of change   ADVISE: Lenoir was educated on the multiple health risks of obesity as well as the benefit of weight loss to improve her health. She was advised of the need for long term treatment and the importance of lifestyle modifications.  AGREE: Multiple dietary modification options and treatment options were discussed and  Mallie agreed to the above obesity treatment plan.  I, Trixie Dredge, am acting as  transcriptionist for Dennard Nip, MD  I have reviewed the above documentation for accuracy and completeness, and I agree with the above. -Dennard Nip, MD

## 2018-05-18 LAB — LIPID PANEL WITH LDL/HDL RATIO
Cholesterol, Total: 190 mg/dL (ref 100–199)
HDL: 35 mg/dL — ABNORMAL LOW (ref >39)
LDL Calculated: 115 mg/dL — ABNORMAL HIGH (ref 0–99)
LDl/HDL Ratio: 3.3 ratio — ABNORMAL HIGH (ref 0.0–3.2)
Triglycerides: 199 mg/dL — ABNORMAL HIGH (ref 0–149)
VLDL CHOLESTEROL CAL: 40 mg/dL (ref 5–40)

## 2018-05-18 LAB — COMPREHENSIVE METABOLIC PANEL
A/G RATIO: 1.2 (ref 1.2–2.2)
ALBUMIN: 3.8 g/dL (ref 3.6–4.8)
ALK PHOS: 76 IU/L (ref 39–117)
ALT: 9 IU/L (ref 0–32)
AST: 11 IU/L (ref 0–40)
BILIRUBIN TOTAL: 0.4 mg/dL (ref 0.0–1.2)
BUN / CREAT RATIO: 17 (ref 12–28)
BUN: 32 mg/dL — ABNORMAL HIGH (ref 8–27)
CHLORIDE: 100 mmol/L (ref 96–106)
CO2: 21 mmol/L (ref 20–29)
Calcium: 8.9 mg/dL (ref 8.7–10.3)
Creatinine, Ser: 1.89 mg/dL — ABNORMAL HIGH (ref 0.57–1.00)
GFR calc Af Amer: 31 mL/min/{1.73_m2} — ABNORMAL LOW (ref >59)
GFR calc non Af Amer: 27 mL/min/{1.73_m2} — ABNORMAL LOW (ref >59)
GLOBULIN, TOTAL: 3.1 g/dL (ref 1.5–4.5)
Glucose: 217 mg/dL — ABNORMAL HIGH (ref 65–99)
Potassium: 4.6 mmol/L (ref 3.5–5.2)
SODIUM: 140 mmol/L (ref 134–144)
Total Protein: 6.9 g/dL (ref 6.0–8.5)

## 2018-05-18 LAB — CBC WITH DIFFERENTIAL
BASOS: 0 %
Basophils Absolute: 0 10*3/uL (ref 0.0–0.2)
EOS (ABSOLUTE): 0.2 10*3/uL (ref 0.0–0.4)
Eos: 2 %
Hematocrit: 41.1 % (ref 34.0–46.6)
Hemoglobin: 13 g/dL (ref 11.1–15.9)
IMMATURE GRANULOCYTES: 0 %
Immature Grans (Abs): 0 10*3/uL (ref 0.0–0.1)
LYMPHS ABS: 4.1 10*3/uL — AB (ref 0.7–3.1)
Lymphs: 37 %
MCH: 30.4 pg (ref 26.6–33.0)
MCHC: 31.6 g/dL (ref 31.5–35.7)
MCV: 96 fL (ref 79–97)
MONOS ABS: 0.7 10*3/uL (ref 0.1–0.9)
Monocytes: 6 %
NEUTROS PCT: 55 %
Neutrophils Absolute: 5.9 10*3/uL (ref 1.4–7.0)
RBC: 4.27 x10E6/uL (ref 3.77–5.28)
RDW: 14.8 % (ref 12.3–15.4)
WBC: 11 10*3/uL — AB (ref 3.4–10.8)

## 2018-05-18 LAB — HEMOGLOBIN A1C
Est. average glucose Bld gHb Est-mCnc: 237 mg/dL
Hgb A1c MFr Bld: 9.9 % — ABNORMAL HIGH (ref 4.8–5.6)

## 2018-05-18 LAB — INSULIN, RANDOM: INSULIN: 9.8 u[IU]/mL (ref 2.6–24.9)

## 2018-05-18 LAB — VITAMIN D 25 HYDROXY (VIT D DEFICIENCY, FRACTURES): VIT D 25 HYDROXY: 23.6 ng/mL — AB (ref 30.0–100.0)

## 2018-05-19 ENCOUNTER — Ambulatory Visit: Payer: Self-pay | Admitting: *Deleted

## 2018-05-20 ENCOUNTER — Other Ambulatory Visit: Payer: Self-pay

## 2018-05-20 DIAGNOSIS — E1122 Type 2 diabetes mellitus with diabetic chronic kidney disease: Secondary | ICD-10-CM

## 2018-05-20 DIAGNOSIS — E1165 Type 2 diabetes mellitus with hyperglycemia: Principal | ICD-10-CM

## 2018-05-20 DIAGNOSIS — N183 Chronic kidney disease, stage 3 (moderate): Principal | ICD-10-CM

## 2018-05-20 DIAGNOSIS — IMO0002 Reserved for concepts with insufficient information to code with codable children: Secondary | ICD-10-CM

## 2018-05-20 NOTE — Patient Outreach (Signed)
[facility]) Care Management  05/20/2018   Alice Kemp 1951/09/17 [id number]   Telephone call for follow up visit for diabetes chronic care improvement program  Subjective: States that her depression is slowly improving. States she saw Dr. Quay Burow and she did not change any of her medications as is she started back taking her medications now.  States she saw Dr. Leafy Ro this week and she has started watching what she is eating now.  States she has not been checking her blood sugars as she misplaced her glucometer but she has not tried to find it.  States she has not been doing any exercise but she did go to [employer] to shop and she walked there.  States she is going up to her Mother's about 3 times a week and that is the only time she drinks sweet tea  Objective: Member has hx of Type 2 DM, Stage 3 chronic kidney disease, HTN, hyperlipidemia, gout, depression and chronic back pain per review of medical record. Hemoglobin A1C8.1%5/13/19perKnowledge Performance Now([facility])  Current Medications:  Current Outpatient Medications  Medication Sig Dispense Refill  . allopurinol (ZYLOPRIM) 100 MG tablet Take 1 tablet (100 mg total) by mouth daily. 90 tablet 3  . Alpha-Lipoic Acid 100 MG CAPS Take by mouth.    Marland Kitchen amitriptyline (ELAVIL) 10 MG tablet at bedtime as needed.   5  . aspirin 81 MG tablet Take 81 mg by mouth daily. Reported on 12/24/2015    . buPROPion (WELLBUTRIN XL) 300 MG 24 hr tablet Take 1 tablet (300 mg total) by mouth daily. 90 tablet 1  . celecoxib (CELEBREX) 100 MG capsule Take 1 capsule (100 mg total) by mouth daily as needed. Yearly physical w/labs due in August must see MD for future refills 90 capsule 0  . Coenzyme Q10 (EQL COQ10) 300 MG CAPS Take 1 capsule (300 mg total) daily at 12 noon by mouth. 30 capsule 0  . colchicine 0.6 MG tablet TAKE 2 TABLETS BY MOUTH AT ONSET OF FLARE THEN TAKE 1 TABLET 1 HOUR LATER 15 tablet 0  . DULoxetine (CYMBALTA) 30 MG  capsule Take 1 capsule (30 mg total) by mouth 2 (two) times daily. 180 capsule 1  . fluticasone (CUTIVATE) 0.05 % cream APPLY 3 TIMES A DAY AS NEEDED FOR ITCHING 60 g 1  . furosemide (LASIX) 80 MG tablet Take 1 tablet (80 mg total) by mouth daily. 90 tablet 3  . gabapentin (NEURONTIN) 300 MG capsule 300 mg 3 (three) times daily.     Marland Kitchen levocetirizine (XYZAL) 5 MG tablet TAKE 1 TABLET BY MOUTH EVERY EVENING 90 tablet 3  . levothyroxine (SYNTHROID, LEVOTHROID) 88 MCG tablet Take 88 mcg by mouth daily before breakfast.    . losartan (COZAAR) 25 MG tablet Take 1 tablet (25 mg total) by mouth daily. -- Office visit needed for further refills 90 tablet 0  . Magnesium 100 MG CAPS Take 2 capsules by mouth daily.    Marland Kitchen NOVOLOG MIX 70/30 (70-30) 100 UNIT/ML injection Inject into the skin. 85 units breakfast , 55 units lunch and dinner    . ONETOUCH VERIO test strip   3  . rosuvastatin (CRESTOR) 5 MG tablet Take 1 tablet (5 mg total) by mouth daily. 90 tablet 3  . spironolactone (ALDACTONE) 25 MG tablet TAKE 1 TABLET (25 MG TOTAL) BY MOUTH DAILY. 90 tablet 3  . traMADol (ULTRAM) 50 MG tablet Take by mouth every 12 (twelve) hours as needed.    . vitamin  B-12 (CYANOCOBALAMIN) 500 MCG tablet Take 500 mcg by mouth 2 (two) times daily.    . Vitamin D, Ergocalciferol, (DRISDOL) 50000 units CAPS capsule Take 1 capsule (50,000 Units total) by mouth every 3 (three) days. 15 capsule 1  . NOVOLOG MIX 70/30 FLEXPEN (70-30) 100 UNIT/ML FlexPen Inject 85 units with breakfast, 60 units with lunch and 85 units with dinner. (Patient not taking: Reported on 05/20/2018) 225 mL 3   No current facility-administered medications for this visit.     Functional Status:  In your present state of health, do you have any difficulty performing the following activities: 02/03/2018  Hearing? N  Vision? Y  Difficulty concentrating or making decisions? N  Walking or climbing stairs? Y  Dressing or bathing? N  Doing errands, shopping? N   Preparing Food and eating ? N  Using the Toilet? N  In the past six months, have you accidently leaked urine? Y  Do you have problems with loss of bowel control? N  Managing your Medications? N  Managing your Finances? N  Housekeeping or managing your Housekeeping? Y  Some recent data might be hidden    Fall/Depression Screening: Fall Risk  04/28/2018 02/03/2018 01/08/2016  Falls in the past year? No Yes Yes  Number falls in past yr: - 2 or more 2 or more  Injury with Fall? - Yes Yes  Comment - fell off porch scraped knees -  Risk Factor Category  - High Fall Risk -  Risk for fall due to : - History of fall(s);Impaired balance/gait -  Follow up - Education provided -   [facility] 2/9 Scores 05/06/2018 04/28/2018 02/03/2018 07/29/2017 01/27/2017 01/08/2016  PHQ - 2 Score '4 6 4 6 5 6  '$ PHQ- 9 Score '12 13 15 22 20 12    '$ Assessment: Follow uptelephonefordisease management of Type 2 DM. Member continues tonot meeting diabetes self management goal of hemoglobin A1C less than 7% with last reading 8.1%. Member has been non-adherent with diet and CBG checks since her Father expired 02/20/18.  Member has started to take her medications and reports that she saw Dr.Burns on 05/11/18 and Dr.Beasley on 05/17/18  Reports improved depression and is being followed by Education officer, museum. Reports improvements in diet adherence.  Member has not been checking blood sugars and has misplaced her meter but has not attempted to look for her meter.  Member up to date with annual dilated eye exam and foot exam. Member had flu vaccination and pneumonia vaccine in September 2018.   Plan:  [facility] CM Care Plan Problem One     Most Recent Value  Care Plan Problem One  Knowledge deficit related to diabetes self mangement as evidenced by elevated hemoglobin A1C of 8.8%  Role Documenting the Problem One  Care Management [place] for Problem One  Active  [facility] Long Term Goal   Member will decrease hemoglobinA1C by 1% in the  next 90 days   [facility] Long Term Goal Start Date  04/29/18  [facility] Long Term Goal Met Date  -- [Continue goal hemoglobin A1C has not been checked]  Interventions for Problem One Long Term Goal  Instructed on importance of checking her CBGs regularly, Encouraged to try to find her glucometer, Instructed to contact her provider to order a new meter if she can not find her meter, Reinforced instructions on CHO diet and portion control,  Encouraged to increase her activity and to try to walk, Discussed importance of regular dental checkups and cleanings  [facility] CM Short Term Goal #1   Member will check CBG once a week for 4 weeks and keep log as evidenced by report from member  [facility] CM Short Term Goal #1 Start Date  05/20/18  Interventions for Short Term Goal #1  Encouraged to find meter and to call provider if she can on find her meter  [facility] CM Short Term Goal #2   Member will call Health team concierge to get Silver Sneakers information in the next 2 weeks as reported by member  [facility] CM Short Term Goal #2 Start Date  02/03/18  [facility] CM Short Term Goal #2 Met Date  03/02/18  [facility] CM Short Term Goal #3  Member will limit sweet tea to three times a week per report of member for the next 30 days  [facility] CM Short Term Goal #3 Start Date  04/29/18  [facility] CM Short Term Goal #3 Met Date  05/20/18    Plan for telephone visit in one month.  Peter Garter RN, Jackquline [place], CDE Care Management Coordinator [facility] Care Management [phone]

## 2018-05-27 ENCOUNTER — Other Ambulatory Visit: Payer: Self-pay | Admitting: *Deleted

## 2018-05-27 NOTE — Patient Outreach (Signed)
[facility]) Care Management  [facility] Social Work  05/27/2018  RHEMA BOYETT December 26, 1950 [id number]  Subjective:  "feeling better"  Objective: [facility] CSW to assist patient and family with community based resources to aide in their well-being, quality of life and overall safety and needs.    Encounter Medications:  Outpatient Encounter Medications as of 05/27/2018  Medication Sig Note  . allopurinol (ZYLOPRIM) 100 MG tablet Take 1 tablet (100 mg total) by mouth daily.   . Alpha-Lipoic Acid 100 MG CAPS Take by mouth.   Marland Kitchen amitriptyline (ELAVIL) 10 MG tablet at bedtime as needed.  04/10/2015: Received from: External Pharmacy  . aspirin 81 MG tablet Take 81 mg by mouth daily. Reported on 12/24/2015   . buPROPion (WELLBUTRIN XL) 300 MG 24 hr tablet Take 1 tablet (300 mg total) by mouth daily.   . celecoxib (CELEBREX) 100 MG capsule Take 1 capsule (100 mg total) by mouth daily as needed. Yearly physical w/labs due in August must see MD for future refills   . Coenzyme Q10 (EQL COQ10) 300 MG CAPS Take 1 capsule (300 mg total) daily at 12 noon by mouth.   . colchicine 0.6 MG tablet TAKE 2 TABLETS BY MOUTH AT ONSET OF FLARE THEN TAKE 1 TABLET 1 HOUR LATER   . DULoxetine (CYMBALTA) 30 MG capsule Take 1 capsule (30 mg total) by mouth 2 (two) times daily.   . fluticasone (CUTIVATE) 0.05 % cream APPLY 3 TIMES A DAY AS NEEDED FOR ITCHING   . furosemide (LASIX) 80 MG tablet Take 1 tablet (80 mg total) by mouth daily.   Marland Kitchen gabapentin (NEURONTIN) 300 MG capsule 300 mg 3 (three) times daily.  06/02/2016: Received from: External Pharmacy  . levocetirizine (XYZAL) 5 MG tablet TAKE 1 TABLET BY MOUTH EVERY EVENING   . levothyroxine (SYNTHROID, LEVOTHROID) 88 MCG tablet Take 88 mcg by mouth daily before breakfast.   . losartan (COZAAR) 25 MG tablet Take 1 tablet (25 mg total) by mouth daily. -- Office visit needed for further refills   . Magnesium 100 MG CAPS Take 2 capsules by mouth daily.   Marland Kitchen NOVOLOG  MIX 70/30 (70-30) 100 UNIT/ML injection Inject into the skin. 85 units breakfast , 55 units lunch and dinner   . NOVOLOG MIX 70/30 FLEXPEN (70-30) 100 UNIT/ML FlexPen Inject 85 units with breakfast, 60 units with lunch and 85 units with dinner. (Patient not taking: Reported on 05/20/2018)   . ONETOUCH VERIO test strip  06/06/2015: Received from: External Pharmacy  . rosuvastatin (CRESTOR) 5 MG tablet Take 1 tablet (5 mg total) by mouth daily.   Marland Kitchen spironolactone (ALDACTONE) 25 MG tablet TAKE 1 TABLET (25 MG TOTAL) BY MOUTH DAILY.   . traMADol (ULTRAM) 50 MG tablet Take by mouth every 12 (twelve) hours as needed.   . vitamin B-12 (CYANOCOBALAMIN) 500 MCG tablet Take 500 mcg by mouth 2 (two) times daily. 03/10/2018: Remembers about 3x week  . Vitamin D, Ergocalciferol, (DRISDOL) 50000 units CAPS capsule Take 1 capsule (50,000 Units total) by mouth every 3 (three) days.    No facility-administered encounter medications on file as of 05/27/2018.     Functional Status:  In your present state of health, do you have any difficulty performing the following activities: 02/03/2018  Hearing? N  Vision? Y  Difficulty concentrating or making decisions? N  Walking or climbing stairs? Y  Dressing or bathing? N  Doing errands, shopping? N  Preparing Food and eating ? N  Using the  Toilet? N  In the past six months, have you accidently leaked urine? Y  Do you have problems with loss of bowel control? N  Managing your Medications? N  Managing your Finances? N  Housekeeping or managing your Housekeeping? Y  Some recent data might be hidden    Fall/Depression Screening:  PHQ 2/9 Scores 05/27/2018 05/06/2018 04/28/2018 02/03/2018 07/29/2017 01/27/2017 01/08/2016  PHQ - 2 Score '2 4 6 4 6 5 6  '$ PHQ- 9 Score '4 12 13 15 22 20 12    '$ Assessment:  TH[facility] CM Care Plan Problem One     Most Recent Value  Care Plan Problem One  Patient with signifcant signs/symptoms of depression.  Role Documenting the Problem One  Clinical  Social Worker  Care Plan for Problem One  Active  [facility] Long Term Goal   Patient will have less signs/symptoms and score less than 5 on PHQ9 in the next 60 days.   [facility]N Long Term Goal Start Date  04/28/18  [facility]N Long Term Goal Met Date  05/27/18  Interventions for Problem One Long Term Goal  CSW assessed pt by phone- she reports "4" out of 10 on depression scale.   [facility]N CM Short Term Goal #1   Patient will report receipt of community resources mailed for mental health support. in the next 14 days.   THN CM Short Term Goal #1 Start Date  04/28/18  [facility] CM Short Term Goal #1 Met Date  05/27/18  Interventions for Short Term Goal #1  Pt aware of support/resources to consider for mental health support and job seach  [facility]N CM Short Term Goal #2   Pt will report taking her RX's as prescribed over the next 7 days.   THN CM Short Term Goal #2 Start Date  04/28/18  [facility] CM Short Term Goal #2 Met Date  05/27/18  Interventions for Short Term Goal #2  Pt reports taking RX's and feeling "better". CSW encouraged pt to consider counseling, support group, etc for grief/loss.   THN CM Short Term Goal #3  Pt will seek employment and review info mailed to aide in finding work in the next 30 days.   THN CM Short Term Goal #3 Start Date  04/28/18  Interventions for Short Tern Goal #3  Pt reports she has not doine naything more with job search.      Plan:  Pt encouraged to consider therapy and/or support group for grief and loss. She continues to be uncertain about wanting to do this; stating, "we have to go through it in our own way and time".  CSW confirmed pt has resources and support. She is open to CSW f/u in 2-3 weeks.   [name], MSW, [place] Worker  [place] [phone]

## 2018-06-08 ENCOUNTER — Ambulatory Visit (INDEPENDENT_AMBULATORY_CARE_PROVIDER_SITE_OTHER): Payer: PPO | Admitting: Family Medicine

## 2018-06-08 VITALS — BP 102/64 | HR 80 | Temp 98.0°F | Ht 63.0 in | Wt 270.0 lb

## 2018-06-08 DIAGNOSIS — E1121 Type 2 diabetes mellitus with diabetic nephropathy: Secondary | ICD-10-CM

## 2018-06-08 DIAGNOSIS — Z794 Long term (current) use of insulin: Secondary | ICD-10-CM | POA: Diagnosis not present

## 2018-06-08 DIAGNOSIS — Z6841 Body Mass Index (BMI) 40.0 and over, adult: Secondary | ICD-10-CM | POA: Diagnosis not present

## 2018-06-08 DIAGNOSIS — E559 Vitamin D deficiency, unspecified: Secondary | ICD-10-CM

## 2018-06-09 NOTE — Progress Notes (Signed)
Office: [phone]  /  Fax: [phone]   HPI:   Chief Complaint: OBESITY Alice Kemp is here to discuss her progress with her obesity treatment plan. She is on the Category 3 plan and is following her eating plan approximately 60 % of the time. She states she is exercising 0 minutes 0 times per week. Alice Kemp has gotten back on track after a two month period of depression and she feels better.Alice Kemp is bored with breakfast and she would like more options. Her weight is 270 lb (122.5 kg) today and has had a weight loss of 15 pounds over a period of 3 weeks since her last visit. She has lost 30 lbs since starting treatment with [name].  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 She is back on her medications and diet; and now her fasting glucose and 2 hour post prandial glucose averages are between 120 and 160's, which is greatly improved. Alice Kemp denies any hypoglycemic episodes. She has been working on intensive lifestyle modifications including diet, exercise, and weight loss to help control her blood glucose levels.  Vitamin D deficiency Alice Kemp has a diagnosis of vitamin D deficiency. Alice Kemp was off of her vitamin d, but now she is back on a high dose of vitamin D. She notes feeling more energy over the last two weeks. Alice Kemp denies nausea, vomiting or muscle weakness.  ALLERGIES: Allergies  Allergen Reactions  . Sulfa Antibiotics   . Penicillins Rash    MEDICATIONS: Current Outpatient Medications on File Prior to Visit  Medication Sig Dispense Refill  . allopurinol (ZYLOPRIM) 100 MG tablet Take 1 tablet (100 mg total) by mouth daily. 90 tablet 3  . Alpha-Lipoic Acid 100 MG CAPS Take by mouth.    Marland Kitchen amitriptyline (ELAVIL) 10 MG tablet at bedtime as needed.   5  . aspirin 81 MG tablet Take 81 mg by mouth daily. Reported on 12/24/2015    . buPROPion (WELLBUTRIN XL) 300 MG 24  hr tablet Take 1 tablet (300 mg total) by mouth daily. 90 tablet 1  . celecoxib (CELEBREX) 100 MG capsule Take 1 capsule (100 mg total) by mouth daily as needed. Yearly physical w/labs due in August must see MD for future refills 90 capsule 0  . Coenzyme Q10 (EQL COQ10) 300 MG CAPS Take 1 capsule (300 mg total) daily at 12 noon by mouth. 30 capsule 0  . colchicine 0.6 MG tablet TAKE 2 TABLETS BY MOUTH AT ONSET OF FLARE THEN TAKE 1 TABLET 1 HOUR LATER 15 tablet 0  . DULoxetine (CYMBALTA) 30 MG capsule Take 1 capsule (30 mg total) by mouth 2 (two) times daily. 180 capsule 1  . fluticasone (CUTIVATE) 0.05 % cream APPLY 3 TIMES A DAY AS NEEDED FOR ITCHING 60 g 1  . furosemide (LASIX) 80 MG tablet Take 1 tablet (80 mg total) by mouth daily. 90 tablet 3  . gabapentin (NEURONTIN) 300 MG capsule 300 mg 3 (three) times daily.     Marland Kitchen levocetirizine (XYZAL) 5 MG tablet TAKE 1 TABLET BY MOUTH EVERY EVENING 90 tablet 3  . levothyroxine (SYNTHROID, LEVOTHROID) 88 MCG tablet Take 88 mcg by mouth daily before breakfast.    . losartan (COZAAR) 25 MG tablet Take 1 tablet (25 mg total) by mouth daily. -- Office visit needed for further refills 90 tablet 0  . Magnesium 100 MG CAPS Take 2  capsules by mouth daily.    Marland Kitchen NOVOLOG MIX 70/30 (70-30) 100 UNIT/ML injection Inject into the skin. 85 units breakfast , 55 units lunch and dinner    . NOVOLOG MIX 70/30 FLEXPEN (70-30) 100 UNIT/ML FlexPen Inject 85 units with breakfast, 60 units with lunch and 85 units with dinner. 225 mL 3  . ONETOUCH VERIO test strip   3  . rosuvastatin (CRESTOR) 5 MG tablet Take 1 tablet (5 mg total) by mouth daily. 90 tablet 3  . spironolactone (ALDACTONE) 25 MG tablet TAKE 1 TABLET (25 MG TOTAL) BY MOUTH DAILY. 90 tablet 3  . traMADol (ULTRAM) 50 MG tablet Take by mouth every 12 (twelve) hours as needed.    . vitamin B-12 (CYANOCOBALAMIN) 500 MCG tablet Take 500 mcg by mouth 2 (two) times daily.    . Vitamin D, Ergocalciferol, (DRISDOL) 50000  units CAPS capsule Take 1 capsule (50,000 Units total) by mouth every 3 (three) days. 15 capsule 1   No current facility-administered medications on file prior to visit.     PAST MEDICAL HISTORY: Past Medical History:  Diagnosis Date  . Anxiety   . Arthritis   . Back pain   . Benign neoplasm of colon 06/29/2012  . Breast abscess 07/2011   left; s/p I&D + abx by gen surg  . Cervical radiculopathy at C6   . CKD (chronic kidney disease) stage 3, GFR 30-59 ml/min (HCC)    follows with renal  . Constipation   . Depression   . Diabetes mellitus type II   . Diastolic heart failure ([place]) 09/2013 echo  . Gall bladder inflammation   . Glaucoma   . Gout   . Hyperlipidemia   . Hypothyroidism   . Joint pain   . Kidney disease   . Leg pain   . Morbid obesity ([place])   . OBSTRUCTIVE SLEEP APNEA 12/23/2010   npsg 2012:  AHI 25/hr, severe desat to 68%.   autoset 2013: optimal pressure 13cm   . PERICARDIAL EFFUSION 12/12/2010  . Pulmonary hypertension ([place])   . Shortness of breath   . Skin lesion of breast 07/09/2012  . Swelling of extremity   . Unspecified hypothyroidism   . Vitamin B12 deficiency   . Vitamin D deficiency     PAST SURGICAL HISTORY: Past Surgical History:  Procedure Laterality Date  . BREAST BIOPSY  1990   Left  . CATARACT EXTRACTION    . COLONOSCOPY  06/29/2012   Procedure: COLONOSCOPY;  Surgeon: Ladene Artist, MD,FACG;  Location: [facility];  Service: Endoscopy;  Laterality: N/A;  . SHOULDER SURGERY  2010   Left  . TONSILLECTOMY  1964    SOCIAL HISTORY: Social History   Tobacco Use  . Smoking status: Never Smoker  . Smokeless tobacco: Never Used  Substance Use Topics  . Alcohol use: No    Alcohol/week: 0.0 standard drinks    Frequency: Never  . Drug use: No    FAMILY HISTORY: Family History  Problem Relation Age of Onset  . Hypertension Mother   . Heart disease Mother   . Heart attack Mother   . Thyroid disease Mother   . Diabetes Father   .  Heart disease Father   . Skin cancer Father   . Cancer Father        skin  . Heart attack Father   . Hyperlipidemia Father   . Colon polyps Father   . Depression Father   . Hypertension Brother   .  Skin cancer Paternal Grandfather   . Colon cancer Paternal Grandfather 37  . Breast cancer Paternal Aunt   . Cancer Paternal Aunt        breast  . Diabetes Paternal Uncle   . Diabetes Maternal Grandfather   . Sudden death Neg Hx     ROS: Review of Systems  Constitutional: Positive for weight loss.  Gastrointestinal: Negative for nausea and vomiting.  Musculoskeletal:       Negative for muscle weakness  Endo/Heme/Allergies:       Negative for hypoglycemia    PHYSICAL EXAM: Blood pressure 102/64, pulse 80, temperature 98 F (36.7 C), temperature source Oral, height 5\' 3"  (1.6 m), weight 270 lb (122.5 kg), SpO2 97 %. Body mass index is 47.83 kg/m. Physical Exam  Constitutional: She is oriented to person, place, and time. She appears well-developed and well-nourished.  Cardiovascular: Normal rate.  Pulmonary/Chest: Effort normal.  Musculoskeletal: Normal range of motion.  Neurological: She is oriented to person, place, and time.  Skin: Skin is warm and dry.  Psychiatric: She has a normal mood and affect. Her behavior is normal.  Vitals reviewed.   RECENT LABS AND TESTS: BMET    Component Value Date/Time   NA 140 05/17/2018 1128   K 4.6 05/17/2018 1128   CL 100 05/17/2018 1128   CO2 21 05/17/2018 1128   GLUCOSE 217 (H) 05/17/2018 1128   GLUCOSE 130 (H) 01/08/2016 1649   BUN 32 (H) 05/17/2018 1128   CREATININE 1.89 (H) 05/17/2018 1128   CALCIUM 8.9 05/17/2018 1128   GFRNONAA 27 (L) 05/17/2018 1128   GFRAA 31 (L) 05/17/2018 1128   Lab Results  Component Value Date   HGBA1C 9.9 (H) 05/17/2018   HGBA1C 8.8 (H) 12/17/2017   HGBA1C 7.9 (H) 07/30/2017   HGBA1C 8.1 (H) 04/10/2015   HGBA1C 10.0 (H) 04/11/2014   Lab Results  Component Value Date   INSULIN 9.8  05/17/2018   INSULIN 31.9 (H) 12/17/2017   INSULIN 37.2 (H) 07/30/2017   CBC    Component Value Date/Time   WBC 11.0 (H) 05/17/2018 1128   WBC 8.9 09/23/2013 1149   RBC 4.27 05/17/2018 1128   RBC 4.58 09/23/2013 1149   HGB 13.0 05/17/2018 1128   HCT 41.1 05/17/2018 1128   PLT 213 12/15/2016   MCV 96 05/17/2018 1128   MCH 30.4 05/17/2018 1128   MCH 28.1 01/29/2011 0500   MCHC 31.6 05/17/2018 1128   MCHC 33.0 09/23/2013 1149   RDW 14.8 05/17/2018 1128   LYMPHSABS 4.1 (H) 05/17/2018 1128   MONOABS 0.6 09/23/2013 1149   EOSABS 0.2 05/17/2018 1128   BASOSABS 0.0 05/17/2018 1128   Iron/TIBC/Ferritin/ %Sat No results found for: IRON, TIBC, FERRITIN, IRONPCTSAT Lipid Panel     Component Value Date/Time   CHOL 190 05/17/2018 1128   TRIG 199 (H) 05/17/2018 1128   HDL 35 (L) 05/17/2018 1128   CHOLHDL 4 04/10/2015 1212   VLDL 38.8 04/10/2015 1212   LDLCALC 115 (H) 05/17/2018 1128   LDLDIRECT 54.6 05/10/2012 1440   Hepatic Function Panel     Component Value Date/Time   PROT 6.9 05/17/2018 1128   ALBUMIN 3.8 05/17/2018 1128   AST 11 05/17/2018 1128   ALT 9 05/17/2018 1128   ALKPHOS 76 05/17/2018 1128   BILITOT 0.4 05/17/2018 1128   BILIDIR 0.1 05/10/2012 1440      Component Value Date/Time   TSH 2.610 07/30/2017 1020   TSH 1.62 04/10/2015 1212   TSH 1.65  09/23/2013 1149   Results for Fojtik, MELEENA MUNROE (MRN [id number]) as of 06/09/2018 12:14  Ref. 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 We discussed the importance of good blood sugar control to decrease the likelihood of diabetic complications such as nephropathy, neuropathy, limb loss, blindness, coronary artery disease, and death. We discussed the importance of intensive lifestyle modification including diet, exercise and weight loss as the first line treatment for diabetes. Alizabeth agrees to continue diet and exercise, and check her blood sugar. We will continue to monitor and Alice Kemp will follow up at the agreed upon time.  Vitamin D Deficiency Alice Kemp was informed that low vitamin D levels contributes to fatigue and are associated with obesity, breast, and colon cancer. She agrees to continue to take prescription Vit D @50 ,000 IU every 3 days and will follow up for routine testing of vitamin D, at least 2-3 times per year. She was informed of the risk of over-replacement of vitamin D and agrees to not increase her dose unless she discusses this with [name] first. We will recheck labs in 3 months and Alice Kemp will follow up as directed.  I spent > than 50% of the 25 minute visit on counseling as documented in the note.  Obesity Alice Kemp is currently in the action stage of change. As such, her goal is to continue with weight loss efforts She has agreed to keep a food journal with 250 to 400 calories and 20 grams of protein at breakfast daily and follow the Category 3 plan Alice Kemp has been instructed to work up to a goal of 150 minutes of combined cardio and strengthening exercise per week for weight loss and overall health benefits. We discussed the following Behavioral Modification Strategies today: keep a strict food journal, increasing lean protein intake and work on meal planning and easy cooking plans  Alice Kemp has agreed to follow up with our clinic in 2 to 3 weeks. She was informed of the importance of frequent follow up visits to maximize her success with  intensive lifestyle modifications for her multiple health conditions.   OBESITY BEHAVIORAL INTERVENTION VISIT  Today's visit was # 13   Starting weight: 300 lbs Starting date: 07/29/17 Today's weight : 270 lbs Today's date: 06/08/2018 Total lbs lost to date: 30   ASK: We discussed the diagnosis of obesity with Alice Kemp today and Alice Kemp agreed to give [name] permission to discuss obesity behavioral modification therapy today.  ASSESS: Alice Kemp has the diagnosis of obesity and her BMI today is 47.84 Alice Kemp is in the action stage of change   ADVISE: Alice Kemp was educated on the multiple health risks of obesity as well as the benefit of weight loss to improve her health. She was advised of the need for long term treatment and the importance of lifestyle modifications to improve her current health and to decrease her risk of future health problems.  AGREE: Multiple dietary modification options and treatment options were discussed and  Alice Kemp agreed  to follow the recommendations documented in the above note.  ARRANGE: Alice Kemp was educated on the importance of frequent visits to treat obesity as outlined per CMS and USPSTF guidelines and agreed to schedule her next follow up appointment today.  I, Doreene Nest, am acting as transcriptionist for Dennard Nip, MD  I have reviewed the above documentation for accuracy and completeness, and I agree with the above. -Dennard Nip, MD

## 2018-06-17 ENCOUNTER — Other Ambulatory Visit: Payer: Self-pay

## 2018-06-17 NOTE — Patient Outreach (Signed)
[place] [facility]) Care Management  06/17/2018  TOBIN CADIENTE 12-04-1950 [id number]   Telephone call for scheduled follow up visit for diabetes chronic care improvement program No answer and HIPAA compliant message left. Plan to call in 3-4 business days if call not returned. Peter Garter RN, Jackquline Denmark, CDE Care Management Coordinator [facility] Care Management [phone]

## 2018-06-21 ENCOUNTER — Other Ambulatory Visit: Payer: Self-pay | Admitting: *Deleted

## 2018-06-21 ENCOUNTER — Other Ambulatory Visit: Payer: Self-pay

## 2018-06-21 NOTE — Patient Outreach (Signed)
[name]) Care Management  06/21/2018  Alice Kemp 1951/07/12 [id number]   CSW attempted to reach pt again today and left a voicemail message for return call. CSW will attempt outreach again this week if no return call received.[name], MSW, [place] Worker  [place] [phone]

## 2018-06-21 NOTE — Patient Outreach (Signed)
[facility]) Care Management  06/21/2018   Alice Kemp Jun 20, 1951 [id number]   Telephone call for follow up visit for diabetes chronic care improvement program  Subjective:  States that she is doing better and her depression is much improved.  States that she has seen Dr.Beasley twice and she has lost 15 lb since going back.  States she is now checking her blood sugars twice a day.  States that her morning readings are ranging 150-188 and post supper 200-204.  States she did have one low reading of 69 before supper that she did feel.  States she is trying to stay on her eating plan most of the time.  States she still drinks sweet tea 2-3 times a week when she is at her Mother's house.States she is not exercising other than cleaning around her home.  Objective:  Member has hx of Type 2 DM, Stage 3 chronic kidney disease, HTN, hyperlipidemia, gout, depression and chronic back pain per review of medical record. Hemoglobin A1C9.9% on 05/17/18  Current Medications:  Current Outpatient Medications  Medication Sig Dispense Refill  . allopurinol (ZYLOPRIM) 100 MG tablet Take 1 tablet (100 mg total) by mouth daily. 90 tablet 3  . Alpha-Lipoic Acid 100 MG CAPS Take by mouth.    Marland Kitchen amitriptyline (ELAVIL) 10 MG tablet at bedtime as needed.   5  . aspirin 81 MG tablet Take 81 mg by mouth daily. Reported on 12/24/2015    . buPROPion (WELLBUTRIN XL) 300 MG 24 hr tablet Take 1 tablet (300 mg total) by mouth daily. 90 tablet 1  . celecoxib (CELEBREX) 100 MG capsule Take 1 capsule (100 mg total) by mouth daily as needed. Yearly physical w/labs due in August must see MD for future refills 90 capsule 0  . Coenzyme Q10 (EQL COQ10) 300 MG CAPS Take 1 capsule (300 mg total) daily at 12 noon by mouth. 30 capsule 0  . colchicine 0.6 MG tablet TAKE 2 TABLETS BY MOUTH AT ONSET OF FLARE THEN TAKE 1 TABLET 1 HOUR LATER 15 tablet 0  . DULoxetine (CYMBALTA) 30 MG capsule Take 1 capsule (30 mg total) by  mouth 2 (two) times daily. 180 capsule 1  . fluticasone (CUTIVATE) 0.05 % cream APPLY 3 TIMES A DAY AS NEEDED FOR ITCHING 60 g 1  . furosemide (LASIX) 80 MG tablet Take 1 tablet (80 mg total) by mouth daily. 90 tablet 3  . gabapentin (NEURONTIN) 300 MG capsule 300 mg 3 (three) times daily.     Marland Kitchen levocetirizine (XYZAL) 5 MG tablet TAKE 1 TABLET BY MOUTH EVERY EVENING 90 tablet 3  . levothyroxine (SYNTHROID, LEVOTHROID) 88 MCG tablet Take 88 mcg by mouth daily before breakfast.    . losartan (COZAAR) 25 MG tablet Take 1 tablet (25 mg total) by mouth daily. -- Office visit needed for further refills 90 tablet 0  . Magnesium 100 MG CAPS Take 2 capsules by mouth daily.    Marland Kitchen NOVOLOG MIX 70/30 (70-30) 100 UNIT/ML injection Inject into the skin. 85 units breakfast , 55 units lunch and dinner    . rosuvastatin (CRESTOR) 5 MG tablet Take 1 tablet (5 mg total) by mouth daily. 90 tablet 3  . spironolactone (ALDACTONE) 25 MG tablet TAKE 1 TABLET (25 MG TOTAL) BY MOUTH DAILY. 90 tablet 3  . traMADol (ULTRAM) 50 MG tablet Take by mouth every 12 (twelve) hours as needed.    . vitamin B-12 (CYANOCOBALAMIN) 500 MCG tablet Take 500 mcg by mouth  2 (two) times daily.    . Vitamin D, Ergocalciferol, (DRISDOL) 50000 units CAPS capsule Take 1 capsule (50,000 Units total) by mouth every 3 (three) days. 15 capsule 1  . NOVOLOG MIX 70/30 FLEXPEN (70-30) 100 UNIT/ML FlexPen Inject 85 units with breakfast, 60 units with lunch and 85 units with dinner. (Patient not taking: Reported on 06/21/2018) 225 mL 3  . ONETOUCH VERIO test strip   3   No current facility-administered medications for this visit.     Functional Status:  In your present state of health, do you have any difficulty performing the following activities: 02/03/2018  Hearing? N  Vision? Y  Difficulty concentrating or making decisions? N  Walking or climbing stairs? Y  Dressing or bathing? N  Doing errands, shopping? N  Preparing Food and eating ? N  Using  the Toilet? N  In the past six months, have you accidently leaked urine? Y  Do you have problems with loss of bowel control? N  Managing your Medications? N  Managing your Finances? N  Housekeeping or managing your Housekeeping? Y  Some recent data might be hidden    Fall/Depression Screening: Fall Risk  06/21/2018 04/28/2018 02/03/2018  Falls in the past year? Yes No Yes  Number falls in past yr: 2 or more - 2 or more  Injury with Fall? No - Yes  Comment - - fell off porch scraped knees  Risk Factor Category  High Fall Risk - High Fall Risk  Risk for fall due to : - - History of fall(s);Impaired balance/gait  Follow up - - Education provided   [facility] 2/9 Scores 06/21/2018 05/27/2018 05/06/2018 04/28/2018 02/03/2018 07/29/2017 01/27/2017  PHQ - 2 Score '1 2 4 6 4 6 5  '$ PHQ- 9 Score - '4 12 13 15 22 20    '$ Assessment:  Follow uptelephonefordisease management of Type 2 DM. Member continues tonot meeting diabetes self management goal of hemoglobin A1C less than 7% with last reading increased to 9.9% on 05/17/18. Member had been non-adherent with diet and CBG checks since her Father expired5/18/19.Member has started to take her medications and reports that she saw Dr.Burns on 05/11/18 and Dr.Beasley on 05/17/18 and 06/08/18.  Reports improved depression and is being followed by Education officer, museum.Reports improvements in diet adherence.  Member now checking her CBGs twice a day ranging form 69-204 per her report. Member up to date with annual dilated eye exam and foot exam. Member had flu vaccination and pneumonia vaccine in September 2018.  Plan:   [facility] CM Care Plan Problem One     Most Recent Value  Care Plan Problem One  Knowledge deficit related to diabetes self mangement as evidenced by elevated hemoglobin A1C of 8.8%  Role Documenting the Problem One  Care Management [place] for Problem One  Active  [facility] Long Term Goal   Member will decrease hemoglobinA1C by 1% in the next 90 days    [facility] Long Term Goal Start Date  06/21/18 [Continue  hemoglobin A1C 9.9% on 05/17/18]  [facility] Long Term Goal Met Date  -- [Continue  hemoglobin A1C 9.9% on 05/17/18]  Interventions for Problem One Long Term Goal  Praised for checking her blood sugars twice a day,  Reviewed blood sugar goal pre and post meal, Reinforced to keep appt with Dr.Beasley on 06/28/18 and to show her blood sugar log, Encouraged to write comments on log if out of range as to what foods she had eaten, Reviewed the rule of  15 and actions to take for hypoglycemia  [facility] CM Short Term Goal #1   Member will check CBG once a week for 4 weeks and keep log as evidenced by report from member  [place] Term Goal #1 Start Date  05/20/18  [facility] CM Short Term Goal #1 Met Date  06/21/18  [facility] CM Short Term Goal #2   Member will call Health team concierge to get [employer]ne[employer] information in the next 2 weeks as reported by member  [facility] CM Short Term Goal #2 Start Date  02/03/18  [facility] CM Short Term Goal #2 Met Date  03/02/18  [facility] CM Short Term Goal #3  Member will limit sweet tea to three times a week per report of member for the next 30 days  [facility] CM Short Term Goal #3 Start Date  04/29/18  [facility] CM Short Term Goal #3 Met Date  05/20/18    Plan for telephone visit in one month.  Peter Garter RN, Jackquline Denmark, CDE Care Management Coordinator [facility] Care Management [phone]

## 2018-06-22 ENCOUNTER — Other Ambulatory Visit: Payer: Self-pay | Admitting: *Deleted

## 2018-06-23 ENCOUNTER — Other Ambulatory Visit: Payer: Self-pay | Admitting: *Deleted

## 2018-06-23 ENCOUNTER — Encounter: Payer: Self-pay | Admitting: *Deleted

## 2018-06-23 NOTE — Patient Outreach (Signed)
[facility]) Care Management  06/23/2018  LORICE LAFAVE 11/15/1950 [id number]   CSW was unable to reach pt by phone on 06/22/2018. CSW will send an unsuccessful outreach letter to pt and call again in the next 7 days if no return call is received.    [name], MSW, [place] Worker  [place] [phone]

## 2018-06-28 ENCOUNTER — Ambulatory Visit (INDEPENDENT_AMBULATORY_CARE_PROVIDER_SITE_OTHER): Payer: PPO | Admitting: Family Medicine

## 2018-06-28 ENCOUNTER — Other Ambulatory Visit: Payer: Self-pay | Admitting: Internal Medicine

## 2018-06-28 VITALS — BP 122/75 | HR 76 | Temp 98.0°F | Ht 63.0 in | Wt 271.0 lb

## 2018-06-28 DIAGNOSIS — Z6841 Body Mass Index (BMI) 40.0 and over, adult: Secondary | ICD-10-CM

## 2018-06-28 DIAGNOSIS — Z794 Long term (current) use of insulin: Secondary | ICD-10-CM | POA: Diagnosis not present

## 2018-06-28 DIAGNOSIS — R42 Dizziness and giddiness: Secondary | ICD-10-CM

## 2018-06-28 DIAGNOSIS — E119 Type 2 diabetes mellitus without complications: Secondary | ICD-10-CM | POA: Diagnosis not present

## 2018-06-28 MED FILL — VIT D2 1.25 MG (50,000 UNIT: 1.25 MG | 45 days supply | Qty: 15 | Fill #1

## 2018-06-28 MED FILL — SPIRONOLACTONE 25 MG TABS: 25 | 90 days supply | Qty: 90 | Fill #0

## 2018-06-28 MED FILL — COLCHICINE 0.6 MG TABS: 0.6 | 5 days supply | Qty: 15 | Fill #0

## 2018-06-28 MED FILL — FUROSEMIDE 80 MG TAB: 80 | 90 days supply | Qty: 180 | Fill #0

## 2018-06-28 MED FILL — ALLOPURINOL 100 MG TABLET: 100 | 90 days supply | Qty: 90 | Fill #0

## 2018-06-29 MED FILL — ROSUVASTATIN CALCIUM 5 MG T: 5 | 90 days supply | Qty: 90 | Fill #1

## 2018-06-29 NOTE — Progress Notes (Signed)
Office: [phone]  /  Fax: [phone]   HPI:   Chief Complaint: OBESITY Alice Kemp is here to discuss her progress with her obesity treatment plan. She is on the  keep a food journal with 250-400 calories and 20+ g of protein  for breakfast and Category 3 plan and is following her eating plan approximately 40-50 % of the time. She states she is exercising 0 minutes 0 times per week. Alice Kemp has done well with weight loss last month but has had a GI bug and is unable to eat all of her food. She is starting to feel better and is ready to get back to her eating plan.   Her weight is 271 lb (122.9 kg) today and has not lost weight since her last visit. She has lost 29 lbs since starting treatment with [name].  Diabetes II Alice Kemp has a diagnosis of diabetes type II. Alice Kemp has worsened since being off of medication for 2 months. She is working on eating better but does not have a blood glucose log today. She is currently on Novolog 70/30 due to finances and denies any hypoglycemic episodes. Last A1c was 9.9 She has been working on intensive lifestyle modifications including diet, exercise, and weight loss to help control her blood glucose levels.  Lightheadedness Alice Kemp notes increased lightheadedness after recent GI illness with vomiting and diarrhea. She is not drinking much fluids. Blood pressure is stable and no falls.   ALLERGIES: Allergies  Allergen Reactions  . Sulfa Antibiotics   . Penicillins Rash    MEDICATIONS: Current Outpatient Medications on File Prior to Visit  Medication Sig Dispense Refill  . Alpha-Lipoic Acid 100 MG CAPS Take by mouth.    Alice Kemp Kitchen amitriptyline (ELAVIL) 10 MG tablet at bedtime as needed.   5  . aspirin 81 MG tablet Take 81 mg by mouth daily. Reported on 12/24/2015    . buPROPion (WELLBUTRIN XL) 300 MG 24 hr tablet Take 1 tablet (300 mg total) by mouth daily. 90 tablet 1  . celecoxib (CELEBREX) 100 MG capsule Take 1 capsule (100 mg total) by mouth daily as  needed. Yearly physical w/labs due in August must see MD for future refills 90 capsule 0  . Coenzyme Q10 (EQL COQ10) 300 MG CAPS Take 1 capsule (300 mg total) daily at 12 noon by mouth. 30 capsule 0  . DULoxetine (CYMBALTA) 30 MG capsule Take 1 capsule (30 mg total) by mouth 2 (two) times daily. 180 capsule 1  . fluticasone (CUTIVATE) 0.05 % cream APPLY 3 TIMES A DAY AS NEEDED FOR ITCHING 60 g 1  . furosemide (LASIX) 80 MG tablet Take 1 tablet (80 mg total) by mouth daily. 90 tablet 3  . gabapentin (NEURONTIN) 300 MG capsule 300 mg 3 (three) times daily.     Alice Kemp Kitchen levocetirizine (XYZAL) 5 MG tablet TAKE 1 TABLET BY MOUTH EVERY EVENING 90 tablet 3  . levothyroxine (SYNTHROID, LEVOTHROID) 88 MCG tablet Take 88 mcg by mouth daily before breakfast.    . losartan (COZAAR) 25 MG tablet Take 1 tablet (25 mg total) by mouth daily. -- Office visit needed for further refills 90 tablet 0  . Magnesium 100 MG CAPS Take 2 capsules by mouth daily.    Alice Kemp Kitchen NOVOLOG MIX 70/30 (70-30) 100 UNIT/ML injection Inject into the skin. 85 units breakfast , 55 units lunch and dinner    . NOVOLOG MIX 70/30 FLEXPEN (70-30) 100 UNIT/ML FlexPen Inject 85 units with breakfast, 60 units with lunch and 85  units with dinner. 225 mL 3  . ONETOUCH VERIO test strip   3  . rosuvastatin (CRESTOR) 5 MG tablet Take 1 tablet (5 mg total) by mouth daily. 90 tablet 3  . traMADol (ULTRAM) 50 MG tablet Take by mouth every 12 (twelve) hours as needed.    . vitamin B-12 (CYANOCOBALAMIN) 500 MCG tablet Take 500 mcg by mouth 2 (two) times daily.    . Vitamin D, Ergocalciferol, (DRISDOL) 50000 units CAPS capsule Take 1 capsule (50,000 Units total) by mouth every 3 (three) days. 15 capsule 1   No current facility-administered medications on file prior to visit.     PAST MEDICAL HISTORY: Past Medical History:  Diagnosis Date  . Anxiety   . Arthritis   . Back pain   . Benign neoplasm of colon 06/29/2012  . Breast abscess 07/2011   left; s/p I&D + abx  by gen surg  . Cervical radiculopathy at C6   . CKD (chronic kidney disease) stage 3, GFR 30-59 ml/min (HCC)    follows with renal  . Constipation   . Depression   . Diabetes mellitus type II   . Diastolic heart failure ([place]) 09/2013 echo  . Gall bladder inflammation   . Glaucoma   . Gout   . Hyperlipidemia   . Hypothyroidism   . Joint pain   . Kidney disease   . Leg pain   . Morbid obesity ([place])   . OBSTRUCTIVE SLEEP APNEA 12/23/2010   npsg 2012:  AHI 25/hr, severe desat to 68%.   autoset 2013: optimal pressure 13cm   . PERICARDIAL EFFUSION 12/12/2010  . Pulmonary hypertension ([place])   . Shortness of breath   . Skin lesion of breast 07/09/2012  . Swelling of extremity   . Unspecified hypothyroidism   . Vitamin B12 deficiency   . Vitamin D deficiency     PAST SURGICAL HISTORY: Past Surgical History:  Procedure Laterality Date  . BREAST BIOPSY  1990   Left  . CATARACT EXTRACTION    . COLONOSCOPY  06/29/2012   Procedure: COLONOSCOPY;  Surgeon: Ladene Artist, MD,FACG;  Location: [facility] ENDOSCOPY;  Service: Endoscopy;  Laterality: N/A;  . SHOULDER SURGERY  2010   Left  . TONSILLECTOMY  1964    SOCIAL HISTORY: Social History   Tobacco Use  . Smoking status: Never Smoker  . Smokeless tobacco: Never Used  Substance Use Topics  . Alcohol use: No    Alcohol/week: 0.0 standard drinks    Frequency: Never  . Drug use: No    FAMILY HISTORY: Family History  Problem Relation Age of Onset  . Hypertension Mother   . Heart disease Mother   . Heart attack Mother   . Thyroid disease Mother   . Diabetes Father   . Heart disease Father   . Skin cancer Father   . Cancer Father        skin  . Heart attack Father   . Hyperlipidemia Father   . Colon polyps Father   . Depression Father   . Hypertension Brother   . Skin cancer Paternal Grandfather   . Colon cancer Paternal Grandfather 55  . Breast cancer Paternal Aunt   . Cancer Paternal Aunt        breast  . Diabetes  Paternal Uncle   . Diabetes Maternal Grandfather   . Sudden death Neg Hx     ROS: Review of Systems  Constitutional: Negative for weight loss.  Neurological:  Positive for lightheadedness  Endo/Heme/Allergies:       Negative for hypoglycemia    PHYSICAL EXAM: Blood pressure 122/75, pulse 76, temperature 98 F (36.7 C), temperature source Oral, height 5\' 3"  (1.6 m), weight 271 lb (122.9 kg), SpO2 97 %. Body mass index is 48.01 kg/m. Physical Exam  Constitutional: She is oriented to person, place, and time. She appears well-developed and well-nourished.  HENT:  Head: Normocephalic.  Cardiovascular: Normal rate.  Pulmonary/Chest: Effort normal.  Musculoskeletal: Normal range of motion.  Neurological: She is oriented to person, place, and time.  Skin: Skin is warm and dry.  Psychiatric: She has a normal mood and affect. Her behavior is normal.  Vitals reviewed.   RECENT LABS AND TESTS: BMET    Component Value Date/Time   NA 140 05/17/2018 1128   K 4.6 05/17/2018 1128   CL 100 05/17/2018 1128   CO2 21 05/17/2018 1128   GLUCOSE 217 (H) 05/17/2018 1128   GLUCOSE 130 (H) 01/08/2016 1649   BUN 32 (H) 05/17/2018 1128   CREATININE 1.89 (H) 05/17/2018 1128   CALCIUM 8.9 05/17/2018 1128   GFRNONAA 27 (L) 05/17/2018 1128   GFRAA 31 (L) 05/17/2018 1128   Lab Results  Component Value Date   HGBA1C 9.9 (H) 05/17/2018   HGBA1C 8.8 (H) 12/17/2017   HGBA1C 7.9 (H) 07/30/2017   HGBA1C 8.1 (H) 04/10/2015   HGBA1C 10.0 (H) 04/11/2014   Lab Results  Component Value Date   INSULIN 9.8 05/17/2018   INSULIN 31.9 (H) 12/17/2017   INSULIN 37.2 (H) 07/30/2017   CBC    Component Value Date/Time   WBC 11.0 (H) 05/17/2018 1128   WBC 8.9 09/23/2013 1149   RBC 4.27 05/17/2018 1128   RBC 4.58 09/23/2013 1149   HGB 13.0 05/17/2018 1128   HCT 41.1 05/17/2018 1128   PLT 213 12/15/2016   MCV 96 05/17/2018 1128   MCH 30.4 05/17/2018 1128   MCH 28.1 01/29/2011 0500   MCHC 31.6  05/17/2018 1128   MCHC 33.0 09/23/2013 1149   RDW 14.8 05/17/2018 1128   LYMPHSABS 4.1 (H) 05/17/2018 1128   MONOABS 0.6 09/23/2013 1149   EOSABS 0.2 05/17/2018 1128   BASOSABS 0.0 05/17/2018 1128   Iron/TIBC/Ferritin/ %Sat No results found for: IRON, TIBC, FERRITIN, IRONPCTSAT Lipid Panel     Component Value Date/Time   CHOL 190 05/17/2018 1128   TRIG 199 (H) 05/17/2018 1128   HDL 35 (L) 05/17/2018 1128   CHOLHDL 4 04/10/2015 1212   VLDL 38.8 04/10/2015 1212   LDLCALC 115 (H) 05/17/2018 1128   LDLDIRECT 54.6 05/10/2012 1440   Hepatic Function Panel     Component Value Date/Time   PROT 6.9 05/17/2018 1128   ALBUMIN 3.8 05/17/2018 1128   AST 11 05/17/2018 1128   ALT 9 05/17/2018 1128   ALKPHOS 76 05/17/2018 1128   BILITOT 0.4 05/17/2018 1128   BILIDIR 0.1 05/10/2012 1440      Component Value Date/Time   TSH 2.610 07/30/2017 1020   TSH 1.62 04/10/2015 1212   TSH 1.65 09/23/2013 1149    ASSESSMENT AND PLAN: Type 2 diabetes mellitus without complication, with long-term current use of insulin (HCC)  Lightheaded  Class 3 severe obesity with serious comorbidity and body mass index (BMI) of 45.0 to 49.9 in adult, unspecified obesity type ([place])  PLAN: Diabetes II Alice Kemp has been given extensive diabetes education by myself today including ideal fasting and post-prandial blood glucose readings, individual ideal HgA1c goals  and hypoglycemia  prevention. We discussed the importance of good blood sugar control to decrease the likelihood of diabetic complications such as nephropathy, neuropathy, limb loss, blindness, coronary artery disease, and death. We discussed the importance of intensive lifestyle modification including diet, exercise and weight loss as the first line treatment for diabetes. Alice Kemp agrees to continue her diabetes medications, watch for signs of hypoglycemia and will follow up at the agreed upon time. She agrees to contact our office if blood glucose is low. We  will recheck labs in 2 months.   Lightheadedness Alice Kemp agrees to increase water intake or calories for liquids to rehydrate. She agrees to follow up with our clinic in 2-3 weeks.   I spent > than 50% of the 15 minute visit on counseling as documented in the note.  Obesity Alice Kemp is currently in the action stage of change. As such, her goal is to continue with weight loss efforts She has agreed to keep a food journal with 250-400  calories and 20+ g of  protein  at breakfast and Category 3 plan.  Alice Kemp has been instructed to work up to a goal of 150 minutes of combined cardio and strengthening exercise per week for weight loss and overall health benefits. We discussed the following Behavioral Modification Strategies today: increasing lean protein intake and increase water intake.    Alice Kemp has agreed to follow up with our clinic in 2-3 weeks. She was informed of the importance of frequent follow up visits to maximize her success with intensive lifestyle modifications for her multiple health conditions.   OBESITY BEHAVIORAL INTERVENTION VISIT  Today's visit was # 14   Starting weight: 300 lb Starting date: 07/29/17 Today's weight : 271 lb Today's date:06/28/18 Total lbs lost to date: 29 lb    ASK: We discussed the diagnosis of obesity with Alice Kemp today and Alice Kemp agreed to give [name] permission to discuss obesity behavioral modification therapy today.  ASSESS: Alice Kemp has the diagnosis of obesity and her BMI today is 48.02 Alice Kemp is in the action stage of change   ADVISE: Alice Kemp was educated on the multiple health risks of obesity as well as the benefit of weight loss to improve her health. She was advised of the need for long term treatment and the importance of lifestyle modifications to improve her current health and to decrease her risk of future health problems.  AGREE: Multiple dietary modification options and treatment options were discussed and  Alice Kemp agreed to  follow the recommendations documented in the above note.  ARRANGE: Alice Kemp was educated on the importance of frequent visits to treat obesity as outlined per CMS and USPSTF guidelines and agreed to schedule her next follow up appointment today.  I, Renee Ramus, am acting as transcriptionist for Dennard Nip, MD   I have reviewed the above documentation for accuracy and completeness, and I agree with the above. -Dennard Nip, MD

## 2018-06-30 ENCOUNTER — Ambulatory Visit: Payer: Self-pay | Admitting: *Deleted

## 2018-06-30 ENCOUNTER — Telehealth: Payer: Self-pay | Admitting: Internal Medicine

## 2018-06-30 NOTE — Telephone Encounter (Signed)
Spoke with patient regarding AWV. Patient stated that she would like to schedule her wellness visit next year (2020) sometime in August. SF

## 2018-07-02 ENCOUNTER — Ambulatory Visit (INDEPENDENT_AMBULATORY_CARE_PROVIDER_SITE_OTHER): Payer: PPO

## 2018-07-02 DIAGNOSIS — Z23 Encounter for immunization: Secondary | ICD-10-CM | POA: Diagnosis not present

## 2018-07-14 MED FILL — ONETOUCH VERIO TEST STRIP: 33 days supply | Qty: 200 | Fill #0

## 2018-07-19 ENCOUNTER — Ambulatory Visit: Payer: PPO

## 2018-07-19 ENCOUNTER — Ambulatory Visit (INDEPENDENT_AMBULATORY_CARE_PROVIDER_SITE_OTHER): Payer: PPO | Admitting: Family Medicine

## 2018-07-19 VITALS — BP 112/70 | HR 77 | Temp 98.0°F | Ht 63.0 in | Wt 273.0 lb

## 2018-07-19 DIAGNOSIS — G4700 Insomnia, unspecified: Secondary | ICD-10-CM

## 2018-07-19 DIAGNOSIS — Z6841 Body Mass Index (BMI) 40.0 and over, adult: Secondary | ICD-10-CM | POA: Diagnosis not present

## 2018-07-19 DIAGNOSIS — F3289 Other specified depressive episodes: Secondary | ICD-10-CM | POA: Diagnosis not present

## 2018-07-21 NOTE — Progress Notes (Signed)
Office: [phone]  /  Fax: [phone]   HPI:   Chief Complaint: OBESITY Alice Kemp is here to discuss her progress with her obesity treatment plan. She is on the  keep a food journal with 250 to 400 calories and 20+ grams of protein at breakfast daily and the Category 3 plan and is following her eating plan approximately 50 % of the time. She states she is exercising 0 minutes 0 times per week. Alice Kemp is struggling with motivation to lose weight, but she is frustrated with her lack of motivation and she would like suggestions of how to get back on track. Her depression has worsened recently. Her weight is 273 lb (123.8 kg) today and has had a weight gain of 2 pounds over a period of 3 weeks since her last visit. She has lost 27 lbs since starting treatment with [name].  Insomnia Alice Kemp is not sleeping well. She is only sleeping approximately five hours per night and she sometimes naps in the daytime. Alice Kemp has poor sleep hygiene.  Depression with emotional eating behaviors Alice Kemp is on wellbutrin and cymbalta and her mood has been stable; but she has been feeling discouraged and she notes increased emotional eating. Alice Kemp struggles with emotional eating and using food for comfort to the extent that it is negatively impacting her health. She often snacks when she is not hungry. Alice Kemp sometimes feels she is out of control and then feels guilty that she made poor food choices. She has been working on behavior modification techniques to help reduce her emotional eating and has been somewhat successful. She shows no sign of suicidal or homicidal ideations.  Depression screen [facility] 2/9 06/21/2018 05/27/2018 05/06/2018 04/28/2018 02/03/2018  Decreased Interest 1 1 2 3 3   Down, Depressed, Hopeless 0 1 2 3 1   PHQ - 2 Score 1 2 4 6 4   Altered sleeping - 1 2 1 1   Tired, decreased energy - 1 1 1 3   Change in appetite - 0 2 - 2  Feeling bad or failure about yourself  - 0 1 3 3   Trouble concentrating - 0 1 1 1     Moving slowly or fidgety/restless - 0 1 1 -  Suicidal thoughts - 0 0 0 1  PHQ-9 Score - 4 12 13 15   Difficult doing work/chores - - Somewhat difficult Somewhat difficult Somewhat difficult      ALLERGIES: Allergies  Allergen Reactions  . Sulfa Antibiotics   . Penicillins Rash    MEDICATIONS: Current Outpatient Medications on File Prior to Visit  Medication Sig Dispense Refill  . allopurinol (ZYLOPRIM) 100 MG tablet TAKE 1 TABLET BY MOUTH ONCE DAILY 90 tablet 1  . Alpha-Lipoic Acid 100 MG CAPS Take by mouth.    Marland Kitchen amitriptyline (ELAVIL) 10 MG tablet at bedtime as needed.   5  . aspirin 81 MG tablet Take 81 mg by mouth daily. Reported on 12/24/2015    . buPROPion (WELLBUTRIN XL) 300 MG 24 hr tablet Take 1 tablet (300 mg total) by mouth daily. 90 tablet 1  . celecoxib (CELEBREX) 100 MG capsule Take 1 capsule (100 mg total) by mouth daily as needed. Yearly physical w/labs due in August must see MD for future refills 90 capsule 0  . Coenzyme Q10 (EQL COQ10) 300 MG CAPS Take 1 capsule (300 mg total) daily at 12 noon by mouth. 30 capsule 0  . colchicine 0.6 MG tablet TAKE 2 TABLETS BY MOUTH AT ONSET OF FLARE THEN TAKE 1 TABLET 1  HOUR LATER 15 tablet 0  . DULoxetine (CYMBALTA) 30 MG capsule Take 1 capsule (30 mg total) by mouth 2 (two) times daily. 180 capsule 1  . fluticasone (CUTIVATE) 0.05 % cream APPLY 3 TIMES A DAY AS NEEDED FOR ITCHING 60 g 1  . furosemide (LASIX) 80 MG tablet Take 1 tablet (80 mg total) by mouth daily. 90 tablet 3  . gabapentin (NEURONTIN) 300 MG capsule 300 mg 3 (three) times daily.     Marland Kitchen levocetirizine (XYZAL) 5 MG tablet TAKE 1 TABLET BY MOUTH EVERY EVENING 90 tablet 3  . levothyroxine (SYNTHROID, LEVOTHROID) 88 MCG tablet Take 88 mcg by mouth daily before breakfast.    . losartan (COZAAR) 25 MG tablet Take 1 tablet (25 mg total) by mouth daily. -- Office visit needed for further refills 90 tablet 0  . Magnesium 100 MG CAPS Take 2 capsules by mouth daily.    Marland Kitchen  NOVOLOG MIX 70/30 (70-30) 100 UNIT/ML injection Inject into the skin. 85 units breakfast , 55 units lunch and dinner    . ONETOUCH VERIO test strip   3  . rosuvastatin (CRESTOR) 5 MG tablet Take 1 tablet (5 mg total) by mouth daily. 90 tablet 3  . spironolactone (ALDACTONE) 25 MG tablet TAKE 1 TABLET (25 MG TOTAL) BY MOUTH DAILY. 90 tablet 1  . traMADol (ULTRAM) 50 MG tablet Take by mouth every 12 (twelve) hours as needed.    . vitamin B-12 (CYANOCOBALAMIN) 500 MCG tablet Take 500 mcg by mouth 2 (two) times daily.    . Vitamin D, Ergocalciferol, (DRISDOL) 50000 units CAPS capsule Take 1 capsule (50,000 Units total) by mouth every 3 (three) days. 15 capsule 1   No current facility-administered medications on file prior to visit.     PAST MEDICAL HISTORY: Past Medical History:  Diagnosis Date  . Anxiety   . Arthritis   . Back pain   . Benign neoplasm of colon 06/29/2012  . Breast abscess 07/2011   left; s/p I&D + abx by gen surg  . Cervical radiculopathy at C6   . CKD (chronic kidney disease) stage 3, GFR 30-59 ml/min (HCC)    follows with renal  . Constipation   . Depression   . Diabetes mellitus type II   . Diastolic heart failure ([place]) 09/2013 echo  . Gall bladder inflammation   . Glaucoma   . Gout   . Hyperlipidemia   . Hypothyroidism   . Joint pain   . Kidney disease   . Leg pain   . Morbid obesity ([place])   . OBSTRUCTIVE SLEEP APNEA 12/23/2010   npsg 2012:  AHI 25/hr, severe desat to 68%.   autoset 2013: optimal pressure 13cm   . PERICARDIAL EFFUSION 12/12/2010  . Pulmonary hypertension ([place])   . Shortness of breath   . Skin lesion of breast 07/09/2012  . Swelling of extremity   . Unspecified hypothyroidism   . Vitamin B12 deficiency   . Vitamin D deficiency     PAST SURGICAL HISTORY: Past Surgical History:  Procedure Laterality Date  . BREAST BIOPSY  1990   Left  . CATARACT EXTRACTION    . COLONOSCOPY  06/29/2012   Procedure: COLONOSCOPY;  Surgeon: Ladene Artist,  MD,FACG;  Location: [facility];  Service: Endoscopy;  Laterality: N/A;  . SHOULDER SURGERY  2010   Left  . TONSILLECTOMY  1964    SOCIAL HISTORY: Social History   Tobacco Use  . Smoking status: Never Smoker  . Smokeless  tobacco: Never Used  Substance Use Topics  . Alcohol use: No    Alcohol/week: 0.0 standard drinks    Frequency: Never  . Drug use: No    FAMILY HISTORY: Family History  Problem Relation Age of Onset  . Hypertension Mother   . Heart disease Mother   . Heart attack Mother   . Thyroid disease Mother   . Diabetes Father   . Heart disease Father   . Skin cancer Father   . Cancer Father        skin  . Heart attack Father   . Hyperlipidemia Father   . Colon polyps Father   . Depression Father   . Hypertension Brother   . Skin cancer Paternal Grandfather   . Colon cancer Paternal Grandfather 4  . Breast cancer Paternal Aunt   . Cancer Paternal Aunt        breast  . Diabetes Paternal Uncle   . Diabetes Maternal Grandfather   . Sudden death Neg Hx     ROS: Review of Systems  Constitutional: Negative for weight loss.  Psychiatric/Behavioral: Positive for depression. Negative for suicidal ideas.    PHYSICAL EXAM: Blood pressure 112/70, pulse 77, temperature 98 F (36.7 C), temperature source Oral, height 5\' 3"  (1.6 m), weight 273 lb (123.8 kg), SpO2 98 %. Body mass index is 48.36 kg/m. Physical Exam  Constitutional: She is oriented to person, place, and time. She appears well-developed and well-nourished.  Cardiovascular: Normal rate.  Pulmonary/Chest: Effort normal.  Musculoskeletal: Normal range of motion.  Neurological: She is oriented to person, place, and time.  Skin: Skin is warm and dry.  Psychiatric: She expresses no homicidal and no suicidal ideation.  Vitals reviewed.   RECENT LABS AND TESTS: BMET    Component Value Date/Time   NA 140 05/17/2018 1128   K 4.6 05/17/2018 1128   CL 100 05/17/2018 1128   CO2 21 05/17/2018 1128    GLUCOSE 217 (H) 05/17/2018 1128   GLUCOSE 130 (H) 01/08/2016 1649   BUN 32 (H) 05/17/2018 1128   CREATININE 1.89 (H) 05/17/2018 1128   CALCIUM 8.9 05/17/2018 1128   GFRNONAA 27 (L) 05/17/2018 1128   GFRAA 31 (L) 05/17/2018 1128   Lab Results  Component Value Date   HGBA1C 9.9 (H) 05/17/2018   HGBA1C 8.8 (H) 12/17/2017   HGBA1C 7.9 (H) 07/30/2017   HGBA1C 8.1 (H) 04/10/2015   HGBA1C 10.0 (H) 04/11/2014   Lab Results  Component Value Date   INSULIN 9.8 05/17/2018   INSULIN 31.9 (H) 12/17/2017   INSULIN 37.2 (H) 07/30/2017   CBC    Component Value Date/Time   WBC 11.0 (H) 05/17/2018 1128   WBC 8.9 09/23/2013 1149   RBC 4.27 05/17/2018 1128   RBC 4.58 09/23/2013 1149   HGB 13.0 05/17/2018 1128   HCT 41.1 05/17/2018 1128   PLT 213 12/15/2016   MCV 96 05/17/2018 1128   MCH 30.4 05/17/2018 1128   MCH 28.1 01/29/2011 0500   MCHC 31.6 05/17/2018 1128   MCHC 33.0 09/23/2013 1149   RDW 14.8 05/17/2018 1128   LYMPHSABS 4.1 (H) 05/17/2018 1128   MONOABS 0.6 09/23/2013 1149   EOSABS 0.2 05/17/2018 1128   BASOSABS 0.0 05/17/2018 1128   Iron/TIBC/Ferritin/ %Sat No results found for: IRON, TIBC, FERRITIN, IRONPCTSAT Lipid Panel     Component Value Date/Time   CHOL 190 05/17/2018 1128   TRIG 199 (H) 05/17/2018 1128   HDL 35 (L) 05/17/2018 1128   CHOLHDL 4  04/10/2015 1212   VLDL 38.8 04/10/2015 1212   LDLCALC 115 (H) 05/17/2018 1128   LDLDIRECT 54.6 05/10/2012 1440   Hepatic Function Panel     Component Value Date/Time   PROT 6.9 05/17/2018 1128   ALBUMIN 3.8 05/17/2018 1128   AST 11 05/17/2018 1128   ALT 9 05/17/2018 1128   ALKPHOS 76 05/17/2018 1128   BILITOT 0.4 05/17/2018 1128   BILIDIR 0.1 05/10/2012 1440      Component Value Date/Time   TSH 2.610 07/30/2017 1020   TSH 1.62 04/10/2015 1212   TSH 1.65 09/23/2013 1149   Results for Mullan, MCKINNA DEMARS (MRN [id number]) as of 07/21/2018 13:55  Ref. Range 05/17/2018 11:28  Vitamin D, 25-Hydroxy Latest Ref Range: 30.0  - 100.0 ng/mL 23.6 (L)   ASSESSMENT AND PLAN: Insomnia, unspecified type  Other depression - with emotional eating  Class 3 severe obesity with serious comorbidity and body mass index (BMI) of 45.0 to 49.9 in adult, unspecified obesity type ([place])  PLAN:  Insomnia Alice Kemp was advised that it is okay to start OTC melatonin 5 mg qHS and we will follow. Proper sleep hygiene was discussed including the need for 7-8 hours of quality sleep each night.  Depression with Emotional Eating Behaviors We discussed behavior modification techniques today to help Alice Kemp deal with her emotional eating and depression. She will continue her medications as prescribed and follow up as directed. We have referred Alice Kemp to Dr. Mallie Mussel our bariatric psychologist  I spent > than 50% of the 25 minute visit on counseling as documented in the note.  Obesity Alice Kemp is currently in the action stage of change. As such, her goal is to continue with weight loss efforts She has agreed to follow the Category 3 plan Alice Kemp has been instructed to work up to a goal of 150 minutes of combined cardio and strengthening exercise per week for weight loss and overall health benefits. We discussed the following Behavioral Modification Strategies today: emotional eating strategies  We will refer to Dr. Mallie Mussel our bariatric psychologist to help with motivation and help improve her readiness to change.  Alice Kemp has agreed to follow up with our clinic in 4 weeks and follow up with Dr. Mallie Mussel our bariatric psychologist in 2 weeks. She was informed of the importance of frequent follow up visits to maximize her success with intensive lifestyle modifications for her multiple health conditions.   OBESITY BEHAVIORAL INTERVENTION VISIT  Today's visit was # 15   Starting weight: 300 lbs Starting date: 07/29/17 Today's weight : 273 lbs Today's date: 07/19/2018 Total lbs lost to date: 50   ASK: We discussed the diagnosis of obesity with  Alice Kemp today and Alice Kemp agreed to give [name] permission to discuss obesity behavioral modification therapy today.  ASSESS: Alice Kemp has the diagnosis of obesity and her BMI today is 48.37 Alice Kemp is in the action stage of change   ADVISE: Alice Kemp was educated on the multiple health risks of obesity as well as the benefit of weight loss to improve her health. She was advised of the need for long term treatment and the importance of lifestyle modifications to improve her current health and to decrease her risk of future health problems.  AGREE: Multiple dietary modification options and treatment options were discussed and  Alice Kemp agreed to follow the recommendations documented in the above note.  ARRANGE: Alice Kemp was educated on the importance of frequent visits to treat obesity as outlined per CMS and USPSTF guidelines and agreed to  schedule her next follow up appointment today.  I, Doreene Nest, am acting as transcriptionist for Dennard Nip, MD  I have reviewed the above documentation for accuracy and completeness, and I agree with the above. -Dennard Nip, MD

## 2018-07-26 ENCOUNTER — Other Ambulatory Visit: Payer: Self-pay

## 2018-07-26 DIAGNOSIS — IMO0002 Reserved for concepts with insufficient information to code with codable children: Secondary | ICD-10-CM

## 2018-07-26 DIAGNOSIS — E1122 Type 2 diabetes mellitus with diabetic chronic kidney disease: Secondary | ICD-10-CM

## 2018-07-26 DIAGNOSIS — E1165 Type 2 diabetes mellitus with hyperglycemia: Principal | ICD-10-CM

## 2018-07-26 DIAGNOSIS — N183 Chronic kidney disease, stage 3 (moderate): Principal | ICD-10-CM

## 2018-07-26 NOTE — Patient Outreach (Signed)
[facility]) Care Management  07/26/2018   Alice Kemp September 09, 1951 [id number]  Telephone visit for follow up  for diabetes quality improvement program  for self management of Type 2 diabetes   Subjective: Member states that she had been having more difficulty watching her diet and stress eating.  States that Dr. Leafy Ro wants her to see someone in her office to talk about how to deal with stress eating.  States her blood sugars were high when she saw her and she has not been checking her blood sugars for the last few weeks.  States she ran out of strips but she filled the Rx but they are still in her car.  States she adopted 2 pug dogs last week.  States she has been more active and she is walking the dogs twice a day for about 5-10 minutes.  States she is now sleeping much better since getting the dogs and her mood is improved.  States she is not watching her diet closely and she skips breakfast most days.  Objective:  Member has hx of Type 2 DM, Stage 3 chronic kidney disease, HTN, hyperlipidemia, gout, depression and chronic back pain per review of medical record. Hemoglobin A1C9.9% on 05/17/18  Current Medications:  Current Outpatient Medications  Medication Sig Dispense Refill  . allopurinol (ZYLOPRIM) 100 MG tablet TAKE 1 TABLET BY MOUTH ONCE DAILY 90 tablet 1  . Alpha-Lipoic Acid 100 MG CAPS Take by mouth.    Marland Kitchen amitriptyline (ELAVIL) 10 MG tablet at bedtime as needed.   5  . aspirin 81 MG tablet Take 81 mg by mouth daily. Reported on 12/24/2015    . buPROPion (WELLBUTRIN XL) 300 MG 24 hr tablet Take 1 tablet (300 mg total) by mouth daily. 90 tablet 1  . celecoxib (CELEBREX) 100 MG capsule Take 1 capsule (100 mg total) by mouth daily as needed. Yearly physical w/labs due in August must see MD for future refills 90 capsule 0  . Coenzyme Q10 (EQL COQ10) 300 MG CAPS Take 1 capsule (300 mg total) daily at 12 noon by mouth. 30 capsule 0  . colchicine 0.6 MG tablet TAKE  2 TABLETS BY MOUTH AT ONSET OF FLARE THEN TAKE 1 TABLET 1 HOUR LATER 15 tablet 0  . DULoxetine (CYMBALTA) 30 MG capsule Take 1 capsule (30 mg total) by mouth 2 (two) times daily. 180 capsule 1  . fluticasone (CUTIVATE) 0.05 % cream APPLY 3 TIMES A DAY AS NEEDED FOR ITCHING 60 g 1  . furosemide (LASIX) 80 MG tablet Take 1 tablet (80 mg total) by mouth daily. 90 tablet 3  . gabapentin (NEURONTIN) 300 MG capsule 300 mg 3 (three) times daily.     Marland Kitchen levocetirizine (XYZAL) 5 MG tablet TAKE 1 TABLET BY MOUTH EVERY EVENING 90 tablet 3  . levothyroxine (SYNTHROID, LEVOTHROID) 88 MCG tablet Take 88 mcg by mouth daily before breakfast.    . losartan (COZAAR) 25 MG tablet Take 1 tablet (25 mg total) by mouth daily. -- Office visit needed for further refills 90 tablet 0  . Magnesium 100 MG CAPS Take 2 capsules by mouth daily.    Marland Kitchen NOVOLOG MIX 70/30 (70-30) 100 UNIT/ML injection Inject into the skin. 85 units breakfast , 55 units lunch and dinner    . ONETOUCH VERIO test strip   3  . rosuvastatin (CRESTOR) 5 MG tablet Take 1 tablet (5 mg total) by mouth daily. 90 tablet 3  . spironolactone (ALDACTONE) 25 MG tablet TAKE  1 TABLET (25 MG TOTAL) BY MOUTH DAILY. 90 tablet 1  . traMADol (ULTRAM) 50 MG tablet Take by mouth every 12 (twelve) hours as needed.    . vitamin B-12 (CYANOCOBALAMIN) 500 MCG tablet Take 500 mcg by mouth 2 (two) times daily.    . Vitamin D, Ergocalciferol, (DRISDOL) 50000 units CAPS capsule Take 1 capsule (50,000 Units total) by mouth every 3 (three) days. 15 capsule 1   No current facility-administered medications for this visit.     Functional Status:  In your present state of health, do you have any difficulty performing the following activities: 02/03/2018  Hearing? N  Vision? Y  Difficulty concentrating or making decisions? N  Walking or climbing stairs? Y  Dressing or bathing? N  Doing errands, shopping? N  Preparing Food and eating ? N  Using the Toilet? N  In the past six  months, have you accidently leaked urine? Y  Do you have problems with loss of bowel control? N  Managing your Medications? N  Managing your Finances? N  Housekeeping or managing your Housekeeping? Y  Some recent data might be hidden    Fall/Depression Screening: Fall Risk  06/21/2018 04/28/2018 02/03/2018  Falls in the past year? Yes No Yes  Number falls in past yr: 2 or more - 2 or more  Injury with Fall? No - Yes  Comment - - fell off porch scraped knees  Risk Factor Category  High Fall Risk - High Fall Risk  Risk for fall due to : - - History of fall(s);Impaired balance/gait  Follow up - - Education provided   [facility] 2/9 Scores 07/26/2018 06/21/2018 05/27/2018 05/06/2018 04/28/2018 02/03/2018 07/29/2017  PHQ - 2 Score '1 1 2 4 6 4 6  '$ PHQ- 9 Score - - '4 12 13 15 22    '$ Assessment:  Follow uptelephonefordisease management of Type 2 DM. Member continues tonot meeting diabetes self management goal of hemoglobin A1C less than 7% with last reading of 9.9% on 05/17/18. Member had been non-adherent with diet and CBG checks. Member  reports that she saw Dr.Beasley on 07/19/18 and 06/08/18. Reports improved depression.  Reports improved sleep and energy since getting her dogs. Reports not taking CBGs and strips in her car.Member up to date with annual dilated eye exam and foot exam. Member had flu vaccination in September 2019.  Member agrees to only goal she will work on is to increase her walking the dogs to 3 times a day.  Plan:   [facility] CM Care Plan Problem One     Most Recent Value  Care Plan Problem One  Knowledge deficit related to diabetes self mangement as evidenced by elevated hemoglobin A1C of 8.8%  Role Documenting the Problem One  Care Management [place] for Problem One  Active  [facility] Long Term Goal   Member will decrease hemoglobinA1C by 1% in the next 90 days   [facility] Long Term Goal Start Date  06/21/18 [Continue  hemoglobin A1C 9.9% on 05/17/18]  [facility] Long Term Goal Met  Date  -- [Continue  hemoglobin A1C 9.9% on 05/17/18]  Interventions for Problem One Long Term Goal  Encouraged to start checking CBGs again and to go out to her car to get her strips, Instructed on the importance of eating breakfast and to not skip meals,   [facility] CM Short Term Goal #1   Member will check CBG once a week for 4 weeks and keep log as evidenced by report from member  [facility] CM Short Term Goal #1 Start Date  05/20/18  [facility] CM Short Term Goal #1 Met Date  06/21/18  [facility] CM Short Term Goal #2   Member will walk dogs 3 times a day for 5-10 minutes   [facility] CM Short Term Goal #2 Start Date  07/26/18  Interventions for Short Term Goal #2  Praised for being more active since getting her dogs, discussed ways to increase activity and member decided to increase the frequency that she takes her dogs out to 3 times a day     Plan for telephone visit in December.Peter Garter RN, Jackquline [place], CDE Care Management Coordinator [facility] [place] Care Management [phone]
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
## 2018-08-02 ENCOUNTER — Ambulatory Visit (INDEPENDENT_AMBULATORY_CARE_PROVIDER_SITE_OTHER): Payer: PPO | Admitting: Psychology

## 2018-08-02 ENCOUNTER — Other Ambulatory Visit: Payer: Self-pay | Admitting: *Deleted

## 2018-08-02 DIAGNOSIS — F331 Major depressive disorder, recurrent, moderate: Secondary | ICD-10-CM

## 2018-08-02 NOTE — Patient Outreach (Signed)
[facility]) Care Management  08/02/2018  Alice Kemp [birth year] [phone]   CSW has not been able to reach pt by phone and has been advised by [facility] RN, Peter Garter, that pt's depression is much improved.  CSW will close case/referral and advise [facility] team, PCP and patient.   [name], MSW, [place] Worker  [place] [phone]

## 2018-08-03 DIAGNOSIS — N184 Chronic kidney disease, stage 4 (severe): Secondary | ICD-10-CM | POA: Diagnosis not present

## 2018-08-05 DIAGNOSIS — N184 Chronic kidney disease, stage 4 (severe): Secondary | ICD-10-CM | POA: Diagnosis not present

## 2018-08-05 DIAGNOSIS — N2581 Secondary hyperparathyroidism of renal origin: Secondary | ICD-10-CM | POA: Diagnosis not present

## 2018-08-05 DIAGNOSIS — D631 Anemia in chronic kidney disease: Secondary | ICD-10-CM | POA: Diagnosis not present

## 2018-08-05 DIAGNOSIS — I129 Hypertensive chronic kidney disease with stage 1 through stage 4 chronic kidney disease, or unspecified chronic kidney disease: Secondary | ICD-10-CM | POA: Diagnosis not present

## 2018-08-09 NOTE — Progress Notes (Signed)
Office: [phone]  /  Fax: [phone]   Date: August 16, 2018   Time Seen: 9:33am Duration: 30 minutes Provider: Glennie Isle, Psy.D. Type of Session: Individual Therapy   HPI: Alice Kemp was referred by Dr. Dennard Nip due to depression with emotional eating behaviors. She was seen for an initial appointment by this provider on August 02, 2018. Per the note for the visit with Dr. Dennard Nip on July 19, 2018, "Sandrais on wellbutrin and cymbalta and her mood has been stable; but she has been feeling discouraged and she notes increased emotional eating. Alice Kemp struggleswith emotional eating and using food for comfort to the extent that it is negatively impacting herhealth. Sheoften snacks when sheis not hungry. Sandrasometimes feels sheis out of control and then feels guilty that shemade poor food choices. Alice Kemp been working on behavior modification techniques to help reduce heremotional eating and has been somewhat successful.Sheshows no sign of suicidal or homicidal ideations." In addition, per the note for the initial visit with Dr. Dennard Nip on July 29, 2017, Alice Kemp has been heavy most of her life and she started gaining weight around 2010 to 2013. Her heaviest weight ever was 309 pounds. During the initial appointment with Dr. Leafy Ro, Alice Kemp also reported experiencing the following: significant food cravings issues; snacking frequently in the evenings; skipping meals frequently; frequently drinking liquids with calories; frequently eating larger portions than normal; and struggling with emotional eating. Alice Kemp's Food and Mood (modified PHQ-9) score was22 for the initial appointment with Dr. Leafy Ro. During the initial appointment with this provider, Alice Kemp reported a fluctuation in her weight loss. Currently, she follows the prescribed meal plan 40% of the time and discussed ongoing stressors. More specifically, when her past father passed away in 03/02/18,  [name] took a break from the program with this clinic from 03-03-23 until August. She noted the last time she engaged in emotional and binge eating was early October. In addition, [name] shared that she typically skips breakfast and her first meal of the day is usually after 12 PM. Alice Kemp reported engaging in emotional and binge eating all her life. She denied a history of engagement in purging and other compensatory strategies. She also denied ever being diagnosed with an eating disorder. When asked about what binge episode looks like for Alice Kemp, she reported consuming a bag of fun size candy bars. Furthermore, Alice Kemp was asked to complete a questionnaire assessing various behaviors related to emotional eating. Alice Kemp endorsed the following: overeat when you are celebrating, experience food cravings on a regular basis, eat certain foods when you are anxious, stressed, depressed, or your feelings are hurt, use food to help you cope with emotional situations, find food is comforting to you, overeat when you are angry or upset, overeat when you are worried about something, overeat frequently when you are bored or lonely, overeat when you are alone, but eat much less when you are with other people and eat as a reward.  Session Content: Session focused on the following treatment goal: decrease emotional eating. The session was initiated with the administration of the PHQ-9 and GAD-7, as well as a brief check-in. Alice Kemp reported ongoing stressors, including not having heating in her house and her dogs running away yesterday. She explained that she was able to locate her dogs. Regarding emotional versus physical hunger, she reported experiencing emotional hunger a couple times a day. Alice Kemp indicated that approximately 50% of the time, she was able to stop herself from eating. However, it was identified that  she experienced difficulty consuming all the food on the prescribed meal plan. Alice Kemp reportedly approximately 40% of  what is noted on the meal plan. As such, this provider explored what Alice Kemp was consuming when experiencing emotional hunger and it was recommended that she eat foods from the prescribed meal plan when experiencing emotional hunger. Alice Kemp agreed. In addition, she shared she has been thinking about her father her a lot in the past week and noted that he passed away in 03/27/2023 of this year. Thus, today's appointment focused briefly on processing thoughts and feelings associated with the aforementioned.  This provider recommended grief therapy; however, Alice Kemp described experiencing ambivalence. Nonetheless, she was receptive to thinking about it between now and the next appointment. Brief psychoeducation regarding triggers for emotional eating was discussed. Alice Kemp was provided with a handout. Overall, Alice Kemp was receptive to today's appointment as evidenced by her openness to sharing, responsiveness to feedback, and willingness to identify her triggers for emotional eating.  Mental Status Examination: Alice Kemp arrived on time for the appointment; however, the appointment was initiated a few minutes late due to this provider. She presented as appropriately dressed and groomed. Alice Kemp appeared her stated age and demonstrated adequate orientation to time, place, person, and purpose of the appointment. She also demonstrated appropriate eye contact. No psychomotor abnormalities or behavioral peculiarities noted. Her mood was euthymic with congruent affect. Her thought processes were logical, linear, and goal-directed. No hallucinations, delusions, bizarre thinking or behavior reported or observed. Judgment, insight, and impulse control appeared to be grossly intact. There was no evidence of paraphasias (i.e., errors in speech, gross mispronunciations, and word substitutions), repetition deficits, or disturbances in volume or prosody (i.e., rhythm and intonation). There was no evidence of attention or memory impairments.  Alice Kemp denied current suicidal and homicidal ideation, intent or plan.  Structured Assessment Results: The Patient Health Questionnaire-9 (PHQ-9) is a self-report measure that assesses symptoms and severity of depression over the course of the last two weeks. Alice Kemp obtained a score of 13 suggesting moderate depression. Alice Kemp finds the endorsed symptoms to be somewhat difficult. Depression screen PHQ 2/9 08/16/2018  Decreased Interest 2  Down, Depressed, Hopeless 2  PHQ - 2 Score 4  Altered sleeping 2  Tired, decreased energy 2  Change in appetite 2  Feeling bad or failure about yourself  1  Trouble concentrating 2  Moving slowly or fidgety/restless 0  Suicidal thoughts 0  PHQ-9 Score 13  Difficult doing work/chores -  Some recent data might be hidden   The Generalized Anxiety Disorder-7 (GAD-7) is a brief self-report measure that assesses symptoms of anxiety over the course of the last two weeks. Alice Kemp obtained a score of seven suggesting mild anxiety. GAD 7 : Generalized Anxiety Score 08/16/2018  Nervous, Anxious, on Edge 2  Control/stop worrying 2  Worry too much - different things 0  Trouble relaxing 1  Restless 1  Easily annoyed or irritable 1  Afraid - awful might happen 0  Total GAD 7 Score 7  Anxiety Difficulty Somewhat difficult   Interventions: Nelani was administered the PHQ-9 and GAD-7 for symptom monitoring. Content from the last session was reviewed. Throughout today's session, empathic reflections and validation were provided. Alice Kemp's thoughts and feelings related to her father's passing was briefly processed. Psychoeducation regarding triggers for emotional eating was provided. This provider also recommended grief therapy.   DSM-5 Diagnosis: 296.32 (F33.1) Major Depressive Disorder, Recurrent Episode, Moderate, With Anxious Distress, Moderate   Treatment Goal & Progress: Alice Kemp was seen for  an initial appointment with this provider on August 02, 2018 during which  the following treatment goal was established: decrease emotional eating. Alice Kemp has demonstrated progress in her goal of decreasing emotional eating as evidenced by her increased awareness of emotional versus physical hunger as well as her willingness to identify triggers for emotional eating.   Plan: Samani continues to appear able and willing to participate as evidenced by engagement in reciprocal conversation, and asking questions for clarification as appropriate. Due to the upcoming Thanksgiving holiday and this provider being out of the office in the coming weeks, the next appointment will be scheduled in two to three weeks. The aforementioned was explained to [place], and she verbally acknowledged understanding. The next session will focus on reviewing triggers for emotional eating, and working towards the established treatment goal.

## 2018-08-16 ENCOUNTER — Ambulatory Visit (INDEPENDENT_AMBULATORY_CARE_PROVIDER_SITE_OTHER): Payer: PPO | Admitting: Psychology

## 2018-08-16 ENCOUNTER — Ambulatory Visit (INDEPENDENT_AMBULATORY_CARE_PROVIDER_SITE_OTHER): Payer: PPO | Admitting: Family Medicine

## 2018-08-16 VITALS — BP 113/71 | HR 74 | Temp 98.0°F | Ht 63.0 in | Wt 261.0 lb

## 2018-08-16 DIAGNOSIS — F3289 Other specified depressive episodes: Secondary | ICD-10-CM

## 2018-08-16 DIAGNOSIS — F331 Major depressive disorder, recurrent, moderate: Secondary | ICD-10-CM

## 2018-08-16 DIAGNOSIS — Z6841 Body Mass Index (BMI) 40.0 and over, adult: Secondary | ICD-10-CM

## 2018-08-17 ENCOUNTER — Other Ambulatory Visit: Payer: Self-pay

## 2018-08-17 NOTE — Patient Outreach (Signed)
[facility]) Care Management  08/17/2018  Alice Kemp [birth year] [phone]   Telephone call to member who is in the diabetes quality program.  Informed member that she will be transitioned to another Health Coach for diabetes disease management and to expect an outreach from that nurse. Member agreeable to continue to be followed by [facility] for diabetes disease management. Plan to refer member to Health Coach for diabetes disease management. Peter Garter RN, Jackquline Denmark, CDE Care Management Coordinator [facility] Care Management [phone]

## 2018-08-17 NOTE — Progress Notes (Signed)
Office: [phone]  /  Fax: [phone]   HPI:   Chief Complaint: OBESITY Alice Kemp is here to discuss her progress with her obesity treatment plan. She is on the Category 3 plan and is following her eating plan approximately 40 % of the time. She states she is doing more exercise with two new dogs 7 times per week. Alice Kemp is doing very well with weight loss, but part of her weight loss is due to skipping meals, due to worsening depression. Her weight is 261 lb (118.4 kg) today and has had a weight loss of 12 pounds over a period of 4 weeks since her last visit. She has lost 39 lbs since starting treatment with [name].  Depression with emotional eating behaviors Alice Kemp is seeing Dr. Mallie Mussel to help with depression and emotional eating. She is currently taking Wellbutrin and Cymbalta and she is working on recognizing emotional eating. Alice Kemp struggles with emotional eating and using food for comfort to the extent that it is negatively impacting her health. She often snacks when she is not hungry. Alice Kemp sometimes feels she is out of control and then feels guilty that she made poor food choices. She has been working on behavior modification techniques to help reduce her emotional eating and has been somewhat successful. She shows no sign of suicidal or homicidal ideations.  Depression screen Alice Kemp 2/9 08/16/2018 08/02/2018 07/26/2018 06/21/2018 05/27/2018  Decreased Interest 2 3 0 1 1  Down, Depressed, Hopeless 2 2 1  0 1  PHQ - 2 Score 4 5 1 1 2   Altered sleeping 2 2 - - 1  Tired, decreased energy 2 3 - - 1  Change in appetite 2 2 - - 0  Feeling bad or failure about yourself  1 2 - - 0  Trouble concentrating 2 2 - - 0  Moving slowly or fidgety/restless 0 0 - - 0  Suicidal thoughts 0 0 - - 0  PHQ-9 Score 13 16 - - 4  Difficult doing work/chores - - - - -  Some recent data might be hidden      ALLERGIES: Allergies  Allergen Reactions  . Sulfa Antibiotics   . Penicillins Rash     MEDICATIONS: Current Outpatient Medications on File Prior to Visit  Medication Sig Dispense Refill  . allopurinol (ZYLOPRIM) 100 MG tablet TAKE 1 TABLET BY MOUTH ONCE DAILY 90 tablet 1  . Alpha-Lipoic Acid 100 MG CAPS Take by mouth.    Marland Kitchen amitriptyline (ELAVIL) 10 MG tablet at bedtime as needed.   5  . aspirin 81 MG tablet Take 81 mg by mouth daily. Reported on 12/24/2015    . buPROPion (WELLBUTRIN XL) 300 MG 24 hr tablet Take 1 tablet (300 mg total) by mouth daily. 90 tablet 1  . celecoxib (CELEBREX) 100 MG capsule Take 1 capsule (100 mg total) by mouth daily as needed. Yearly physical w/labs due in August must see MD for future refills 90 capsule 0  . Coenzyme Q10 (EQL COQ10) 300 MG CAPS Take 1 capsule (300 mg total) daily at 12 noon by mouth. 30 capsule 0  . colchicine 0.6 MG tablet TAKE 2 TABLETS BY MOUTH AT ONSET OF FLARE THEN TAKE 1 TABLET 1 HOUR LATER 15 tablet 0  . DULoxetine (CYMBALTA) 30 MG capsule Take 1 capsule (30 mg total) by mouth 2 (two) times daily. 180 capsule 1  . fluticasone (CUTIVATE) 0.05 % cream APPLY 3 TIMES A DAY AS NEEDED FOR ITCHING 60 g 1  . furosemide (  LASIX) 80 MG tablet Take 1 tablet (80 mg total) by mouth daily. 90 tablet 3  . gabapentin (NEURONTIN) 300 MG capsule 300 mg 3 (three) times daily.     Marland Kitchen levocetirizine (XYZAL) 5 MG tablet TAKE 1 TABLET BY MOUTH EVERY EVENING 90 tablet 3  . levothyroxine (SYNTHROID, LEVOTHROID) 88 MCG tablet Take 88 mcg by mouth daily before breakfast.    . losartan (COZAAR) 25 MG tablet Take 1 tablet (25 mg total) by mouth daily. -- Office visit needed for further refills 90 tablet 0  . Magnesium 100 MG CAPS Take 2 capsules by mouth daily.    Marland Kitchen NOVOLOG MIX 70/30 (70-30) 100 UNIT/ML injection Inject into the skin. 85 units breakfast , 55 units lunch and dinner    . ONETOUCH VERIO test strip   3  . rosuvastatin (CRESTOR) 5 MG tablet Take 1 tablet (5 mg total) by mouth daily. 90 tablet 3  . spironolactone (ALDACTONE) 25 MG tablet  TAKE 1 TABLET (25 MG TOTAL) BY MOUTH DAILY. 90 tablet 1  . traMADol (ULTRAM) 50 MG tablet Take by mouth every 12 (twelve) hours as needed.    . vitamin B-12 (CYANOCOBALAMIN) 500 MCG tablet Take 500 mcg by mouth 2 (two) times daily.    . Vitamin D, Ergocalciferol, (DRISDOL) 50000 units CAPS capsule Take 1 capsule (50,000 Units total) by mouth every 3 (three) days. 15 capsule 1   No current facility-administered medications on file prior to visit.     PAST MEDICAL HISTORY: Past Medical History:  Diagnosis Date  . Anxiety   . Arthritis   . Back pain   . Benign neoplasm of colon 06/29/2012  . Breast abscess 07/2011   left; s/p I&D + abx by gen surg  . Cervical radiculopathy at C6   . CKD (chronic kidney disease) stage 3, GFR 30-59 ml/min (HCC)    follows with renal  . Constipation   . Depression   . Diabetes mellitus type II   . Diastolic heart failure ([place]) 09/2013 echo  . Gall bladder inflammation   . Glaucoma   . Gout   . Hyperlipidemia   . Hypothyroidism   . Joint pain   . Kidney disease   . Leg pain   . Morbid obesity ([place])   . OBSTRUCTIVE SLEEP APNEA 12/23/2010   npsg 2012:  AHI 25/hr, severe desat to 68%.   autoset 2013: optimal pressure 13cm   . PERICARDIAL EFFUSION 12/12/2010  . Pulmonary hypertension ([place])   . Shortness of breath   . Skin lesion of breast 07/09/2012  . Swelling of extremity   . Unspecified hypothyroidism   . Vitamin B12 deficiency   . Vitamin D deficiency     PAST SURGICAL HISTORY: Past Surgical History:  Procedure Laterality Date  . BREAST BIOPSY  1990   Left  . CATARACT EXTRACTION    . COLONOSCOPY  06/29/2012   Procedure: COLONOSCOPY;  Surgeon: Ladene Artist, MD,FACG;  Location: [facility] ENDOSCOPY;  Service: Endoscopy;  Laterality: N/A;  . SHOULDER SURGERY  2010   Left  . TONSILLECTOMY  1964    SOCIAL HISTORY: Social History   Tobacco Use  . Smoking status: Never Smoker  . Smokeless tobacco: Never Used  Substance Use Topics  . Alcohol  use: No    Alcohol/week: 0.0 standard drinks    Frequency: Never  . Drug use: No    FAMILY HISTORY: Family History  Problem Relation Age of Onset  . Hypertension Mother   .  Heart disease Mother   . Heart attack Mother   . Thyroid disease Mother   . Diabetes Father   . Heart disease Father   . Skin cancer Father   . Cancer Father        skin  . Heart attack Father   . Hyperlipidemia Father   . Colon polyps Father   . Depression Father   . Hypertension Brother   . Skin cancer Paternal Grandfather   . Colon cancer Paternal Grandfather 57  . Breast cancer Paternal Aunt   . Cancer Paternal Aunt        breast  . Diabetes Paternal Uncle   . Diabetes Maternal Grandfather   . Sudden death Neg Hx     ROS: Review of Systems  Constitutional: Positive for weight loss.  Psychiatric/Behavioral: Positive for depression. Negative for suicidal ideas.    PHYSICAL EXAM: Blood pressure 113/71, pulse 74, temperature 98 F (36.7 C), temperature source Oral, height 5\' 3"  (1.6 m), weight 261 lb (118.4 kg), SpO2 96 %. Body mass index is 46.23 kg/m. Physical Exam  Constitutional: She is oriented to person, place, and time. She appears well-developed and well-nourished.  Cardiovascular: Normal rate.  Pulmonary/Chest: Effort normal.  Musculoskeletal: Normal range of motion.  Neurological: She is oriented to person, place, and time.  Skin: Skin is warm and dry.  Psychiatric: Her behavior is normal. She exhibits a depressed mood. She expresses no homicidal and no suicidal ideation.  Vitals reviewed.   RECENT LABS AND TESTS: BMET    Component Value Date/Time   NA 140 05/17/2018 1128   K 4.6 05/17/2018 1128   CL 100 05/17/2018 1128   CO2 21 05/17/2018 1128   GLUCOSE 217 (H) 05/17/2018 1128   GLUCOSE 130 (H) 01/08/2016 1649   BUN 32 (H) 05/17/2018 1128   CREATININE 1.89 (H) 05/17/2018 1128   CALCIUM 8.9 05/17/2018 1128   GFRNONAA 27 (L) 05/17/2018 1128   GFRAA 31 (L) 05/17/2018  1128   Lab Results  Component Value Date   HGBA1C 9.9 (H) 05/17/2018   HGBA1C 8.8 (H) 12/17/2017   HGBA1C 7.9 (H) 07/30/2017   HGBA1C 8.1 (H) 04/10/2015   HGBA1C 10.0 (H) 04/11/2014   Lab Results  Component Value Date   INSULIN 9.8 05/17/2018   INSULIN 31.9 (H) 12/17/2017   INSULIN 37.2 (H) 07/30/2017   CBC    Component Value Date/Time   WBC 11.0 (H) 05/17/2018 1128   WBC 8.9 09/23/2013 1149   RBC 4.27 05/17/2018 1128   RBC 4.58 09/23/2013 1149   HGB 13.0 05/17/2018 1128   HCT 41.1 05/17/2018 1128   PLT 213 12/15/2016   MCV 96 05/17/2018 1128   MCH 30.4 05/17/2018 1128   MCH 28.1 01/29/2011 0500   MCHC 31.6 05/17/2018 1128   MCHC 33.0 09/23/2013 1149   RDW 14.8 05/17/2018 1128   LYMPHSABS 4.1 (H) 05/17/2018 1128   MONOABS 0.6 09/23/2013 1149   EOSABS 0.2 05/17/2018 1128   BASOSABS 0.0 05/17/2018 1128   Iron/TIBC/Ferritin/ %Sat No results found for: IRON, TIBC, FERRITIN, IRONPCTSAT Lipid Panel     Component Value Date/Time   CHOL 190 05/17/2018 1128   TRIG 199 (H) 05/17/2018 1128   HDL 35 (L) 05/17/2018 1128   CHOLHDL 4 04/10/2015 1212   VLDL 38.8 04/10/2015 1212   LDLCALC 115 (H) 05/17/2018 1128   LDLDIRECT 54.6 05/10/2012 1440   Hepatic Function Panel     Component Value Date/Time   PROT 6.9 05/17/2018 1128  ALBUMIN 3.8 05/17/2018 1128   AST 11 05/17/2018 1128   ALT 9 05/17/2018 1128   ALKPHOS 76 05/17/2018 1128   BILITOT 0.4 05/17/2018 1128   BILIDIR 0.1 05/10/2012 1440      Component Value Date/Time   TSH 2.610 07/30/2017 1020   TSH 1.62 04/10/2015 1212   TSH 1.65 09/23/2013 1149   Results for Lasch, GARI TROVATO (MRN [id number]) as of 08/17/2018 08:15  Ref. Range 05/17/2018 11:28  Vitamin D, 25-Hydroxy Latest Ref Range: 30.0 - 100.0 ng/mL 23.6 (L)   ASSESSMENT AND PLAN: Other depression - with emotional eating  Class 3 severe obesity with serious comorbidity and body mass index (BMI) of 45.0 to 49.9 in adult, unspecified obesity type  ([place])  PLAN:  Depression with Emotional Eating Behaviors We discussed behavior modification techniques today to help Alice Kemp deal with her emotional eating and depression. She will continue her medications and therapy and follow up as directed. Alice Kemp was encouraged to avoid skipping meals.  I spent > than 50% of the 25 minute visit on counseling as documented in the note.  Obesity Alice Kemp is currently in the action stage of change. As such, her goal is to continue with weight loss efforts She has agreed to follow the Category 3 plan Alice Kemp has been instructed to work up to a goal of 150 minutes of combined cardio and strengthening exercise per week for weight loss and overall health benefits. We discussed the following Behavioral Modification Strategies today: no skipping meals, holiday eating strategies and emotional eating strategies  Alice Kemp has agreed to follow up with our clinic in 3 weeks. She was informed of the importance of frequent follow up visits to maximize her success with intensive lifestyle modifications for her multiple health conditions.   OBESITY BEHAVIORAL INTERVENTION VISIT  Today's visit was # 16   Starting weight: 300 lbs Starting date: 07/29/2017 Today's weight : 261 lbs Today's date: 08/16/2018 Total lbs lost to date: 29   ASK: We discussed the diagnosis of obesity with Alice Kemp today and Alice Kemp agreed to give [name] permission to discuss obesity behavioral modification therapy today.  ASSESS: Alice Kemp has the diagnosis of obesity and her BMI today is 46.25 Alice Kemp is in the action stage of change   ADVISE: Alice Kemp was educated on the multiple health risks of obesity as well as the benefit of weight loss to improve her health. She was advised of the need for long term treatment and the importance of lifestyle modifications to improve her current health and to decrease her risk of future health problems.  AGREE: Multiple dietary modification options and  treatment options were discussed and  Alice Kemp agreed to follow the recommendations documented in the above note.  ARRANGE: Alice Kemp was educated on the importance of frequent visits to treat obesity as outlined per CMS and USPSTF guidelines and agreed to schedule her next follow up appointment today.  I, Doreene Nest, am acting as transcriptionist for Dennard Nip, MD  I have reviewed the above documentation for accuracy and completeness, and I agree with the above. -Dennard Nip, MD

## 2018-08-19 DIAGNOSIS — E11319 Type 2 diabetes mellitus with unspecified diabetic retinopathy without macular edema: Secondary | ICD-10-CM | POA: Diagnosis not present

## 2018-08-19 DIAGNOSIS — E7800 Pure hypercholesterolemia, unspecified: Secondary | ICD-10-CM | POA: Diagnosis not present

## 2018-08-19 DIAGNOSIS — E039 Hypothyroidism, unspecified: Secondary | ICD-10-CM | POA: Diagnosis not present

## 2018-08-19 DIAGNOSIS — E1140 Type 2 diabetes mellitus with diabetic neuropathy, unspecified: Secondary | ICD-10-CM | POA: Diagnosis not present

## 2018-08-19 DIAGNOSIS — I10 Essential (primary) hypertension: Secondary | ICD-10-CM | POA: Diagnosis not present

## 2018-08-19 DIAGNOSIS — R809 Proteinuria, unspecified: Secondary | ICD-10-CM | POA: Diagnosis not present

## 2018-08-19 DIAGNOSIS — E1165 Type 2 diabetes mellitus with hyperglycemia: Secondary | ICD-10-CM | POA: Diagnosis not present

## 2018-08-19 DIAGNOSIS — N189 Chronic kidney disease, unspecified: Secondary | ICD-10-CM | POA: Diagnosis not present

## 2018-08-27 ENCOUNTER — Encounter: Payer: Self-pay | Admitting: *Deleted

## 2018-09-06 ENCOUNTER — Ambulatory Visit: Payer: PPO

## 2018-09-08 NOTE — Progress Notes (Signed)
Office: [phone]  /  Fax: [phone]   Date: September 09, 2018 Time Seen: 11:55am Duration: 30 minutes Provider: Glennie Isle, Psy.D. Type of Session: Individual Therapy   HPI: Alice Alice Kemp referred by Dr. Lillia Alice Kemp to depression with emotional eating behaviors. She was seen for an initial appointment by this provider on August 02, 2018. Per the note for the visit withDr. Desmond Alice Kemp July 19, 2018, "Alice Alice Kemp on wellbutrin and cymbalta and her mood has been stable; but she has been feeling discouraged and she notes increased emotional eating. Alice Alice Kemp struggleswith emotional eating and using food for comfort to the extent that it is negatively impacting herhealth. Sheoften snacks when sheis not hungry. Alice Alice Kemp feels sheis out of control and then feels guilty that shemade poor food choices. Alice Alice Kemp been working on behavior modification techniques to help reduce heremotional eating and has been somewhat successful.Sheshows no sign of suicidal or homicidal ideations." In addition, per the note for the initial visit withDr. Desmond Alice Kemp July 29, 2017,Alice Alice Kemp has been heavy most of her life and she started gaining weight around 2010 to2013. Her heaviest weight ever was 309 pounds. During the initial appointment with Dr. Leafy Ro, Alice Alice Kemp also reported experiencing the following:significant food cravings issues; snacking frequently in the evenings; skipping meals frequently; frequently drinking liquids with calories; frequently eating larger portions than normal; and struggling with emotional eating.Alice Alice Kemp's Food and Mood (modified PHQ-9) score was22 for the initial appointment with Dr. Leafy Ro. During the initial appointment with this provider, Alice Alice Kemp reported a fluctuation in her weight loss. Currently, she follows the prescribed meal plan 40% of the time and discussed ongoing stressors. More specifically, when her past father passed away in [id number], Alice Alice Kemp  took a break from the program with this clinicfromMay until August. She noted the last time she engaged in emotional andbingeeating was early October. In addition, Alice Alice Kemp shared that she typically skips breakfast and her first meal of the day is usually after 12 PM. Alice Kemp reported engaging in emotional and binge eating all her life. She denied a history of engagement in purgingandother compensatory strategies. She also denied ever being diagnosed with an eating disorder. When Alice Kemp about what binge episode looks like for Alice Alice Kemp, she reported consuming a bag of fun size candy bars. Furthermore, Alice Alice Kemp to complete a questionnaire assessing various behaviors related to emotional eating. Alice Alice Kemp the following: overeat when you are celebrating, experience food cravings on a regular basis, eat certain foods when you are anxious, stressed, depressed, or your feelings are hurt, use food to help you cope with emotional situations, find food is comforting to you, overeat when you are angry or upset, overeat when you are worried about something, overeat frequently when you are bored or lonely, overeat when you are alone, but eat much less when you are with other people and eat as a reward.  Session Content: Session focused on the following treatment goal: decrease emotional eating. The session was initiated with the administration of the PHQ-9 and GAD-7, as well as a brief check-in. Alice Alice Kemp reported she lost three pounds, and she described following the behavioral strategies discussed during Thanksgiving. Regarding emotional eating, Alice Alice Kemp shared stress tends to be the common trigger, and it resulted in one episode of emotional eating since the last appointment with this provider. Additionally, [name] reported sleeping difficulties. Thus, psychoeducation regarding sleep hygiene was provided, and Alice Alice Kemp was given a handout. Additionally, psychoeducation regarding the connection between thoughts,  feelings, and behaviors was provided. Alice Alice Kemp was provided a handout explaining the aforementioned. Moreover, psychoeducation  regarding pleasurable activities, including its impact on emotional eating was provided. Alice Alice Kemp was provided with a handout with various options of pleasurable activities, and was encouraged to engage in different activities between now and the next appointment with this provider. Alice Alice Kemp agreed. Alice Alice Kemp was receptive to today's session as evidenced by openness to sharing, responsiveness to feedback, and willingness to implement sleep hygiene strategies and engage in pleasurable activities.   Mental Status Examination: Alice Alice Kemp arrived early for the appointment; therefore, the appointment was initiated early. She presented as appropriately dressed and groomed. Alice Alice Kemp appeared her stated age and demonstrated adequate orientation to time, place, person, and purpose of the appointment. She also demonstrated appropriate eye contact. No psychomotor abnormalities or behavioral peculiarities noted. Her mood was euthymic with congruent affect. Her thought processes were logical, linear, and goal-directed. No hallucinations, delusions, bizarre thinking or behavior reported or observed. Judgment, insight, and impulse control appeared to be grossly intact. There was no evidence of paraphasias (i.e., errors in speech, gross mispronunciations, and word substitutions), repetition deficits, or disturbances in volume or prosody (i.e., rhythm and intonation). There was no evidence of attention or memory impairments. Alice Alice Kemp denied current suicidal and homicidal ideation, intent or plan.  Structured Assessment Results: The Patient Health Questionnaire-9 (PHQ-9) is a self-report measure that assesses symptoms and severity of depression over the course of the last two weeks. Alice Alice Kemp obtained a score of 12 suggesting moderate depression. Alice Alice Kemp finds the endorsed symptoms to be somewhat difficult. Depression screen  PHQ 2/9 09/09/2018  Decreased Interest 1  Down, Depressed, Hopeless 2  PHQ - 2 Score 3  Altered sleeping 1  Tired, decreased energy 2  Change in appetite 2  Feeling bad or failure about yourself  2  Trouble concentrating 2  Moving slowly or fidgety/restless 0  Suicidal thoughts 0  PHQ-9 Score 12  Difficult doing work/chores -  Some recent data might be hidden   The Generalized Anxiety Disorder-7 (GAD-7) is a brief self-report measure that assesses symptoms of anxiety over the course of the last two weeks. Alice Alice Kemp obtained a score of 4 suggesting minimal anxiety. GAD 7 : Generalized Anxiety Score 09/09/2018  Nervous, Anxious, on Edge 1  Control/stop worrying 1  Worry too much - different things 1  Trouble relaxing 1  Restless 0  Easily annoyed or irritable 0  Afraid - awful might happen 0  Total GAD 7 Score 4  Anxiety Difficulty Somewhat difficult   Interventions: Alice Alice Kemp was administered the PHQ-9 and GAD-7 for symptom monitoring. Content from the last session was reviewed. Throughout today's session, empathic reflections and validation were provided. Psychoeducation regarding sleep hygiene; the connection between thoughts, feelings, and behaviors; and pleasurable activities was provided.  DSM-5 Diagnosis: 296.32 (F33.1) Major Depressive Disorder, Recurrent Episode, Moderate, With Anxious Distress, Moderate  Treatment Goal & Progress: Alice Alice Kemp was seen for an initial appointment with this provider on August 02, 2018 during which the following treatment goal was established: decrease emotional eating. Alice Alice Kemp has demonstrated progress in her goal as evidenced by increased awareness in hunger patterns and triggers for emotional eating. Alice Alice Kemp also noted a reduction in emotional eating episodes. Additionally, she demonstrates willingness to engage in learned skills.   Plan: Missie continues to appear able and willing to participate as evidenced by engagement in reciprocal conversation, and  asking questions for clarification as appropriate. The next appointment will be scheduled in two to four weeks due to concerns related to insurance and the upcoming holidays. The next session will focus on reviewing pleasurable activities,  and working towards the established treatment goal.

## 2018-09-09 ENCOUNTER — Ambulatory Visit (INDEPENDENT_AMBULATORY_CARE_PROVIDER_SITE_OTHER): Payer: PPO | Admitting: Psychology

## 2018-09-09 ENCOUNTER — Ambulatory Visit (INDEPENDENT_AMBULATORY_CARE_PROVIDER_SITE_OTHER): Payer: PPO | Admitting: Family Medicine

## 2018-09-09 VITALS — BP 119/72 | HR 73 | Ht 63.0 in | Wt 258.0 lb

## 2018-09-09 DIAGNOSIS — E559 Vitamin D deficiency, unspecified: Secondary | ICD-10-CM | POA: Diagnosis not present

## 2018-09-09 DIAGNOSIS — F331 Major depressive disorder, recurrent, moderate: Secondary | ICD-10-CM | POA: Diagnosis not present

## 2018-09-09 DIAGNOSIS — Z6841 Body Mass Index (BMI) 40.0 and over, adult: Secondary | ICD-10-CM

## 2018-09-09 MED ORDER — VITAMIN D (ERGOCALCIFEROL) 1.25 MG (50000 UNIT) PO CAPS: 50000.0000 [IU] | ORAL_CAPSULE | ORAL | 0 refills | Status: DC

## 2018-09-13 ENCOUNTER — Other Ambulatory Visit: Payer: Self-pay | Admitting: *Deleted

## 2018-09-13 NOTE — Patient Outreach (Signed)
[facility]) Care Management  09/13/2018  Alice Kemp 1950-11-18 [id number]   RN Health Coach Quarterly Outreach  Referral Date:  02/03/2018 Referral Source:  Chronic Condition Improvement Program Reason for Referral:  Disease Management Education Insurance:  [employer]   Outreach Attempt:  Successful telephone outreach to patient for quarterly follow up.  HIPAA verified with patient.  RN Health Coach introduced self and role.  Patient very reluctant to agree consent to speak with Health Coach.  Discussed Disease Management Quarterly Calls. Patient stating she has not monitored her blood sugars in a couple of days, although Dr. Leafy Kemp requesting monitoring twice a day.  States she is seeing a Teacher, music at the  [employer] and they are working on weight loss and not her depression.  In middle of conversation concerning monitoring blood sugars and medication compliance, patient states "lets end this now, no one is able to help me with medications and I do not want to participate in conversations any longer."  [place] offered assistance with guidance in monitoring blood sugars and support for depression and medication compliance.  Patient currently declining [facility] services at this time.  Plan:  RN Health Coach will reach out to patient's prior [place] Management Coordinator Alice Kemp to see if she can outreach to patient for previous established relationship prior to case closure.   [place] [phone] [email or web address]

## 2018-09-13 NOTE — Progress Notes (Signed)
Office: [phone]  /  Fax: [phone]   HPI:   Chief Complaint: OBESITY Alice Kemp is here to discuss her progress with her obesity treatment plan. She is on the modified Category 3 plan and is following her eating plan approximately 50 % of the time. She states she is exercising 0 minutes 0 times per week. Alice Kemp did well with weight loss even over Thanksgiving. She did well with portion control and smarter choices and has gotten back on track with her Category 3 plan.  Her weight is 258 lb (117 kg) today and has had a weight loss of 3 pounds over a period of 4 weeks since her last visit. She has lost 42 lbs since starting treatment with [name].  Vitamin D deficiency Alice Kemp has a diagnosis of vitamin D deficiency. She is currently taking vit D and is stable, but not yet at goal. She denies nausea, vomiting, or muscle weakness.  ALLERGIES: Allergies  Allergen Reactions  . Sulfa Antibiotics   . Penicillins Rash    MEDICATIONS: Current Outpatient Medications on File Prior to Visit  Medication Sig Dispense Refill  . allopurinol (ZYLOPRIM) 100 MG tablet TAKE 1 TABLET BY MOUTH ONCE DAILY 90 tablet 1  . Alpha-Lipoic Acid 100 MG CAPS Take by mouth.    Marland Kitchen amitriptyline (ELAVIL) 10 MG tablet at bedtime as needed.   5  . aspirin 81 MG tablet Take 81 mg by mouth daily. Reported on 12/24/2015    . buPROPion (WELLBUTRIN XL) 300 MG 24 hr tablet Take 1 tablet (300 mg total) by mouth daily. 90 tablet 1  . celecoxib (CELEBREX) 100 MG capsule Take 1 capsule (100 mg total) by mouth daily as needed. Yearly physical w/labs due in August must see MD for future refills 90 capsule 0  . Coenzyme Q10 (EQL COQ10) 300 MG CAPS Take 1 capsule (300 mg total) daily at 12 noon by mouth. 30 capsule 0  . colchicine 0.6 MG tablet TAKE 2 TABLETS BY MOUTH AT ONSET OF FLARE THEN TAKE 1 TABLET 1 HOUR LATER 15 tablet 0  . DULoxetine (CYMBALTA) 30 MG capsule Take 1 capsule (30 mg total) by mouth 2 (two) times daily. 180 capsule  1  . fluticasone (CUTIVATE) 0.05 % cream APPLY 3 TIMES A DAY AS NEEDED FOR ITCHING 60 g 1  . furosemide (LASIX) 80 MG tablet Take 1 tablet (80 mg total) by mouth daily. 90 tablet 3  . gabapentin (NEURONTIN) 300 MG capsule 300 mg 3 (three) times daily.     Marland Kitchen levocetirizine (XYZAL) 5 MG tablet TAKE 1 TABLET BY MOUTH EVERY EVENING 90 tablet 3  . levothyroxine (SYNTHROID, LEVOTHROID) 88 MCG tablet Take 88 mcg by mouth daily before breakfast.    . losartan (COZAAR) 25 MG tablet Take 1 tablet (25 mg total) by mouth daily. -- Office visit needed for further refills 90 tablet 0  . Magnesium 100 MG CAPS Take 2 capsules by mouth daily.    Marland Kitchen NOVOLOG MIX 70/30 (70-30) 100 UNIT/ML injection Inject into the skin. 85 units breakfast , 55 units lunch and dinner    . ONETOUCH VERIO test strip   3  . rosuvastatin (CRESTOR) 5 MG tablet Take 1 tablet (5 mg total) by mouth daily. 90 tablet 3  . spironolactone (ALDACTONE) 25 MG tablet TAKE 1 TABLET (25 MG TOTAL) BY MOUTH DAILY. 90 tablet 1  . traMADol (ULTRAM) 50 MG tablet Take by mouth every 12 (twelve) hours as needed.    . vitamin B-12 (  CYANOCOBALAMIN) 500 MCG tablet Take 500 mcg by mouth 2 (two) times daily.     No current facility-administered medications on file prior to visit.     PAST MEDICAL HISTORY: Past Medical History:  Diagnosis Date  . Anxiety   . Arthritis   . Back pain   . Benign neoplasm of colon 06/29/2012  . Breast abscess 07/2011   left; s/p I&D + abx by gen surg  . Cervical radiculopathy at C6   . CKD (chronic kidney disease) stage 3, GFR 30-59 ml/min (HCC)    follows with renal  . Constipation   . Depression   . Diabetes mellitus type II   . Diastolic heart failure ([place]) 09/2013 echo  . Gall bladder inflammation   . Glaucoma   . Gout   . Hyperlipidemia   . Hypothyroidism   . Joint pain   . Kidney disease   . Leg pain   . Morbid obesity ([place])   . OBSTRUCTIVE SLEEP APNEA 12/23/2010   npsg 2012:  AHI 25/hr, severe desat to 68%.    autoset 2013: optimal pressure 13cm   . PERICARDIAL EFFUSION 12/12/2010  . Pulmonary hypertension ([place])   . Shortness of breath   . Skin lesion of breast 07/09/2012  . Swelling of extremity   . Unspecified hypothyroidism   . Vitamin B12 deficiency   . Vitamin D deficiency     PAST SURGICAL HISTORY: Past Surgical History:  Procedure Laterality Date  . BREAST BIOPSY  1990   Left  . CATARACT EXTRACTION    . COLONOSCOPY  06/29/2012   Procedure: COLONOSCOPY;  Surgeon: Ladene Artist, MD,FACG;  Location: [facility] ENDOSCOPY;  Service: Endoscopy;  Laterality: N/A;  . SHOULDER SURGERY  2010   Left  . TONSILLECTOMY  1964    SOCIAL HISTORY: Social History   Tobacco Use  . Smoking status: Never Smoker  . Smokeless tobacco: Never Used  Substance Use Topics  . Alcohol use: No    Alcohol/week: 0.0 standard drinks    Frequency: Never  . Drug use: No    FAMILY HISTORY: Family History  Problem Relation Age of Onset  . Hypertension Mother   . Heart disease Mother   . Heart attack Mother   . Thyroid disease Mother   . Diabetes Father   . Heart disease Father   . Skin cancer Father   . Cancer Father        skin  . Heart attack Father   . Hyperlipidemia Father   . Colon polyps Father   . Depression Father   . Hypertension Brother   . Skin cancer Paternal Grandfather   . Colon cancer Paternal Grandfather 66  . Breast cancer Paternal Aunt   . Cancer Paternal Aunt        breast  . Diabetes Paternal Uncle   . Diabetes Maternal Grandfather   . Sudden death Neg Hx     ROS: Review of Systems  Constitutional: Positive for weight loss.  Gastrointestinal: Negative for vomiting.  Musculoskeletal:       Negative for muscle weakness.    PHYSICAL EXAM: Blood pressure 119/72, pulse 73, height 5\' 3"  (1.6 m), weight 258 lb (117 kg), SpO2 98 %. Body mass index is 45.7 kg/m. Physical Exam  Constitutional: She is oriented to person, place, and time. She appears well-developed and  well-nourished.  Cardiovascular: Normal rate.  Pulmonary/Chest: Effort normal.  Musculoskeletal: Normal range of motion.  Neurological: She is oriented to person, place, and time.  Skin: Skin is warm and dry.  Psychiatric: She has a normal mood and affect. Her behavior is normal.  Vitals reviewed.   RECENT LABS AND TESTS: BMET    Component Value Date/Time   NA 140 05/17/2018 1128   K 4.6 05/17/2018 1128   CL 100 05/17/2018 1128   CO2 21 05/17/2018 1128   GLUCOSE 217 (H) 05/17/2018 1128   GLUCOSE 130 (H) 01/08/2016 1649   BUN 32 (H) 05/17/2018 1128   CREATININE 1.89 (H) 05/17/2018 1128   CALCIUM 8.9 05/17/2018 1128   GFRNONAA 27 (L) 05/17/2018 1128   GFRAA 31 (L) 05/17/2018 1128   Lab Results  Component Value Date   HGBA1C 9.9 (H) 05/17/2018   HGBA1C 8.8 (H) 12/17/2017   HGBA1C 7.9 (H) 07/30/2017   HGBA1C 8.1 (H) 04/10/2015   HGBA1C 10.0 (H) 04/11/2014   Lab Results  Component Value Date   INSULIN 9.8 05/17/2018   INSULIN 31.9 (H) 12/17/2017   INSULIN 37.2 (H) 07/30/2017   CBC    Component Value Date/Time   WBC 11.0 (H) 05/17/2018 1128   WBC 8.9 09/23/2013 1149   RBC 4.27 05/17/2018 1128   RBC 4.58 09/23/2013 1149   HGB 13.0 05/17/2018 1128   HCT 41.1 05/17/2018 1128   PLT 213 12/15/2016   MCV 96 05/17/2018 1128   MCH 30.4 05/17/2018 1128   MCH 28.1 01/29/2011 0500   MCHC 31.6 05/17/2018 1128   MCHC 33.0 09/23/2013 1149   RDW 14.8 05/17/2018 1128   LYMPHSABS 4.1 (H) 05/17/2018 1128   MONOABS 0.6 09/23/2013 1149   EOSABS 0.2 05/17/2018 1128   BASOSABS 0.0 05/17/2018 1128   Iron/TIBC/Ferritin/ %Sat No results found for: IRON, TIBC, FERRITIN, IRONPCTSAT Lipid Panel     Component Value Date/Time   CHOL 190 05/17/2018 1128   TRIG 199 (H) 05/17/2018 1128   HDL 35 (L) 05/17/2018 1128   CHOLHDL 4 04/10/2015 1212   VLDL 38.8 04/10/2015 1212   LDLCALC 115 (H) 05/17/2018 1128   LDLDIRECT 54.6 05/10/2012 1440   Hepatic Function Panel     Component Value  Date/Time   PROT 6.9 05/17/2018 1128   ALBUMIN 3.8 05/17/2018 1128   AST 11 05/17/2018 1128   ALT 9 05/17/2018 1128   ALKPHOS 76 05/17/2018 1128   BILITOT 0.4 05/17/2018 1128   BILIDIR 0.1 05/10/2012 1440      Component Value Date/Time   TSH 2.610 07/30/2017 1020   TSH 1.62 04/10/2015 1212   TSH 1.65 09/23/2013 1149   Results for Procida, SHELLE GALDAMEZ (MRN [id number]) as of 09/13/2018 15:41  Ref. Range 05/17/2018 11:28  Vitamin D, 25-Hydroxy Latest Ref Range: 30.0 - 100.0 ng/mL 23.6 (L)   ASSESSMENT AND PLAN: Vitamin D deficiency - Plan: Vitamin D, Ergocalciferol, (DRISDOL) 1.25 MG (50000 UT) CAPS capsule  Class 3 severe obesity with serious comorbidity and body mass index (BMI) of 45.0 to 49.9 in adult, unspecified obesity type ([place])  PLAN:  Vitamin D Deficiency Alice Kemp was informed that low vitamin D levels contributes to fatigue and are associated with obesity, breast, and colon cancer. She agrees to continue to take prescription Vit D @50 ,000 IU every week #4 with no refills and will follow up for routine testing of vitamin D, at least 2-3 times per year. She was informed of the risk of over-replacement of vitamin D and agrees to not increase her dose unless she discusses this with [name] first. We will check labs at her next visit and she agrees to follow  up in 2 to 4 weeks.  Obesity Alice Kemp is currently in the action stage of change. As such, her goal is to continue with weight loss efforts. She has agreed to follow the Category 3 plan. Alice Kemp has been instructed to work up to a goal of 150 minutes of combined cardio and strengthening exercise per week for weight loss and overall health benefits. We discussed the following Behavioral Modification Strategies today: increasing lean protein intake, decreasing simple carbohydrates, and holiday eating strategies.   Alice Kemp has agreed to follow up with our clinic in 2 to 4 weeks. She was informed of the importance of frequent follow up visits to  maximize her success with intensive lifestyle modifications for her multiple health conditions.   OBESITY BEHAVIORAL INTERVENTION VISIT  Today's visit was # 17   Starting weight: 300 lbs Starting date: 07/29/17 Today's weight : Weight: 258 lb (117 kg)  Today's date: 09/09/2018 Total lbs lost to date: 42 At least 15 minutes were spent on discussing the following behavioral intervention visit.  ASK: We discussed the diagnosis of obesity with Alice Kemp today and Alice Kemp agreed to give [name] permission to discuss obesity behavioral modification therapy today.  ASSESS: Alice Kemp has the diagnosis of obesity and her BMI today is 45.7. Alice Kemp is in the action stage of change.   ADVISE: Alice Kemp was educated on the multiple health risks of obesity as well as the benefit of weight loss to improve her health. She was advised of the need for long term treatment and the importance of lifestyle modifications to improve her current health and to decrease her risk of future health problems.  AGREE: Multiple dietary modification options and treatment options were discussed and Alice Kemp agreed to follow the recommendations documented in the above note.  ARRANGE: Alice Kemp was educated on the importance of frequent visits to treat obesity as outlined per CMS and USPSTF guidelines and agreed to schedule her next follow up appointment today.  I, Alice Kemp, am acting as transcriptionist for Starlyn Skeans, MD  I have reviewed the above documentation for accuracy and completeness, and I agree with the above. -Dennard Nip, MD

## 2018-09-15 ENCOUNTER — Other Ambulatory Visit: Payer: Self-pay | Admitting: *Deleted

## 2018-09-15 NOTE — Patient Outreach (Signed)
[facility]) Care Management  09/15/2018  Alice Kemp 1950-11-09 [id number]   RN Health Coach Case Closure  Referral Date:  02/03/2018 Referral Source:  Chronic Condition Improvement Program Reason for Referral:  Disease Management Education Insurance:  [employer]age   Outreach Attempt:  Discussed case with previous [name].  Based on patient's insistence of declining [facility] services, case will be closed at this time.  Plan:  RN Health Coach will close case based on patient declining services in recent previous conversation.  RN Health Coach will send primary care provider Case Closure Letter.  RN Health Coach will send patient Case Closure Letter.  [name] [phone] [name]

## 2018-09-22 MED FILL — VIT D2 1.25 MG (50,000 UNIT: 1.25 MG | 30 days supply | Qty: 10 | Fill #0

## 2018-10-13 NOTE — Progress Notes (Signed)
Office: [phone]  /  Fax: [phone]    Date: October 14, 2018 Time Seen: 11:58am Duration: 32 minutes Provider: Glennie Isle, Psy.D. Type of Session: Individual Therapy  Type of Contact: Face-to-face  HPI: Alice Kemp referred by Dr. Lillia Carmel to depression with emotional eating behaviors.She was seen for an initial appointment by this provider on August 02, 2018.Per the note for the visit withDr. Desmond Dike July 19, 2018, "Sandrais on wellbutrin and cymbalta and her mood has been stable; but she has been feeling discouraged and she notes increased emotional eating. Harold struggleswith emotional eating and using food for comfort to the extent that it is negatively impacting herhealth. Sheoften snacks when sheis not hungry. Sandrasometimes feels sheis out of control and then feels guilty that shemade poor food choices. Nunzio Cory been working on behavior modification techniques to help reduce heremotional eating and has been somewhat successful.Sheshows no sign of suicidal or homicidal ideations."In addition, per the note for the initial visit withDr. Desmond Dike July 29, 2017,[name] has been heavy most of her life and she started gaining weight around 2010 to2013. Her heaviest weight ever was 309 pounds. During the initial appointment with Dr. Leafy Ro, Sandraalsoreported experiencing the following:significant food cravings issues; snacking frequently in the evenings; skipping meals frequently; frequently drinking liquids with calories; frequently eating larger portions than normal; and struggling with emotional eating.Tyniah's Food and Mood (modified PHQ-9) score was22 for the initial appointment with Dr. Leafy Ro.Duringthe initial appointment with this provider, [name] reported a fluctuation in her weight loss. Currently, she follows the prescribed meal plan 40% of the time and discussed ongoing stressors. More specifically, when her past father  passed away in [id number], Katti took a break from the program with this clinicfromMay until August. She noted the last time she engaged in emotional andbingeeating was early October. In addition, [name] shared that she typically skips breakfast and her first meal of the day is usually after 12 PM. [name] reported engaging in emotional and binge eating all her life. She denied a history of engagement in purgingandother compensatory strategies. She also denied ever being diagnosed with an eating disorder. When asked about what binge episode looks like for [name], she reported consuming a bag of fun size candy bars. Furthermore,Alice Kemp asked to complete a questionnaire assessing various behaviors related to emotional eating. Sandraendorsed the following: overeat when you are celebrating, experience food cravings on a regular basis, eat certain foods when you are anxious, stressed, depressed, or your feelings are hurt, use food to help you cope with emotional situations, find food is comforting to you, overeat when you are angry or upset, overeat when you are worried about something, overeat frequently when you are bored or lonely, overeat when you are alone, but eat much less when you are with other people and eat as a reward. During today's appointment, [name] reported she lost six pounds.   Session Content: Session focused on the following treatment goal: decrease emotional eating. The session was initiated with the administration of the PHQ-9 and GAD-7, as well as a brief check-in. [name] shared about recent events, including the holidays. During the holidays, [name] shared during one celebration she did not "go back for seconds" and had only one bite of dessert. During other celebrations, she discussed making better choices and utilizing learned behavioral strategies. [name] noted she lost six pounds and denied engaging in emotional eating outside of the holidays. Regarding pleasurable activities,  [name] discussed engaging in activities since the last appointment. She described them having a positive  impact on her mood; therefore, she was encouraged to continue engaging in pleasurable activities. However, [name] disclosed having financial difficulties; therefore, the resource of 211 was provided. Remainder of session focused on mindfulness. Psychoeducation regarding mindfulness was provided. A handout was provided to [name] with further information regarding mindfulness, including exercises. This provider also explained the benefit of mindfulness as it relates to emotional eating. [name] was encouraged to engage in the provided exercises between now and the next appointment with this provider. [name] Look agreed. She also led through an exercise involving her senses. [name] was receptive to today's session as evidenced by openness to sharing, responsiveness to feedback, and engagement in a mindfulness exercise.  Mental Status Examination: [name] arrived early for the appointment. She presented as appropriately dressed and groomed. [name] appeared her stated age and demonstrated adequate orientation to time, place, person, and purpose of the appointment. She also demonstrated appropriate eye contact. No psychomotor abnormalities or behavioral peculiarities noted. Her mood was euthymic with congruent affect. Her thought processes were logical, linear, and goal-directed. No hallucinations, delusions, bizarre thinking or behavior reported or observed. Judgment, insight, and impulse control appeared to be grossly intact. There was no evidence of paraphasias (i.e., errors in speech, gross mispronunciations, and word substitutions), repetition deficits, or disturbances in volume or prosody (i.e., rhythm and intonation). There was no evidence of attention or memory impairments. [name] denied current suicidal and homicidal ideation, intent or plan.  Structured Assessment Results: The Patient Health Questionnaire-9  (PHQ-9) is a self-report measure that assesses symptoms and severity of depression over the course of the last two weeks. [name] obtained a score of 3 suggesting minimal depression. Yulieth finds the endorsed symptoms to be somewhat difficult. Depression screen [facility] 2/9 10/14/2018  Decreased Interest 0  Down, Depressed, Hopeless 0  PHQ - 2 Score 0  Altered sleeping 1  Tired, decreased energy 1  Change in appetite 0  Feeling bad or failure about yourself  1  Trouble concentrating 0  Moving slowly or fidgety/restless 0  Suicidal thoughts 0  PHQ-9 Score 3  Difficult doing work/chores -  Some recent data might be hidden   The Generalized Anxiety Disorder-7 (GAD-7) is a brief self-report measure that assesses symptoms of anxiety over the course of the last two weeks. Amiel obtained a score of 3 suggesting minimal anxiety.  GAD 7 : Generalized Anxiety Score 10/14/2018  Nervous, Anxious, on Edge 1  Control/stop worrying 1  Worry too much - different things 0  Trouble relaxing 0  Restless 0  Easily annoyed or irritable 1  Afraid - awful might happen 0  Total GAD 7 Score 3  Anxiety Difficulty Somewhat difficult   Interventions:  Administration of PHQ-9 and GAD-7 for symptom monitoring Review of content from the previous session Empathic reflections and validation Psychoeducation regarding mindfulness Mindfulness exercise Positive reinforcement Brief chart review  DSM-5 Diagnosis: 296.32 (F33.1) Major Depressive Disorder, Recurrent Episode, Moderate, With Anxious Distress, Moderate  Treatment Goal & Progress: During the initial appointment with this provider, the following treatment goal was established: decrease emotional eating. Tayia has demonstrated progress in her goal as evidenced by increased awareness in hunger patterns and triggers for emotional eating. Stefannie shared engaging in learned skills, and demonstrated willingness to engage in mindfulness. Aside from holiday eating, Rashaun  denied experiencing episodes of emotional eating.   Plan: Jose continues to appear able and willing to participate as evidenced by engagement in reciprocal conversation, and asking questions for clarification as appropriate. Leyda expressed desire to coordinate  appointments within the clinic. Thus, the next appointment will be scheduled in three to four weeks. The next session will focus further on mindfulness and termination planning.

## 2018-10-14 ENCOUNTER — Encounter (INDEPENDENT_AMBULATORY_CARE_PROVIDER_SITE_OTHER): Payer: Self-pay | Admitting: Family Medicine

## 2018-10-14 ENCOUNTER — Ambulatory Visit (INDEPENDENT_AMBULATORY_CARE_PROVIDER_SITE_OTHER): Payer: PPO | Admitting: Family Medicine

## 2018-10-14 ENCOUNTER — Ambulatory Visit (INDEPENDENT_AMBULATORY_CARE_PROVIDER_SITE_OTHER): Payer: PPO | Admitting: Psychology

## 2018-10-14 VITALS — BP 112/75 | HR 82 | Temp 97.8°F | Ht 63.0 in | Wt 252.0 lb

## 2018-10-14 DIAGNOSIS — E559 Vitamin D deficiency, unspecified: Secondary | ICD-10-CM | POA: Diagnosis not present

## 2018-10-14 DIAGNOSIS — F331 Major depressive disorder, recurrent, moderate: Secondary | ICD-10-CM

## 2018-10-14 DIAGNOSIS — Z6841 Body Mass Index (BMI) 40.0 and over, adult: Secondary | ICD-10-CM | POA: Diagnosis not present

## 2018-10-14 DIAGNOSIS — E039 Hypothyroidism, unspecified: Secondary | ICD-10-CM

## 2018-10-14 DIAGNOSIS — D72829 Elevated white blood cell count, unspecified: Secondary | ICD-10-CM | POA: Diagnosis not present

## 2018-10-15 LAB — CBC WITH DIFFERENTIAL
Basophils Absolute: 0.1 10*3/uL (ref 0.0–0.2)
Basos: 1 %
EOS (ABSOLUTE): 0.3 10*3/uL (ref 0.0–0.4)
EOS: 3 %
Hematocrit: 40.2 % (ref 34.0–46.6)
Hemoglobin: 12.8 g/dL (ref 11.1–15.9)
IMMATURE GRANULOCYTES: 0 %
Immature Grans (Abs): 0 10*3/uL (ref 0.0–0.1)
LYMPHS ABS: 3.3 10*3/uL — AB (ref 0.7–3.1)
Lymphs: 31 %
MCH: 29.6 pg (ref 26.6–33.0)
MCHC: 31.8 g/dL (ref 31.5–35.7)
MCV: 93 fL (ref 79–97)
MONOS ABS: 0.7 10*3/uL (ref 0.1–0.9)
Monocytes: 7 %
Neutrophils Absolute: 6.2 10*3/uL (ref 1.4–7.0)
Neutrophils: 58 %
RBC: 4.33 x10E6/uL (ref 3.77–5.28)
RDW: 12.8 % (ref 11.7–15.4)
WBC: 10.7 10*3/uL (ref 3.4–10.8)

## 2018-10-15 LAB — COMPREHENSIVE METABOLIC PANEL
ALK PHOS: 84 IU/L (ref 39–117)
ALT: 14 IU/L (ref 0–32)
AST: 12 IU/L (ref 0–40)
Albumin/Globulin Ratio: 1 — ABNORMAL LOW (ref 1.2–2.2)
Albumin: 3.7 g/dL (ref 3.6–4.8)
BILIRUBIN TOTAL: 0.3 mg/dL (ref 0.0–1.2)
BUN/Creatinine Ratio: 11 — ABNORMAL LOW (ref 12–28)
BUN: 18 mg/dL (ref 8–27)
CHLORIDE: 100 mmol/L (ref 96–106)
CO2: 18 mmol/L — ABNORMAL LOW (ref 20–29)
Calcium: 9.1 mg/dL (ref 8.7–10.3)
Creatinine, Ser: 1.64 mg/dL — ABNORMAL HIGH (ref 0.57–1.00)
GFR calc Af Amer: 37 mL/min/{1.73_m2} — ABNORMAL LOW (ref >59)
GFR calc non Af Amer: 32 mL/min/{1.73_m2} — ABNORMAL LOW (ref >59)
GLUCOSE: 437 mg/dL — AB (ref 65–99)
Globulin, Total: 3.8 g/dL (ref 1.5–4.5)
Potassium: 4.9 mmol/L (ref 3.5–5.2)
Sodium: 137 mmol/L (ref 134–144)
Total Protein: 7.5 g/dL (ref 6.0–8.5)

## 2018-10-15 LAB — LIPID PANEL WITH LDL/HDL RATIO
Cholesterol, Total: 220 mg/dL — ABNORMAL HIGH (ref 100–199)
HDL: 31 mg/dL — ABNORMAL LOW (ref >39)
LDL Calculated: 120 mg/dL — ABNORMAL HIGH (ref 0–99)
LDl/HDL Ratio: 3.9 ratio — ABNORMAL HIGH (ref 0.0–3.2)
TRIGLYCERIDES: 344 mg/dL — AB (ref 0–149)
VLDL Cholesterol Cal: 69 mg/dL — ABNORMAL HIGH (ref 5–40)

## 2018-10-15 LAB — T3: T3, Total: 94 ng/dL (ref 71–180)

## 2018-10-15 LAB — T4, FREE: FREE T4: 1.36 ng/dL (ref 0.82–1.77)

## 2018-10-15 LAB — VITAMIN D 25 HYDROXY (VIT D DEFICIENCY, FRACTURES): VIT D 25 HYDROXY: 29.1 ng/mL — AB (ref 30.0–100.0)

## 2018-10-15 LAB — TSH: TSH: 1.85 u[IU]/mL (ref 0.450–4.500)

## 2018-10-18 NOTE — Progress Notes (Signed)
Office: [phone]  /  Fax: [phone]   HPI:   Chief Complaint: OBESITY Alice Kemp is here to discuss her progress with her obesity treatment plan. She is on the Category 3 plan and is following her eating plan approximately 50 % of the time. She states she is exercising 0 minutes 0 times per week. Alice Kemp did well with weight loss over the holiday. She is mostly portion control and smarter choices and is trying to be mindful. Her hunger is controlled.  Her weight is 252 lb (114.3 kg) today and has had a weight loss of 6 pounds over a period of 5 weeks since her last visit. She has lost 48 lbs since starting treatment with [name].  Vitamin D deficiency Alice Kemp has a diagnosis of vitamin D deficiency. She is currently taking prescription vit D every 3 days and is not yet at goal. She is due for labs today.   Hypothyroid Alice Kemp has a diagnosis of hypothyroidism. She is on Synthroid 36mcg. She does admit to ongoing fatigue and denies hot or cold intolerance.   Leukocytosis Alice Kemp has had an elevated white blood cell count in the past. She admits fatigue, but does not have recent labs to make sure levels have normalized. She is due for labs today.  ASSESSMENT AND PLAN:  Vitamin D deficiency - Plan: VITAMIN D 25 Hydroxy (Vit-D Deficiency, Fractures)  Hypothyroidism, unspecified type - Plan: T3, T4, free, TSH  Leukocytosis, unspecified type - Plan: Comprehensive metabolic panel, CBC With Differential, Lipid Panel With LDL/HDL Ratio  Class 3 severe obesity with serious comorbidity and body mass index (BMI) of 40.0 to 44.9 in adult, unspecified obesity type (Alice Kemp)  PLAN:  Vitamin D Deficiency Alice Kemp was informed that low vitamin D levels contributes to fatigue and are associated with obesity, breast, and colon cancer. She agrees to continue to take prescription Vit D @50 ,000 IU every week and will follow up for routine testing of vitamin D, at least 2-3 times per year. She was informed of the  risk of over-replacement of vitamin D and agrees to not increase her dose unless she discusses this with [name] first. We will order labs today and she agreed to follow up in 3 to 4 weeks.  Hypothyroid Alice Kemp was informed of the importance of good thyroid control to help with weight loss efforts. She was also informed that supertherapeutic thyroid levels are dangerous and will not improve weight loss results. We will order labs today. Alice Kemp agrees to continue taking Synthroid and will follow up as directed.  Leukocytosis We will check Alice Kemp's labs today and she agrees to follow up at the agreed upon time in 3 to 4 weeks.  Obesity Alice Kemp is currently in the action stage of change. As such, her goal is to continue with weight loss efforts. She has agreed to follow the Category 3 plan. Alice Kemp has been instructed to work up to a goal of 150 minutes of combined cardio and strengthening exercise per week for weight loss and overall health benefits. We discussed the following Behavioral Modification Strategies today: increasing lean protein intake, decreasing simple carbohydrates, and work on meal planning and easy cooking plans.  Alice Kemp has agreed to follow up with our clinic in 3 to 4 weeks. She was informed of the importance of frequent follow up visits to maximize her success with intensive lifestyle modifications for her multiple health conditions.  ALLERGIES: Allergies  Allergen Reactions  . Sulfa Antibiotics   . Penicillins Rash    MEDICATIONS:  Current Outpatient Medications on File Prior to Visit  Medication Sig Dispense Refill  . allopurinol (ZYLOPRIM) 100 MG tablet TAKE 1 TABLET BY MOUTH ONCE DAILY 90 tablet 1  . Alpha-Lipoic Acid 100 MG CAPS Take by mouth.    Marland Kitchen amitriptyline (ELAVIL) 10 MG tablet at bedtime as needed.   5  . aspirin 81 MG tablet Take 81 mg by mouth daily. Reported on 12/24/2015    . buPROPion (WELLBUTRIN XL) 300 MG 24 hr tablet Take 1 tablet (300 mg total) by mouth  daily. 90 tablet 1  . celecoxib (CELEBREX) 100 MG capsule Take 1 capsule (100 mg total) by mouth daily as needed. Yearly physical w/labs due in August must see MD for future refills 90 capsule 0  . Coenzyme Q10 (EQL COQ10) 300 MG CAPS Take 1 capsule (300 mg total) daily at 12 noon by mouth. 30 capsule 0  . colchicine 0.6 MG tablet TAKE 2 TABLETS BY MOUTH AT ONSET OF FLARE THEN TAKE 1 TABLET 1 HOUR LATER 15 tablet 0  . DULoxetine (CYMBALTA) 30 MG capsule Take 1 capsule (30 mg total) by mouth 2 (two) times daily. 180 capsule 1  . fluticasone (CUTIVATE) 0.05 % cream APPLY 3 TIMES A DAY AS NEEDED FOR ITCHING 60 g 1  . furosemide (LASIX) 80 MG tablet Take 1 tablet (80 mg total) by mouth daily. 90 tablet 3  . gabapentin (NEURONTIN) 300 MG capsule 300 mg 3 (three) times daily.     Marland Kitchen levocetirizine (XYZAL) 5 MG tablet TAKE 1 TABLET BY MOUTH EVERY EVENING 90 tablet 3  . levothyroxine (SYNTHROID, LEVOTHROID) 88 MCG tablet Take 88 mcg by mouth daily before breakfast.    . losartan (COZAAR) 25 MG tablet Take 1 tablet (25 mg total) by mouth daily. -- Office visit needed for further refills 90 tablet 0  . Magnesium 100 MG CAPS Take 2 capsules by mouth daily.    Marland Kitchen NOVOLOG MIX 70/30 (70-30) 100 UNIT/ML injection Inject into the skin. 85 units breakfast , 55 units lunch and dinner    . ONETOUCH VERIO test strip   3  . rosuvastatin (CRESTOR) 5 MG tablet Take 1 tablet (5 mg total) by mouth daily. 90 tablet 3  . spironolactone (ALDACTONE) 25 MG tablet TAKE 1 TABLET (25 MG TOTAL) BY MOUTH DAILY. 90 tablet 1  . traMADol (ULTRAM) 50 MG tablet Take by mouth every 12 (twelve) hours as needed.    . vitamin B-12 (CYANOCOBALAMIN) 500 MCG tablet Take 500 mcg by mouth 2 (two) times daily.    . Vitamin D, Ergocalciferol, (DRISDOL) 1.25 MG (50000 UT) CAPS capsule Take 1 capsule (50,000 Units total) by mouth every 3 (three) days. 10 capsule 0   No current facility-administered medications on file prior to visit.     PAST  MEDICAL HISTORY: Past Medical History:  Diagnosis Date  . Anxiety   . Arthritis   . Back pain   . Benign neoplasm of colon 06/29/2012  . Breast abscess 07/2011   left; s/p I&D + abx by gen surg  . Cervical radiculopathy at C6   . CKD (chronic kidney disease) stage 3, GFR 30-59 ml/min (HCC)    follows with renal  . Constipation   . Depression   . Diabetes mellitus type II   . Diastolic heart failure ([place]) 09/2013 echo  . Gall bladder inflammation   . Glaucoma   . Gout   . Hyperlipidemia   . Hypothyroidism   . Joint pain   .  Kidney disease   . Leg pain   . Morbid obesity ([place])   . OBSTRUCTIVE SLEEP APNEA 12/23/2010   npsg 2012:  AHI 25/hr, severe desat to 68%.   autoset 2013: optimal pressure 13cm   . PERICARDIAL EFFUSION 12/12/2010  . Pulmonary hypertension ([place])   . Shortness of breath   . Skin lesion of breast 07/09/2012  . Swelling of extremity   . Unspecified hypothyroidism   . Vitamin B12 deficiency   . Vitamin D deficiency     PAST SURGICAL HISTORY: Past Surgical History:  Procedure Laterality Date  . BREAST BIOPSY  1990   Left  . CATARACT EXTRACTION    . COLONOSCOPY  06/29/2012   Procedure: COLONOSCOPY;  Surgeon: Ladene Artist, MD,FACG;  Location: [facility];  Service: Endoscopy;  Laterality: N/A;  . SHOULDER SURGERY  2010   Left  . TONSILLECTOMY  1964    SOCIAL HISTORY: Social History   Tobacco Use  . Smoking status: Never Smoker  . Smokeless tobacco: Never Used  Substance Use Topics  . Alcohol use: No    Alcohol/week: 0.0 standard drinks    Frequency: Never  . Drug use: No    FAMILY HISTORY: Family History  Problem Relation Age of Onset  . Hypertension Mother   . Heart disease Mother   . Heart attack Mother   . Thyroid disease Mother   . Diabetes Father   . Heart disease Father   . Skin cancer Father   . Cancer Father        skin  . Heart attack Father   . Hyperlipidemia Father   . Colon polyps Father   . Depression Father   .  Hypertension Brother   . Skin cancer Paternal Grandfather   . Colon cancer Paternal Grandfather 31  . Breast cancer Paternal Aunt   . Cancer Paternal Aunt        breast  . Diabetes Paternal Uncle   . Diabetes Maternal Grandfather   . Sudden death Neg Hx     ROS: Review of Systems  Constitutional: Positive for malaise/fatigue and weight loss.  Endo/Heme/Allergies:       Negative for heat intolerance. Negative for cold intolerance.    PHYSICAL EXAM: Blood pressure 112/75, pulse 82, temperature 97.8 F (36.6 C), temperature source Oral, height 5\' 3"  (1.6 m), weight 252 lb (114.3 kg), SpO2 97 %. Body mass index is 44.64 kg/m. Physical Exam Vitals signs reviewed.  Constitutional:      Appearance: Normal appearance. She is obese.  Cardiovascular:     Rate and Rhythm: Normal rate.  Pulmonary:     Effort: Pulmonary effort is normal.  Musculoskeletal: Normal range of motion.  Skin:    General: Skin is warm and dry.  Neurological:     Mental Status: She is alert and oriented to person, place, and time.  Psychiatric:        Mood and Affect: Mood normal.        Behavior: Behavior normal.     RECENT LABS AND TESTS: BMET    Component Value Date/Time   NA 137 10/14/2018 1428   K 4.9 10/14/2018 1428   CL 100 10/14/2018 1428   CO2 18 (L) 10/14/2018 1428   GLUCOSE 437 (H) 10/14/2018 1428   GLUCOSE 130 (H) 01/08/2016 1649   BUN 18 10/14/2018 1428   CREATININE 1.64 (H) 10/14/2018 1428   CALCIUM 9.1 10/14/2018 1428   GFRNONAA 32 (L) 10/14/2018 1428   GFRAA 37 (L)  10/14/2018 1428   Lab Results  Component Value Date   HGBA1C 9.9 (H) 05/17/2018   HGBA1C 8.8 (H) 12/17/2017   HGBA1C 7.9 (H) 07/30/2017   HGBA1C 8.1 (H) 04/10/2015   HGBA1C 10.0 (H) 04/11/2014   Lab Results  Component Value Date   INSULIN 9.8 05/17/2018   INSULIN 31.9 (H) 12/17/2017   INSULIN 37.2 (H) 07/30/2017   CBC    Component Value Date/Time   WBC 10.7 10/14/2018 1428   WBC 8.9 09/23/2013 1149    RBC 4.33 10/14/2018 1428   RBC 4.58 09/23/2013 1149   HGB 12.8 10/14/2018 1428   HCT 40.2 10/14/2018 1428   PLT 213 12/15/2016   MCV 93 10/14/2018 1428   MCH 29.6 10/14/2018 1428   MCH 28.1 01/29/2011 0500   MCHC 31.8 10/14/2018 1428   MCHC 33.0 09/23/2013 1149   RDW 12.8 10/14/2018 1428   LYMPHSABS 3.3 (H) 10/14/2018 1428   MONOABS 0.6 09/23/2013 1149   EOSABS 0.3 10/14/2018 1428   BASOSABS 0.1 10/14/2018 1428   Iron/TIBC/Ferritin/ %Sat No results found for: IRON, TIBC, FERRITIN, IRONPCTSAT Lipid Panel     Component Value Date/Time   CHOL 220 (H) 10/14/2018 1428   TRIG 344 (H) 10/14/2018 1428   HDL 31 (L) 10/14/2018 1428   CHOLHDL 4 04/10/2015 1212   VLDL 38.8 04/10/2015 1212   LDLCALC 120 (H) 10/14/2018 1428   LDLDIRECT 54.6 05/10/2012 1440   Hepatic Function Panel     Component Value Date/Time   PROT 7.5 10/14/2018 1428   ALBUMIN 3.7 10/14/2018 1428   AST 12 10/14/2018 1428   ALT 14 10/14/2018 1428   ALKPHOS 84 10/14/2018 1428   BILITOT 0.3 10/14/2018 1428   BILIDIR 0.1 05/10/2012 1440      Component Value Date/Time   TSH 1.850 10/14/2018 1428   TSH 2.610 07/30/2017 1020   TSH 1.62 04/10/2015 1212   Results for Berenson, TANASIA BUDZINSKI (MRN [id number]) as of 10/18/2018 11:38  Ref. Range 05/17/2018 11:28  Vitamin D, 25-Hydroxy Latest Ref Range: 30.0 - 100.0 ng/mL 23.6 (L)    OBESITY BEHAVIORAL INTERVENTION VISIT  Today's visit was # 18   Starting weight: 300 lbs Starting date: 07/29/17 Today's weight : Weight: 252 lb (114.3 kg)  Today's date: 10/14/2018 Total lbs lost to date: 48 At least 15 minutes were spent on discussing the following behavioral intervention visit.  ASK: We discussed the diagnosis of obesity with Alice Kemp today and Alice Kemp agreed to give [name] permission to discuss obesity behavioral modification therapy today.  ASSESS: Alice Kemp has the diagnosis of obesity and her BMI today is 44.6. Alice Kemp is in the action stage of change.    ADVISE: Alice Kemp was educated on the multiple health risks of obesity as well as the benefit of weight loss to improve her health. She was advised of the need for long term treatment and the importance of lifestyle modifications to improve her current health and to decrease her risk of future health problems.  AGREE: Multiple dietary modification options and treatment options were discussed and Alice Kemp agreed to follow the recommendations documented in the above note.  ARRANGE: Alice Kemp was educated on the importance of frequent visits to treat obesity as outlined per CMS and USPSTF guidelines and agreed to schedule her next follow up appointment today.  I, Alice Kemp, am acting as transcriptionist for Starlyn Skeans, MD  I have reviewed the above documentation for accuracy and completeness, and I agree with the above. -Dennard Nip, MD

## 2018-10-20 NOTE — Progress Notes (Signed)
Please call Alice Kemp and make sure she brings in her BS log to her next visit.

## 2018-10-20 NOTE — Progress Notes (Signed)
Spoke with the patient who states she has not been taking her BS. Asked her to take them until her next visit with [name] and bring them in to review. Patient verbalized understanding. Alice Kemp, [place]

## 2018-10-26 NOTE — Progress Notes (Unsigned)
Office: [phone]  /  Fax: [phone]    Date: November 04, 2018   Time Seen:*** Duration:*** Provider: Glennie Kemp, Psy.D. Type of Session: Individual Therapy  Type of Contact: Face-to-face  HPI: Alice Kemp referred by Dr. Lillia Kemp to depression with emotional eating behaviors.She was seen for an initial appointment by this provider on August 02, 2018.Per the note for the visit withDr. Desmond Kemp July 19, 2018, "Sandrais on wellbutrin and cymbalta and her mood Kemp been stable; but she Kemp been feeling discouraged and she notes increased emotional eating. Alice Kemp struggleswith emotional eating and using food for comfort to the extent that it is negatively impacting herhealth. Sheoften snacks when sheis not hungry. Sandrasometimes feels sheis out of control and then feels guilty that shemade poor food choices. Alice Kemp been working on behavior modification techniques to help reduce heremotional eating and Kemp been somewhat successful.Sheshows no sign of suicidal or homicidal ideations."In addition, per the note for the initial visit withDr. Desmond Kemp July 29, 2017,Alice Kemp been heavy most of her life and she started gaining weight around 2010 to2013. Her heaviest weight ever was 309 pounds. During the initial appointment with Dr. Leafy Kemp, Alice Kemp experiencing the following:significant food cravings issues; snacking frequently in the evenings; skipping meals frequently; frequently drinking liquids with calories; frequently eating larger portions than normal; and struggling with emotional eating.Alice Kemp's Food and Mood (modified PHQ-9) score was22 for the initial appointment with Dr. Leafy Kemp.Duringthe initial appointment with this provider, Alice Kemp reported a fluctuation in her weight loss. Currently, she follows the prescribed meal plan 40% of the time and discussed ongoing stressors. More specifically, when her past father passed away  in [place], Alice Kemp took a break from the program with this clinicfromMay until August. She noted the last time she engaged in emotional andbingeeating was early October. In addition, Alice Kemp shared that she typically skips breakfast and her first meal of the day is usually after 12 PM. Alice Kemp reported engaging in emotional and binge eating all her life. She denied a history of engagement in purgingandother compensatory strategies. She also denied ever being diagnosed with an eating disorder. When Kemp about what binge episode looks like for Alice Kemp, she reported consuming a bag of fun size candy bars. Furthermore,Alice Kemp to complete a questionnaire assessing various behaviors related to emotional eating. Alice Kemp the following: overeat when you are celebrating, experience food cravings on a regular basis, eat certain foods when you are anxious, stressed, depressed, or your feelings are hurt, use food to help you cope with emotional situations, find food is comforting to you, overeat when you are angry or upset, overeat when you are worried about something, overeat frequently when you are bored or lonely, overeat when you are alone, but eat much less when you are with other people and eat as a reward.     During today's appointment, Alice Kemp reported ***  Session Content: Session focused on the following treatment goal: decrease emotional eating. The session was initiated with the administration of the PHQ-9 and GAD-7, as well as a brief check-in. *** This provider discussed termination planning, including the option for a referral for longer-term therapeutic services. Alice Kemp was receptive to today's session as evidenced by openness to sharing, responsiveness to feedback, and ***.  Mental Status Examination: Alice Kemp arrived on time for the appointment. She presented as appropriately dressed and groomed. Alice Kemp appeared her stated age and demonstrated adequate orientation to time, place,  person, and purpose of the appointment. She also demonstrated appropriate eye contact. No psychomotor abnormalities  or behavioral peculiarities noted. Her mood was {gbmood:21757} with congruent affect. Her thought processes were logical, linear, and goal-directed. No hallucinations, delusions, bizarre thinking or behavior reported or observed. Judgment, insight, and impulse control appeared to be grossly intact. There was no evidence of paraphasias (i.e., errors in speech, gross mispronunciations, and word substitutions), repetition deficits, or disturbances in volume or prosody (i.e., rhythm and intonation). There was no evidence of attention or memory impairments. Alice Kemp denied current suicidal and homicidal ideation, plan and intent.   Structured Assessment Results: The Patient Health Questionnaire-9 (PHQ-9) is a self-report measure that assesses symptoms and severity of depression over the course of the last two weeks. Alice Kemp obtained a score of *** suggesting {GBPHQ9SEVERITY:21752}. Alice Kemp finds the endorsed symptoms to be {gbphq9difficulty:21754}.  The Generalized Anxiety Disorder-7 (GAD-7) is a brief self-report measure that assesses symptoms of anxiety over the course of the last two weeks. Alice Kemp obtained a score of *** suggesting {gbgad7severity:21753}.  Interventions:  {Interventions:22172}  DSM-5 Diagnosis: 296.32 (F33.1) Major Depressive Disorder, Recurrent Episode, Moderate, With Anxious Distress, Moderate  Treatment Goal & Progress: During the initial appointment with this provider, the following treatment goal was established: decrease emotional eating. Alice Kemp Kemp demonstrated progress in her goal as evidenced by ***  Plan: Alice Kemp continues to appear able and willing to participate as evidenced by engagement in reciprocal conversation, and asking questions for clarification as appropriate.*** The next appointment will be scheduled in {gbweeks:21758}. The next session will focus on  reviewing learned skills, and working towards the established treatment goal.***

## 2018-11-04 ENCOUNTER — Ambulatory Visit (INDEPENDENT_AMBULATORY_CARE_PROVIDER_SITE_OTHER): Payer: Self-pay | Admitting: Psychology

## 2018-11-04 ENCOUNTER — Encounter (INDEPENDENT_AMBULATORY_CARE_PROVIDER_SITE_OTHER): Payer: Self-pay

## 2018-11-04 ENCOUNTER — Ambulatory Visit (INDEPENDENT_AMBULATORY_CARE_PROVIDER_SITE_OTHER): Payer: Self-pay | Admitting: Family Medicine

## 2018-11-17 ENCOUNTER — Ambulatory Visit (INDEPENDENT_AMBULATORY_CARE_PROVIDER_SITE_OTHER): Payer: Self-pay | Admitting: Physician Assistant

## 2018-11-17 ENCOUNTER — Ambulatory Visit (INDEPENDENT_AMBULATORY_CARE_PROVIDER_SITE_OTHER): Payer: Self-pay | Admitting: Psychology

## 2018-11-17 ENCOUNTER — Encounter (INDEPENDENT_AMBULATORY_CARE_PROVIDER_SITE_OTHER): Payer: Self-pay

## 2018-11-29 ENCOUNTER — Encounter (INDEPENDENT_AMBULATORY_CARE_PROVIDER_SITE_OTHER): Payer: Self-pay

## 2018-11-29 ENCOUNTER — Ambulatory Visit (INDEPENDENT_AMBULATORY_CARE_PROVIDER_SITE_OTHER): Payer: Self-pay | Admitting: Psychology

## 2018-11-30 ENCOUNTER — Ambulatory Visit (INDEPENDENT_AMBULATORY_CARE_PROVIDER_SITE_OTHER): Payer: Self-pay | Admitting: Family Medicine

## 2018-12-06 ENCOUNTER — Telehealth: Payer: Self-pay | Admitting: Internal Medicine

## 2018-12-06 NOTE — Telephone Encounter (Signed)
Copied from [place] [id number]. Topic: Quick Communication - See Telephone Encounter >> Dec 06, 2018 10:42 AM Blase Mess A wrote: CRM for notification. See Telephone encounter for: 12/06/18.  Patient is calling back she missed a call to schedule her AWV. Please advise. Thank you.

## 2018-12-13 ENCOUNTER — Other Ambulatory Visit (INDEPENDENT_AMBULATORY_CARE_PROVIDER_SITE_OTHER): Payer: Self-pay | Admitting: Family Medicine

## 2018-12-13 ENCOUNTER — Other Ambulatory Visit: Payer: Self-pay | Admitting: Internal Medicine

## 2018-12-13 DIAGNOSIS — E559 Vitamin D deficiency, unspecified: Secondary | ICD-10-CM

## 2018-12-13 NOTE — Progress Notes (Signed)
Office: [phone]  /  Fax: [phone]    Date: 12/14/2018   Time Seen:11:40am Duration: 33 minutes Provider: Glennie Isle, Psy.D. Type of Session: Individual Therapy  Type of Contact: Face-to-face  Session Content: Neyda is a 68 y.o. female presenting for a follow-up appointment to address the previously established treatment goal of decreasing emotional eating. The session was initiated with the administration of the PHQ-9 and GAD-7, as well as a brief check-in. Crimson reported she will have to discontinue services with this provider due to her insurance, and current finances. She further indicated, "I've been flat on my back for the past three and a half weeks." Amarria shared about various medical concerns contributed to her back pain. She also discussed having car issues. Regarding financial concerns, Jazell noted she is trying to find a job, and added, "I'm still worried about the money situation." She noted calling [employer], but was reportedly informed they will not help until she is behind on bills. 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 Regarding eating, Emelly stated, "I lost 4 pounds, but I don't want to eat." Nevertheless, she noted, "I'll get back on track." Her confidence was reflected to her. Carisma discussed a plan of gaining employment, and noted a desire to increase social interaction. As such, this provider and Maelani discussed volunteering until she gains employment; Dyamon was agreeable. This provider and Preethi also explored other options for increasing social interaction. She indicated she continues to call her mother 3 times a day, and was receptive to going to church this coming Sunday. Psychoeducation regarding pleasurable activities was provided again, including its impact on emotional eating and mood. Ionia was provided with a handout with various options of  pleasurable activities, and was encouraged to engage in at least one activity a day. [name] Look agreed. She acknowledged enjoying music, and was agreeable to listening to music on her drive home from today's appointment. Maurissa also reported she has been watching [employer] for craft ideas, and noted a plan to create something for her mother as well as her father's grave. Krystianna was receptive to today's session as evidenced by openness to sharing, responsiveness to feedback, and willingness to engage in pleasurable activities.  Mental Status Examination: Lydia arrived early for the appointment. She presented as appropriately dressed and groomed. Tamyka appeared her stated age and demonstrated adequate orientation to time, place, person, and purpose of the appointment. She also demonstrated appropriate eye contact. No psychomotor abnormalities or behavioral peculiarities noted. Her mood was sad with congruent affect. She was observed becoming tearful; however, as the appointment progressed, Riki was observed smiling and laughing. Her thought processes were logical, linear, and goal-directed. No hallucinations, delusions, bizarre thinking or behavior reported or observed. Judgment, insight, and impulse control appeared to be grossly intact. There was no evidence of paraphasias (i.e., errors in speech, gross mispronunciations, and word substitutions), repetition deficits, or disturbances in volume or prosody (i.e., rhythm and intonation). There was no evidence of attention or memory impairments. Kritika denied current suicidal and homicidal ideation, plan and intent.   Structured Assessment Results: The Patient Health Questionnaire-9 (PHQ-9) is a self-report measure that assesses symptoms and severity of depression over the course of the last two weeks. Cathlin obtained a score of 14 suggesting moderate depression. Yemariam finds the endorsed symptoms to be very difficult. Depression screen PHQ 2/9 12/14/2018    Decreased Interest 3  Down, Depressed, Hopeless 2  PHQ -  2 Score 5  Altered sleeping 2  Tired, decreased energy 2  Change in appetite 2  Feeling bad or failure about yourself  1  Trouble concentrating 2  Moving slowly or fidgety/restless 0  Suicidal thoughts 0  PHQ-9 Score 14  Difficult doing work/chores -  Some recent data might be hidden   The Generalized Anxiety Disorder-7 (GAD-7) is a brief self-report measure that assesses symptoms of anxiety over the course of the last two weeks. Milcah obtained a score of 14 suggesting moderate anxiety. GAD 7 : Generalized Anxiety Score 12/14/2018  Nervous, Anxious, on Edge 2  Control/stop worrying 2  Worry too much - different things 2  Trouble relaxing 2  Restless 2  Easily annoyed or irritable 2  Afraid - awful might happen 2  Total GAD 7 Score 14  Anxiety Difficulty Very difficult   Interventions:  Administration of PHQ-9 and GAD-7 for symptom monitoring Review of content from the previous session Empathic reflections and validation Problem solving Further psychoeducation regarding pleasurable activities Positive reinforcement Brief chart review  DSM-5 Diagnosis: 296.32 (F33.1) Major Depressive Disorder, Recurrent Episode, Moderate, With Anxious Distress, Moderate  Treatment Goal & Progress: During the initial appointment with this provider, the following treatment goal was established: decrease emotional eating. Asante has demonstrated some progress in her goal as evidenced by increased awareness of hunger patterns and triggers for emotional eating.   Plan: Marguerite declined future appointments with this provider due to her insurance and current financial concerns. As such, she also declined an option of a referral. This provider's clinic will call Bryan's insurance to determine her benefits, as [place] expressed uncertainty. In addition, therapist options that offer services on a sliding scale will be shared with [name] Look.

## 2018-12-14 ENCOUNTER — Telehealth (INDEPENDENT_AMBULATORY_CARE_PROVIDER_SITE_OTHER): Payer: Self-pay | Admitting: Psychology

## 2018-12-14 ENCOUNTER — Ambulatory Visit (INDEPENDENT_AMBULATORY_CARE_PROVIDER_SITE_OTHER): Payer: PPO | Admitting: Psychology

## 2018-12-14 ENCOUNTER — Ambulatory Visit (INDEPENDENT_AMBULATORY_CARE_PROVIDER_SITE_OTHER): Payer: PPO | Admitting: Physician Assistant

## 2018-12-14 ENCOUNTER — Encounter (INDEPENDENT_AMBULATORY_CARE_PROVIDER_SITE_OTHER): Payer: Self-pay | Admitting: Physician Assistant

## 2018-12-14 VITALS — BP 111/75 | HR 103 | Temp 98.0°F | Ht 63.0 in | Wt 248.0 lb

## 2018-12-14 DIAGNOSIS — F3289 Other specified depressive episodes: Secondary | ICD-10-CM | POA: Diagnosis not present

## 2018-12-14 DIAGNOSIS — Z794 Long term (current) use of insulin: Secondary | ICD-10-CM | POA: Diagnosis not present

## 2018-12-14 DIAGNOSIS — F331 Major depressive disorder, recurrent, moderate: Secondary | ICD-10-CM

## 2018-12-14 DIAGNOSIS — E119 Type 2 diabetes mellitus without complications: Secondary | ICD-10-CM

## 2018-12-14 DIAGNOSIS — Z9189 Other specified personal risk factors, not elsewhere classified: Secondary | ICD-10-CM

## 2018-12-14 DIAGNOSIS — E559 Vitamin D deficiency, unspecified: Secondary | ICD-10-CM

## 2018-12-14 DIAGNOSIS — Z6841 Body Mass Index (BMI) 40.0 and over, adult: Secondary | ICD-10-CM | POA: Diagnosis not present

## 2018-12-14 MED FILL — CELECOXIB 100 MG CAPS: 100 | 30 days supply | Qty: 30 | Fill #0

## 2018-12-14 NOTE — Telephone Encounter (Signed)
Spoke with patient advised of Mental Health Benefits. Patient effective as of 10/06/2018. No Deductible, Max out of pocket $3400.00 Copay $30.00 outpatient visit. Meet $30.00 of Max out of pocket. This Probation officer spoke with [employer]. With [employer] Ref no. [id number]. After giving information patient would like to think it over before making another appointment with Dr. Mallie Mussel. She indicated that she will call back on 3/11 with a decision. She was receptive to this Probation officer following up with her if she did not call back tomorrow. If necessary, options for other providers offering sliding scale fees will be shared with patient.

## 2018-12-14 NOTE — Progress Notes (Signed)
Office: [phone]  /  Fax: [phone]   HPI:   Chief Complaint: OBESITY Alice Kemp is here to discuss her progress with her obesity treatment plan. She is on the Category 3 plan and is following her eating plan approximately 30 % of the time. She states she is exercising 0 minutes 0 times per week. Alice Kemp is having Alice very difficult time getting in all of the food on her plan. She states that she is not hungry, does not feel like cooking, and has no motivation.  Her weight is 248 lb (112.5 kg) today and has had Alice weight loss of 4 pounds over Alice period of 9 weeks since her last visit. She has lost 52 lbs since starting treatment with [name].  Vitamin D deficiency Alice Kemp has Alice diagnosis of vitamin D deficiency. She is currently taking vit D and denies nausea, vomiting, or muscle weakness.  Diabetes II Alice Kemp has Alice diagnosis of diabetes type II. Alice Kemp states that she is not currently checking blood sugars due to defective test strips. Her last A1c was 9.9 on 05/17/18. She has been working on intensive lifestyle modifications including diet, exercise, and weight loss to help control her blood glucose levels.  Depression with emotional eating behaviors Alice Kemp is struggling with emotional eating and using food for comfort to the extent that it is negatively impacting her health. She often snacks when she is not hungry. Alice Kemp sometimes feels she is out of control and then feels guilty that she made poor food choices. She has been working on behavior modification techniques to help reduce her emotional eating and has been somewhat successful. Alice Kemp denies cravings. She shows no sign of suicidal or homicidal ideations.  ASSESSMENT AND PLAN:  Vitamin D deficiency - Plan: Vitamin D, Ergocalciferol, (DRISDOL) 1.25 MG (50000 UT) CAPS capsule  Type 2 diabetes mellitus without complication, with long-term current use of insulin (HCC) - Plan: ONETOUCH VERIO test strip  Other depression - with emotional  eating - Plan: buPROPion (WELLBUTRIN SR) 200 MG 12 hr tablet  At risk for osteoporosis  Class 3 severe obesity with serious comorbidity and body mass index (BMI) of 40.0 to 44.9 in adult, unspecified obesity type ([place])  PLAN:  Vitamin D Deficiency Alice Kemp was informed that low vitamin D levels contributes to fatigue and are associated with obesity, breast, and colon cancer. Ashby agrees to continue to take prescription Vit D @50 ,000 IU every 3 days #10 with no refills and will follow up for routine testing of vitamin D, at least 2-3 times per year. She was informed of the risk of over-replacement of vitamin D and agrees to not increase her dose unless she discusses this with [name] first. Alice Kemp agrees to follow up in 4 weeks as directed.  Diabetes II Alice Kemp has been given extensive diabetes education by myself today including ideal fasting and post-prandial blood glucose readings, individual ideal Hgb A1c goals, and hypoglycemia prevention. We discussed the importance of good blood sugar control to decrease the likelihood of diabetic complications such as nephropathy, neuropathy, limb loss, blindness, coronary artery disease, and death. We discussed the importance of intensive lifestyle modification including diet, exercise and weight loss as the first line treatment for diabetes. Alice Kemp agrees to continue her diabetes medications and we will refill test strips #100 with no refills and she will follow up at the agreed upon time.   Depression with Emotional Eating Behaviors We discussed behavior modification techniques today to help Alice Kemp deal with her emotional eating and depression.  She has agreed to take Wellbutrin XR 200 mg qAM #30 with no refills and agreed to follow up as directed.  Obesity Alice Kemp is currently in the action stage of change. As such, her goal is to continue with weight loss efforts. She has agreed to follow the Category 3 plan. Alice Kemp has been instructed to work up to Alice goal  of 150 minutes of combined cardio and strengthening exercise per week for weight loss and overall health benefits. We discussed the following Behavioral Modification Strategies today: increasing lean protein intake, no skipping meals, and work on meal planning and easy cooking plans. Alice Kemp has agreed to see our registered dietitian in 2 weeks to review Category 3 plan.  Alice Kemp has agreed to follow up with our clinic in 4 weeks. She was informed of the importance of frequent follow up visits to maximize her success with intensive lifestyle modifications for her multiple health conditions.  ALLERGIES: Allergies  Allergen Reactions  . Sulfa Antibiotics   . Penicillins Rash    MEDICATIONS: Current Outpatient Medications on File Prior to Visit  Medication Sig Dispense Refill  . allopurinol (ZYLOPRIM) 100 MG tablet TAKE 1 TABLET BY MOUTH ONCE DAILY 90 tablet 1  . Alpha-Lipoic Acid 100 MG CAPS Take by mouth.    Marland Kitchen amitriptyline (ELAVIL) 10 MG tablet at bedtime as needed.   5  . aspirin 81 MG tablet Take 81 mg by mouth daily. Reported on 12/24/2015    . celecoxib (CELEBREX) 100 MG capsule Take 1 capsule (100 mg total) by mouth daily as needed. Yearly physical w/labs due in August must see MD for future refills 90 capsule 0  . Coenzyme Q10 (EQL COQ10) 300 MG CAPS Take 1 capsule (300 mg total) daily at 12 noon by mouth. 30 capsule 0  . colchicine 0.6 MG tablet TAKE 2 TABLETS BY MOUTH AT ONSET OF FLARE THEN TAKE 1 TABLET 1 HOUR LATER 15 tablet 0  . DULoxetine (CYMBALTA) 30 MG capsule Take 1 capsule (30 mg total) by mouth 2 (two) times daily. 180 capsule 1  . fluticasone (CUTIVATE) 0.05 % cream APPLY 3 TIMES Alice DAY AS NEEDED FOR ITCHING 60 g 1  . furosemide (LASIX) 80 MG tablet Take 1 tablet (80 mg total) by mouth daily. 90 tablet 3  . gabapentin (NEURONTIN) 300 MG capsule 300 mg 3 (three) times daily.     Marland Kitchen levocetirizine (XYZAL) 5 MG tablet TAKE 1 TABLET BY MOUTH EVERY EVENING 90 tablet 3  .  levothyroxine (SYNTHROID, LEVOTHROID) 88 MCG tablet Take 88 mcg by mouth daily before breakfast.    . losartan (COZAAR) 25 MG tablet Take 1 tablet (25 mg total) by mouth daily. -- Office visit needed for further refills 90 tablet 0  . Magnesium 100 MG CAPS Take 2 capsules by mouth daily.    Marland Kitchen NOVOLOG MIX 70/30 (70-30) 100 UNIT/ML injection Inject into the skin. 85 units breakfast , 55 units lunch and dinner    . ONETOUCH VERIO test strip   3  . rosuvastatin (CRESTOR) 5 MG tablet Take 1 tablet (5 mg total) by mouth daily. 90 tablet 3  . spironolactone (ALDACTONE) 25 MG tablet TAKE 1 TABLET (25 MG TOTAL) BY MOUTH DAILY. 90 tablet 1  . traMADol (ULTRAM) 50 MG tablet Take by mouth every 12 (twelve) hours as needed.    . vitamin B-12 (CYANOCOBALAMIN) 500 MCG tablet Take 500 mcg by mouth 2 (two) times daily.    . Vitamin D, Ergocalciferol, (DRISDOL) 1.25 MG (  50000 UT) CAPS capsule Take 1 capsule (50,000 Units total) by mouth every 3 (three) days. 10 capsule 0   No current facility-administered medications on file prior to visit.     PAST MEDICAL HISTORY: Past Medical History:  Diagnosis Date  . Anxiety   . Arthritis   . Back pain   . Benign neoplasm of colon 06/29/2012  . Breast abscess 07/2011   left; s/p I&D + abx by gen surg  . Cervical radiculopathy at C6   . CKD (chronic kidney disease) stage 3, GFR 30-59 ml/min (HCC)    follows with renal  . Constipation   . Depression   . Diabetes mellitus type II   . Diastolic heart failure ([place]) 09/2013 echo  . Gall bladder inflammation   . Glaucoma   . Gout   . Hyperlipidemia   . Hypothyroidism   . Joint pain   . Kidney disease   . Leg pain   . Morbid obesity ([place])   . OBSTRUCTIVE SLEEP APNEA 12/23/2010   npsg 2012:  AHI 25/hr, severe desat to 68%.   autoset 2013: optimal pressure 13cm   . PERICARDIAL EFFUSION 12/12/2010  . Pulmonary hypertension ([place])   . Shortness of breath   . Skin lesion of breast 07/09/2012  . Swelling of extremity     . Unspecified hypothyroidism   . Vitamin B12 deficiency   . Vitamin D deficiency     PAST SURGICAL HISTORY: Past Surgical History:  Procedure Laterality Date  . BREAST BIOPSY  1990   Left  . CATARACT EXTRACTION    . COLONOSCOPY  06/29/2012   Procedure: COLONOSCOPY;  Surgeon: Ladene Artist, MD,FACG;  Location: [facility];  Service: Endoscopy;  Laterality: N/Alice;  . SHOULDER SURGERY  2010   Left  . TONSILLECTOMY  1964    SOCIAL HISTORY: Social History   Tobacco Use  . Smoking status: Never Smoker  . Smokeless tobacco: Never Used  Substance Use Topics  . Alcohol use: No    Alcohol/week: 0.0 standard drinks    Frequency: Never  . Drug use: No    FAMILY HISTORY: Family History  Problem Relation Age of Onset  . Hypertension Mother   . Heart disease Mother   . Heart attack Mother   . Thyroid disease Mother   . Diabetes Father   . Heart disease Father   . Skin cancer Father   . Cancer Father        skin  . Heart attack Father   . Hyperlipidemia Father   . Colon polyps Father   . Depression Father   . Hypertension Brother   . Skin cancer Paternal Grandfather   . Colon cancer Paternal Grandfather 80  . Breast cancer Paternal Aunt   . Cancer Paternal Aunt        breast  . Diabetes Paternal Uncle   . Diabetes Maternal Grandfather   . Sudden death Neg Hx    ROS: Review of Systems  Constitutional: Positive for weight loss.  Gastrointestinal: Negative for nausea and vomiting.  Musculoskeletal:       Negative for muscle weakness.  Psychiatric/Behavioral: Positive for depression. Negative for suicidal ideas.       Negative for homicidal ideations.   PHYSICAL EXAM: Blood pressure 111/75, pulse (!) 103, temperature 98 F (36.7 C), height 5\' 3"  (1.6 m), weight 248 lb (112.5 kg), SpO2 93 %. Body mass index is 43.93 kg/m. Physical Exam Vitals signs reviewed.  Constitutional:  Appearance: Normal appearance. She is obese.  Cardiovascular:     Rate and  Rhythm: Normal rate.  Pulmonary:     Effort: Pulmonary effort is normal.  Musculoskeletal: Normal range of motion.  Skin:    General: Skin is warm and dry.  Neurological:     Mental Status: She is alert and oriented to person, place, and time.  Psychiatric:        Mood and Affect: Mood normal.        Behavior: Behavior normal.    RECENT LABS AND TESTS: BMET    Component Value Date/Time   NA 137 10/14/2018 1428   K 4.9 10/14/2018 1428   CL 100 10/14/2018 1428   CO2 18 (L) 10/14/2018 1428   GLUCOSE 437 (H) 10/14/2018 1428   GLUCOSE 130 (H) 01/08/2016 1649   BUN 18 10/14/2018 1428   CREATININE 1.64 (H) 10/14/2018 1428   CALCIUM 9.1 10/14/2018 1428   GFRNONAA 32 (L) 10/14/2018 1428   GFRAA 37 (L) 10/14/2018 1428   Lab Results  Component Value Date   HGBA1C 9.9 (H) 05/17/2018   HGBA1C 8.8 (H) 12/17/2017   HGBA1C 7.9 (H) 07/30/2017   HGBA1C 8.1 (H) 04/10/2015   HGBA1C 10.0 (H) 04/11/2014   Lab Results  Component Value Date   INSULIN 9.8 05/17/2018   INSULIN 31.9 (H) 12/17/2017   INSULIN 37.2 (H) 07/30/2017   CBC    Component Value Date/Time   WBC 10.7 10/14/2018 1428   WBC 8.9 09/23/2013 1149   RBC 4.33 10/14/2018 1428   RBC 4.58 09/23/2013 1149   HGB 12.8 10/14/2018 1428   HCT 40.2 10/14/2018 1428   PLT 213 12/15/2016   MCV 93 10/14/2018 1428   MCH 29.6 10/14/2018 1428   MCH 28.1 01/29/2011 0500   MCHC 31.8 10/14/2018 1428   MCHC 33.0 09/23/2013 1149   RDW 12.8 10/14/2018 1428   LYMPHSABS 3.3 (H) 10/14/2018 1428   MONOABS 0.6 09/23/2013 1149   EOSABS 0.3 10/14/2018 1428   BASOSABS 0.1 10/14/2018 1428   Iron/TIBC/Ferritin/ %Sat No results found for: IRON, TIBC, FERRITIN, IRONPCTSAT Lipid Panel     Component Value Date/Time   CHOL 220 (H) 10/14/2018 1428   TRIG 344 (H) 10/14/2018 1428   HDL 31 (L) 10/14/2018 1428   CHOLHDL 4 04/10/2015 1212   VLDL 38.8 04/10/2015 1212   LDLCALC 120 (H) 10/14/2018 1428   LDLDIRECT 54.6 05/10/2012 1440   Hepatic  Function Panel     Component Value Date/Time   PROT 7.5 10/14/2018 1428   ALBUMIN 3.7 10/14/2018 1428   AST 12 10/14/2018 1428   ALT 14 10/14/2018 1428   ALKPHOS 84 10/14/2018 1428   BILITOT 0.3 10/14/2018 1428   BILIDIR 0.1 05/10/2012 1440      Component Value Date/Time   TSH 1.850 10/14/2018 1428   TSH 2.610 07/30/2017 1020   TSH 1.62 04/10/2015 1212   Results for Schauf, LADAWNA WALGREN (MRN [id number]) as of 12/14/2018 14:40  Ref. Range 10/14/2018 14:28  Vitamin D, 25-Hydroxy Latest Ref Range: 30.0 - 100.0 ng/mL 29.1 (L)   OBESITY BEHAVIORAL INTERVENTION VISIT  Today's visit was # 19  Starting weight: 300 lbs Starting date: 07/29/17 Today's weight : Weight: 248 lb (112.5 kg)  Today's date: 12/14/2018 Total lbs lost to date: 52    12/14/2018  Height 5\' 3"  (1.6 m)  Weight 248 lb (112.5 kg)  BMI (Calculated) 43.94  BLOOD PRESSURE - SYSTOLIC 621  BLOOD PRESSURE - DIASTOLIC 75   Body  Fat % 54.1 %   ASK: We discussed the diagnosis of obesity with Alice Kemp today and Alice Kemp agreed to give [name] permission to discuss obesity behavioral modification therapy today.  ASSESS: Alice Kemp has the diagnosis of obesity and her BMI today is 43.94. Alice Kemp is in the action stage of change.   ADVISE: Alice Kemp was educated on the multiple health risks of obesity as well as the benefit of weight loss to improve her health. She was advised of the need for long term treatment and the importance of lifestyle modifications to improve her current health and to decrease her risk of future health problems.  AGREE: Multiple dietary modification options and treatment options were discussed and Eman agreed to follow the recommendations documented in the above note.  ARRANGE: Alice Kemp was educated on the importance of frequent visits to treat obesity as outlined per CMS and USPSTF guidelines and agreed to schedule her next follow up appointment today.  Lenward Chancellor, CMA, am acting as transcriptionist for  Abby Potash, Alice Kemp I, Abby Potash, Alice Kemp have reviewed above note and agree with its content

## 2018-12-15 MED ORDER — VITAMIN D (ERGOCALCIFEROL) 1.25 MG (50000 UNIT) PO CAPS: 50000.0000 [IU] | ORAL_CAPSULE | ORAL | 0 refills | Status: DC

## 2018-12-15 MED ORDER — BUPROPION HCL ER (SR) 200 MG PO TB12: 200.0000 mg | ORAL_TABLET | Freq: Every day | ORAL | 0 refills | Status: DC

## 2018-12-15 MED ORDER — ONETOUCH VERIO VI STRP: ORAL_STRIP | 0 refills | Status: DC

## 2018-12-15 MED FILL — ONETOUCH VERIO TEST STRIP: 50 days supply | Qty: 100 | Fill #0

## 2018-12-15 MED FILL — VIT D2 1.25 MG (50,000 UNIT: 1.25 MG | 30 days supply | Qty: 10 | Fill #0

## 2018-12-17 DIAGNOSIS — I10 Essential (primary) hypertension: Secondary | ICD-10-CM | POA: Diagnosis not present

## 2018-12-17 DIAGNOSIS — E11319 Type 2 diabetes mellitus with unspecified diabetic retinopathy without macular edema: Secondary | ICD-10-CM | POA: Diagnosis not present

## 2018-12-17 DIAGNOSIS — E7800 Pure hypercholesterolemia, unspecified: Secondary | ICD-10-CM | POA: Diagnosis not present

## 2018-12-17 DIAGNOSIS — E039 Hypothyroidism, unspecified: Secondary | ICD-10-CM | POA: Diagnosis not present

## 2018-12-17 DIAGNOSIS — E1140 Type 2 diabetes mellitus with diabetic neuropathy, unspecified: Secondary | ICD-10-CM | POA: Diagnosis not present

## 2018-12-17 DIAGNOSIS — N189 Chronic kidney disease, unspecified: Secondary | ICD-10-CM | POA: Diagnosis not present

## 2018-12-17 DIAGNOSIS — E1165 Type 2 diabetes mellitus with hyperglycemia: Secondary | ICD-10-CM | POA: Diagnosis not present

## 2018-12-17 DIAGNOSIS — R809 Proteinuria, unspecified: Secondary | ICD-10-CM | POA: Diagnosis not present

## 2018-12-17 MED FILL — FREESTYLE LIBRE 14 DAY READ: 30 days supply | Qty: 1 | Fill #0

## 2018-12-17 MED FILL — FREESTYLE LIBRE 14 DAY SENS: 28 days supply | Qty: 2 | Fill #0

## 2018-12-21 ENCOUNTER — Ambulatory Visit (INDEPENDENT_AMBULATORY_CARE_PROVIDER_SITE_OTHER): Payer: PPO | Admitting: Psychology

## 2018-12-28 ENCOUNTER — Ambulatory Visit (INDEPENDENT_AMBULATORY_CARE_PROVIDER_SITE_OTHER): Payer: Self-pay | Admitting: Dietician

## 2018-12-28 ENCOUNTER — Encounter (INDEPENDENT_AMBULATORY_CARE_PROVIDER_SITE_OTHER): Payer: Self-pay

## 2018-12-29 ENCOUNTER — Encounter (INDEPENDENT_AMBULATORY_CARE_PROVIDER_SITE_OTHER): Payer: Self-pay

## 2019-01-05 ENCOUNTER — Ambulatory Visit (INDEPENDENT_AMBULATORY_CARE_PROVIDER_SITE_OTHER): Payer: Self-pay | Admitting: *Deleted

## 2019-01-05 DIAGNOSIS — H8110 Benign paroxysmal vertigo, unspecified ear: Secondary | ICD-10-CM

## 2019-01-05 DIAGNOSIS — R05 Cough: Secondary | ICD-10-CM

## 2019-01-05 MED ORDER — MECLIZINE HCL 25 MG PO TABS: 12.5000 mg | ORAL_TABLET | Freq: Three times a day (TID) | ORAL | 3 refills | Status: DC | PRN

## 2019-01-05 MED FILL — MECLIZINE 25 MG TABLET: 25 | 20 days supply | Qty: 60 | Fill #0

## 2019-01-05 NOTE — Telephone Encounter (Signed)
Cumulative time during 7-day interval 6 or 7 minutes, there was not an associated office visit for this concern within a 7 day period.  Verbal consent for services obtained from patient prior to services given.  Names of all persons present for services: [name], MD, Suzanne Boron and Domingo Madeira, RN who did get information from her when she first called   Chief complaint: Vertigo  Her vertigo started 2 days ago.  She is a true spinning sensation that is severe at times.  She states the vertigo is better today.  She is okay as long as she does not have to move.  She does get a little nausea with the dizziness, but denies any vomiting.  Her appetite is decreased.  She does have a dry cough but feels this is related to allergies.  She denies any fevers, chills, wheeze, shortness of breath, headaches, numbness/tingling or weakness in her arms or legs.  She has had episodes of vertigo similar to this in the past, but her last episode was a few years ago.  She did take meclizine for this and it was very helpful.  She ideally would like a prescription for meclizine.  She otherwise feels fine.  She denies changes in her history since she was here last.  History, background, results pertinent:  Past Medical History:  Diagnosis Date  . Anxiety   . Arthritis   . Back pain   . Benign neoplasm of colon 06/29/2012  . Breast abscess 07/2011   left; s/p I&D + abx by gen surg  . Cervical radiculopathy at C6   . CKD (chronic kidney disease) stage 3, GFR 30-59 ml/min (HCC)    follows with renal  . Constipation   . Depression   . Diabetes mellitus type II   . Diastolic heart failure ([place]) 09/2013 echo  . Gall bladder inflammation   . Glaucoma   . Gout   . Hyperlipidemia   . Hypothyroidism   . Joint pain   . Kidney disease   . Leg pain   . Morbid obesity ([place])   . OBSTRUCTIVE SLEEP APNEA 12/23/2010   npsg 2012:  AHI 25/hr, severe desat to 68%.   autoset 2013: optimal pressure 13cm    . PERICARDIAL EFFUSION 12/12/2010  . Pulmonary hypertension ([place])   . Shortness of breath   . Skin lesion of breast 07/09/2012  . Swelling of extremity   . Unspecified hypothyroidism   . Vitamin B12 deficiency   . Vitamin D deficiency    @RESULTS48 @   A/P/next steps: Vertigo, likely BPPV  She has had similar episodes in the past and her symptoms are classic for BPPV.  Meclizine has been effective for her in the past and she would like a prescription for it.  Meclizine prescription sent to her pharmacy.  She will call if her symptoms change or her vertigo does not improve/resolve.  She also knows that she can call with any questions or concerns.

## 2019-01-05 NOTE — Telephone Encounter (Signed)
Pt can not do virtual visit. Patient prefers phone call over mychart if possible. How would you like this handled?

## 2019-01-05 NOTE — Telephone Encounter (Signed)
Pt reports dizziness, onset Monday. "Spinning." States positional, worse when turning head and turning in bed. States H/O "Inner ear problems." States had similar episode several years ago; placed on Meclizine which was effective.  Also reports intermittent nausea, no vomiting, no visual changes. States is staying hydrated. No new medications or dosage changes. Denied, cough, congestion. Does report left earache, "But I think there's a bump in there. Not really an ache." States BP 3 weeks ago 113/70. Reports checked pulse, "Normal, regular." Pt has iPhone, email and CB number verified. States "I'm not very Pharmacologist though."  Pt informed she would hear from practice and together determine type of appt most appropriate for her. Care advise given per protocol. Pt verbalizes understanding.  Reason for Disposition . [1] MODERATE dizziness (e.g., interferes with normal activities) AND [2] has NOT been evaluated by physician for this  (Exception: dizziness caused by heat exposure, sudden standing, or poor fluid intake)  Answer Assessment - Initial Assessment Questions 1. DESCRIPTION: "Describe your dizziness."     Spinning 2. LIGHTHEADED: "Do you feel lightheaded?" (e.g., somewhat faint, woozy, weak upon standing)     yes 3. VERTIGO: "Do you feel like either you or the room is spinning or tilting?" (i.e. vertigo)     yes 4. SEVERITY: "How bad is it?"  "Do you feel like you are going to faint?" "Can you stand and walk?"   - MILD - walking normally   - MODERATE - interferes with normal activities (e.g., work, school)    - SEVERE - unable to stand, requires support to walk, feels like passing out now.     Moderate presently 5. ONSET:  "When did the dizziness begin?"     Monday 6. AGGRAVATING FACTORS: "Does anything make it worse?" (e.g., standing, change in head position)     Turning head, turning in bed 7. HEART RATE: "Can you tell me your heart rate?" "How many beats in 15 seconds?"  (Note: not all  patients can do this)       "Steady" 8. CAUSE: "What do you think is causing the dizziness?"     Inner ear 9. RECURRENT SYMPTOM: "Have you had dizziness before?" If so, ask: "When was the last time?" "What happened that time?"     Yes, inner ear related, several years ago 10. OTHER SYMPTOMS: "Do you have any other symptoms?" (e.g., fever, chest pain, vomiting, diarrhea, bleeding) nausea  Protocols used: DIZZINESS Heidi Dach

## 2019-01-05 NOTE — Addendum Note (Signed)
Addended by: [name] on: 01/05/2019 04:00 PM   Modules accepted: Orders

## 2019-01-06 ENCOUNTER — Telehealth: Payer: Self-pay | Admitting: Internal Medicine

## 2019-01-06 MED FILL — NOVOLOG MIX 70-30 FLEXPEN S: (70-30) 100 | 30 days supply | Qty: 60 | Fill #0

## 2019-01-06 NOTE — Telephone Encounter (Signed)
Pt left a vm requesting refill for mupirocin  for itching and she has been scratching

## 2019-01-07 MED ORDER — MUPIROCIN 2 % EX OINT: 1.0000 "application " | TOPICAL_OINTMENT | Freq: Two times a day (BID) | CUTANEOUS | 0 refills | Status: DC

## 2019-01-07 NOTE — Telephone Encounter (Signed)
sent 

## 2019-01-07 NOTE — Addendum Note (Signed)
Addended by: [name] on: 01/07/2019 02:45 PM   Modules accepted: Orders

## 2019-01-07 NOTE — Telephone Encounter (Signed)
Spoke with pt and she advised she had a new puppy and their nails are sharp and they have been scratching her arms. She says that during the night she is scratching at them. Would like mupirocin sent to her pharmacy. [place].

## 2019-01-11 ENCOUNTER — Telehealth (INDEPENDENT_AMBULATORY_CARE_PROVIDER_SITE_OTHER): Payer: Self-pay | Admitting: Internal Medicine

## 2019-01-11 ENCOUNTER — Telehealth (INDEPENDENT_AMBULATORY_CARE_PROVIDER_SITE_OTHER): Payer: Self-pay | Admitting: Psychology

## 2019-01-11 ENCOUNTER — Ambulatory Visit (INDEPENDENT_AMBULATORY_CARE_PROVIDER_SITE_OTHER): Payer: Self-pay | Admitting: Physician Assistant

## 2019-01-11 DIAGNOSIS — H8112 Benign paroxysmal vertigo, left ear: Secondary | ICD-10-CM

## 2019-01-11 MED ORDER — DOXYCYCLINE HYCLATE 100 MG PO TABS: 100.0000 mg | ORAL_TABLET | Freq: Two times a day (BID) | ORAL | 0 refills | Status: DC

## 2019-01-11 NOTE — Telephone Encounter (Signed)
Cumulative time during 7-day interval 5 mintues, there was not an associated office visit for this concern within a 7 day period.  Verbal consent for services obtained from patient prior to services given.  Names of all persons present for services: [name], MD, Suzanne Boron   Chief complaint: ear pain  I spoke to her 6 days ago regarding vertigo.  She had this in the past and wanted to start meclizine.  She felt the vertigo was related to allergies.  She has been taking the meclizine and vertigo is better, but still there.  She has developed left ear pain.  The pain is severe.  She was unable to sleep last night due to the pain.  The pain radiates down to her neck.  Pulling on her ear increases the pain.  She has a runny nose and other allergy symptoms.  She is not having any fevers.  She really feels that she has an ear infection was wondering if something could be called in.  History, background, results pertinent:  Past Medical History:  Diagnosis Date  . Anxiety   . Arthritis   . Back pain   . Benign neoplasm of colon 06/29/2012  . Breast abscess 07/2011   left; s/p I&D + abx by gen surg  . Cervical radiculopathy at C6   . CKD (chronic kidney disease) stage 3, GFR 30-59 ml/min (HCC)    follows with renal  . Constipation   . Depression   . Diabetes mellitus type II   . Diastolic heart failure ([place]) 09/2013 echo  . Gall bladder inflammation   . Glaucoma   . Gout   . Hyperlipidemia   . Hypothyroidism   . Joint pain   . Kidney disease   . Leg pain   . Morbid obesity ([place])   . OBSTRUCTIVE SLEEP APNEA 12/23/2010   npsg 2012:  AHI 25/hr, severe desat to 68%.   autoset 2013: optimal pressure 13cm   . PERICARDIAL EFFUSION 12/12/2010  . Pulmonary hypertension ([place])   . Shortness of breath   . Skin lesion of breast 07/09/2012  . Swelling of extremity   . Unspecified hypothyroidism   . Vitamin B12 deficiency   . Vitamin D deficiency    @RESULTS48 @   A/P/next steps: Otitis  media  Symptoms concerning for left ear otitis media.  Will treat empirically since we are trying to keep patients out of the office due to the coronavirus situation.  Doxycycline twice daily x1 week.  She will continue her allergy medications and meclizine for vertigo.  She will call if her symptoms do not improve.

## 2019-01-11 NOTE — Telephone Encounter (Signed)
Do you want to do some type of visit again or how would you like to handle?

## 2019-01-11 NOTE — Telephone Encounter (Signed)
  Office: [phone]  /  Fax: [phone]  Date of Call: January 11, 2019 Time of Call: 3:03pm Provider: Glennie [place], PsyD  CONTENT: This provider called [place] to check-in and schedule a follow-up appointment, as the last appointment was canceled due to COVID-19 concerns. The call was forwarded; therefore, this provider left a HIPAA compliant voicemail requesting a call back.  PLAN: This provider will wait for [place] to call or send a MyChart message to schedule a follow-up appointment.

## 2019-01-11 NOTE — Telephone Encounter (Signed)
Copied from [place] [id number]. Topic: Quick Communication - See Telephone Encounter >> Jan 11, 2019  7:45 AM Robina Ade, Helene Kelp D wrote: CRM for notification. See Telephone encounter for: 01/11/19. Patient called and said that her inner ear is still hurting really bad and would like to talk to Dr. Quay Burow or her CMA about it. She is not sure if she needs an appointment or if Dr. Quay Burow can call her something in to her pharmacy. Please call patient back, thanks.

## 2019-01-31 ENCOUNTER — Telehealth (INDEPENDENT_AMBULATORY_CARE_PROVIDER_SITE_OTHER): Payer: Self-pay | Admitting: Psychology

## 2019-01-31 NOTE — Telephone Encounter (Signed)
  Office: [phone]  /  Fax: [phone]  Date of Call: January 31, 2019 Time of Call: 9:06am Duration of Call: 8 minutes Provider: Glennie Isle, PsyD  CONTENT: This provider called Alice Kemp to check-in and discuss the option of a WebEx appointment. Alice Kemp reported, "I'm good" and was receptive to scheduling an appointment. As such, this provider assisted Alice Kemp in downloading the WebEx app on her phone, and explained the steps to join the meeting the day of the follow-up appointment. This provider also explained that in addition to the secure link that will be sent via e-mail, this provider will also send a consent form for telepsychological services. Alice Kemp explained she does not have a working Personal assistant, but shared she would read the consent. Verbal consent will be obtained at the time of the appointment. A brief risk assessment was completed. Alice Kemp denied experiencing suicidal and homicidal ideation, plan, and intent since the last appointment with this provider.   PLAN: Alice Kemp is scheduled for a follow-up appointment via WebEx on Feb 07, 2019 at 11:00am.

## 2019-02-07 ENCOUNTER — Ambulatory Visit (INDEPENDENT_AMBULATORY_CARE_PROVIDER_SITE_OTHER): Payer: Self-pay | Admitting: Psychology

## 2019-02-24 ENCOUNTER — Ambulatory Visit (INDEPENDENT_AMBULATORY_CARE_PROVIDER_SITE_OTHER): Payer: PPO | Admitting: Physician Assistant

## 2019-02-24 ENCOUNTER — Other Ambulatory Visit: Payer: Self-pay

## 2019-02-24 ENCOUNTER — Encounter (INDEPENDENT_AMBULATORY_CARE_PROVIDER_SITE_OTHER): Payer: Self-pay | Admitting: Physician Assistant

## 2019-02-24 DIAGNOSIS — E559 Vitamin D deficiency, unspecified: Secondary | ICD-10-CM

## 2019-02-24 DIAGNOSIS — E119 Type 2 diabetes mellitus without complications: Secondary | ICD-10-CM | POA: Diagnosis not present

## 2019-02-24 DIAGNOSIS — Z794 Long term (current) use of insulin: Secondary | ICD-10-CM

## 2019-02-24 DIAGNOSIS — R7303 Prediabetes: Secondary | ICD-10-CM

## 2019-02-24 MED ORDER — VITAMIN D (ERGOCALCIFEROL) 1.25 MG (50000 UNIT) PO CAPS: 50000.0000 [IU] | ORAL_CAPSULE | ORAL | 0 refills | Status: DC

## 2019-03-01 NOTE — Progress Notes (Signed)
Office: [phone]  /  Fax: [phone] TeleHealth Visit:  Alice Kemp has verbally consented to this TeleHealth visit today. The patient is located at home, the provider is located at the [employer] [facility] office. The participants in this visit include the listed provider and patient. The visit was conducted today via telephone call.  HPI:   Chief Complaint: OBESITY Alice Kemp is here to discuss her progress with her obesity treatment plan. She is on the Category 3 plan and is following her eating plan approximately 35% of the time. She states she is exercising 0 minutes 0 times per week. Alice Kemp reports that she has not been following the plan very well, especially because she is unable to find meat at the grocery store. She has lost her motivation and states that her appetite has decreased. We were unable to weigh the patient today for this TeleHealth visit. She feels as if she has gained 5 lbs since her last visit. She has lost 19 lbs since starting treatment with [name].  Vitamin D deficiency Alice Kemp has a diagnosis of Vitamin D deficiency. She is currently taking prescription Vit D and denies nausea, vomiting or muscle weakness.  Pre-Diabetes Alice Kemp has a diagnosis of prediabetes based on her elevated Hgb A1c and was informed this puts her at greater risk of developing diabetes. She states her fasting blood sugars average 183. She is on NovoLog and does not report hypoglycemia. Alice Kemp continues to work on diet and exercise to decrease risk of diabetes.   ASSESSMENT AND PLAN:  Vitamin D deficiency - Plan: Vitamin D, Ergocalciferol, (DRISDOL) 1.25 MG (50000 UT) CAPS capsule  Prediabetes  Type 2 diabetes mellitus without complication, with long-term current use of insulin (HCC)  PLAN:  Vitamin D Deficiency Alice Kemp was informed that low Vitamin D levels contributes to fatigue and are associated with obesity, breast, and colon cancer. She agrees to continue to take prescription  Vit D @ 50,000 IU every three days #10 with 0 refills and will follow-up for routine testing of Vitamin D, at least 2-3 times per year. She was informed of the risk of over-replacement of Vitamin D and agrees to not increase her dose unless she discusses this with [name] first. Alice Kemp agrees to follow-up with our clinic in 2 weeks.  Pre-Diabetes Alice Kemp will continue to work on weight loss, exercise, and decreasing simple carbohydrates in her diet to help decrease the risk of diabetes. We dicussed metformin including benefits and risks. She was informed that eating too many simple carbohydrates or too many calories at one sitting increases the likelihood of GI side effects. Alice Kemp will continue her medications and weight loss. She will follow-up with [name] as directed to monitor her progress.  Obesity Alice Kemp is currently in the action stage of change. As such, her goal is to continue with weight loss efforts. She has agreed to follow the Category 3 plan. Alice Kemp has been instructed to work up to a goal of 150 minutes of combined cardio and strengthening exercise per week for weight loss and overall health benefits. We discussed the following Behavioral Modification Strategies today: increasing lean protein intake, work on meal planning and easy cooking plans.  Alice Kemp has agreed to follow-up with our clinic in 2 weeks. She was informed of the importance of frequent follow-up visits to maximize her success with intensive lifestyle modifications for her multiple health conditions.  ALLERGIES: Allergies  Allergen Reactions  . Sulfa Antibiotics   . Penicillins Rash    MEDICATIONS: Current  Outpatient Medications on File Prior to Visit  Medication Sig Dispense Refill  . allopurinol (ZYLOPRIM) 100 MG tablet TAKE 1 TABLET BY MOUTH ONCE DAILY 90 tablet 1  . Alpha-Lipoic Acid 100 MG CAPS Take by mouth.    Marland Kitchen amitriptyline (ELAVIL) 10 MG tablet at bedtime as needed.   5  . aspirin 81 MG tablet Take 81 mg by  mouth daily. Reported on 12/24/2015    . buPROPion (WELLBUTRIN SR) 200 MG 12 hr tablet Take 1 tablet (200 mg total) by mouth daily. 30 tablet 0  . celecoxib (CELEBREX) 100 MG capsule Take 1 capsule (100 mg total) by mouth daily as needed. Needs office visit for more refills 30 capsule 0  . Coenzyme Q10 (EQL COQ10) 300 MG CAPS Take 1 capsule (300 mg total) daily at 12 noon by mouth. 30 capsule 0  . colchicine 0.6 MG tablet TAKE 2 TABLETS BY MOUTH AT ONSET OF FLARE THEN TAKE 1 TABLET 1 HOUR LATER 15 tablet 0  . doxycycline (VIBRA-TABS) 100 MG tablet Take 1 tablet (100 mg total) by mouth 2 (two) times daily. 14 tablet 0  . DULoxetine (CYMBALTA) 30 MG capsule Take 1 capsule (30 mg total) by mouth 2 (two) times daily. 180 capsule 1  . fluticasone (CUTIVATE) 0.05 % cream APPLY 3 TIMES A DAY AS NEEDED FOR ITCHING 60 g 1  . furosemide (LASIX) 80 MG tablet Take 1 tablet (80 mg total) by mouth daily. 90 tablet 3  . gabapentin (NEURONTIN) 300 MG capsule 300 mg 3 (three) times daily.     Marland Kitchen levocetirizine (XYZAL) 5 MG tablet TAKE 1 TABLET BY MOUTH EVERY EVENING 90 tablet 3  . levothyroxine (SYNTHROID, LEVOTHROID) 88 MCG tablet Take 88 mcg by mouth daily before breakfast.    . losartan (COZAAR) 25 MG tablet Take 1 tablet (25 mg total) by mouth daily. -- Office visit needed for further refills 90 tablet 0  . Magnesium 100 MG CAPS Take 2 capsules by mouth daily.    . meclizine (ANTIVERT) 25 MG tablet Take 0.5-1 tablets (12.5-25 mg total) by mouth 3 (three) times daily as needed for dizziness. 60 tablet 3  . mupirocin ointment (BACTROBAN) 2 % Apply 1 application topically 2 (two) times daily. 30 g 0  . NOVOLOG MIX 70/30 (70-30) 100 UNIT/ML injection Inject into the skin. 85 units breakfast , 55 units lunch and dinner    . ONETOUCH VERIO test strip Check BS twice daily 100 each 0  . rosuvastatin (CRESTOR) 5 MG tablet Take 1 tablet (5 mg total) by mouth daily. 90 tablet 3  . spironolactone (ALDACTONE) 25 MG tablet  TAKE 1 TABLET (25 MG TOTAL) BY MOUTH DAILY. 90 tablet 1  . traMADol (ULTRAM) 50 MG tablet Take by mouth every 12 (twelve) hours as needed.    . vitamin B-12 (CYANOCOBALAMIN) 500 MCG tablet Take 500 mcg by mouth 2 (two) times daily.     No current facility-administered medications on file prior to visit.     PAST MEDICAL HISTORY: Past Medical History:  Diagnosis Date  . Anxiety   . Arthritis   . Back pain   . Benign neoplasm of colon 06/29/2012  . Breast abscess 07/2011   left; s/p I&D + abx by gen surg  . Cervical radiculopathy at C6   . CKD (chronic kidney disease) stage 3, GFR 30-59 ml/min (HCC)    follows with renal  . Constipation   . Depression   . Diabetes mellitus type II   .  Diastolic heart failure ([place]) 09/2013 echo  . Gall bladder inflammation   . Glaucoma   . Gout   . Hyperlipidemia   . Hypothyroidism   . Joint pain   . Kidney disease   . Leg pain   . Morbid obesity ([place])   . OBSTRUCTIVE SLEEP APNEA 12/23/2010   npsg 2012:  AHI 25/hr, severe desat to 68%.   autoset 2013: optimal pressure 13cm   . PERICARDIAL EFFUSION 12/12/2010  . Pulmonary hypertension ([place])   . Shortness of breath   . Skin lesion of breast 07/09/2012  . Swelling of extremity   . Unspecified hypothyroidism   . Vitamin B12 deficiency   . Vitamin D deficiency     PAST SURGICAL HISTORY: Past Surgical History:  Procedure Laterality Date  . BREAST BIOPSY  1990   Left  . CATARACT EXTRACTION    . COLONOSCOPY  06/29/2012   Procedure: COLONOSCOPY;  Surgeon: Ladene Artist, MD,FACG;  Location: [facility];  Service: Endoscopy;  Laterality: N/A;  . SHOULDER SURGERY  2010   Left  . TONSILLECTOMY  1964    SOCIAL HISTORY: Social History   Tobacco Use  . Smoking status: Never Smoker  . Smokeless tobacco: Never Used  Substance Use Topics  . Alcohol use: No    Alcohol/week: 0.0 standard drinks    Frequency: Never  . Drug use: No    FAMILY HISTORY: Family History  Problem Relation Age of  Onset  . Hypertension Mother   . Heart disease Mother   . Heart attack Mother   . Thyroid disease Mother   . Diabetes Father   . Heart disease Father   . Skin cancer Father   . Cancer Father        skin  . Heart attack Father   . Hyperlipidemia Father   . Colon polyps Father   . Depression Father   . Hypertension Brother   . Skin cancer Paternal Grandfather   . Colon cancer Paternal Grandfather 68  . Breast cancer Paternal Aunt   . Cancer Paternal Aunt        breast  . Diabetes Paternal Uncle   . Diabetes Maternal Grandfather   . Sudden death Neg Hx    ROS: Review of Systems  Gastrointestinal: Negative for nausea and vomiting.  Musculoskeletal:       Negative for muscle weakness.  Endo/Heme/Allergies:       Negative for hypoglycemia.   PHYSICAL EXAM: Pt in no acute distress  RECENT LABS AND TESTS: BMET    Component Value Date/Time   NA 137 10/14/2018 1428   K 4.9 10/14/2018 1428   CL 100 10/14/2018 1428   CO2 18 (L) 10/14/2018 1428   GLUCOSE 437 (H) 10/14/2018 1428   GLUCOSE 130 (H) 01/08/2016 1649   BUN 18 10/14/2018 1428   CREATININE 1.64 (H) 10/14/2018 1428   CALCIUM 9.1 10/14/2018 1428   GFRNONAA 32 (L) 10/14/2018 1428   GFRAA 37 (L) 10/14/2018 1428   Lab Results  Component Value Date   HGBA1C 9.9 (H) 05/17/2018   HGBA1C 8.8 (H) 12/17/2017   HGBA1C 7.9 (H) 07/30/2017   HGBA1C 8.1 (H) 04/10/2015   HGBA1C 10.0 (H) 04/11/2014   Lab Results  Component Value Date   INSULIN 9.8 05/17/2018   INSULIN 31.9 (H) 12/17/2017   INSULIN 37.2 (H) 07/30/2017   CBC    Component Value Date/Time   WBC 10.7 10/14/2018 1428   WBC 8.9 09/23/2013 1149   RBC 4.33  10/14/2018 1428   RBC 4.58 09/23/2013 1149   HGB 12.8 10/14/2018 1428   HCT 40.2 10/14/2018 1428   PLT 213 12/15/2016   MCV 93 10/14/2018 1428   MCH 29.6 10/14/2018 1428   MCH 28.1 01/29/2011 0500   MCHC 31.8 10/14/2018 1428   MCHC 33.0 09/23/2013 1149   RDW 12.8 10/14/2018 1428   LYMPHSABS 3.3 (H)  10/14/2018 1428   MONOABS 0.6 09/23/2013 1149   EOSABS 0.3 10/14/2018 1428   BASOSABS 0.1 10/14/2018 1428   Iron/TIBC/Ferritin/ %Sat No results found for: IRON, TIBC, FERRITIN, IRONPCTSAT Lipid Panel     Component Value Date/Time   CHOL 220 (H) 10/14/2018 1428   TRIG 344 (H) 10/14/2018 1428   HDL 31 (L) 10/14/2018 1428   CHOLHDL 4 04/10/2015 1212   VLDL 38.8 04/10/2015 1212   LDLCALC 120 (H) 10/14/2018 1428   LDLDIRECT 54.6 05/10/2012 1440   Hepatic Function Panel     Component Value Date/Time   PROT 7.5 10/14/2018 1428   ALBUMIN 3.7 10/14/2018 1428   AST 12 10/14/2018 1428   ALT 14 10/14/2018 1428   ALKPHOS 84 10/14/2018 1428   BILITOT 0.3 10/14/2018 1428   BILIDIR 0.1 05/10/2012 1440      Component Value Date/Time   TSH 1.850 10/14/2018 1428   TSH 2.610 07/30/2017 1020   TSH 1.62 04/10/2015 1212   Results for Linsley, KAITLIN ALCINDOR (MRN [id number]) as of 03/01/2019 10:59  Ref. Range 10/14/2018 14:28  Vitamin D, 25-Hydroxy Latest Ref Range: 30.0 - 100.0 ng/mL 29.1 (L)    I, Michaelene Song, am acting as Location manager for [employer], PA-C I, Abby Potash, PA-C have reviewed above note and agree with its content

## 2019-03-08 ENCOUNTER — Ambulatory Visit: Payer: Self-pay | Admitting: Internal Medicine

## 2019-03-14 ENCOUNTER — Other Ambulatory Visit: Payer: Self-pay

## 2019-03-14 ENCOUNTER — Ambulatory Visit (INDEPENDENT_AMBULATORY_CARE_PROVIDER_SITE_OTHER): Payer: PPO | Admitting: Physician Assistant

## 2019-03-14 ENCOUNTER — Encounter (INDEPENDENT_AMBULATORY_CARE_PROVIDER_SITE_OTHER): Payer: Self-pay | Admitting: Physician Assistant

## 2019-03-14 DIAGNOSIS — Z6841 Body Mass Index (BMI) 40.0 and over, adult: Secondary | ICD-10-CM

## 2019-03-14 DIAGNOSIS — E559 Vitamin D deficiency, unspecified: Secondary | ICD-10-CM

## 2019-03-14 DIAGNOSIS — Z794 Long term (current) use of insulin: Secondary | ICD-10-CM | POA: Diagnosis not present

## 2019-03-14 DIAGNOSIS — E119 Type 2 diabetes mellitus without complications: Secondary | ICD-10-CM | POA: Diagnosis not present

## 2019-03-14 NOTE — Progress Notes (Signed)
Office: [phone]  /  Fax: [phone] TeleHealth Visit:  Alice Kemp has verbally consented to this TeleHealth visit today. The patient is located at home, the provider is located at the [employer] [facility] office. The participants in this visit include the listed provider and patient and any and all parties involved. The visit was conducted today via telephone. Katisha was unable to use realtime audiovisual technology today and the telehealth visit was conducted via telephone.  HPI:   Chief Complaint: OBESITY Alice Kemp is here to discuss her progress with her obesity treatment plan. She is on the Category 3 plan and is following her eating plan approximately 30 % of the time. She states she is exercising 0 minutes 0 times per week. [name] reports that she has completely lost motivation to be on the plan. She is only eating once daily, as she does not want to get up and cook, due to her back pain. We were unable to weigh the patient today for this TeleHealth visit. She feels as if she has gained weight since her last visit.   Vitamin D deficiency [name] has a diagnosis of vitamin D deficiency. She is currently taking vit D and denies nausea, vomiting or muscle weakness.  Diabetes II Alice Kemp has a diagnosis of diabetes type II. Alice Kemp reports she has not been checking her blood sugars. She is not taking insulin like she should, because she can't afford it. Last A1c was at 9.9 She has been working on intensive lifestyle modifications including diet, exercise, and weight loss to help control her blood glucose levels.  ASSESSMENT AND PLAN:  No diagnosis found.  PLAN:  Vitamin D Deficiency Alice Kemp was informed that low vitamin D levels contributes to fatigue and are associated with obesity, breast, and colon cancer. She agrees to continue to take prescription Vit D @50 ,000 IU every 3 days #10 with no refills and will follow up for routine testing of vitamin D, at least 2-3 times per  year. She was informed of the risk of over-replacement of vitamin D and agrees to not increase her dose unless she discusses this with [name] first. Alice Kemp agrees   Diabetes II Alice Kemp has been given extensive diabetes education by myself today including ideal fasting and post-prandial blood glucose readings, individual ideal Hgb A1c goals and hypoglycemia prevention. We discussed the importance of good blood sugar control to decrease the likelihood of diabetic complications such as nephropathy, neuropathy, limb loss, blindness, coronary artery disease, and death. We discussed the importance of intensive lifestyle modification including diet, exercise and weight loss as the first line treatment for diabetes. She will continue with weight loss and  [name] agrees to continue her diabetes medications and follow up at the agreed upon time.  Obesity Alice Kemp is currently in the action stage of change. As such, her goal is to continue with weight loss efforts She has agreed to follow the Category 3 plan Alice Kemp has been instructed to work up to a goal of 150 minutes of combined cardio and strengthening exercise per week for weight loss and overall health benefits. We discussed the following Behavioral Modification Strategies today: increasing lean protein intake and no skipping meals  Alice Kemp has agreed to follow up with our clinic in 2 weeks. She was informed of the importance of frequent follow up visits to maximize her success with intensive lifestyle modifications for her multiple health conditions.  ALLERGIES: Allergies  Allergen Reactions  . Sulfa Antibiotics   . Penicillins Rash  MEDICATIONS: Current Outpatient Medications on File Prior to Visit  Medication Sig Dispense Refill  . allopurinol (ZYLOPRIM) 100 MG tablet TAKE 1 TABLET BY MOUTH ONCE DAILY 90 tablet 1  . Alpha-Lipoic Acid 100 MG CAPS Take by mouth.    Alice Kemp amitriptyline (ELAVIL) 10 MG tablet at bedtime as needed.   5  . aspirin 81 MG  tablet Take 81 mg by mouth daily. Reported on 12/24/2015    . buPROPion (WELLBUTRIN SR) 200 MG 12 hr tablet Take 1 tablet (200 mg total) by mouth daily. 30 tablet 0  . celecoxib (CELEBREX) 100 MG capsule Take 1 capsule (100 mg total) by mouth daily as needed. Needs office visit for more refills 30 capsule 0  . Coenzyme Q10 (EQL COQ10) 300 MG CAPS Take 1 capsule (300 mg total) daily at 12 noon by mouth. 30 capsule 0  . colchicine 0.6 MG tablet TAKE 2 TABLETS BY MOUTH AT ONSET OF FLARE THEN TAKE 1 TABLET 1 HOUR LATER 15 tablet 0  . DULoxetine (CYMBALTA) 30 MG capsule Take 1 capsule (30 mg total) by mouth 2 (two) times daily. 180 capsule 1  . fluticasone (CUTIVATE) 0.05 % cream APPLY 3 TIMES A DAY AS NEEDED FOR ITCHING 60 g 1  . furosemide (LASIX) 80 MG tablet Take 1 tablet (80 mg total) by mouth daily. 90 tablet 3  . gabapentin (NEURONTIN) 300 MG capsule 300 mg 3 (three) times daily.     Alice Kemp levocetirizine (XYZAL) 5 MG tablet TAKE 1 TABLET BY MOUTH EVERY EVENING 90 tablet 3  . levothyroxine (SYNTHROID, LEVOTHROID) 88 MCG tablet Take 88 mcg by mouth daily before breakfast.    . losartan (COZAAR) 25 MG tablet Take 1 tablet (25 mg total) by mouth daily. -- Office visit needed for further refills 90 tablet 0  . Magnesium 100 MG CAPS Take 2 capsules by mouth daily.    . meclizine (ANTIVERT) 25 MG tablet Take 0.5-1 tablets (12.5-25 mg total) by mouth 3 (three) times daily as needed for dizziness. 60 tablet 3  . mupirocin ointment (BACTROBAN) 2 % Apply 1 application topically 2 (two) times daily. 30 g 0  . NOVOLOG MIX 70/30 (70-30) 100 UNIT/ML injection Inject into the skin. 85 units breakfast , 55 units lunch and dinner    . ONETOUCH VERIO test strip Check BS twice daily 100 each 0  . rosuvastatin (CRESTOR) 5 MG tablet Take 1 tablet (5 mg total) by mouth daily. 90 tablet 3  . spironolactone (ALDACTONE) 25 MG tablet TAKE 1 TABLET (25 MG TOTAL) BY MOUTH DAILY. 90 tablet 1  . traMADol (ULTRAM) 50 MG tablet Take  by mouth every 12 (twelve) hours as needed.    . vitamin B-12 (CYANOCOBALAMIN) 500 MCG tablet Take 500 mcg by mouth 2 (two) times daily.    . Vitamin D, Ergocalciferol, (DRISDOL) 1.25 MG (50000 UT) CAPS capsule Take 1 capsule (50,000 Units total) by mouth every 3 (three) days. 10 capsule 0  . doxycycline (VIBRA-TABS) 100 MG tablet Take 1 tablet (100 mg total) by mouth 2 (two) times daily. (Patient not taking: Reported on 03/14/2019) 14 tablet 0   No current facility-administered medications on file prior to visit.     PAST MEDICAL HISTORY: Past Medical History:  Diagnosis Date  . Anxiety   . Arthritis   . Back pain   . Benign neoplasm of colon 06/29/2012  . Breast abscess 07/2011   left; s/p I&D + abx by gen surg  . Cervical radiculopathy at  C6   . CKD (chronic kidney disease) stage 3, GFR 30-59 ml/min (HCC)    follows with renal  . Constipation   . Depression   . Diabetes mellitus type II   . Diastolic heart failure ([place]) 09/2013 echo  . Gall bladder inflammation   . Glaucoma   . Gout   . Hyperlipidemia   . Hypothyroidism   . Joint pain   . Kidney disease   . Leg pain   . Morbid obesity ([place])   . OBSTRUCTIVE SLEEP APNEA 12/23/2010   npsg 2012:  AHI 25/hr, severe desat to 68%.   autoset 2013: optimal pressure 13cm   . PERICARDIAL EFFUSION 12/12/2010  . Pulmonary hypertension ([place])   . Shortness of breath   . Skin lesion of breast 07/09/2012  . Swelling of extremity   . Unspecified hypothyroidism   . Vitamin B12 deficiency   . Vitamin D deficiency     PAST SURGICAL HISTORY: Past Surgical History:  Procedure Laterality Date  . BREAST BIOPSY  1990   Left  . CATARACT EXTRACTION    . COLONOSCOPY  06/29/2012   Procedure: COLONOSCOPY;  Surgeon: Ladene Artist, MD,FACG;  Location: [facility];  Service: Endoscopy;  Laterality: N/A;  . SHOULDER SURGERY  2010   Left  . TONSILLECTOMY  1964    SOCIAL HISTORY: Social History   Tobacco Use  . Smoking status: Never Smoker  .  Smokeless tobacco: Never Used  Substance Use Topics  . Alcohol use: No    Alcohol/week: 0.0 standard drinks    Frequency: Never  . Drug use: No    FAMILY HISTORY: Family History  Problem Relation Age of Onset  . Hypertension Mother   . Heart disease Mother   . Heart attack Mother   . Thyroid disease Mother   . Diabetes Father   . Heart disease Father   . Skin cancer Father   . Cancer Father        skin  . Heart attack Father   . Hyperlipidemia Father   . Colon polyps Father   . Depression Father   . Hypertension Brother   . Skin cancer Paternal Grandfather   . Colon cancer Paternal Grandfather 49  . Breast cancer Paternal Aunt   . Cancer Paternal Aunt        breast  . Diabetes Paternal Uncle   . Diabetes Maternal Grandfather   . Sudden death Neg Hx     ROS: Review of Systems  Constitutional: Negative for weight loss.  Gastrointestinal: Negative for nausea and vomiting.  Musculoskeletal: Positive for back pain.       Negative for muscle weakness    PHYSICAL EXAM: Pt in no acute distress  RECENT LABS AND TESTS: BMET    Component Value Date/Time   NA 137 10/14/2018 1428   K 4.9 10/14/2018 1428   CL 100 10/14/2018 1428   CO2 18 (L) 10/14/2018 1428   GLUCOSE 437 (H) 10/14/2018 1428   GLUCOSE 130 (H) 01/08/2016 1649   BUN 18 10/14/2018 1428   CREATININE 1.64 (H) 10/14/2018 1428   CALCIUM 9.1 10/14/2018 1428   GFRNONAA 32 (L) 10/14/2018 1428   GFRAA 37 (L) 10/14/2018 1428   Lab Results  Component Value Date   HGBA1C 9.9 (H) 05/17/2018   HGBA1C 8.8 (H) 12/17/2017   HGBA1C 7.9 (H) 07/30/2017   HGBA1C 8.1 (H) 04/10/2015   HGBA1C 10.0 (H) 04/11/2014   Lab Results  Component Value Date   INSULIN 9.8  05/17/2018   INSULIN 31.9 (H) 12/17/2017   INSULIN 37.2 (H) 07/30/2017   CBC    Component Value Date/Time   WBC 10.7 10/14/2018 1428   WBC 8.9 09/23/2013 1149   RBC 4.33 10/14/2018 1428   RBC 4.58 09/23/2013 1149   HGB 12.8 10/14/2018 1428   HCT 40.2  10/14/2018 1428   PLT 213 12/15/2016   MCV 93 10/14/2018 1428   MCH 29.6 10/14/2018 1428   MCH 28.1 01/29/2011 0500   MCHC 31.8 10/14/2018 1428   MCHC 33.0 09/23/2013 1149   RDW 12.8 10/14/2018 1428   LYMPHSABS 3.3 (H) 10/14/2018 1428   MONOABS 0.6 09/23/2013 1149   EOSABS 0.3 10/14/2018 1428   BASOSABS 0.1 10/14/2018 1428   Iron/TIBC/Ferritin/ %Sat No results found for: IRON, TIBC, FERRITIN, IRONPCTSAT Lipid Panel     Component Value Date/Time   CHOL 220 (H) 10/14/2018 1428   TRIG 344 (H) 10/14/2018 1428   HDL 31 (L) 10/14/2018 1428   CHOLHDL 4 04/10/2015 1212   VLDL 38.8 04/10/2015 1212   LDLCALC 120 (H) 10/14/2018 1428   LDLDIRECT 54.6 05/10/2012 1440   Hepatic Function Panel     Component Value Date/Time   PROT 7.5 10/14/2018 1428   ALBUMIN 3.7 10/14/2018 1428   AST 12 10/14/2018 1428   ALT 14 10/14/2018 1428   ALKPHOS 84 10/14/2018 1428   BILITOT 0.3 10/14/2018 1428   BILIDIR 0.1 05/10/2012 1440      Component Value Date/Time   TSH 1.850 10/14/2018 1428   TSH 2.610 07/30/2017 1020   TSH 1.62 04/10/2015 1212    Results for Critz, AMORITA VANROSSUM (MRN [id number]) as of 03/14/2019 13:53  Ref. Range 10/14/2018 14:28  Vitamin D, 25-Hydroxy Latest Ref Range: 30.0 - 100.0 ng/mL 29.1 (L)    I, Doreene Nest, am acting as transcriptionist for Abby Potash, PA-C I, Abby Potash, PA-C have reviewed above note and agree with its content

## 2019-03-15 MED ORDER — VITAMIN D (ERGOCALCIFEROL) 1.25 MG (50000 UNIT) PO CAPS: 50000.0000 [IU] | ORAL_CAPSULE | ORAL | 0 refills | Status: DC

## 2019-03-15 MED FILL — VIT D2 1.25 MG (50,000 UNIT: 1.25 MG | 30 days supply | Qty: 10 | Fill #0

## 2019-03-28 ENCOUNTER — Ambulatory Visit (INDEPENDENT_AMBULATORY_CARE_PROVIDER_SITE_OTHER): Payer: Self-pay | Admitting: Internal Medicine

## 2019-03-28 ENCOUNTER — Telehealth (INDEPENDENT_AMBULATORY_CARE_PROVIDER_SITE_OTHER): Payer: PPO | Admitting: Physician Assistant

## 2019-03-28 ENCOUNTER — Ambulatory Visit: Payer: Self-pay | Admitting: Internal Medicine

## 2019-03-28 ENCOUNTER — Telehealth: Payer: Self-pay | Admitting: Emergency Medicine

## 2019-03-28 ENCOUNTER — Encounter: Payer: Self-pay | Admitting: Internal Medicine

## 2019-03-28 DIAGNOSIS — Z20822 Contact with and (suspected) exposure to covid-19: Secondary | ICD-10-CM | POA: Insufficient documentation

## 2019-03-28 DIAGNOSIS — R6889 Other general symptoms and signs: Secondary | ICD-10-CM

## 2019-03-28 NOTE — Telephone Encounter (Signed)
Found out  this morning my mother has COVID-19 and now I have the symptoms since Thursday.   I was around her all last week. I don't want to be tested because I get so short of breath when I have to go out.  I went over the isolation stuff with her and about staying home, etc.   She was agreeable to this plan.  I warm transferred the call to [facility] at Dr. Eilleen Kempf office.    Reason for Disposition . HIGH RISK patient (e.g., age > 9 years, diabetes, heart or lung disease, weak immune system)  Answer Assessment - Initial Assessment Questions 1. CLOSE CONTACT: "Who is the person with the confirmed or suspected COVID-19 infection that you were exposed to?"     My mother has COVID-18 and I've been exposed to her now I'm having symptoms.   Cough, shortness of breath, runny nose, no body aches or chills, no change in taste or smell.   2. PLACE of CONTACT: "Where were you when you were exposed to COVID-19?" (e.g., home, school, medical waiting room; which city?)     My mother all last week who is positive for the COVID-19 3. TYPE of CONTACT: "How much contact was there?" (e.g., sitting next to, live in same house, work in same office, same building)     See above 4. DURATION of CONTACT: "How long were you in contact with the COVID-19 patient?" (e.g., a few seconds, passed by person, a few minutes, live with the patient)     All last week 5. DATE of CONTACT: "When did you have contact with a COVID-19 patient?" (e.g., how many days ago)     All last week. 6. TRAVEL: "Have you traveled out of the country recently?" If so, "When and where?"     * Also ask about out-of-state travel, since the CDC has identified some high-risk cities for community spread in the [name].     * Note: Travel becomes less relevant if there is widespread community transmission where the patient lives.     No 7. COMMUNITY SPREAD: "Are there lots of cases of COVID-19 (community spread) where you live?" (See public health department  website, if unsure)       Yes major 8. SYMPTOMS: "Do you have any symptoms?" (e.g., fever, cough, breathing difficulty)     See above 9. PREGNANCY OR POSTPARTUM: "Is there any chance you are pregnant?" "When was your last menstrual period?" "Did you deliver in the last 2 weeks?"     Not asked due to age 68. HIGH RISK: "Do you have any heart or lung problems? Do you have a weak immune system?" (e.g., CHF, COPD, asthma, HIV positive, chemotherapy, renal failure, diabetes mellitus, sickle cell anemia)       I have kidney failure, diabetes, CHF  Answer Assessment - Initial Assessment Questions 1. COVID-19 DIAGNOSIS: "Who made your Coronavirus (COVID-19) diagnosis?" "Was it confirmed by a positive lab test?" If not diagnosed by a HCP, ask "Are there lots of cases (community spread) where you live?" (See public health department website, if unsure)     I was exposed to my mother last week who is positive for COVID-19. 2. ONSET: "When did the COVID-19 symptoms start?"      Started last Thursday. 3. WORST SYMPTOM: "What is your worst symptom?" (e.g., cough, fever, shortness of breath, muscle aches)     Shortness of breath and the dry cough. 4. COUGH: "Do you have a cough?" If so,  ask: "How bad is the cough?"       Yes dry cough 5. FEVER: "Do you have a fever?" If so, ask: "What is your temperature, how was it measured, and when did it start?"     No 6. RESPIRATORY STATUS: "Describe your breathing?" (e.g., shortness of breath, wheezing, unable to speak)      Shortness of breath is worse than normal. 7. BETTER-SAME-WORSE: "Are you getting better, staying the same or getting worse compared to yesterday?"  If getting worse, ask, "In what way?"     Same 8. HIGH RISK DISEASE: "Do you have any chronic medical problems?" (e.g., asthma, heart or lung disease, weak immune system, etc.)     Kidney disease, diabetes, CHF 9. PREGNANCY: "Is there any chance you are pregnant?" "When was your last menstrual  period?"     N/A2 10. OTHER SYMPTOMS: "Do you have any other symptoms?"  (e.g., chills, fatigue, headache, loss of smell or taste, muscle pain, sore throat)       Headaches, no change in taste of smell.  Protocols used: CORONAVIRUS ([id number]-08) DIAGNOSED OR SUSPECTED-A-AH, CORONAVIRUS (COVID-19) EXPOSURE-A-AH

## 2019-03-28 NOTE — Telephone Encounter (Signed)
Set up virtual

## 2019-03-28 NOTE — Telephone Encounter (Signed)
Pt called and states that her mother tested positive for COVID. Pt is also having symptoms and states that she does not feel like getting dressed and ready to go out of the house to be tested due to difficulty breathing. Pt would like to know if she needs to do a telephone visit. Pt would also like to know if she can take mucinex.

## 2019-03-28 NOTE — Telephone Encounter (Signed)
LVM for pt to call back and schedule a virtual visit.

## 2019-03-28 NOTE — Progress Notes (Signed)
Virtual Visit via telephone note  I connected with Alice Kemp on 03/28/19 at  1:45 PM EDT by telephone and verified that I am speaking with the correct person using two identifiers.   I discussed the limitations of evaluation and management by telemedicine and the availability of in person appointments. The patient expressed understanding and agreed to proceed.  The patient is currently at home and I am in the office.    No referring provider.    History of Present Illness: This is an acute visit for COVID symptoms  Wednesday, 2 weeks ago or 12 days ago, her mother got her hair done.  A few days later, Saturday she started feeling SOB and was not feeling well.  She started feeling bad on Sunday.  Her mom got progressively worse this week and she was tested for COVID and they found out today she is positive.     Her symptoms started just over one week ago.    She is mostly experiencing occasional chills, runny nose, hoarseness, dry cough and shortness of breath.  She has had some diarrhea and headaches.  She takes Tylenol regularly for back pain.  She wondered if she could take Mucinex.  She does not want to go get tested.  Review of Systems  Constitutional: Positive for chills. Negative for fever.  HENT: Negative for congestion and sore throat.        Runny nose, hoarseness  Respiratory: Positive for cough and shortness of breath. Negative for wheezing.   Gastrointestinal: Positive for diarrhea.  Neurological: Positive for headaches.      Social History   Socioeconomic History  . Marital status: Single    Spouse name: Not on file  . Number of children: 0  . Years of education: Not on file  . Highest education level: Not on file  Occupational History  . 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 Financial resource strain: Somewhat hard  . Food insecurity    Worry: Never true    Inability:  Never true  . Transportation needs    Medical: No    Non-medical: No  Tobacco Use  . Smoking status: Never Smoker  . Smokeless tobacco: Never Used  Substance and Sexual Activity  . Alcohol use: No    Alcohol/week: 0.0 standard drinks    Frequency: Never  . Drug use: No  . Sexual activity: Not on file  Lifestyle  . Physical activity    Days per week: 0 days    Minutes per session: 0 min  . Stress: Not on file  Relationships  . Social Herbalist on phone: Not on file    Gets together: Not on file    Attends religious service: Not on file    Active member of club or organization: Not on file    Attends meetings of clubs or organizations: Not on file    Relationship status: Not on file  Other Topics Concern  . Not on file  Social History Narrative   Lives alone.         Assessment and Plan:  See Problem List for Assessment and Plan of chronic medical problems.   Follow Up Instructions:    I discussed the assessment and treatment plan with the patient. The patient was provided an opportunity to ask questions and all were answered. The patient agreed with the plan and demonstrated  an understanding of the instructions.   The patient was advised to call back or seek an in-person evaluation if the symptoms worsen or if the condition fails to improve as anticipated.  Time spent on telephone call with patient: 7 minutes  [name], MD

## 2019-03-28 NOTE — Assessment & Plan Note (Signed)
Her mom just tested positive for COVID and she was in close contact with her and has similar symptoms Symptoms started just over a week ago Reviewed symptoms and what to monitor for If shortness of breath worsens she may need to be evaluated Agree there is no reason for her to get tested because most likely she does have COVID Continue Tylenol Okay to take Mucinex  She will call with any questions or concerns

## 2019-04-01 ENCOUNTER — Ambulatory Visit: Payer: Self-pay | Admitting: *Deleted

## 2019-04-01 ENCOUNTER — Other Ambulatory Visit: Payer: Self-pay

## 2019-04-01 MED ORDER — BENZONATATE 100 MG PO CAPS: 100.0000 mg | ORAL_CAPSULE | Freq: Three times a day (TID) | ORAL | 0 refills | Status: DC | PRN

## 2019-04-01 MED FILL — BENZONATATE 100 MG CAPS: 100 | 20 days supply | Qty: 60 | Fill #0

## 2019-04-01 NOTE — Addendum Note (Signed)
Addended by: Pricilla Holm A on: 04/01/2019 10:34 AM   Modules accepted: Orders

## 2019-04-01 NOTE — Telephone Encounter (Signed)
Sent in [place] for cough. She is not to leave house to pickup. She needs to have delivered or have someone else to pick up for her. Remind patient to continue to self-quarantine until after symptoms are clear then wait an additional  3 days then she can leave house. For any shortness of breath call back or if severe go to [place] or call 911.

## 2019-04-01 NOTE — Telephone Encounter (Signed)
[facility] [place] per patient request, reminded patient about self quarantine and dr crawfords further instructions

## 2019-04-01 NOTE — Telephone Encounter (Signed)
I called in the other day and talked with Dr. Quay Burow.   I can't deal with this cough.  I'm using Mucinex but it's not helping.  101.2 fever this morning. My mother tested positive for the COVID-19 and I was exposed to her. I I live alone and don't feel like driving to get tested for COVID-19.  I already know I probably have it. It's just this dry cough is not getting better.  I let pt know I would send a high priority note over to Dr. Quay Burow so when the office opens at 8:00 AM they can contact you.  She was agreeable to this plan.  I sent these notes to Dr. Quay Burow' office.     Reason for Disposition . [1] Fever > 101 F (38.3 C) AND [2] age > 27  Answer Assessment - Initial Assessment Questions 1. ONSET: "When did the cough begin?"      I talked with Dr. Quay Burow about it the other day but it's still not getting better with the Mucinex.   I also have a fever of 101 this morning.  It's been running around 99. 2. SEVERITY: "How bad is the cough today?"      It's not getting better.  It's a dry cough. 3. RESPIRATORY DISTRESS: "Describe your breathing."      No but I don't want to get into pneumonia. 4. FEVER: "Do you have a fever?" If so, ask: "What is your temperature, how was it measured, and when did it start?"     Yes 101 this morning 5. SPUTUM: "Describe the color of your sputum" (clear, white, yellow, green)     None 6. HEMOPTYSIS: "Are you coughing up any blood?" If so ask: "How much?" (flecks, streaks, tablespoons, etc.)     No 7. CARDIAC HISTORY: "Do you have any history of heart disease?" (e.g., heart attack, congestive heart failure)      Not asked since recently in contact with Dr. Quay Burow for same issue. 8. LUNG HISTORY: "Do you have any history of lung disease?"  (e.g., pulmonary embolus, asthma, emphysema)     See above 9. PE RISK FACTORS: "Do you have a history of blood clots?" (or: recent major surgery, recent prolonged travel, bedridden)     See above 10. OTHER SYMPTOMS: "Do  you have any other symptoms?" (e.g., runny nose, wheezing, chest pain)       No 11. PREGNANCY: "Is there any chance you are pregnant?" "When was your last menstrual period?"       N/A due to age 68. TRAVEL: "Have you traveled out of the country in the last month?" (e.g., travel history, exposures)       Not asked   But she does live alone and has not been in contact with anyone except her mother who is positive for COVID-19.  Protocols used: COUGH - ACUTE PRODUCTIVE-A-AH

## 2019-04-01 NOTE — Telephone Encounter (Signed)
Routing to dr crawford, please advise in the absence of dr burns, I will call patient back, thanks

## 2019-04-01 NOTE — Telephone Encounter (Signed)
Patient was seen by Dr Quay Burow on Monday (virtual visit).  Please advise on how to proceed with this.

## 2019-04-08 MED FILL — ONETOUCH VERIO TEST STRIP: 33 days supply | Qty: 200 | Fill #1

## 2019-04-08 MED FILL — VIT D2 1.25 MG (50,000 UNIT: 1.25 MG | 30 days supply | Qty: 10 | Fill #0

## 2019-04-12 MED FILL — MUPIROCIN 2% OINTMENT: 2 | 7 days supply | Qty: 22 | Fill #0

## 2019-04-14 ENCOUNTER — Telehealth: Payer: Self-pay | Admitting: Internal Medicine

## 2019-04-14 NOTE — Telephone Encounter (Signed)
Spoke to patient and clarified, she asked if she can get it at [place] and I said yes and no order was required

## 2019-04-14 NOTE — Telephone Encounter (Signed)
Patient is calling to result an order for an anitbody test. Please advise [phone]

## 2019-04-18 ENCOUNTER — Telehealth: Payer: Self-pay

## 2019-04-18 DIAGNOSIS — Z1159 Encounter for screening for other viral diseases: Secondary | ICD-10-CM

## 2019-04-18 NOTE — Telephone Encounter (Signed)
ordered

## 2019-04-18 NOTE — Telephone Encounter (Signed)
Pt would like orders for antibody testing faxed over to [facility] in [place]. She will be going there anyway this week to do orders for Dr. Posey Pronto and wanted to see if that could be sent in as well. Please advise.

## 2019-04-18 NOTE — Telephone Encounter (Signed)
Copied from [place] [id number]. Topic: General - Other >> Apr 18, 2019  9:01 AM Rainey Pines A wrote: Patient stated that [facility] is requesting an order for the antibody testing that patient wants to complete when she gets bloodwork done this week. Patient would like a callback at

## 2019-04-19 NOTE — Telephone Encounter (Signed)
Rx faxed over. Pt is aware.

## 2019-04-20 ENCOUNTER — Telehealth: Payer: Self-pay | Admitting: Internal Medicine

## 2019-04-20 NOTE — Telephone Encounter (Signed)
Spoke with Alice Kemp regarding AWV. Patient declined to schedule at this time. SF

## 2019-05-18 NOTE — Patient Instructions (Signed)
  Tests ordered today. Your results will be released to [facility] (or called to you) after review.  If any changes need to be made, you will be notified at that same time.  All other Health Maintenance issues reviewed.   All recommended immunizations and age-appropriate screenings are up-to-date or discussed.  No immunization administered today.   Medications reviewed and updated.  Changes include :     Your prescription(s) have been submitted to your pharmacy. Please take as directed and contact our office if you believe you are having problem(s) with the medication(s).  A referral was ordered for   Please followup in 6 months

## 2019-05-18 NOTE — Progress Notes (Signed)
    Subjective:    Patient ID: Alice Kemp, female    DOB: 1951/07/11, 68 y.o.   MRN: [id number]  HPI  This encounter was created in error - please disregard.

## 2019-05-19 ENCOUNTER — Encounter: Payer: PPO | Admitting: Internal Medicine

## 2019-05-19 ENCOUNTER — Other Ambulatory Visit: Payer: Self-pay

## 2019-05-19 ENCOUNTER — Telehealth: Payer: Self-pay | Admitting: Emergency Medicine

## 2019-05-19 MED ORDER — TRAMADOL HCL 50 MG PO TABS: 50.0000 mg | ORAL_TABLET | Freq: Two times a day (BID) | ORAL | 0 refills | Status: DC | PRN

## 2019-05-19 MED ORDER — CELECOXIB 100 MG PO CAPS: ORAL_CAPSULE | ORAL | 0 refills | Status: DC

## 2019-05-19 MED FILL — CELECOXIB 100 MG CAP: 100 | 30 days supply | Qty: 30 | Fill #0

## 2019-05-19 MED FILL — traMADol HCL 50 MG TABS: 50 | 7 days supply | Qty: 14 | Fill #0

## 2019-05-19 NOTE — Telephone Encounter (Signed)
sent 

## 2019-05-19 NOTE — Telephone Encounter (Signed)
Pt is rescheduled for 06/06/2019 at 11:00am due to still having Covid symptoms roughly 23 days ago. She is requesting a refill on her celecoxib (celebrex) 100 MG and Tramadol 50 MG to [facility]. Thanks.

## 2019-05-23 DIAGNOSIS — U071 COVID-19: Secondary | ICD-10-CM | POA: Diagnosis not present

## 2019-05-23 DIAGNOSIS — E1165 Type 2 diabetes mellitus with hyperglycemia: Secondary | ICD-10-CM | POA: Diagnosis not present

## 2019-05-23 DIAGNOSIS — R262 Difficulty in walking, not elsewhere classified: Secondary | ICD-10-CM | POA: Diagnosis not present

## 2019-05-23 DIAGNOSIS — M5136 Other intervertebral disc degeneration, lumbar region: Secondary | ICD-10-CM | POA: Diagnosis not present

## 2019-05-23 DIAGNOSIS — I959 Hypotension, unspecified: Secondary | ICD-10-CM | POA: Diagnosis not present

## 2019-05-23 DIAGNOSIS — N179 Acute kidney failure, unspecified: Secondary | ICD-10-CM | POA: Diagnosis not present

## 2019-05-23 DIAGNOSIS — M545 Low back pain: Secondary | ICD-10-CM | POA: Diagnosis not present

## 2019-05-23 DIAGNOSIS — R079 Chest pain, unspecified: Secondary | ICD-10-CM | POA: Diagnosis not present

## 2019-05-23 DIAGNOSIS — R112 Nausea with vomiting, unspecified: Secondary | ICD-10-CM | POA: Diagnosis not present

## 2019-05-23 DIAGNOSIS — I10 Essential (primary) hypertension: Secondary | ICD-10-CM | POA: Diagnosis not present

## 2019-05-23 DIAGNOSIS — R52 Pain, unspecified: Secondary | ICD-10-CM | POA: Diagnosis not present

## 2019-05-23 DIAGNOSIS — I4510 Unspecified right bundle-branch block: Secondary | ICD-10-CM | POA: Diagnosis not present

## 2019-05-23 DIAGNOSIS — L03311 Cellulitis of abdominal wall: Secondary | ICD-10-CM | POA: Diagnosis not present

## 2019-05-23 DIAGNOSIS — E871 Hypo-osmolality and hyponatremia: Secondary | ICD-10-CM | POA: Diagnosis not present

## 2019-05-24 ENCOUNTER — Inpatient Hospital Stay (HOSPITAL_COMMUNITY)
Admission: AD | Admit: 2019-05-24 | Discharge: 2019-06-06 | DRG: 981 | Disposition: A | Discharge status: Skilled Nursing Facility | Payer: PPO | Source: Other Acute Inpatient Hospital | Attending: Internal Medicine | Admitting: Internal Medicine

## 2019-05-24 ENCOUNTER — Other Ambulatory Visit: Payer: Self-pay

## 2019-05-24 ENCOUNTER — Encounter (HOSPITAL_COMMUNITY): Payer: Self-pay | Admitting: *Deleted

## 2019-05-24 DIAGNOSIS — Z794 Long term (current) use of insulin: Secondary | ICD-10-CM

## 2019-05-24 DIAGNOSIS — L03311 Cellulitis of abdominal wall: Secondary | ICD-10-CM | POA: Diagnosis not present

## 2019-05-24 DIAGNOSIS — F329 Major depressive disorder, single episode, unspecified: Secondary | ICD-10-CM | POA: Diagnosis not present

## 2019-05-24 DIAGNOSIS — R279 Unspecified lack of coordination: Secondary | ICD-10-CM | POA: Diagnosis not present

## 2019-05-24 DIAGNOSIS — N183 Chronic kidney disease, stage 3 (moderate): Secondary | ICD-10-CM | POA: Diagnosis present

## 2019-05-24 DIAGNOSIS — N179 Acute kidney failure, unspecified: Secondary | ICD-10-CM | POA: Diagnosis not present

## 2019-05-24 DIAGNOSIS — D631 Anemia in chronic kidney disease: Secondary | ICD-10-CM | POA: Diagnosis present

## 2019-05-24 DIAGNOSIS — E1122 Type 2 diabetes mellitus with diabetic chronic kidney disease: Secondary | ICD-10-CM | POA: Diagnosis not present

## 2019-05-24 DIAGNOSIS — I130 Hypertensive heart and chronic kidney disease with heart failure and stage 1 through stage 4 chronic kidney disease, or unspecified chronic kidney disease: Secondary | ICD-10-CM | POA: Diagnosis not present

## 2019-05-24 DIAGNOSIS — Z419 Encounter for procedure for purposes other than remedying health state, unspecified: Secondary | ICD-10-CM

## 2019-05-24 DIAGNOSIS — IMO0002 Reserved for concepts with insufficient information to code with codable children: Secondary | ICD-10-CM

## 2019-05-24 DIAGNOSIS — Z6841 Body Mass Index (BMI) 40.0 and over, adult: Secondary | ICD-10-CM | POA: Diagnosis not present

## 2019-05-24 DIAGNOSIS — Z743 Need for continuous supervision: Secondary | ICD-10-CM | POA: Diagnosis not present

## 2019-05-24 DIAGNOSIS — G8929 Other chronic pain: Secondary | ICD-10-CM | POA: Diagnosis present

## 2019-05-24 DIAGNOSIS — R2689 Other abnormalities of gait and mobility: Secondary | ICD-10-CM | POA: Diagnosis not present

## 2019-05-24 DIAGNOSIS — Z79899 Other long term (current) drug therapy: Secondary | ICD-10-CM

## 2019-05-24 DIAGNOSIS — I4510 Unspecified right bundle-branch block: Secondary | ICD-10-CM | POA: Diagnosis present

## 2019-05-24 DIAGNOSIS — G834 Cauda equina syndrome: Secondary | ICD-10-CM | POA: Diagnosis present

## 2019-05-24 DIAGNOSIS — M5126 Other intervertebral disc displacement, lumbar region: Secondary | ICD-10-CM | POA: Diagnosis present

## 2019-05-24 DIAGNOSIS — R41841 Cognitive communication deficit: Secondary | ICD-10-CM | POA: Diagnosis not present

## 2019-05-24 DIAGNOSIS — R0602 Shortness of breath: Secondary | ICD-10-CM | POA: Diagnosis not present

## 2019-05-24 DIAGNOSIS — M961 Postlaminectomy syndrome, not elsewhere classified: Secondary | ICD-10-CM | POA: Diagnosis not present

## 2019-05-24 DIAGNOSIS — M6281 Muscle weakness (generalized): Secondary | ICD-10-CM | POA: Diagnosis not present

## 2019-05-24 DIAGNOSIS — E559 Vitamin D deficiency, unspecified: Secondary | ICD-10-CM | POA: Diagnosis present

## 2019-05-24 DIAGNOSIS — I5032 Chronic diastolic (congestive) heart failure: Secondary | ICD-10-CM | POA: Diagnosis not present

## 2019-05-24 DIAGNOSIS — I10 Essential (primary) hypertension: Secondary | ICD-10-CM | POA: Diagnosis present

## 2019-05-24 DIAGNOSIS — G4733 Obstructive sleep apnea (adult) (pediatric): Secondary | ICD-10-CM | POA: Diagnosis not present

## 2019-05-24 DIAGNOSIS — D649 Anemia, unspecified: Secondary | ICD-10-CM | POA: Diagnosis present

## 2019-05-24 DIAGNOSIS — U071 COVID-19: Secondary | ICD-10-CM | POA: Diagnosis not present

## 2019-05-24 DIAGNOSIS — M5489 Other dorsalgia: Secondary | ICD-10-CM | POA: Diagnosis not present

## 2019-05-24 DIAGNOSIS — E785 Hyperlipidemia, unspecified: Secondary | ICD-10-CM | POA: Diagnosis present

## 2019-05-24 DIAGNOSIS — Z8349 Family history of other endocrine, nutritional and metabolic diseases: Secondary | ICD-10-CM

## 2019-05-24 DIAGNOSIS — M109 Gout, unspecified: Secondary | ICD-10-CM | POA: Diagnosis present

## 2019-05-24 DIAGNOSIS — E871 Hypo-osmolality and hyponatremia: Secondary | ICD-10-CM | POA: Diagnosis not present

## 2019-05-24 DIAGNOSIS — M5116 Intervertebral disc disorders with radiculopathy, lumbar region: Secondary | ICD-10-CM | POA: Diagnosis present

## 2019-05-24 DIAGNOSIS — R531 Weakness: Secondary | ICD-10-CM | POA: Diagnosis not present

## 2019-05-24 DIAGNOSIS — F419 Anxiety disorder, unspecified: Secondary | ICD-10-CM | POA: Diagnosis present

## 2019-05-24 DIAGNOSIS — M48061 Spinal stenosis, lumbar region without neurogenic claudication: Secondary | ICD-10-CM | POA: Diagnosis present

## 2019-05-24 DIAGNOSIS — E039 Hypothyroidism, unspecified: Secondary | ICD-10-CM | POA: Diagnosis present

## 2019-05-24 DIAGNOSIS — T502X5A Adverse effect of carbonic-anhydrase inhibitors, benzothiadiazides and other diuretics, initial encounter: Secondary | ICD-10-CM | POA: Diagnosis not present

## 2019-05-24 DIAGNOSIS — Z981 Arthrodesis status: Secondary | ICD-10-CM | POA: Diagnosis not present

## 2019-05-24 DIAGNOSIS — Z79890 Hormone replacement therapy (postmenopausal): Secondary | ICD-10-CM

## 2019-05-24 DIAGNOSIS — R2681 Unsteadiness on feet: Secondary | ICD-10-CM | POA: Diagnosis not present

## 2019-05-24 DIAGNOSIS — E1165 Type 2 diabetes mellitus with hyperglycemia: Secondary | ICD-10-CM | POA: Diagnosis not present

## 2019-05-24 DIAGNOSIS — M21372 Foot drop, left foot: Secondary | ICD-10-CM | POA: Diagnosis present

## 2019-05-24 DIAGNOSIS — R262 Difficulty in walking, not elsewhere classified: Secondary | ICD-10-CM | POA: Diagnosis not present

## 2019-05-24 DIAGNOSIS — Z9181 History of falling: Secondary | ICD-10-CM | POA: Diagnosis not present

## 2019-05-24 DIAGNOSIS — M5136 Other intervertebral disc degeneration, lumbar region: Secondary | ICD-10-CM | POA: Diagnosis not present

## 2019-05-24 DIAGNOSIS — Z8249 Family history of ischemic heart disease and other diseases of the circulatory system: Secondary | ICD-10-CM

## 2019-05-24 DIAGNOSIS — Z833 Family history of diabetes mellitus: Secondary | ICD-10-CM

## 2019-05-24 DIAGNOSIS — E538 Deficiency of other specified B group vitamins: Secondary | ICD-10-CM | POA: Diagnosis present

## 2019-05-24 DIAGNOSIS — I272 Pulmonary hypertension, unspecified: Secondary | ICD-10-CM | POA: Diagnosis present

## 2019-05-24 DIAGNOSIS — R52 Pain, unspecified: Secondary | ICD-10-CM | POA: Diagnosis not present

## 2019-05-24 LAB — CBC WITH DIFFERENTIAL/PLATELET
Abs Immature Granulocytes: 0.04 10*3/uL (ref 0.00–0.07)
Basophils Absolute: 0 10*3/uL (ref 0.0–0.1)
Basophils Relative: 0 %
Eosinophils Absolute: 0.2 10*3/uL (ref 0.0–0.5)
Eosinophils Relative: 2 %
HCT: 29.3 % — ABNORMAL LOW (ref 36.0–46.0)
Hemoglobin: 9.3 g/dL — ABNORMAL LOW (ref 12.0–15.0)
Immature Granulocytes: 0 %
Lymphocytes Relative: 34 %
Lymphs Abs: 3.3 10*3/uL (ref 0.7–4.0)
MCH: 29.9 pg (ref 26.0–34.0)
MCHC: 31.7 g/dL (ref 30.0–36.0)
MCV: 94.2 fL (ref 80.0–100.0)
Monocytes Absolute: 0.9 10*3/uL (ref 0.1–1.0)
Monocytes Relative: 9 %
Neutro Abs: 5.4 10*3/uL (ref 1.7–7.7)
Neutrophils Relative %: 55 %
Platelets: 225 10*3/uL (ref 150–400)
RBC: 3.11 MIL/uL — ABNORMAL LOW (ref 3.87–5.11)
RDW: 15 % (ref 11.5–15.5)
WBC: 10 10*3/uL (ref 4.0–10.5)
nRBC: 0 % (ref 0.0–0.2)

## 2019-05-24 LAB — HEMOGLOBIN A1C
Hgb A1c MFr Bld: 8.9 % — ABNORMAL HIGH (ref 4.8–5.6)
Mean Plasma Glucose: 208.73 mg/dL

## 2019-05-24 LAB — COMPREHENSIVE METABOLIC PANEL
ALT: 12 U/L (ref 0–44)
AST: 16 U/L (ref 15–41)
Albumin: 2.8 g/dL — ABNORMAL LOW (ref 3.5–5.0)
Alkaline Phosphatase: 44 U/L (ref 38–126)
Anion gap: 7 (ref 5–15)
BUN: 40 mg/dL — ABNORMAL HIGH (ref 8–23)
CO2: 24 mmol/L (ref 22–32)
Calcium: 8.4 mg/dL — ABNORMAL LOW (ref 8.9–10.3)
Chloride: 103 mmol/L (ref 98–111)
Creatinine, Ser: 2.47 mg/dL — ABNORMAL HIGH (ref 0.44–1.00)
GFR calc Af Amer: 22 mL/min — ABNORMAL LOW (ref >60)
GFR calc non Af Amer: 19 mL/min — ABNORMAL LOW (ref >60)
Glucose, Bld: 179 mg/dL — ABNORMAL HIGH (ref 70–99)
Potassium: 4.5 mmol/L (ref 3.5–5.1)
Sodium: 134 mmol/L — ABNORMAL LOW (ref 135–145)
Total Bilirubin: 0.7 mg/dL (ref 0.3–1.2)
Total Protein: 6.7 g/dL (ref 6.5–8.1)

## 2019-05-24 LAB — IRON AND TIBC
Iron: 78 ug/dL (ref 28–170)
Saturation Ratios: 27 % (ref 10.4–31.8)
TIBC: 292 ug/dL (ref 250–450)
UIBC: 214 ug/dL

## 2019-05-24 LAB — URINALYSIS, ROUTINE W REFLEX MICROSCOPIC
Bilirubin Urine: NEGATIVE
Glucose, UA: NEGATIVE mg/dL
Hgb urine dipstick: NEGATIVE
Ketones, ur: NEGATIVE mg/dL
Leukocytes,Ua: NEGATIVE
Nitrite: NEGATIVE
Protein, ur: NEGATIVE mg/dL
Specific Gravity, Urine: 1.004 — ABNORMAL LOW (ref 1.005–1.030)
pH: 7 (ref 5.0–8.0)

## 2019-05-24 LAB — FOLATE: Folate: 9.7 ng/mL (ref >5.9)

## 2019-05-24 LAB — RETICULOCYTES
Immature Retic Fract: 21.4 % — ABNORMAL HIGH (ref 2.3–15.9)
RBC.: 3.11 MIL/uL — ABNORMAL LOW (ref 3.87–5.11)
Retic Count, Absolute: 58.8 10*3/uL (ref 19.0–186.0)
Retic Ct Pct: 1.9 % (ref 0.4–3.1)

## 2019-05-24 LAB — GLUCOSE, CAPILLARY
Glucose-Capillary: 147 mg/dL — ABNORMAL HIGH (ref 70–99)
Glucose-Capillary: 168 mg/dL — ABNORMAL HIGH (ref 70–99)
Glucose-Capillary: 216 mg/dL — ABNORMAL HIGH (ref 70–99)
Glucose-Capillary: 121 mg/dL — ABNORMAL HIGH (ref 70–99)

## 2019-05-24 LAB — SARS CORONAVIRUS 2 BY RT PCR (HOSPITAL ORDER, PERFORMED IN ~~LOC~~ HOSPITAL LAB): SARS Coronavirus 2: POSITIVE — AB

## 2019-05-24 LAB — FERRITIN: Ferritin: 81 ng/mL (ref 11–307)

## 2019-05-24 LAB — VITAMIN B12: Vitamin B-12: 251 pg/mL (ref 180–914)

## 2019-05-24 LAB — TSH: TSH: 0.819 u[IU]/mL (ref 0.350–4.500)

## 2019-05-24 MED ORDER — INSULIN ASPART PROT & ASPART (70-30 MIX) 100 UNIT/ML ~~LOC~~ SUSP: 40.0000 [IU] | Freq: Two times a day (BID) | SUBCUTANEOUS | Status: DC

## 2019-05-24 MED ORDER — MAGNESIUM OXIDE 400 (241.3 MG) MG PO TABS
200.0000 mg | ORAL_TABLET | Freq: Every day | ORAL | Status: DC
  Administered 2019-05-25 – 2019-06-06 (×13): 200 mg via ORAL
  Filled 2019-05-24 (×13): qty 1

## 2019-05-24 MED ORDER — VITAMIN B-12 500 MCG PO TABS: 500.0000 ug | ORAL_TABLET | Freq: Two times a day (BID) | ORAL | Status: DC

## 2019-05-24 MED ORDER — SODIUM CHLORIDE 0.9 % IV SOLN
1.0000 g | Freq: Once | INTRAVENOUS | Status: AC
  Administered 2019-05-24: 1 g via INTRAVENOUS
  Filled 2019-05-24: qty 1

## 2019-05-24 MED ORDER — SODIUM CHLORIDE 0.9 % IV SOLN
1.0000 g | Freq: Two times a day (BID) | INTRAVENOUS | Status: DC
  Filled 2019-05-24: qty 1

## 2019-05-24 MED ORDER — GABAPENTIN 300 MG PO CAPS
300.0000 mg | ORAL_CAPSULE | Freq: Three times a day (TID) | ORAL | Status: DC
  Administered 2019-05-24 – 2019-06-06 (×35): 300 mg via ORAL
  Filled 2019-05-24 (×37): qty 1

## 2019-05-24 MED ORDER — ALLOPURINOL 100 MG PO TABS: 100.0000 mg | ORAL_TABLET | Freq: Every day | ORAL | Status: DC

## 2019-05-24 MED ORDER — INSULIN ASPART PROT & ASPART (70-30 MIX) 100 UNIT/ML ~~LOC~~ SUSP
8.0000 [IU] | Freq: Two times a day (BID) | SUBCUTANEOUS | Status: DC
  Administered 2019-05-24 – 2019-06-06 (×23): 8 [IU] via SUBCUTANEOUS
  Filled 2019-05-24: qty 10

## 2019-05-24 MED ORDER — SODIUM CHLORIDE 0.9 % IV SOLN
2.0000 g | INTRAVENOUS | Status: DC
  Administered 2019-05-24 – 2019-05-25 (×2): 2 g via INTRAVENOUS
  Filled 2019-05-24 (×2): qty 2
  Filled 2019-05-24: qty 20

## 2019-05-24 MED ORDER — SODIUM CHLORIDE 0.9 % IV SOLN
INTRAVENOUS | Status: DC
  Administered 2019-05-24 – 2019-05-27 (×5): via INTRAVENOUS

## 2019-05-24 MED ORDER — VANCOMYCIN HCL 10 G IV SOLR
2000.0000 mg | Freq: Once | INTRAVENOUS | Status: AC
  Administered 2019-05-24: 2000 mg via INTRAVENOUS
  Filled 2019-05-24: qty 2000

## 2019-05-24 MED ORDER — LEVOCETIRIZINE DIHYDROCHLORIDE 5 MG PO TABS: 5.0000 mg | ORAL_TABLET | Freq: Every evening | ORAL | Status: DC

## 2019-05-24 MED ORDER — LOSARTAN POTASSIUM 25 MG PO TABS: 25.0000 mg | ORAL_TABLET | Freq: Every day | ORAL | Status: DC

## 2019-05-24 MED ORDER — DULOXETINE HCL 30 MG PO CPEP
30.0000 mg | ORAL_CAPSULE | Freq: Two times a day (BID) | ORAL | Status: DC
  Administered 2019-05-24 – 2019-06-06 (×26): 30 mg via ORAL
  Filled 2019-05-24 (×27): qty 1

## 2019-05-24 MED ORDER — DULOXETINE HCL 30 MG PO CPEP: 30.0000 mg | ORAL_CAPSULE | Freq: Two times a day (BID) | ORAL | Status: DC

## 2019-05-24 MED ORDER — BENZONATATE 100 MG PO CAPS: 100.0000 mg | ORAL_CAPSULE | Freq: Three times a day (TID) | ORAL | Status: DC | PRN

## 2019-05-24 MED ORDER — BUPROPION HCL ER (SR) 100 MG PO TB12
200.0000 mg | ORAL_TABLET | Freq: Every day | ORAL | Status: DC
  Administered 2019-05-25 – 2019-06-06 (×13): 200 mg via ORAL
  Filled 2019-05-24 (×13): qty 2

## 2019-05-24 MED ORDER — VITAMIN B-12 1000 MCG PO TABS
500.0000 ug | ORAL_TABLET | Freq: Two times a day (BID) | ORAL | Status: DC
  Administered 2019-05-24 – 2019-06-06 (×26): 500 ug via ORAL
  Filled 2019-05-24 (×26): qty 1

## 2019-05-24 MED ORDER — ACETAMINOPHEN 325 MG PO TABS: 650.0000 mg | ORAL_TABLET | Freq: Four times a day (QID) | ORAL | Status: DC | PRN

## 2019-05-24 MED ORDER — FUROSEMIDE 40 MG PO TABS: 80.0000 mg | ORAL_TABLET | Freq: Every day | ORAL | Status: DC

## 2019-05-24 MED ORDER — SODIUM CHLORIDE 0.9 % IV SOLN
INTRAVENOUS | Status: DC
  Administered 2019-05-24: 07:00:00 via INTRAVENOUS

## 2019-05-24 MED ORDER — HYDROCODONE-ACETAMINOPHEN 5-325 MG PO TABS
1.0000 | ORAL_TABLET | ORAL | Status: DC | PRN
  Administered 2019-05-24: 1 via ORAL
  Administered 2019-05-24 – 2019-05-29 (×17): 2 via ORAL
  Filled 2019-05-24 (×2): qty 2
  Filled 2019-05-24: qty 1
  Filled 2019-05-24 (×15): qty 2

## 2019-05-24 MED ORDER — ASPIRIN 81 MG PO TABS: 81.0000 mg | ORAL_TABLET | Freq: Every day | ORAL | Status: DC

## 2019-05-24 MED ORDER — ONDANSETRON HCL 4 MG/2ML IJ SOLN: 4.0000 mg | Freq: Four times a day (QID) | INTRAMUSCULAR | Status: DC | PRN

## 2019-05-24 MED ORDER — LEVOTHYROXINE SODIUM 88 MCG PO TABS
88.0000 ug | ORAL_TABLET | Freq: Every day | ORAL | Status: DC
  Administered 2019-05-25 – 2019-06-06 (×13): 88 ug via ORAL
  Filled 2019-05-24 (×13): qty 1

## 2019-05-24 MED ORDER — ASPIRIN EC 81 MG PO TBEC
81.0000 mg | DELAYED_RELEASE_TABLET | Freq: Every day | ORAL | Status: DC
  Administered 2019-05-25 – 2019-06-06 (×12): 81 mg via ORAL
  Filled 2019-05-24 (×13): qty 1

## 2019-05-24 MED ORDER — AMITRIPTYLINE HCL 10 MG PO TABS
10.0000 mg | ORAL_TABLET | Freq: Every evening | ORAL | Status: DC | PRN
  Filled 2019-05-24 (×2): qty 1

## 2019-05-24 MED ORDER — LORATADINE 10 MG PO TABS
10.0000 mg | ORAL_TABLET | Freq: Every day | ORAL | Status: DC
  Administered 2019-05-24 – 2019-06-05 (×13): 10 mg via ORAL
  Filled 2019-05-24 (×13): qty 1

## 2019-05-24 MED ORDER — ONDANSETRON HCL 4 MG PO TABS: 4.0000 mg | ORAL_TABLET | Freq: Four times a day (QID) | ORAL | Status: DC | PRN

## 2019-05-24 MED ORDER — SODIUM CHLORIDE 0.9 % IV SOLN: INTRAVENOUS | Status: DC

## 2019-05-24 MED ORDER — MAGNESIUM 100 MG PO CAPS: 2.0000 | ORAL_CAPSULE | Freq: Every day | ORAL | Status: DC

## 2019-05-24 MED ORDER — ALLOPURINOL 100 MG PO TABS
100.0000 mg | ORAL_TABLET | Freq: Every day | ORAL | Status: DC
  Administered 2019-05-25 – 2019-06-06 (×13): 100 mg via ORAL
  Filled 2019-05-24 (×13): qty 1

## 2019-05-24 MED ORDER — MECLIZINE HCL 25 MG PO TABS
12.5000 mg | ORAL_TABLET | Freq: Three times a day (TID) | ORAL | Status: DC | PRN
  Filled 2019-05-24: qty 1

## 2019-05-24 MED ORDER — TRAMADOL HCL 50 MG PO TABS
50.0000 mg | ORAL_TABLET | Freq: Two times a day (BID) | ORAL | Status: DC | PRN
  Administered 2019-05-24: 50 mg via ORAL
  Filled 2019-05-24: qty 1

## 2019-05-24 MED ORDER — SPIRONOLACTONE 25 MG PO TABS: 25.0000 mg | ORAL_TABLET | Freq: Every day | ORAL | Status: DC

## 2019-05-24 MED ORDER — METHOCARBAMOL 1000 MG/10ML IJ SOLN
500.0000 mg | Freq: Four times a day (QID) | INTRAVENOUS | Status: DC | PRN
  Administered 2019-05-24 – 2019-05-29 (×7): 500 mg via INTRAVENOUS
  Filled 2019-05-24 (×4): qty 500
  Filled 2019-05-24 (×2): qty 5
  Filled 2019-05-24: qty 500

## 2019-05-24 MED ORDER — ACETAMINOPHEN 650 MG RE SUPP: 650.0000 mg | Freq: Four times a day (QID) | RECTAL | Status: DC | PRN

## 2019-05-24 MED ORDER — ENOXAPARIN SODIUM 30 MG/0.3ML ~~LOC~~ SOLN
30.0000 mg | Freq: Every day | SUBCUTANEOUS | Status: DC
  Administered 2019-05-24 – 2019-05-26 (×3): 30 mg via SUBCUTANEOUS
  Filled 2019-05-24 (×3): qty 0.3

## 2019-05-24 MED ORDER — ROSUVASTATIN CALCIUM 5 MG PO TABS
5.0000 mg | ORAL_TABLET | Freq: Every day | ORAL | Status: DC
  Administered 2019-05-24 – 2019-06-06 (×14): 5 mg via ORAL
  Filled 2019-05-24 (×14): qty 1

## 2019-05-24 MED ORDER — VANCOMYCIN VARIABLE DOSE PER UNSTABLE RENAL FUNCTION (PHARMACIST DOSING): Status: DC

## 2019-05-24 MED ORDER — INSULIN ASPART 100 UNIT/ML ~~LOC~~ SOLN
0.0000 [IU] | Freq: Three times a day (TID) | SUBCUTANEOUS | Status: DC
  Administered 2019-05-24: 2 [IU] via SUBCUTANEOUS
  Administered 2019-05-24: 5 [IU] via SUBCUTANEOUS
  Administered 2019-05-24: 3 [IU] via SUBCUTANEOUS
  Administered 2019-05-25 (×2): 2 [IU] via SUBCUTANEOUS
  Administered 2019-05-25: 5 [IU] via SUBCUTANEOUS
  Administered 2019-05-26 (×2): 3 [IU] via SUBCUTANEOUS
  Administered 2019-05-26: 2 [IU] via SUBCUTANEOUS
  Administered 2019-05-27: 18:00:00 3 [IU] via SUBCUTANEOUS
  Administered 2019-05-27 (×2): 2 [IU] via SUBCUTANEOUS
  Administered 2019-05-28: 3 [IU] via SUBCUTANEOUS
  Administered 2019-05-28: 2 [IU] via SUBCUTANEOUS
  Administered 2019-05-28 – 2019-05-29 (×2): 3 [IU] via SUBCUTANEOUS
  Administered 2019-05-29: 2 [IU] via SUBCUTANEOUS
  Administered 2019-05-29: 3 [IU] via SUBCUTANEOUS
  Administered 2019-05-30 – 2019-06-01 (×4): 2 [IU] via SUBCUTANEOUS
  Administered 2019-06-01: 5 [IU] via SUBCUTANEOUS
  Administered 2019-06-01 – 2019-06-03 (×5): 3 [IU] via SUBCUTANEOUS
  Administered 2019-06-03 (×2): 2 [IU] via SUBCUTANEOUS
  Administered 2019-06-04 (×2): 3 [IU] via SUBCUTANEOUS
  Administered 2019-06-04 – 2019-06-05 (×3): 2 [IU] via SUBCUTANEOUS
  Administered 2019-06-06: 3 [IU] via SUBCUTANEOUS

## 2019-05-24 MED ORDER — DIAZEPAM 5 MG/ML IJ SOLN
2.5000 mg | Freq: Once | INTRAMUSCULAR | Status: AC
  Administered 2019-05-24: 2.5 mg via INTRAVENOUS
  Filled 2019-05-24: qty 2

## 2019-05-24 MED ORDER — BUPROPION HCL ER (SR) 100 MG PO TB12: 200.0000 mg | ORAL_TABLET | Freq: Every day | ORAL | Status: DC

## 2019-05-24 MED ORDER — VITAMIN D (ERGOCALCIFEROL) 1.25 MG (50000 UNIT) PO CAPS: 50000.0000 [IU] | ORAL_CAPSULE | ORAL | Status: DC

## 2019-05-24 NOTE — H&P (Signed)
History and Physical    Alice Kemp QHU:[id number] DOB: 1951-03-01 DOA: 05/24/2019  PCP: [name], MD   Patient coming from: Home.  I have personally briefly reviewed patient's old medical records in [place]  Chief Complaint: Weakness.  HPI: Alice Kemp is a 68 y.o. female with medical history significant of anxiety, depression, osteoarthritis, chronic back pain, benign colon neoplasm, history of left breast abscess, cervical radiculopathy, chronic kidney disease, constipation, type 2 diabetes, chronic diastolic heart failure, glaucoma, history of gout, hyperlipidemia, hypothyroidism, morbid obesity, obstructive sleep apnea, pericardial effusion, pulmonary hypertension, vitamin B12 and vitamin D deficiency who is transferred from [facility] emergency department to this facility after presenting there with progressively worse lower back pain with worsening lower extremity weakness, generalized weakness for several days, multiple episodes of emesis yesterday, decreased oral intake, worsening right-sided lower abdomen wounds.initially began after the patient scratched the area following having local pruritus.  She denies fever, but complains of chills, nausea and emesis.  She has been lightheaded.  No sore throat, rhinorrhea, wheezing, hemoptysis, dyspnea, chest pain, palpitations, diaphoresis, orthopnea or recent pitting edema of the lower extremities.  Denies diarrhea, melena or hematochezia.  No dysuria, frequency or hematuria.  Denies polyuria, polydipsia, polyphagia or blurred vision.  ED Course: Initial vital signs were temperature 99.7 F, pulse 85, respiratory rate 20, blood pressure 151/71 mmHg and O2 sat 99%.  White count was 11.3 with 62% neutrophils, 20% lymphocytes and 9% monocytes., hemoglobin 9.4 g/dL and platelets 247.  Her sodium is 125, potassium 4.6, chloride 92 carbon dioxide 22 mmol/L.  Her creatinine was 2.9, BUN 47 and glucose 238 mg/dL.  PT was  10.9 seconds and INR 1.1.  APTT 23.6 seconds.  Mixed venous blood gas was unremarkable except for a low PCO2 of 36.0 mmHg.  EKG was NSR with known RBBB and nonspecific ST changes.  Review of Systems: As per HPI otherwise 10 point review of systems negative.   Past Medical History:  Diagnosis Date  . Anxiety   . Arthritis   . Back pain   . Benign neoplasm of colon 06/29/2012  . Breast abscess 07/2011   left; s/p I&D + abx by gen surg  . Cervical radiculopathy at C6   . CKD (chronic kidney disease) stage 3, GFR 30-59 ml/min (HCC)    follows with renal  . Constipation   . Depression   . Diabetes mellitus type II   . Diastolic heart failure ([place]) 09/2013 echo  . Gall bladder inflammation   . Glaucoma   . Gout   . Hyperlipidemia   . Hypothyroidism   . Joint pain   . Kidney disease   . Leg pain   . Morbid obesity ([place])   . OBSTRUCTIVE SLEEP APNEA 12/23/2010   npsg 2012:  AHI 25/hr, severe desat to 68%.   autoset 2013: optimal pressure 13cm   . PERICARDIAL EFFUSION 12/12/2010  . Pulmonary hypertension ([place])   . Shortness of breath   . Skin lesion of breast 07/09/2012  . Swelling of extremity   . Unspecified hypothyroidism   . Vitamin B12 deficiency   . Vitamin D deficiency     Past Surgical History:  Procedure Laterality Date  . BREAST BIOPSY  1990   Left  . CATARACT EXTRACTION    . COLONOSCOPY  06/29/2012   Procedure: COLONOSCOPY;  Surgeon: Ladene Artist, MD,FACG;  Location: [facility] ENDOSCOPY;  Service: Endoscopy;  Laterality: N/A;  . SHOULDER SURGERY  2010  Left  . TONSILLECTOMY  1964     reports that she has never smoked. She has never used smokeless tobacco. She reports that she does not drink alcohol or use drugs.  Allergies  Allergen Reactions  . Sulfa Antibiotics   . Penicillins Rash    Family History  Problem Relation Age of Onset  . Hypertension Mother   . Heart disease Mother   . Heart attack Mother   . Thyroid disease Mother   . Diabetes Father   . Heart  disease Father   . Skin cancer Father   . Cancer Father        skin  . Heart attack Father   . Hyperlipidemia Father   . Colon polyps Father   . Depression Father   . Hypertension Brother   . Skin cancer Paternal Grandfather   . Colon cancer Paternal Grandfather 45  . Breast cancer Paternal Aunt   . Cancer Paternal Aunt        breast  . Diabetes Paternal Uncle   . Diabetes Maternal Grandfather   . Sudden death Neg Hx    Prior to Admission medications   Medication Sig Start Date End Date Taking? Authorizing Provider  allopurinol (ZYLOPRIM) 100 MG tablet TAKE 1 TABLET BY MOUTH ONCE DAILY 06/28/18   [name], MD  Alpha-Lipoic Acid 100 MG CAPS Take by mouth.    [provider]  amitriptyline (ELAVIL) 10 MG tablet at bedtime as needed.  03/26/15   [provider]  aspirin 81 MG tablet Take 81 mg by mouth daily. Reported on 12/24/2015    [provider]  benzonatate (TESSALON) 100 MG capsule Take 1 capsule (100 mg total) by mouth 3 (three) times daily as needed for cough. 04/01/19   Hoyt Koch, MD  buPROPion (WELLBUTRIN SR) 200 MG 12 hr tablet Take 1 tablet (200 mg total) by mouth daily. 12/15/18   Abby Potash, PA-C  celecoxib (CELEBREX) 100 MG capsule Take 1 capsule (100 mg total) by mouth daily as needed. 05/19/19   [name], MD  Coenzyme Q10 (EQL COQ10) 300 MG CAPS Take 1 capsule (300 mg total) daily at 12 noon by mouth. 08/11/17   Dennard Nip D, MD  colchicine 0.6 MG tablet TAKE 2 TABLETS BY MOUTH AT ONSET OF FLARE THEN TAKE 1 TABLET 1 HOUR LATER 06/28/18   Burns, Claudina Lick, MD  DULoxetine (CYMBALTA) 30 MG capsule Take 1 capsule (30 mg total) by mouth 2 (two) times daily. 05/11/18   [name], MD  fluticasone (CUTIVATE) 0.05 % cream APPLY 3 TIMES A DAY AS NEEDED FOR ITCHING 07/23/12   Renato Shin, MD  furosemide (LASIX) 80 MG tablet Take 1 tablet (80 mg total) by mouth daily. 05/28/15   Rowe Clack, MD  gabapentin (NEURONTIN)  300 MG capsule 300 mg 3 (three) times daily.  05/05/16   [provider]  levocetirizine (XYZAL) 5 MG tablet TAKE 1 TABLET BY MOUTH EVERY EVENING 05/13/17   Burns, Claudina Lick, MD  levothyroxine (SYNTHROID, LEVOTHROID) 88 MCG tablet Take 88 mcg by mouth daily before breakfast.    [provider]  losartan (COZAAR) 25 MG tablet Take 1 tablet (25 mg total) by mouth daily. -- Office visit needed for further refills 11/12/17   [name], MD  Magnesium 100 MG CAPS Take 2 capsules by mouth daily.    [provider]  meclizine (ANTIVERT) 25 MG tablet Take 0.5-1 tablets (12.5-25 mg total) by  mouth 3 (three) times daily as needed for dizziness. 01/05/19   [name], MD  mupirocin ointment (BACTROBAN) 2 % Apply 1 application topically 2 (two) times daily. 01/07/19   Burns, Claudina Lick, MD  NOVOLOG MIX 70/30 (70-30) 100 UNIT/ML injection Inject into the skin. 85 units breakfast , 55 units lunch and dinner    [provider]  ONETOUCH VERIO test strip Check BS twice daily 12/15/18   Abby Potash, PA-C  rosuvastatin (CRESTOR) 5 MG tablet Take 1 tablet (5 mg total) by mouth daily. 09/24/17   Dennard Nip D, MD  spironolactone (ALDACTONE) 25 MG tablet TAKE 1 TABLET (25 MG TOTAL) BY MOUTH DAILY. 06/28/18   [name], MD  traMADol (ULTRAM) 50 MG tablet Take 1 tablet (50 mg total) by mouth every 12 (twelve) hours as needed. 05/19/19   [name], MD  vitamin B-12 (CYANOCOBALAMIN) 500 MCG tablet Take 500 mcg by mouth 2 (two) times daily.    [provider]  Vitamin D, Ergocalciferol, (DRISDOL) 1.25 MG (50000 UT) CAPS capsule Take 1 capsule (50,000 Units total) by mouth every 3 (three) days. 03/15/19   Abby Potash, PA-C    Physical Exam: Vitals:   05/24/19 0348  BP: 139/86  Pulse: 72  Resp: 18  Temp: 98.3 F (36.8 C)  TempSrc: Oral  SpO2: 99%  Weight: 116.2 kg  Height: 5\' 4"  (1.626 m)    Constitutional: Looks acutely ill, but currently in NAD. Eyes:  PERRL, lids and conjunctivae normal ENMT: Mucous membranes are dry. Posterior pharynx clear of any exudate or lesions. Neck: normal, supple, no masses, no thyromegaly Respiratory: Decreased breath sounds in bases, otherwise clear to auscultation bilaterally, no wheezing, no crackles. Normal respiratory effort. No accessory muscle use.  Cardiovascular: Regular rate and rhythm, no murmurs / rubs / gallops.  Positive stage I lymphedema.  No extremity pitting edema. 2+ pedal pulses. No carotid bruits.  Abdomen: Obese, positive RLQ tenderness, no masses palpated. No hepatosplenomegaly. Bowel sounds positive.  Musculoskeletal: no clubbing / cyanosis. Good ROM, no contractures. Normal muscle tone.  Skin: Positive right lower quadrant and lower periumbilical excoriated wounds with surrounding edema, erythema, calor and TTP.  There was no fluctuance or induration.  Positive serous and foul-smelling discharge. Neurologic: CN 2-12 grossly intact. Sensation intact, DTR normal.  Generalized weakness. Psychiatric: Normal judgment and insight. Alert and oriented x 3. Normal mood.   Labs on Admission: I have personally reviewed following labs and imaging studies  CBC: No results for input(s): WBC, NEUTROABS, HGB, HCT, MCV, PLT in the last 168 hours. Basic Metabolic Panel: No results for input(s): NA, K, CL, CO2, GLUCOSE, BUN, CREATININE, CALCIUM, MG, PHOS in the last 168 hours. GFR: CrCl cannot be calculated (Patient's most recent lab result is older than the maximum 21 days allowed.). Liver Function Tests: No results for input(s): AST, ALT, ALKPHOS, BILITOT, PROT, ALBUMIN in the last 168 hours. No results for input(s): LIPASE, AMYLASE in the last 168 hours. No results for input(s): AMMONIA in the last 168 hours. Coagulation Profile: No results for input(s): INR, PROTIME in the last 168 hours. Cardiac Enzymes: No results for input(s): CKTOTAL, CKMB, CKMBINDEX, TROPONINI in the last 168 hours. BNP (last 3  results) No results for input(s): PROBNP in the last 8760 hours. HbA1C: No results for input(s): HGBA1C in the last 72 hours. CBG: No results for input(s): GLUCAP in the last 168 hours. Lipid Profile: No results for input(s): CHOL, HDL, LDLCALC, TRIG, CHOLHDL, LDLDIRECT in  the last 72 hours. Thyroid Function Tests: No results for input(s): TSH, T4TOTAL, FREET4, T3FREE, THYROIDAB in the last 72 hours. Anemia Panel: No results for input(s): VITAMINB12, FOLATE, FERRITIN, TIBC, IRON, RETICCTPCT in the last 72 hours. Urine analysis:  Radiological Exams on Admission: No results found.  EKG: Independently reviewed.  Assessment/Plan Principal Problem:   Cellulitis of abdominal wall Admit to MedSurg/inpatient/COVID positive isolation. Check COVID 19 test at our facility. Analgesics as needed. Antiemetics as needed. Meropenem per pharmacy. Vancomycin per pharmacy.  Active Problems:   AKI (acute kidney injury) (HCC) Normal saline 100 mL/h. Check urinalysis. Monitor intake and output. Follow-up renal function electrolytes.    Hyponatremia Secondary to diuretic use and GI losses. Continue time-limited and gentle NS infusion. Follow-up sodium level.    Anemia Check anemia panel. Monitor H&H.    Uncontrolled type 2 diabetes mellitus with stage 3 chronic kidney disease (HCC) Carbohydrate modified diet. CBG monitoring with RI SS. Will need confirmation on NovoLog dosage.    Hypothyroidism Check TSH. Continue levothyroxine once dosage confirmed.    Chronic diastolic heart failure (HCC) No signs of decompensation. Holding diuretics and antihypertensives. Monitor fluid status closely.    Essential hypertension Hold diuretics and ARB. Monitor blood pressure, renal function electrolytes.    Morbid obesity ([place]) Needs significant lifestyle modifications.    DVT prophylaxis: Lovenox SQ. Code Status: Full code. Family Communication: Disposition Plan: Admit for IV  hydration and IV antibiotic for several days. Consults called: Admission status: Inpatient/MedSurg.   Reubin Milan MD [facility]  If 7PM-7AM, please contact night-coverage [email or web address]  05/24/2019, 5:52 AM   This document was prepared using Dragon voice recognition software and may contain some unintended transcription errors.

## 2019-05-24 NOTE — Plan of Care (Signed)
Patient Has been Rehydrated

## 2019-05-24 NOTE — Progress Notes (Signed)
Pharmacy Antibiotic Note  Alice Kemp is a 68 y.o. female admitted on 05/24/2019 with cellulitis.  Pharmacy has been consulted for meropenem and vancomycin dosing.  Spoke with RPh at [facility], scr was 2.9 there, lab is pending here at [facility].   Plan: Meropenem 1 Gm IV q12h Vancomycin 2 Gm x1- f/u scr here at [facility] Goal AUC = 400-550 F/u scr/cultures/levels  Height: 5\' 4"  (162.6 cm) Weight: 256 lb 1.6 oz (116.2 kg) IBW/kg (Calculated) : 54.7  Temp (24hrs), Avg:98.3 F (36.8 C), Min:98.3 F (36.8 C), Max:98.3 F (36.8 C)  No results for input(s): WBC, CREATININE, LATICACIDVEN, VANCOTROUGH, VANCOPEAK, VANCORANDOM, GENTTROUGH, GENTPEAK, GENTRANDOM, TOBRATROUGH, TOBRAPEAK, TOBRARND, AMIKACINPEAK, AMIKACINTROU, AMIKACIN in the last 168 hours.  CrCl cannot be calculated (Patient's most recent lab result is older than the maximum 21 days allowed.).    Allergies  Allergen Reactions  . Sulfa Antibiotics   . Penicillins Rash    Antimicrobials this admission: 8/17 clindamycin >> x1 at [facility] 8/18 meropenem >>  8/18 vancomycin >>  Dose adjustments this admission:   Microbiology results:  BCx:   UCx:    Sputum:    MRSA PCR:   Thank you for allowing pharmacy to be a part of this patient's care.  Alice Kemp 05/24/2019 6:07 AM

## 2019-05-24 NOTE — Progress Notes (Addendum)
Please see H&P from earlier this morning.  68 year old female with multiple medical problems including chronic back pain/radiculopathy, chronic diastolic CHF, type 2 diabetes, hypothyroidism, obstructive sleep apnea, pulmonary hypertension presented to the ED with complaints of worsening back pain and neuropathy pain in lower extremities.  She was noted to have a abdominal wound precipitated by scratching on a sore over the last week.  She is admitted with IV antibiotics for abdominal cellulitis (meropenem and vancomycin) and with IV fluids for AKI.  Patient on exam has a open wound with significant bloody/pustular drainage on the dressing.   Patient also reported taking care of her mother who had coronavirus infection in June.  She apparently did have upper respiratory symptoms at that time and her PCP recommended to self isolate for presumed coronavirus infection given close contact.  She states she self isolated herself for about 4 weeks and her symptoms resolved.  Repeat COVID testing was sent from [facility] which resulted positive this morning and repeat testing sent from [facility] also resulted positive this afternoon.  Patient however saturating well on room air.  Plan:  Will request wound care evaluation.  Patient was very irritable regarding pain medications for her back pain/neuropathy and did not let me obtain wound pictures.  She states she primarily came for back pain and does not care about the wound or COVID positive status.  Will de-escalate antibiotics at this time.  Continue to hold Lasix, losartan, spironolactone.  Continue gentle IV hydration, labs in a.m.  Resume 70/30 insulin at a lower dose as blood glucose less than 200.  I suspect her blood glucose will start rising as oral intake improves.  She is likely diet noncompliant at home.  Confirm home dose of insulin in a.m. HgbAIC 8.9  Discussed with [place] hospitalist Dr. Candiss Norse, who stated wound care services not available at [place] and recommended to continue current care here especially as patient doing well clinically with no respiratory symptoms or hypoxia.  Accordingly, steroids/Remdesevir not initiated currently but can be considered if clinical condition worsens or she develops hypoxia.    She was taking tramadol for pain at home, changed to Norco PRN.  Reconciled all other home medications (need clarity on insulin and Synthroid dosing)

## 2019-05-24 NOTE — Progress Notes (Signed)
Dr. Earnest Conroy rounding on Pt. Updated on Positive Covid results.

## 2019-05-24 NOTE — Progress Notes (Signed)
Patient was admitted from [place] admissions on [place] was notified

## 2019-05-25 LAB — COMPREHENSIVE METABOLIC PANEL
ALT: 11 U/L (ref 0–44)
AST: 16 U/L (ref 15–41)
Albumin: 2.5 g/dL — ABNORMAL LOW (ref 3.5–5.0)
Alkaline Phosphatase: 42 U/L (ref 38–126)
Anion gap: 8 (ref 5–15)
BUN: 40 mg/dL — ABNORMAL HIGH (ref 8–23)
CO2: 21 mmol/L — ABNORMAL LOW (ref 22–32)
Calcium: 8.2 mg/dL — ABNORMAL LOW (ref 8.9–10.3)
Chloride: 105 mmol/L (ref 98–111)
Creatinine, Ser: 2.54 mg/dL — ABNORMAL HIGH (ref 0.44–1.00)
GFR calc Af Amer: 22 mL/min — ABNORMAL LOW (ref >60)
GFR calc non Af Amer: 19 mL/min — ABNORMAL LOW (ref >60)
Glucose, Bld: 147 mg/dL — ABNORMAL HIGH (ref 70–99)
Potassium: 4.8 mmol/L (ref 3.5–5.1)
Sodium: 134 mmol/L — ABNORMAL LOW (ref 135–145)
Total Bilirubin: 0.3 mg/dL (ref 0.3–1.2)
Total Protein: 6.3 g/dL — ABNORMAL LOW (ref 6.5–8.1)

## 2019-05-25 LAB — CBC
HCT: 29.5 % — ABNORMAL LOW (ref 36.0–46.0)
Hemoglobin: 9.1 g/dL — ABNORMAL LOW (ref 12.0–15.0)
MCH: 29.8 pg (ref 26.0–34.0)
MCHC: 30.8 g/dL (ref 30.0–36.0)
MCV: 96.7 fL (ref 80.0–100.0)
Platelets: 210 10*3/uL (ref 150–400)
RBC: 3.05 MIL/uL — ABNORMAL LOW (ref 3.87–5.11)
RDW: 15.2 % (ref 11.5–15.5)
WBC: 11.7 10*3/uL — ABNORMAL HIGH (ref 4.0–10.5)
nRBC: 0 % (ref 0.0–0.2)

## 2019-05-25 LAB — GLUCOSE, CAPILLARY
Glucose-Capillary: 130 mg/dL — ABNORMAL HIGH (ref 70–99)
Glucose-Capillary: 217 mg/dL — ABNORMAL HIGH (ref 70–99)
Glucose-Capillary: 137 mg/dL — ABNORMAL HIGH (ref 70–99)
Glucose-Capillary: 129 mg/dL — ABNORMAL HIGH (ref 70–99)

## 2019-05-25 LAB — HIV ANTIBODY (ROUTINE TESTING W REFLEX): HIV Screen 4th Generation wRfx: NONREACTIVE

## 2019-05-25 NOTE — Consult Note (Signed)
Consultation completed by use of  assistance from bedside nurse/clinical staff-trained as a wound treatment associate and use of ipad technology   Reason for Consult:cellulitis Wound type: cellulitis with superimposed excoriation (patient self inflicted) Pressure Injury POA: Yes/No/NA Measurement: see bedside nursing notes Wound bed: discussed with WTA, reported as superficial areas scattered over the lower portion of the patient's pannus, some are scabbed some are open pink, clean.  Evidence of patient scratching area and noted nails to be soiled with drainage or skin Drainage (amount, consistency, odor) minimal, non purulent  Periwound: intact  Dressing procedure/placement/frequency: Single layer of xeroform, top with ABD pad. This will keep patient from [place] with the affected area as well as protect open areas.   Discussed POC with bedside nurse.  Re consult if needed, will not follow at this time. Thanks  Deante Blough [employer], RN,CWOCN, CNS, [place] [phone])

## 2019-05-25 NOTE — Consult Note (Addendum)
I have placed a request via Secure Chat to Dr. Marylyn Ishihara requesting photos of the wound areas of concern to be placed in the EMR.  Telehealth wound consultation will be needed due to +COVID test results.   [place], [place], [place]

## 2019-05-25 NOTE — Plan of Care (Signed)
Anxiety has diminished since the beginning of hospitalisation

## 2019-05-25 NOTE — Progress Notes (Signed)
Alice Kemp Kitchen  PROGRESS NOTE    Alice Kemp  OFB:[id number] DOB: 25-Feb-1951 DOA: 05/24/2019 PCP: [name], MD   Brief Narrative:   Alice Kemp is a 69 y.o. female with medical history significant of anxiety, depression, osteoarthritis, chronic back pain, benign colon neoplasm, history of left breast abscess, cervical radiculopathy, chronic kidney disease, constipation, type 2 diabetes, chronic diastolic heart failure, glaucoma, history of gout, hyperlipidemia, hypothyroidism, morbid obesity, obstructive sleep apnea, pericardial effusion, pulmonary hypertension, vitamin B12 and vitamin D deficiency who is transferred from [facility] emergency department to this facility after presenting there with progressively worse lower back pain with worsening lower extremity weakness, generalized weakness for several days, multiple episodes of emesis yesterday, decreased oral intake, worsening right-sided lower abdomen wounds.initially began after the patient scratched the area following having local pruritus. She denies fever, but complains of chills, nausea and emesis. She has been lightheaded. No sore throat, rhinorrhea, wheezing, hemoptysis, dyspnea, chest pain, palpitations, diaphoresis, orthopnea or recent pitting edema of the lower extremities. Denies diarrhea, melena or hematochezia. No dysuria, frequency or hematuria. Denies polyuria, polydipsia, polyphagia or blurred vision.    Assessment & Plan:   Principal Problem:   Cellulitis of abdominal wall Active Problems:   Uncontrolled type 2 diabetes mellitus with stage 3 chronic kidney disease (HCC)   Morbid obesity (HCC)   Hypothyroidism   Chronic diastolic heart failure (HCC)   Essential hypertension   AKI (acute kidney injury) ([place])   Hyponatremia   Anemia   AKI (acute kidney injury) ([place])     - SCr back in January was 1.6; it's 2.47 at admission, 2.54 today     - on IVF 100c/hr     - monitor I&O, daily weights     - UA ok, UOP  ok  Hyponatremia     - Secondary to diuretic use and GI losses.     - Continue time-limited and gentle NS infusion.     - stable at 134; monitor  Anemia, normocytic      - denies blood in stool, or any other frank blood; no evidence of acute bleed     - iron panel ok     - Monitor H&H; stable this AM  Uncontrolled type 2 diabetes mellitus with stage 3 chronic kidney disease ([place])     - Carbohydrate modified diet.     - CBG monitoring with RI SS.     - reports that she isn't always compliant with her medications     - A1c 8.9  Hypothyroidism     - TSH 0.819; continue home levothyroxine  Chronic diastolic heart failure (HCC)     - No signs of decompensation.     - Holding diuretics and antihypertensives.     - Monitor fluid status closely; daily weights  Essential hypertension     - Hold diuretics and ARB.     - Monitor blood pressure, renal function electrolytes.  Morbid obesity ([place])     - Needs significant lifestyle modifications.  COVID 19+     - COVID swab positive     - currently on RA w/o hypoxia or respiratory symptoms     - Per chart review, she had symptoms back towards the end of June and reported to her PCP; the patient had exposure to someone who was positive at the time; the patient declined testing at the time     - previous physician d/w with [facility], was told she needed to stay  at [facility] d/c wound care not coming to [facility]  Abdominal wound/cellulitis     - wound care eval pending; to be conducted through telemed     - currently on rocephin, continue for now  Chronic back pain/leg pain/weakness/falls     - c/o weakness leading to 2 falls POA     - Hx of chronic back pain, w/ "buldging discs and arthritis" per pt     - BLE MSK strength is 5/5 and sensation intact b/l     - PT consult     - consider imaging MR/CT   DVT prophylaxis: lovenox Code Status: FULL Family Communication: None at bedside   Disposition Plan: TBD  Antimicrobials:  . Rocephin    ROS:  C/o back pain, leg pain. Denies CP, dyspnea, N, V, lightheadedness, dizziness . Remainder 10-pt ROS is negative for all not previously mentioned.  Subjective: "My back hurts."  Objective: Vitals:   05/24/19 0815 05/24/19 1146 05/24/19 2100 05/25/19 0435  BP:  (!) 113/59 (!) 110/49 128/63  Pulse:  71 74 69  Resp:  16 (!) 21 18  Temp:  98.6 F (37 C) 98.2 F (36.8 C) 97.6 F (36.4 C)  TempSrc:  Oral Oral Oral  SpO2: 98% 98% 95% 100%  Weight:    116.2 kg  Height:        Intake/Output Summary (Last 24 hours) at 05/25/2019 0949 Last data filed at 05/25/2019 7902 Gross per 24 hour  Intake 908.74 ml  Output 2975 ml  Net -2066.26 ml   Filed Weights   05/24/19 0348 05/25/19 0435  Weight: 116.2 kg 116.2 kg    Examination:  General: 68 y.o. female resting in bed in NAD Eyes: PERRL, normal sclera ENMT: Nares patent w/o discharge, orophaynx clear, dentition normal, ears w/o discharge/lesions/ulcers Cardiovascular: RRR, +S1, S2, no m/g/r, equal pulses throughout Respiratory: CTABL, no w/r/r, normal WOB GI: BS+, NDNT, no masses noted, no organomegaly noted, abdominal bandaging CDI MSK: No e/c/c Skin: No rashes, bruises, ulcerations noted Neuro: A&O x 3, no focal deficits Psyc: Appropriate interaction and affect, calm/cooperative   Data Reviewed: I have personally reviewed following labs and imaging studies.  CBC: Recent Labs  Lab 05/24/19 0838 05/25/19 0412  WBC 10.0 11.7*  NEUTROABS 5.4  --   HGB 9.3* 9.1*  HCT 29.3* 29.5*  MCV 94.2 96.7  PLT 225 409   Basic Metabolic Panel: Recent Labs  Lab 05/24/19 0838 05/25/19 0412  NA 134* 134*  K 4.5 4.8  CL 103 105  CO2 24 21*  GLUCOSE 179* 147*  BUN 40* 40*  CREATININE 2.47* 2.54*  CALCIUM 8.4* 8.2*   GFR: Estimated Creatinine Clearance: 26.5 mL/min (A) (by C-G formula based on SCr of 2.54 mg/dL (H)). Liver Function Tests: Recent Labs  Lab 05/24/19 0838 05/25/19 0412  AST 16 16  ALT 12 11  ALKPHOS 44  42  BILITOT 0.7 0.3  PROT 6.7 6.3*  ALBUMIN 2.8* 2.5*   No results for input(s): LIPASE, AMYLASE in the last 168 hours. No results for input(s): AMMONIA in the last 168 hours. Coagulation Profile: No results for input(s): INR, PROTIME in the last 168 hours. Cardiac Enzymes: No results for input(s): CKTOTAL, CKMB, CKMBINDEX, TROPONINI in the last 168 hours. BNP (last 3 results) No results for input(s): PROBNP in the last 8760 hours. HbA1C: Recent Labs    05/24/19 0838  HGBA1C 8.9*   CBG: Recent Labs  Lab 05/24/19 0819 05/24/19 1120 05/24/19 1640 05/24/19 2244 05/25/19 0919  GLUCAP 147* 168* 216* 121* 130*   Lipid Profile: No results for input(s): CHOL, HDL, LDLCALC, TRIG, CHOLHDL, LDLDIRECT in the last 72 hours. Thyroid Function Tests: Recent Labs    05/24/19 0838  TSH 0.819   Anemia Panel: Recent Labs    05/24/19 0838  VITAMINB12 251  FOLATE 9.7  FERRITIN 81  TIBC 292  IRON 78  RETICCTPCT 1.9   Sepsis Labs: No results for input(s): PROCALCITON, LATICACIDVEN in the last 168 hours.  Recent Results (from the past 240 hour(s))  SARS Coronavirus 2 [facility] order, Performed in [facility] hospital lab) Nasopharyngeal Nasopharyngeal Swab     Status: Abnormal   Collection Time: 05/24/19  4:50 AM   Specimen: Nasopharyngeal Swab  Result Value Ref Range Status   SARS Coronavirus 2 POSITIVE (A) NEGATIVE Final    Comment: RESULT CALLED TO, READ BACK BY AND VERIFIED WITH: JENNIFER HUFF, RN [id number] @ [id number] BY J SCOTTON (NOTE) If result is NEGATIVE SARS-CoV-2 target nucleic acids are NOT DETECTED. The SARS-CoV-2 RNA is generally detectable in upper and lower  respiratory specimens during the acute phase of infection. The lowest  concentration of SARS-CoV-2 viral copies this assay can detect is 250  copies / mL. A negative result does not preclude SARS-CoV-2 infection  and should not be used as the sole basis for treatment or other  patient management decisions.  A  negative result may occur with  improper specimen collection / handling, submission of specimen other  than nasopharyngeal swab, presence of viral mutation(s) within the  areas targeted by this assay, and inadequate number of viral copies  (<250 copies / mL). A negative result must be combined with clinical  observations, patient history, and epidemiological information. If result is POSITIVE SARS-CoV-2 target nucleic acids are DET ECTED. The SARS-CoV-2 RNA is generally detectable in upper and lower  respiratory specimens during the acute phase of infection.  Positive  results are indicative of active infection with SARS-CoV-2.  Clinical  correlation with patient history and other diagnostic information is  necessary to determine patient infection status.  Positive results do  not rule out bacterial infection or co-infection with other viruses. If result is PRESUMPTIVE POSTIVE SARS-CoV-2 nucleic acids MAY BE PRESENT.   A presumptive positive result was obtained on the submitted specimen  and confirmed on repeat testing.  While 2019 novel coronavirus  (SARS-CoV-2) nucleic acids may be present in the submitted sample  additional confirmatory testing may be necessary for epidemiological  and / or clinical management purposes  to differentiate between  SARS-CoV-2 and other Sarbecovirus currently known to infect humans.  If clinically indicated additional testing with an alternate test  methodology ([place]) is advised. The SARS-CoV-2 RNA is generally  detectable in upper and lower respiratory specimens during the acute  phase of infection. The expected result is Negative. Fact Sheet for Patients:  StrictlyIdeas.no Fact Sheet for Healthcare Providers: BankingDealers.co.za This test is not yet approved or cleared by the [place] FDA and has been authorized for detection and/or diagnosis of SARS-CoV-2 by FDA under an Emergency Use  Authorization (EUA).  This EUA will remain in effect (meaning this test can be used) for the duration of the COVID-19 declaration under Section 564(b)(1) of the Act, 21 U.S.C. section 360bbb-3(b)(1), unless the authorization is terminated or revoked sooner. Performed at [facility], [place] [address], [place], [place] [address]       Radiology Studies: No results found.   Scheduled Meds: . allopurinol  100 mg Oral Daily  . aspirin EC  81 mg Oral Daily  . buPROPion  200 mg Oral Daily  . DULoxetine  30 mg Oral BID  . enoxaparin (LOVENOX) injection  30 mg Subcutaneous Daily  . gabapentin  300 mg Oral TID  . insulin aspart  0-15 Units Subcutaneous TID WC  . insulin aspart protamine- aspart  8 Units Subcutaneous BID WC  . levothyroxine  88 mcg Oral QAC breakfast  . loratadine  10 mg Oral QHS  . magnesium oxide  200 mg Oral Daily  . rosuvastatin  5 mg Oral Daily  . vitamin B-12  500 mcg Oral BID   Continuous Infusions: . sodium chloride 75 mL/hr at 05/24/19 2114  . cefTRIAXone (ROCEPHIN)  IV 2 g (05/24/19 1656)  . methocarbamol (ROBAXIN) IV 500 mg (05/24/19 2316)     LOS: 1 day    Time spent: 25 minutes spent in the coordination of care today.   Jonnie Finner, DO [facility] Pager [phone]  If 7PM-7AM, please contact night-coverage [email or web address] Password [facility] 05/25/2019, 9:49 AM

## 2019-05-26 LAB — D-DIMER, QUANTITATIVE: D-Dimer, Quant: 1.34 ug/mL-FEU — ABNORMAL HIGH (ref 0.00–0.50)

## 2019-05-26 LAB — CBC WITH DIFFERENTIAL/PLATELET
Abs Immature Granulocytes: 0.06 10*3/uL (ref 0.00–0.07)
Basophils Absolute: 0.1 10*3/uL (ref 0.0–0.1)
Basophils Relative: 1 %
Eosinophils Absolute: 0.3 10*3/uL (ref 0.0–0.5)
Eosinophils Relative: 3 %
HCT: 28.2 % — ABNORMAL LOW (ref 36.0–46.0)
Hemoglobin: 8.6 g/dL — ABNORMAL LOW (ref 12.0–15.0)
Immature Granulocytes: 1 %
Lymphocytes Relative: 30 %
Lymphs Abs: 3.4 10*3/uL (ref 0.7–4.0)
MCH: 29.8 pg (ref 26.0–34.0)
MCHC: 30.5 g/dL (ref 30.0–36.0)
MCV: 97.6 fL (ref 80.0–100.0)
Monocytes Absolute: 1.2 10*3/uL — ABNORMAL HIGH (ref 0.1–1.0)
Monocytes Relative: 11 %
Neutro Abs: 6.1 10*3/uL (ref 1.7–7.7)
Neutrophils Relative %: 54 %
Platelets: 225 10*3/uL (ref 150–400)
RBC: 2.89 MIL/uL — ABNORMAL LOW (ref 3.87–5.11)
RDW: 14.9 % (ref 11.5–15.5)
WBC: 11.1 10*3/uL — ABNORMAL HIGH (ref 4.0–10.5)
nRBC: 0 % (ref 0.0–0.2)

## 2019-05-26 LAB — FERRITIN: Ferritin: 63 ng/mL (ref 11–307)

## 2019-05-26 LAB — RENAL FUNCTION PANEL
Albumin: 2.5 g/dL — ABNORMAL LOW (ref 3.5–5.0)
Anion gap: 11 (ref 5–15)
BUN: 32 mg/dL — ABNORMAL HIGH (ref 8–23)
CO2: 19 mmol/L — ABNORMAL LOW (ref 22–32)
Calcium: 8.2 mg/dL — ABNORMAL LOW (ref 8.9–10.3)
Chloride: 101 mmol/L (ref 98–111)
Creatinine, Ser: 1.97 mg/dL — ABNORMAL HIGH (ref 0.44–1.00)
GFR calc Af Amer: 30 mL/min — ABNORMAL LOW (ref >60)
GFR calc non Af Amer: 25 mL/min — ABNORMAL LOW (ref >60)
Glucose, Bld: 168 mg/dL — ABNORMAL HIGH (ref 70–99)
Phosphorus: 4 mg/dL (ref 2.5–4.6)
Potassium: 5.1 mmol/L (ref 3.5–5.1)
Sodium: 131 mmol/L — ABNORMAL LOW (ref 135–145)

## 2019-05-26 LAB — MAGNESIUM: Magnesium: 1.8 mg/dL (ref 1.7–2.4)

## 2019-05-26 LAB — GLUCOSE, CAPILLARY
Glucose-Capillary: 135 mg/dL — ABNORMAL HIGH (ref 70–99)
Glucose-Capillary: 153 mg/dL — ABNORMAL HIGH (ref 70–99)
Glucose-Capillary: 159 mg/dL — ABNORMAL HIGH (ref 70–99)
Glucose-Capillary: 149 mg/dL — ABNORMAL HIGH (ref 70–99)

## 2019-05-26 LAB — C-REACTIVE PROTEIN: CRP: 11.9 mg/dL — ABNORMAL HIGH (ref <1.0)

## 2019-05-26 MED ORDER — CEPHALEXIN 500 MG PO CAPS
500.0000 mg | ORAL_CAPSULE | Freq: Two times a day (BID) | ORAL | Status: AC
  Administered 2019-05-26 – 2019-06-02 (×14): 500 mg via ORAL
  Filled 2019-05-26 (×15): qty 1

## 2019-05-26 MED ORDER — ENOXAPARIN SODIUM 40 MG/0.4ML ~~LOC~~ SOLN
40.0000 mg | Freq: Every day | SUBCUTANEOUS | Status: DC
  Administered 2019-05-27 – 2019-05-29 (×3): 40 mg via SUBCUTANEOUS
  Filled 2019-05-26 (×3): qty 0.4

## 2019-05-26 NOTE — Evaluation (Signed)
Physical Therapy Evaluation Patient Details Name: Alice Kemp MRN: [id number] DOB: 05-19-1951 Today's Date: 05/26/2019   History of Present Illness  68 y.o. female with medical history significant of anxiety, depression, osteoarthritis, chronic back pain, benign colon neoplasm, history of left breast abscess, cervical radiculopathy, chronic kidney disease, constipation, type 2 diabetes, chronic diastolic heart failure, glaucoma, history of gout, hyperlipidemia, hypothyroidism, morbid obesity, obstructive sleep apnea, pericardial effusion, pulmonary hypertension, vitamin B12 and vitamin D deficiency and presented with progressively worse lower back pain with worsening lower extremity weakness, generalized weakness for several days, multiple episodes of emesis yesterday, decreased oral intake, worsening right-sided lower abdomen wounds.  Pt found to have cellulitis of abdominal wall and COVID testing came back positive.  Clinical Impression  Pt admitted with above diagnosis.  Pt currently with functional limitations due to the deficits listed below (see PT Problem List). Pt will benefit from skilled PT to increase their independence and safety with mobility to allow discharge to the venue listed below.  Pt report back pain, lateral hip pain and "heavy" legs limiting mobility at this time.  Pt unable to stand and required max assist for bed mobility.  Pt declines SNF and prefers home with HHPT.  Pt lives alone and takes care of her 88 year old mother who lives down the road from her.  Recommending SNF at this time.     Follow Up Recommendations SNF    Equipment Recommendations  Other (comment)(TBD, if home, may need w/c)    Recommendations for Other Services       Precautions / Restrictions Precautions Precautions: Fall      Mobility  Bed Mobility Overal bed mobility: Needs Assistance Bed Mobility: Supine to Sit;Sit to Supine     Supine to sit: Max assist Sit to supine: Max assist    General bed mobility comments: verbal cues for technique, assist for lower body and scooting to EOB; assist for LEs onto bed and repositioning once in supine  Transfers                 General transfer comment: pt attempted however unable to stand with RW  Ambulation/Gait                Stairs            Wheelchair Mobility    Modified Rankin (Stroke Patients Only)       Balance Overall balance assessment: Needs assistance;History of Falls Sitting-balance support: No upper extremity supported;Feet supported Sitting balance-Leahy Scale: Fair                                       Pertinent Vitals/Pain Pain Assessment: 0-10 Pain Score: 8  Pain Location: back pain, thigh muscle spasms Pain Descriptors / Indicators: Guarding;Sore;Shooting Pain Intervention(s): Limited activity within patient's tolerance;Monitored during session;Premedicated before session;Repositioned    Home Living Family/patient expects to be discharged to:: Private residence Living Arrangements: Alone   Type of Home: House Home Access: Stairs to enter   Technical brewer of Steps: 2-3 Home Layout: One level [place]: Environmental consultant - 2 wheels      Prior Function Level of Independence: Independent               Hand Dominance        Extremity/Trunk Assessment        Lower Extremity Assessment Lower Extremity Assessment: RLE deficits/detail;LLE deficits/detail RLE Deficits /  Details: body habitus limiting hip movement, bil LEs grossly 2+/5 (pt unable to complete full range against gravity) LLE Deficits / Details: body habitus limiting hip movement, bil LEs grossly 2+/5 (pt unable to complete full range against gravity)       Communication   Communication: No difficulties  Cognition Arousal/Alertness: Awake/alert Behavior During Therapy: WFL for tasks assessed/performed Overall Cognitive Status: Within Functional Limits for tasks assessed                                         General Comments      Exercises     Assessment/Plan    PT Assessment Patient needs continued PT services  PT Problem List Decreased strength;Decreased mobility;Decreased activity tolerance;Decreased knowledge of use of DME;Decreased balance;Pain;Obesity       PT Treatment Interventions DME instruction;Therapeutic exercise;Gait training;Balance training;Functional mobility training;Therapeutic activities;Patient/family education    PT Goals (Current goals can be found in the Care Plan section)  Acute Rehab PT Goals PT Goal Formulation: With patient Time For Goal Achievement: 06/09/19 Potential to Achieve Goals: Fair    Frequency Min 2X/week   Barriers to discharge        Co-evaluation               AM-PAC PT "6 Clicks" Mobility  Outcome Measure Help needed turning from your back to your side while in a flat bed without using bedrails?: A Lot Help needed moving from lying on your back to sitting on the side of a flat bed without using bedrails?: A Lot Help needed moving to and from a bed to a chair (including a wheelchair)?: Total Help needed standing up from a chair using your arms (e.g., wheelchair or bedside chair)?: Total Help needed to walk in hospital room?: Total Help needed climbing 3-5 steps with a railing? : Total 6 Click Score: 8    End of Session   Activity Tolerance: Patient limited by pain Patient left: in bed;with call bell/phone within reach;with bed alarm set Nurse Communication: Mobility status;Patient requests pain meds PT Visit Diagnosis: Other abnormalities of gait and mobility (R26.89);Repeated falls (R29.6)    Time: [id number] PT Time Calculation (min) (ACUTE ONLY): 23 min   Charges:   PT Evaluation $PT Eval Low Complexity: [place], PT, DPT Acute Rehabilitation Services Office: [phone] Pager: [phone]  Trena Platt 05/26/2019, 3:38 PM

## 2019-05-26 NOTE — Plan of Care (Signed)
Pt's sats were 99 % on RA. Pt denied needing anything for anxiety before bed.

## 2019-05-26 NOTE — Progress Notes (Signed)
Marland Kitchen  PROGRESS NOTE    Alice Kemp  XIP:[id number] DOB: 12-04-1950 DOA: 05/24/2019 PCP: [name], MD   Brief Narrative:   Alice Kemp is a 68 y.o. female with medical history significant of anxiety, depression, osteoarthritis, chronic back pain, benign colon neoplasm, history of left breast abscess, cervical radiculopathy, chronic kidney disease, constipation, type 2 diabetes, chronic diastolic heart failure, glaucoma, history of gout, hyperlipidemia, hypothyroidism, morbid obesity, obstructive sleep apnea, pericardial effusion, pulmonary hypertension, vitamin B12 and vitamin D deficiency who is transferred from [facility] emergency department to this facility after presenting there with progressively worse lower back pain with worsening lower extremity weakness, generalized weakness for several days, multiple episodes of emesis yesterday, decreased oral intake, worsening right-sided lower abdomen wounds.initially began after the patient scratched the area following having local pruritus. She denies fever, but complains of chills, nausea and emesis. She has been lightheaded. No sore throat, rhinorrhea, wheezing, hemoptysis, dyspnea, chest pain, palpitations, diaphoresis, orthopnea or recent pitting edema of the lower extremities. Denies diarrhea, melena or hematochezia. No dysuria, frequency or hematuria. Denies polyuria, polydipsia, polyphagia or blurred vision.   Assessment & Plan:   Principal Problem:   Cellulitis of abdominal wall Active Problems:   Uncontrolled type 2 diabetes mellitus with stage 3 chronic kidney disease (HCC)   Morbid obesity (HCC)   Hypothyroidism   Chronic diastolic heart failure (HCC)   Essential hypertension   AKI (acute kidney injury) ([place])   Hyponatremia   Anemia   AKI (acute kidney injury) ([place])     - SCr back in January was 1.6; it's 2.47 at admission, 2.54 today     - on IVF 100c/hr     - monitor I&O, daily weights     - UA ok, UOP  ok  Hyponatremia     - Secondary to diuretic use and GI losses.     - Continue time-limited and gentle NS infusion.     - stable at 134; monitor  Anemia, normocytic      - denies blood in stool, or any other frank blood; no evidence of acute bleed     - iron panel ok     - Monitor H&H; stable this AM  Uncontrolled type 2 diabetes mellitus with stage 3 chronic kidney disease ([place])     - Carbohydrate modified diet.     - CBG monitoring with RI SS.     - reports that she isn't always compliant with her medications     - A1c 8.9  Hypothyroidism     - TSH 0.819; continue home levothyroxine  Chronic diastolic heart failure (HCC)     - No signs of decompensation.     - Holding diuretics and antihypertensives.     - Monitor fluid status closely; daily weights  Essential hypertension     - Hold diuretics and ARB.     - Monitor blood pressure, renal function electrolytes.  Morbid obesity ([place])     - Needs significant lifestyle modifications.  COVID 19+     - COVID swab positive     - currently on RA w/o hypoxia or respiratory symptoms     - Per chart review, she had symptoms back towards the end of June and reported to her PCP; the patient had exposure to someone who was positive at the time; the patient declined testing at the time     - previous physician d/w with [facility], was told she needed to stay at  [facility] d/c wound care not coming to [facility]  Abdominal wound/cellulitis     - wound care eval pending; to be conducted through telemed     - currently on rocephin, continue for now     - looks better today; let's transition to keflex  Chronic back pain/leg pain/weakness/falls     - c/o weakness leading to 2 falls POA     - Hx of chronic back pain, w/ "buldging discs and arthritis" per pt     - BLE MSK strength is 5/5 and sensation intact b/l     - PT consult     - Lumbar XR from [facility] w/ DDD     - PT recs SNF; pt initially declined because she said she could not afford it; I told  her that CM is working on options and that this would be best for her; she states she is ok with trying SNF now  Transition abx to PO keflex. PT recs noted. Pt will not do well at home. Has agreed to search for SNF.   DVT prophylaxis: lovenox Code Status: FULL Family Communication: None at bedside   Disposition Plan: TBD   Antimicrobials:  . Keflex   ROS:  C/o back pain. Leg pain is better . Remainder 10-pt ROS is negative for all not previously mentioned.  Subjective: "It looks better. But my back still hurts."  Objective: Vitals:   05/25/19 1257 05/25/19 2223 05/26/19 0646 05/26/19 1207  BP: (!) 99/51 123/74 124/77 122/64  Pulse: 67 79 78 79  Resp: 16 18 18  (!) 21  Temp: 97.8 F (36.6 C) (!) 97.5 F (36.4 C) 98.5 F (36.9 C) 98.4 F (36.9 C)  TempSrc: Oral Oral Oral Oral  SpO2: 98% 99% 93% 93%  Weight:      Height:        Intake/Output Summary (Last 24 hours) at 05/26/2019 1613 Last data filed at 05/26/2019 1100 Gross per 24 hour  Intake 2393.06 ml  Output 1100 ml  Net 1293.06 ml   Filed Weights   05/24/19 0348 05/25/19 0435  Weight: 116.2 kg 116.2 kg    Examination:  General: 67 y.o. female resting in bed in NAD Eyes: PERRL, normal sclera ENMT: Nares patent w/o discharge, orophaynx clear, dentition normal, ears w/o discharge/lesions/ulcers Cardiovascular: RRR, +S1, S2, no m/g/r, equal pulses throughout Respiratory: CTABL, no w/r/r, normal WOB GI: BS+, NDNT, no masses noted, no organomegaly noted, abdominal bandaging CDI MSK: No e/c/c Skin: No rashes, bruises, ulcerations noted Neuro: A&O x 3, no focal deficits Psyc: Appropriate interaction and affect, calm/cooperative   Data Reviewed: I have personally reviewed following labs and imaging studies.  CBC: Recent Labs  Lab 05/24/19 0838 05/25/19 0412 05/26/19 0429  WBC 10.0 11.7* 11.1*  NEUTROABS 5.4  --  6.1  HGB 9.3* 9.1* 8.6*  HCT 29.3* 29.5* 28.2*  MCV 94.2 96.7 97.6  PLT 225 210 353    Basic Metabolic Panel: Recent Labs  Lab 05/24/19 0838 05/25/19 0412 05/26/19 0429  NA 134* 134* 131*  K 4.5 4.8 5.1  CL 103 105 101  CO2 24 21* 19*  GLUCOSE 179* 147* 168*  BUN 40* 40* 32*  CREATININE 2.47* 2.54* 1.97*  CALCIUM 8.4* 8.2* 8.2*  MG  --   --  1.8  PHOS  --   --  4.0   GFR: Estimated Creatinine Clearance: 34.2 mL/min (A) (by C-G formula based on SCr of 1.97 mg/dL (H)). Liver Function Tests: Recent Labs  Lab 05/24/19  6144 05/25/19 0412 05/26/19 0429  AST 16 16  --   ALT 12 11  --   ALKPHOS 44 42  --   BILITOT 0.7 0.3  --   PROT 6.7 6.3*  --   ALBUMIN 2.8* 2.5* 2.5*   No results for input(s): LIPASE, AMYLASE in the last 168 hours. No results for input(s): AMMONIA in the last 168 hours. Coagulation Profile: No results for input(s): INR, PROTIME in the last 168 hours. Cardiac Enzymes: No results for input(s): CKTOTAL, CKMB, CKMBINDEX, TROPONINI in the last 168 hours. BNP (last 3 results) No results for input(s): PROBNP in the last 8760 hours. HbA1C: Recent Labs    05/24/19 0838  HGBA1C 8.9*   CBG: Recent Labs  Lab 05/25/19 1251 05/25/19 1738 05/25/19 2230 05/26/19 0817 05/26/19 1204  GLUCAP 217* 137* 129* 135* 153*   Lipid Profile: No results for input(s): CHOL, HDL, LDLCALC, TRIG, CHOLHDL, LDLDIRECT in the last 72 hours. Thyroid Function Tests: Recent Labs    05/24/19 0838  TSH 0.819   Anemia Panel: Recent Labs    05/24/19 0838 05/26/19 0429  VITAMINB12 251  --   FOLATE 9.7  --   FERRITIN 81 63  TIBC 292  --   IRON 78  --   RETICCTPCT 1.9  --    Sepsis Labs: No results for input(s): PROCALCITON, LATICACIDVEN in the last 168 hours.  Recent Results (from the past 240 hour(s))  SARS Coronavirus 2 [facility] order, Performed in [facility] hospital lab) Nasopharyngeal Nasopharyngeal Swab     Status: Abnormal   Collection Time: 05/24/19  4:50 AM   Specimen: Nasopharyngeal Swab  Result Value Ref Range Status   SARS Coronavirus 2  POSITIVE (A) NEGATIVE Final    Comment: RESULT CALLED TO, READ BACK BY AND VERIFIED WITH: JENNIFER HUFF, RN [id number] @ 3154 BY J SCOTTON (NOTE) If result is NEGATIVE SARS-CoV-2 target nucleic acids are NOT DETECTED. The SARS-CoV-2 RNA is generally detectable in upper and lower  respiratory specimens during the acute phase of infection. The lowest  concentration of SARS-CoV-2 viral copies this assay can detect is 250  copies / mL. A negative result does not preclude SARS-CoV-2 infection  and should not be used as the sole basis for treatment or other  patient management decisions.  A negative result may occur with  improper specimen collection / handling, submission of specimen other  than nasopharyngeal swab, presence of viral mutation(s) within the  areas targeted by this assay, and inadequate number of viral copies  (<250 copies / mL). A negative result must be combined with clinical  observations, patient history, and epidemiological information. If result is POSITIVE SARS-CoV-2 target nucleic acids are DET ECTED. The SARS-CoV-2 RNA is generally detectable in upper and lower  respiratory specimens during the acute phase of infection.  Positive  results are indicative of active infection with SARS-CoV-2.  Clinical  correlation with patient history and other diagnostic information is  necessary to determine patient infection status.  Positive results do  not rule out bacterial infection or co-infection with other viruses. If result is PRESUMPTIVE POSTIVE SARS-CoV-2 nucleic acids MAY BE PRESENT.   A presumptive positive result was obtained on the submitted specimen  and confirmed on repeat testing.  While 2019 novel coronavirus  (SARS-CoV-2) nucleic acids may be present in the submitted sample  additional confirmatory testing may be necessary for epidemiological  and / or clinical management purposes  to differentiate between  SARS-CoV-2 and other Sarbecovirus currently known  to infect  humans.  If clinically indicated additional testing with an alternate test  methodology ([place]) is advised. The SARS-CoV-2 RNA is generally  detectable in upper and lower respiratory specimens during the acute  phase of infection. The expected result is Negative. Fact Sheet for Patients:  StrictlyIdeas.no Fact Sheet for Healthcare Providers: BankingDealers.co.za This test is not yet approved or cleared by the [place] FDA and has been authorized for detection and/or diagnosis of SARS-CoV-2 by FDA under an Emergency Use Authorization (EUA).  This EUA will remain in effect (meaning this test can be used) for the duration of the COVID-19 declaration under Section 564(b)(1) of the Act, 21 U.S.C. section 360bbb-3(b)(1), unless the authorization is terminated or revoked sooner. Performed at [facility], [place] [address] [place], [place] [address]       Radiology Studies: No results found.   Scheduled Meds: . allopurinol  100 mg Oral Daily  . aspirin EC  81 mg Oral Daily  . buPROPion  200 mg Oral Daily  . DULoxetine  30 mg Oral BID  . [START ON 05/27/2019] enoxaparin (LOVENOX) injection  40 mg Subcutaneous Daily  . gabapentin  300 mg Oral TID  . insulin aspart  0-15 Units Subcutaneous TID WC  . insulin aspart protamine- aspart  8 Units Subcutaneous BID WC  . levothyroxine  88 mcg Oral QAC breakfast  . loratadine  10 mg Oral QHS  . magnesium oxide  200 mg Oral Daily  . rosuvastatin  5 mg Oral Daily  . vitamin B-12  500 mcg Oral BID   Continuous Infusions: . sodium chloride 75 mL/hr at 05/26/19 0643  . cefTRIAXone (ROCEPHIN)  IV 2 g (05/25/19 1518)  . methocarbamol (ROBAXIN) IV 500 mg (05/26/19 1556)     LOS: 2 days    Time spent: 35 minutes spent in the coordination of care today.   Jonnie Finner, DO [facility] Pager [phone]  If 7PM-7AM, please contact night-coverage [email or web address]  Password [email or web address] 05/26/2019, 4:13 PM

## 2019-05-26 NOTE — TOC Progression Note (Signed)
Transition of Care [facility]) - Progression Note    Patient Details  Name: Alice Kemp MRN: [id number] Date of Birth: [birth year]  [facility] CM/SW Contact  Purcell Mouton, RN Phone Number: 05/26/2019, 5:09 PM  Clinical Narrative:    Spoke with pt who agreed in going to a SNF for Rehab. PASRR started and FL2 completed.         Expected Discharge Plan and Services                                                 Social Determinants of Health (SDOH) Interventions    Readmission Risk Interventions No flowsheet data found.

## 2019-05-26 NOTE — NC FL2 (Signed)
[place] LEVEL OF CARE SCREENING TOOL     IDENTIFICATION  Patient Name: Alice Kemp Birthdate: 1951-03-19 Sex: female Admission Date (Current Location): 05/24/2019  [facility] and [place] Number:  Herbalist and Address:  [facility],  [place] [address], [place]      Provider Number: [id number]  Attending Physician Name and Address:  Jonnie Finner, DO  Relative Name and Phone Number:       Current Level of Care: Hospital Recommended Level of Care: [place] Prior Approval Number:    Date Approved/Denied:   PASRR Number:    Discharge Plan: SNF    Current Diagnoses: Patient Active Problem List   Diagnosis Date Noted  . Cellulitis of abdominal wall 05/24/2019  . AKI (acute kidney injury) ([place]) 05/24/2019  . Hyponatremia 05/24/2019  . Anemia 05/24/2019  . Suspected Covid-19 Virus Infection 03/28/2019  . Essential hypertension 10/15/2017  . Pain in both lower extremities 01/27/2017  . Lumbar radiculopathy 06/02/2016  . Leg weakness, bilateral 06/02/2016  . Gout 11/29/2015  . Palpitations   . Chronic diastolic heart failure ([place]) 09/23/2013  . CKD (chronic kidney disease), stage III ([place])   . Depression 02/11/2013  . Benign neoplasm of colon 06/29/2012  . Cervical radiculopathy at C6 07/24/2011  . Morbid obesity ([place]) 02/14/2011  . Hypothyroidism 02/14/2011  . OBSTRUCTIVE SLEEP APNEA 12/23/2010  . PERICARDIAL EFFUSION 12/12/2010  . Uncontrolled type 2 diabetes mellitus with stage 3 chronic kidney disease ([place]) 12/02/2010  . EDEMA 12/02/2010  . DYSPNEA 12/02/2010    Orientation RESPIRATION BLADDER Height & Weight     Self, Time, Situation, Place  Normal External catheter Weight: 116.2 kg Height:  5\' 4"  (162.6 cm)  BEHAVIORAL SYMPTOMS/MOOD NEUROLOGICAL BOWEL NUTRITION STATUS      Incontinent Diet(Carb Modified)  AMBULATORY STATUS COMMUNICATION OF NEEDS Skin   Extensive Assist   Other (Comment)(wound  on lower abd)                       Personal Care Assistance Level of Assistance  Bathing, Feeding, Dressing Bathing Assistance: Limited assistance Feeding assistance: Independent Dressing Assistance: Limited assistance     Functional Limitations Info             SPECIAL CARE FACTORS FREQUENCY  PT (By licensed PT), OT (By licensed OT)     PT Frequency: x5 OT Frequency: x5            Contractures Contractures Info: Not present    Additional Factors Info  Code Status Code Status Info: FULL             Current Medications (05/26/2019):  This is the current hospital active medication list Current Facility-Administered Medications  Medication Dose Route Frequency Provider Last Rate Last Dose  . 0.9 %  sodium chloride infusion   Intravenous Continuous Guilford Shi, MD 75 mL/hr at 05/26/19 [phone]    . acetaminophen (TYLENOL) tablet 650 mg  650 mg Oral Q6H PRN Reubin Milan, MD       Or  . acetaminophen (TYLENOL) suppository 650 mg  650 mg Rectal Q6H PRN Reubin Milan, MD      . allopurinol (ZYLOPRIM) tablet 100 mg  100 mg Oral Daily Guilford Shi, MD   100 mg at 05/26/19 0827  . amitriptyline (ELAVIL) tablet 10 mg  10 mg Oral QHS PRN Guilford Shi, MD      . aspirin EC tablet 81 mg  81 mg Oral Daily Guilford Shi, MD   81 mg at 05/26/19 4818  . benzonatate (TESSALON) capsule 100 mg  100 mg Oral TID PRN Guilford Shi, MD      . buPROPion (WELLBUTRIN SR) 12 hr tablet 200 mg  200 mg Oral Daily Guilford Shi, MD   200 mg at 05/26/19 0824  . cephALEXin (KEFLEX) capsule 500 mg  500 mg Oral Q12H Kyle, Tyrone A, DO      . DULoxetine (CYMBALTA) DR capsule 30 mg  30 mg Oral BID Guilford Shi, MD   30 mg at 05/26/19 5631  . [START ON 05/27/2019] enoxaparin (LOVENOX) injection 40 mg  40 mg Subcutaneous Daily Kyle, Tyrone A, DO      . gabapentin (NEURONTIN) capsule 300 mg  300 mg Oral TID Guilford Shi, MD   300 mg at 05/26/19 0823  .  HYDROcodone-acetaminophen (NORCO/VICODIN) 5-325 MG per tablet 1-2 tablet  1-2 tablet Oral Q4H PRN Guilford Shi, MD   2 tablet at 05/26/19 1346  . insulin aspart (novoLOG) injection 0-15 Units  0-15 Units Subcutaneous TID WC Reubin Milan, MD   3 Units at 05/26/19 1241  . insulin aspart protamine- aspart (NOVOLOG MIX 70/30) injection 8 Units  8 Units Subcutaneous BID WC Guilford Shi, MD   8 Units at 05/26/19 0825  . levothyroxine (SYNTHROID) tablet 88 mcg  88 mcg Oral QAC breakfast Guilford Shi, MD   88 mcg at 05/26/19 0823  . loratadine (CLARITIN) tablet 10 mg  10 mg Oral QHS Guilford Shi, MD   10 mg at 05/25/19 2224  . magnesium oxide (MAG-OX) tablet 200 mg  200 mg Oral Daily Guilford Shi, MD   200 mg at 05/26/19 0823  . meclizine (ANTIVERT) tablet 12.5-25 mg  12.5-25 mg Oral TID PRN Guilford Shi, MD      . methocarbamol (ROBAXIN) 500 mg in dextrose 5 % 50 mL IVPB  500 mg Intravenous Q6H PRN Schorr, [name] Mura, NP 100 mL/hr at 05/26/19 1556 500 mg at 05/26/19 1556  . ondansetron (ZOFRAN) tablet 4 mg  4 mg Oral Q6H PRN Reubin Milan, MD       Or  . ondansetron [facility]) injection 4 mg  4 mg Intravenous Q6H PRN Reubin Milan, MD      . rosuvastatin (CRESTOR) tablet 5 mg  5 mg Oral Daily Guilford Shi, MD   5 mg at 05/26/19 4970  . vitamin B-12 (CYANOCOBALAMIN) tablet 500 mcg  500 mcg Oral BID Guilford Shi, MD   500 mcg at 05/26/19 2637     Discharge Medications: Please see discharge summary for a list of discharge medications.  Relevant Imaging Results:  Relevant Lab Results:   Additional Information #SS [id number]  Purcell Mouton, RN

## 2019-05-27 ENCOUNTER — Inpatient Hospital Stay (HOSPITAL_COMMUNITY): Payer: PPO

## 2019-05-27 LAB — GLUCOSE, CAPILLARY
Glucose-Capillary: 126 mg/dL — ABNORMAL HIGH (ref 70–99)
Glucose-Capillary: 124 mg/dL — ABNORMAL HIGH (ref 70–99)
Glucose-Capillary: 157 mg/dL — ABNORMAL HIGH (ref 70–99)
Glucose-Capillary: 153 mg/dL — ABNORMAL HIGH (ref 70–99)

## 2019-05-27 MED ORDER — METHOCARBAMOL 500 MG PO TABS
500.0000 mg | ORAL_TABLET | Freq: Once | ORAL | Status: AC
  Administered 2019-05-27: 500 mg via ORAL
  Filled 2019-05-27: qty 1

## 2019-05-27 MED ORDER — LORAZEPAM 2 MG/ML IJ SOLN
0.5000 mg | Freq: Once | INTRAMUSCULAR | Status: AC
  Administered 2019-05-27: 0.5 mg via INTRAVENOUS
  Filled 2019-05-27: qty 1

## 2019-05-27 MED ORDER — METHOCARBAMOL 1000 MG/10ML IJ SOLN
500.0000 mg | Freq: Once | INTRAVENOUS | Status: AC
  Filled 2019-05-27: qty 5

## 2019-05-27 NOTE — TOC Progression Note (Signed)
Transition of Care [facility]) - Progression Note    Patient Details  Name: Alice Kemp MRN: [id number] Date of Birth: 1951/05/15  [facility]) CM/SW Contact  Purcell Mouton, RN Phone Number: 05/27/2019, 2:01 PM  Clinical Narrative:    Dr. Coralie Carpen [phone] declined SNF at present time and will do peer to peer. Dr. Marylyn Ishihara aware and will call.       Expected Discharge Plan and Services                                                 Social Determinants of Health (SDOH) Interventions    Readmission Risk Interventions No flowsheet data found.

## 2019-05-27 NOTE — TOC Progression Note (Signed)
Transition of Care [facility]) - Progression Note    Patient Details  Name: Alice Kemp MRN: [id number] Date of Birth: Dec 06, 1950  [facility]) CM/SW Contact  Purcell Mouton, RN Phone Number: 05/27/2019, 9:33 AM  Clinical Narrative:    Pt additional information faxed to [place] MUST for PASRR and auth called to [employer] [phone].        Expected Discharge Plan and Services                                                 Social Determinants of Health (SDOH) Interventions    Readmission Risk Interventions No flowsheet data found.

## 2019-05-27 NOTE — TOC Progression Note (Signed)
Transition of Care [facility]) - Progression Note    Patient Details  Name: Alice Kemp MRN: [id number] Date of Birth: 1951-01-29  Transition of Care [facility]) CM/SW Contact  Purcell Mouton, RN Phone Number: 05/27/2019, 2:39 PM  Clinical Narrative:    [place] offered pt a bed and pt agreed. Will check to see if HTA will approve after MRI.        Expected Discharge Plan and Services                                                 Social Determinants of Health (SDOH) Interventions    Readmission Risk Interventions No flowsheet data found.

## 2019-05-27 NOTE — Progress Notes (Signed)
Patient needing MRI completed before transfer to [facility]  Called and spoke with MRI staff, since patient is COVID + will have to make her last on the schedule for the day in order for room to be prepared correctly.

## 2019-05-27 NOTE — Care Management Important Message (Signed)
Important Message  Patient Details IM Letter given to [name] RN to present to the Patient Name: Alice Kemp MRN: [id number] Date of Birth: September 20, 1951   Medicare Important Message Given:  Yes     Kerin Salen 05/27/2019, 10:38 AM

## 2019-05-27 NOTE — Progress Notes (Signed)
Attempted MRI at 849, scanner went down again. Pt will have to go to [facility] for MRI.

## 2019-05-27 NOTE — Progress Notes (Signed)
Alice Kemp  PROGRESS NOTE    Alice Kemp  [id number] DOB: [birth year] DOA: 05/24/2019 PCP: [name], MD   Brief Narrative:   Alice Kemp is a 68 y.o. female with medical history significant of anxiety, depression, osteoarthritis, chronic back pain, benign colon neoplasm, history of left breast abscess, cervical radiculopathy, chronic kidney disease, constipation, type 2 diabetes, chronic diastolic heart failure, glaucoma, history of gout, hyperlipidemia, hypothyroidism, morbid obesity, obstructive sleep apnea, pericardial effusion, pulmonary hypertension, vitamin B12 and vitamin D deficiency who is transferred from [facility] emergency department to this facility after presenting there with progressively worse lower back pain with worsening lower extremity weakness, generalized weakness for several days, multiple episodes of emesis yesterday, decreased oral intake, worsening right-sided lower abdomen wounds.initially began after the patient scratched the area following having local pruritus. She denies fever, but complains of chills, nausea and emesis. She has been lightheaded. No sore throat, rhinorrhea, wheezing, hemoptysis, dyspnea, chest pain, palpitations, diaphoresis, orthopnea or recent pitting edema of the lower extremities. Denies diarrhea, melena or hematochezia. No dysuria, frequency or hematuria. Denies polyuria, polydipsia, polyphagia or blurred vision.   Assessment & Plan:   Principal Problem:   Cellulitis of abdominal wall Active Problems:   Uncontrolled type 2 diabetes mellitus with stage 3 chronic kidney disease (HCC)   Morbid obesity (HCC)   Hypothyroidism   Chronic diastolic heart failure (HCC)   Essential hypertension   AKI (acute kidney injury) ([place])   Hyponatremia   Anemia   AKI (acute kidney injury) ([place]) - SCr back in January was 1.6; it's 2.47 at admission, 2.54 today - on IVF 100c/hr - monitor I&O, daily weights - UA ok, UOP  ok  Hyponatremia - Secondary to diuretic use and GI losses. - Continue time-limited and gentle NS infusion. - stable at 134; monitor  Anemia, normocytic  - denies blood in stool, or any other frank blood; no evidence of acute bleed - iron panel ok - Monitor H&H; stable this AM  Uncontrolled type 2 diabetes mellitus with stage 3 chronic kidney disease ([place]) - Carbohydrate modified diet. - CBG monitoring with RI SS. - reports that she isn't always compliant with her medications - A1c 8.9  Hypothyroidism - TSH 0.819; continue home levothyroxine  Chronic diastolic heart failure (HCC) - No signs of decompensation. - Holding diuretics and antihypertensives. - Monitor fluid status closely; daily weights  Essential hypertension - Hold diuretics and ARB. - Monitor blood pressure, renal function electrolytes.  Morbid obesity ([place]) - Needs significant lifestyle modifications.  COVID 19+ - COVID swab positive - currently on RA w/o hypoxia or respiratory symptoms - Per chart review, she had symptoms back towards the end of June and reported to her PCP; the patient had exposure to someone who was positive at the time; the patient declined testing at the time - previous physician d/w with [facility], was told she needed to stay at [facility] d/c wound care not coming to [facility]  Abdominal wound/cellulitis - wound care eval pending; to be conducted through telemed - currently on rocephin, continue for now     - looks better today; let's transition to keflex     - now on keflex, stable  Chronic back pain/leg pain/weakness/falls - c/o weakness leading to 2 falls POA - Hx of chronic back pain, w/ "buldging discs and arthritis" per pt - BLE MSK strength is 5/5 and sensation intact b/l - PT consult - [facility]bar [facility]R from [facility] w/ DDD     -  PT recs SNF; pt initially declined because she said  she could not afford it; I told her that CM is working on options and that this would be best for her; she states she is ok with trying SNF now     - MR lumbar ordered for continued back pain  DVT prophylaxis:lovenox Code Status:FULL Family Communication:None at bedside Disposition Plan:TBD   Antimicrobials:   Keflex   ROS:  Says back is somewhat better but still weak. Remainder 10-pt ROS is negative for all not previously mentioned.  Subjective: "I'll do it. I know I have to."  Objective: Vitals:   05/26/19 1207 05/26/19 1946 05/27/19 0500 05/27/19 1241  BP: 122/64 121/62 106/64 125/67  Pulse: 79 78 72 74  Resp: (!) 21 18 16 17   Temp: 98.4 F (36.9 C) 98.6 F (37 C) 98.9 F (37.2 C) 98.2 F (36.8 C)  TempSrc: Oral Oral Oral Oral  SpO2: 93% 95% 97% 98%  Weight:      Height:        Intake/Output Summary (Last 24 hours) at 05/27/2019 1410 Last data filed at 05/27/2019 1239 Gross per 24 hour  Intake --  Output 2650 ml  Net -2650 ml   Filed Weights   05/24/19 0348 05/25/19 0435  Weight: 116.2 kg 116.2 kg    Examination:  General:67 y.o.femaleresting in bed in NAD Eyes: PERRL, normal sclera ENMT: Nares patent w/o discharge, orophaynx clear, dentition normal, ears w/o discharge/lesions/ulcers Cardiovascular: RRR, +S1, S2, no m/g/r, equal pulses throughout Respiratory: CTABL, no w/r/r, normal WOB GI: BS+, NDNT, no masses noted, no organomegaly noted, abdominal bandaging CDI, erythema much improved MSK: No e/c/c Skin: No rashes, bruises, ulcerations noted Neuro: A&O x 3, no focal deficits Psyc: Appropriate interaction and affect, calm/cooperative   Data Reviewed: I have personally reviewed following labs and imaging studies.  CBC: Recent Labs  Lab 05/24/19 0838 05/25/19 0412 05/26/19 0429  WBC 10.0 11.7* 11.1*  NEUTROABS 5.4  --  6.1  HGB 9.3* 9.1* 8.6*  HCT 29.3* 29.5* 28.2*  MCV 94.2 96.7 97.6  PLT 225 210 [id number]   Basic Metabolic  Panel: Recent Labs  Lab 05/24/19 0838 05/25/19 0412 05/26/19 0429  NA 134* 134* 131*  K 4.5 4.8 5.1  CL 103 105 101  CO2 24 21* 19*  GLUCOSE 179* 147* 168*  BUN 40* 40* 32*  CREATININE 2.47* 2.54* 1.97*  CALCIUM 8.4* 8.2* 8.2*  MG  --   --  1.8  PHOS  --   --  4.0   GFR: Estimated Creatinine Clearance: 34.2 mL/min (A) (by C-G formula based on SCr of 1.97 mg/dL (H)). Liver Function Tests: Recent Labs  Lab 05/24/19 0838 05/25/19 0412 05/26/19 0429  AST 16 16  --   ALT 12 11  --   ALKPHOS 44 42  --   BILITOT 0.7 0.3  --   PROT 6.7 6.3*  --   ALBUMIN 2.8* 2.5* 2.5*   No results for input(s): LIPASE, AMYLASE in the last 168 hours. No results for input(s): AMMONIA in the last 168 hours. Coagulation Profile: No results for input(s): INR, PROTIME in the last 168 hours. Cardiac Enzymes: No results for input(s): CKTOTAL, CKMB, CKMBINDEX, TROPONINI in the last 168 hours. BNP (last 3 results) No results for input(s): PROBNP in the last 8760 hours. HbA1C: No results for input(s): HGBA1C in the last 72 hours. CBG: Recent Labs  Lab 05/26/19 1204 05/26/19 1722 05/26/19 2139 05/27/19 0809 05/27/19 1227  GLUCAP 153*  159* 149* 126* 124*   Lipid Profile: No results for input(s): CHOL, HDL, LDLCALC, TRIG, CHOLHDL, LDLDIRECT in the last 72 hours. Thyroid Function Tests: No results for input(s): TSH, T4TOTAL, FREET4, T3FREE, THYROIDAB in the last 72 hours. Anemia Panel: Recent Labs    05/26/19 0429  FERRITIN 63   Sepsis Labs: No results for input(s): PROCALCITON, LATICACIDVEN in the last 168 hours.  Recent Results (from the past 240 hour(s))  SARS Coronavirus 2 [facility] order, Performed in [facility] hospital lab) Nasopharyngeal Nasopharyngeal Swab     Status: Abnormal   Collection Time: 05/24/19  4:50 AM   Specimen: Nasopharyngeal Swab  Result Value Ref Range Status   SARS Coronavirus 2 POSITIVE (A) NEGATIVE Final    Comment: RESULT CALLED TO, READ BACK BY AND  VERIFIED WITH: JENNIFER HUFF, RN [id number] @ [id number] BY J SCOTTON (NOTE) If result is NEGATIVE SARS-CoV-2 target nucleic acids are NOT DETECTED. The SARS-CoV-2 RNA is generally detectable in upper and lower  respiratory specimens during the acute phase of infection. The lowest  concentration of SARS-CoV-2 viral copies this assay can detect is 250  copies / mL. A negative result does not preclude SARS-CoV-2 infection  and should not be used as the sole basis for treatment or other  patient management decisions.  A negative result may occur with  improper specimen collection / handling, submission of specimen other  than nasopharyngeal swab, presence of viral mutation(s) within the  areas targeted by this assay, and inadequate number of viral copies  (<250 copies / mL). A negative result must be combined with clinical  observations, patient history, and epidemiological information. If result is POSITIVE SARS-CoV-2 target nucleic acids are DET ECTED. The SARS-CoV-2 RNA is generally detectable in upper and lower  respiratory specimens during the acute phase of infection.  Positive  results are indicative of active infection with SARS-CoV-2.  Clinical  correlation with patient history and other diagnostic information is  necessary to determine patient infection status.  Positive results do  not rule out bacterial infection or co-infection with other viruses. If result is PRESUMPTIVE POSTIVE SARS-CoV-2 nucleic acids MAY BE PRESENT.   A presumptive positive result was obtained on the submitted specimen  and confirmed on repeat testing.  While 2019 novel coronavirus  (SARS-CoV-2) nucleic acids may be present in the submitted sample  additional confirmatory testing may be necessary for epidemiological  and / or clinical management purposes  to differentiate between  SARS-CoV-2 and other Sarbecovirus currently known to infect humans.  If clinically indicated additional testing with an alternate test   methodology (Phil Campbell) is advised. The SARS-CoV-2 RNA is generally  detectable in upper and lower respiratory specimens during the acute  phase of infection. The expected result is Negative. Fact Sheet for Patients:  StrictlyIdeas.no Fact Sheet for Healthcare Providers: BankingDealers.co.za This test is not yet approved or cleared by the [place] FDA and has been authorized for detection and/or diagnosis of SARS-CoV-2 by FDA under an Emergency Use Authorization (EUA).  This EUA will remain in effect (meaning this test can be used) for the duration of the COVID-19 declaration under Section 564(b)(1) of the Act, 21 U.S.C. section 360bbb-3(b)(1), unless the authorization is terminated or revoked sooner. Performed at [facility], [place] [address], [place], [place] [address]       Radiology Studies: No results found.   Scheduled Meds:  allopurinol  100 mg Oral Daily   aspirin EC  81 mg Oral Daily  buPROPion  200 mg Oral Daily   cephALEXin  500 mg Oral Q12H   DULoxetine  30 mg Oral BID   enoxaparin (LOVENOX) injection  40 mg Subcutaneous Daily   gabapentin  300 mg Oral TID   insulin aspart  0-15 Units Subcutaneous TID WC   insulin aspart protamine- aspart  8 Units Subcutaneous BID WC   levothyroxine  88 mcg Oral QAC breakfast   loratadine  10 mg Oral QHS   magnesium oxide  200 mg Oral Daily   rosuvastatin  5 mg Oral Daily   vitamin B-12  500 mcg Oral BID   Continuous Infusions:  sodium chloride 75 mL/hr at 05/27/19 0515   methocarbamol (ROBAXIN) IV 500 mg (05/26/19 1556)     LOS: 3 days    Time spent: 25 minutes spent in the coordination of care today.    Jonnie Finner, DO [facility] Pager [phone]  If 7PM-7AM, please contact night-coverage [email or web address] Password [email or web address] 05/27/2019, 2:10 PM

## 2019-05-28 ENCOUNTER — Inpatient Hospital Stay (HOSPITAL_COMMUNITY)
Admission: AD | Admit: 2019-05-28 | Discharge: 2019-05-28 | Disposition: A | Discharge status: Home / Self Care | Payer: PPO | Attending: Internal Medicine | Admitting: Internal Medicine

## 2019-05-28 ENCOUNTER — Ambulatory Visit (HOSPITAL_COMMUNITY)
Admission: AD | Admit: 2019-05-28 | Discharge: 2019-05-28 | Disposition: A | Discharge status: Home / Self Care | Payer: PPO | Attending: Internal Medicine | Admitting: Internal Medicine

## 2019-05-28 DIAGNOSIS — M48061 Spinal stenosis, lumbar region without neurogenic claudication: Secondary | ICD-10-CM | POA: Diagnosis not present

## 2019-05-28 DIAGNOSIS — M5136 Other intervertebral disc degeneration, lumbar region: Secondary | ICD-10-CM | POA: Diagnosis not present

## 2019-05-28 DIAGNOSIS — M5126 Other intervertebral disc displacement, lumbar region: Secondary | ICD-10-CM | POA: Diagnosis not present

## 2019-05-28 LAB — GLUCOSE, CAPILLARY
Glucose-Capillary: 135 mg/dL — ABNORMAL HIGH (ref 70–99)
Glucose-Capillary: 146 mg/dL — ABNORMAL HIGH (ref 70–99)
Glucose-Capillary: 147 mg/dL — ABNORMAL HIGH (ref 70–99)
Glucose-Capillary: 158 mg/dL — ABNORMAL HIGH (ref 70–99)
Glucose-Capillary: 196 mg/dL — ABNORMAL HIGH (ref 70–99)

## 2019-05-28 LAB — D-DIMER, QUANTITATIVE: D-Dimer, Quant: 2.47 ug/mL-FEU — ABNORMAL HIGH (ref 0.00–0.50)

## 2019-05-28 LAB — CBC WITH DIFFERENTIAL/PLATELET
Abs Immature Granulocytes: 0.1 10*3/uL — ABNORMAL HIGH (ref 0.00–0.07)
Basophils Absolute: 0 10*3/uL (ref 0.0–0.1)
Basophils Relative: 0 %
Eosinophils Absolute: 0.4 10*3/uL (ref 0.0–0.5)
Eosinophils Relative: 4 %
HCT: 26.9 % — ABNORMAL LOW (ref 36.0–46.0)
Hemoglobin: 8.3 g/dL — ABNORMAL LOW (ref 12.0–15.0)
Immature Granulocytes: 1 %
Lymphocytes Relative: 32 %
Lymphs Abs: 2.7 10*3/uL (ref 0.7–4.0)
MCH: 29.4 pg (ref 26.0–34.0)
MCHC: 30.9 g/dL (ref 30.0–36.0)
MCV: 95.4 fL (ref 80.0–100.0)
Monocytes Absolute: 0.7 10*3/uL (ref 0.1–1.0)
Monocytes Relative: 8 %
Neutro Abs: 4.6 10*3/uL (ref 1.7–7.7)
Neutrophils Relative %: 55 %
Platelets: 244 10*3/uL (ref 150–400)
RBC: 2.82 MIL/uL — ABNORMAL LOW (ref 3.87–5.11)
RDW: 15.1 % (ref 11.5–15.5)
WBC: 8.4 10*3/uL (ref 4.0–10.5)
nRBC: 0 % (ref 0.0–0.2)

## 2019-05-28 LAB — RENAL FUNCTION PANEL
Albumin: 2.3 g/dL — ABNORMAL LOW (ref 3.5–5.0)
Anion gap: 10 (ref 5–15)
BUN: 29 mg/dL — ABNORMAL HIGH (ref 8–23)
CO2: 20 mmol/L — ABNORMAL LOW (ref 22–32)
Calcium: 8.7 mg/dL — ABNORMAL LOW (ref 8.9–10.3)
Chloride: 105 mmol/L (ref 98–111)
Creatinine, Ser: 1.47 mg/dL — ABNORMAL HIGH (ref 0.44–1.00)
GFR calc Af Amer: 42 mL/min — ABNORMAL LOW (ref >60)
GFR calc non Af Amer: 36 mL/min — ABNORMAL LOW (ref >60)
Glucose, Bld: 147 mg/dL — ABNORMAL HIGH (ref 70–99)
Phosphorus: 4 mg/dL (ref 2.5–4.6)
Potassium: 5.1 mmol/L (ref 3.5–5.1)
Sodium: 135 mmol/L (ref 135–145)

## 2019-05-28 LAB — C-REACTIVE PROTEIN: CRP: 14.9 mg/dL — ABNORMAL HIGH (ref <1.0)

## 2019-05-28 LAB — MAGNESIUM: Magnesium: 1.7 mg/dL (ref 1.7–2.4)

## 2019-05-28 MED ORDER — DOCUSATE SODIUM 100 MG PO CAPS
100.0000 mg | ORAL_CAPSULE | Freq: Two times a day (BID) | ORAL | Status: DC
  Administered 2019-05-28 – 2019-06-05 (×17): 100 mg via ORAL
  Filled 2019-05-28 (×17): qty 1

## 2019-05-28 MED ORDER — LORAZEPAM 2 MG/ML IJ SOLN
0.5000 mg | Freq: Once | INTRAMUSCULAR | Status: AC
  Administered 2019-05-28: 0.5 mg via INTRAVENOUS
  Filled 2019-05-28: qty 1

## 2019-05-28 NOTE — Progress Notes (Signed)
Charge nurse Hinton Dyer called asking for a time for pt to come to [facility] for [facility], I told Hinton Dyer 3:30pm would work if she could coordinate that with [facility]. Said she would call back. Waiting for her to call back so we can get pt's scan done

## 2019-05-28 NOTE — Plan of Care (Signed)
  Problem: Clinical Measurements: Goal: Will remain free from infection Outcome: Progressing Goal: Diagnostic test results will improve Outcome: Progressing Goal: Cardiovascular complication will be avoided Outcome: Progressing   Problem: Activity: Goal: Risk for activity intolerance will decrease Outcome: Progressing   Problem: Coping: Goal: Level of anxiety will decrease Outcome: Progressing   Problem: Elimination: Goal: Will not experience complications related to bowel motility Outcome: Progressing Goal: Will not experience complications related to urinary retention Outcome: Progressing   Problem: Pain Managment: Goal: General experience of comfort will improve Outcome: Progressing   Problem: Safety: Goal: Ability to remain free from injury will improve Outcome: Progressing   Problem: Skin Integrity: Goal: Risk for impaired skin integrity will decrease Outcome: Progressing   Problem: Education: Goal: Knowledge of risk factors and measures for prevention of condition will improve Outcome: Progressing   Problem: Education: Goal: Knowledge of General Education information will improve Description: Including pain rating scale, medication(s)/side effects and non-pharmacologic comfort measures Outcome: Progressing   Problem: Health Behavior/Discharge Planning: Goal: Ability to manage health-related needs will improve Outcome: Progressing   Problem: Clinical Measurements: Goal: Ability to maintain clinical measurements within normal limits will improve Outcome: Progressing Goal: Will remain free from infection Outcome: Progressing Goal: Diagnostic test results will improve Outcome: Progressing Goal: Respiratory complications will improve Outcome: Progressing Goal: Cardiovascular complication will be avoided Outcome: Progressing   Problem: Activity: Goal: Risk for activity intolerance will decrease Outcome: Progressing   Problem: Nutrition: Goal: Adequate  nutrition will be maintained Outcome: Progressing   Problem: Elimination: Goal: Will not experience complications related to bowel motility Outcome: Progressing Goal: Will not experience complications related to urinary retention Outcome: Progressing   Problem: Pain Managment: Goal: General experience of comfort will improve Outcome: Progressing   Problem: Safety: Goal: Ability to remain free from injury will improve Outcome: Progressing   Problem: Skin Integrity: Goal: Risk for impaired skin integrity will decrease Outcome: Progressing

## 2019-05-28 NOTE — Progress Notes (Signed)
2000 Patient took patient down to MRI 0845 [facility] arrives while patient is in MRI.    MRI was unable to scan patient. 0900 Charge nurse calls MRI at  [facility] to see if patient could be worked in soon. [facility] informed Charge nurse to call back in two hours to see if they had any availability. [facility] left and told us to call back when we get the MRI set up at [facility].  [facility]  They told me that it was not an issue of working the patient in it was that we were waiting on [facility]  To transfer the patient.  [place] told me she was on the list to go to [facility] and that they would take her to [facility] first.  [facility] couldn't give me a time because they are busy.

## 2019-05-28 NOTE — Progress Notes (Signed)
Marland Kitchen  PROGRESS NOTE    Alice Kemp  [id number] DOB: 01/10/1951 DOA: 05/24/2019 PCP: [name], MD   Brief Narrative:   Alice Kemp is a 68 y.o. female with medical history significant of anxiety, depression, osteoarthritis, chronic back pain, benign colon neoplasm, history of left breast abscess, cervical radiculopathy, chronic kidney disease, constipation, type 2 diabetes, chronic diastolic heart failure, glaucoma, history of gout, hyperlipidemia, hypothyroidism, morbid obesity, obstructive sleep apnea, pericardial effusion, pulmonary hypertension, vitamin B12 and vitamin D deficiency who is transferred from [facility] emergency department to this facility after presenting there with progressively worse lower back pain with worsening lower extremity weakness, generalized weakness for several days, multiple episodes of emesis yesterday, decreased oral intake, worsening right-sided lower abdomen wounds.initially began after the patient scratched the area following having local pruritus. She denies fever, but complains of chills, nausea and emesis. She has been lightheaded. No sore throat, rhinorrhea, wheezing, hemoptysis, dyspnea, chest pain, palpitations, diaphoresis, orthopnea or recent pitting edema of the lower extremities. Denies diarrhea, melena or hematochezia. No dysuria, frequency or hematuria. Denies polyuria, polydipsia, polyphagia or blurred vision.   Assessment & Plan:   Principal Problem:   Cellulitis of abdominal wall Active Problems:   Uncontrolled type 2 diabetes mellitus with stage 3 chronic kidney disease (HCC)   Morbid obesity (HCC)   Hypothyroidism   Chronic diastolic heart failure (HCC)   Essential hypertension   AKI (acute kidney injury) ([place])   Hyponatremia   Anemia   AKI (acute kidney injury) ([place]) - SCr back in January was 1.6; it's 2.47 at admission, 2.54 today - on IVF 100c/hr - monitor I&O, daily weights - UA ok, UOP  ok     - resolved, SCr 1.47 this AM, back to baseline  Hyponatremia - Secondary to diuretic use and GI losses. - Continue time-limited and gentle NS infusion. - stable at 134; monitor     - resolved  Anemia, normocytic  - denies blood in stool, or any other frank blood; no evidence of acute bleed - iron panel ok - Monitor H&H; stable this AM  Uncontrolled type 2 diabetes mellitus with stage 3 chronic kidney disease ([place]) - Carbohydrate modified diet. - CBG monitoring with RI SS. - reports that she isn't always compliant with her medications - A1c 8.9  Hypothyroidism - TSH 0.819; continue home levothyroxine  Chronic diastolic heart failure (HCC) - No signs of decompensation. - Holding diuretics and antihypertensives. - Monitor fluid status closely; daily weights     - stop fluids, monitor I&O  Essential hypertension - Hold diuretics and ARB. - Monitor blood pressure, renal function electrolytes.  Morbid obesity ([place]) - Needs significant lifestyle modifications.  COVID 19+ - COVID swab positive - currently on RA w/o hypoxia or respiratory symptoms - Per chart review, she had symptoms back towards the end of June and reported to her PCP; the patient had exposure to someone who was positive at the time; the patient declined testing at the time - previous physician d/w with [facility], was told she needed to stay at [facility] d/c wound care not coming to [facility]  Abdominal wound/cellulitis - wound care eval pending; to be conducted through telemed - currently on rocephin, continue for now - looks better today; let's transition to keflex     - now on keflex, stable  Chronic back pain/leg pain/weakness/falls - c/o weakness leading to 2 falls POA - Hx of chronic back pain, w/ "buldging discs and arthritis" per pt -  BLE MSK strength is 5/5 and sensation intact b/l - PT  consult -Lumbar XR from [facility] w/ DDD - PT recs SNF; pt initially declined because she said she could not afford it; I told her that CM is working on options and that this would be best for her; she states she is ok with trying SNF now     - MR lumbar ordered for continued back pain; pending  Needs lumbar MR. MR down here and will need to go to [facility] for imaging. Let's get the results and see if neurosurg or ortho would need to be involved. Renal function returned to baseline. Continue keflex. Working on placement.   DVT prophylaxis:lovenox Code Status:FULL Family Communication:None at bedside Disposition Plan:TBD   Antimicrobials:  Keflex  ROS: C/o back pain. Denies CP, dyspnea, N, V, ab pain. Remainder 10-pt ROS is negative for all not previously mentioned.  Subjective: "It's hard to bend it."  Objective: Vitals:   05/27/19 0500 05/27/19 1241 05/27/19 2000 05/28/19 0700  BP: 106/64 125/67 128/78 117/84  Pulse: 72 74  78  Resp: 16 17 16 16   Temp: 98.9 F (37.2 C) 98.2 F (36.8 C) 98.1 F (36.7 C) 98.3 F (36.8 C)  TempSrc: Oral Oral  Oral  SpO2: 97% 98% 99% 98%  Weight:      Height:        Intake/Output Summary (Last 24 hours) at 05/28/2019 1033 Last data filed at 05/28/2019 0957 Gross per 24 hour  Intake -  Output 4060 ml  Net -4060 ml   Filed Weights   05/24/19 0348 05/25/19 0435  Weight: 116.2 kg 116.2 kg    Examination:  General:67 y.o.femaleresting in bed in NAD Eyes: PERRL, normal sclera ENMT: Nares patent w/o discharge, orophaynx clear, dentition normal, ears w/o discharge/lesions/ulcers Cardiovascular: RRR, +S1, S2, no m/g/r, equal pulses throughout Respiratory: CTABL, no w/r/r, normal WOB GI: BS+, NDNT, no masses noted, no organomegaly noted, abdominal bandaging CDI, erythema much improved MSK: No e/c/c Skin: No rashes, bruises, ulcerations noted Neuro: A&O x 3, msk str 5/5 BLE, lumbar pain with flexion of hip b/l, BLE sensation  intact Psyc: Appropriate interaction and affect, calm/cooperative   Data Reviewed: I have personally reviewed following labs and imaging studies.  CBC: Recent Labs  Lab 05/24/19 0838 05/25/19 0412 05/26/19 0429 05/28/19 0854  WBC 10.0 11.7* 11.1* 8.4  NEUTROABS 5.4  --  6.1 4.6  HGB 9.3* 9.1* 8.6* 8.3*  HCT 29.3* 29.5* 28.2* 26.9*  MCV 94.2 96.7 97.6 95.4  PLT 225 210 225 [id number]   Basic Metabolic Panel: Recent Labs  Lab 05/24/19 0838 05/25/19 0412 05/26/19 0429 05/28/19 0854  NA 134* 134* 131* 135  K 4.5 4.8 5.1 5.1  CL 103 105 101 105  CO2 24 21* 19* 20*  GLUCOSE 179* 147* 168* 147*  BUN 40* 40* 32* 29*  CREATININE 2.47* 2.54* 1.97* 1.47*  CALCIUM 8.4* 8.2* 8.2* 8.7*  MG  --   --  1.8 1.7  PHOS  --   --  4.0 4.0   GFR: Estimated Creatinine Clearance: 45.9 mL/min (A) (by C-G formula based on SCr of 1.47 mg/dL (H)). Liver Function Tests: Recent Labs  Lab 05/24/19 0838 05/25/19 0412 05/26/19 0429 05/28/19 0854  AST 16 16  --   --   ALT 12 11  --   --   ALKPHOS 44 42  --   --   BILITOT 0.7 0.3  --   --   PROT 6.7 6.3*  --   --  ALBUMIN 2.8* 2.5* 2.5* 2.3*   No results for input(s): LIPASE, AMYLASE in the last 168 hours. No results for input(s): AMMONIA in the last 168 hours. Coagulation Profile: No results for input(s): INR, PROTIME in the last 168 hours. Cardiac Enzymes: No results for input(s): CKTOTAL, CKMB, CKMBINDEX, TROPONINI in the last 168 hours. BNP (last 3 results) No results for input(s): PROBNP in the last 8760 hours. HbA1C: No results for input(s): HGBA1C in the last 72 hours. CBG: Recent Labs  Lab 05/27/19 1227 05/27/19 1738 05/27/19 2000 05/28/19 0019 05/28/19 0836  GLUCAP 124* 157* 153* 135* 147*   Lipid Profile: No results for input(s): CHOL, HDL, LDLCALC, TRIG, CHOLHDL, LDLDIRECT in the last 72 hours. Thyroid Function Tests: No results for input(s): TSH, T4TOTAL, FREET4, T3FREE, THYROIDAB in the last 72 hours. Anemia Panel:  Recent Labs    05/26/19 0429  FERRITIN 63   Sepsis Labs: No results for input(s): PROCALCITON, LATICACIDVEN in the last 168 hours.  Recent Results (from the past 240 hour(s))  SARS Coronavirus 2 [facility] order, Performed in [facility] hospital lab) Nasopharyngeal Nasopharyngeal Swab     Status: Abnormal   Collection Time: 05/24/19  4:50 AM   Specimen: Nasopharyngeal Swab  Result Value Ref Range Status   SARS Coronavirus 2 POSITIVE (A) NEGATIVE Final    Comment: RESULT CALLED TO, READ BACK BY AND VERIFIED WITH: JENNIFER HUFF, RN [id number] @ [id number] BY J SCOTTON (NOTE) If result is NEGATIVE SARS-CoV-2 target nucleic acids are NOT DETECTED. The SARS-CoV-2 RNA is generally detectable in upper and lower  respiratory specimens during the acute phase of infection. The lowest  concentration of SARS-CoV-2 viral copies this assay can detect is 250  copies / mL. A negative result does not preclude SARS-CoV-2 infection  and should not be used as the sole basis for treatment or other  patient management decisions.  A negative result may occur with  improper specimen collection / handling, submission of specimen other  than nasopharyngeal swab, presence of viral mutation(s) within the  areas targeted by this assay, and inadequate number of viral copies  (<250 copies / mL). A negative result must be combined with clinical  observations, patient history, and epidemiological information. If result is POSITIVE SARS-CoV-2 target nucleic acids are DET ECTED. The SARS-CoV-2 RNA is generally detectable in upper and lower  respiratory specimens during the acute phase of infection.  Positive  results are indicative of active infection with SARS-CoV-2.  Clinical  correlation with patient history and other diagnostic information is  necessary to determine patient infection status.  Positive results do  not rule out bacterial infection or co-infection with other viruses. If result is PRESUMPTIVE POSTIVE  SARS-CoV-2 nucleic acids MAY BE PRESENT.   A presumptive positive result was obtained on the submitted specimen  and confirmed on repeat testing.  While 2019 novel coronavirus  (SARS-CoV-2) nucleic acids may be present in the submitted sample  additional confirmatory testing may be necessary for epidemiological  and / or clinical management purposes  to differentiate between  SARS-CoV-2 and other Sarbecovirus currently known to infect humans.  If clinically indicated additional testing with an alternate test  methodology ([place]) is advised. The SARS-CoV-2 RNA is generally  detectable in upper and lower respiratory specimens during the acute  phase of infection. The expected result is Negative. Fact Sheet for Patients:  StrictlyIdeas.no Fact Sheet for Healthcare Providers: BankingDealers.co.za This test is not yet approved or cleared by the [place] FDA and has been  authorized for detection and/or diagnosis of SARS-CoV-2 by FDA under an Emergency Use Authorization (EUA).  This EUA will remain in effect (meaning this test can be used) for the duration of the COVID-19 declaration under Section 564(b)(1) of the Act, 21 U.S.C. section 360bbb-3(b)(1), unless the authorization is terminated or revoked sooner. Performed at [facility], [place] [address], [place], [place] [address]       Radiology Studies: No results found.   Scheduled Meds: . allopurinol  100 mg Oral Daily  . aspirin EC  81 mg Oral Daily  . buPROPion  200 mg Oral Daily  . cephALEXin  500 mg Oral Q12H  . docusate sodium  100 mg Oral BID  . DULoxetine  30 mg Oral BID  . enoxaparin (LOVENOX) injection  40 mg Subcutaneous Daily  . gabapentin  300 mg Oral TID  . insulin aspart  0-15 Units Subcutaneous TID WC  . insulin aspart protamine- aspart  8 Units Subcutaneous BID WC  . levothyroxine  88 mcg Oral QAC breakfast  . loratadine  10 mg Oral QHS   . LORazepam  0.5 mg Intravenous Once  . magnesium oxide  200 mg Oral Daily  . rosuvastatin  5 mg Oral Daily  . vitamin B-12  500 mcg Oral BID   Continuous Infusions: . sodium chloride 75 mL/hr at 05/27/19 1743  . methocarbamol (ROBAXIN) IV 500 mg (05/27/19 1536)     LOS: 4 days    Time spent: 25 minutes spent in the coordination of care today.   Jonnie Finner, DO [facility] Pager [phone]  If 7PM-7AM, please contact night-coverage [email or web address] Password [facility] 05/28/2019, 10:33 AM

## 2019-05-29 LAB — RENAL FUNCTION PANEL
Albumin: 2.3 g/dL — ABNORMAL LOW (ref 3.5–5.0)
Anion gap: 9 (ref 5–15)
BUN: 32 mg/dL — ABNORMAL HIGH (ref 8–23)
CO2: 23 mmol/L (ref 22–32)
Calcium: 8.9 mg/dL (ref 8.9–10.3)
Chloride: 105 mmol/L (ref 98–111)
Creatinine, Ser: 1.52 mg/dL — ABNORMAL HIGH (ref 0.44–1.00)
GFR calc Af Amer: 40 mL/min — ABNORMAL LOW (ref >60)
GFR calc non Af Amer: 35 mL/min — ABNORMAL LOW (ref >60)
Glucose, Bld: 130 mg/dL — ABNORMAL HIGH (ref 70–99)
Phosphorus: 4.7 mg/dL — ABNORMAL HIGH (ref 2.5–4.6)
Potassium: 4.9 mmol/L (ref 3.5–5.1)
Sodium: 137 mmol/L (ref 135–145)

## 2019-05-29 LAB — MAGNESIUM: Magnesium: 1.9 mg/dL (ref 1.7–2.4)

## 2019-05-29 LAB — CBC WITH DIFFERENTIAL/PLATELET
Abs Immature Granulocytes: 0.05 10*3/uL (ref 0.00–0.07)
Basophils Absolute: 0 10*3/uL (ref 0.0–0.1)
Basophils Relative: 0 %
Eosinophils Absolute: 0.4 10*3/uL (ref 0.0–0.5)
Eosinophils Relative: 4 %
HCT: 27 % — ABNORMAL LOW (ref 36.0–46.0)
Hemoglobin: 8.4 g/dL — ABNORMAL LOW (ref 12.0–15.0)
Immature Granulocytes: 1 %
Lymphocytes Relative: 41 %
Lymphs Abs: 3.9 10*3/uL (ref 0.7–4.0)
MCH: 29.9 pg (ref 26.0–34.0)
MCHC: 31.1 g/dL (ref 30.0–36.0)
MCV: 96.1 fL (ref 80.0–100.0)
Monocytes Absolute: 0.9 10*3/uL (ref 0.1–1.0)
Monocytes Relative: 9 %
Neutro Abs: 4.3 10*3/uL (ref 1.7–7.7)
Neutrophils Relative %: 45 %
Platelets: 271 10*3/uL (ref 150–400)
RBC: 2.81 MIL/uL — ABNORMAL LOW (ref 3.87–5.11)
RDW: 15.2 % (ref 11.5–15.5)
WBC: 9.5 10*3/uL (ref 4.0–10.5)
nRBC: 0 % (ref 0.0–0.2)

## 2019-05-29 LAB — GLUCOSE, CAPILLARY
Glucose-Capillary: 128 mg/dL — ABNORMAL HIGH (ref 70–99)
Glucose-Capillary: 182 mg/dL — ABNORMAL HIGH (ref 70–99)
Glucose-Capillary: 166 mg/dL — ABNORMAL HIGH (ref 70–99)
Glucose-Capillary: 136 mg/dL — ABNORMAL HIGH (ref 70–99)

## 2019-05-29 MED ORDER — OXYCODONE HCL 5 MG PO TABS
10.0000 mg | ORAL_TABLET | Freq: Four times a day (QID) | ORAL | Status: DC | PRN
  Administered 2019-05-29 (×2): 10 mg via ORAL
  Administered 2019-05-30: 5 mg via ORAL
  Administered 2019-05-30 – 2019-06-04 (×10): 10 mg via ORAL
  Filled 2019-05-29 (×13): qty 2

## 2019-05-29 NOTE — Progress Notes (Signed)
Marland Kitchen  PROGRESS NOTE    Alice Kemp  [id number] DOB: March 16, 1951 DOA: 05/24/2019 PCP: [name], MD   Brief Narrative:   Alice Kemp is a 68 y.o. female with medical history significant of anxiety, depression, osteoarthritis, chronic back pain, benign colon neoplasm, history of left breast abscess, cervical radiculopathy, chronic kidney disease, constipation, type 2 diabetes, chronic diastolic heart failure, glaucoma, history of gout, hyperlipidemia, hypothyroidism, morbid obesity, obstructive sleep apnea, pericardial effusion, pulmonary hypertension, vitamin B12 and vitamin D deficiency who is transferred from [facility] emergency department to this facility after presenting there with progressively worse lower back pain with worsening lower extremity weakness, generalized weakness for several days, multiple episodes of emesis yesterday, decreased oral intake, worsening right-sided lower abdomen wounds.initially began after the patient scratched the area following having local pruritus. She denies fever, but complains of chills, nausea and emesis. She has been lightheaded. No sore throat, rhinorrhea, wheezing, hemoptysis, dyspnea, chest pain, palpitations, diaphoresis, orthopnea or recent pitting edema of the lower extremities. Denies diarrhea, melena or hematochezia. No dysuria, frequency or hematuria. Denies polyuria, polydipsia, polyphagia or blurred vision.   Assessment & Plan:   Principal Problem:   Cellulitis of abdominal wall Active Problems:   Uncontrolled type 2 diabetes mellitus with stage 3 chronic kidney disease (HCC)   Morbid obesity (HCC)   Hypothyroidism   Chronic diastolic heart failure (HCC)   Essential hypertension   AKI (acute kidney injury) ([place])   Hyponatremia   Anemia   AKI (acute kidney injury) ([place]) - SCr back in January was 1.6; it's 2.47 at admission, 2.54 today - on IVF 100c/hr - monitor I&O, daily weights - UA ok, UOP  ok     - resolved, SCr 1.52 this AM, back to baseline  Hyponatremia - Secondary to diuretic use and GI losses. - Continue time-limited and gentle NS infusion. - stable at 134; monitor     - resolved  Anemia, normocytic  - denies blood in stool, or any other frank blood; no evidence of acute bleed - iron panel ok - Monitor H&H; stable this AM  Uncontrolled type 2 diabetes mellitus with stage 3 chronic kidney disease ([place]) - Carbohydrate modified diet. - CBG monitoring with RI SS. - reports that she isn't always compliant with her medications - A1c 8.9     - glucose acceptable  Hypothyroidism - TSH 0.819; continue home levothyroxine  Chronic diastolic heart failure (HCC) - No signs of decompensation. - Holding diuretics and antihypertensives. - Monitor fluid status closely; daily weights     - stop fluids, monitor I&O  Essential hypertension - Hold diuretics and ARB. - Monitor blood pressure, renal function electrolytes.  Morbid obesity ([place]) - Needs significant lifestyle modifications.  COVID 19+ - COVID swab positive - currently on RA w/o hypoxia or respiratory symptoms - Per chart review, she had symptoms back towards the end of June and reported to her PCP; the patient had exposure to someone who was positive at the time; the patient declined testing at the time - previous physician d/w with [facility], was told she needed to stay at [facility] d/c wound care not coming to [facility]     - no active symptoms  Abdominal wound/cellulitis - wound care eval pending; to be conducted through telemed - currently on rocephin, continue for now - looks better today; let's transition to keflex - now on keflex, stable; end 06/02/2019  Chronic back pain/leg pain/weakness/falls - c/o weakness leading to 2 falls POA -  Hx of chronic back pain, w/ "buldging discs and arthritis" per pt  - BLE MSK strength is 5/5 and sensation intact b/l - PT consult -Lumbar XR from [facility] w/ DDD - PT recs SNF; pt initially declined because she said she could not afford it; I told her that CM is working on options and that this would be best for her; she states she is ok with trying SNF now - MR lumbar ordered for continued back pain; pending     - MR results noted; neurosurgery consulted     - requesting Xfer to [facility]; neurosurgery to try to get her on schedule tomorrow  DVT prophylaxis:lovenox Code Status:FULL Family Communication:None at bedside Disposition Plan:TBD   Antimicrobials:  Keflex  ROS: C/o back pain, left leg pain/weakness. Denies CP, dyspnea, N, V, ab pain. Remainder 10-pt ROS is negative for all not previously mentioned.   Subjective: "I can't afford any of this."  Objective: Vitals:   05/28/19 1325 05/28/19 1733 05/28/19 2057 05/29/19 0606  BP: 120/61 116/65 121/64 128/68  Pulse: 74 80 73 77  Resp: 17 17 20 18   Temp: 98 F (36.7 C) 98.2 F (36.8 C) 97.7 F (36.5 C) 97.7 F (36.5 C)  TempSrc: Oral Oral Oral Oral  SpO2: 97% 97% 97% 94%  Weight:    117.3 kg  Height:        Intake/Output Summary (Last 24 hours) at 05/29/2019 1019 Last data filed at 05/29/2019 0916 Gross per 24 hour  Intake -  Output 2150 ml  Net -2150 ml   Filed Weights   05/24/19 0348 05/25/19 0435 05/29/19 0606  Weight: 116.2 kg 116.2 kg 117.3 kg    Examination:  General:67 y.o.femaleresting in bed in NAD Eyes: PERRL, normal sclera ENMT: Nares patent w/o discharge, orophaynx clear, dentition normal, ears w/o discharge/lesions/ulcers Cardiovascular: RRR, +S1, S2, no m/g/r, equal pulses throughout Respiratory: CTABL, no w/r/r, normal WOB GI: BS+, NDNT, no masses noted, no organomegaly noted, abdominal bandaging CDI, erythema is minimal, wound is improved MSK: No e/c/c Skin: No rashes, bruises, ulcerations noted Neuro: A&O x 3, msk str 4-5/5 BLE,  lumbar pain with flexion of hip b/l, BLE sensation intact Psyc: Appropriate interaction and affect, calm/cooperative   Data Reviewed: I have personally reviewed following labs and imaging studies.  CBC: Recent Labs  Lab 05/24/19 0838 05/25/19 0412 05/26/19 0429 05/28/19 0854 05/29/19 0546  WBC 10.0 11.7* 11.1* 8.4 9.5  NEUTROABS 5.4  --  6.1 4.6 4.3  HGB 9.3* 9.1* 8.6* 8.3* 8.4*  HCT 29.3* 29.5* 28.2* 26.9* 27.0*  MCV 94.2 96.7 97.6 95.4 96.1  PLT 225 210 225 244 [id number]   Basic Metabolic Panel: Recent Labs  Lab 05/24/19 0838 05/25/19 0412 05/26/19 0429 05/28/19 0854 05/29/19 0546  NA 134* 134* 131* 135 137  K 4.5 4.8 5.1 5.1 4.9  CL 103 105 101 105 105  CO2 24 21* 19* 20* 23  GLUCOSE 179* 147* 168* 147* 130*  BUN 40* 40* 32* 29* 32*  CREATININE 2.47* 2.54* 1.97* 1.47* 1.52*  CALCIUM 8.4* 8.2* 8.2* 8.7* 8.9  MG  --   --  1.8 1.7 1.9  PHOS  --   --  4.0 4.0 4.7*   GFR: Estimated Creatinine Clearance: 44.6 mL/min (A) (by C-G formula based on SCr of 1.52 mg/dL (H)). Liver Function Tests: Recent Labs  Lab 05/24/19 [id number] 05/25/19 0412 05/26/19 0429 05/28/19 0854 05/29/19 0546  AST 16 16  --   --   --  ALT 12 11  --   --   --   ALKPHOS 44 42  --   --   --   BILITOT 0.7 0.3  --   --   --   PROT 6.7 6.3*  --   --   --   ALBUMIN 2.8* 2.5* 2.5* 2.3* 2.3*   No results for input(s): LIPASE, AMYLASE in the last 168 hours. No results for input(s): AMMONIA in the last 168 hours. Coagulation Profile: No results for input(s): INR, PROTIME in the last 168 hours. Cardiac Enzymes: No results for input(s): CKTOTAL, CKMB, CKMBINDEX, TROPONINI in the last 168 hours. BNP (last 3 results) No results for input(s): PROBNP in the last 8760 hours. HbA1C: No results for input(s): HGBA1C in the last 72 hours. CBG: Recent Labs  Lab 05/28/19 0836 05/28/19 1219 05/28/19 1729 05/28/19 2137 05/29/19 0840  GLUCAP 147* 196* 158* 146* 128*   Lipid Profile: No results for input(s):  CHOL, HDL, LDLCALC, TRIG, CHOLHDL, LDLDIRECT in the last 72 hours. Thyroid Function Tests: No results for input(s): TSH, T4TOTAL, FREET4, T3FREE, THYROIDAB in the last 72 hours. Anemia Panel: No results for input(s): VITAMINB12, FOLATE, FERRITIN, TIBC, IRON, RETICCTPCT in the last 72 hours. Sepsis Labs: No results for input(s): PROCALCITON, LATICACIDVEN in the last 168 hours.  Recent Results (from the past 240 hour(s))  SARS Coronavirus 2 [facility] order, Performed in [facility] hospital lab) Nasopharyngeal Nasopharyngeal Swab     Status: Abnormal   Collection Time: 05/24/19  4:50 AM   Specimen: Nasopharyngeal Swab  Result Value Ref Range Status   SARS Coronavirus 2 POSITIVE (A) NEGATIVE Final    Comment: RESULT CALLED TO, READ BACK BY AND VERIFIED WITH: JENNIFER HUFF, RN [id number] @ [id number] BY J SCOTTON (NOTE) If result is NEGATIVE SARS-CoV-2 target nucleic acids are NOT DETECTED. The SARS-CoV-2 RNA is generally detectable in upper and lower  respiratory specimens during the acute phase of infection. The lowest  concentration of SARS-CoV-2 viral copies this assay can detect is 250  copies / mL. A negative result does not preclude SARS-CoV-2 infection  and should not be used as the sole basis for treatment or other  patient management decisions.  A negative result may occur with  improper specimen collection / handling, submission of specimen other  than nasopharyngeal swab, presence of viral mutation(s) within the  areas targeted by this assay, and inadequate number of viral copies  (<250 copies / mL). A negative result must be combined with clinical  observations, patient history, and epidemiological information. If result is POSITIVE SARS-CoV-2 target nucleic acids are DET ECTED. The SARS-CoV-2 RNA is generally detectable in upper and lower  respiratory specimens during the acute phase of infection.  Positive  results are indicative of active infection with SARS-CoV-2.  Clinical   correlation with patient history and other diagnostic information is  necessary to determine patient infection status.  Positive results do  not rule out bacterial infection or co-infection with other viruses. If result is PRESUMPTIVE POSTIVE SARS-CoV-2 nucleic acids MAY BE PRESENT.   A presumptive positive result was obtained on the submitted specimen  and confirmed on repeat testing.  While 2019 novel coronavirus  (SARS-CoV-2) nucleic acids may be present in the submitted sample  additional confirmatory testing may be necessary for epidemiological  and / or clinical management purposes  to differentiate between  SARS-CoV-2 and other Sarbecovirus currently known to infect humans.  If clinically indicated additional testing with an alternate test  methodology (LA  [id number]) is advised. The SARS-CoV-2 RNA is generally  detectable in upper and lower respiratory specimens during the acute  phase of infection. The expected result is Negative. Fact Sheet for Patients:  StrictlyIdeas.no Fact Sheet for Healthcare Providers: BankingDealers.co.za This test is not yet approved or cleared by the [place] FDA and has been authorized for detection and/or diagnosis of SARS-CoV-2 by FDA under an Emergency Use Authorization (EUA).  This EUA will remain in effect (meaning this test can be used) for the duration of the COVID-19 declaration under Section 564(b)(1) of the Act, 21 U.S.C. section 360bbb-3(b)(1), unless the authorization is terminated or revoked sooner. Performed at [facility], [place] [address] [place], [place] [address]       Radiology Studies: Mr Lumbar Spine Wo Contrast  Result Date: 05/28/2019 CLINICAL DATA:  Bilateral lower extremity weakness with back pain EXAM: MRI LUMBAR SPINE WITHOUT CONTRAST TECHNIQUE: Multiplanar, multisequence MR imaging of the lumbar spine was performed. No intravenous contrast was  administered. COMPARISON:  Lumbar radiography 05/23/2019 FINDINGS: Segmentation:  5 lumbar type vertebrae Alignment:  L4-5 grade 1 anterolisthesis. Vertebrae: No fracture, evidence of discitis, or bone lesion. Discogenic endplate edema at [id number]. Conus medullaris and cauda equina: Conus extends to the L1 level. Conus and cauda equina appear normal. Paraspinal and other soft tissues: No acute finding Disc levels: T12- L1: Advanced disc narrowing with right eccentric bulging. Degenerative cyst along the right ligamentum flavum without apparent cord impingement. The foramina remain patent. L1-L2: Unremarkable. L2-L3: Disc narrowing and bulging. There is amorphous material within the spinal canal at L2-3 to the mid L3 body. Posterior element hypertrophy with patent spinal canal. L3-L4: Disc narrowing and circumferential bulging with annular fissure. Facet hypertrophy. Advanced spinal stenosis. Patent foramina L4-L5: Advanced facet arthropathy with spurring and anterolisthesis. The disc is narrowed and bulging, greater towards the right where there is a herniation. High-grade spinal stenosis and right foraminal impingement. L5-S1:Facet spurring with left-sided joint effusion. Mild disc narrowing and bulging with annular fissure. No impingement Motion degraded, especially axial images. IMPRESSION: 1. 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 Postcontrast imaging may be contributory. 2. L3-4 and L4-5 advanced degenerative spinal stenosis. 3. L4-5 right foraminal impingement. 4. Moderate motion degradation. Electronically Signed   By: Monte Fantasia M.D.   On: 05/28/2019 22:28     Scheduled Meds: . allopurinol  100 mg Oral Daily  . aspirin EC  81 mg Oral Daily  . buPROPion  200 mg Oral Daily  . cephALEXin  500 mg Oral Q12H  . docusate sodium  100 mg Oral BID  . DULoxetine  30 mg Oral BID  . gabapentin  300 mg Oral  TID  . insulin aspart  0-15 Units Subcutaneous TID WC  . insulin aspart protamine- aspart  8 Units Subcutaneous BID WC  . levothyroxine  88 mcg Oral QAC breakfast  . loratadine  10 mg Oral QHS  . magnesium oxide  200 mg Oral Daily  . rosuvastatin  5 mg Oral Daily  . vitamin B-12  500 mcg Oral BID   Continuous Infusions: . methocarbamol (ROBAXIN) IV 500 mg (05/28/19 1331)     LOS: 5 days    Time spent: 35 minutes spent in the coordination of care today.   Jonnie Finner, DO [facility] Pager [phone]  If 7PM-7AM, please contact night-coverage [email or web address] Password [facility] 05/29/2019, 10:19 AM

## 2019-05-29 NOTE — Progress Notes (Signed)
Report called to [place], [place] notified this RN that they will be in route shortly.

## 2019-05-29 NOTE — Progress Notes (Signed)
Patient is off the floor via [facility].

## 2019-05-29 NOTE — Consult Note (Signed)
Reason for Consult: Back pain leg weakness Referring Physician: Bonne Dolores hospitalist  Alice Kemp is an 68 y.o. female.  HPI: 68 year old female with multiple medical problems to include diabetes hypertension hypothyroidism kidney disease morbid obesity has had back pain and some lower extremity weakness has been progressing for the last several weeks however over the last week she had 2 falls and has been nonambulatory.  She reports her legs gave way on her last weekend and since then she has not been able to walk she does not think her weakness is any worse today than it was a week ago but is not any better.  She denies any difficulty with her bladder she does report some constipation with her bowels.  She was admitted to [facility] she did test positive for COVID and she had a cellulitis or abdominal wall that they have treated.  They feel the COVID positive test is related to an infection back in June and that she has run the course and may no longer be infectious although still taking precautions.  Past Medical History:  Diagnosis Date  . Anxiety   . Arthritis   . Back pain   . Benign neoplasm of colon 06/29/2012  . Breast abscess 07/2011   left; s/p I&D + abx by gen surg  . Cervical radiculopathy at C6   . CKD (chronic kidney disease) stage 3, GFR 30-59 ml/min (HCC)    follows with renal  . Constipation   . Depression   . Diabetes mellitus type II   . Diastolic heart failure ([place]) 09/2013 echo  . Gall bladder inflammation   . Glaucoma   . Gout   . Hyperlipidemia   . Hypothyroidism   . Joint pain   . Kidney disease   . Leg pain   . Morbid obesity ([place])   . OBSTRUCTIVE SLEEP APNEA 12/23/2010   npsg 2012:  AHI 25/hr, severe desat to 68%.   autoset 2013: optimal pressure 13cm   . PERICARDIAL EFFUSION 12/12/2010  . Pulmonary hypertension ([place])   . Shortness of breath   . Skin lesion of breast 07/09/2012  . Swelling of extremity   . Unspecified hypothyroidism   . Vitamin B12 deficiency    . Vitamin D deficiency     Past Surgical History:  Procedure Laterality Date  . BREAST BIOPSY  1990   Left  . CATARACT EXTRACTION    . COLONOSCOPY  06/29/2012   Procedure: COLONOSCOPY;  Surgeon: Ladene Artist, MD,FACG;  Location: [facility];  Service: Endoscopy;  Laterality: N/A;  . SHOULDER SURGERY  2010   Left  . TONSILLECTOMY  1964    Family History  Problem Relation Age of Onset  . Hypertension Mother   . Heart disease Mother   . Heart attack Mother   . Thyroid disease Mother   . Diabetes Father   . Heart disease Father   . Skin cancer Father   . Cancer Father        skin  . Heart attack Father   . Hyperlipidemia Father   . Colon polyps Father   . Depression Father   . Hypertension Brother   . Skin cancer Paternal Grandfather   . Colon cancer Paternal Grandfather 38  . Breast cancer Paternal Aunt   . Cancer Paternal Aunt        breast  . Diabetes Paternal Uncle   . Diabetes Maternal Grandfather   . Sudden death Neg Hx     Social History:  reports that she has never smoked. She has never used smokeless tobacco. She reports that she does not drink alcohol or use drugs.  Allergies:  Allergies  Allergen Reactions  . Morphine And Related Other (See Comments)    Behavioral Issues  . Sulfa Antibiotics   . Penicillins Rash    Occurred as infant    Medications: I have reviewed the patient's current medications.  Results for orders placed or performed during the hospital encounter of 05/24/19 (from the past 48 hour(s))  Glucose, capillary     Status: Abnormal   Collection Time: 05/27/19 12:27 PM  Result Value Ref Range   Glucose-Capillary 124 (H) 70 - 99 mg/dL  Glucose, capillary     Status: Abnormal   Collection Time: 05/27/19  5:38 PM  Result Value Ref Range   Glucose-Capillary 157 (H) 70 - 99 mg/dL  Glucose, capillary     Status: Abnormal   Collection Time: 05/27/19  8:00 PM  Result Value Ref Range   Glucose-Capillary 153 (H) 70 - 99 mg/dL   Glucose, capillary     Status: Abnormal   Collection Time: 05/28/19 12:19 AM  Result Value Ref Range   Glucose-Capillary 135 (H) 70 - 99 mg/dL  Glucose, capillary     Status: Abnormal   Collection Time: 05/28/19  8:36 AM  Result Value Ref Range   Glucose-Capillary 147 (H) 70 - 99 mg/dL  Renal function panel     Status: Abnormal   Collection Time: 05/28/19  8:54 AM  Result Value Ref Range   Sodium 135 135 - 145 mmol/L   Potassium 5.1 3.5 - 5.1 mmol/L   Chloride 105 98 - 111 mmol/L   CO2 20 (L) 22 - 32 mmol/L   Glucose, Bld 147 (H) 70 - 99 mg/dL   BUN 29 (H) 8 - 23 mg/dL   Creatinine, Ser 1.47 (H) 0.44 - 1.00 mg/dL   Calcium 8.7 (L) 8.9 - 10.3 mg/dL   Phosphorus 4.0 2.5 - 4.6 mg/dL   Albumin 2.3 (L) 3.5 - 5.0 g/dL   GFR calc non Af Amer 36 (L) >60 mL/min   GFR calc Af Amer 42 (L) >60 mL/min   Anion gap 10 5 - 15    Comment: Performed at [facility], [place] [address] [place], [place] [address]  Magnesium     Status: None   Collection Time: 05/28/19  8:54 AM  Result Value Ref Range   Magnesium 1.7 1.7 - 2.4 mg/dL    Comment: Performed at [facility], [place] [address] [place], [place] [address]  CBC with Differential/Platelet     Status: Abnormal   Collection Time: 05/28/19  8:54 AM  Result Value Ref Range   WBC 8.4 4.0 - 10.5 K/uL   RBC 2.82 (L) 3.87 - 5.11 MIL/uL   Hemoglobin 8.3 (L) 12.0 - 15.0 g/dL   HCT 26.9 (L) 36.0 - 46.0 %   MCV 95.4 80.0 - 100.0 fL   MCH 29.4 26.0 - 34.0 pg   MCHC 30.9 30.0 - 36.0 g/dL   RDW 15.1 11.5 - 15.5 %   Platelets 244 150 - 400 K/uL   nRBC 0.0 0.0 - 0.2 %   Neutrophils Relative % 55 %   Neutro Abs 4.6 1.7 - 7.7 K/uL   Lymphocytes Relative 32 %   Lymphs Abs 2.7 0.7 - 4.0 K/uL   Monocytes Relative 8 %   Monocytes Absolute 0.7 0.1 - 1.0 K/uL   Eosinophils Relative  4 %   Eosinophils Absolute 0.4 0.0 - 0.5 K/uL   Basophils Relative 0 %   Basophils Absolute 0.0 0.0 - 0.1 K/uL   Immature Granulocytes 1  %   Abs Immature Granulocytes 0.10 (H) 0.00 - 0.07 K/uL    Comment: Performed at [facility], [place] [address] [place], [place] [address]  D-dimer, quantitative (not at [facility])     Status: Abnormal   Collection Time: 05/28/19  8:54 AM  Result Value Ref Range   D-Dimer, Quant 2.47 (H) 0.00 - 0.50 ug/mL-FEU    Comment: (NOTE) At the manufacturer cut-off of 0.50 ug/mL FEU, this assay has been documented to exclude PE with a sensitivity and negative predictive value of 97 to 99%.  At this time, this assay has not been approved by the FDA to exclude DVT/VTE. Results should be correlated with clinical presentation. Performed at [facility], [place] [address] [place], [place] [address]   C-reactive protein     Status: Abnormal   Collection Time: 05/28/19  8:54 AM  Result Value Ref Range   CRP 14.9 (H) <1.0 mg/dL    Comment: Performed at [facility], [place] [address] [place], [place] [address]  Glucose, capillary     Status: Abnormal   Collection Time: 05/28/19 12:19 PM  Result Value Ref Range   Glucose-Capillary 196 (H) 70 - 99 mg/dL  Glucose, capillary     Status: Abnormal   Collection Time: 05/28/19  5:29 PM  Result Value Ref Range   Glucose-Capillary 158 (H) 70 - 99 mg/dL  Glucose, capillary     Status: Abnormal   Collection Time: 05/28/19  9:37 PM  Result Value Ref Range   Glucose-Capillary 146 (H) 70 - 99 mg/dL  CBC with Differential/Platelet     Status: Abnormal   Collection Time: 05/29/19  5:46 AM  Result Value Ref Range   WBC 9.5 4.0 - 10.5 K/uL   RBC 2.81 (L) 3.87 - 5.11 MIL/uL   Hemoglobin 8.4 (L) 12.0 - 15.0 g/dL   HCT 27.0 (L) 36.0 - 46.0 %   MCV 96.1 80.0 - 100.0 fL   MCH 29.9 26.0 - 34.0 pg   MCHC 31.1 30.0 - 36.0 g/dL   RDW 15.2 11.5 - 15.5 %   Platelets 271 150 - 400 K/uL   nRBC 0.0 0.0 - 0.2 %   Neutrophils Relative % 45 %   Neutro Abs 4.3 1.7 - 7.7 K/uL   Lymphocytes Relative 41 %   Lymphs Abs 3.9 0.7 -  4.0 K/uL   Monocytes Relative 9 %   Monocytes Absolute 0.9 0.1 - 1.0 K/uL   Eosinophils Relative 4 %   Eosinophils Absolute 0.4 0.0 - 0.5 K/uL   Basophils Relative 0 %   Basophils Absolute 0.0 0.0 - 0.1 K/uL   Immature Granulocytes 1 %   Abs Immature Granulocytes 0.05 0.00 - 0.07 K/uL    Comment: Performed at [facility], [place] [address], [place], [place] [address]  Magnesium     Status: None   Collection Time: 05/29/19  5:46 AM  Result Value Ref Range   Magnesium 1.9 1.7 - 2.4 mg/dL    Comment: Performed at [facility], [place] [address], [place], [place] [address]  Renal function panel     Status: Abnormal   Collection Time: 05/29/19  5:46 AM  Result Value Ref Range   Sodium 137 135 - 145 mmol/L   Potassium 4.9  3.5 - 5.1 mmol/L   Chloride 105 98 - 111 mmol/L   CO2 23 22 - 32 mmol/L   Glucose, Bld 130 (H) 70 - 99 mg/dL   BUN 32 (H) 8 - 23 mg/dL   Creatinine, Ser 1.52 (H) 0.44 - 1.00 mg/dL   Calcium 8.9 8.9 - 10.3 mg/dL   Phosphorus 4.7 (H) 2.5 - 4.6 mg/dL   Albumin 2.3 (L) 3.5 - 5.0 g/dL   GFR calc non Af Amer 35 (L) >60 mL/min   GFR calc Af Amer 40 (L) >60 mL/min   Anion gap 9 5 - 15    Comment: Performed at [facility], [place] [address], [place], [place] [address]  Glucose, capillary     Status: Abnormal   Collection Time: 05/29/19  8:40 AM  Result Value Ref Range   Glucose-Capillary 128 (H) 70 - 99 mg/dL    Mr Lumbar Spine Wo Contrast  Result Date: 05/28/2019 CLINICAL DATA:  Bilateral lower extremity weakness with back pain EXAM: MRI LUMBAR SPINE WITHOUT CONTRAST TECHNIQUE: Multiplanar, multisequence MR imaging of the lumbar spine was performed. No intravenous contrast was administered. COMPARISON:  Lumbar radiography 05/23/2019 FINDINGS: Segmentation:  5 lumbar type vertebrae Alignment:  L4-5 grade 1 anterolisthesis. Vertebrae: No fracture, evidence of discitis, or bone lesion. Discogenic endplate edema at [id number]. Conus  medullaris and cauda equina: Conus extends to the L1 level. Conus and cauda equina appear normal. Paraspinal and other soft tissues: No acute finding Disc levels: T12- L1: Advanced disc narrowing with right eccentric bulging. Degenerative cyst along the right ligamentum flavum without apparent cord impingement. The foramina remain patent. L1-L2: Unremarkable. L2-L3: Disc narrowing and bulging. There is amorphous material within the spinal canal at L2-3 to the mid L3 body. Posterior element hypertrophy with patent spinal canal. L3-L4: Disc narrowing and circumferential bulging with annular fissure. Facet hypertrophy. Advanced spinal stenosis. Patent foramina L4-L5: Advanced facet arthropathy with spurring and anterolisthesis. The disc is narrowed and bulging, greater towards the right where there is a herniation. High-grade spinal stenosis and right foraminal impingement. L5-S1:Facet spurring with left-sided joint effusion. Mild disc narrowing and bulging with annular fissure. No impingement Motion degraded, especially axial images. IMPRESSION: 1. 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 Postcontrast imaging may be contributory. 2. L3-4 and L4-5 advanced degenerative spinal stenosis. 3. L4-5 right foraminal impingement. 4. Moderate motion degradation. Electronically Signed   By: Monte Fantasia M.D.   On: 05/28/2019 22:28    Review of Systems  Musculoskeletal: Positive for back pain.  Neurological: Positive for tingling, sensory change and focal weakness.   Blood pressure 128/68, pulse 77, temperature 97.7 F (36.5 C), temperature source Oral, resp. rate 18, height 5\' 4"  (1.626 m), weight 117.3 kg, SpO2 94 %. Physical Exam  Constitutional: She is oriented to person, place, and time.  Neurological: She is alert and oriented to person, place, and time. She has normal strength.  Bilateral lower extremity  weakness worse on the left iliopsoas on the left is 4 out of 5 quads are 4 out of 5 dorsiflexion is 4- out of 5 plantarflexion 5 out of 5 right lower extremity has 4+ out of 5 iliopsoas 4+5 quadricep dorsiflexion at 4 out of 5    Assessment/Plan: 68 year old female with back pain lower extremity weakness worse on the left MRI scan is very difficult to interpret although looks like a very large disc herniation at  L3-4 causing severe thecal sac compression.  Due to patient's significant lower extremity weakness and her failure of conservative treatment I recommended lumbar microdiscectomy.  We will plan on transferring the patient from [place] over to [facility] today stabilizing her overnight making her n.p.o. and plan discectomy tomorrow afternoon.  Hussam Muniz P 05/29/2019, 9:26 AM

## 2019-05-30 ENCOUNTER — Encounter (HOSPITAL_COMMUNITY)
Admission: AD | Disposition: A | Discharge status: Skilled Nursing Facility | Payer: Self-pay | Source: Other Acute Inpatient Hospital | Attending: Internal Medicine

## 2019-05-30 ENCOUNTER — Inpatient Hospital Stay (HOSPITAL_COMMUNITY): Payer: PPO | Admitting: Certified Registered"

## 2019-05-30 ENCOUNTER — Encounter (HOSPITAL_COMMUNITY): Payer: Self-pay | Admitting: Certified Registered"

## 2019-05-30 ENCOUNTER — Inpatient Hospital Stay (HOSPITAL_COMMUNITY): Payer: PPO

## 2019-05-30 DIAGNOSIS — M5126 Other intervertebral disc displacement, lumbar region: Secondary | ICD-10-CM | POA: Diagnosis present

## 2019-05-30 DIAGNOSIS — E871 Hypo-osmolality and hyponatremia: Secondary | ICD-10-CM

## 2019-05-30 DIAGNOSIS — E1122 Type 2 diabetes mellitus with diabetic chronic kidney disease: Secondary | ICD-10-CM

## 2019-05-30 DIAGNOSIS — E1165 Type 2 diabetes mellitus with hyperglycemia: Secondary | ICD-10-CM

## 2019-05-30 DIAGNOSIS — I10 Essential (primary) hypertension: Secondary | ICD-10-CM

## 2019-05-30 DIAGNOSIS — E039 Hypothyroidism, unspecified: Secondary | ICD-10-CM

## 2019-05-30 DIAGNOSIS — N179 Acute kidney failure, unspecified: Secondary | ICD-10-CM

## 2019-05-30 DIAGNOSIS — N183 Chronic kidney disease, stage 3 (moderate): Secondary | ICD-10-CM

## 2019-05-30 DIAGNOSIS — D649 Anemia, unspecified: Secondary | ICD-10-CM

## 2019-05-30 HISTORY — PX: LUMBAR LAMINECTOMY/DECOMPRESSION MICRODISCECTOMY: SHX5026

## 2019-05-30 LAB — GLUCOSE, CAPILLARY
Glucose-Capillary: 141 mg/dL — ABNORMAL HIGH (ref 70–99)
Glucose-Capillary: 136 mg/dL — ABNORMAL HIGH (ref 70–99)
Glucose-Capillary: 174 mg/dL — ABNORMAL HIGH (ref 70–99)

## 2019-05-30 SURGERY — LUMBAR LAMINECTOMY/DECOMPRESSION MICRODISCECTOMY 1 LEVEL
Anesthesia: General | Site: Back | Laterality: Left

## 2019-05-30 MED ORDER — SODIUM CHLORIDE 0.9 % IV SOLN
250.0000 mL | INTRAVENOUS | Status: DC
  Administered 2019-05-30: 250 mL via INTRAVENOUS

## 2019-05-30 MED ORDER — ONDANSETRON HCL 4 MG/2ML IJ SOLN: 4.0000 mg | Freq: Four times a day (QID) | INTRAMUSCULAR | Status: DC | PRN

## 2019-05-30 MED ORDER — BUPIVACAINE HCL (PF) 0.25 % IJ SOLN
INTRAMUSCULAR | Status: AC
  Filled 2019-05-30: qty 30

## 2019-05-30 MED ORDER — ACETAMINOPHEN 10 MG/ML IV SOLN
INTRAVENOUS | Status: AC
  Filled 2019-05-30: qty 100

## 2019-05-30 MED ORDER — SUCCINYLCHOLINE CHLORIDE 200 MG/10ML IV SOSY
PREFILLED_SYRINGE | INTRAVENOUS | Status: DC | PRN
  Administered 2019-05-30: 140 mg via INTRAVENOUS

## 2019-05-30 MED ORDER — HEMOSTATIC AGENTS (NO CHARGE) OPTIME
TOPICAL | Status: DC | PRN
  Administered 2019-05-30: 1 via TOPICAL

## 2019-05-30 MED ORDER — LIDOCAINE 2% (20 MG/ML) 5 ML SYRINGE
INTRAMUSCULAR | Status: DC | PRN
  Administered 2019-05-30: 60 mg via INTRAVENOUS

## 2019-05-30 MED ORDER — HYDROMORPHONE HCL 1 MG/ML IJ SOLN
0.5000 mg | INTRAMUSCULAR | Status: DC | PRN
  Administered 2019-05-31: 0.5 mg via INTRAVENOUS
  Filled 2019-05-30: qty 1

## 2019-05-30 MED ORDER — ACETAMINOPHEN 325 MG PO TABS
650.0000 mg | ORAL_TABLET | ORAL | Status: DC | PRN
  Administered 2019-06-01 – 2019-06-02 (×2): 650 mg via ORAL
  Filled 2019-05-30 (×2): qty 2

## 2019-05-30 MED ORDER — FENTANYL CITRATE (PF) 100 MCG/2ML IJ SOLN
INTRAMUSCULAR | Status: DC | PRN
  Administered 2019-05-30 (×3): 50 ug via INTRAVENOUS
  Administered 2019-05-30: 100 ug via INTRAVENOUS

## 2019-05-30 MED ORDER — 0.9 % SODIUM CHLORIDE (POUR BTL) OPTIME
TOPICAL | Status: DC | PRN
  Administered 2019-05-30: 1000 mL

## 2019-05-30 MED ORDER — PROPOFOL 10 MG/ML IV BOLUS
INTRAVENOUS | Status: AC
  Filled 2019-05-30: qty 20

## 2019-05-30 MED ORDER — ONDANSETRON HCL 4 MG PO TABS: 4.0000 mg | ORAL_TABLET | Freq: Four times a day (QID) | ORAL | Status: DC | PRN

## 2019-05-30 MED ORDER — PROPOFOL 10 MG/ML IV BOLUS
INTRAVENOUS | Status: DC | PRN
  Administered 2019-05-30: 150 mg via INTRAVENOUS

## 2019-05-30 MED ORDER — LIDOCAINE-EPINEPHRINE 1 %-1:100000 IJ SOLN
INTRAMUSCULAR | Status: DC | PRN
  Administered 2019-05-30: 10 mL

## 2019-05-30 MED ORDER — CEFAZOLIN SODIUM-DEXTROSE 2-4 GM/100ML-% IV SOLN
2.0000 g | Freq: Three times a day (TID) | INTRAVENOUS | Status: AC
  Administered 2019-05-30 – 2019-05-31 (×2): 2 g via INTRAVENOUS
  Filled 2019-05-30 (×2): qty 100

## 2019-05-30 MED ORDER — SUGAMMADEX SODIUM 200 MG/2ML IV SOLN
INTRAVENOUS | Status: DC | PRN
  Administered 2019-05-30: 200 mg via INTRAVENOUS

## 2019-05-30 MED ORDER — LACTATED RINGERS IV SOLN
INTRAVENOUS | Status: DC | PRN
  Administered 2019-05-30 (×2): via INTRAVENOUS

## 2019-05-30 MED ORDER — ACETAMINOPHEN 10 MG/ML IV SOLN
INTRAVENOUS | Status: DC | PRN
  Administered 2019-05-30: 1000 mg via INTRAVENOUS

## 2019-05-30 MED ORDER — THROMBIN 5000 UNITS EX SOLR
CUTANEOUS | Status: AC
  Filled 2019-05-30: qty 10000

## 2019-05-30 MED ORDER — FENTANYL CITRATE (PF) 250 MCG/5ML IJ SOLN
INTRAMUSCULAR | Status: AC
  Filled 2019-05-30: qty 5

## 2019-05-30 MED ORDER — ALUM & MAG HYDROXIDE-SIMETH 200-200-20 MG/5ML PO SUSP: 30.0000 mL | Freq: Four times a day (QID) | ORAL | Status: DC | PRN

## 2019-05-30 MED ORDER — PHENOL 1.4 % MT LIQD: 1.0000 | OROMUCOSAL | Status: DC | PRN

## 2019-05-30 MED ORDER — BUPIVACAINE HCL (PF) 0.25 % IJ SOLN
INTRAMUSCULAR | Status: DC | PRN
  Administered 2019-05-30: 20 mL

## 2019-05-30 MED ORDER — VANCOMYCIN HCL IN DEXTROSE 1-5 GM/200ML-% IV SOLN
INTRAVENOUS | Status: AC
  Filled 2019-05-30: qty 200

## 2019-05-30 MED ORDER — ROCURONIUM BROMIDE 50 MG/5ML IV SOSY
PREFILLED_SYRINGE | INTRAVENOUS | Status: DC | PRN
  Administered 2019-05-30: 50 mg via INTRAVENOUS

## 2019-05-30 MED ORDER — ONDANSETRON HCL 4 MG/2ML IJ SOLN
INTRAMUSCULAR | Status: DC | PRN
  Administered 2019-05-30: 4 mg via INTRAVENOUS

## 2019-05-30 MED ORDER — ACETAMINOPHEN 650 MG RE SUPP: 650.0000 mg | RECTAL | Status: DC | PRN

## 2019-05-30 MED ORDER — LIDOCAINE-EPINEPHRINE 1 %-1:100000 IJ SOLN
INTRAMUSCULAR | Status: AC
  Filled 2019-05-30: qty 1

## 2019-05-30 MED ORDER — SODIUM CHLORIDE 0.9 % IV SOLN
INTRAVENOUS | Status: DC | PRN
  Administered 2019-05-30: 500 mL

## 2019-05-30 MED ORDER — MIDAZOLAM HCL 2 MG/2ML IJ SOLN
INTRAMUSCULAR | Status: AC
  Filled 2019-05-30: qty 2

## 2019-05-30 MED ORDER — MENTHOL 3 MG MT LOZG: 1.0000 | LOZENGE | OROMUCOSAL | Status: DC | PRN

## 2019-05-30 MED ORDER — PANTOPRAZOLE SODIUM 40 MG IV SOLR
40.0000 mg | Freq: Every day | INTRAVENOUS | Status: DC
  Administered 2019-05-30: 40 mg via INTRAVENOUS
  Filled 2019-05-30: qty 40

## 2019-05-30 MED ORDER — VANCOMYCIN HCL 1000 MG IV SOLR
INTRAVENOUS | Status: DC | PRN
  Administered 2019-05-30: 16:00:00 1000 mg via INTRAVENOUS

## 2019-05-30 MED ORDER — CYCLOBENZAPRINE HCL 10 MG PO TABS
10.0000 mg | ORAL_TABLET | Freq: Three times a day (TID) | ORAL | Status: DC | PRN
  Administered 2019-05-31: 10 mg via ORAL
  Filled 2019-05-30: qty 1

## 2019-05-30 MED ORDER — THROMBIN 5000 UNITS EX SOLR
CUTANEOUS | Status: DC | PRN
  Administered 2019-05-30 (×2): 5000 [IU] via TOPICAL

## 2019-05-30 MED ORDER — SODIUM CHLORIDE 0.9% FLUSH: 3.0000 mL | INTRAVENOUS | Status: DC | PRN

## 2019-05-30 MED ORDER — SODIUM CHLORIDE 0.9% FLUSH
3.0000 mL | Freq: Two times a day (BID) | INTRAVENOUS | Status: DC
  Administered 2019-05-30 – 2019-06-05 (×10): 3 mL via INTRAVENOUS

## 2019-05-30 SURGICAL SUPPLY — 59 items
BAG DECANTER FOR FLEXI CONT (MISCELLANEOUS) ×3 IMPLANT
BENZOIN TINCTURE PRP APPL 2/3 (GAUZE/BANDAGES/DRESSINGS) ×3 IMPLANT
BLADE CLIPPER SURG (BLADE) IMPLANT
BLADE SURG 11 STRL SS (BLADE) ×3 IMPLANT
BUR CUTTER 7.0 ROUND (BURR) ×3 IMPLANT
BUR MATCHSTICK NEURO 3.0 LAGG (BURR) ×3 IMPLANT
CANISTER SUCT 3000ML PPV (MISCELLANEOUS) ×3 IMPLANT
CARTRIDGE OIL MAESTRO DRILL (MISCELLANEOUS) ×1 IMPLANT
CLOSURE WOUND 1/2 X4 (GAUZE/BANDAGES/DRESSINGS) ×1
COVER WAND RF STERILE (DRAPES) ×1 IMPLANT
DECANTER SPIKE VIAL GLASS SM (MISCELLANEOUS) ×3 IMPLANT
DERMABOND ADVANCED (GAUZE/BANDAGES/DRESSINGS) ×2
DERMABOND ADVANCED .7 DNX12 (GAUZE/BANDAGES/DRESSINGS) ×1 IMPLANT
DIFFUSER DRILL AIR PNEUMATIC (MISCELLANEOUS) ×3 IMPLANT
DRAPE HALF SHEET 40X57 (DRAPES) IMPLANT
DRAPE LAPAROTOMY 100X72X124 (DRAPES) ×3 IMPLANT
DRAPE MICROSCOPE LEICA (MISCELLANEOUS) ×3 IMPLANT
DRAPE SURG 17X23 STRL (DRAPES) ×3 IMPLANT
DRSG OPSITE POSTOP 4X6 (GAUZE/BANDAGES/DRESSINGS) ×2 IMPLANT
DURAPREP 26ML APPLICATOR (WOUND CARE) ×3 IMPLANT
ELECT BLADE 4.0 EZ CLEAN MEGAD (MISCELLANEOUS) ×3
ELECT REM PT RETURN 9FT ADLT (ELECTROSURGICAL) ×3
ELECTRODE BLDE 4.0 EZ CLN MEGD (MISCELLANEOUS) IMPLANT
ELECTRODE REM PT RTRN 9FT ADLT (ELECTROSURGICAL) ×1 IMPLANT
EVACUATOR 1/8 PVC DRAIN (DRAIN) ×2 IMPLANT
GAUZE 4X4 16PLY RFD (DISPOSABLE) ×2 IMPLANT
GAUZE SPONGE 4X4 12PLY STRL (GAUZE/BANDAGES/DRESSINGS) ×1 IMPLANT
GLOVE BIO SURGEON STRL SZ7 (GLOVE) ×2 IMPLANT
GLOVE BIO SURGEON STRL SZ8 (GLOVE) ×3 IMPLANT
GLOVE BIOGEL PI IND STRL 7.0 (GLOVE) IMPLANT
GLOVE BIOGEL PI IND STRL 7.5 (GLOVE) IMPLANT
GLOVE BIOGEL PI INDICATOR 7.0 (GLOVE) ×2
GLOVE BIOGEL PI INDICATOR 7.5 (GLOVE) ×4
GLOVE EXAM NITRILE XL STR (GLOVE) IMPLANT
GLOVE INDICATOR 8.5 STRL (GLOVE) ×3 IMPLANT
GLOVE SURG SS PI 7.5 STRL IVOR (GLOVE) ×2 IMPLANT
GOWN STRL REUS W/ TWL LRG LVL3 (GOWN DISPOSABLE) ×1 IMPLANT
GOWN STRL REUS W/ TWL XL LVL3 (GOWN DISPOSABLE) ×2 IMPLANT
GOWN STRL REUS W/TWL 2XL LVL3 (GOWN DISPOSABLE) IMPLANT
GOWN STRL REUS W/TWL LRG LVL3 (GOWN DISPOSABLE) ×2
GOWN STRL REUS W/TWL XL LVL3 (GOWN DISPOSABLE) ×4
KIT BASIN OR (CUSTOM PROCEDURE TRAY) ×3 IMPLANT
KIT TURNOVER KIT B (KITS) ×3 IMPLANT
NDL SPNL 22GX3.5 QUINCKE BK (NEEDLE) ×1 IMPLANT
NEEDLE HYPO 22GX1.5 SAFETY (NEEDLE) ×3 IMPLANT
NEEDLE SPNL 22GX3.5 QUINCKE BK (NEEDLE) ×3 IMPLANT
NS IRRIG 1000ML POUR BTL (IV SOLUTION) ×3 IMPLANT
OIL CARTRIDGE MAESTRO DRILL (MISCELLANEOUS) ×3
PACK LAMINECTOMY NEURO (CUSTOM PROCEDURE TRAY) ×3 IMPLANT
RUBBERBAND STERILE (MISCELLANEOUS) ×6 IMPLANT
SPONGE SURGIFOAM ABS GEL SZ50 (HEMOSTASIS) ×3 IMPLANT
STRIP CLOSURE SKIN 1/2X4 (GAUZE/BANDAGES/DRESSINGS) ×2 IMPLANT
SUT VIC AB 0 CT1 18XCR BRD8 (SUTURE) ×1 IMPLANT
SUT VIC AB 0 CT1 8-18 (SUTURE) ×2
SUT VIC AB 2-0 CT1 18 (SUTURE) ×3 IMPLANT
SUT VICRYL 4-0 PS2 18IN ABS (SUTURE) ×3 IMPLANT
TOWEL GREEN STERILE (TOWEL DISPOSABLE) ×3 IMPLANT
TOWEL GREEN STERILE FF (TOWEL DISPOSABLE) ×3 IMPLANT
WATER STERILE IRR 1000ML POUR (IV SOLUTION) ×3 IMPLANT

## 2019-05-30 NOTE — Progress Notes (Signed)
Pain meds given for chronic lower back pain -made comfortable,repositioned, purick in place.

## 2019-05-30 NOTE — Progress Notes (Signed)
PT Cancellation Note  Patient Details Name: Alice Kemp MRN: [id number] DOB: Apr 30, 1951   Cancelled Treatment:    Reason Eval/Treat Not Completed: Other (comment)   Noted plans for OR for microdiscectomy this afternoon;   Will plan to follow up post-op;   Roney Marion, [place]  Acute Rehabilitation Services Pager [phone] Office [place] 05/30/2019, 8:36 AM

## 2019-05-30 NOTE — Progress Notes (Signed)
Patient sleeping on and off, awaken to req pain meds  And bedpan.  Patient had large BM, voided amber colored urine, compalin of pain lower back, states prior dose of pain med worked well. Patient made aware of NPO status for OR in am verbalized understanding.

## 2019-05-30 NOTE — Plan of Care (Signed)
  Problem: Clinical Measurements: Goal: Will remain free from infection Outcome: Not Progressing Goal: Diagnostic test results will improve Outcome: Not Progressing Goal: Cardiovascular complication will be avoided Outcome: Not Progressing   Problem: Activity: Goal: Risk for activity intolerance will decrease Outcome: Not Progressing   Problem: Coping: Goal: Level of anxiety will decrease Outcome: Not Progressing

## 2019-05-30 NOTE — Anesthesia Procedure Notes (Signed)
Procedure Name: Intubation Date/Time: 05/30/2019 4:10 PM Performed by: Babs Bertin, CRNA Pre-anesthesia Checklist: Patient identified, Emergency Drugs available, Suction available and Patient being monitored Patient Re-evaluated:Patient Re-evaluated prior to induction Oxygen Delivery Method: Circle System Utilized Preoxygenation: Pre-oxygenation with 100% oxygen Induction Type: IV induction and Rapid sequence Laryngoscope Size: Mac and 3 Grade View: Grade II Tube type: Oral Tube size: 7.0 mm Number of attempts: 1 Airway Equipment and Method: Stylet and Oral airway Placement Confirmation: ETT inserted through vocal cords under direct vision,  positive ETCO2 and breath sounds checked- equal and bilateral Secured at: 21 cm Tube secured with: Tape Dental Injury: Teeth and Oropharynx as per pre-operative assessment

## 2019-05-30 NOTE — Progress Notes (Signed)
Awake and req pain medication, reinforced NPO for surgical procedure, to be medicated as indicated by physician for procedure. Weighed and made comfortable with repositioning.

## 2019-05-30 NOTE — Progress Notes (Signed)
PROGRESS NOTE    Alice Kemp  [id number] DOB: [birth year] DOA: 05/24/2019 PCP: [name], MD   Brief Narrative: Alice Kemp is a 68 y.o. female with medical history significant of anxiety, depression, osteoarthritis, chronic back pain, benign colon neoplasm, history of left breast abscess, cervical radiculopathy, chronic kidney disease, constipation, type 2 diabetes, chronic diastolic heart failure, glaucoma, history of gout, hyperlipidemia, hypothyroidism, morbid obesity, obstructive sleep apnea, pericardial effusion, pulmonary hypertension, vitamin B12 and vitamin D deficiency. She presented secondary to worsening low back pain and also with abdominal cellulitis. COVID-19 positive from previous infection.   Assessment & Plan:   Principal Problem:   Cellulitis of abdominal wall Active Problems:   Uncontrolled type 2 diabetes mellitus with stage 3 chronic kidney disease (HCC)   Morbid obesity (HCC)   Hypothyroidism   Chronic diastolic heart failure (HCC)   Essential hypertension   AKI (acute kidney injury) ([place])   Hyponatremia   Anemia   Lower back pain with radiculopathy Associated LE weakness. 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 Neurosurgery consulted with plans for surgery this afternoon  Abdominal wound/cellulitis Improving on antibiotics. Empirically treated with ceftriaxone and transitioned to Keflex -Continue Keflex, end date 8/27  AKI Baseline creatinine of about 1.8. Creatinine of 2.47 on admission with a peak of 2.47. Currently down to below baseline at 1.5  Chronic diastolic heart failure Euvolemic  Diabetes mellitus, type 2 -Continue SSI  Hypothyroidism -Continue Synthroid  Anemia Unsure of etiology. Possibly secondary to kidney disease. Iron panel obtained and is normal. No obvious source of bleeding -CBC as needed  Hyponatremia Thought to be  secondary to diuretic use and GI loses. Given IV fluids. Now resolved.  Essential hypertension Stable.  Hyperlipidemia -Continue Crestor  Morbid obesity Body mass index is 43.9 kg/m.  COVID-19 positive Patient has been asymptomatic for weeks. Currently on precautions  DVT prophylaxis: SCDs Code Status:   Code Status: Full Code Family Communication: None Disposition Plan: Discharge pending neurosurgery recommendations/management; PT recommendations post surgery   Consultants:   Neurosurgery  Procedures:   None  Antimicrobials:  Ceftriaxone  Keflex    Subjective: No issues overnight.   Objective: Vitals:   05/29/19 2000 05/29/19 2307 05/30/19 0500 05/30/19 0827  BP: 113/68 110/68  123/61  Pulse: 71 77  81  Resp: 18 18  20   Temp: 98.4 F (36.9 C) 98.9 F (37.2 C)  98.8 F (37.1 C)  TempSrc: Oral Oral  Oral  SpO2: 92% 93%  92%  Weight:   116 kg   Height:        Intake/Output Summary (Last 24 hours) at 05/30/2019 1257 Last data filed at 05/30/2019 1000 Gross per 24 hour  Intake 30 ml  Output 2450 ml  Net -2420 ml   Filed Weights   05/25/19 0435 05/29/19 0606 05/30/19 0500  Weight: 116.2 kg 117.3 kg 116 kg    Examination:  General exam: Appears calm and comfortable Respiratory system: Clear to auscultation. Respiratory effort normal. Cardiovascular system: S1 & S2 heard, RRR. No murmurs, rubs, gallops or clicks. Gastrointestinal system: Abdomen is nondistended, soft and nontender. No organomegaly or masses felt. Normal bowel sounds heard. Central nervous system: Alert and oriented. No focal neurological deficits. Extremities: No edema. No calf tenderness Skin: No cyanosis. Abdomen with areas of mild erythema Psychiatry: Judgement and insight appear normal. Mood & affect appropriate.     Data Reviewed: I have personally  reviewed following labs and imaging studies  CBC: Recent Labs  Lab 05/24/19 0838 05/25/19 0412 05/26/19 0429 05/28/19  0854 05/29/19 0546  WBC 10.0 11.7* 11.1* 8.4 9.5  NEUTROABS 5.4  --  6.1 4.6 4.3  HGB 9.3* 9.1* 8.6* 8.3* 8.4*  HCT 29.3* 29.5* 28.2* 26.9* 27.0*  MCV 94.2 96.7 97.6 95.4 96.1  PLT 225 210 225 244 [id number]   Basic Metabolic Panel: Recent Labs  Lab 05/24/19 0838 05/25/19 0412 05/26/19 0429 05/28/19 0854 05/29/19 0546  NA 134* 134* 131* 135 137  K 4.5 4.8 5.1 5.1 4.9  CL 103 105 101 105 105  CO2 24 21* 19* 20* 23  GLUCOSE 179* 147* 168* 147* 130*  BUN 40* 40* 32* 29* 32*  CREATININE 2.47* 2.54* 1.97* 1.47* 1.52*  CALCIUM 8.4* 8.2* 8.2* 8.7* 8.9  MG  --   --  1.8 1.7 1.9  PHOS  --   --  4.0 4.0 4.7*   GFR: Estimated Creatinine Clearance: 44.3 mL/min (A) (by C-G formula based on SCr of 1.52 mg/dL (H)). Liver Function Tests: Recent Labs  Lab 05/24/19 0838 05/25/19 0412 05/26/19 0429 05/28/19 0854 05/29/19 0546  AST 16 16  --   --   --   ALT 12 11  --   --   --   ALKPHOS 44 42  --   --   --   BILITOT 0.7 0.3  --   --   --   PROT 6.7 6.3*  --   --   --   ALBUMIN 2.8* 2.5* 2.5* 2.3* 2.3*   No results for input(s): LIPASE, AMYLASE in the last 168 hours. No results for input(s): AMMONIA in the last 168 hours. Coagulation Profile: No results for input(s): INR, PROTIME in the last 168 hours. Cardiac Enzymes: No results for input(s): CKTOTAL, CKMB, CKMBINDEX, TROPONINI in the last 168 hours. BNP (last 3 results) No results for input(s): PROBNP in the last 8760 hours. HbA1C: No results for input(s): HGBA1C in the last 72 hours. CBG: Recent Labs  Lab 05/29/19 1238 05/29/19 1626 05/29/19 2223 05/30/19 0818 05/30/19 1145  GLUCAP 182* 166* 136* 141* 136*   Lipid Profile: No results for input(s): CHOL, HDL, LDLCALC, TRIG, CHOLHDL, LDLDIRECT in the last 72 hours. Thyroid Function Tests: No results for input(s): TSH, T4TOTAL, FREET4, T3FREE, THYROIDAB in the last 72 hours. Anemia Panel: No results for input(s): VITAMINB12, FOLATE, FERRITIN, TIBC, IRON, RETICCTPCT in the  last 72 hours. Sepsis Labs: No results for input(s): PROCALCITON, LATICACIDVEN in the last 168 hours.  Recent Results (from the past 240 hour(s))  SARS Coronavirus 2 [facility] order, Performed in [facility] hospital lab) Nasopharyngeal Nasopharyngeal Swab     Status: Abnormal   Collection Time: 05/24/19  4:50 AM   Specimen: Nasopharyngeal Swab  Result Value Ref Range Status   SARS Coronavirus 2 POSITIVE (A) NEGATIVE Final    Comment: RESULT CALLED TO, READ BACK BY AND VERIFIED WITH: JENNIFER HUFF, RN [id number] @ [id number] BY J SCOTTON (NOTE) If result is NEGATIVE SARS-CoV-2 target nucleic acids are NOT DETECTED. The SARS-CoV-2 RNA is generally detectable in upper and lower  respiratory specimens during the acute phase of infection. The lowest  concentration of SARS-CoV-2 viral copies this assay can detect is 250  copies / mL. A negative result does not preclude SARS-CoV-2 infection  and should not be used as the sole basis for treatment or other  patient management decisions.  A negative result may occur with  improper  specimen collection / handling, submission of specimen other  than nasopharyngeal swab, presence of viral mutation(s) within the  areas targeted by this assay, and inadequate number of viral copies  (<250 copies / mL). A negative result must be combined with clinical  observations, patient history, and epidemiological information. If result is POSITIVE SARS-CoV-2 target nucleic acids are DET ECTED. The SARS-CoV-2 RNA is generally detectable in upper and lower  respiratory specimens during the acute phase of infection.  Positive  results are indicative of active infection with SARS-CoV-2.  Clinical  correlation with patient history and other diagnostic information is  necessary to determine patient infection status.  Positive results do  not rule out bacterial infection or co-infection with other viruses. If result is PRESUMPTIVE POSTIVE SARS-CoV-2 nucleic acids MAY BE  PRESENT.   A presumptive positive result was obtained on the submitted specimen  and confirmed on repeat testing.  While 2019 novel coronavirus  (SARS-CoV-2) nucleic acids may be present in the submitted sample  additional confirmatory testing may be necessary for epidemiological  and / or clinical management purposes  to differentiate between  SARS-CoV-2 and other Sarbecovirus currently known to infect humans.  If clinically indicated additional testing with an alternate test  methodology ([place]) is advised. The SARS-CoV-2 RNA is generally  detectable in upper and lower respiratory specimens during the acute  phase of infection. The expected result is Negative. Fact Sheet for Patients:  StrictlyIdeas.no Fact Sheet for Healthcare Providers: BankingDealers.co.za This test is not yet approved or cleared by the [place] FDA and has been authorized for detection and/or diagnosis of SARS-CoV-2 by FDA under an Emergency Use Authorization (EUA).  This EUA will remain in effect (meaning this test can be used) for the duration of the COVID-19 declaration under Section 564(b)(1) of the Act, 21 U.S.C. section 360bbb-3(b)(1), unless the authorization is terminated or revoked sooner. Performed at [facility], [place] [address], [place],  [address]          Radiology Studies: Mr Lumbar Spine Wo Contrast  Result Date: 05/28/2019 CLINICAL DATA:  Bilateral lower extremity weakness with back pain EXAM: MRI LUMBAR SPINE WITHOUT CONTRAST TECHNIQUE: Multiplanar, multisequence MR imaging of the lumbar spine was performed. No intravenous contrast was administered. COMPARISON:  Lumbar radiography 05/23/2019 FINDINGS: Segmentation:  5 lumbar type vertebrae Alignment:  L4-5 grade 1 anterolisthesis. Vertebrae: No fracture, evidence of discitis, or bone lesion. Discogenic endplate edema at [id number]. Conus medullaris and cauda equina:  Conus extends to the L1 level. Conus and cauda equina appear normal. Paraspinal and other soft tissues: No acute finding Disc levels: T12- L1: Advanced disc narrowing with right eccentric bulging. Degenerative cyst along the right ligamentum flavum without apparent cord impingement. The foramina remain patent. L1-L2: Unremarkable. L2-L3: Disc narrowing and bulging. There is amorphous material within the spinal canal at L2-3 to the mid L3 body. Posterior element hypertrophy with patent spinal canal. L3-L4: Disc narrowing and circumferential bulging with annular fissure. Facet hypertrophy. Advanced spinal stenosis. Patent foramina L4-L5: Advanced facet arthropathy with spurring and anterolisthesis. The disc is narrowed and bulging, greater towards the right where there is a herniation. High-grade spinal stenosis and right foraminal impingement. L5-S1:Facet spurring with left-sided joint effusion. Mild disc narrowing and bulging with annular fissure. No impingement Motion degraded, especially axial images. IMPRESSION: 1. 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 Postcontrast imaging may be contributory. 2. L3-4 and  L4-5 advanced degenerative spinal stenosis. 3. L4-5 right foraminal impingement. 4. Moderate motion degradation. Electronically Signed   By: Monte Fantasia M.D.   On: 05/28/2019 22:28        Scheduled Meds: . allopurinol  100 mg Oral Daily  . aspirin EC  81 mg Oral Daily  . buPROPion  200 mg Oral Daily  . cephALEXin  500 mg Oral Q12H  . docusate sodium  100 mg Oral BID  . DULoxetine  30 mg Oral BID  . gabapentin  300 mg Oral TID  . insulin aspart  0-15 Units Subcutaneous TID WC  . insulin aspart protamine- aspart  8 Units Subcutaneous BID WC  . levothyroxine  88 mcg Oral QAC breakfast  . loratadine  10 mg Oral QHS  . magnesium oxide  200 mg Oral Daily  . rosuvastatin  5 mg Oral Daily  .  vitamin B-12  500 mcg Oral BID   Continuous Infusions: . methocarbamol (ROBAXIN) IV 500 mg (05/29/19 1637)     LOS: 6 days     Cordelia Poche, MD [facility] 05/30/2019, 12:57 PM  If 7PM-7AM, please contact night-coverage [email or web address]

## 2019-05-30 NOTE — Anesthesia Preprocedure Evaluation (Addendum)
Anesthesia Evaluation  Patient identified by MRN, date of birth, ID band Patient awake    Reviewed: Allergy & Precautions, NPO status , Patient's Chart, lab work & pertinent test results  Airway Mallampati: II  TM Distance: >3 FB     Dental   Pulmonary shortness of breath, sleep apnea ,    breath sounds clear to auscultation       Cardiovascular hypertension,  Rhythm:Regular Rate:Normal     Neuro/Psych PSYCHIATRIC DISORDERS Anxiety Depression  Neuromuscular disease    GI/Hepatic   Endo/Other  diabetesHypothyroidism   Renal/GU Renal disease     Musculoskeletal   Abdominal   Peds  Hematology  (+) anemia ,   Anesthesia Other Findings   Reproductive/Obstetrics                            Anesthesia Physical Anesthesia Plan  ASA: IV  Anesthesia Plan: General   Post-op Pain Management:    Induction: Intravenous  PONV Risk Score and Plan: Dexamethasone, Ondansetron and Midazolam  Airway Management Planned: Oral ETT  Additional Equipment:   Intra-op Plan:   Post-operative Plan: Possible Post-op intubation/ventilation  Informed Consent: I have reviewed the patients History and Physical, chart, labs and discussed the procedure including the risks, benefits and alternatives for the proposed anesthesia with the patient or authorized representative who has indicated his/her understanding and acceptance.     Dental advisory given  Plan Discussed with: CRNA and Anesthesiologist  Anesthesia Plan Comments:         Anesthesia Quick Evaluation

## 2019-05-30 NOTE — Op Note (Signed)
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  With microscopic discectomy from the left and foraminotomies of the left L3 nerve root.  Surgeon: Dominica Severin Maanya Hippert  Anesthesia: General  EBL: Less than 200  HPI: Patient is a 68 year old female who presented with back pain as well as cellulitic type infection of the abdominal wall and was covered positive.  Work-up with an MRI scan showed large amount of amorphous material behind the L3 vertebral body.  Patient's clinical exam was consistent with bilateral foot drops worse on the left and increased left lower extremity weakness however weakness was stable over the week of the admission.  Patient had been treated with IV antibiotics and was currently on oral course for the cellulitis so she was transferred from [facility] over to [facility] and recommended decompression and discectomy.  I extensively went over the risks and benefits of the operation with the patient as well as perioperative course expectations of outcome and alternatives of surgery and she understood and agreed to proceed forward.  Operative procedure: Patient brought into the OR was used in general anesthesia positioned prone Wilson frame her back was prepped and draped in routine sterile fashion after infiltration of 10 cc lidocaine with epi midline incision was made and Bovie elect cautery was used take down subcutaneous tissue and subperiosteal dissection was carried lamina of L2 and L3 and the superior aspect of lamina L4.  MRI scan showed the disc herniation to be a large fragment right behind the L3 vertebral body so I planned on exposure from just above the L2-3 to space down to the L3-4 to space.  Intraoperative x-ray confirmed identification of 2 3 to space so I remove the entire spinous process at L3 inferior aspect the spinous process at L2  and performed central decompressive laminectomy to just above the 2 3 to space I marched through informed almost a complete decompressive laminectomy bilaterally at L3 I shaved off a little more the facet joint on the left side try to preserve all the facet joint of the right immediately after identified the ligament flavum which was noted markedly hypertrophied I remove that in piecemeal fashion thecal sac was being significantly pushed all the way across the midline to the right and presenting dorsally to the thecal sac was several large fragments of disc.  I teased these out with a nerve hook that the thecal sac started to return back to its normal anatomic position.  I shaved off a little more the medial facet complex in the left and under microscopic illumination I under bit that medial facet complex identified the 2 3 to space.  I also identified the L3 nerve root and unroofed the L3 foramen.  Working from both above and below the L3 nerve root several large additional fragments of disc removed and then entered the space and cleaned out the disc base with Epstein curettes and pituitary rongeurs.  At the end discectomy there is no further stenosis on thecal sac I was able pass a long ball-tipped probe all the way across the midline down below the L3 nerve root and then working below the L3 nerve root all the way down to the 3 4 the space.  There was no further fragments appreciated.  Wounds copiously irrigated meticulous hemostasis was maintained a Hemovac drain was placed and the wound was closed in layers with interrupted Vicryl in a running  4 subcuticular Dermabond benzoin Steri-Strips and a sterile dressing was applied patient recovery in stable condition.  At the end the case on needle count sponge counts were correct.

## 2019-05-30 NOTE — Progress Notes (Signed)
Asleep since last mast medicated. Easily aroused, mo distress noted. Reviewed with patient plan of care with verbalize agreement with plan,

## 2019-05-30 NOTE — Transfer of Care (Signed)
Immediate Anesthesia Transfer of Care Note  Patient: Alice Kemp  Procedure(s) Performed: Left Decompressive Lumbar Laminectomy and Microdiscectomy lumbar three-four (Left Back)  Patient Location: PACU RN recovering in OR  Anesthesia Type:General  Level of Consciousness: awake, alert  and patient cooperative  Airway & Oxygen Therapy: Patient Spontanous Breathing and Patient connected to face mask oxygen  Post-op Assessment: Report given to RN, Post -op Vital signs reviewed and stable and Patient moving all extremities X 4  Post vital signs: Reviewed and stable  Last Vitals:  Vitals Value Taken Time  BP    Temp    Pulse    Resp    SpO2      Last Pain:  Vitals:   05/30/19 1512  TempSrc: Oral  PainSc:       Patients Stated Pain Goal: 3 ([id number])  Complications: No apparent anesthesia complications

## 2019-05-31 ENCOUNTER — Encounter (HOSPITAL_COMMUNITY): Payer: Self-pay | Admitting: Neurosurgery

## 2019-05-31 LAB — GLUCOSE, CAPILLARY
Glucose-Capillary: 138 mg/dL — ABNORMAL HIGH (ref 70–99)
Glucose-Capillary: 113 mg/dL — ABNORMAL HIGH (ref 70–99)
Glucose-Capillary: 143 mg/dL — ABNORMAL HIGH (ref 70–99)
Glucose-Capillary: 148 mg/dL — ABNORMAL HIGH (ref 70–99)

## 2019-05-31 MED ORDER — METHOCARBAMOL 500 MG PO TABS
500.0000 mg | ORAL_TABLET | Freq: Three times a day (TID) | ORAL | Status: DC | PRN
  Administered 2019-05-31 – 2019-06-04 (×5): 500 mg via ORAL
  Filled 2019-05-31 (×5): qty 1

## 2019-05-31 MED ORDER — PANTOPRAZOLE SODIUM 40 MG PO TBEC
40.0000 mg | DELAYED_RELEASE_TABLET | Freq: Every day | ORAL | Status: DC
  Administered 2019-05-31 – 2019-06-05 (×6): 40 mg via ORAL
  Filled 2019-05-31 (×6): qty 1

## 2019-05-31 NOTE — Progress Notes (Signed)
PROGRESS NOTE    Alice Kemp  [id number] DOB: [birth year] DOA: 05/24/2019 PCP: [name], MD   Brief Narrative: Alice Kemp is a 68 y.o. female with medical history significant of anxiety, depression, osteoarthritis, chronic back pain, benign colon neoplasm, history of left breast abscess, cervical radiculopathy, chronic kidney disease, constipation, type 2 diabetes, chronic diastolic heart failure, glaucoma, history of gout, hyperlipidemia, hypothyroidism, morbid obesity, obstructive sleep apnea, pericardial effusion, pulmonary hypertension, vitamin B12 and vitamin D deficiency. She presented secondary to worsening low back pain and also with abdominal cellulitis. COVID-19 positive from previous infection.   Assessment & Plan:   Principal Problem:   Cellulitis of abdominal wall Active Problems:   Uncontrolled type 2 diabetes mellitus with stage 3 chronic kidney disease (HCC)   Morbid obesity (HCC)   Hypothyroidism   Chronic diastolic heart failure (HCC)   Essential hypertension   AKI (acute kidney injury) (HCC)   Hyponatremia   Anemia   HNP (herniated nucleus pulposus), lumbar   Lower back pain with radiculopathy Associated LE weakness. 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 S/p lumbar laminectomy and microdiscectomy on 8/24 -Neurosurgery recommendations  Abdominal wound/cellulitis Improving on antibiotics. Empirically treated with ceftriaxone and transitioned to Keflex -Continue Keflex, end date 8/27  Lethargy Secondary to combination of dilaudid and flexeril -Discontinue dilaudid -Pulse oximetry, if worsens, will obtain ABG/give Narcan  AKI Baseline creatinine of about 1.8. Creatinine of 2.47 on admission with a peak of 2.47. Currently down to below baseline at 1.5 -Repeat BMP  Chronic diastolic heart failure Euvolemic  Diabetes mellitus, type 2 -Continue SSI   Hypothyroidism -Continue Synthroid  Anemia Unsure of etiology. Possibly secondary to kidney disease. Iron panel obtained and is normal. No obvious source of bleeding -CBC as needed  Hyponatremia Thought to be secondary to diuretic use and GI loses. Given IV fluids. Now resolved.  Essential hypertension Stable.  Hyperlipidemia -Continue Crestor  Morbid obesity Body mass index is 43.9 kg/m.  COVID-19 positive Patient has been asymptomatic for weeks. Currently on precautions  DVT prophylaxis: SCDs Code Status:   Code Status: Full Code Family Communication: None Disposition Plan: Discharge pending neurosurgery recommendations/management; SNF   Consultants:   Neurosurgery  Procedures:   None  Antimicrobials:  Ceftriaxone  Keflex    Subjective: Lethargic  Objective: Vitals:   05/30/19 2250 05/31/19 0000 05/31/19 0414 05/31/19 0800  BP: 115/62 (!) 101/58 (!) 118/57 122/66  Pulse: 77 75 78   Resp: 20 18 20 18   Temp: 97.8 F (36.6 C) 98.1 F (36.7 C) 98.3 F (36.8 C) 99.1 F (37.3 C)  TempSrc: Oral Oral Oral Oral  SpO2: 95% 100% 95% 95%  Weight:      Height:        Intake/Output Summary (Last 24 hours) at 05/31/2019 1531 Last data filed at 05/31/2019 0800 Gross per 24 hour  Intake 1905 ml  Output 1800 ml  Net 105 ml   Filed Weights   05/25/19 0435 05/29/19 0606 05/30/19 0500  Weight: 116.2 kg 117.3 kg 116 kg    Examination:  General exam: Appears calm and comfortable Respiratory system: Clear to auscultation. Respiratory effort normal. Cardiovascular system: S1 & S2 heard, RRR. No murmurs, rubs, gallops or clicks. Gastrointestinal system: Abdomen is nondistended, soft and nontender. No organomegaly or masses felt. Normal bowel sounds heard. Central nervous system: Lethargic. No focal neurological deficits. Extremities: No edema. No calf tenderness Skin: No cyanosis.  Data Reviewed: I have personally reviewed following labs and imaging  studies  CBC: Recent Labs  Lab 05/25/19 0412 05/26/19 0429 05/28/19 0854 05/29/19 0546  WBC 11.7* 11.1* 8.4 9.5  NEUTROABS  --  6.1 4.6 4.3  HGB 9.1* 8.6* 8.3* 8.4*  HCT 29.5* 28.2* 26.9* 27.0*  MCV 96.7 97.6 95.4 96.1  PLT 210 225 244 [id number]   Basic Metabolic Panel: Recent Labs  Lab 05/25/19 0412 05/26/19 0429 05/28/19 0854 05/29/19 0546  NA 134* 131* 135 137  K 4.8 5.1 5.1 4.9  CL 105 101 105 105  CO2 21* 19* 20* 23  GLUCOSE 147* 168* 147* 130*  BUN 40* 32* 29* 32*  CREATININE 2.54* 1.97* 1.47* 1.52*  CALCIUM 8.2* 8.2* 8.7* 8.9  MG  --  1.8 1.7 1.9  PHOS  --  4.0 4.0 4.7*   GFR: Estimated Creatinine Clearance: 44.3 mL/min (A) (by C-G formula based on SCr of 1.52 mg/dL (H)). Liver Function Tests: Recent Labs  Lab 05/25/19 0412 05/26/19 0429 05/28/19 0854 05/29/19 0546  AST 16  --   --   --   ALT 11  --   --   --   ALKPHOS 42  --   --   --   BILITOT 0.3  --   --   --   PROT 6.3*  --   --   --   ALBUMIN 2.5* 2.5* 2.3* 2.3*   No results for input(s): LIPASE, AMYLASE in the last 168 hours. No results for input(s): AMMONIA in the last 168 hours. Coagulation Profile: No results for input(s): INR, PROTIME in the last 168 hours. Cardiac Enzymes: No results for input(s): CKTOTAL, CKMB, CKMBINDEX, TROPONINI in the last 168 hours. BNP (last 3 results) No results for input(s): PROBNP in the last 8760 hours. HbA1C: No results for input(s): HGBA1C in the last 72 hours. CBG: Recent Labs  Lab 05/30/19 0818 05/30/19 1145 05/30/19 2248 05/31/19 0754 05/31/19 1249  GLUCAP 141* 136* 174* 138* 113*   Lipid Profile: No results for input(s): CHOL, HDL, LDLCALC, TRIG, CHOLHDL, LDLDIRECT in the last 72 hours. Thyroid Function Tests: No results for input(s): TSH, T4TOTAL, FREET4, T3FREE, THYROIDAB in the last 72 hours. Anemia Panel: No results for input(s): VITAMINB12, FOLATE, FERRITIN, TIBC, IRON, RETICCTPCT in the last 72 hours. Sepsis Labs: No results for input(s):  PROCALCITON, LATICACIDVEN in the last 168 hours.  Recent Results (from the past 240 hour(s))  SARS Coronavirus 2 [facility] order, Performed in [facility] hospital lab) Nasopharyngeal Nasopharyngeal Swab     Status: Abnormal   Collection Time: 05/24/19  4:50 AM   Specimen: Nasopharyngeal Swab  Result Value Ref Range Status   SARS Coronavirus 2 POSITIVE (A) NEGATIVE Final    Comment: RESULT CALLED TO, READ BACK BY AND VERIFIED WITH: JENNIFER HUFF, RN [id number] @ [id number] BY J SCOTTON (NOTE) If result is NEGATIVE SARS-CoV-2 target nucleic acids are NOT DETECTED. The SARS-CoV-2 RNA is generally detectable in upper and lower  respiratory specimens during the acute phase of infection. The lowest  concentration of SARS-CoV-2 viral copies this assay can detect is 250  copies / mL. A negative result does not preclude SARS-CoV-2 infection  and should not be used as the sole basis for treatment or other  patient management decisions.  A negative result may occur with  improper specimen collection / handling, submission of specimen other  than nasopharyngeal swab, presence of viral mutation(s) within the  areas targeted by this assay, and inadequate number  of viral copies  (<250 copies / mL). A negative result must be combined with clinical  observations, patient history, and epidemiological information. If result is POSITIVE SARS-CoV-2 target nucleic acids are DET ECTED. The SARS-CoV-2 RNA is generally detectable in upper and lower  respiratory specimens during the acute phase of infection.  Positive  results are indicative of active infection with SARS-CoV-2.  Clinical  correlation with patient history and other diagnostic information is  necessary to determine patient infection status.  Positive results do  not rule out bacterial infection or co-infection with other viruses. If result is PRESUMPTIVE POSTIVE SARS-CoV-2 nucleic acids MAY BE PRESENT.   A presumptive positive result was obtained on  the submitted specimen  and confirmed on repeat testing.  While 2019 novel coronavirus  (SARS-CoV-2) nucleic acids may be present in the submitted sample  additional confirmatory testing may be necessary for epidemiological  and / or clinical management purposes  to differentiate between  SARS-CoV-2 and other Sarbecovirus currently known to infect humans.  If clinically indicated additional testing with an alternate test  methodology ([place]) is advised. The SARS-CoV-2 RNA is generally  detectable in upper and lower respiratory specimens during the acute  phase of infection. The expected result is Negative. Fact Sheet for Patients:  StrictlyIdeas.no Fact Sheet for Healthcare Providers: BankingDealers.co.za This test is not yet approved or cleared by the [place] FDA and has been authorized for detection and/or diagnosis of SARS-CoV-2 by FDA under an Emergency Use Authorization (EUA).  This EUA will remain in effect (meaning this test can be used) for the duration of the COVID-19 declaration under Section 564(b)(1) of the Act, 21 U.S.C. section 360bbb-3(b)(1), unless the authorization is terminated or revoked sooner. Performed at [facility], [place] [address] [place], [place] [address]          Radiology Studies: Dg Lumbar Spine 1 View  Result Date: 05/30/2019 CLINICAL DATA:  L3-4 decompressive laminectomy. Intraoperative localization. EXAM: PORTABLE INTRAOPERATIVE LUMBAR SPINE - 1 VIEW COMPARISON:  05/23/2019. FINDINGS: Image obtained at 4:44 p.m. and submitted for interpretation postoperatively demonstrates the localizer device POSTERIOR to the L3 vertebral body. IMPRESSION: L3 localized intraoperatively. Electronically Signed   By: Evangeline Dakin M.D.   On: 05/30/2019 19:05        Scheduled Meds: . allopurinol  100 mg Oral Daily  . aspirin EC  81 mg Oral Daily  . buPROPion  200 mg Oral Daily  .  cephALEXin  500 mg Oral Q12H  . docusate sodium  100 mg Oral BID  . DULoxetine  30 mg Oral BID  . gabapentin  300 mg Oral TID  . insulin aspart  0-15 Units Subcutaneous TID WC  . insulin aspart protamine- aspart  8 Units Subcutaneous BID WC  . levothyroxine  88 mcg Oral QAC breakfast  . loratadine  10 mg Oral QHS  . magnesium oxide  200 mg Oral Daily  . pantoprazole  40 mg Oral QHS  . rosuvastatin  5 mg Oral Daily  . sodium chloride flush  3 mL Intravenous Q12H  . vitamin B-12  500 mcg Oral BID   Continuous Infusions: . sodium chloride 250 mL (05/30/19 2342)  . methocarbamol (ROBAXIN) IV 500 mg (05/29/19 1637)     LOS: 7 days     Cordelia Poche, MD [facility] 05/31/2019, 3:31 PM  If 7PM-7AM, please contact night-coverage [email or web address]

## 2019-05-31 NOTE — Progress Notes (Signed)
Physical Therapy Treatment/ Re-eval Patient Details Name: Alice Kemp MRN: [id number] DOB: [birth year] Today's Date: 05/31/2019    History of Present Illness 68 y.o. female with PMH - anxiety, depression, OA, chronic back pain, cervical radiculopathy, ckd, dm, chf, glaucoma, gout, hypothyroidism, morbid obesity, OSA, pericardial effusion, pulmonary htn, presented with progressively worse lower back pain with worsening lower extremity weakness, generalized weakness for several days, multiple episodes of emesis, decreased oral intake, worsening right-sided lower abdomen wounds.  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    PT Comments    Pt now s/p back surgery. Pt continues to demonstrate very weak LE's bilaterally. Will need extended rehab. Was confused today which may be due to meds given earlier.    Follow Up Recommendations  SNF     Equipment Recommendations  Other (comment)(TBD, if home, may need w/c)    Recommendations for Other Services       Precautions / Restrictions Precautions Precautions: Fall;Back Precaution Booklet Issued: No Restrictions Weight Bearing Restrictions: Yes    Mobility  Bed Mobility Overal bed mobility: Needs Assistance Bed Mobility: Sidelying to Sit;Sit to Sidelying;Rolling Rolling: +2 for physical assistance;Max assist Sidelying to sit: +2 for physical assistance;Total assist     Sit to sidelying: +2 for physical assistance;Total assist General bed mobility comments: Assist for all aspects of bed mobility.   Transfers                 General transfer comment: Unable to safely attempt due to profound LE  weakness.   Ambulation/Gait                 Stairs             Wheelchair Mobility    Modified Rankin (Stroke Patients Only)       Balance Overall balance assessment: Needs assistance;History of Falls Sitting-balance support: Feet supported;Bilateral upper extremity supported Sitting balance-Leahy Scale: Poor Sitting balance - Comments: Pt sat EOB x 7-8 minutes with initial mod assist progressing to min guard.  Postural control: Posterior lean                                  Cognition Arousal/Alertness: Awake/alert Behavior During Therapy: Flat affect Overall Cognitive Status: Impaired/Different from baseline Area of Impairment: Orientation;Attention;Memory;Following commands;Problem solving                 Orientation Level: Disoriented to;Place Current Attention Level: Sustained Memory: Decreased short-term memory Following Commands: Follows one step commands with increased time     Problem Solving: Slow processing;Decreased initiation;Difficulty sequencing;Requires verbal cues;Requires tactile cues General Comments: Pt given flexeril earlier in the day. Since that time pt has been sleepy per nursing. Suspect cognition is possible impaired due to this.      Exercises      General Comments General comments (skin integrity, edema, etc.): RLE strength - 2/5. LLE strength 1/5 in hip/knee and 0/5 in ankle.      Pertinent Vitals/Pain Pain Assessment: 0-10 Pain Score: 10-Worst pain ever Pain Location: back and shoulders Pain Intervention(s): Limited activity within patient's tolerance;Premedicated before session;Monitored during session;Repositioned    Home Living  Prior Function            PT Goals (current goals can now be found in the care plan section) Acute Rehab PT Goals PT Goal Formulation: With patient Time For Goal Achievement: 06/14/19 Potential to Achieve Goals: Fair Progress towards PT  goals: Not progressing toward goals - comment;Goals downgraded-see care plan    Frequency    Min 3X/week      PT Plan Current plan remains appropriate;Frequency needs to be updated    Co-evaluation PT/OT/SLP Co-Evaluation/Treatment: Yes Reason for Co-Treatment: For patient/therapist safety PT goals addressed during session: Mobility/safety with mobility;Balance        AM-PAC PT "6 Clicks" Mobility   Outcome Measure  Help needed turning from your back to your side while in a flat bed without using bedrails?: A Lot Help needed moving from lying on your back to sitting on the side of a flat bed without using bedrails?: A Lot Help needed moving to and from a bed to a chair (including a wheelchair)?: Total Help needed standing up from a chair using your arms (e.g., wheelchair or bedside chair)?: Total Help needed to walk in hospital room?: Total Help needed climbing 3-5 steps with a railing? : Total 6 Click Score: 8    End of Session Equipment Utilized During Treatment: Oxygen(O2 placed at end of session due to SpO2 86% in supine on RA) Activity Tolerance: Patient limited by pain Patient left: in bed;with call bell/phone within reach;with bed alarm set Nurse Communication: Mobility status;Patient requests pain meds PT Visit Diagnosis: Other abnormalities of gait and mobility (R26.89);Repeated falls (R29.6)     Time: 1342-1410 PT Time Calculation (min) (ACUTE ONLY): 28 min  Charges:                        [place] Pager [phone] Office [place] 05/31/2019, 2:47 PM

## 2019-05-31 NOTE — Evaluation (Signed)
Occupational Therapy Evaluation Patient Details Name: Alice Kemp MRN: [id number] DOB: 10-31-1950 Today's Date: 05/31/2019    History of Present Illness 68 y.o. female with PMH - anxiety, depression, OA, chronic back pain, cervical radiculopathy, ckd, dm, chf, glaucoma, gout, hypothyroidism, morbid obesity, OSA, pericardial effusion, pulmonary htn, presented with progressively worse lower back pain with worsening lower extremity weakness, generalized weakness for several days, multiple episodes of emesis, decreased oral intake, worsening right-sided lower abdomen wounds.  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   Clinical Impression   This 68 yo female admitted with above presents to acute OT with increased pain, decreased balance, decreased mobility all affecting her safety and independence with basic ADLs. She will benefit from acute OT with follow up at SNF to work back towards PLOF.    Follow Up Recommendations  SNF;Supervision/Assistance - 24 hour    Equipment Recommendations  Other (comment)(TBD next venue)       Precautions / Restrictions Precautions Precautions: Fall;Back Precaution Booklet Issued: No Restrictions Weight Bearing Restrictions: Yes      Mobility Bed Mobility Overal bed mobility: Needs Assistance Bed Mobility: Sidelying to Sit;Sit to Sidelying;Rolling Rolling: +2 for physical assistance;Max assist Sidelying to sit: +2 for physical assistance;Total assist     Sit to sidelying: +2 for physical assistance;Total assist General bed mobility comments: Assist for all aspects of bed mobility.   Transfers                  General transfer comment: Unable to safely attempt due to profound LE weakness.     Balance Overall balance assessment: Needs assistance;History of Falls Sitting-balance support: Feet supported;Bilateral upper extremity supported Sitting balance-Leahy Scale: Poor Sitting balance - Comments: Pt sat EOB x 7-8 minutes with initial mod assist progressing to min guard.  Postural control: Posterior lean                                 ADL either performed or assessed with clinical judgement   ADL Overall ADL's : Needs assistance/impaired Eating/Feeding: Minimal assistance;Bed level   Grooming: Wash/dry face;Min guard;Sitting   Upper Body Bathing: Total assistance;Sitting Upper Body Bathing Details (indicate cue type and reason): EOB Lower Body Bathing: Total assistance;Bed level   Upper Body Dressing : Total assistance Upper Body Dressing Details (indicate cue type and reason): sitting EOB Lower Body Dressing: Total assistance;Bed level                       Vision Patient Visual Report: No change from baseline              Pertinent Vitals/Pain Pain Assessment: 0-10 Pain Score: 10-Worst pain ever Pain Location: back and shoulders Pain Descriptors / Indicators: Guarding;Sore;Shooting Pain Intervention(s): Limited activity within patient's tolerance;Premedicated before session;Monitored during session;Repositioned     Hand Dominance  right   Extremity/Trunk Assessment Upper Extremity Assessment Upper Extremity Assessment: LUE deficits/detail LUE Deficits / Details: Decreased AROM prior to admission--old shoulder sx              Cognition Arousal/Alertness: Awake/alert Behavior During Therapy: Flat affect Overall Cognitive Status: Impaired/Different from  baseline Area of Impairment: Orientation;Attention;Memory;Following commands;Problem solving                 Orientation Level: Disoriented to;Place Current Attention Level:  Sustained Memory: Decreased short-term memory Following Commands: Follows one step commands with increased time     Problem Solving: Slow processing;Decreased initiation;Difficulty sequencing;Requires verbal cues;Requires tactile cues General Comments: Pt given flexeril earlier in the day. Since that time pt has been sleepy per nursing. Suspect cognition is possible impaired due to this.   General Comments  RLE strength - 2/5. LLE strength 1/5 in hip/knee and 0/5 in ankle.                        OT Problem List: Decreased strength;Decreased range of motion;Decreased activity tolerance;Impaired balance (sitting and/or standing);Impaired UE functional use;Pain      OT Treatment/Interventions: Self-care/ADL training;DME and/or AE instruction;Patient/family education;Balance training    OT Goals(Current goals can be found in the care plan section) Acute Rehab OT Goals OT Goal Formulation: With patient Time For Goal Achievement: 06/14/19 Potential to Achieve Goals: Fair  OT Frequency: Min 2X/week           Co-evaluation PT/OT/SLP Co-Evaluation/Treatment: Yes Reason for Co-Treatment: For patient/therapist safety PT goals addressed during session: Mobility/safety with mobility;Balance OT goals addressed during session: Strengthening/ROM      AM-PAC OT "6 Clicks" Daily Activity     Outcome Measure Help from another person eating meals?: A Little Help from another person taking care of personal grooming?: A Lot Help from another person toileting, which includes using toliet, bedpan, or urinal?: Total Help from another person bathing (including washing, rinsing, drying)?: A Lot Help from another person to put on and taking off regular upper body clothing?: A Lot Help from another person to put on and taking off regular lower body clothing?: Total 6 Click Score: 11   End of Session Nurse Communication: Mobility status  Activity Tolerance: (limited by jerking, pain, and  fatigue) Patient left: in bed;with call bell/phone within reach;with bed alarm set  OT Visit Diagnosis: Other abnormalities of gait and mobility (R26.89);Muscle weakness (generalized) (M62.81);Pain;Other symptoms and signs involving cognitive function Pain - part of body: (shoulder and back)                Time: 1342-1410 OT Time Calculation (min): 28 min Charges:  OT General Charges $OT Visit: 1 Visit OT Evaluation $OT Eval Moderate Complexity: Keddie, OTR/L Acute [employer] Pager [phone] Office [phone]    Almon Register 05/31/2019, 3:11 PM

## 2019-06-01 DIAGNOSIS — I5032 Chronic diastolic (congestive) heart failure: Secondary | ICD-10-CM

## 2019-06-01 DIAGNOSIS — M5126 Other intervertebral disc displacement, lumbar region: Secondary | ICD-10-CM

## 2019-06-01 LAB — CBC
HCT: 24.5 % — ABNORMAL LOW (ref 36.0–46.0)
Hemoglobin: 7.8 g/dL — ABNORMAL LOW (ref 12.0–15.0)
MCH: 29.8 pg (ref 26.0–34.0)
MCHC: 31.8 g/dL (ref 30.0–36.0)
MCV: 93.5 fL (ref 80.0–100.0)
Platelets: 304 10*3/uL (ref 150–400)
RBC: 2.62 MIL/uL — ABNORMAL LOW (ref 3.87–5.11)
RDW: 14.6 % (ref 11.5–15.5)
WBC: 12.2 10*3/uL — ABNORMAL HIGH (ref 4.0–10.5)
nRBC: 0.2 % (ref 0.0–0.2)

## 2019-06-01 LAB — GLUCOSE, CAPILLARY
Glucose-Capillary: 160 mg/dL — ABNORMAL HIGH (ref 70–99)
Glucose-Capillary: 201 mg/dL — ABNORMAL HIGH (ref 70–99)
Glucose-Capillary: 142 mg/dL — ABNORMAL HIGH (ref 70–99)
Glucose-Capillary: 176 mg/dL — ABNORMAL HIGH (ref 70–99)

## 2019-06-01 LAB — BASIC METABOLIC PANEL
Anion gap: 8 (ref 5–15)
BUN: 27 mg/dL — ABNORMAL HIGH (ref 8–23)
CO2: 23 mmol/L (ref 22–32)
Calcium: 8.6 mg/dL — ABNORMAL LOW (ref 8.9–10.3)
Chloride: 101 mmol/L (ref 98–111)
Creatinine, Ser: 1.5 mg/dL — ABNORMAL HIGH (ref 0.44–1.00)
GFR calc Af Amer: 41 mL/min — ABNORMAL LOW (ref >60)
GFR calc non Af Amer: 35 mL/min — ABNORMAL LOW (ref >60)
Glucose, Bld: 173 mg/dL — ABNORMAL HIGH (ref 70–99)
Potassium: 4.7 mmol/L (ref 3.5–5.1)
Sodium: 132 mmol/L — ABNORMAL LOW (ref 135–145)

## 2019-06-01 LAB — SEDIMENTATION RATE: Sed Rate: 140 mm/hr — ABNORMAL HIGH (ref 0–22)

## 2019-06-01 LAB — FERRITIN: Ferritin: 86 ng/mL (ref 11–307)

## 2019-06-01 LAB — LACTATE DEHYDROGENASE: LDH: 119 U/L (ref 98–192)

## 2019-06-01 LAB — D-DIMER, QUANTITATIVE: D-Dimer, Quant: 4.01 ug/mL-FEU — ABNORMAL HIGH (ref 0.00–0.50)

## 2019-06-01 NOTE — Progress Notes (Signed)
PROGRESS NOTE    Alice Kemp  QJJ:[id number] DOB: [birth year] DOA: 05/24/2019 PCP: [name], MD   Brief Narrative:  Patient is a 68 year old morbidly obese Caucasian female with a past medical history significant for but not limited to anxiety and depression, osteoarthritis, chronic back pain, benign colonic neoplasm, history of left breast abscess, cervical radiculopathy, CKD stage III, constipation, type 2 diabetes mellitus, chronic diastolic CHF, glaucoma, history of gout, hypertension, hyperlipidemia, hypothyroidism, morbid obesity, obstructive sleep apnea, history of pericardial effusion and pulmonary hypertension, vitamin B12 and vitamin D deficiency as well as other comorbidities who presented with worsening low back pain and also found to have abdominal cellulitis.  She is tested positive for COVID but was diagnosed back in June.  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  PT OT evaluated and recommending skilled nursing facility.  Assessment & Plan:   Principal Problem:   Cellulitis of abdominal wall Active Problems:   Uncontrolled type 2 diabetes mellitus with stage 3 chronic kidney disease (HCC)   Morbid obesity (HCC)   Hypothyroidism   Chronic diastolic heart failure (HCC)   Essential hypertension   AKI (acute kidney injury) (HCC)   Hyponatremia   Anemia   HNP (herniated nucleus pulposus), lumbar  Lower back pain with radiculopathy -Associated LE weakness. 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  -S/p lumbar laminectomy and microdiscectomy on 8/24 -Appreciate further Neurosurgery recommendations and evaluation -Continue with acetaminophen 650 mg p.o./RC every 4 PRN for mild pain, methocarbamol 500 mg p.o. every 8 as needed for muscle spasms with oxycodone 10 mg every 6 as  needed severe pain -Continue with supportive care and bowel regimen docusate 100 g p.o. twice daily, as well as antiemetics with ondansetron 4 mg p.o./IV every 6 as needed for nausea and vomiting  Abdominal Wound/Cellulitis -Improving on antibiotics. Empirically treated with ceftriaxone and transitioned to Keflex -Continue Cephalexin every 12h with an end date 8/27  Lethargy -Secondary to combination of dilaudid and flexeril -Discontinue dilaudid -C/w Pulse oximetry, if worsens, will obtain ABG/give Narcan  Chronic Diastolic Heart Failure -Appears Euvolemic and has no Signs of Decompensation -Continue Holding Diuretics and Antihypertensives -Strict I's/O's and Daily Weights -Continue to monitor for signs and symptoms of volume overload  Diabetes Mellitus Type 2 -Continue Novolog Mix 70/30 8 units sq BIDwm and with Moderate Novolog SSI AC -HbA1c was 8.9 -CBG's ranging from 113-201  Hypothyroidism -TSH was 0.819 -Continue Levothyroxine 88 mcg po Daily   Normocytic Anemia/Anemia of Chronic Kidney Disease  Unsure of etiology. Possibly secondary to kidney disease. Iron panel obtained and is normal. No obvious source of bleeding -CBC as needed  Hyponatremia -Thought to be secondary to diuretic use and GI loses so she was Given IV fluids. -Na+ had improved but is now 132 and Mild -Continue to Monitor and Trend -Repeat CMP in AM   Essential Hypertension -Stable. Continuing to Hold Diuretics and ARB -Last blood pressure is 112/61  Hyperlipidemia -Continue Rosuvastatin 5 mg po Daily   GERD/GI Prophylaxis -C/w Pantoprazole 40 mg po qhS  COVID-19 Positive -Patient has been asymptomatic for weeks. Currently on precautions -Has been on room air without hypoxia or respiratory symptoms -Had exposure back in June and I declined testing at that time. -Currently has no active symptoms -Inflammatory markers are elevated and last CRP was 14.9 on  05/28/2019 and was not repeated  this morning -LDH was 119, ferritin level was 86, ESR this morning was 140, and D-Dimer was 4.01 and trending up slowly -Continue to monitor and trend inflammatory markers  Morbid Obesity -Estimated body mass index is 43.9 kg/m as calculated from the following:   Height as of this encounter: '5\' 4"'$  (1.626 m).   Weight as of this encounter: 116 kg. -Weight Loss and Dietary Counseling given   AKI on CKD Stage 3 -Improved. BUN/Cr is improved to 27/1.50 -Continue to Monitor Strict I's/O's -Nephrotoxic medications, contrast dyes, hypotension if possible -Continue monitor and trend renal function  -IV fluid hydration is now stopped -Repeat CMP in the a.m.  Anxiety and Depression -Continue with Amitriptyline 10 mg p.o. nightly as needed for sleep, Bupropion 200 mg p.o. daily  Gout -Continue with Allopurinol 100 mg p.o. daily  DVT prophylaxis: SCDs Code Status: FULL CODE Family Communication: No family present at bedside Disposition Plan: SNF  Consultants:   Neurosurgery Dr. Kary Kos   Procedures:  Complete decompressive laminectomy L3 partial inferior laminectomy L2.  With microscopic discectomy from the left and foraminotomies of the left L3 nerve root.   Antimicrobials:  Anti-infectives (From admission, onward)   Start     Dose/Rate Route Frequency Ordered Stop   05/31/19 0000  ceFAZolin (ANCEF) IVPB 2g/100 mL premix     2 g 200 mL/hr over 30 Minutes Intravenous Every 8 hours 05/30/19 2312 05/31/19 0845   05/30/19 1657  bacitracin 50,000 Units in sodium chloride 0.9 % 500 mL irrigation  Status:  Discontinued       As needed 05/30/19 1658 05/30/19 1840   05/26/19 2200  cephALEXin (KEFLEX) capsule 500 mg     500 mg Oral Every 12 hours 05/26/19 1618     05/24/19 2000  meropenem (MERREM) 1 g in sodium chloride 0.9 % 100 mL IVPB  Status:  Discontinued     1 g 200 mL/hr over 30 Minutes Intravenous Every 12 hours 05/24/19 0611 05/24/19 1303   05/24/19 1600  cefTRIAXone  (ROCEPHIN) 2 g in sodium chloride 0.9 % 100 mL IVPB  Status:  Discontinued     2 g 200 mL/hr over 30 Minutes Intravenous Every 24 hours 05/24/19 1303 05/26/19 1618   05/24/19 0630  vancomycin (VANCOCIN) 2,000 mg in sodium chloride 0.9 % 500 mL IVPB     2,000 mg 250 mL/hr over 120 Minutes Intravenous  Once 05/24/19 0554 05/24/19 0859   05/24/19 0611  vancomycin variable dose per unstable renal function (pharmacist dosing)  Status:  Discontinued      Does not apply See admin instructions 05/24/19 0611 05/24/19 1303   05/24/19 0600  meropenem (MERREM) 1 g in sodium chloride 0.9 % 100 mL IVPB     1 g 200 mL/hr over 30 Minutes Intravenous  Once 05/24/19 0545 05/24/19 0640     Subjective: Seen and examined at bedside and she was still complaining of some pain and states that she did not feel well.  No nausea or vomiting.  No lightheadedness or dizziness.  No other concerns or complaints at this time and denies any shortness of breath.  Objective: Vitals:   05/31/19 1603 05/31/19 1942 06/01/19 0755 06/01/19 1139  BP: (!) 102/53 (!) 107/57 (!) 134/58 112/61  Pulse:  90 93 92  Resp: (!) 24 (!) 22 (!) 24 20  Temp: 100 F (37.8 C) 99.5 F (37.5 C) 99 F (37.2 C) 97.8 F (36.6 C)  TempSrc: Oral  Oral Oral Oral  SpO2: 95% 95% 94% 93%  Weight:      Height:        Intake/Output Summary (Last 24 hours) at 06/01/2019 1325 Last data filed at 06/01/2019 0900 Gross per 24 hour  Intake 360 ml  Output 1480 ml  Net -1120 ml   Filed Weights   05/25/19 0435 05/29/19 0606 05/30/19 0500  Weight: 116.2 kg 117.3 kg 116 kg   Examination: Physical Exam:  Constitutional: WN/WD morbidly obese Caucasian female in NAD and appears calm and comfortable Eyes: Lids and conjunctivae normal, sclerae anicteric  ENMT: External Ears, Nose appear normal. Grossly normal hearing. Mucous membranes are moist.  Neck: Appears normal, supple, no cervical masses, normal ROM, no appreciable thyromegaly; no JVD  Respiratory: Diminished to auscultation bilaterally, no wheezing, rales, rhonchi or crackles. Normal respiratory effort and patient is not tachypenic. No accessory muscle use.  Cardiovascular: RRR, no murmurs / rubs / gallops. S1 and S2 auscultated. No extremity edema.  Abdomen: Soft, non-tender, Distended 2/2 to body habitus. Bowel sounds positive x4.  GU: Deferred. Musculoskeletal: No clubbing / cyanosis of digits/nails. No joint deformity upper and lower extremities. Skin: No rashes, lesions, ulcers on a limited skin evaluation. No induration; Warm and dry.  Neurologic: CN 2-12 grossly intact with no focal deficits. Romberg sign and cerebellar reflexes not assessed.  Psychiatric: Normal judgment and insight. Alert and oriented x 3. Normal mood and appropriate affect.   Data Reviewed: I have personally reviewed following labs and imaging studies  CBC: Recent Labs  Lab 05/26/19 0429 05/28/19 0854 05/29/19 0546 06/01/19 0538  WBC 11.1* 8.4 9.5 12.2*  NEUTROABS 6.1 4.6 4.3  --   HGB 8.6* 8.3* 8.4* 7.8*  HCT 28.2* 26.9* 27.0* 24.5*  MCV 97.6 95.4 96.1 93.5  PLT 225 244 271 161   Basic Metabolic Panel: Recent Labs  Lab 05/26/19 0429 05/28/19 0854 05/29/19 0546 06/01/19 0538  NA 131* 135 137 132*  K 5.1 5.1 4.9 4.7  CL 101 105 105 101  CO2 19* 20* 23 23  GLUCOSE 168* 147* 130* 173*  BUN 32* 29* 32* 27*  CREATININE 1.97* 1.47* 1.52* 1.50*  CALCIUM 8.2* 8.7* 8.9 8.6*  MG 1.8 1.7 1.9  --   PHOS 4.0 4.0 4.7*  --    GFR: Estimated Creatinine Clearance: 44.9 mL/min (A) (by C-G formula based on SCr of 1.5 mg/dL (H)). Liver Function Tests: Recent Labs  Lab 05/26/19 0429 05/28/19 0854 05/29/19 0546  ALBUMIN 2.5* 2.3* 2.3*   No results for input(s): LIPASE, AMYLASE in the last 168 hours. No results for input(s): AMMONIA in the last 168 hours. Coagulation Profile: No results for input(s): INR, PROTIME in the last 168 hours. Cardiac Enzymes: No results for input(s):  CKTOTAL, CKMB, CKMBINDEX, TROPONINI in the last 168 hours. BNP (last 3 results) No results for input(s): PROBNP in the last 8760 hours. HbA1C: No results for input(s): HGBA1C in the last 72 hours. CBG: Recent Labs  Lab 05/31/19 1249 05/31/19 1600 05/31/19 1945 06/01/19 0749 06/01/19 1125  GLUCAP 113* 143* 148* 160* 201*   Lipid Profile: No results for input(s): CHOL, HDL, LDLCALC, TRIG, CHOLHDL, LDLDIRECT in the last 72 hours. Thyroid Function Tests: No results for input(s): TSH, T4TOTAL, FREET4, T3FREE, THYROIDAB in the last 72 hours. Anemia Panel: Recent Labs    06/01/19 0858  FERRITIN 86   Sepsis Labs: No results for input(s): PROCALCITON, LATICACIDVEN in the last 168 hours.  Recent Results (from the past 240 hour(s))  SARS Coronavirus 2 [facility] order, Performed in [facility] hospital lab) Nasopharyngeal Nasopharyngeal Swab     Status: Abnormal   Collection Time: 05/24/19  4:50 AM   Specimen: Nasopharyngeal Swab  Result Value Ref Range Status   SARS Coronavirus 2 POSITIVE (A) NEGATIVE Final    Comment: RESULT CALLED TO, READ BACK BY AND VERIFIED WITH: JENNIFER HUFF, RN [id number] @ 1610 BY J SCOTTON (NOTE) If result is NEGATIVE SARS-CoV-2 target nucleic acids are NOT DETECTED. The SARS-CoV-2 RNA is generally detectable in upper and lower  respiratory specimens during the acute phase of infection. The lowest  concentration of SARS-CoV-2 viral copies this assay can detect is 250  copies / mL. A negative result does not preclude SARS-CoV-2 infection  and should not be used as the sole basis for treatment or other  patient management decisions.  A negative result may occur with  improper specimen collection / handling, submission of specimen other  than nasopharyngeal swab, presence of viral mutation(s) within the  areas targeted by this assay, and inadequate number of viral copies  (<250 copies / mL). A negative result must be combined with clinical  observations,  patient history, and epidemiological information. If result is POSITIVE SARS-CoV-2 target nucleic acids are DET ECTED. The SARS-CoV-2 RNA is generally detectable in upper and lower  respiratory specimens during the acute phase of infection.  Positive  results are indicative of active infection with SARS-CoV-2.  Clinical  correlation with patient history and other diagnostic information is  necessary to determine patient infection status.  Positive results do  not rule out bacterial infection or co-infection with other viruses. If result is PRESUMPTIVE POSTIVE SARS-CoV-2 nucleic acids MAY BE PRESENT.   A presumptive positive result was obtained on the submitted specimen  and confirmed on repeat testing.  While 2019 novel coronavirus  (SARS-CoV-2) nucleic acids may be present in the submitted sample  additional confirmatory testing may be necessary for epidemiological  and / or clinical management purposes  to differentiate between  SARS-CoV-2 and other Sarbecovirus currently known to infect humans.  If clinically indicated additional testing with an alternate test  methodology ([place]) is advised. The SARS-CoV-2 RNA is generally  detectable in upper and lower respiratory specimens during the acute  phase of infection. The expected result is Negative. Fact Sheet for Patients:  StrictlyIdeas.no Fact Sheet for Healthcare Providers: BankingDealers.co.za This test is not yet approved or cleared by the [place] FDA and has been authorized for detection and/or diagnosis of SARS-CoV-2 by FDA under an Emergency Use Authorization (EUA).  This EUA will remain in effect (meaning this test can be used) for the duration of the COVID-19 declaration under Section 564(b)(1) of the Act, 21 U.S.C. section 360bbb-3(b)(1), unless the authorization is terminated or revoked sooner. Performed at [facility], [place] [address],  [place], [place] [address]     Radiology Studies: Dg Lumbar Spine 1 View  Result Date: 05/30/2019 CLINICAL DATA:  L3-4 decompressive laminectomy. Intraoperative localization. EXAM: PORTABLE INTRAOPERATIVE LUMBAR SPINE - 1 VIEW COMPARISON:  05/23/2019. FINDINGS: Image obtained at 4:44 p.m. and submitted for interpretation postoperatively demonstrates the localizer device POSTERIOR to the L3 vertebral body. IMPRESSION: L3 localized intraoperatively. Electronically Signed   By: Evangeline Dakin M.D.   On: 05/30/2019 19:05   Scheduled Meds: . allopurinol  100 mg Oral Daily  . aspirin EC  81 mg Oral Daily  . buPROPion  200 mg Oral Daily  . cephALEXin  500 mg Oral Q12H  .  docusate sodium  100 mg Oral BID  . DULoxetine  30 mg Oral BID  . gabapentin  300 mg Oral TID  . insulin aspart  0-15 Units Subcutaneous TID WC  . insulin aspart protamine- aspart  8 Units Subcutaneous BID WC  . levothyroxine  88 mcg Oral QAC breakfast  . loratadine  10 mg Oral QHS  . magnesium oxide  200 mg Oral Daily  . pantoprazole  40 mg Oral QHS  . rosuvastatin  5 mg Oral Daily  . sodium chloride flush  3 mL Intravenous Q12H  . vitamin B-12  500 mcg Oral BID   Continuous Infusions: . sodium chloride 250 mL (05/30/19 2342)    LOS: 8 days   Kerney Elbe, DO [facility] PAGER is on AMION  If 7PM-7AM, please contact night-coverage [email or web address] Password [email or web address] 06/01/2019, 1:25 PM

## 2019-06-01 NOTE — Anesthesia Postprocedure Evaluation (Signed)
Anesthesia Post Note  Patient: Alice Kemp  Procedure(s) Performed: Left Decompressive Lumbar Laminectomy and Microdiscectomy lumbar three-four (Left Back)     Anesthesia Post Evaluation  Last Vitals:  Vitals:   06/01/19 0755 06/01/19 1139  BP: (!) 134/58 112/61  Pulse: 93 92  Resp: (!) 24 20  Temp: 37.2 C 36.6 C  SpO2: 94% 93%    Last Pain:  Vitals:   06/01/19 1139  TempSrc: Oral  PainSc:                  Alice Kemp

## 2019-06-01 NOTE — Progress Notes (Signed)
Patient oxygen dropped to 88% after receiving Robaxin. MD notified and pt placed on 0.5 lpm oxygen. Saturations now at 93% via [place]. VS stable at this time, will continue to monitor.

## 2019-06-01 NOTE — Progress Notes (Signed)
Subjective: Patient reports Still complaining of back pain leg pain overall I think her leg pain somewhat better  Objective: Vital signs in last 24 hours: Temp:  [97.8 F (36.6 C)-99.5 F (37.5 C)] 97.8 F (36.6 C) (08/26 1139) Pulse Rate:  [90-93] 92 (08/26 1139) Resp:  [20-24] 20 (08/26 1139) BP: (107-134)/(57-61) 112/61 (08/26 1139) SpO2:  [93 %-95 %] 93 % (08/26 1139)  Intake/Output from previous day: 08/25 0701 - 08/26 0700 In: 600 [P.O.:600] Out: 1230 [Urine:1200; Drains:30] Intake/Output this shift: Total I/O In: 0  Out: 400 [Urine:400]  Essentially neurologically unchanged I do think she has stronger dorsiflexion of the right foot still with left complete foot drop and left greater than right lower extremity weakness  Lab Results: Recent Labs    06/01/19 0538  WBC 12.2*  HGB 7.8*  HCT 24.5*  PLT 304   BMET Recent Labs    06/01/19 0538  NA 132*  K 4.7  CL 101  CO2 23  GLUCOSE 173*  BUN 27*  CREATININE 1.50*  CALCIUM 8.6*    Studies/Results: Dg Lumbar Spine 1 View  Result Date: 05/30/2019 CLINICAL DATA:  L3-4 decompressive laminectomy. Intraoperative localization. EXAM: PORTABLE INTRAOPERATIVE LUMBAR SPINE - 1 VIEW COMPARISON:  05/23/2019. FINDINGS: Image obtained at 4:44 p.m. and submitted for interpretation postoperatively demonstrates the localizer device POSTERIOR to the L3 vertebral body. IMPRESSION: L3 localized intraoperatively. Electronically Signed   By: Evangeline Dakin M.D.   On: 05/30/2019 19:05    Assessment/Plan: Postop day 2 decompressive lumbar laminectomy and discectomy there is some motor improvement of the right lower extremity not much change in left.  Took out her drain bandage looks good patient can be transferred to rehab whenever medically cleared  LOS: 8 days     Alice Kemp P 06/01/2019, 4:31 PM

## 2019-06-02 ENCOUNTER — Inpatient Hospital Stay (HOSPITAL_COMMUNITY): Payer: PPO

## 2019-06-02 LAB — LACTATE DEHYDROGENASE: LDH: 102 U/L (ref 98–192)

## 2019-06-02 LAB — COMPREHENSIVE METABOLIC PANEL
ALT: 11 U/L (ref 0–44)
AST: 18 U/L (ref 15–41)
Albumin: 1.8 g/dL — ABNORMAL LOW (ref 3.5–5.0)
Alkaline Phosphatase: 74 U/L (ref 38–126)
Anion gap: 11 (ref 5–15)
BUN: 25 mg/dL — ABNORMAL HIGH (ref 8–23)
CO2: 25 mmol/L (ref 22–32)
Calcium: 8.6 mg/dL — ABNORMAL LOW (ref 8.9–10.3)
Chloride: 97 mmol/L — ABNORMAL LOW (ref 98–111)
Creatinine, Ser: 1.43 mg/dL — ABNORMAL HIGH (ref 0.44–1.00)
GFR calc Af Amer: 44 mL/min — ABNORMAL LOW (ref >60)
GFR calc non Af Amer: 38 mL/min — ABNORMAL LOW (ref >60)
Glucose, Bld: 186 mg/dL — ABNORMAL HIGH (ref 70–99)
Potassium: 4.6 mmol/L (ref 3.5–5.1)
Sodium: 133 mmol/L — ABNORMAL LOW (ref 135–145)
Total Bilirubin: 0.6 mg/dL (ref 0.3–1.2)
Total Protein: 6.3 g/dL — ABNORMAL LOW (ref 6.5–8.1)

## 2019-06-02 LAB — CBC WITH DIFFERENTIAL/PLATELET
Abs Immature Granulocytes: 0.1 10*3/uL — ABNORMAL HIGH (ref 0.00–0.07)
Basophils Absolute: 0 10*3/uL (ref 0.0–0.1)
Basophils Relative: 0 %
Eosinophils Absolute: 0.3 10*3/uL (ref 0.0–0.5)
Eosinophils Relative: 3 %
HCT: 24.5 % — ABNORMAL LOW (ref 36.0–46.0)
Hemoglobin: 7.9 g/dL — ABNORMAL LOW (ref 12.0–15.0)
Immature Granulocytes: 1 %
Lymphocytes Relative: 23 %
Lymphs Abs: 2.6 10*3/uL (ref 0.7–4.0)
MCH: 29.7 pg (ref 26.0–34.0)
MCHC: 32.2 g/dL (ref 30.0–36.0)
MCV: 92.1 fL (ref 80.0–100.0)
Monocytes Absolute: 0.9 10*3/uL (ref 0.1–1.0)
Monocytes Relative: 8 %
Neutro Abs: 7.7 10*3/uL (ref 1.7–7.7)
Neutrophils Relative %: 65 %
Platelets: 326 10*3/uL (ref 150–400)
RBC: 2.66 MIL/uL — ABNORMAL LOW (ref 3.87–5.11)
RDW: 14.6 % (ref 11.5–15.5)
WBC: 11.7 10*3/uL — ABNORMAL HIGH (ref 4.0–10.5)
nRBC: 0.2 % (ref 0.0–0.2)

## 2019-06-02 LAB — GLUCOSE, CAPILLARY
Glucose-Capillary: 154 mg/dL — ABNORMAL HIGH (ref 70–99)
Glucose-Capillary: 164 mg/dL — ABNORMAL HIGH (ref 70–99)
Glucose-Capillary: 193 mg/dL — ABNORMAL HIGH (ref 70–99)
Glucose-Capillary: 131 mg/dL — ABNORMAL HIGH (ref 70–99)

## 2019-06-02 LAB — SEDIMENTATION RATE: Sed Rate: >140 mm/hr — ABNORMAL HIGH (ref 0–22)

## 2019-06-02 LAB — D-DIMER, QUANTITATIVE: D-Dimer, Quant: 4.89 ug/mL-FEU — ABNORMAL HIGH (ref 0.00–0.50)

## 2019-06-02 LAB — CK: Total CK: 51 U/L (ref 38–234)

## 2019-06-02 LAB — PHOSPHORUS: Phosphorus: 3.7 mg/dL (ref 2.5–4.6)

## 2019-06-02 LAB — C-REACTIVE PROTEIN: CRP: 23.1 mg/dL — ABNORMAL HIGH (ref <1.0)

## 2019-06-02 LAB — FERRITIN: Ferritin: 98 ng/mL (ref 11–307)

## 2019-06-02 LAB — MAGNESIUM: Magnesium: 1.5 mg/dL — ABNORMAL LOW (ref 1.7–2.4)

## 2019-06-02 MED ORDER — MAGNESIUM SULFATE 2 GM/50ML IV SOLN
2.0000 g | Freq: Once | INTRAVENOUS | Status: AC
  Administered 2019-06-02: 2 g via INTRAVENOUS
  Filled 2019-06-02: qty 50

## 2019-06-02 NOTE — Progress Notes (Signed)
PROGRESS NOTE    Alice Kemp  DPO:[id number] DOB: May 15, 1951 DOA: 05/24/2019 PCP: [name], MD   Brief Narrative:  Patient is a 68 year old morbidly obese Caucasian female with a past medical history significant for but not limited to anxiety and depression, osteoarthritis, chronic back pain, benign colonic neoplasm, history of left breast abscess, cervical radiculopathy, CKD stage III, constipation, type 2 diabetes mellitus, chronic diastolic CHF, glaucoma, history of gout, hypertension, hyperlipidemia, hypothyroidism, morbid obesity, obstructive sleep apnea, history of pericardial effusion and pulmonary hypertension, vitamin B12 and vitamin D deficiency as well as other comorbidities who presented with worsening low back pain and also found to have abdominal cellulitis.  She is tested positive for COVID but was diagnosed back in June.  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  PT OT evaluated and recommending skilled nursing facility.  Assessment & Plan:   Principal Problem:   Cellulitis of abdominal wall Active Problems:   Uncontrolled type 2 diabetes mellitus with stage 3 chronic kidney disease (HCC)   Morbid obesity (HCC)   Hypothyroidism   Chronic diastolic heart failure (HCC)   Essential hypertension   AKI (acute kidney injury) (HCC)   Hyponatremia   Anemia   HNP (herniated nucleus pulposus), lumbar  Lower back pain with radiculopathy and left foot drop along with left greater than right lower extremity weakness -Associated LE weakness. 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  -S/p lumbar laminectomy and microdiscectomy on 8/24 -Appreciate further Neurosurgery recommendations and evaluation -Continue with acetaminophen 650 mg p.o./RC every 4 PRN for mild pain, methocarbamol 500 mg  p.o. every 8 as needed for muscle spasms with oxycodone 10 mg every 6 as needed severe pain -Continue with supportive care and bowel regimen docusate 100 g p.o. twice daily, as well as antiemetics with ondansetron 4 mg p.o./IV every 6 as needed for nausea and vomiting -VTE prophylaxis resumption when okay with neurosurgery -PT OT recommending SNF and social work consulted for assistance with placement  Abdominal Wound/Cellulitis -Improving on antibiotics. Empirically treated with ceftriaxone and transitioned to Keflex today's last day. -WBC went from 12.2 -> 11.7 -Continued Cephalexin every 12h with an end date today 8/27  Lethargy -Secondary to combination of dilaudid and flexeril -Discontinue dilaudid -C/w Pulse oximetry, if worsens, will obtain ABG/give Narcan  Chronic Diastolic Heart Failure -Appears Euvolemic and has no Signs of Decompensation -Continue Holding Diuretics and Antihypertensives -Strict I's/O's and Daily Weights; patient is -13.343 L examined she is only lost 1 since 05/24/2019 but repeat weights have not been done -Continue to monitor for signs and symptoms of volume overload  Diabetes Mellitus Type 2 -Continue Novolog Mix 70/30 8 units sq BIDwm and with Moderate Novolog SSI AC -HbA1c was 8.9 -CBG's ranging from 142-176  Hypothyroidism -TSH was 0.819 -Continue Levothyroxine 88 mcg po Daily   Normocytic Anemia/Anemia of Chronic Kidney Disease  -Unsure of etiology. Possibly secondary to kidney disease. Iron panel obtained and is normal. No obvious source of bleeding -Patient's hemoglobin/hematocrit is relatively stable and 7.9/24.5 and yesterday it was 7.8/24.5 -Continue to monitor for signs and symptoms of bleeding; currently no overt bleeding noted -Continue monitor repeat CBC in a.m.  Hyponatremia -Thought to be secondary to diuretic use and GI loses so she was Given IV fluids. -Na+ had improved but is now 133 and Mildly low -Continue  to Monitor and  Trend -Repeat CMP in AM   Essential Hypertension -Stable. Continuing to Hold Diuretics and ARB -Last blood pressure is 126/61  Hyperlipidemia -Continue Rosuvastatin 5 mg po Daily   GERD/GI Prophylaxis -C/w Pantoprazole 40 mg po qhS  COVID-19 Positive -Patient has been asymptomatic for weeks. Currently on precautions -Has been on room air without hypoxia or respiratory symptoms -Had exposure back in June and I declined testing at that time. -Currently has no active symptoms -Inflammatory markers are elevated and some are trending up and last CRP was 14.9 on 05/28/2019 and repeat as trended up to 23.1 -LDH was 119 but trended down to 102, ferritin level was 86, ESR this morning was greater than 140 and yesterday was 140, and D-Dimer was 4.01 and trending up and is now 4.89 -Will obtain CXR; had to be placed on oxygen yesterday after she received Robaxin but currently off of it now -Continue to monitor and trend inflammatory markers  Morbid Obesity -Estimated body mass index is 43.9 kg/m as calculated from the following:   Height as of this encounter: '5\' 4"'$  (1.626 m).   Weight as of this encounter: 116 kg. -Weight Loss and Dietary Counseling given   AKI on CKD Stage 3 -Improved. BUN/Cr is improved to 25/1.43; yesterday it was 27/1.50 -Continue to Monitor Strict I's/O's -Nephrotoxic medications, contrast dyes, hypotension if possible -Continue monitor and trend renal function  -IV fluid hydration is now stopped -Repeat CMP in the a.m.  Anxiety and Depression -Continue with Amitriptyline 10 mg p.o. nightly as needed for sleep, Bupropion 200 mg p.o. daily  Gout -Continue with Allopurinol 100 mg p.o. daily  Hypomagnesemia  -Patient's magnesium level is 1.5 -Replete with IV mag sulfate 2 g -Continue monitor replete as necessary -Repeat magnesium level in the a.m.  DVT prophylaxis: SCDs but will need to go back on VTE Prophylaxis when ok with Neurosurgery.  Code Status:  FULL CODE Family Communication: No family present at bedside Disposition Plan: SNF  Consultants:   Neurosurgery Dr. Kary Kos   Procedures:  Complete decompressive laminectomy L3 partial inferior laminectomy L2.  With microscopic discectomy from the left and foraminotomies of the left L3 nerve root.   Antimicrobials:  Anti-infectives (From admission, onward)   Start     Dose/Rate Route Frequency Ordered Stop   05/31/19 0000  ceFAZolin (ANCEF) IVPB 2g/100 mL premix     2 g 200 mL/hr over 30 Minutes Intravenous Every 8 hours 05/30/19 2312 05/31/19 0845   05/30/19 1657  bacitracin 50,000 Units in sodium chloride 0.9 % 500 mL irrigation  Status:  Discontinued       As needed 05/30/19 1658 05/30/19 1840   05/26/19 2200  cephALEXin (KEFLEX) capsule 500 mg     500 mg Oral Every 12 hours 05/26/19 1618 06/02/19 2359   05/24/19 2000  meropenem (MERREM) 1 g in sodium chloride 0.9 % 100 mL IVPB  Status:  Discontinued     1 g 200 mL/hr over 30 Minutes Intravenous Every 12 hours 05/24/19 0611 05/24/19 1303   05/24/19 1600  cefTRIAXone (ROCEPHIN) 2 g in sodium chloride 0.9 % 100 mL IVPB  Status:  Discontinued     2 g 200 mL/hr over 30 Minutes Intravenous Every 24 hours 05/24/19 1303 05/26/19 1618   05/24/19 0630  vancomycin (VANCOCIN) 2,000 mg in sodium chloride 0.9 % 500 mL IVPB     2,000 mg 250 mL/hr over 120 Minutes Intravenous  Once 05/24/19 0554 05/24/19 0859  05/24/19 0611  vancomycin variable dose per unstable renal function (pharmacist dosing)  Status:  Discontinued      Does not apply See admin instructions 05/24/19 0611 05/24/19 1303   05/24/19 0600  meropenem (MERREM) 1 g in sodium chloride 0.9 % 100 mL IVPB     1 g 200 mL/hr over 30 Minutes Intravenous  Once 05/24/19 0545 05/24/19 0640     Subjective: Seen and examined at bedside still complaining of pain and states that she had significant posterior leg pain as well as thigh pain on the left more so compared to the right.   States that still very difficult to move her left foot but states is a little bit better.  No chest pain, lightheadedness or dizziness.  No nausea or vomiting.  Denies any shortness of breath but had to be placed on oxygen after she received Robaxin yesterday.  Off oxygen now.  Objective: Vitals:   06/01/19 1600 06/01/19 1650 06/01/19 2340 06/02/19 0811  BP: 118/60  128/67 126/61  Pulse: 96  84 85  Resp: 18     Temp: 99.9 F (37.7 C)  98.5 F (36.9 C) 99.9 F (37.7 C)  TempSrc: Axillary  Oral Axillary  SpO2: (!) 88% 93% 90% 94%  Weight:      Height:        Intake/Output Summary (Last 24 hours) at 06/02/2019 1529 Last data filed at 06/02/2019 1500 Gross per 24 hour  Intake 53.38 ml  Output 1150 ml  Net -1096.62 ml   Filed Weights   05/25/19 0435 05/29/19 0606 05/30/19 0500  Weight: 116.2 kg 117.3 kg 116 kg   Examination: Physical Exam:  Constitutional: Well-nourished, well-developed morbidly obese Caucasian female currently no acute distress appears calm but a little uncomfortable Eyes: Lids and conjunctive are normal.  Sclera anicteric ENMT: External ears nose appear normal. Neck: Appears supple no JVD Respiratory: Slightly diminished auscultation bilaterally no appreciable wheezing, rales, rhonchi.  Patient not tachypneic or using accessory muscles breathe Cardiovascular: Regular rate and rhythm.  No appreciable murmurs, rubs, gallops.  No extremity edema noted Abdomen: Soft, nontender, distended secondary body habitus.  Bowel sounds present x4; has a cellulitic area in lesions in the right lower quadrant of her abdomen and she is extremely large pannus GU: Deferred Musculoskeletal: No appreciable contractures or cyanosis.  No joint deformities in the upper lower extremities Skin: No rashes or lesions on to skin evaluation but does have some cellulitic changes on her abdomen which is starting to crust over slightly improving. Neurologic: Cranial nerves II through XII grossly  intact no appreciable focal deficits.  Romberg sign is cerebellar reflexes were not assessed.  Has weakness in her left leg compared to the right and has a slight foot drop Psychiatric: Slightly frustrated mood and affect.  Intact judgment intact.  Patient is awake, alert, oriented x3  Data Reviewed: I have personally reviewed following labs and imaging studies  CBC: Recent Labs  Lab 05/28/19 0854 05/29/19 0546 06/01/19 0538 06/02/19 0842  WBC 8.4 9.5 12.2* 11.7*  NEUTROABS 4.6 4.3  --  7.7  HGB 8.3* 8.4* 7.8* 7.9*  HCT 26.9* 27.0* 24.5* 24.5*  MCV 95.4 96.1 93.5 92.1  PLT 244 271 304 482   Basic Metabolic Panel: Recent Labs  Lab 05/28/19 0854 05/29/19 0546 06/01/19 0538 06/02/19 0842  NA 135 137 132* 133*  K 5.1 4.9 4.7 4.6  CL 105 105 101 97*  CO2 20* '23 23 25  '$ GLUCOSE 147* 130* 173* 186*  BUN 29* 32* 27* 25*  CREATININE 1.47* 1.52* 1.50* 1.43*  CALCIUM 8.7* 8.9 8.6* 8.6*  MG 1.7 1.9  --  1.5*  PHOS 4.0 4.7*  --  3.7   GFR: Estimated Creatinine Clearance: 47.1 mL/min (A) (by C-G formula based on SCr of 1.43 mg/dL (H)). Liver Function Tests: Recent Labs  Lab 05/28/19 0854 05/29/19 0546 06/02/19 0842  AST  --   --  18  ALT  --   --  11  ALKPHOS  --   --  74  BILITOT  --   --  0.6  PROT  --   --  6.3*  ALBUMIN 2.3* 2.3* 1.8*   No results for input(s): LIPASE, AMYLASE in the last 168 hours. No results for input(s): AMMONIA in the last 168 hours. Coagulation Profile: No results for input(s): INR, PROTIME in the last 168 hours. Cardiac Enzymes: Recent Labs  Lab 06/02/19 0842  CKTOTAL 51   BNP (last 3 results) No results for input(s): PROBNP in the last 8760 hours. HbA1C: No results for input(s): HGBA1C in the last 72 hours. CBG: Recent Labs  Lab 06/01/19 1125 06/01/19 1622 06/01/19 2136 06/02/19 0804 06/02/19 1136  GLUCAP 201* 142* 176* 154* 164*   Lipid Profile: No results for input(s): CHOL, HDL, LDLCALC, TRIG, CHOLHDL, LDLDIRECT in the last  72 hours. Thyroid Function Tests: No results for input(s): TSH, T4TOTAL, FREET4, T3FREE, THYROIDAB in the last 72 hours. Anemia Panel: Recent Labs    06/01/19 0858 06/02/19 0842  FERRITIN 86 98   Sepsis Labs: No results for input(s): PROCALCITON, LATICACIDVEN in the last 168 hours.  Recent Results (from the past 240 hour(s))  SARS Coronavirus 2 [facility] order, Performed in [facility] hospital lab) Nasopharyngeal Nasopharyngeal Swab     Status: Abnormal   Collection Time: 05/24/19  4:50 AM   Specimen: Nasopharyngeal Swab  Result Value Ref Range Status   SARS Coronavirus 2 POSITIVE (A) NEGATIVE Final    Comment: RESULT CALLED TO, READ BACK BY AND VERIFIED WITH: JENNIFER HUFF, RN [id number] @ [id number] BY J SCOTTON (NOTE) If result is NEGATIVE SARS-CoV-2 target nucleic acids are NOT DETECTED. The SARS-CoV-2 RNA is generally detectable in upper and lower  respiratory specimens during the acute phase of infection. The lowest  concentration of SARS-CoV-2 viral copies this assay can detect is 250  copies / mL. A negative result does not preclude SARS-CoV-2 infection  and should not be used as the sole basis for treatment or other  patient management decisions.  A negative result may occur with  improper specimen collection / handling, submission of specimen other  than nasopharyngeal swab, presence of viral mutation(s) within the  areas targeted by this assay, and inadequate number of viral copies  (<250 copies / mL). A negative result must be combined with clinical  observations, patient history, and epidemiological information. If result is POSITIVE SARS-CoV-2 target nucleic acids are DET ECTED. The SARS-CoV-2 RNA is generally detectable in upper and lower  respiratory specimens during the acute phase of infection.  Positive  results are indicative of active infection with SARS-CoV-2.  Clinical  correlation with patient history and other diagnostic information is  necessary to determine  patient infection status.  Positive results do  not rule out bacterial infection or co-infection with other viruses. If result is PRESUMPTIVE POSTIVE SARS-CoV-2 nucleic acids MAY BE PRESENT.   A presumptive positive result was obtained on the submitted specimen  and confirmed on repeat testing.  While 2019 novel  coronavirus  (SARS-CoV-2) nucleic acids may be present in the submitted sample  additional confirmatory testing may be necessary for epidemiological  and / or clinical management purposes  to differentiate between  SARS-CoV-2 and other Sarbecovirus currently known to infect humans.  If clinically indicated additional testing with an alternate test  methodology ([place]) is advised. The SARS-CoV-2 RNA is generally  detectable in upper and lower respiratory specimens during the acute  phase of infection. The expected result is Negative. Fact Sheet for Patients:  StrictlyIdeas.no Fact Sheet for Healthcare Providers: BankingDealers.co.za This test is not yet approved or cleared by the [place] FDA and has been authorized for detection and/or diagnosis of SARS-CoV-2 by FDA under an Emergency Use Authorization (EUA).  This EUA will remain in effect (meaning this test can be used) for the duration of the COVID-19 declaration under Section 564(b)(1) of the Act, 21 U.S.C. section 360bbb-3(b)(1), unless the authorization is terminated or revoked sooner. Performed at [facility], [place] [address] [place], [place] [address]     Radiology Studies: No results found. Scheduled Meds: . allopurinol  100 mg Oral Daily  . aspirin EC  81 mg Oral Daily  . buPROPion  200 mg Oral Daily  . cephALEXin  500 mg Oral Q12H  . docusate sodium  100 mg Oral BID  . DULoxetine  30 mg Oral BID  . gabapentin  300 mg Oral TID  . insulin aspart  0-15 Units Subcutaneous TID WC  . insulin aspart protamine- aspart  8 Units Subcutaneous  BID WC  . levothyroxine  88 mcg Oral QAC breakfast  . loratadine  10 mg Oral QHS  . magnesium oxide  200 mg Oral Daily  . pantoprazole  40 mg Oral QHS  . rosuvastatin  5 mg Oral Daily  . sodium chloride flush  3 mL Intravenous Q12H  . vitamin B-12  500 mcg Oral BID   Continuous Infusions: . sodium chloride 250 mL (05/30/19 2342)    LOS: 9 days   Kerney Elbe, DO [facility] PAGER is on AMION  If 7PM-7AM, please contact night-coverage [email or web address] Password [facility] 06/02/2019, 3:29 PM

## 2019-06-02 NOTE — Progress Notes (Signed)
Physical Therapy Treatment Patient Details Name: Alice Kemp MRN: [id number] DOB: 1951-07-13 Today's Date: 06/02/2019    History of Present Illness 68 y.o. female with PMH - anxiety, depression, OA, chronic back pain, cervical radiculopathy, ckd, dm, chf, glaucoma, gout, hypothyroidism, morbid obesity, OSA, pericardial effusion, pulmonary htn, presented with progressively worse lower back pain with worsening lower extremity weakness, generalized weakness for several days, multiple episodes of emesis, decreased oral intake, worsening right-sided lower abdomen wounds.  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    PT Comments    Pt noted to have incr strength in LLE including some ankle movement. Remains too weak to attempt standing. Transition to EOB and back down to bed is very difficult and painful for pt. Continue to recommend SNF for extended rehab.   Follow Up Recommendations  SNF     Equipment Recommendations  Other (comment)(TBD, if home, may need w/c)    Recommendations for Other Services       Precautions / Restrictions Precautions Precautions: Fall;Back Precaution Booklet Issued: No Restrictions Weight Bearing Restrictions: Yes    Mobility  Bed Mobility Overal bed mobility: Needs Assistance Bed Mobility: Sidelying to Sit;Sit to Sidelying;Rolling Rolling: +2 for physical assistance;Max assist Sidelying to sit: +2 for physical assistance;Max assist     Sit to sidelying: +2 for physical assistance;Total assist General bed mobility comments: Assist for all aspects. Transition difficult and painful. Body habitus increases difficulty.    Transfers                 General transfer comment: Unable to safely attempt due to profound LE weakness.   Ambulation/Gait                 Stairs             Wheelchair Mobility    Modified Rankin (Stroke Patients Only)       Balance Overall balance assessment: Needs assistance;History of Falls Sitting-balance support: Feet supported;Bilateral upper extremity supported Sitting balance-Leahy Scale: Poor Sitting balance - Comments: Pt sat EOB x 10-12 minutes with initial mod assist progressing to min guard.  Postural control: Posterior lean                                  Cognition Arousal/Alertness: Awake/alert Behavior During Therapy: Flat affect Overall Cognitive Status: Impaired/Different from baseline Area of Impairment: Memory;Following commands;Problem solving                     Memory: Decreased short-term memory Following Commands: Follows one step commands with increased time     Problem Solving: Slow processing;Requires verbal cues;Requires tactile cues General Comments: Pt with improved cognition since last seen      Exercises General Exercises - Lower Extremity Ankle Circles/Pumps: AROM;Both;10 reps;Seated Long Arc Quad: AAROM;Both;5 reps;Seated    General Comments General comments (skin integrity, edema, etc.): LLE strength dorsiflex 1/5, knee/hip 2-/5. RLE strength 2/5      Pertinent Vitals/Pain Pain Assessment: Faces Faces Pain Scale: Hurts worst Pain Location: back and shoulders    Home Living  Prior Function            PT Goals (current goals can now be found in the care plan section) Progress towards PT goals: Progressing toward goals    Frequency    Min 3X/week      PT Plan Current plan remains appropriate    Co-evaluation              AM-PAC PT "6 Clicks" Mobility   Outcome Measure  Help needed turning from your back to your side while in  a flat bed without using bedrails?: A Lot Help needed moving from lying on your back to sitting on the side of a flat bed without using bedrails?: Total Help needed moving to and from a bed to a chair (including a wheelchair)?: Total Help needed standing up from a chair using your arms (e.g., wheelchair or bedside chair)?: Total Help needed to walk in hospital room?: Total Help needed climbing 3-5 steps with a railing? : Total 6 Click Score: 7    End of Session   Activity Tolerance: Patient limited by pain Patient left: in bed;with call bell/phone within reach;with bed alarm set Nurse Communication: Mobility status;Patient requests pain meds;Need for lift equipment(nurse assisted with bed mobility) PT Visit Diagnosis: Other abnormalities of gait and mobility (R26.89);Repeated falls (R29.6)     Time: 1350-1408 PT Time Calculation (min) (ACUTE ONLY): 18 min  Charges:  $Therapeutic Activity: 8-22 mins                     [place] Pager [phone] Office [place] 06/02/2019, 3:48 PM

## 2019-06-03 LAB — PROCALCITONIN: Procalcitonin: 0.12 ng/mL

## 2019-06-03 LAB — CBC WITH DIFFERENTIAL/PLATELET
Abs Immature Granulocytes: 0.1 10*3/uL — ABNORMAL HIGH (ref 0.00–0.07)
Basophils Absolute: 0 10*3/uL (ref 0.0–0.1)
Basophils Relative: 0 %
Eosinophils Absolute: 0.3 10*3/uL (ref 0.0–0.5)
Eosinophils Relative: 3 %
HCT: 24.3 % — ABNORMAL LOW (ref 36.0–46.0)
Hemoglobin: 7.7 g/dL — ABNORMAL LOW (ref 12.0–15.0)
Immature Granulocytes: 1 %
Lymphocytes Relative: 33 %
Lymphs Abs: 4 10*3/uL (ref 0.7–4.0)
MCH: 29.3 pg (ref 26.0–34.0)
MCHC: 31.7 g/dL (ref 30.0–36.0)
MCV: 92.4 fL (ref 80.0–100.0)
Monocytes Absolute: 1.1 10*3/uL — ABNORMAL HIGH (ref 0.1–1.0)
Monocytes Relative: 9 %
Neutro Abs: 6.5 10*3/uL (ref 1.7–7.7)
Neutrophils Relative %: 54 %
Platelets: 309 10*3/uL (ref 150–400)
RBC: 2.63 MIL/uL — ABNORMAL LOW (ref 3.87–5.11)
RDW: 14.6 % (ref 11.5–15.5)
WBC: 12 10*3/uL — ABNORMAL HIGH (ref 4.0–10.5)
nRBC: 0.2 % (ref 0.0–0.2)

## 2019-06-03 LAB — COMPREHENSIVE METABOLIC PANEL
ALT: 14 U/L (ref 0–44)
AST: 31 U/L (ref 15–41)
Albumin: 1.8 g/dL — ABNORMAL LOW (ref 3.5–5.0)
Alkaline Phosphatase: 84 U/L (ref 38–126)
Anion gap: 11 (ref 5–15)
BUN: 31 mg/dL — ABNORMAL HIGH (ref 8–23)
CO2: 22 mmol/L (ref 22–32)
Calcium: 8.5 mg/dL — ABNORMAL LOW (ref 8.9–10.3)
Chloride: 99 mmol/L (ref 98–111)
Creatinine, Ser: 1.63 mg/dL — ABNORMAL HIGH (ref 0.44–1.00)
GFR calc Af Amer: 37 mL/min — ABNORMAL LOW (ref >60)
GFR calc non Af Amer: 32 mL/min — ABNORMAL LOW (ref >60)
Glucose, Bld: 142 mg/dL — ABNORMAL HIGH (ref 70–99)
Potassium: 4.6 mmol/L (ref 3.5–5.1)
Sodium: 132 mmol/L — ABNORMAL LOW (ref 135–145)
Total Bilirubin: 0.5 mg/dL (ref 0.3–1.2)
Total Protein: 6.2 g/dL — ABNORMAL LOW (ref 6.5–8.1)

## 2019-06-03 LAB — BASIC METABOLIC PANEL
Anion gap: 10 (ref 5–15)
BUN: 30 mg/dL — ABNORMAL HIGH (ref 8–23)
CO2: 25 mmol/L (ref 22–32)
Calcium: 8.2 mg/dL — ABNORMAL LOW (ref 8.9–10.3)
Chloride: 96 mmol/L — ABNORMAL LOW (ref 98–111)
Creatinine, Ser: 1.56 mg/dL — ABNORMAL HIGH (ref 0.44–1.00)
GFR calc Af Amer: 39 mL/min — ABNORMAL LOW (ref >60)
GFR calc non Af Amer: 34 mL/min — ABNORMAL LOW (ref >60)
Glucose, Bld: 177 mg/dL — ABNORMAL HIGH (ref 70–99)
Potassium: 4.7 mmol/L (ref 3.5–5.1)
Sodium: 131 mmol/L — ABNORMAL LOW (ref 135–145)

## 2019-06-03 LAB — FERRITIN: Ferritin: 114 ng/mL (ref 11–307)

## 2019-06-03 LAB — LACTATE DEHYDROGENASE: LDH: 134 U/L (ref 98–192)

## 2019-06-03 LAB — SEDIMENTATION RATE: Sed Rate: >140 mm/hr — ABNORMAL HIGH (ref 0–22)

## 2019-06-03 LAB — PHOSPHORUS: Phosphorus: 4.7 mg/dL — ABNORMAL HIGH (ref 2.5–4.6)

## 2019-06-03 LAB — GLUCOSE, CAPILLARY
Glucose-Capillary: 142 mg/dL — ABNORMAL HIGH (ref 70–99)
Glucose-Capillary: 197 mg/dL — ABNORMAL HIGH (ref 70–99)
Glucose-Capillary: 144 mg/dL — ABNORMAL HIGH (ref 70–99)
Glucose-Capillary: 117 mg/dL — ABNORMAL HIGH (ref 70–99)

## 2019-06-03 LAB — MAGNESIUM: Magnesium: 1.7 mg/dL (ref 1.7–2.4)

## 2019-06-03 LAB — C-REACTIVE PROTEIN: CRP: 20.3 mg/dL — ABNORMAL HIGH (ref <1.0)

## 2019-06-03 LAB — CK: Total CK: 313 U/L — ABNORMAL HIGH (ref 38–234)

## 2019-06-03 LAB — D-DIMER, QUANTITATIVE: D-Dimer, Quant: 4.56 ug/mL-FEU — ABNORMAL HIGH (ref 0.00–0.50)

## 2019-06-03 MED ORDER — SODIUM CHLORIDE 0.9 % IV BOLUS
500.0000 mL | Freq: Once | INTRAVENOUS | Status: AC
  Administered 2019-06-03: 500 mL via INTRAVENOUS

## 2019-06-03 MED ORDER — SODIUM CHLORIDE 0.9 % IV SOLN
INTRAVENOUS | Status: DC
  Administered 2019-06-03 – 2019-06-04 (×2): via INTRAVENOUS

## 2019-06-03 MED ORDER — MAGNESIUM SULFATE IN D5W 1-5 GM/100ML-% IV SOLN
1.0000 g | Freq: Once | INTRAVENOUS | Status: AC
  Administered 2019-06-03: 1 g via INTRAVENOUS
  Filled 2019-06-03: qty 100

## 2019-06-03 NOTE — TOC Progression Note (Signed)
Transition of Care [facility]) - Progression Note    Patient Details  Name: Alice Kemp MRN: [id number] Date of Birth: 05-Jul-1951  Transition of Care [facility]) CM/SW [place], [place] Phone Number: 06/03/2019, 3:55 PM  Clinical Narrative:   CSW reached back out to [facility] for review, now that patient has been evaluated by PT post-op. [facility] is able to accept patient for SNF. CSW called in authorization request to [facility] for SNF approval, and received authorization [phone]). CSW updated RN, who updated patient; patient is in agreement and ready to start rehab. Awaiting patient to be medically stable for transfer to SNF.          Expected Discharge Plan and Services                                                 Social Determinants of Health (SDOH) Interventions    Readmission Risk Interventions No flowsheet data found.

## 2019-06-03 NOTE — Progress Notes (Signed)
PROGRESS NOTE    Alice Kemp  CBJ:[id number] DOB: 1950/11/25 DOA: 05/24/2019 PCP: [name], MD   Brief Narrative:  Patient is a 68 year old morbidly obese Caucasian female with a past medical history significant for but not limited to anxiety and depression, osteoarthritis, chronic back pain, benign colonic neoplasm, history of left breast abscess, cervical radiculopathy, CKD stage III, constipation, type 2 diabetes mellitus, chronic diastolic CHF, glaucoma, history of gout, hypertension, hyperlipidemia, hypothyroidism, morbid obesity, obstructive sleep apnea, history of pericardial effusion and pulmonary hypertension, vitamin B12 and vitamin D deficiency as well as other comorbidities who presented with worsening low back pain and also found to have abdominal cellulitis.  She is tested positive for COVID but was diagnosed back in June.  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  PT OT evaluated and recommending skilled nursing facility.  Renal Slightly worsened so she was given a 500 mL bolus and started on maintenance IV fluids.  Some inflammatory markers are trending down and states her pain is little bit better today.  Will repeat inflammatory markers as well as a renal function in the a.m. and if renal function, sodium as well as inflammatory markers are improved we will discharge to skilled nursing facility.  Assessment & Plan:   Principal Problem:   Cellulitis of abdominal wall Active Problems:   Uncontrolled type 2 diabetes mellitus with stage 3 chronic kidney disease (HCC)   Morbid obesity (HCC)   Hypothyroidism   Chronic diastolic heart failure (HCC)   Essential hypertension   AKI (acute kidney injury) (HCC)   Hyponatremia   Anemia   HNP (herniated nucleus pulposus), lumbar  Lower back pain with radiculopathy and left foot drop along with left greater than right  lower extremity weakness -Associated LE weakness. 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  -S/p lumbar laminectomy and microdiscectomy on 8/24 -Appreciate further Neurosurgery recommendations and evaluation -Continue with acetaminophen 650 mg p.o./RC every 4 PRN for mild pain, methocarbamol 500 mg p.o. every 8 as needed for muscle spasms with oxycodone 10 mg every 6 as needed severe pain -Continue with supportive care and bowel regimen docusate 100 g p.o. twice daily, as well as antiemetics with ondansetron 4 mg p.o./IV every 6 as needed for nausea and vomiting -VTE prophylaxis resumption when okay with neurosurgery and I discussed the the case with Dr. Bennie Hind who recommends 5 days postoperatively -PT OT recommending SNF and social work consulted for assistance with placement  Abdominal Wound/Cellulitis -Improving on antibiotics. Empirically treated with ceftriaxone and transitioned to Keflex today's last day. -WBC went from 12.2 -> 11.7 -> 12.0 -Continued Cephalexin but now stopped on 8/27 -Continue to monitor   Lethargy -Secondary to combination of dilaudid and flexeril -Discontinue dilaudid -C/w Pulse oximetry, if worsens, will obtain ABG/give Narcan  Chronic Diastolic Heart Failure -Appeared a little dry and has no Signs of Decompensation -Continue Holding Diuretics and Antihypertensives -Strict I's/O's and Daily Weights; patient is -13.279 L examined she is only lost 1 since 05/24/2019 but repeat weights have not been done -Continue to monitor for signs and symptoms of volume overload as will start low dose IVF hydration   Diabetes Mellitus Type 2 -Continue Novolog Mix 70/30 8 units sq BIDwm and with Moderate Novolog SSI AC -HbA1c was 8.9 -CBG's ranging from 131-197  Hypothyroidism -TSH was 0.819 -  Continue Levothyroxine 88 mcg po Daily   Normocytic Anemia/Anemia of Chronic Kidney  Disease  -Unsure of etiology. Possibly secondary to kidney disease. Iron panel obtained and is normal. No obvious source of bleeding -Patient's hemoglobin/hematocrit is relatively stable and was 7.7/24.3 -Continue to monitor for signs and symptoms of bleeding; currently no overt bleeding noted -Continue monitor repeat CBC in a.m.  Hyponatremia -Thought to be secondary to diuretic use and GI loses so she was Given IV fluids. IVF had stopped  -Na+ is now 131 will resume IV fluids at 75 mL's per hour -Continue to Monitor and Trend -Repeat CMP in AM   Essential Hypertension -Stable. Continuing to Hold Diuretics and ARB -Last blood pressure is 126/61  Hyperlipidemia -Continue Rosuvastatin 5 mg po Daily   GERD/GI Prophylaxis -C/w Pantoprazole 40 mg po qhS  COVID-19 Positive -Patient has been asymptomatic for weeks. Currently on precautions -Has been on room air without hypoxia or respiratory symptoms -Had exposure back in June and I declined testing at that time. -Currently has no active symptoms -Inflammatory markers are elevated and some are trending up and last CRP was 14.9 on 05/28/2019 and repeat as trended up to 23.1; Now trending down and was 20.3 -PCT was 0.12 -LDH was 119 but trended down to 102, ferritin level was 86, ESR this morning was >140 and yesterday was 140, and D-Dimer was 4.01 and trended up to 4.89 but is now 4.56  -CXR done yesterday showed cardiomegaly with out acute abnormality in the lungs and AP portable projection -CK trended up and went from 51-313 and LDH also went up from 100-234 -Continue to monitor and trend inflammatory markers -Patient was on oxygen this morning when I saw her but she is only on half a liter and saturating 98%  Morbid Obesity -Estimated body mass index is 44.31 kg/m as calculated from the following:   Height as of this encounter: '5\' 4"'$  (1.626 m).   Weight as of this encounter: 117.1 kg. -Weight Loss and Dietary Counseling given    AKI on CKD Stage 3, worsening -Worsening slightly; patient's BUN/creatinine went from 25/1.43 is now trended up to 31/1.63; repeat this afternoon showed slight improvement to 30/1.56 we will continue IV fluid hydration today and repeat in a.m. -We will give IV fluid hydration with normal saline at a rate of 75 mL's per hour and also give a 500 mL bolus -Continue to Monitor Strict I's/O's -Nephrotoxic medications, contrast dyes, hypotension if possible -Continue monitor and trend renal function  -Repeat CMP in the a.m.  Anxiety and Depression -Continue with Amitriptyline 10 mg p.o. nightly as needed for sleep, Bupropion 200 mg p.o. daily  Gout -Continue with Allopurinol 100 mg p.o. daily  Hypomagnesemia  -Patient's magnesium level is 1.7 -Replete with IV mag sulfate 1 g -Continue monitor replete as necessary -Repeat magnesium level in the a.m.  DVT prophylaxis: SCDs but will need to go back on VTE Prophylaxis when ok with Neurosurgery.  Code Status: FULL CODE Family Communication: No family present at bedside Disposition Plan: SNF  Consultants:   Neurosurgery Dr. Kary Kos   Procedures:  Complete decompressive laminectomy L3 partial inferior laminectomy L2.  With microscopic discectomy from the left and foraminotomies of the left L3 nerve root.   Antimicrobials:  Anti-infectives (From admission, onward)   Start     Dose/Rate Route Frequency Ordered Stop   05/31/19 0000  ceFAZolin (ANCEF) IVPB 2g/100 mL premix     2 g 200 mL/hr over 30 Minutes Intravenous  Every 8 hours 05/30/19 2312 05/31/19 0845   05/30/19 1657  bacitracin 50,000 Units in sodium chloride 0.9 % 500 mL irrigation  Status:  Discontinued       As needed 05/30/19 1658 05/30/19 1840   05/26/19 2200  cephALEXin (KEFLEX) capsule 500 mg     500 mg Oral Every 12 hours 05/26/19 1618 06/02/19 2359   05/24/19 2000  meropenem (MERREM) 1 g in sodium chloride 0.9 % 100 mL IVPB  Status:  Discontinued     1 g 200 mL/hr  over 30 Minutes Intravenous Every 12 hours 05/24/19 0611 05/24/19 1303   05/24/19 1600  cefTRIAXone (ROCEPHIN) 2 g in sodium chloride 0.9 % 100 mL IVPB  Status:  Discontinued     2 g 200 mL/hr over 30 Minutes Intravenous Every 24 hours 05/24/19 1303 05/26/19 1618   05/24/19 0630  vancomycin (VANCOCIN) 2,000 mg in sodium chloride 0.9 % 500 mL IVPB     2,000 mg 250 mL/hr over 120 Minutes Intravenous  Once 05/24/19 0554 05/24/19 0859   05/24/19 0611  vancomycin variable dose per unstable renal function (pharmacist dosing)  Status:  Discontinued      Does not apply See admin instructions 05/24/19 0611 05/24/19 1303   05/24/19 0600  meropenem (MERREM) 1 g in sodium chloride 0.9 % 100 mL IVPB     1 g 200 mL/hr over 30 Minutes Intravenous  Once 05/24/19 0545 05/24/19 0640     Subjective: Seen and examined at bedside this morning and states that her pain was little bit better and that she has some more motion in her foot.  No chest pain, lightheadedness or dizziness.  States that she has not been drinking enough water yesterday.  No lightheadedness or dizziness but no other concerns complaints this time.  Objective: Vitals:   06/03/19 0321 06/03/19 0822 06/03/19 0900 06/03/19 1600  BP:  115/63  108/61  Pulse:  79  72  Resp:  19  18  Temp:  99.3 F (37.4 C)  98.9 F (37.2 C)  TempSrc:  Oral  Oral  SpO2:  90% 92% 98%  Weight: 117.1 kg     Height:        Intake/Output Summary (Last 24 hours) at 06/03/2019 1653 Last data filed at 06/03/2019 6144 Gross per 24 hour  Intake 263.18 ml  Output 200 ml  Net 63.18 ml   Filed Weights   05/30/19 0500 06/02/19 0500 06/03/19 0321  Weight: 116 kg 116.8 kg 117.1 kg   Examination: Physical Exam:  Constitutional: Well-nourished, well-developed morbidly obese Caucasian female currently no acute distress appears calm today Eyes: Lids and conjunctive are normal.  Sclera anicteric ENMT: External ears nose appear normal. Neck: Appears supple no JVD  Respiratory: Slightly diminished auscultation bilaterally no patient wheezing, rales, rhonchi.  Patient not tachypneic wheezing and accessory muscle Cardiovascular: Regular rate and rhythm.  No appreciable murmurs, rubs or gallops.  No extremity edema noted. Abdomen: Soft, nontender, distended secondary body habitus and has a very large pannus.  Bowel sounds are present.  Has a cellulitic area in lesions in the lower quadrant on the right side GU: Deferred Musculoskeletal: No contractures or cyanosis.  No joint deformities in the upper and lower extremities Skin: Skin is warm and dry no appreciable rashes or lesions but does have some cellulitic changes and crusting on her abdomen Neurologic: Cranial nerves II through XII gross intact no appreciable focal deficits.  Romberg sign cerebellar reflexes were not assessed.  Continues to  have some mild weakness in her left leg compared to right and foot drop was noted pronounced today but she had her left foot resting instead of the bed Psychiatric: Normal mood and affect and intact judgment and insight.  Patient is awake, alert, oriented x3  Data Reviewed: I have personally reviewed following labs and imaging studies  CBC: Recent Labs  Lab 05/28/19 0854 05/29/19 0546 06/01/19 0538 06/02/19 0842 06/03/19 0549  WBC 8.4 9.5 12.2* 11.7* 12.0*  NEUTROABS 4.6 4.3  --  7.7 6.5  HGB 8.3* 8.4* 7.8* 7.9* 7.7*  HCT 26.9* 27.0* 24.5* 24.5* 24.3*  MCV 95.4 96.1 93.5 92.1 92.4  PLT 244 271 304 326 315   Basic Metabolic Panel: Recent Labs  Lab 05/28/19 0854 05/29/19 0546 06/01/19 0538 06/02/19 0842 06/03/19 0549 06/03/19 1415  NA 135 137 132* 133* 132* 131*  K 5.1 4.9 4.7 4.6 4.6 4.7  CL 105 105 101 97* 99 96*  CO2 20* '23 23 25 22 25  '$ GLUCOSE 147* 130* 173* 186* 142* 177*  BUN 29* 32* 27* 25* 31* 30*  CREATININE 1.47* 1.52* 1.50* 1.43* 1.63* 1.56*  CALCIUM 8.7* 8.9 8.6* 8.6* 8.5* 8.2*  MG 1.7 1.9  --  1.5* 1.7  --   PHOS 4.0 4.7*  --  3.7  4.7*  --    GFR: Estimated Creatinine Clearance: 43.4 mL/min (A) (by C-G formula based on SCr of 1.56 mg/dL (H)). Liver Function Tests: Recent Labs  Lab 05/28/19 0854 05/29/19 0546 06/02/19 0842 06/03/19 0549  AST  --   --  18 31  ALT  --   --  11 14  ALKPHOS  --   --  74 84  BILITOT  --   --  0.6 0.5  PROT  --   --  6.3* 6.2*  ALBUMIN 2.3* 2.3* 1.8* 1.8*   No results for input(s): LIPASE, AMYLASE in the last 168 hours. No results for input(s): AMMONIA in the last 168 hours. Coagulation Profile: No results for input(s): INR, PROTIME in the last 168 hours. Cardiac Enzymes: Recent Labs  Lab 06/02/19 0842 06/03/19 0549  CKTOTAL 51 313*   BNP (last 3 results) No results for input(s): PROBNP in the last 8760 hours. HbA1C: No results for input(s): HGBA1C in the last 72 hours. CBG: Recent Labs  Lab 06/02/19 1619 06/02/19 2130 06/03/19 0812 06/03/19 1148 06/03/19 1554  GLUCAP 193* 131* 142* 197* 144*   Lipid Profile: No results for input(s): CHOL, HDL, LDLCALC, TRIG, CHOLHDL, LDLDIRECT in the last 72 hours. Thyroid Function Tests: No results for input(s): TSH, T4TOTAL, FREET4, T3FREE, THYROIDAB in the last 72 hours. Anemia Panel: Recent Labs    06/02/19 0842 06/03/19 0549  FERRITIN 98 114   Sepsis Labs: Recent Labs  Lab 06/03/19 0549  PROCALCITON 0.12    No results found for this or any previous visit (from the past 240 hour(s)).  Radiology Studies: Dg Chest Port 1 View  Result Date: 06/02/2019 CLINICAL DATA:  Shortness of breath, COVID-19 positive EXAM: PORTABLE CHEST 1 VIEW COMPARISON:  05/23/2019 FINDINGS: Cardiomegaly. Both lungs are clear. Partially imaged plate and screw fixation of the proximal left humerus. IMPRESSION: Cardiomegaly without acute abnormality of the lungs in AP portable projection. Electronically Signed   By: Eddie Candle M.D.   On: 06/02/2019 16:42   Scheduled Meds: . allopurinol  100 mg Oral Daily  . aspirin EC  81 mg Oral Daily   . buPROPion  200 mg Oral Daily  .  docusate sodium  100 mg Oral BID  . DULoxetine  30 mg Oral BID  . gabapentin  300 mg Oral TID  . insulin aspart  0-15 Units Subcutaneous TID WC  . insulin aspart protamine- aspart  8 Units Subcutaneous BID WC  . levothyroxine  88 mcg Oral QAC breakfast  . loratadine  10 mg Oral QHS  . magnesium oxide  200 mg Oral Daily  . pantoprazole  40 mg Oral QHS  . rosuvastatin  5 mg Oral Daily  . sodium chloride flush  3 mL Intravenous Q12H  . vitamin B-12  500 mcg Oral BID   Continuous Infusions: . sodium chloride 250 mL (05/30/19 2342)  . sodium chloride 75 mL/hr at 06/03/19 0910    LOS: 10 days   Kerney Elbe, DO [facility] PAGER is on AMION  If 7PM-7AM, please contact night-coverage [email or web address] Password [facility] 06/03/2019, 4:53 PM

## 2019-06-04 ENCOUNTER — Inpatient Hospital Stay (HOSPITAL_COMMUNITY): Payer: PPO

## 2019-06-04 LAB — CBC WITH DIFFERENTIAL/PLATELET
Abs Immature Granulocytes: 0.07 10*3/uL (ref 0.00–0.07)
Basophils Absolute: 0 10*3/uL (ref 0.0–0.1)
Basophils Relative: 0 %
Eosinophils Absolute: 0.5 10*3/uL (ref 0.0–0.5)
Eosinophils Relative: 4 %
HCT: 23.4 % — ABNORMAL LOW (ref 36.0–46.0)
Hemoglobin: 7.3 g/dL — ABNORMAL LOW (ref 12.0–15.0)
Immature Granulocytes: 1 %
Lymphocytes Relative: 25 %
Lymphs Abs: 3 10*3/uL (ref 0.7–4.0)
MCH: 29.2 pg (ref 26.0–34.0)
MCHC: 31.2 g/dL (ref 30.0–36.0)
MCV: 93.6 fL (ref 80.0–100.0)
Monocytes Absolute: 0.9 10*3/uL (ref 0.1–1.0)
Monocytes Relative: 7 %
Neutro Abs: 7.8 10*3/uL — ABNORMAL HIGH (ref 1.7–7.7)
Neutrophils Relative %: 63 %
Platelets: 318 10*3/uL (ref 150–400)
RBC: 2.5 MIL/uL — ABNORMAL LOW (ref 3.87–5.11)
RDW: 14.5 % (ref 11.5–15.5)
WBC: 12.3 10*3/uL — ABNORMAL HIGH (ref 4.0–10.5)
nRBC: 0.2 % (ref 0.0–0.2)

## 2019-06-04 LAB — COMPREHENSIVE METABOLIC PANEL
ALT: 17 U/L (ref 0–44)
AST: 29 U/L (ref 15–41)
Albumin: 1.7 g/dL — ABNORMAL LOW (ref 3.5–5.0)
Alkaline Phosphatase: 103 U/L (ref 38–126)
Anion gap: 8 (ref 5–15)
BUN: 29 mg/dL — ABNORMAL HIGH (ref 8–23)
CO2: 23 mmol/L (ref 22–32)
Calcium: 8.3 mg/dL — ABNORMAL LOW (ref 8.9–10.3)
Chloride: 100 mmol/L (ref 98–111)
Creatinine, Ser: 1.5 mg/dL — ABNORMAL HIGH (ref 0.44–1.00)
GFR calc Af Amer: 41 mL/min — ABNORMAL LOW (ref >60)
GFR calc non Af Amer: 35 mL/min — ABNORMAL LOW (ref >60)
Glucose, Bld: 150 mg/dL — ABNORMAL HIGH (ref 70–99)
Potassium: 4.7 mmol/L (ref 3.5–5.1)
Sodium: 131 mmol/L — ABNORMAL LOW (ref 135–145)
Total Bilirubin: 0.5 mg/dL (ref 0.3–1.2)
Total Protein: 6 g/dL — ABNORMAL LOW (ref 6.5–8.1)

## 2019-06-04 LAB — FERRITIN: Ferritin: 110 ng/mL (ref 11–307)

## 2019-06-04 LAB — SEDIMENTATION RATE: Sed Rate: >140 mm/hr — ABNORMAL HIGH (ref 0–22)

## 2019-06-04 LAB — GLUCOSE, CAPILLARY
Glucose-Capillary: 150 mg/dL — ABNORMAL HIGH (ref 70–99)
Glucose-Capillary: 153 mg/dL — ABNORMAL HIGH (ref 70–99)
Glucose-Capillary: 169 mg/dL — ABNORMAL HIGH (ref 70–99)
Glucose-Capillary: 141 mg/dL — ABNORMAL HIGH (ref 70–99)

## 2019-06-04 LAB — CK: Total CK: 191 U/L (ref 38–234)

## 2019-06-04 LAB — PROCALCITONIN: Procalcitonin: 0.11 ng/mL

## 2019-06-04 LAB — C-REACTIVE PROTEIN: CRP: 19.9 mg/dL — ABNORMAL HIGH (ref <1.0)

## 2019-06-04 LAB — PHOSPHORUS: Phosphorus: 4.4 mg/dL (ref 2.5–4.6)

## 2019-06-04 LAB — LACTATE DEHYDROGENASE: LDH: 114 U/L (ref 98–192)

## 2019-06-04 LAB — MAGNESIUM: Magnesium: 1.8 mg/dL (ref 1.7–2.4)

## 2019-06-04 LAB — D-DIMER, QUANTITATIVE: D-Dimer, Quant: 4.83 ug/mL-FEU — ABNORMAL HIGH (ref 0.00–0.50)

## 2019-06-04 MED ORDER — HEPARIN SODIUM (PORCINE) 5000 UNIT/ML IJ SOLN
5000.0000 [IU] | Freq: Three times a day (TID) | INTRAMUSCULAR | Status: DC
  Administered 2019-06-04 – 2019-06-06 (×4): 5000 [IU] via SUBCUTANEOUS
  Filled 2019-06-04 (×5): qty 1

## 2019-06-04 NOTE — Progress Notes (Signed)
PROGRESS NOTE    Alice Kemp  DVV:[id number] DOB: 17-Feb-1951 DOA: 05/24/2019 PCP: [name], MD   Brief Narrative:  Patient is a 68 year old morbidly obese Caucasian female with a past medical history significant for but not limited to anxiety and depression, osteoarthritis, chronic back pain, benign colonic neoplasm, history of left breast abscess, cervical radiculopathy, CKD stage III, constipation, type 2 diabetes mellitus, chronic diastolic CHF, glaucoma, history of gout, hypertension, hyperlipidemia, hypothyroidism, morbid obesity, obstructive sleep apnea, history of pericardial effusion and pulmonary hypertension, vitamin B12 and vitamin D deficiency as well as other comorbidities who presented with worsening low back pain and also found to have abdominal cellulitis.  She is tested positive for COVID but was diagnosed back in June.  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  PT OT evaluated and recommending skilled nursing facility.  Renal Slightly worsened so she was given a 500 mL bolus and started on maintenance IV fluids.  Some inflammatory markers are trending down and states her pain is little bit better today.  Will repeat inflammatory markers as well as a renal function in the a.m. and if renal function, sodium as well as inflammatory markers are improved we will discharge to skilled nursing facility however before we can discharge her will need repeat COVID screening test and this was ordered today.  Assessment & Plan:   Principal Problem:   Cellulitis of abdominal wall Active Problems:   Uncontrolled type 2 diabetes mellitus with stage 3 chronic kidney disease (HCC)   Morbid obesity (HCC)   Hypothyroidism   Chronic diastolic heart failure (HCC)   Essential hypertension   AKI (acute kidney injury) (HCC)   Hyponatremia   Anemia   HNP (herniated nucleus pulposus),  lumbar  Lower back pain with radiculopathy and left foot drop along with left greater than right lower extremity weakness -Associated LE weakness. 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  -S/p lumbar laminectomy and microdiscectomy on 8/24 -Appreciate further Neurosurgery recommendations and evaluation -Continue with acetaminophen 650 mg p.o./RC every 4 PRN for mild pain, methocarbamol 500 mg p.o. every 8 as needed for muscle spasms with oxycodone 10 mg every 6 as needed severe pain -Continue with supportive care and bowel regimen docusate 100 g p.o. twice daily, as well as antiemetics with ondansetron 4 mg p.o./IV every 6 as needed for nausea and vomiting -VTE prophylaxis resumption when okay with neurosurgery and I discussed the the case with Dr. Bennie Hind who recommends 5 days postoperatively; today is day 5 postoperatively so we will restart heparin subcu -PT OT recommending SNF and social work consulted for assistance with placement -Patient apparently has authorization but will need to repeat COVID testing prior to sending her to the skilled nursing facility  Abdominal Wound/Cellulitis, improved  -Improving on antibiotics. Empirically treated with ceftriaxone and transitioned to Keflex today's last day. -WBC went from 12.2 -> 11.7 -> 12.0 -> 12.3 -Continued Cephalexin but now stopped on 8/27 -Continue to monitor   Lethargy, improved  -Secondary to combination of dilaudid and flexeril -Discontinue dilaudid -C/w Pulse oximetry, if worsens, will obtain ABG/give Narcan  Chronic Diastolic Heart Failure -Appeared a little dry and has no Signs of Decompensation -Continue Holding Diuretics and Antihypertensives -Strict I's/O's and Daily Weights; patient is -12.395 L since admission and she is only lost 1 since 05/24/2019  but repeat weights have not been done -Continue to monitor for signs and symptoms  of volume overload she started back on IV fluid hydration yesterday  Diabetes Mellitus Type 2 -Continue Novolog Mix 70/30 8 units sq BIDwm and with Moderate Novolog SSI AC -HbA1c was 8.9 -CBG's ranging from 117-153  Hypothyroidism -TSH was 0.819 -Continue Levothyroxine 88 mcg po Daily   Normocytic Anemia/Anemia of Chronic Kidney Disease  -Unsure of etiology. Possibly secondary to kidney disease. Iron panel obtained and is normal. No obvious source of bleeding -Patient's hemoglobin/hematocrit is relatively stable and was 7.7/24.3 however now is trending downward to 7.3/23.4 in the setting of IV fluid resuscitation -Continue to monitor for signs and symptoms of bleeding; currently no overt bleeding noted -Continue monitor repeat CBC in a.m.  Hyponatremia -Thought to be secondary to diuretic use and GI loses so she was Given IV fluids. IVF had stopped  -Na+ is now 131; continue with IV fluid resuscitation with normal saline at a rate of 75 mL's per hour -Continue to Monitor and Trend -Repeat CMP in AM   Essential Hypertension -Stable. Continuing to Hold Diuretics and ARB -Last blood pressure is 105/56  Hyperlipidemia -Continue Rosuvastatin 5 mg po Daily   GERD/GI Prophylaxis -C/w Pantoprazole 40 mg po qhS  COVID-19 Positive -Patient has been asymptomatic for weeks. Currently on precautions -Has been on room air without hypoxia or respiratory symptoms -Had exposure back in June and I declined testing at that time. -Currently has no active symptoms -Inflammatory markers are elevated and some are trending up and last CRP was 14.9 on 05/28/2019 and repeat as trended up to 23.1; Now trending down and was 19.9 -PCT was 0.12 - LDH is now trended down to 114, ferritin level was 86 and trended up to 110, ESR this morning was again greater than 140, and D-Dimer was 4.01 and trended up to 4.89 and started trending down but is now back up to 4.3 -CXR done yesterday showed cardiomegaly  with out acute abnormality in the lungs and AP portable projection -CK trended up and went from CK went up to 313 hours 991 and LDH also went up from from 102-134 but is now trending back down to 114 -Continue to monitor and trend inflammatory markers -Patient is not requiring oxygen and chest x-ray shows persistent cardiomegaly but no acute findings  Morbid Obesity -Estimated body mass index is 44.5 kg/m as calculated from the following:   Height as of this encounter: '5\' 4"'$  (1.626 m).   Weight as of this encounter: 117.6 kg. -Weight Loss and Dietary Counseling given   AKI on CKD Stage 3, improving somewhat -Was worsening slightly; patient's BUN/creatinine went from 25/1.43 is now trended up to 31/1.63; repeat this afternoon showed slight improvement to 29/1.50 we will continue IV fluid hydration again today -Given 500 mL bolus the day before yesterday and started on normal saline 75 mL's per hour and this is improving -Continue to Monitor Strict I's/O's -Nephrotoxic medications, contrast dyes, hypotension if possible -Continue monitor and trend renal function  -Repeat CMP in the a.m.  Anxiety and Depression -Continue with Amitriptyline 10 mg p.o. nightly as needed for sleep, Bupropion 200 mg p.o. daily  Gout -Continue with Allopurinol 100 mg p.o. daily  Hypomagnesemia  -Patient's magnesium level is 1.8 -Continue monitor replete as necessary -Repeat magnesium level in the a.m.  DVT prophylaxis: SCDs but will need to go back on VTE Prophylaxis when ok with Neurosurgery.  Code Status: FULL CODE Family Communication: No  family present at bedside Disposition Plan: SNF  Consultants:   Neurosurgery Dr. Kary Kos   Procedures:  Complete decompressive laminectomy L3 partial inferior laminectomy L2.  With microscopic discectomy from the left and foraminotomies of the left L3 nerve root.   Antimicrobials:  Anti-infectives (From admission, onward)   Start     Dose/Rate Route  Frequency Ordered Stop   05/31/19 0000  ceFAZolin (ANCEF) IVPB 2g/100 mL premix     2 g 200 mL/hr over 30 Minutes Intravenous Every 8 hours 05/30/19 2312 05/31/19 0845   05/30/19 1657  bacitracin 50,000 Units in sodium chloride 0.9 % 500 mL irrigation  Status:  Discontinued       As needed 05/30/19 1658 05/30/19 1840   05/26/19 2200  cephALEXin (KEFLEX) capsule 500 mg     500 mg Oral Every 12 hours 05/26/19 1618 06/02/19 2359   05/24/19 2000  meropenem (MERREM) 1 g in sodium chloride 0.9 % 100 mL IVPB  Status:  Discontinued     1 g 200 mL/hr over 30 Minutes Intravenous Every 12 hours 05/24/19 0611 05/24/19 1303   05/24/19 1600  cefTRIAXone (ROCEPHIN) 2 g in sodium chloride 0.9 % 100 mL IVPB  Status:  Discontinued     2 g 200 mL/hr over 30 Minutes Intravenous Every 24 hours 05/24/19 1303 05/26/19 1618   05/24/19 0630  vancomycin (VANCOCIN) 2,000 mg in sodium chloride 0.9 % 500 mL IVPB     2,000 mg 250 mL/hr over 120 Minutes Intravenous  Once 05/24/19 0554 05/24/19 0859   05/24/19 0611  vancomycin variable dose per unstable renal function (pharmacist dosing)  Status:  Discontinued      Does not apply See admin instructions 05/24/19 0611 05/24/19 1303   05/24/19 0600  meropenem (MERREM) 1 g in sodium chloride 0.9 % 100 mL IVPB     1 g 200 mL/hr over 30 Minutes Intravenous  Once 05/24/19 0545 05/24/19 0640     Subjective: Seen and examined at bedside this morning states that she is feeling stronger and little bit better.  Pain is better controlled.  Sister thinks she has more motion in it.  No chest pain, lightheadedness or dizziness but no other concerns reported at this time.  Needs a repeat COVID test before going to skilled nursing facility and this was being done today.  Objective: Vitals:   06/04/19 0300 06/04/19 0758 06/04/19 1142 06/04/19 1533  BP:  99/64 (!) 110/56 (!) 105/56  Pulse:  85 82 82  Resp:  '16 18 18  '$ Temp:  99.1 F (37.3 C) 98.8 F (37.1 C) 99.1 F (37.3 C)    TempSrc:  Oral Oral Oral  SpO2:  91% 92% 91%  Weight: 117.6 kg     Height:        Intake/Output Summary (Last 24 hours) at 06/04/2019 1709 Last data filed at 06/04/2019 1500 Gross per 24 hour  Intake 2557.07 ml  Output 1700 ml  Net 857.07 ml   Filed Weights   06/02/19 0500 06/03/19 0321 06/04/19 0300  Weight: 116.8 kg 117.1 kg 117.6 kg   Examination: Physical Exam:  Constitutional: Well-nourished, well-developed morbidly obese Caucasian female currently no acute distress appears pleasant and feels that she is improving. Eyes: Lids and conjunctive are normal.  Sclerae anicteric ENMT: External ears and nose appear normal.  Grossly normal hearing Neck: Neck appears supple with no JVD Respiratory: Mildly diminished to auscultation bilaterally with no appreciable wheezing, rales, rhonchi.  Patient is not tachypneic or using  any accessory muscles to breathe. Cardiovascular: Regular rate and rhythm.  No appreciable murmurs, rubs, gallops.  No extremity edema noted Abdomen: Soft, nontender, distended secondary to body habitus and she has a very large pannus.  Backslash present.  Has cellulitic area and crusted lesion in the right lower quadrant GU: Deferred Musculoskeletal: No contractures cyanosis.  No joint deformities in upper lower extremities Skin: Has some cellulitic area on her abdomen on the right lower quadrant which is noticed erythematous and improved and crusting over Neurologic: Cranial nerves II through XII grossly intact with no appreciable focal deficits.  Continues to have some weakness in her legs more so in her left compared to the right Psychiatric: Normal mood and affect.  Intact judgment site.  Patient is awake, alert, oriented  Data Reviewed: I have personally reviewed following labs and imaging studies  CBC: Recent Labs  Lab 05/29/19 0546 06/01/19 0538 06/02/19 0842 06/03/19 0549 06/04/19 0552  WBC 9.5 12.2* 11.7* 12.0* 12.3*  NEUTROABS 4.3  --  7.7 6.5  7.8*  HGB 8.4* 7.8* 7.9* 7.7* 7.3*  HCT 27.0* 24.5* 24.5* 24.3* 23.4*  MCV 96.1 93.5 92.1 92.4 93.6  PLT 271 304 326 309 644   Basic Metabolic Panel: Recent Labs  Lab 05/29/19 0546 06/01/19 0538 06/02/19 0842 06/03/19 0549 06/03/19 1415 06/04/19 0552  NA 137 132* 133* 132* 131* 131*  K 4.9 4.7 4.6 4.6 4.7 4.7  CL 105 101 97* 99 96* 100  CO2 '23 23 25 22 25 23  '$ GLUCOSE 130* 173* 186* 142* 177* 150*  BUN 32* 27* 25* 31* 30* 29*  CREATININE 1.52* 1.50* 1.43* 1.63* 1.56* 1.50*  CALCIUM 8.9 8.6* 8.6* 8.5* 8.2* 8.3*  MG 1.9  --  1.5* 1.7  --  1.8  PHOS 4.7*  --  3.7 4.7*  --  4.4   GFR: Estimated Creatinine Clearance: 45.3 mL/min (A) (by C-G formula based on SCr of 1.5 mg/dL (H)). Liver Function Tests: Recent Labs  Lab 05/29/19 0546 06/02/19 0842 06/03/19 0549 06/04/19 0552  AST  --  '18 31 29  '$ ALT  --  '11 14 17  '$ ALKPHOS  --  74 84 103  BILITOT  --  0.6 0.5 0.5  PROT  --  6.3* 6.2* 6.0*  ALBUMIN 2.3* 1.8* 1.8* 1.7*   No results for input(s): LIPASE, AMYLASE in the last 168 hours. No results for input(s): AMMONIA in the last 168 hours. Coagulation Profile: No results for input(s): INR, PROTIME in the last 168 hours. Cardiac Enzymes: Recent Labs  Lab 06/02/19 0842 06/03/19 0549 06/04/19 0552  CKTOTAL 51 313* 191   BNP (last 3 results) No results for input(s): PROBNP in the last 8760 hours. HbA1C: No results for input(s): HGBA1C in the last 72 hours. CBG: Recent Labs  Lab 06/03/19 1148 06/03/19 1554 06/03/19 2039 06/04/19 0815 06/04/19 1136  GLUCAP 197* 144* 117* 150* 153*   Lipid Profile: No results for input(s): CHOL, HDL, LDLCALC, TRIG, CHOLHDL, LDLDIRECT in the last 72 hours. Thyroid Function Tests: No results for input(s): TSH, T4TOTAL, FREET4, T3FREE, THYROIDAB in the last 72 hours. Anemia Panel: Recent Labs    06/03/19 0549 06/04/19 0552  FERRITIN 114 110   Sepsis Labs: Recent Labs  Lab 06/03/19 0549 06/04/19 0552  PROCALCITON 0.12 0.11     No results found for this or any previous visit (from the past 240 hour(s)).  Radiology Studies: Dg Chest Port 1 View  Result Date: 06/04/2019 CLINICAL DATA:  Shortness of breath  EXAM: PORTABLE CHEST 1 VIEW COMPARISON:  06/02/2019 FINDINGS: Cardiomegaly appears the same. Aortic atherosclerosis again noted. The lungs remain clear. No pulmonary edema. No effusion. IMPRESSION: Persistent cardiomegaly.  Otherwise negative. Electronically Signed   By: Nelson Chimes M.D.   On: 06/04/2019 09:03   Scheduled Meds:  allopurinol  100 mg Oral Daily   aspirin EC  81 mg Oral Daily   buPROPion  200 mg Oral Daily   docusate sodium  100 mg Oral BID   DULoxetine  30 mg Oral BID   gabapentin  300 mg Oral TID   insulin aspart  0-15 Units Subcutaneous TID WC   insulin aspart protamine- aspart  8 Units Subcutaneous BID WC   levothyroxine  88 mcg Oral QAC breakfast   loratadine  10 mg Oral QHS   magnesium oxide  200 mg Oral Daily   pantoprazole  40 mg Oral QHS   rosuvastatin  5 mg Oral Daily   sodium chloride flush  3 mL Intravenous Q12H   vitamin B-12  500 mcg Oral BID   Continuous Infusions:  sodium chloride 250 mL (05/30/19 2342)   sodium chloride 75 mL/hr at 06/04/19 0500    LOS: 11 days   Kerney Elbe, DO [facility] PAGER is on AMION  If 7PM-7AM, please contact night-coverage [email or web address] Password [email or web address] 06/04/2019, 5:09 PM

## 2019-06-05 LAB — NOVEL CORONAVIRUS, NAA (HOSP ORDER, SEND-OUT TO REF LAB; TAT 18-24 HRS): SARS-CoV-2, NAA: NOT DETECTED

## 2019-06-05 LAB — COMPREHENSIVE METABOLIC PANEL
ALT: 17 U/L (ref 0–44)
AST: 23 U/L (ref 15–41)
Albumin: 1.7 g/dL — ABNORMAL LOW (ref 3.5–5.0)
Alkaline Phosphatase: 114 U/L (ref 38–126)
Anion gap: 12 (ref 5–15)
BUN: 24 mg/dL — ABNORMAL HIGH (ref 8–23)
CO2: 22 mmol/L (ref 22–32)
Calcium: 8.5 mg/dL — ABNORMAL LOW (ref 8.9–10.3)
Chloride: 99 mmol/L (ref 98–111)
Creatinine, Ser: 1.36 mg/dL — ABNORMAL HIGH (ref 0.44–1.00)
GFR calc Af Amer: 46 mL/min — ABNORMAL LOW (ref >60)
GFR calc non Af Amer: 40 mL/min — ABNORMAL LOW (ref >60)
Glucose, Bld: 149 mg/dL — ABNORMAL HIGH (ref 70–99)
Potassium: 4.7 mmol/L (ref 3.5–5.1)
Sodium: 133 mmol/L — ABNORMAL LOW (ref 135–145)
Total Bilirubin: 0.7 mg/dL (ref 0.3–1.2)
Total Protein: 6.2 g/dL — ABNORMAL LOW (ref 6.5–8.1)

## 2019-06-05 LAB — CBC WITH DIFFERENTIAL/PLATELET
Abs Immature Granulocytes: 0.19 10*3/uL — ABNORMAL HIGH (ref 0.00–0.07)
Basophils Absolute: 0 10*3/uL (ref 0.0–0.1)
Basophils Relative: 0 %
Eosinophils Absolute: 0.4 10*3/uL (ref 0.0–0.5)
Eosinophils Relative: 3 %
HCT: 23.4 % — ABNORMAL LOW (ref 36.0–46.0)
Hemoglobin: 7.4 g/dL — ABNORMAL LOW (ref 12.0–15.0)
Immature Granulocytes: 2 %
Lymphocytes Relative: 22 %
Lymphs Abs: 2.8 10*3/uL (ref 0.7–4.0)
MCH: 28.7 pg (ref 26.0–34.0)
MCHC: 31.6 g/dL (ref 30.0–36.0)
MCV: 90.7 fL (ref 80.0–100.0)
Monocytes Absolute: 0.9 10*3/uL (ref 0.1–1.0)
Monocytes Relative: 7 %
Neutro Abs: 8.4 10*3/uL — ABNORMAL HIGH (ref 1.7–7.7)
Neutrophils Relative %: 66 %
Platelets: 364 10*3/uL (ref 150–400)
RBC: 2.58 MIL/uL — ABNORMAL LOW (ref 3.87–5.11)
RDW: 14.5 % (ref 11.5–15.5)
WBC: 12.7 10*3/uL — ABNORMAL HIGH (ref 4.0–10.5)
nRBC: 0 % (ref 0.0–0.2)

## 2019-06-05 LAB — PHOSPHORUS: Phosphorus: 3.7 mg/dL (ref 2.5–4.6)

## 2019-06-05 LAB — SEDIMENTATION RATE: Sed Rate: 139 mm/hr — ABNORMAL HIGH (ref 0–22)

## 2019-06-05 LAB — LACTATE DEHYDROGENASE: LDH: 132 U/L (ref 98–192)

## 2019-06-05 LAB — CK: Total CK: 74 U/L (ref 38–234)

## 2019-06-05 LAB — GLUCOSE, CAPILLARY
Glucose-Capillary: 138 mg/dL — ABNORMAL HIGH (ref 70–99)
Glucose-Capillary: 105 mg/dL — ABNORMAL HIGH (ref 70–99)
Glucose-Capillary: 154 mg/dL — ABNORMAL HIGH (ref 70–99)
Glucose-Capillary: 115 mg/dL — ABNORMAL HIGH (ref 70–99)

## 2019-06-05 LAB — C-REACTIVE PROTEIN: CRP: 20.8 mg/dL — ABNORMAL HIGH (ref <1.0)

## 2019-06-05 LAB — MAGNESIUM: Magnesium: 1.7 mg/dL (ref 1.7–2.4)

## 2019-06-05 LAB — FERRITIN: Ferritin: 130 ng/mL (ref 11–307)

## 2019-06-05 LAB — D-DIMER, QUANTITATIVE: D-Dimer, Quant: 5.18 ug/mL-FEU — ABNORMAL HIGH (ref 0.00–0.50)

## 2019-06-05 LAB — FIBRINOGEN: Fibrinogen: >800 mg/dL — ABNORMAL HIGH (ref 210–475)

## 2019-06-05 MED ORDER — BISACODYL 10 MG RE SUPP: 10.0000 mg | Freq: Every day | RECTAL | 0 refills | Status: DC | PRN

## 2019-06-05 MED ORDER — POLYETHYLENE GLYCOL 3350 17 G PO PACK: 17.0000 g | PACK | Freq: Every day | ORAL | 0 refills | Status: DC | PRN

## 2019-06-05 MED ORDER — BISACODYL 10 MG RE SUPP: 10.0000 mg | Freq: Every day | RECTAL | Status: DC | PRN

## 2019-06-05 MED ORDER — POLYETHYLENE GLYCOL 3350 17 G PO PACK
17.0000 g | PACK | Freq: Two times a day (BID) | ORAL | Status: DC
  Administered 2019-06-05 – 2019-06-06 (×3): 17 g via ORAL
  Filled 2019-06-05 (×3): qty 1

## 2019-06-05 MED ORDER — PANTOPRAZOLE SODIUM 40 MG PO TBEC: 40.0000 mg | DELAYED_RELEASE_TABLET | Freq: Every day | ORAL | 0 refills | Status: DC

## 2019-06-05 MED ORDER — SENNOSIDES-DOCUSATE SODIUM 8.6-50 MG PO TABS: 1.0000 | ORAL_TABLET | Freq: Two times a day (BID) | ORAL | Status: DC

## 2019-06-05 MED ORDER — ONDANSETRON HCL 4 MG PO TABS: 4.0000 mg | ORAL_TABLET | Freq: Four times a day (QID) | ORAL | 0 refills | Status: DC | PRN

## 2019-06-05 MED ORDER — SENNOSIDES-DOCUSATE SODIUM 8.6-50 MG PO TABS
1.0000 | ORAL_TABLET | Freq: Two times a day (BID) | ORAL | Status: DC
  Administered 2019-06-05 – 2019-06-06 (×3): 1 via ORAL
  Filled 2019-06-05 (×3): qty 1

## 2019-06-05 MED ORDER — METHOCARBAMOL 500 MG PO TABS: 500.0000 mg | ORAL_TABLET | Freq: Three times a day (TID) | ORAL | 0 refills | Status: DC | PRN

## 2019-06-05 MED ORDER — BISACODYL 10 MG RE SUPP
10.0000 mg | Freq: Once | RECTAL | Status: AC
  Administered 2019-06-05: 10 mg via RECTAL
  Filled 2019-06-05: qty 1

## 2019-06-05 MED ORDER — OXYCODONE HCL 10 MG PO TABS: 10.0000 mg | ORAL_TABLET | Freq: Four times a day (QID) | ORAL | 0 refills | Status: DC | PRN

## 2019-06-05 NOTE — TOC Transition Note (Addendum)
Transition of Care [facility]) - CM/SW Discharge Note   Patient Details  Name: Alice Kemp MRN: [id number] Date of Birth: 22-Nov-1950  [facility] of [facility]) CM/SW Contact:  Geralynn Ochs, LCSW Phone Number: 06/05/2019, 12:06 PM   Clinical Narrative:   Nurse to call report to [phone], Room 803B. Ask for [facility] nurse station.  Transport to pickup at 1:00 PM.    Final next level of care: [place] Barriers to Discharge: Barriers Resolved   Patient Goals and CMS Choice        Discharge Placement              Patient chooses bed at: [facility] Patient to be transferred to facility by: [place] Name of family member notified: Self Patient and family notified of of transfer: 06/05/19  Discharge Plan and Services                                     Social Determinants of Health (SDOH) Interventions     Readmission Risk Interventions No flowsheet data found.

## 2019-06-05 NOTE — TOC Progression Note (Signed)
Transition of Care [facility]) - Progression Note    Patient Details  Name: ANEITA ARBELO MRN: [id number] Date of Birth: 1950-11-11  Transition of Care [facility]) CM/SW [place], [place] Phone Number: 06/05/2019, 1:20 PM  Clinical Narrative:   CSW had completed patient discharge to [place] noticed that patient's PASRR was not completed. Patient's PASRR remains in manual review, a signed FL2 was never sent in by previous RNCM. CSW updated MD about patient being unable to discharge today. CSW sent signed FL2 in to [facility] for review. Patient can discharge tomorrow to [facility] after PASRR received. CSW updated RN.      Barriers to Discharge: [place] [facility])  Expected Discharge Plan and Services           Expected Discharge Date: 06/05/19                                     Social Determinants of Health (SDOH) Interventions    Readmission Risk Interventions No flowsheet data found.

## 2019-06-05 NOTE — Discharge Summary (Addendum)
Physician Discharge Summary  Alice Kemp UVO:[id number] DOB: 11/28/1950 DOA: 05/24/2019  PCP: [name], MD  Admit date: 05/24/2019 Discharge date: 06/05/2019  Admitted From: Home Disposition: SNF  Recommendations for Outpatient Follow-up:  1. Follow up with PCP in 1-2 weeks 2. Follow-up with Neurosurgery Dr. Kary Kos within 1 to 2 weeks 3. Please obtain CMP/CBC, Mag, Phos in one week 4. Please follow up on the following pending results:  Home Health: No Equipment/Devices: TBD by next venue   Discharge Condition: Stable  CODE STATUS: FULL CODE Diet recommendation: Heart Healthy Carb Modified Diet  Brief/Interim Summary: Patient is a 68 year old morbidly obese Caucasian female with a past medical history significant for but not limited to anxiety and depression, osteoarthritis, chronic back pain, benign colonic neoplasm, history of left breast abscess, cervical radiculopathy, CKD stage III, constipation, type 2 diabetes mellitus, chronic diastolic CHF, glaucoma, history of gout, hypertension, hyperlipidemia, hypothyroidism, morbid obesity, obstructive sleep apnea, history of pericardial effusion and pulmonary hypertension, vitamin B12 and vitamin D deficiency as well as other comorbidities who presented with worsening low back pain and also found to have abdominal cellulitis.  She is tested positive for COVID but was diagnosed back in June.  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  PT OT evaluated and recommending skilled nursing facility.  Renal function had Slightly worsened so she was given a 500 mL bolus and started on maintenance IV fluids.  Some inflammatory markers are trending down and states her pain is little bit better today.    Since her inflammatory markers are stable and likely secondary to her recent surgery and cellulitic infection as opposed to COVID-19 disease  and renal function is improved she is deemed stable for discharge will need to follow-up with PCP as well as neurosurgeon outpatient setting.  Initially it was thought that she needed repeat cover testing prior to discharge to skilled nursing facility however I spoke with social worker she states that is not necessary patient can be discharged today.  Patient is stable for discharge to skilled nursing facility for continued rehabilitation.  **ADDENDUM 06/06/2019: Patient remained stable overnight in renal function improved further.  Unfortunately she did not go yesterday as she did not have her PASRR number.  Today she has had an denies any current complaints.  Agreeable to going to rehab before going home because she states that she is too weak and cannot walk.  Denies any back pain today well controlled.  Denies any nausea vomiting shortness breath, burning or discomfort in her urine.  Inflammatory markers are still slightly elevated but likely attributable to her recent surgery as well as recent abdominal wall cellulitis as she has no clear-cut evidence of being symptomatic from covered.  She is deemed stable for discharge to skilled nursing facility and she will need follow-up with PCP as well as Neurosurgery Dr. Saintclair Halsted within the next few weeks.  Discharge Diagnoses:  Principal Problem:   Cellulitis of abdominal wall Active Problems:   Uncontrolled type 2 diabetes mellitus with stage 3 chronic kidney disease (HCC)   Morbid obesity (HCC)   Hypothyroidism   Chronic diastolic heart failure (HCC)   Essential hypertension   AKI (acute kidney injury) (HCC)   Hyponatremia   Anemia   HNP (herniated nucleus pulposus), lumbar  Lower back pain with radiculopathy and left foot drop along with left greater than right lower extremity weakness -Associated  LE weakness. 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 -S/p lumbar  laminectomy and microdiscectomy on 8/24 -Appreciate further Neurosurgery recommendations and evaluation -Continue with acetaminophen 650 mg p.o./RC every 4 PRN for mild pain, methocarbamol 500 mg p.o. every 8 as needed for muscle spasms with oxycodone 10 mg every 6 as needed severe pain -Continue with supportive care and bowel regimen docusate 100 g p.o. twice daily, as well as antiemetics with ondansetron 4 mg p.o./IV every 6 as needed for nausea and vomiting -VTE prophylaxis resumption when okay with neurosurgery and I discussed the the case with Dr. Bennie Hind who recommends 5 days; heparin subcu yesterday was resumed postoperatively; today is day 5 postoperatively so we will restart heparin subcu -PT OT recommending SNF and social work consulted for assistance with placement -Patient apparently has authorization but will need to repeat COVID testing prior to sending her to the skilled nursing facility  Abdominal Wound/Cellulitis, improved  -Improving on antibiotics. Empirically treated with ceftriaxone and transitioned to Keflex today's last day. -WBC went from 12.2 -> 11.7 -> 12.0 -> 12.3 -> 12.7 -> 12.9 and is relatively stable -Continued Cephalexin but now stopped on 8/27 -Continue to monitor for signs and symptoms of infection in outpatient setting and repeat CBC within 1 week of discharge and will need to continue to monitor for signs and symptoms of infection  Lethargy, improved  -Secondary to combination of dilaudid and flexeril -Discontinue dilaudid -C/w Pulse oximetry, if worsens, will obtain ABG/give Narcan  Chronic Diastolic Heart Failure -Appeared a little dry and has no Signs of Decompensation -Continued Holding Diuretics and Antihypertensives while she was hospitalized but likely can be discharged back on them with close follow-up -Strict I's/O's and Daily Weights; patient is -11.090 L since admission and Weight is +3 Lbs since 05/24/2019 -Continue to monitor for signs  and symptoms of volume overload she started back on IV fluid hydration yesterday  Diabetes Mellitus Type 2 -Continue Novolog Mix 70/30 8 units sq BIDwm and with Moderate Novolog SSI AC -HbA1c was 8.9 -CBG's ranging from 105-168  Hypothyroidism -TSH was 0.819 -Continue Levothyroxine 88 mcg po Daily   Normocytic Anemia/Anemia of Chronic Kidney Disease  -Unsure of etiology. Possibly secondary to kidney disease. Iron panel obtained and is normal. No obvious source of bleeding -Patient's hemoglobin/hematocrit is relatively stable and is now 7.7/24.1 -Continue to monitor for signs and symptoms of bleeding; currently no overt bleeding noted -Continue monitor repeat CBC in a.m.  Hyponatremia, improving -Thought to be secondary to diuretic use and GI loses so she was Given IV fluids. IVF had stopped  -Na+ is now 133; continued with IV fluid resuscitation with normal saline at a rate of 75 mL's per hour yesterday but stopped subsequently after she lost IV Access -Continue to Monitor and Trend -Repeat CMP as an outpatient   Essential Hypertension -Stable. Continuing to Hold Diuretics and ARB while hospitalized -Last blood pressure is  127/63 and likely her antihypertensives can be resumed at discharge  Hyperlipidemia -Continue Rosuvastatin 5 mg po Daily   GERD/GI Prophylaxis -C/w Pantoprazole 40 mg po qhS  COVID-19 Positive but currently no symptoms -Patient has been asymptomatic for weeks. Currently on precautions -Has been on room air without hypoxia or respiratory symptoms -Had exposure back in June and  declined testing at that time. -Currently has no active symptoms -Inflammatory markers are elevated but can be explained by alternate diagnosis of recent surgery and cellulitis.   -  CRP was trending down but had a slight bump to 20.8 with repeat level this AM pending  -PCT was 0.12 and repeat was 0.11 - LDH is now trended down to 132, ferritin level was 86 and trended up to  130 and is now 141, ESR was again >140, and D-Dimer trended up to 5.18 and is now 4.20 -CXR done yesterday showed no acute abnormalities but did show persistent cardiomegaly -CK trended up and went from CK went up up to 313 and then trended down to 74 and is now 275 and LDH also went up from from 102 -> 134 but is now trending back down to 120 -Fibrinogen is >800 x2 -Continue to monitor and trend inflammatory markers as an outpatient  -Patient is not requiring oxygen and chest x-ray shows persistent cardiomegaly but no acute findings; -Repeat testing was Negative for SARS-CoV-2. -Repeat CXR as an outpatient   Morbid Obesity -Estimated body mass index is 44.58 kg/m as calculated from the following:   Height as of this encounter: _0  (1.626 m).   Weight as of this encounter: 117.8 kg. -Weight Loss and Dietary Counseling given   AKI on CKD Stage 3, improving somewhat -BUN/creatinine improved to 22/1.33 with IV fluid resuscitation with normal saline at 75 mL's per hour but now stopped  -Continue to Monitor Strict I's/O's -Nephrotoxic medications, contrast dyes, hypotension if possible; can resume home antihypertensives in outpatient setting at SNF -Continue monitor and trend renal function  -Repeat CMP in the a.m.  Anxiety and Depression -Continue with Amitriptyline 10 mg p.o. nightly as needed for sleep, Bupropion 200 mg p.o. daily  Gout -Continue with Allopurinol 100 mg p.o. daily  Hypomagnesemia  -Patient's magnesium level is 1.7 -Continue monitor replete as necessary -Repeat magnesium level as an outpatient in the skilled nursing facility  Discharge Instructions  Discharge Instructions    Call MD for:  difficulty breathing, headache or visual disturbances   Complete by: As directed    Call MD for:  extreme fatigue   Complete by: As directed    Call MD for:  hives   Complete by: As directed    Call MD for:  persistant dizziness or light-headedness   Complete by: As  directed    Call MD for:  persistant nausea and vomiting   Complete by: As directed    Call MD for:  redness, tenderness, or signs of infection (pain, swelling, redness, odor or green/yellow discharge around incision site)   Complete by: As directed    Call MD for:  severe uncontrolled pain   Complete by: As directed    Call MD for:  temperature >100.4   Complete by: As directed    Diet - low sodium heart healthy   Complete by: As directed    Diet Carb Modified   Complete by: As directed    Discharge instructions   Complete by: As directed    You were cared for by a hospitalist during your hospital stay. If you have any questions about your discharge medications or the care you received while you were in the hospital after you are discharged, you can call the unit and ask to speak with the hospitalist on call if the hospitalist that took care of you is not available. Once you are discharged, your primary care physician will handle any further medical issues. Please note that NO REFILLS for any discharge medications will be authorized once you are discharged, as it is imperative that you return to your  primary care physician (or establish a relationship with a primary care physician if you do not have one) for your aftercare needs so that they can reassess your need for medications and monitor your lab values.  Follow up with PCP and Neurosurgery as an outpatient Take all medications as prescribed. If symptoms change or worsen please return to the ED for evaluation   Increase activity slowly   Complete by: As directed      Allergies as of 06/05/2019      Reactions   Morphine And Related Other (See Comments)   Behavioral Issues   Sulfa Antibiotics    Penicillins Rash   Occurred as infant      Medication List    STOP taking these medications   Coenzyme Q10 300 MG Caps Commonly known as: EQL CoQ10   traMADol 50 MG tablet Commonly known as: ULTRAM     TAKE these medications    allopurinol 100 MG tablet Commonly known as: ZYLOPRIM TAKE 1 TABLET BY MOUTH ONCE DAILY What changed:   when to take this  reasons to take this   amitriptyline 10 MG tablet Commonly known as: ELAVIL Take 10 mg by mouth at bedtime.   aspirin 81 MG tablet Take 81 mg by mouth daily. Reported on 12/24/2015   benzonatate 100 MG capsule Commonly known as: TESSALON Take 1 capsule (100 mg total) by mouth 3 (three) times daily as needed for cough.   bisacodyl 10 MG suppository Commonly known as: DULCOLAX Place 1 suppository (10 mg total) rectally daily as needed for moderate constipation.   buPROPion 200 MG 12 hr tablet Commonly known as: Wellbutrin SR Take 1 tablet (200 mg total) by mouth daily.   celecoxib 100 MG capsule Commonly known as: CELEBREX Take 1 capsule (100 mg total) by mouth daily as needed. What changed:   how much to take  how to take this  when to take this  reasons to take this  additional instructions   colchicine 0.6 MG tablet TAKE 2 TABLETS BY MOUTH AT ONSET OF FLARE THEN TAKE 1 TABLET 1 HOUR LATER What changed: See the new instructions.   DULoxetine 30 MG capsule Commonly known as: Cymbalta Take 1 capsule (30 mg total) by mouth 2 (two) times daily.   fluticasone 0.05 % cream Commonly known as: CUTIVATE APPLY 3 TIMES A DAY AS NEEDED FOR ITCHING What changed: See the new instructions.   furosemide 80 MG tablet Commonly known as: LASIX Take 1 tablet (80 mg total) by mouth daily.   gabapentin 300 MG capsule Commonly known as: NEURONTIN Take 300 mg by mouth 3 (three) times daily.   levothyroxine 88 MCG tablet Commonly known as: SYNTHROID Take 88 mcg by mouth daily before breakfast.   losartan 25 MG tablet Commonly known as: COZAAR Take 1 tablet (25 mg total) by mouth daily. -- Office visit needed for further refills   Magnesium 100 MG Caps Take 2 capsules by mouth daily.   meclizine 25 MG tablet Commonly known as: ANTIVERT Take  0.5-1 tablets (12.5-25 mg total) by mouth 3 (three) times daily as needed for dizziness.   methocarbamol 500 MG tablet Commonly known as: ROBAXIN Take 1 tablet (500 mg total) by mouth every 8 (eight) hours as needed for muscle spasms.   mupirocin ointment 2 % Commonly known as: BACTROBAN Apply 1 application topically 2 (two) times daily. What changed:   when to take this  reasons to take this   NovoLOG Mix 70/30 FlexPen (70-30)  100 UNIT/ML FlexPen Generic drug: insulin aspart protamine - aspart Inject 55-80 Units into the skin 3 (three) times daily. 80 units breakfast, 55 units lunch, 55 units dinner   ondansetron 4 MG tablet Commonly known as: ZOFRAN Take 1 tablet (4 mg total) by mouth every 6 (six) hours as needed for nausea or vomiting.   OneTouch Verio test strip Generic drug: glucose blood Check BS twice daily   Oxycodone HCl 10 MG Tabs Take 1 tablet (10 mg total) by mouth every 6 (six) hours as needed for severe pain.   pantoprazole 40 MG tablet Commonly known as: PROTONIX Take 1 tablet (40 mg total) by mouth at bedtime.   polyethylene glycol 17 g packet Commonly known as: MIRALAX / GLYCOLAX Take 17 g by mouth daily as needed.   rosuvastatin 5 MG tablet Commonly known as: Crestor Take 1 tablet (5 mg total) by mouth daily.   senna-docusate 8.6-50 MG tablet Commonly known as: Senokot-S Take 1 tablet by mouth 2 (two) times daily.   spironolactone 25 MG tablet Commonly known as: ALDACTONE TAKE 1 TABLET (25 MG TOTAL) BY MOUTH DAILY.   vitamin B-12 500 MCG tablet Commonly known as: CYANOCOBALAMIN Take 500 mcg by mouth 3 (three) times a week.       Allergies  Allergen Reactions  . Morphine And Related Other (See Comments)    Behavioral Issues  . Sulfa Antibiotics   . Penicillins Rash    Occurred as infant   Consultations:  Neurosurgery  Procedures/Studies: Mr Lumbar Spine Wo Contrast  Result Date: 05/28/2019 CLINICAL DATA:  Bilateral lower  extremity weakness with back pain EXAM: MRI LUMBAR SPINE WITHOUT CONTRAST TECHNIQUE: Multiplanar, multisequence MR imaging of the lumbar spine was performed. No intravenous contrast was administered. COMPARISON:  Lumbar radiography 05/23/2019 FINDINGS: Segmentation:  5 lumbar type vertebrae Alignment:  L4-5 grade 1 anterolisthesis. Vertebrae: No fracture, evidence of discitis, or bone lesion. Discogenic endplate edema at O1-6. Conus medullaris and cauda equina: Conus extends to the L1 level. Conus and cauda equina appear normal. Paraspinal and other soft tissues: No acute finding Disc levels: T12- L1: Advanced disc narrowing with right eccentric bulging. Degenerative cyst along the right ligamentum flavum without apparent cord impingement. The foramina remain patent. L1-L2: Unremarkable. L2-L3: Disc narrowing and bulging. There is amorphous material within the spinal canal at L2-3 to the mid L3 body. Posterior element hypertrophy with patent spinal canal. L3-L4: Disc narrowing and circumferential bulging with annular fissure. Facet hypertrophy. Advanced spinal stenosis. Patent foramina L4-L5: Advanced facet arthropathy with spurring and anterolisthesis. The disc is narrowed and bulging, greater towards the right where there is a herniation. High-grade spinal stenosis and right foraminal impingement. L5-S1:Facet spurring with left-sided joint effusion. Mild disc narrowing and bulging with annular fissure. No impingement Motion degraded, especially axial images. IMPRESSION: 1. 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 Postcontrast imaging may be contributory. 2. L3-4 and L4-5 advanced degenerative spinal stenosis. 3. L4-5 right foraminal impingement. 4. Moderate motion degradation. Electronically Signed   By: Monte Fantasia M.D.   On: 05/28/2019 22:28   Dg Lumbar Spine 1 View  Result Date: 05/30/2019 CLINICAL DATA:   L3-4 decompressive laminectomy. Intraoperative localization. EXAM: PORTABLE INTRAOPERATIVE LUMBAR SPINE - 1 VIEW COMPARISON:  05/23/2019. FINDINGS: Image obtained at 4:44 p.m. and submitted for interpretation postoperatively demonstrates the localizer device POSTERIOR to the L3 vertebral body. IMPRESSION: L3 localized intraoperatively. Electronically Signed  By: Evangeline Dakin M.D.   On: 05/30/2019 19:05   Dg Chest Port 1 View  Result Date: 06/04/2019 CLINICAL DATA:  Shortness of breath EXAM: PORTABLE CHEST 1 VIEW COMPARISON:  06/02/2019 FINDINGS: Cardiomegaly appears the same. Aortic atherosclerosis again noted. The lungs remain clear. No pulmonary edema. No effusion. IMPRESSION: Persistent cardiomegaly.  Otherwise negative. Electronically Signed   By: Nelson Chimes M.D.   On: 06/04/2019 09:03   Dg Chest Port 1 View  Result Date: 06/02/2019 CLINICAL DATA:  Shortness of breath, COVID-19 positive EXAM: PORTABLE CHEST 1 VIEW COMPARISON:  05/23/2019 FINDINGS: Cardiomegaly. Both lungs are clear. Partially imaged plate and screw fixation of the proximal left humerus. IMPRESSION: Cardiomegaly without acute abnormality of the lungs in AP portable projection. Electronically Signed   By: Eddie Candle M.D.   On: 06/02/2019 16:42   Complete decompressive laminectomy L3 partial inferior laminectomyL2.With microscopic discectomy from the left and foraminotomies of the left L3 nerve root.  Subjective: Patient was seen and examined at bedside and she is a little confused this morning and thought she is out in the lobby but was really tired very easily.  Was frustrated and wanted to know where physical therapy was.  Discussed with her about her kidney function improved and she was happy about that.  She is deemed stable from a medical perspective to discharge to skilled nursing facility after I spoke with social work that she did not need a repeat COVID testing..  She has no complaints and states her pain is  better controlled.  No nausea or vomiting.  No other concerns or complaints at this time and she is stable for discharge to a skilled nursing facility in this time and understands that she will need to follow-up with neurosurgery within 1 to 2 weeks.  Discharge Exam: Vitals:   06/04/19 1923 06/05/19 0818  BP: (!) 125/57 127/63  Pulse: 88 86  Resp: 20 20  Temp: 98.8 F (37.1 C) 99.4 F (37.4 C)  SpO2: 96% 91%   Vitals:   06/04/19 1533 06/04/19 1923 06/05/19 0300 06/05/19 0818  BP: (!) 105/56 (!) 125/57  127/63  Pulse: 82 88  86  Resp: _0 Temp: 99.1 F (37.3 C) 98.8 F (37.1 C)  99.4 F (37.4 C)  TempSrc: Oral Oral  Oral  SpO2: 91% 96%  91%  Weight:   117.8 kg   Height:       General: Pt is awake, not in acute distress; mildly confused this morning but reoriented easily Cardiovascular: RRR, S1/S2 +, no rubs, no gallops Respiratory: Diminished bilaterally, no wheezing, no rhonchi; unlabored breathing Abdominal: Soft, NT, Distended 2/2 to body habitus and has a very large pannus, bowel sounds +;  Extremities: No appreciable edema, no cyanosis  The results of significant diagnostics from this hospitalization (including imaging, microbiology, ancillary and laboratory) are listed below for reference.    Microbiology: No results found for this or any previous visit (from the past 240 hour(s)).   Labs: BNP (last 3 results) No results for input(s): BNP in the last 8760 hours. Basic Metabolic Panel: Recent Labs  Lab 06/02/19 0842 06/03/19 0549 06/03/19 1415 06/04/19 0552 06/05/19 0836  NA 133* 132* 131* 131* 133*  K 4.6 4.6 4.7 4.7 4.7  CL 97* 99 96* 100 99  CO2 _1 GLUCOSE 186* 142* 177* 150* 149*  BUN 25* 31* 30* 29* 24*  CREATININE 1.43* 1.63* 1.56* 1.50* 1.36*  CALCIUM 8.6*  8.5* 8.2* 8.3* 8.5*  MG 1.5* 1.7  --  1.8 1.7  PHOS 3.7 4.7*  --  4.4 3.7   Liver Function Tests: Recent Labs  Lab 06/02/19 0842 06/03/19 0549 06/04/19 0552  06/05/19 0836  AST _0 ALT _1 ALKPHOS 74 84 103 114  BILITOT 0.6 0.5 0.5 0.7  PROT 6.3* 6.2* 6.0* 6.2*  ALBUMIN 1.8* 1.8* 1.7* 1.7*   No results for input(s): LIPASE, AMYLASE in the last 168 hours. No results for input(s): AMMONIA in the last 168 hours. CBC: Recent Labs  Lab 06/01/19 0538 06/02/19 0842 06/03/19 0549 06/04/19 0552 06/05/19 0836  WBC 12.2* 11.7* 12.0* 12.3* 12.7*  NEUTROABS  --  7.7 6.5 7.8* 8.4*  HGB 7.8* 7.9* 7.7* 7.3* 7.4*  HCT 24.5* 24.5* 24.3* 23.4* 23.4*  MCV 93.5 92.1 92.4 93.6 90.7  PLT 304 326 309 318 364   Cardiac Enzymes: Recent Labs  Lab 06/02/19 0842 06/03/19 0549 06/04/19 0552 06/05/19 0836  CKTOTAL 51 313* 191 74   BNP: Invalid input(s): POCBNP CBG: Recent Labs  Lab 06/04/19 0815 06/04/19 1136 06/04/19 1721 06/04/19 2123 06/05/19 0815  GLUCAP 150* 153* 169* 141* 138*   D-Dimer Recent Labs    06/04/19 0552 06/05/19 0836  DDIMER 4.83* 5.18*   Hgb A1c No results for input(s): HGBA1C in the last 72 hours. Lipid Profile No results for input(s): CHOL, HDL, LDLCALC, TRIG, CHOLHDL, LDLDIRECT in the last 72 hours. Thyroid function studies No results for input(s): TSH, T4TOTAL, T3FREE, THYROIDAB in the last 72 hours.  Invalid input(s): FREET3 Anemia work up Recent Labs    06/04/19 0552 06/05/19 0836  FERRITIN 110 130   Urinalysis    Component Value Date/Time   COLORURINE COLORLESS (A) 05/24/2019 0612   APPEARANCEUR CLEAR 05/24/2019 0612   LABSPEC 1.004 (L) 05/24/2019 0612   PHURINE 7.0 05/24/2019 0612   GLUCOSEU NEGATIVE 05/24/2019 0612   GLUCOSEU 100 05/10/2012 1440   HGBUR NEGATIVE 05/24/2019 0612   BILIRUBINUR NEGATIVE 05/24/2019 0612   KETONESUR NEGATIVE 05/24/2019 0612   PROTEINUR NEGATIVE 05/24/2019 0612   UROBILINOGEN 0.2 05/10/2012 1440   NITRITE NEGATIVE 05/24/2019 0612   LEUKOCYTESUR NEGATIVE 05/24/2019 0612   Sepsis Labs Invalid input(s): PROCALCITONIN,  WBC,   LACTICIDVEN Microbiology No results found for this or any previous visit (from the past 240 hour(s)).  Time coordinating discharge: 35 minutes  SIGNED:  Kerney Elbe, DO [facility] 06/05/2019, 11:30 AM Pager is on AMION  If 7PM-7AM, please contact night-coverage [email or web address] Password [email or web address]

## 2019-06-05 NOTE — Plan of Care (Signed)
  Problem: Clinical Measurements: Goal: Will remain free from infection Outcome: Progressing Goal: Diagnostic test results will improve Outcome: Progressing Goal: Cardiovascular complication will be avoided Outcome: Progressing   Problem: Activity: Goal: Risk for activity intolerance will decrease Outcome: Progressing   Problem: Coping: Goal: Level of anxiety will decrease Outcome: Progressing   Problem: Elimination: Goal: Will not experience complications related to bowel motility Outcome: Progressing   

## 2019-06-05 NOTE — Progress Notes (Signed)
CSW received a call from pt's RN stating D/C order states "home" vs "SNF".  This Probation officer (CSW) updated the [facility] CSW facilitating D/C at ph: [phone], who is aware and will speak to provider now.  Please reconsult if future social work needs arise.  CSW signing off, as social work intervention is no longer needed.  Alice Guild. Kyann Heydt, LCSW, LCAS, CSI Transitions of Care Clinical Social Worker Care Coordination Department Ph: [phone]

## 2019-06-06 ENCOUNTER — Ambulatory Visit: Payer: PPO | Admitting: Internal Medicine

## 2019-06-06 DIAGNOSIS — I4510 Unspecified right bundle-branch block: Secondary | ICD-10-CM | POA: Diagnosis not present

## 2019-06-06 DIAGNOSIS — Z9181 History of falling: Secondary | ICD-10-CM | POA: Diagnosis not present

## 2019-06-06 DIAGNOSIS — I313 Pericardial effusion (noninflammatory): Secondary | ICD-10-CM | POA: Diagnosis not present

## 2019-06-06 DIAGNOSIS — N178 Other acute kidney failure: Secondary | ICD-10-CM | POA: Diagnosis not present

## 2019-06-06 DIAGNOSIS — I951 Orthostatic hypotension: Secondary | ICD-10-CM | POA: Diagnosis not present

## 2019-06-06 DIAGNOSIS — R6520 Severe sepsis without septic shock: Secondary | ICD-10-CM | POA: Diagnosis not present

## 2019-06-06 DIAGNOSIS — E785 Hyperlipidemia, unspecified: Secondary | ICD-10-CM | POA: Diagnosis not present

## 2019-06-06 DIAGNOSIS — J690 Pneumonitis due to inhalation of food and vomit: Secondary | ICD-10-CM | POA: Diagnosis not present

## 2019-06-06 DIAGNOSIS — W19XXXA Unspecified fall, initial encounter: Secondary | ICD-10-CM | POA: Diagnosis present

## 2019-06-06 DIAGNOSIS — Z79899 Other long term (current) drug therapy: Secondary | ICD-10-CM | POA: Diagnosis not present

## 2019-06-06 DIAGNOSIS — R404 Transient alteration of awareness: Secondary | ICD-10-CM | POA: Diagnosis not present

## 2019-06-06 DIAGNOSIS — F39 Unspecified mood [affective] disorder: Secondary | ICD-10-CM | POA: Diagnosis not present

## 2019-06-06 DIAGNOSIS — E039 Hypothyroidism, unspecified: Secondary | ICD-10-CM | POA: Diagnosis not present

## 2019-06-06 DIAGNOSIS — R279 Unspecified lack of coordination: Secondary | ICD-10-CM | POA: Diagnosis not present

## 2019-06-06 DIAGNOSIS — S30811A Abrasion of abdominal wall, initial encounter: Secondary | ICD-10-CM | POA: Diagnosis not present

## 2019-06-06 DIAGNOSIS — R195 Other fecal abnormalities: Secondary | ICD-10-CM | POA: Diagnosis not present

## 2019-06-06 DIAGNOSIS — L89899 Pressure ulcer of other site, unspecified stage: Secondary | ICD-10-CM | POA: Diagnosis not present

## 2019-06-06 DIAGNOSIS — R262 Difficulty in walking, not elsewhere classified: Secondary | ICD-10-CM | POA: Diagnosis not present

## 2019-06-06 DIAGNOSIS — G9341 Metabolic encephalopathy: Secondary | ICD-10-CM | POA: Diagnosis not present

## 2019-06-06 DIAGNOSIS — R2689 Other abnormalities of gait and mobility: Secondary | ICD-10-CM | POA: Diagnosis not present

## 2019-06-06 DIAGNOSIS — D72828 Other elevated white blood cell count: Secondary | ICD-10-CM | POA: Diagnosis not present

## 2019-06-06 DIAGNOSIS — Z885 Allergy status to narcotic agent status: Secondary | ICD-10-CM | POA: Diagnosis not present

## 2019-06-06 DIAGNOSIS — R42 Dizziness and giddiness: Secondary | ICD-10-CM | POA: Diagnosis not present

## 2019-06-06 DIAGNOSIS — R41841 Cognitive communication deficit: Secondary | ICD-10-CM | POA: Diagnosis not present

## 2019-06-06 DIAGNOSIS — E43 Unspecified severe protein-calorie malnutrition: Secondary | ICD-10-CM | POA: Diagnosis not present

## 2019-06-06 DIAGNOSIS — R0902 Hypoxemia: Secondary | ICD-10-CM | POA: Diagnosis not present

## 2019-06-06 DIAGNOSIS — N1831 Chronic kidney disease, stage 3a: Secondary | ICD-10-CM | POA: Diagnosis not present

## 2019-06-06 DIAGNOSIS — I5032 Chronic diastolic (congestive) heart failure: Secondary | ICD-10-CM | POA: Diagnosis not present

## 2019-06-06 DIAGNOSIS — N183 Chronic kidney disease, stage 3 unspecified: Secondary | ICD-10-CM | POA: Diagnosis not present

## 2019-06-06 DIAGNOSIS — M48061 Spinal stenosis, lumbar region without neurogenic claudication: Secondary | ICD-10-CM | POA: Diagnosis not present

## 2019-06-06 DIAGNOSIS — R601 Generalized edema: Secondary | ICD-10-CM | POA: Diagnosis not present

## 2019-06-06 DIAGNOSIS — R110 Nausea: Secondary | ICD-10-CM | POA: Diagnosis not present

## 2019-06-06 DIAGNOSIS — J9621 Acute and chronic respiratory failure with hypoxia: Secondary | ICD-10-CM | POA: Diagnosis not present

## 2019-06-06 DIAGNOSIS — I5033 Acute on chronic diastolic (congestive) heart failure: Secondary | ICD-10-CM | POA: Diagnosis not present

## 2019-06-06 DIAGNOSIS — K810 Acute cholecystitis: Secondary | ICD-10-CM | POA: Diagnosis not present

## 2019-06-06 DIAGNOSIS — G9349 Other encephalopathy: Secondary | ICD-10-CM | POA: Diagnosis not present

## 2019-06-06 DIAGNOSIS — N179 Acute kidney failure, unspecified: Secondary | ICD-10-CM | POA: Diagnosis not present

## 2019-06-06 DIAGNOSIS — R5381 Other malaise: Secondary | ICD-10-CM | POA: Diagnosis not present

## 2019-06-06 DIAGNOSIS — Z66 Do not resuscitate: Secondary | ICD-10-CM | POA: Diagnosis not present

## 2019-06-06 DIAGNOSIS — Z7401 Bed confinement status: Secondary | ICD-10-CM | POA: Diagnosis not present

## 2019-06-06 DIAGNOSIS — Z7189 Other specified counseling: Secondary | ICD-10-CM | POA: Diagnosis not present

## 2019-06-06 DIAGNOSIS — M2550 Pain in unspecified joint: Secondary | ICD-10-CM | POA: Diagnosis not present

## 2019-06-06 DIAGNOSIS — Z8619 Personal history of other infectious and parasitic diseases: Secondary | ICD-10-CM | POA: Diagnosis not present

## 2019-06-06 DIAGNOSIS — I2729 Other secondary pulmonary hypertension: Secondary | ICD-10-CM | POA: Diagnosis not present

## 2019-06-06 DIAGNOSIS — F329 Major depressive disorder, single episode, unspecified: Secondary | ICD-10-CM | POA: Diagnosis not present

## 2019-06-06 DIAGNOSIS — Z881 Allergy status to other antibiotic agents status: Secondary | ICD-10-CM | POA: Diagnosis not present

## 2019-06-06 DIAGNOSIS — E1165 Type 2 diabetes mellitus with hyperglycemia: Secondary | ICD-10-CM | POA: Diagnosis not present

## 2019-06-06 DIAGNOSIS — D72829 Elevated white blood cell count, unspecified: Secondary | ICD-10-CM | POA: Diagnosis not present

## 2019-06-06 DIAGNOSIS — E118 Type 2 diabetes mellitus with unspecified complications: Secondary | ICD-10-CM | POA: Diagnosis not present

## 2019-06-06 DIAGNOSIS — L03311 Cellulitis of abdominal wall: Secondary | ICD-10-CM | POA: Diagnosis not present

## 2019-06-06 DIAGNOSIS — R52 Pain, unspecified: Secondary | ICD-10-CM | POA: Diagnosis not present

## 2019-06-06 DIAGNOSIS — I959 Hypotension, unspecified: Secondary | ICD-10-CM | POA: Diagnosis not present

## 2019-06-06 DIAGNOSIS — G8929 Other chronic pain: Secondary | ICD-10-CM | POA: Diagnosis not present

## 2019-06-06 DIAGNOSIS — D473 Essential (hemorrhagic) thrombocythemia: Secondary | ICD-10-CM | POA: Diagnosis not present

## 2019-06-06 DIAGNOSIS — E860 Dehydration: Secondary | ICD-10-CM | POA: Diagnosis not present

## 2019-06-06 DIAGNOSIS — E038 Other specified hypothyroidism: Secondary | ICD-10-CM | POA: Diagnosis not present

## 2019-06-06 DIAGNOSIS — M961 Postlaminectomy syndrome, not elsewhere classified: Secondary | ICD-10-CM | POA: Diagnosis not present

## 2019-06-06 DIAGNOSIS — N390 Urinary tract infection, site not specified: Secondary | ICD-10-CM | POA: Diagnosis not present

## 2019-06-06 DIAGNOSIS — D638 Anemia in other chronic diseases classified elsewhere: Secondary | ICD-10-CM | POA: Diagnosis not present

## 2019-06-06 DIAGNOSIS — M5489 Other dorsalgia: Secondary | ICD-10-CM | POA: Diagnosis not present

## 2019-06-06 DIAGNOSIS — Z9049 Acquired absence of other specified parts of digestive tract: Secondary | ICD-10-CM | POA: Diagnosis not present

## 2019-06-06 DIAGNOSIS — X58XXXA Exposure to other specified factors, initial encounter: Secondary | ICD-10-CM | POA: Diagnosis not present

## 2019-06-06 DIAGNOSIS — R2681 Unsteadiness on feet: Secondary | ICD-10-CM | POA: Diagnosis not present

## 2019-06-06 DIAGNOSIS — E1142 Type 2 diabetes mellitus with diabetic polyneuropathy: Secondary | ICD-10-CM | POA: Diagnosis not present

## 2019-06-06 DIAGNOSIS — R188 Other ascites: Secondary | ICD-10-CM | POA: Diagnosis not present

## 2019-06-06 DIAGNOSIS — R4189 Other symptoms and signs involving cognitive functions and awareness: Secondary | ICD-10-CM | POA: Diagnosis not present

## 2019-06-06 DIAGNOSIS — A419 Sepsis, unspecified organism: Secondary | ICD-10-CM | POA: Diagnosis not present

## 2019-06-06 DIAGNOSIS — A0472 Enterocolitis due to Clostridium difficile, not specified as recurrent: Secondary | ICD-10-CM | POA: Diagnosis not present

## 2019-06-06 DIAGNOSIS — E1122 Type 2 diabetes mellitus with diabetic chronic kidney disease: Secondary | ICD-10-CM | POA: Diagnosis not present

## 2019-06-06 DIAGNOSIS — Z743 Need for continuous supervision: Secondary | ICD-10-CM | POA: Diagnosis not present

## 2019-06-06 DIAGNOSIS — R4182 Altered mental status, unspecified: Secondary | ICD-10-CM | POA: Diagnosis not present

## 2019-06-06 DIAGNOSIS — Z6841 Body Mass Index (BMI) 40.0 and over, adult: Secondary | ICD-10-CM | POA: Diagnosis not present

## 2019-06-06 DIAGNOSIS — I130 Hypertensive heart and chronic kidney disease with heart failure and stage 1 through stage 4 chronic kidney disease, or unspecified chronic kidney disease: Secondary | ICD-10-CM | POA: Diagnosis not present

## 2019-06-06 DIAGNOSIS — K8010 Calculus of gallbladder with chronic cholecystitis without obstruction: Secondary | ICD-10-CM | POA: Diagnosis not present

## 2019-06-06 DIAGNOSIS — E669 Obesity, unspecified: Secondary | ICD-10-CM | POA: Diagnosis not present

## 2019-06-06 DIAGNOSIS — R410 Disorientation, unspecified: Secondary | ICD-10-CM | POA: Diagnosis not present

## 2019-06-06 DIAGNOSIS — U071 COVID-19: Secondary | ICD-10-CM | POA: Diagnosis not present

## 2019-06-06 DIAGNOSIS — R198 Other specified symptoms and signs involving the digestive system and abdomen: Secondary | ICD-10-CM | POA: Diagnosis not present

## 2019-06-06 DIAGNOSIS — M6281 Muscle weakness (generalized): Secondary | ICD-10-CM | POA: Diagnosis not present

## 2019-06-06 DIAGNOSIS — E8809 Other disorders of plasma-protein metabolism, not elsewhere classified: Secondary | ICD-10-CM | POA: Diagnosis not present

## 2019-06-06 DIAGNOSIS — K819 Cholecystitis, unspecified: Secondary | ICD-10-CM | POA: Diagnosis not present

## 2019-06-06 DIAGNOSIS — I10 Essential (primary) hypertension: Secondary | ICD-10-CM | POA: Diagnosis not present

## 2019-06-06 DIAGNOSIS — I9589 Other hypotension: Secondary | ICD-10-CM | POA: Diagnosis not present

## 2019-06-06 DIAGNOSIS — Z978 Presence of other specified devices: Secondary | ICD-10-CM | POA: Diagnosis not present

## 2019-06-06 DIAGNOSIS — F3289 Other specified depressive episodes: Secondary | ICD-10-CM | POA: Diagnosis not present

## 2019-06-06 DIAGNOSIS — Z515 Encounter for palliative care: Secondary | ICD-10-CM | POA: Diagnosis not present

## 2019-06-06 DIAGNOSIS — F22 Delusional disorders: Secondary | ICD-10-CM | POA: Diagnosis not present

## 2019-06-06 DIAGNOSIS — E871 Hypo-osmolality and hyponatremia: Secondary | ICD-10-CM | POA: Diagnosis not present

## 2019-06-06 LAB — COMPREHENSIVE METABOLIC PANEL
ALT: 23 U/L (ref 0–44)
AST: 39 U/L (ref 15–41)
Albumin: 1.7 g/dL — ABNORMAL LOW (ref 3.5–5.0)
Alkaline Phosphatase: 162 U/L — ABNORMAL HIGH (ref 38–126)
Anion gap: 13 (ref 5–15)
BUN: 22 mg/dL (ref 8–23)
CO2: 22 mmol/L (ref 22–32)
Calcium: 8.5 mg/dL — ABNORMAL LOW (ref 8.9–10.3)
Chloride: 98 mmol/L (ref 98–111)
Creatinine, Ser: 1.33 mg/dL — ABNORMAL HIGH (ref 0.44–1.00)
GFR calc Af Amer: 47 mL/min — ABNORMAL LOW (ref >60)
GFR calc non Af Amer: 41 mL/min — ABNORMAL LOW (ref >60)
Glucose, Bld: 148 mg/dL — ABNORMAL HIGH (ref 70–99)
Potassium: 4.4 mmol/L (ref 3.5–5.1)
Sodium: 133 mmol/L — ABNORMAL LOW (ref 135–145)
Total Bilirubin: 0.6 mg/dL (ref 0.3–1.2)
Total Protein: 6.4 g/dL — ABNORMAL LOW (ref 6.5–8.1)

## 2019-06-06 LAB — CBC WITH DIFFERENTIAL/PLATELET
Abs Immature Granulocytes: 0.09 10*3/uL — ABNORMAL HIGH (ref 0.00–0.07)
Basophils Absolute: 0 10*3/uL (ref 0.0–0.1)
Basophils Relative: 0 %
Eosinophils Absolute: 0.3 10*3/uL (ref 0.0–0.5)
Eosinophils Relative: 2 %
HCT: 24.1 % — ABNORMAL LOW (ref 36.0–46.0)
Hemoglobin: 7.7 g/dL — ABNORMAL LOW (ref 12.0–15.0)
Immature Granulocytes: 1 %
Lymphocytes Relative: 19 %
Lymphs Abs: 2.4 10*3/uL (ref 0.7–4.0)
MCH: 28.7 pg (ref 26.0–34.0)
MCHC: 32 g/dL (ref 30.0–36.0)
MCV: 89.9 fL (ref 80.0–100.0)
Monocytes Absolute: 0.9 10*3/uL (ref 0.1–1.0)
Monocytes Relative: 7 %
Neutro Abs: 9.2 10*3/uL — ABNORMAL HIGH (ref 1.7–7.7)
Neutrophils Relative %: 71 %
Platelets: 415 10*3/uL — ABNORMAL HIGH (ref 150–400)
RBC: 2.68 MIL/uL — ABNORMAL LOW (ref 3.87–5.11)
RDW: 14.6 % (ref 11.5–15.5)
WBC: 12.9 10*3/uL — ABNORMAL HIGH (ref 4.0–10.5)
nRBC: 0.2 % (ref 0.0–0.2)

## 2019-06-06 LAB — GLUCOSE, CAPILLARY
Glucose-Capillary: 168 mg/dL — ABNORMAL HIGH (ref 70–99)
Glucose-Capillary: 119 mg/dL — ABNORMAL HIGH (ref 70–99)

## 2019-06-06 LAB — D-DIMER, QUANTITATIVE: D-Dimer, Quant: 4.2 ug/mL-FEU — ABNORMAL HIGH (ref 0.00–0.50)

## 2019-06-06 LAB — CK: Total CK: 275 U/L — ABNORMAL HIGH (ref 38–234)

## 2019-06-06 LAB — MAGNESIUM: Magnesium: 1.7 mg/dL (ref 1.7–2.4)

## 2019-06-06 LAB — FERRITIN: Ferritin: 141 ng/mL (ref 11–307)

## 2019-06-06 LAB — LACTATE DEHYDROGENASE: LDH: 120 U/L (ref 98–192)

## 2019-06-06 LAB — PHOSPHORUS: Phosphorus: 4 mg/dL (ref 2.5–4.6)

## 2019-06-06 LAB — SEDIMENTATION RATE: Sed Rate: >140 mm/hr — ABNORMAL HIGH (ref 0–22)

## 2019-06-06 LAB — FIBRINOGEN: Fibrinogen: >800 mg/dL — ABNORMAL HIGH (ref 210–475)

## 2019-06-06 MED FILL — ONDANSETRON HCL 4 MG TABLET: 4 | 7 days supply | Qty: 20 | Fill #0

## 2019-06-06 MED FILL — METHOCARBAMOL 500 MG TABS: 500 | 10 days supply | Qty: 30 | Fill #0

## 2019-06-06 MED FILL — PANTOPRAZOLE SOD DR 40 MG T: 40 | 30 days supply | Qty: 30 | Fill #0

## 2019-06-06 NOTE — TOC Transition Note (Signed)
Transition of Care [facility]) - CM/SW Discharge Note   Patient Details  Name: Alice Kemp MRN: [id number] Date of Birth: 1951-07-08  Transition of Care [facility]) CM/SW Contact:  Geralynn Ochs, LCSW Phone Number: 06/06/2019, 11:43 AM   Clinical Narrative:   Nurse to call report to [phone], Room 606P.  Transport set for 1:00 PM.    Final next level of care: [place] Barriers to Discharge: Barriers Resolved   Patient Goals and CMS Choice        Discharge Placement              Patient chooses bed at: [facility] Patient to be transferred to facility by: [place] Name of family member notified: Self Patient and family notified of of transfer: 06/06/19  Discharge Plan and Services                                     Social Determinants of Health (SDOH) Interventions     Readmission Risk Interventions No flowsheet data found.

## 2019-06-06 NOTE — Progress Notes (Signed)
Attempted to give report to [facility] 3x.

## 2019-06-07 ENCOUNTER — Telehealth: Payer: Self-pay | Admitting: *Deleted

## 2019-06-07 DIAGNOSIS — I5032 Chronic diastolic (congestive) heart failure: Secondary | ICD-10-CM | POA: Diagnosis not present

## 2019-06-07 DIAGNOSIS — E118 Type 2 diabetes mellitus with unspecified complications: Secondary | ICD-10-CM | POA: Diagnosis not present

## 2019-06-07 DIAGNOSIS — I2729 Other secondary pulmonary hypertension: Secondary | ICD-10-CM | POA: Diagnosis not present

## 2019-06-07 DIAGNOSIS — D638 Anemia in other chronic diseases classified elsewhere: Secondary | ICD-10-CM | POA: Diagnosis not present

## 2019-06-07 DIAGNOSIS — I10 Essential (primary) hypertension: Secondary | ICD-10-CM | POA: Diagnosis not present

## 2019-06-07 DIAGNOSIS — U071 COVID-19: Secondary | ICD-10-CM | POA: Diagnosis not present

## 2019-06-07 DIAGNOSIS — N178 Other acute kidney failure: Secondary | ICD-10-CM | POA: Diagnosis not present

## 2019-06-07 DIAGNOSIS — M48061 Spinal stenosis, lumbar region without neurogenic claudication: Secondary | ICD-10-CM | POA: Diagnosis not present

## 2019-06-07 DIAGNOSIS — L03311 Cellulitis of abdominal wall: Secondary | ICD-10-CM | POA: Diagnosis not present

## 2019-06-07 DIAGNOSIS — E038 Other specified hypothyroidism: Secondary | ICD-10-CM | POA: Diagnosis not present

## 2019-06-07 NOTE — Telephone Encounter (Signed)
Pt was on TCM report she was admitted 05/26/19 for low back pain and also found to have abdominal cellulitis. 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 PT OT evaluated and recommending skilled nursing facility. Pt was D/C and sent to SNF. Per summary will need to f/u w/PCP 1-2 weeks after discharge from facility.Marland KitchenJohny Chess

## 2019-06-10 DIAGNOSIS — I5032 Chronic diastolic (congestive) heart failure: Secondary | ICD-10-CM | POA: Diagnosis not present

## 2019-06-10 DIAGNOSIS — F39 Unspecified mood [affective] disorder: Secondary | ICD-10-CM | POA: Diagnosis not present

## 2019-06-10 DIAGNOSIS — F22 Delusional disorders: Secondary | ICD-10-CM | POA: Diagnosis not present

## 2019-06-10 DIAGNOSIS — E118 Type 2 diabetes mellitus with unspecified complications: Secondary | ICD-10-CM | POA: Diagnosis not present

## 2019-06-10 DIAGNOSIS — L03311 Cellulitis of abdominal wall: Secondary | ICD-10-CM | POA: Diagnosis not present

## 2019-06-14 DIAGNOSIS — R0902 Hypoxemia: Secondary | ICD-10-CM | POA: Diagnosis not present

## 2019-06-14 DIAGNOSIS — I9589 Other hypotension: Secondary | ICD-10-CM | POA: Diagnosis not present

## 2019-06-14 DIAGNOSIS — I5032 Chronic diastolic (congestive) heart failure: Secondary | ICD-10-CM | POA: Diagnosis not present

## 2019-06-14 DIAGNOSIS — U071 COVID-19: Secondary | ICD-10-CM | POA: Diagnosis not present

## 2019-06-14 DIAGNOSIS — Z7189 Other specified counseling: Secondary | ICD-10-CM | POA: Diagnosis not present

## 2019-06-15 ENCOUNTER — Telehealth: Payer: Self-pay

## 2019-06-15 DIAGNOSIS — N178 Other acute kidney failure: Secondary | ICD-10-CM | POA: Diagnosis not present

## 2019-06-15 DIAGNOSIS — I9589 Other hypotension: Secondary | ICD-10-CM | POA: Diagnosis not present

## 2019-06-15 DIAGNOSIS — E871 Hypo-osmolality and hyponatremia: Secondary | ICD-10-CM | POA: Diagnosis not present

## 2019-06-15 DIAGNOSIS — F22 Delusional disorders: Secondary | ICD-10-CM | POA: Diagnosis not present

## 2019-06-15 DIAGNOSIS — D473 Essential (hemorrhagic) thrombocythemia: Secondary | ICD-10-CM | POA: Diagnosis not present

## 2019-06-15 NOTE — Telephone Encounter (Signed)
Copied from [place] [phone]. Topic: General - Inquiry >> Jun 14, 2019  3:02 PM Richardo Priest, [place] wrote: Reason for CRM: Patient called in and is wanting a call back from [place] to discuss a few things. Please advise.

## 2019-06-15 NOTE — Telephone Encounter (Signed)
Pt wanted to let us know she was in the hospital and now in rehab which is why she missed her appointment on 8/31. I expressed understanding.

## 2019-06-17 DIAGNOSIS — I951 Orthostatic hypotension: Secondary | ICD-10-CM | POA: Diagnosis not present

## 2019-06-17 DIAGNOSIS — I10 Essential (primary) hypertension: Secondary | ICD-10-CM | POA: Diagnosis not present

## 2019-06-17 DIAGNOSIS — I5032 Chronic diastolic (congestive) heart failure: Secondary | ICD-10-CM | POA: Diagnosis not present

## 2019-06-20 DIAGNOSIS — D638 Anemia in other chronic diseases classified elsewhere: Secondary | ICD-10-CM | POA: Diagnosis not present

## 2019-06-20 DIAGNOSIS — F39 Unspecified mood [affective] disorder: Secondary | ICD-10-CM | POA: Diagnosis not present

## 2019-06-20 DIAGNOSIS — R110 Nausea: Secondary | ICD-10-CM | POA: Diagnosis not present

## 2019-06-20 DIAGNOSIS — I951 Orthostatic hypotension: Secondary | ICD-10-CM | POA: Diagnosis not present

## 2019-06-21 DIAGNOSIS — D473 Essential (hemorrhagic) thrombocythemia: Secondary | ICD-10-CM | POA: Diagnosis not present

## 2019-06-21 DIAGNOSIS — N178 Other acute kidney failure: Secondary | ICD-10-CM | POA: Diagnosis not present

## 2019-06-21 DIAGNOSIS — E118 Type 2 diabetes mellitus with unspecified complications: Secondary | ICD-10-CM | POA: Diagnosis not present

## 2019-06-21 DIAGNOSIS — I5032 Chronic diastolic (congestive) heart failure: Secondary | ICD-10-CM | POA: Diagnosis not present

## 2019-06-21 DIAGNOSIS — E871 Hypo-osmolality and hyponatremia: Secondary | ICD-10-CM | POA: Diagnosis not present

## 2019-06-21 DIAGNOSIS — I951 Orthostatic hypotension: Secondary | ICD-10-CM | POA: Diagnosis not present

## 2019-06-22 DIAGNOSIS — I951 Orthostatic hypotension: Secondary | ICD-10-CM | POA: Diagnosis not present

## 2019-06-22 DIAGNOSIS — R110 Nausea: Secondary | ICD-10-CM | POA: Diagnosis not present

## 2019-06-22 DIAGNOSIS — R195 Other fecal abnormalities: Secondary | ICD-10-CM | POA: Diagnosis not present

## 2019-06-22 DIAGNOSIS — M961 Postlaminectomy syndrome, not elsewhere classified: Secondary | ICD-10-CM | POA: Diagnosis not present

## 2019-06-23 DIAGNOSIS — R195 Other fecal abnormalities: Secondary | ICD-10-CM | POA: Diagnosis not present

## 2019-06-23 DIAGNOSIS — D473 Essential (hemorrhagic) thrombocythemia: Secondary | ICD-10-CM | POA: Diagnosis not present

## 2019-06-23 DIAGNOSIS — M961 Postlaminectomy syndrome, not elsewhere classified: Secondary | ICD-10-CM | POA: Diagnosis not present

## 2019-06-23 DIAGNOSIS — I5032 Chronic diastolic (congestive) heart failure: Secondary | ICD-10-CM | POA: Diagnosis not present

## 2019-06-23 DIAGNOSIS — R42 Dizziness and giddiness: Secondary | ICD-10-CM | POA: Diagnosis not present

## 2019-06-23 DIAGNOSIS — R4189 Other symptoms and signs involving cognitive functions and awareness: Secondary | ICD-10-CM | POA: Diagnosis not present

## 2019-06-28 DIAGNOSIS — D638 Anemia in other chronic diseases classified elsewhere: Secondary | ICD-10-CM | POA: Diagnosis not present

## 2019-06-28 DIAGNOSIS — I5032 Chronic diastolic (congestive) heart failure: Secondary | ICD-10-CM | POA: Diagnosis not present

## 2019-06-28 DIAGNOSIS — R198 Other specified symptoms and signs involving the digestive system and abdomen: Secondary | ICD-10-CM | POA: Diagnosis not present

## 2019-06-28 DIAGNOSIS — N178 Other acute kidney failure: Secondary | ICD-10-CM | POA: Diagnosis not present

## 2019-06-28 DIAGNOSIS — D72828 Other elevated white blood cell count: Secondary | ICD-10-CM | POA: Diagnosis not present

## 2019-06-28 DIAGNOSIS — R0902 Hypoxemia: Secondary | ICD-10-CM | POA: Diagnosis not present

## 2019-06-28 DIAGNOSIS — I10 Essential (primary) hypertension: Secondary | ICD-10-CM | POA: Diagnosis not present

## 2019-07-01 DIAGNOSIS — I10 Essential (primary) hypertension: Secondary | ICD-10-CM | POA: Diagnosis not present

## 2019-07-01 DIAGNOSIS — R195 Other fecal abnormalities: Secondary | ICD-10-CM | POA: Diagnosis not present

## 2019-07-01 DIAGNOSIS — I5032 Chronic diastolic (congestive) heart failure: Secondary | ICD-10-CM | POA: Diagnosis not present

## 2019-07-01 DIAGNOSIS — I951 Orthostatic hypotension: Secondary | ICD-10-CM | POA: Diagnosis not present

## 2019-07-04 ENCOUNTER — Emergency Department (HOSPITAL_COMMUNITY): Payer: PPO

## 2019-07-04 ENCOUNTER — Encounter (HOSPITAL_COMMUNITY): Payer: Self-pay | Admitting: Emergency Medicine

## 2019-07-04 ENCOUNTER — Inpatient Hospital Stay (HOSPITAL_COMMUNITY)
Admission: EM | Admit: 2019-07-04 | Discharge: 2019-08-05 | DRG: 371 | Disposition: A | Discharge status: Hospice | Payer: PPO | Source: Skilled Nursing Facility | Attending: Internal Medicine | Admitting: Internal Medicine

## 2019-07-04 ENCOUNTER — Other Ambulatory Visit: Payer: Self-pay

## 2019-07-04 DIAGNOSIS — Z66 Do not resuscitate: Secondary | ICD-10-CM | POA: Diagnosis present

## 2019-07-04 DIAGNOSIS — E1142 Type 2 diabetes mellitus with diabetic polyneuropathy: Secondary | ICD-10-CM | POA: Diagnosis not present

## 2019-07-04 DIAGNOSIS — Z452 Encounter for adjustment and management of vascular access device: Secondary | ICD-10-CM

## 2019-07-04 DIAGNOSIS — R197 Diarrhea, unspecified: Secondary | ICD-10-CM | POA: Diagnosis not present

## 2019-07-04 DIAGNOSIS — R221 Localized swelling, mass and lump, neck: Secondary | ICD-10-CM | POA: Diagnosis not present

## 2019-07-04 DIAGNOSIS — G253 Myoclonus: Secondary | ICD-10-CM

## 2019-07-04 DIAGNOSIS — R188 Other ascites: Secondary | ICD-10-CM | POA: Diagnosis not present

## 2019-07-04 DIAGNOSIS — Z885 Allergy status to narcotic agent status: Secondary | ICD-10-CM | POA: Diagnosis not present

## 2019-07-04 DIAGNOSIS — K819 Cholecystitis, unspecified: Secondary | ICD-10-CM | POA: Diagnosis not present

## 2019-07-04 DIAGNOSIS — G8929 Other chronic pain: Secondary | ICD-10-CM | POA: Diagnosis present

## 2019-07-04 DIAGNOSIS — U071 COVID-19: Secondary | ICD-10-CM | POA: Diagnosis present

## 2019-07-04 DIAGNOSIS — Z818 Family history of other mental and behavioral disorders: Secondary | ICD-10-CM

## 2019-07-04 DIAGNOSIS — R4020 Unspecified coma: Secondary | ICD-10-CM | POA: Diagnosis not present

## 2019-07-04 DIAGNOSIS — E860 Dehydration: Secondary | ICD-10-CM | POA: Diagnosis not present

## 2019-07-04 DIAGNOSIS — K8020 Calculus of gallbladder without cholecystitis without obstruction: Secondary | ICD-10-CM | POA: Diagnosis not present

## 2019-07-04 DIAGNOSIS — Z8371 Family history of colonic polyps: Secondary | ICD-10-CM

## 2019-07-04 DIAGNOSIS — E785 Hyperlipidemia, unspecified: Secondary | ICD-10-CM | POA: Diagnosis present

## 2019-07-04 DIAGNOSIS — N179 Acute kidney failure, unspecified: Secondary | ICD-10-CM | POA: Diagnosis present

## 2019-07-04 DIAGNOSIS — R1310 Dysphagia, unspecified: Secondary | ICD-10-CM | POA: Diagnosis not present

## 2019-07-04 DIAGNOSIS — R52 Pain, unspecified: Secondary | ICD-10-CM | POA: Diagnosis not present

## 2019-07-04 DIAGNOSIS — E1122 Type 2 diabetes mellitus with diabetic chronic kidney disease: Secondary | ICD-10-CM | POA: Diagnosis not present

## 2019-07-04 DIAGNOSIS — I5032 Chronic diastolic (congestive) heart failure: Secondary | ICD-10-CM | POA: Diagnosis not present

## 2019-07-04 DIAGNOSIS — L89152 Pressure ulcer of sacral region, stage 2: Secondary | ICD-10-CM | POA: Clinically undetermined

## 2019-07-04 DIAGNOSIS — X58XXXA Exposure to other specified factors, initial encounter: Secondary | ICD-10-CM | POA: Diagnosis not present

## 2019-07-04 DIAGNOSIS — J9621 Acute and chronic respiratory failure with hypoxia: Secondary | ICD-10-CM | POA: Diagnosis not present

## 2019-07-04 DIAGNOSIS — F3289 Other specified depressive episodes: Secondary | ICD-10-CM | POA: Diagnosis not present

## 2019-07-04 DIAGNOSIS — M109 Gout, unspecified: Secondary | ICD-10-CM | POA: Diagnosis present

## 2019-07-04 DIAGNOSIS — N183 Chronic kidney disease, stage 3 unspecified: Secondary | ICD-10-CM | POA: Diagnosis not present

## 2019-07-04 DIAGNOSIS — D72829 Elevated white blood cell count, unspecified: Secondary | ICD-10-CM

## 2019-07-04 DIAGNOSIS — E8809 Other disorders of plasma-protein metabolism, not elsewhere classified: Secondary | ICD-10-CM

## 2019-07-04 DIAGNOSIS — Z86010 Personal history of colonic polyps: Secondary | ICD-10-CM

## 2019-07-04 DIAGNOSIS — Z7189 Other specified counseling: Secondary | ICD-10-CM | POA: Diagnosis not present

## 2019-07-04 DIAGNOSIS — D638 Anemia in other chronic diseases classified elsewhere: Secondary | ICD-10-CM | POA: Diagnosis present

## 2019-07-04 DIAGNOSIS — M2550 Pain in unspecified joint: Secondary | ICD-10-CM | POA: Diagnosis not present

## 2019-07-04 DIAGNOSIS — H409 Unspecified glaucoma: Secondary | ICD-10-CM | POA: Diagnosis present

## 2019-07-04 DIAGNOSIS — Z7401 Bed confinement status: Secondary | ICD-10-CM | POA: Diagnosis not present

## 2019-07-04 DIAGNOSIS — Z808 Family history of malignant neoplasm of other organs or systems: Secondary | ICD-10-CM

## 2019-07-04 DIAGNOSIS — L98499 Non-pressure chronic ulcer of skin of other sites with unspecified severity: Secondary | ICD-10-CM | POA: Diagnosis not present

## 2019-07-04 DIAGNOSIS — Z882 Allergy status to sulfonamides status: Secondary | ICD-10-CM

## 2019-07-04 DIAGNOSIS — W19XXXA Unspecified fall, initial encounter: Secondary | ICD-10-CM | POA: Diagnosis present

## 2019-07-04 DIAGNOSIS — B379 Candidiasis, unspecified: Secondary | ICD-10-CM | POA: Diagnosis not present

## 2019-07-04 DIAGNOSIS — K8010 Calculus of gallbladder with chronic cholecystitis without obstruction: Secondary | ICD-10-CM | POA: Diagnosis not present

## 2019-07-04 DIAGNOSIS — F329 Major depressive disorder, single episode, unspecified: Secondary | ICD-10-CM | POA: Diagnosis present

## 2019-07-04 DIAGNOSIS — R54 Age-related physical debility: Secondary | ICD-10-CM | POA: Diagnosis present

## 2019-07-04 DIAGNOSIS — K828 Other specified diseases of gallbladder: Secondary | ICD-10-CM | POA: Diagnosis not present

## 2019-07-04 DIAGNOSIS — I959 Hypotension, unspecified: Secondary | ICD-10-CM | POA: Diagnosis not present

## 2019-07-04 DIAGNOSIS — G9341 Metabolic encephalopathy: Secondary | ICD-10-CM | POA: Diagnosis not present

## 2019-07-04 DIAGNOSIS — E538 Deficiency of other specified B group vitamins: Secondary | ICD-10-CM | POA: Diagnosis present

## 2019-07-04 DIAGNOSIS — R109 Unspecified abdominal pain: Secondary | ICD-10-CM | POA: Diagnosis not present

## 2019-07-04 DIAGNOSIS — M1990 Unspecified osteoarthritis, unspecified site: Secondary | ICD-10-CM | POA: Diagnosis present

## 2019-07-04 DIAGNOSIS — I272 Pulmonary hypertension, unspecified: Secondary | ICD-10-CM | POA: Diagnosis present

## 2019-07-04 DIAGNOSIS — J690 Pneumonitis due to inhalation of food and vomit: Secondary | ICD-10-CM | POA: Diagnosis not present

## 2019-07-04 DIAGNOSIS — G9349 Other encephalopathy: Secondary | ICD-10-CM | POA: Diagnosis not present

## 2019-07-04 DIAGNOSIS — Z6841 Body Mass Index (BMI) 40.0 and over, adult: Secondary | ICD-10-CM

## 2019-07-04 DIAGNOSIS — R410 Disorientation, unspecified: Secondary | ICD-10-CM | POA: Diagnosis not present

## 2019-07-04 DIAGNOSIS — I313 Pericardial effusion (noninflammatory): Secondary | ICD-10-CM | POA: Diagnosis not present

## 2019-07-04 DIAGNOSIS — R21 Rash and other nonspecific skin eruption: Secondary | ICD-10-CM

## 2019-07-04 DIAGNOSIS — E039 Hypothyroidism, unspecified: Secondary | ICD-10-CM | POA: Diagnosis not present

## 2019-07-04 DIAGNOSIS — R404 Transient alteration of awareness: Secondary | ICD-10-CM | POA: Diagnosis not present

## 2019-07-04 DIAGNOSIS — E876 Hypokalemia: Secondary | ICD-10-CM | POA: Diagnosis not present

## 2019-07-04 DIAGNOSIS — Z79890 Hormone replacement therapy (postmenopausal): Secondary | ICD-10-CM

## 2019-07-04 DIAGNOSIS — E669 Obesity, unspecified: Secondary | ICD-10-CM | POA: Diagnosis not present

## 2019-07-04 DIAGNOSIS — I5033 Acute on chronic diastolic (congestive) heart failure: Secondary | ICD-10-CM | POA: Diagnosis not present

## 2019-07-04 DIAGNOSIS — R6520 Severe sepsis without septic shock: Secondary | ICD-10-CM | POA: Diagnosis not present

## 2019-07-04 DIAGNOSIS — E1165 Type 2 diabetes mellitus with hyperglycemia: Secondary | ICD-10-CM | POA: Diagnosis not present

## 2019-07-04 DIAGNOSIS — R600 Localized edema: Secondary | ICD-10-CM | POA: Diagnosis not present

## 2019-07-04 DIAGNOSIS — Z833 Family history of diabetes mellitus: Secondary | ICD-10-CM

## 2019-07-04 DIAGNOSIS — K760 Fatty (change of) liver, not elsewhere classified: Secondary | ICD-10-CM | POA: Diagnosis present

## 2019-07-04 DIAGNOSIS — L89899 Pressure ulcer of other site, unspecified stage: Secondary | ICD-10-CM | POA: Diagnosis not present

## 2019-07-04 DIAGNOSIS — F419 Anxiety disorder, unspecified: Secondary | ICD-10-CM | POA: Diagnosis present

## 2019-07-04 DIAGNOSIS — K810 Acute cholecystitis: Secondary | ICD-10-CM | POA: Diagnosis not present

## 2019-07-04 DIAGNOSIS — R627 Adult failure to thrive: Secondary | ICD-10-CM | POA: Diagnosis not present

## 2019-07-04 DIAGNOSIS — N1831 Chronic kidney disease, stage 3a: Secondary | ICD-10-CM | POA: Diagnosis not present

## 2019-07-04 DIAGNOSIS — I4510 Unspecified right bundle-branch block: Secondary | ICD-10-CM | POA: Diagnosis not present

## 2019-07-04 DIAGNOSIS — A419 Sepsis, unspecified organism: Secondary | ICD-10-CM

## 2019-07-04 DIAGNOSIS — I10 Essential (primary) hypertension: Secondary | ICD-10-CM | POA: Diagnosis present

## 2019-07-04 DIAGNOSIS — A0472 Enterocolitis due to Clostridium difficile, not specified as recurrent: Secondary | ICD-10-CM

## 2019-07-04 DIAGNOSIS — Z8619 Personal history of other infectious and parasitic diseases: Secondary | ICD-10-CM | POA: Diagnosis not present

## 2019-07-04 DIAGNOSIS — Z79891 Long term (current) use of opiate analgesic: Secondary | ICD-10-CM

## 2019-07-04 DIAGNOSIS — Z7982 Long term (current) use of aspirin: Secondary | ICD-10-CM

## 2019-07-04 DIAGNOSIS — Z79899 Other long term (current) drug therapy: Secondary | ICD-10-CM

## 2019-07-04 DIAGNOSIS — E43 Unspecified severe protein-calorie malnutrition: Secondary | ICD-10-CM | POA: Diagnosis not present

## 2019-07-04 DIAGNOSIS — Z8249 Family history of ischemic heart disease and other diseases of the circulatory system: Secondary | ICD-10-CM

## 2019-07-04 DIAGNOSIS — Z803 Family history of malignant neoplasm of breast: Secondary | ICD-10-CM

## 2019-07-04 DIAGNOSIS — F32A Depression, unspecified: Secondary | ICD-10-CM | POA: Diagnosis present

## 2019-07-04 DIAGNOSIS — R000 Tachycardia, unspecified: Secondary | ICD-10-CM | POA: Diagnosis not present

## 2019-07-04 DIAGNOSIS — Z794 Long term (current) use of insulin: Secondary | ICD-10-CM

## 2019-07-04 DIAGNOSIS — J9811 Atelectasis: Secondary | ICD-10-CM | POA: Diagnosis not present

## 2019-07-04 DIAGNOSIS — G4733 Obstructive sleep apnea (adult) (pediatric): Secondary | ICD-10-CM | POA: Diagnosis present

## 2019-07-04 DIAGNOSIS — I130 Hypertensive heart and chronic kidney disease with heart failure and stage 1 through stage 4 chronic kidney disease, or unspecified chronic kidney disease: Secondary | ICD-10-CM | POA: Diagnosis present

## 2019-07-04 DIAGNOSIS — Z880 Allergy status to penicillin: Secondary | ICD-10-CM

## 2019-07-04 DIAGNOSIS — R4182 Altered mental status, unspecified: Secondary | ICD-10-CM | POA: Diagnosis not present

## 2019-07-04 DIAGNOSIS — Z800 Family history of malignant neoplasm of digestive organs: Secondary | ICD-10-CM

## 2019-07-04 DIAGNOSIS — R601 Generalized edema: Secondary | ICD-10-CM

## 2019-07-04 DIAGNOSIS — R0902 Hypoxemia: Secondary | ICD-10-CM | POA: Diagnosis not present

## 2019-07-04 DIAGNOSIS — IMO0002 Reserved for concepts with insufficient information to code with codable children: Secondary | ICD-10-CM | POA: Diagnosis present

## 2019-07-04 DIAGNOSIS — L8992 Pressure ulcer of unspecified site, stage 2: Secondary | ICD-10-CM

## 2019-07-04 DIAGNOSIS — Z978 Presence of other specified devices: Secondary | ICD-10-CM | POA: Diagnosis not present

## 2019-07-04 DIAGNOSIS — Z515 Encounter for palliative care: Secondary | ICD-10-CM | POA: Diagnosis not present

## 2019-07-04 DIAGNOSIS — Z881 Allergy status to other antibiotic agents status: Secondary | ICD-10-CM | POA: Diagnosis not present

## 2019-07-04 DIAGNOSIS — S30811A Abrasion of abdominal wall, initial encounter: Secondary | ICD-10-CM | POA: Diagnosis not present

## 2019-07-04 DIAGNOSIS — R1030 Lower abdominal pain, unspecified: Secondary | ICD-10-CM

## 2019-07-04 DIAGNOSIS — Z8349 Family history of other endocrine, nutritional and metabolic diseases: Secondary | ICD-10-CM

## 2019-07-04 DIAGNOSIS — I951 Orthostatic hypotension: Secondary | ICD-10-CM | POA: Diagnosis not present

## 2019-07-04 DIAGNOSIS — R0602 Shortness of breath: Secondary | ICD-10-CM

## 2019-07-04 DIAGNOSIS — B9620 Unspecified Escherichia coli [E. coli] as the cause of diseases classified elsewhere: Secondary | ICD-10-CM | POA: Diagnosis not present

## 2019-07-04 DIAGNOSIS — R112 Nausea with vomiting, unspecified: Secondary | ICD-10-CM | POA: Diagnosis not present

## 2019-07-04 DIAGNOSIS — Z9049 Acquired absence of other specified parts of digestive tract: Secondary | ICD-10-CM | POA: Diagnosis not present

## 2019-07-04 LAB — CBC WITH DIFFERENTIAL/PLATELET
Abs Immature Granulocytes: 0.27 10*3/uL — ABNORMAL HIGH (ref 0.00–0.07)
Basophils Absolute: 0.1 10*3/uL (ref 0.0–0.1)
Basophils Relative: 1 %
Eosinophils Absolute: 0.4 10*3/uL (ref 0.0–0.5)
Eosinophils Relative: 3 %
HCT: 34.4 % — ABNORMAL LOW (ref 36.0–46.0)
Hemoglobin: 10.9 g/dL — ABNORMAL LOW (ref 12.0–15.0)
Immature Granulocytes: 2 %
Lymphocytes Relative: 23 %
Lymphs Abs: 3.9 10*3/uL (ref 0.7–4.0)
MCH: 28.5 pg (ref 26.0–34.0)
MCHC: 31.7 g/dL (ref 30.0–36.0)
MCV: 89.8 fL (ref 80.0–100.0)
Monocytes Absolute: 1.9 10*3/uL — ABNORMAL HIGH (ref 0.1–1.0)
Monocytes Relative: 12 %
Neutro Abs: 10.2 10*3/uL — ABNORMAL HIGH (ref 1.7–7.7)
Neutrophils Relative %: 59 %
Platelets: 469 10*3/uL — ABNORMAL HIGH (ref 150–400)
RBC: 3.83 MIL/uL — ABNORMAL LOW (ref 3.87–5.11)
RDW: 16.1 % — ABNORMAL HIGH (ref 11.5–15.5)
WBC Morphology: INCREASED
WBC: 16.8 10*3/uL — ABNORMAL HIGH (ref 4.0–10.5)
nRBC: 0.2 % (ref 0.0–0.2)

## 2019-07-04 LAB — D-DIMER, QUANTITATIVE: D-Dimer, Quant: 2.13 ug/mL-FEU — ABNORMAL HIGH (ref 0.00–0.50)

## 2019-07-04 LAB — C DIFFICILE QUICK SCREEN W PCR REFLEX
C Diff antigen: POSITIVE — AB
C Diff toxin: NEGATIVE

## 2019-07-04 LAB — GLUCOSE, CAPILLARY
Glucose-Capillary: 102 mg/dL — ABNORMAL HIGH (ref 70–99)
Glucose-Capillary: 153 mg/dL — ABNORMAL HIGH (ref 70–99)
Glucose-Capillary: 252 mg/dL — ABNORMAL HIGH (ref 70–99)

## 2019-07-04 LAB — COMPREHENSIVE METABOLIC PANEL
ALT: 9 U/L (ref 0–44)
AST: 17 U/L (ref 15–41)
Albumin: 1.6 g/dL — ABNORMAL LOW (ref 3.5–5.0)
Alkaline Phosphatase: 122 U/L (ref 38–126)
Anion gap: 10 (ref 5–15)
BUN: 72 mg/dL — ABNORMAL HIGH (ref 8–23)
CO2: 22 mmol/L (ref 22–32)
Calcium: 8 mg/dL — ABNORMAL LOW (ref 8.9–10.3)
Chloride: 100 mmol/L (ref 98–111)
Creatinine, Ser: 2.42 mg/dL — ABNORMAL HIGH (ref 0.44–1.00)
GFR calc Af Amer: 23 mL/min — ABNORMAL LOW (ref >60)
GFR calc non Af Amer: 20 mL/min — ABNORMAL LOW (ref >60)
Glucose, Bld: 164 mg/dL — ABNORMAL HIGH (ref 70–99)
Potassium: 4.2 mmol/L (ref 3.5–5.1)
Sodium: 132 mmol/L — ABNORMAL LOW (ref 135–145)
Total Bilirubin: 0.5 mg/dL (ref 0.3–1.2)
Total Protein: 5.8 g/dL — ABNORMAL LOW (ref 6.5–8.1)

## 2019-07-04 LAB — LACTIC ACID, PLASMA: Lactic Acid, Venous: 1.6 mmol/L (ref 0.5–1.9)

## 2019-07-04 LAB — SARS CORONAVIRUS 2 BY RT PCR (HOSPITAL ORDER, PERFORMED IN ~~LOC~~ HOSPITAL LAB): SARS Coronavirus 2: POSITIVE — AB

## 2019-07-04 LAB — FERRITIN: Ferritin: 156 ng/mL (ref 11–307)

## 2019-07-04 LAB — PROCALCITONIN: Procalcitonin: 1.28 ng/mL

## 2019-07-04 LAB — AMMONIA: Ammonia: 44 umol/L — ABNORMAL HIGH (ref 9–35)

## 2019-07-04 LAB — CLOSTRIDIUM DIFFICILE BY PCR, REFLEXED: Toxigenic C. Difficile by PCR: POSITIVE — AB

## 2019-07-04 LAB — BRAIN NATRIURETIC PEPTIDE: B Natriuretic Peptide: 66.4 pg/mL (ref 0.0–100.0)

## 2019-07-04 LAB — LACTATE DEHYDROGENASE: LDH: 132 U/L (ref 98–192)

## 2019-07-04 LAB — C-REACTIVE PROTEIN: CRP: 26 mg/dL — ABNORMAL HIGH (ref <1.0)

## 2019-07-04 LAB — FIBRINOGEN: Fibrinogen: 605 mg/dL — ABNORMAL HIGH (ref 210–475)

## 2019-07-04 MED ORDER — ZOLPIDEM TARTRATE 5 MG PO TABS: 5.0000 mg | ORAL_TABLET | Freq: Every evening | ORAL | Status: DC | PRN

## 2019-07-04 MED ORDER — PANTOPRAZOLE SODIUM 40 MG PO TBEC
40.0000 mg | DELAYED_RELEASE_TABLET | Freq: Every day | ORAL | Status: DC
  Administered 2019-07-04: 40 mg via ORAL
  Filled 2019-07-04: qty 1

## 2019-07-04 MED ORDER — DOCUSATE SODIUM 100 MG PO CAPS
100.0000 mg | ORAL_CAPSULE | Freq: Two times a day (BID) | ORAL | Status: DC
  Filled 2019-07-04: qty 1

## 2019-07-04 MED ORDER — INSULIN ASPART 100 UNIT/ML ~~LOC~~ SOLN
0.0000 [IU] | Freq: Three times a day (TID) | SUBCUTANEOUS | Status: DC
  Administered 2019-07-04: 17:00:00 3 [IU] via SUBCUTANEOUS
  Administered 2019-07-05 (×2): 8 [IU] via SUBCUTANEOUS
  Administered 2019-07-05: 5 [IU] via SUBCUTANEOUS
  Administered 2019-07-06: 17:00:00 2 [IU] via SUBCUTANEOUS
  Administered 2019-07-06 – 2019-07-08 (×4): 3 [IU] via SUBCUTANEOUS
  Administered 2019-07-09 (×3): 5 [IU] via SUBCUTANEOUS
  Administered 2019-07-10: 3 [IU] via SUBCUTANEOUS
  Administered 2019-07-11 (×2): 2 [IU] via SUBCUTANEOUS
  Administered 2019-07-12: 5 [IU] via SUBCUTANEOUS
  Administered 2019-07-12: 18:00:00 3 [IU] via SUBCUTANEOUS
  Administered 2019-07-12 – 2019-07-14 (×3): 2 [IU] via SUBCUTANEOUS
  Administered 2019-07-15 – 2019-07-16 (×5): 3 [IU] via SUBCUTANEOUS
  Administered 2019-07-17: 5 [IU] via SUBCUTANEOUS
  Administered 2019-07-17: 2 [IU] via SUBCUTANEOUS
  Administered 2019-07-17: 13:00:00 3 [IU] via SUBCUTANEOUS
  Administered 2019-07-18 – 2019-07-19 (×3): 2 [IU] via SUBCUTANEOUS

## 2019-07-04 MED ORDER — ENOXAPARIN SODIUM 40 MG/0.4ML ~~LOC~~ SOLN
40.0000 mg | SUBCUTANEOUS | Status: DC
  Administered 2019-07-04 – 2019-07-16 (×12): 40 mg via SUBCUTANEOUS
  Filled 2019-07-04 (×12): qty 0.4

## 2019-07-04 MED ORDER — LACTATED RINGERS IV SOLN
INTRAVENOUS | Status: DC
  Administered 2019-07-04 (×2): via INTRAVENOUS

## 2019-07-04 MED ORDER — ONDANSETRON HCL 4 MG PO TABS: 4.0000 mg | ORAL_TABLET | Freq: Four times a day (QID) | ORAL | Status: DC | PRN

## 2019-07-04 MED ORDER — INSULIN GLARGINE 100 UNIT/ML ~~LOC~~ SOLN
10.0000 [IU] | Freq: Every day | SUBCUTANEOUS | Status: DC
  Administered 2019-07-04 – 2019-07-20 (×16): 10 [IU] via SUBCUTANEOUS
  Filled 2019-07-04 (×18): qty 0.1

## 2019-07-04 MED ORDER — SODIUM CHLORIDE 0.9% FLUSH
3.0000 mL | INTRAVENOUS | Status: DC | PRN
  Administered 2019-07-11: 3 mL via INTRAVENOUS
  Filled 2019-07-04: qty 3

## 2019-07-04 MED ORDER — SODIUM CHLORIDE 0.9 % IV SOLN
200.0000 mg | Freq: Once | INTRAVENOUS | Status: AC
  Administered 2019-07-04: 17:00:00 200 mg via INTRAVENOUS
  Filled 2019-07-04: qty 40

## 2019-07-04 MED ORDER — SODIUM CHLORIDE 0.9% FLUSH
3.0000 mL | Freq: Two times a day (BID) | INTRAVENOUS | Status: DC
  Administered 2019-07-04: 3 mL via INTRAVENOUS

## 2019-07-04 MED ORDER — LACTATED RINGERS IV SOLN
INTRAVENOUS | Status: DC
  Administered 2019-07-04: 09:00:00 via INTRAVENOUS

## 2019-07-04 MED ORDER — BISACODYL 5 MG PO TBEC: 5.0000 mg | DELAYED_RELEASE_TABLET | Freq: Every day | ORAL | Status: DC | PRN

## 2019-07-04 MED ORDER — INSULIN GLARGINE 100 UNIT/ML ~~LOC~~ SOLN: 20.0000 [IU] | Freq: Every day | SUBCUTANEOUS | Status: DC

## 2019-07-04 MED ORDER — METHOCARBAMOL 500 MG PO TABS: 500.0000 mg | ORAL_TABLET | Freq: Three times a day (TID) | ORAL | Status: DC | PRN

## 2019-07-04 MED ORDER — DULOXETINE HCL 30 MG PO CPEP
30.0000 mg | ORAL_CAPSULE | Freq: Two times a day (BID) | ORAL | Status: DC
  Administered 2019-07-04 – 2019-08-01 (×53): 30 mg via ORAL
  Filled 2019-07-04 (×66): qty 1

## 2019-07-04 MED ORDER — ONDANSETRON HCL 4 MG/2ML IJ SOLN
4.0000 mg | Freq: Once | INTRAMUSCULAR | Status: AC
  Administered 2019-07-04: 05:00:00 4 mg via INTRAVENOUS
  Filled 2019-07-04: qty 2

## 2019-07-04 MED ORDER — GABAPENTIN 300 MG PO CAPS
300.0000 mg | ORAL_CAPSULE | Freq: Three times a day (TID) | ORAL | Status: DC
  Administered 2019-07-04 – 2019-08-01 (×77): 300 mg via ORAL
  Filled 2019-07-04 (×83): qty 1

## 2019-07-04 MED ORDER — METHYLPREDNISOLONE SODIUM SUCC 125 MG IJ SOLR
60.0000 mg | Freq: Two times a day (BID) | INTRAMUSCULAR | Status: DC
  Administered 2019-07-04 (×2): 60 mg via INTRAVENOUS
  Filled 2019-07-04 (×2): qty 2

## 2019-07-04 MED ORDER — ACETAMINOPHEN 325 MG PO TABS
650.0000 mg | ORAL_TABLET | Freq: Four times a day (QID) | ORAL | Status: DC | PRN
  Administered 2019-07-05 – 2019-07-31 (×7): 650 mg via ORAL
  Filled 2019-07-04 (×8): qty 2

## 2019-07-04 MED ORDER — ASPIRIN 81 MG PO CHEW
81.0000 mg | CHEWABLE_TABLET | Freq: Every day | ORAL | Status: DC
  Administered 2019-07-04 – 2019-08-01 (×26): 81 mg via ORAL
  Filled 2019-07-04 (×29): qty 1

## 2019-07-04 MED ORDER — ONDANSETRON HCL 4 MG/2ML IJ SOLN
4.0000 mg | Freq: Four times a day (QID) | INTRAMUSCULAR | Status: DC | PRN
  Administered 2019-07-13 – 2019-07-31 (×9): 4 mg via INTRAVENOUS
  Filled 2019-07-04 (×10): qty 2

## 2019-07-04 MED ORDER — POLYETHYLENE GLYCOL 3350 17 G PO PACK: 17.0000 g | PACK | Freq: Every day | ORAL | Status: DC | PRN

## 2019-07-04 MED ORDER — INSULIN ASPART 100 UNIT/ML ~~LOC~~ SOLN
0.0000 [IU] | Freq: Every day | SUBCUTANEOUS | Status: DC
  Administered 2019-07-04: 5 [IU] via SUBCUTANEOUS
  Administered 2019-07-08 – 2019-07-10 (×2): 2 [IU] via SUBCUTANEOUS

## 2019-07-04 MED ORDER — SODIUM CHLORIDE 0.9 % IV SOLN
100.0000 mg | INTRAVENOUS | Status: DC
  Filled 2019-07-04: qty 20

## 2019-07-04 MED ORDER — AMITRIPTYLINE HCL 10 MG PO TABS: 10.0000 mg | ORAL_TABLET | Freq: Every day | ORAL | Status: DC

## 2019-07-04 MED ORDER — VANCOMYCIN 50 MG/ML ORAL SOLUTION
500.0000 mg | Freq: Four times a day (QID) | ORAL | Status: DC
  Administered 2019-07-04 – 2019-07-08 (×14): 500 mg via ORAL
  Filled 2019-07-04 (×21): qty 10

## 2019-07-04 MED ORDER — LACTATED RINGERS IV BOLUS
1000.0000 mL | Freq: Once | INTRAVENOUS | Status: AC
  Administered 2019-07-04: 1000 mL via INTRAVENOUS

## 2019-07-04 MED ORDER — ROSUVASTATIN CALCIUM 5 MG PO TABS
5.0000 mg | ORAL_TABLET | Freq: Every day | ORAL | Status: DC
  Administered 2019-07-04 – 2019-08-01 (×26): 5 mg via ORAL
  Filled 2019-07-04 (×29): qty 1

## 2019-07-04 MED ORDER — SODIUM CHLORIDE 0.9 % IV BOLUS
500.0000 mL | Freq: Once | INTRAVENOUS | Status: AC
  Administered 2019-07-04: 500 mL via INTRAVENOUS

## 2019-07-04 MED ORDER — OXYCODONE HCL 5 MG PO TABS: 5.0000 mg | ORAL_TABLET | ORAL | Status: DC | PRN

## 2019-07-04 MED ORDER — LEVOTHYROXINE SODIUM 88 MCG PO TABS
88.0000 ug | ORAL_TABLET | Freq: Every day | ORAL | Status: DC
  Administered 2019-07-04 – 2019-08-02 (×26): 88 ug via ORAL
  Filled 2019-07-04 (×33): qty 1

## 2019-07-04 MED ORDER — BUPROPION HCL ER (SR) 100 MG PO TB12
200.0000 mg | ORAL_TABLET | Freq: Every day | ORAL | Status: DC
  Administered 2019-07-04 – 2019-07-11 (×7): 200 mg via ORAL
  Filled 2019-07-04 (×9): qty 2

## 2019-07-04 MED ORDER — SODIUM CHLORIDE 0.9 % IV SOLN: 250.0000 mL | INTRAVENOUS | Status: DC | PRN

## 2019-07-04 NOTE — ED Triage Notes (Signed)
Pt transported from [facility] for fall 07/02/19, diarrhea and AMS.  IV in place 400cc NS bolus given, pt appears to be hallucinating on arrival, nauseated, soiled depends containing exteremly malodorous dark green watery stool.  Pt did have +COVID at some point per documentation and remains on precautions at SNF.

## 2019-07-04 NOTE — ED Provider Notes (Signed)
[facility] EMERGENCY DEPARTMENT Provider Note   CSN: [id number] Arrival date & time: 07/04/19  [id number]     History   Chief Complaint Chief Complaint  Patient presents with   Fall    HPI Alice Kemp is a 68 y.o. female.     The history is provided by the patient and medical records.     Level 5 caveat: Altered mental status.  68 year old female with history of anxiety, arthritis, back pain status post decompression on 05/30/2019 with Dr. Saintclair Halsted, chronic kidney disease, depression, diabetes, congestive heart failure, hyperlipidemia, thyroid disease, sleep apnea, pulmonary hypertension, presenting to the ED with altered mental status.  Patient is currently undergoing rehab at [facility] place since discharge from the hospital following her lumbar decompression.  She had a fall on 07/02/2019, but was not sent here for evaluation.  Approximately 12 hours ago nursing staff noticed that she seemed altered so decided to send her in.  Unclear as to the delay in transport.  Patient has been having diarrhea but no noted fevers.  Blood pressure was low into the 123XX123 systolic with EMS, improved with IV fluids.  She is not currently on antibiotics.  Patient did test positive for COVID in June and was hospitalized at that time, she has since tested negative.  There is not been any recurrence of respiratory symptoms, cough, etc.  At baseline, patient is AAOx3, conversant.  Patient has MOST form-- indicates no intubation/compression but ok with other supportive treatments (IVF, antibiotics, consider BIPAP, etc).  Past Medical History:  Diagnosis Date   Anxiety    Arthritis    Back pain    Benign neoplasm of colon 06/29/2012   Breast abscess 07/2011   left; s/p I&D + abx by gen surg   Cervical radiculopathy at C6    CKD (chronic kidney disease) stage 3, GFR 30-59 ml/min ([place])    follows with renal   Constipation    Depression    Diabetes mellitus type II    Diastolic  heart failure ([place]) 09/2013 echo   Gall bladder inflammation    Glaucoma    Gout    Hyperlipidemia    Hypothyroidism    Joint pain    Kidney disease    Leg pain    Morbid obesity ([place])    OBSTRUCTIVE SLEEP APNEA 12/23/2010   npsg 2012:  AHI 25/hr, severe desat to 68%.   autoset 2013: optimal pressure 13cm    PERICARDIAL EFFUSION 12/12/2010   Pulmonary hypertension (H[facility])    Shortness of breath    Skin lesion of breast 07/09/2012   Swelling of extremity    Unspecified hypothyroidism    Vitamin B12 deficiency    Vitamin D deficiency     Patient Active Problem List   Diagnosis Date Noted   HNP (herniated nucleus pulposus), lumbar 05/30/2019   Cellulitis of abdominal wall 05/24/2019   AKI (acute kidney injury) ([place]) 05/24/2019   Hyponatremia 05/24/2019   Anemia 05/24/2019   Suspected Covid-19 Virus Infection 03/28/2019   Essential hypertension 10/15/2017   Pain in both lower extremities 01/27/2017   Lumbar radiculopathy 06/02/2016   Leg weakness, bilateral 06/02/2016   Gout 11/29/2015   Palpitations    Chronic diastolic heart failure ([place]) 09/23/2013   CKD (chronic kidney disease), stage III ([place])    Depression 02/11/2013   Benign neoplasm of colon 06/29/2012   Cervical radiculopathy at C6 07/24/2011   Morbid obesity ([place]) 02/14/2011   Hypothyroidism 02/14/2011   OBSTRUCTIVE SLEEP  APNEA 12/23/2010   PERICARDIAL EFFUSION 12/12/2010   Uncontrolled type 2 diabetes mellitus with stage 3 chronic kidney disease ([place]) 12/02/2010   EDEMA 12/02/2010   DYSPNEA 12/02/2010    Past Surgical History:  Procedure Laterality Date   BREAST BIOPSY  1990   Left   CATARACT EXTRACTION     COLONOSCOPY  06/29/2012   Procedure: COLONOSCOPY;  Surgeon: Ladene Artist, MD,FACG;  Location: [facility];  Service: Endoscopy;  Laterality: N/A;   LUMBAR LAMINECTOMY/DECOMPRESSION MICRODISCECTOMY Left 05/30/2019   Procedure: Left Decompressive Lumbar  Laminectomy and Microdiscectomy lumbar three-four;  Surgeon: Kary Kos, MD;  Location: [place];  Service: Neurosurgery;  Laterality: Left;   SHOULDER SURGERY  2010   Left   TONSILLECTOMY  1964     OB History    Gravida  0   Para  0   Term  0   Preterm  0   AB  0   Living  0     SAB  0   TAB  0   Ectopic  0   Multiple  0   Live Births  0            Home Medications    Prior to Admission medications   Medication Sig Start Date End Date Taking? Authorizing Provider  allopurinol (ZYLOPRIM) 100 MG tablet TAKE 1 TABLET BY MOUTH ONCE DAILY Patient taking differently: Take 100 mg by mouth daily as needed (gout).  06/28/18   [name], MD  amitriptyline (ELAVIL) 10 MG tablet Take 10 mg by mouth at bedtime.  03/26/15   [provider]  aspirin 81 MG tablet Take 81 mg by mouth daily. Reported on 12/24/2015    [provider]  benzonatate (TESSALON) 100 MG capsule Take 1 capsule (100 mg total) by mouth 3 (three) times daily as needed for cough. 04/01/19   Hoyt Koch, MD  bisacodyl (DULCOLAX) 10 MG suppository Place 1 suppository (10 mg total) rectally daily as needed for moderate constipation. 06/05/19   Sheikh, Omair Latif, DO  buPROPion (WELLBUTRIN SR) 200 MG 12 hr tablet Take 1 tablet (200 mg total) by mouth daily. 12/15/18   Abby Potash, PA-C  celecoxib (CELEBREX) 100 MG capsule Take 1 capsule (100 mg total) by mouth daily as needed. Patient taking differently: Take 100 mg by mouth daily as needed for mild pain.  05/19/19   Burns, Claudina Lick, MD  colchicine 0.6 MG tablet TAKE 2 TABLETS BY MOUTH AT ONSET OF FLARE THEN TAKE 1 TABLET 1 HOUR LATER Patient taking differently: Take 0.6-1.2 mg by mouth See admin instructions. TAKE 2 TABLETS BY MOUTH AT ONSET OF FLARE THEN TAKE 1 TABLET 1 HOUR LATER 06/28/18   Burns, Claudina Lick, MD  DULoxetine (CYMBALTA) 30 MG capsule Take 1 capsule (30 mg total) by mouth 2 (two) times daily. 05/11/18   Burns, Claudina Lick, MD    fluticasone (CUTIVATE) 0.05 % cream APPLY 3 TIMES A DAY AS NEEDED FOR ITCHING Patient taking differently: Apply 1 application topically 3 (three) times daily as needed (itching).  07/23/12   Renato Shin, MD  furosemide (LASIX) 80 MG tablet Take 1 tablet (80 mg total) by mouth daily. 05/28/15   Rowe Clack, MD  gabapentin (NEURONTIN) 300 MG capsule Take 300 mg by mouth 3 (three) times daily.  05/05/16   [provider]  levothyroxine (SYNTHROID, LEVOTHROID) 88 MCG tablet Take 88 mcg by mouth daily before breakfast.    [provider]  losartan (  COZAAR) 25 MG tablet Take 1 tablet (25 mg total) by mouth daily. -- Office visit needed for further refills 11/12/17   [name], MD  Magnesium 100 MG CAPS Take 2 capsules by mouth daily.    [provider]  meclizine (ANTIVERT) 25 MG tablet Take 0.5-1 tablets (12.5-25 mg total) by mouth 3 (three) times daily as needed for dizziness. 01/05/19   [name], MD  methocarbamol (ROBAXIN) 500 MG tablet Take 1 tablet (500 mg total) by mouth every 8 (eight) hours as needed for muscle spasms. 06/05/19   Raiford Noble Latif, DO  mupirocin ointment (BACTROBAN) 2 % Apply 1 application topically 2 (two) times daily. Patient taking differently: Apply 1 application topically 2 (two) times daily as needed (rash).  01/07/19   Burns, Claudina Lick, MD  NOVOLOG MIX 70/30 FLEXPEN (70-30) 100 UNIT/ML FlexPen Inject 55-80 Units into the skin 3 (three) times daily. 80 units breakfast, 55 units lunch, 55 units dinner 01/06/19   [provider]  ondansetron (ZOFRAN) 4 MG tablet Take 1 tablet (4 mg total) by mouth every 6 (six) hours as needed for nausea or vomiting. 06/05/19   Raiford Noble Hilham, DO  [facility] VERIO test strip Check BS twice daily 12/15/18   Abby Potash, PA-C  oxyCODONE 10 MG TABS Take 1 tablet (10 mg total) by mouth every 6 (six) hours as needed for severe pain. 06/05/19   Sheikh, Omair Latif, DO  pantoprazole (PROTONIX) 40 MG  tablet Take 1 tablet (40 mg total) by mouth at bedtime. 06/05/19   Sheikh, Omair Latif, DO  polyethylene glycol (MIRALAX / GLYCOLAX) 17 g packet Take 17 g by mouth daily as needed. 06/05/19   Raiford Noble Latif, DO  rosuvastatin (CRESTOR) 5 MG tablet Take 1 tablet (5 mg total) by mouth daily. 09/24/17   Dennard Nip D, MD  senna-docusate (SENOKOT-S) 8.6-50 MG tablet Take 1 tablet by mouth 2 (two) times daily. 06/05/19   Raiford Noble Latif, DO  spironolactone (ALDACTONE) 25 MG tablet TAKE 1 TABLET (25 MG TOTAL) BY MOUTH DAILY. 06/28/18   [name], MD  vitamin B-12 (CYANOCOBALAMIN) 500 MCG tablet Take 500 mcg by mouth 3 (three) times a week.     [provider]    Family History Family History  Problem Relation Age of Onset   Hypertension Mother    Heart disease Mother    Heart attack Mother    Thyroid disease Mother    Diabetes Father    Heart disease Father    Skin cancer Father    Cancer Father        skin   Heart attack Father    Hyperlipidemia Father    Colon polyps Father    Depression Father    Hypertension Brother    Skin cancer Paternal Grandfather    Colon cancer Paternal Grandfather 75   Breast cancer Paternal Aunt    Cancer Paternal Aunt        breast   Diabetes Paternal Uncle    Diabetes Maternal Grandfather    Sudden death Neg Hx     Social History Social History   Tobacco Use   Smoking status: Never Smoker   Smokeless tobacco: Never Used  Substance Use Topics   Alcohol use: No    Alcohol/week: 0.0 standard drinks    Frequency: Never   Drug use: No     Allergies   Morphine and related, Sulfa antibiotics, and Penicillins   Review of Systems Review  of Systems  Unable to perform ROS: Mental status change     Physical Exam Updated Vital Signs BP 130/78 (BP Location: Right Arm)    Pulse (!) 103    Temp 97.7 F (36.5 C) (Oral)    Resp 17    SpO2 94%   Physical Exam Vitals signs and nursing note reviewed.    Constitutional:      Appearance: She is well-developed.     Comments: Morbidly obese, altered, grabbing at objects in the air that are not there, repeatedly talking about "a door"  HENT:     Head: Normocephalic and atraumatic.  Eyes:     Conjunctiva/sclera: Conjunctivae normal.     Pupils: Pupils are equal, round, and reactive to light.  Neck:     Musculoskeletal: Normal range of motion.  Cardiovascular:     Rate and Rhythm: Regular rhythm. Tachycardia present.     Heart sounds: Normal heart sounds.     Comments: HR 103 during exam Pulmonary:     Effort: Pulmonary effort is normal. No respiratory distress.     Breath sounds: Normal breath sounds. No stridor.  Abdominal:     General: Bowel sounds are normal.     Palpations: Abdomen is soft.     Tenderness: There is no abdominal tenderness. There is no rebound.     Comments: Obese abdomen but soft and overall non-tender, diarrheal episode during exam  Musculoskeletal: Normal range of motion.  Skin:    General: Skin is warm and dry.  Neurological:     Mental Status: She is alert.     Comments: Awake, disoriented to place, time, situation, she does respond to her name; fidgeting, spontaneously moving all 4 extremities      ED Treatments / Results  Labs (all labs ordered are listed, but only abnormal results are displayed) Labs Reviewed  SARS CORONAVIRUS 2 ([place] [facility]) - Abnormal; Notable for the following components:      Result Value   SARS Coronavirus 2 POSITIVE (*)    All other components within normal limits  CBC WITH DIFFERENTIAL/PLATELET - Abnormal; Notable for the following components:   WBC 16.8 (*)    RBC 3.83 (*)    Hemoglobin 10.9 (*)    HCT 34.4 (*)    RDW 16.1 (*)    Platelets 469 (*)    Neutro Abs 10.2 (*)    Monocytes Absolute 1.9 (*)    Abs Immature Granulocytes 0.27 (*)    All other components within normal limits  COMPREHENSIVE METABOLIC PANEL - Abnormal;  Notable for the following components:   Sodium 132 (*)    Glucose, Bld 164 (*)    BUN 72 (*)    Creatinine, Ser 2.42 (*)    Calcium 8.0 (*)    Total Protein 5.8 (*)    Albumin 1.6 (*)    GFR calc non Af Amer 20 (*)    GFR calc Af Amer 23 (*)    All other components within normal limits  AMMONIA - Abnormal; Notable for the following components:   Ammonia 44 (*)    All other components within normal limits  CULTURE, BLOOD (ROUTINE X 2)  CULTURE, BLOOD (ROUTINE X 2)  URINE CULTURE  C DIFFICILE QUICK SCREEN W PCR REFLEX  LACTIC ACID, PLASMA  URINALYSIS, ROUTINE W REFLEX MICROSCOPIC  GI PATHOGEN PANEL BY PCR, STOOL    EKG None  Radiology Dg Chest Port 1 View  Result Date: 07/04/2019  CLINICAL DATA:  Altered mental status and hypotension EXAM: PORTABLE CHEST 1 VIEW COMPARISON:  06/03/2019 FINDINGS: Mild cardiomegaly accentuated by low volumes. Otherwise normal mediastinal contours. There is no edema, consolidation, effusion, or pneumothorax. Postoperative proximal left humerus. IMPRESSION: No evidence of acute disease. Electronically Signed   By: Monte Fantasia M.D.   On: 07/04/2019 05:26    Procedures Procedures (including critical care time)  CRITICAL CARE Performed by: Larene Pickett   Total critical care time: 45 minutes  Critical care time was exclusive of separately billable procedures and treating other patients.  Critical care was necessary to treat or prevent imminent or life-threatening deterioration.  Critical care was time spent personally by me on the following activities: development of treatment plan with patient and/or surrogate as well as nursing, discussions with consultants, evaluation of patient's response to treatment, examination of patient, obtaining history from patient or surrogate, ordering and performing treatments and interventions, ordering and review of laboratory studies, ordering and review of radiographic studies, pulse oximetry and  re-evaluation of patient's condition.   Medications Ordered in ED Medications  ondansetron (ZOFRAN) injection 4 mg (4 mg Intravenous Given 07/04/19 0511)  sodium chloride 0.9 % bolus 500 mL (500 mLs Intravenous New Bag/Given 07/04/19 0512)     Initial Impression / Assessment and Plan / ED Course  I have reviewed the triage vital signs and the nursing notes.  Pertinent labs & imaging results that were available during my care of the patient were reviewed by me and considered in my medical decision making (see chart for details).  68 year old female here with altered mental status.  She has been recovering at [facility] facility for rehab after her lumbar decompression with Dr. Saintclair Halsted last month.  She is been altered for approximately 12 hours.  She was hypotensive with EMS into the 123XX123 systolic but improved with IV fluids.  She has been having some diarrhea.  She is not currently on antibiotics.  She reportedly did have a fall 2 days ago but did not have any formal evaluation for this.  She is afebrile and nontoxic but does appear confused and encephalopathic.  She is continuously talking about "a door" and picking at things in the air that are not there.  She did have an episode of diarrhea on arrival.  Her abdomen is overall soft and benign.  Vitals are overall stable.  Work-up pending including labs, chest x-ray, CT of the head.  As she has had diarrhea will send stool studies, concern for possibly C. Diff given hospitalization and currently at SNF.  6:14 AM COVID test today is positive.  She was initially COVID positive in June, positive again on 05/24/2019, however tested negative on 05/30/2019 so this is likely repeat infection. Labs with leukocytosis but normal lactate.  She does have evidence of AKI, receiving IV fluids.  C. difficile and stool studies are pending.  Chest x-ray without acute findings.  CT of the head done but not formally read, no obvious acute abnormalities on my brief  review.  6:20 AM Patient's BP improving with IVF, now 105/62.  O2 sats did drop a bit to around 88-89% when lying.  Placed on 2L with improvement to 97%.  Given known COVID and recent surgery, consider PE, however given her renal function cannot get CTA.  Will discuss with hospitalist.  6:32 AM Discussed with hospitalist, Dr. Marlowe Sax-- will admit, likely will need to follow-up on stool studies and reassess mental status to see if she is  able to go to [facility].  Final Clinical Impressions(s) / ED Diagnoses   Final diagnoses:  Altered mental status, unspecified altered mental status type  AKI (acute kidney injury) [facility])  COVID-19    ED Discharge Orders    None       Larene Pickett, PA-C 07/04/19 [id number]    Merrily Pew, MD 07/05/19 [phone]

## 2019-07-04 NOTE — Progress Notes (Signed)
   07/04/19 1111  Vitals  Temp 98.5 F (36.9 C)  Temp Source Oral  BP (!) 88/55  MAP (mmHg) 65  BP Location Right Arm  BP Method Automatic  Patient Position (if appropriate) Lying  Pulse Rate 93  ECG Heart Rate 94  Resp 20  Oxygen Therapy  SpO2 98 %  O2 Device Nasal Cannula  O2 Flow Rate (L/min) 2 L/min  Pain Assessment  Pain Scale 0-10  Pain Score 0  MEWS Score  MEWS RR 0  MEWS Pulse 0  MEWS Systolic 1  MEWS LOC 0  MEWS Temp 0  MEWS Score 1  MEWS Score Color Green   Pt arrived to 1W139 at this time. VS above. Paged physician regarding BP.

## 2019-07-04 NOTE — H&P (Addendum)
History and Physical    Alice Kemp [id number] DOB: June 15, 1951 DOA: 07/04/2019  PCP: [name], MD Consultants:  Saintclair Halsted - neurosurgery; Chalmers Cater - endocrinology; Posey Pronto - nephrology; [place] Patient coming from: [facility]; NOK: Mother, Myani Busam, [phone]  Chief Complaint:  fall  HPI: Alice Kemp is a 68 y.o. female with medical history significant of hypothyroidism; OSA; morbid obesity (BMI 44); HLD; DM; diastolic CHF; and stage 3 CKD presenting with a fall.  She reportedly fell on 9/26 without being sent in for evaluation.  Last evening, the facility staff noticed AMS.  She has been having diarrhea without fevers.  She is somewhat confused and not able to provide meaningful history; she reports that she is ready to go home and that she has dogs there waiting for her.  She was able to report that her mother is her NOK.    [place] called last night because she was having back pain.  She has told her mother before that she was confused, and her mother thought it was due to medications.  She has never had a problem with confusion.  She calls her mother 1-2 times daily from SNF but she doesn't have as much to say to her; she has not been confused on the phone but she has been hard to understand.  She had COVID-19 in early June and got over it; her mother had it too after being exposed to her hairdresser and she gave it to her.  She was admitted from home on 8/18-30 for spinal stenosis s/p lumbar laminectomy and microdiscectomy on 8/24 and was discharged to SNF.  While hospitalized she developed abdominal wound infection/cellulitis.  She reported COVID exposure without testing in June; she was positive during the last hospitalization and repeat testing was negative prior to d/c.    ED Course:  [place], per Dr. Marlowe Sax:  68 year old female coming in from [place] where she was sent last month after a lumbar decompression surgery. She is now presenting for  evaluation of encephalopathy and green diarrhea. Confused and disoriented. At baseline AAO x3. COVID positive. Concern for reinfection as she was positive for COVID in June but then negative in mid August. Now positive again. No fevers. Slightly tachycardic and blood pressure was soft which has now improved after 1 L fluid bolus. Head CT negative. Neuro exam nonfocal. White count 16. Normal lactic acid. UA pending. C. difficile panel has been sent out. Chest x-ray negative. Oxygen saturation 88% on room air and was placed on 2L supplemental oxygen. Has an AKI. Unable to do CTA due to poor renal function.   Bed request has been placed for [facility] given encephalopathy. May need further neurologic work-up. Please assess the patient and change bed request to [facility] if it is felt appropriate.  Review of Systems: As per HPI; otherwise review of systems reviewed and negative.  This is suspect due to her AMS.   Past Medical History:  Diagnosis Date  . Anxiety   . Arthritis   . Back pain   . Benign neoplasm of colon 06/29/2012  . Breast abscess 07/2011   left; s/p I&D + abx by gen surg  . Cervical radiculopathy at C6   . CKD (chronic kidney disease) stage 3, GFR 30-59 ml/min (HCC)    follows with renal  . Constipation   . Depression   . Diabetes mellitus type II   . Diastolic heart failure ([place]) 09/2013 echo  . Gall  bladder inflammation   . Glaucoma   . Gout   . Hyperlipidemia   . Hypothyroidism   . Morbid obesity ([place])   . OBSTRUCTIVE SLEEP APNEA 12/23/2010   npsg 2012:  AHI 25/hr, severe desat to 68%.   autoset 2013: optimal pressure 13cm   . PERICARDIAL EFFUSION 12/12/2010  . Pulmonary hypertension ([place])   . Vitamin B12 deficiency   . Vitamin D deficiency     Past Surgical History:  Procedure Laterality Date  . BREAST BIOPSY  1990   Left  . CATARACT EXTRACTION    . COLONOSCOPY  06/29/2012   Procedure: COLONOSCOPY;  Surgeon: Ladene Artist, MD,FACG;   Location: [facility];  Service: Endoscopy;  Laterality: N/A;  . LUMBAR LAMINECTOMY/DECOMPRESSION MICRODISCECTOMY Left 05/30/2019   Procedure: Left Decompressive Lumbar Laminectomy and Microdiscectomy lumbar three-four;  Surgeon: Kary Kos, MD;  Location: [facility];  Service: Neurosurgery;  Laterality: Left;  . SHOULDER SURGERY  2010   Left  . TONSILLECTOMY  1964    Social History   Socioeconomic History  . Marital status: Single    Spouse name: Not on file  . Number of children: 0  . Years of education: Not on file  . Highest education level: Not on file  Occupational History  . 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 Financial resource strain: Somewhat hard  . Food insecurity    Worry: Never true    Inability: Never true  . Transportation needs    Medical: No    Non-medical: No  Tobacco Use  . Smoking status: Never Smoker  . Smokeless tobacco: Never Used  Substance and Sexual Activity  . Alcohol use: No    Alcohol/week: 0.0 standard drinks    Frequency: Never  . Drug use: No  . Sexual activity: Not on file  Lifestyle  . Physical activity    Days per week: 0 days    Minutes per session: 0 min  . Stress: Only a little  Relationships  . Social Herbalist on phone: Patient refused    Gets together: Patient refused    Attends religious service: Patient refused    Active member of club or organization: Patient refused    Attends meetings of clubs or organizations: Patient refused    Relationship status: Patient refused  . Intimate partner violence    Fear of current or ex partner: Patient refused    Emotionally abused: Patient refused    Physically abused: Patient refused    Forced sexual activity: Patient refused  Other Topics Concern  . Not on file  Social History Narrative   Lives alone.       Allergies  Allergen Reactions  . Morphine And Related Other (See  Comments)    Behavioral Issues  . Sulfa Antibiotics   . Penicillins Rash    Occurred as infant    Family History  Problem Relation Age of Onset  . Hypertension Mother   . Heart disease Mother   . Heart attack Mother   . Thyroid disease Mother   . Diabetes Father   . Heart disease Father   . Skin cancer Father   . Cancer Father        skin  . Heart attack Father   . Hyperlipidemia Father   . Colon polyps Father   . Depression Father   . Hypertension Brother   . Skin  cancer Paternal Grandfather   . Colon cancer Paternal Grandfather 44  . Breast cancer Paternal Aunt   . Cancer Paternal Aunt        breast  . Diabetes Paternal Uncle   . Diabetes Maternal Grandfather   . Sudden death Neg Hx     Prior to Admission medications   Medication Sig Start Date End Date Taking? Authorizing Provider  allopurinol (ZYLOPRIM) 100 MG tablet TAKE 1 TABLET BY MOUTH ONCE DAILY Patient taking differently: Take 100 mg by mouth daily as needed (gout).  06/28/18   [name], MD  amitriptyline (ELAVIL) 10 MG tablet Take 10 mg by mouth at bedtime.  03/26/15   [provider]  aspirin 81 MG tablet Take 81 mg by mouth daily. Reported on 12/24/2015    [provider]  benzonatate (TESSALON) 100 MG capsule Take 1 capsule (100 mg total) by mouth 3 (three) times daily as needed for cough. 04/01/19   Hoyt Koch, MD  bisacodyl (DULCOLAX) 10 MG suppository Place 1 suppository (10 mg total) rectally daily as needed for moderate constipation. 06/05/19   Sheikh, Omair Latif, DO  buPROPion (WELLBUTRIN SR) 200 MG 12 hr tablet Take 1 tablet (200 mg total) by mouth daily. 12/15/18   Abby Potash, PA-C  celecoxib (CELEBREX) 100 MG capsule Take 1 capsule (100 mg total) by mouth daily as needed. Patient taking differently: Take 100 mg by mouth daily as needed for mild pain.  05/19/19   Burns, Claudina Lick, MD  colchicine 0.6 MG tablet TAKE 2 TABLETS BY MOUTH AT ONSET OF FLARE THEN TAKE 1 TABLET  1 HOUR LATER Patient taking differently: Take 0.6-1.2 mg by mouth See admin instructions. TAKE 2 TABLETS BY MOUTH AT ONSET OF FLARE THEN TAKE 1 TABLET 1 HOUR LATER 06/28/18   Burns, Claudina Lick, MD  DULoxetine (CYMBALTA) 30 MG capsule Take 1 capsule (30 mg total) by mouth 2 (two) times daily. 05/11/18   Burns, Claudina Lick, MD  fluticasone (CUTIVATE) 0.05 % cream APPLY 3 TIMES A DAY AS NEEDED FOR ITCHING Patient taking differently: Apply 1 application topically 3 (three) times daily as needed (itching).  07/23/12   Renato Shin, MD  furosemide (LASIX) 80 MG tablet Take 1 tablet (80 mg total) by mouth daily. 05/28/15   Rowe Clack, MD  gabapentin (NEURONTIN) 300 MG capsule Take 300 mg by mouth 3 (three) times daily.  05/05/16   [provider]  levothyroxine (SYNTHROID, LEVOTHROID) 88 MCG tablet Take 88 mcg by mouth daily before breakfast.    [provider]  losartan (COZAAR) 25 MG tablet Take 1 tablet (25 mg total) by mouth daily. -- Office visit needed for further refills 11/12/17   [name], MD  Magnesium 100 MG CAPS Take 2 capsules by mouth daily.    [provider]  meclizine (ANTIVERT) 25 MG tablet Take 0.5-1 tablets (12.5-25 mg total) by mouth 3 (three) times daily as needed for dizziness. 01/05/19   [name], MD  methocarbamol (ROBAXIN) 500 MG tablet Take 1 tablet (500 mg total) by mouth every 8 (eight) hours as needed for muscle spasms. 06/05/19   Raiford Noble Latif, DO  mupirocin ointment (BACTROBAN) 2 % Apply 1 application topically 2 (two) times daily. Patient taking differently: Apply 1 application topically 2 (two) times daily as needed (rash).  01/07/19   Burns, Claudina Lick, MD  NOVOLOG MIX 70/30 FLEXPEN (70-30) 100 UNIT/ML FlexPen Inject 55-80 Units into the skin 3 (three) times  daily. 80 units breakfast, 55 units lunch, 55 units dinner 01/06/19   [provider]  ondansetron (ZOFRAN) 4 MG tablet Take 1 tablet (4 mg total) by mouth every 6 (six) hours as  needed for nausea or vomiting. 06/05/19   Raiford Noble [place], DO  [facility] VERIO test strip Check BS twice daily 12/15/18   Abby Potash, PA-C  oxyCODONE 10 MG TABS Take 1 tablet (10 mg total) by mouth every 6 (six) hours as needed for severe pain. 06/05/19   Sheikh, Omair Latif, DO  pantoprazole (PROTONIX) 40 MG tablet Take 1 tablet (40 mg total) by mouth at bedtime. 06/05/19   Sheikh, Omair Latif, DO  polyethylene glycol (MIRALAX / GLYCOLAX) 17 g packet Take 17 g by mouth daily as needed. 06/05/19   Raiford Noble Latif, DO  rosuvastatin (CRESTOR) 5 MG tablet Take 1 tablet (5 mg total) by mouth daily. 09/24/17   Dennard Nip D, MD  senna-docusate (SENOKOT-S) 8.6-50 MG tablet Take 1 tablet by mouth 2 (two) times daily. 06/05/19   Raiford Noble Latif, DO  spironolactone (ALDACTONE) 25 MG tablet TAKE 1 TABLET (25 MG TOTAL) BY MOUTH DAILY. 06/28/18   [name], MD  vitamin B-12 (CYANOCOBALAMIN) 500 MCG tablet Take 500 mcg by mouth 3 (three) times a week.     [provider]    Physical Exam: Vitals:   07/04/19 0800 07/04/19 0815 07/04/19 0830 07/04/19 0852  BP: 109/68 107/69 (!) 103/56 93/72  Pulse: 100 100 99 95  Resp: 20 18 18 13   Temp:      TempSrc:      SpO2: 97% 95% 98% 97%  Weight:         . General:  Appears calm and comfortable and is NAD; she is morbidly obese, appears sedentary, and is unable to provide a clear history due to confusion Eyes:  PERRL, EOMI, normal lids, iris . ENT:  grossly normal hearing, lips & tongue, mildly dry mm . Neck:  no LAD, masses or thyromegaly . Cardiovascular:  RRR, no m/r/g. No LE edema.  Marland Kitchen Respiratory:   CTA bilaterally with no wheezes/rales/rhonchi.  Normal respiratory effort.  On 2L [place] O2 . Abdomen:  soft, NT, ND, NABS . Skin:  no rash or induration seen on limited exam . Musculoskeletal:  grossly normal tone BUE/BLE, good ROM, no bony abnormality . Psychiatric:  blunted mood and affect, speech fluent and appropriate, AOx2, mild  flight of ideas without clear ability to answer questions about why she is in the ER . Neurologic:  CN 2-12 grossly intact, moves all extremities in coordinated fashion, sensation intact    Radiological Exams on Admission: Ct Head Wo Contrast  Result Date: 07/04/2019 CLINICAL DATA:  Mental status. Fall 2 days ago. Positive COVID-25 May 2019. EXAM: CT HEAD WITHOUT CONTRAST TECHNIQUE: Contiguous axial images were obtained from the base of the skull through the vertex without intravenous contrast. COMPARISON:  None. FINDINGS: Brain: Ventricles, cisterns and other CSF spaces are normal. There is mild chronic ischemic microvascular disease present. There is no mass, mass effect, shift of midline structures or acute hemorrhage. No evidence of acute infarction. Vascular: No hyperdense vessel or unexpected calcification. Skull: Normal. Negative for fracture or focal lesion. Sinuses/Orbits: No acute finding. Other: None. IMPRESSION: No acute findings. Mild chronic ischemic microvascular disease. Electronically Signed   By: Marin Olp M.D.   On: 07/04/2019 07:11   Dg Chest Port 1 View  Result Date: 07/04/2019 CLINICAL DATA:  Altered mental status  and hypotension EXAM: PORTABLE CHEST 1 VIEW COMPARISON:  06/03/2019 FINDINGS: Mild cardiomegaly accentuated by low volumes. Otherwise normal mediastinal contours. There is no edema, consolidation, effusion, or pneumothorax. Postoperative proximal left humerus. IMPRESSION: No evidence of acute disease. Electronically Signed   By: Monte Fantasia M.D.   On: 07/04/2019 05:26    EKG: Independently reviewed.  Sinus tachycardia with rate 104; RBBB;  no evidence of acute ischemia   Labs on Admission: I have personally reviewed the available labs and imaging studies at the time of the admission.  Pertinent labs:   Na++ 132 Glucose 164 BUN 72/Creatinine 2.42/GFR 20; 22/1.33/41 on 8/31 Albumin 1.6 NH4 44 Lactate 1.6 WBC 16.8 Hgb 10.9 Platelets 469 GI  pathogen panel pending COVID POSITIVE; negative on 8/29; POSITIVE on 8/18 Blood cultures pending BNP 66.4 LDH 132 Fibrinogen 605 D-dimer 2.13 CRP, ferritin, procalcitonin are pending   Assessment/Plan Active Problems:   AMS (altered mental status)   COVID-19 infection with encephalopathy -Patient presenting with AMS -She was previously ill in June with presumed COVID-19 infection (her mother had documented infection and the patient became sick after contact with her mother) -She improved and then had severe back pain (see below) necessitating hospitalization and was again found to be COVID positive; at that time she appeared to be asymptomatic -She returned to the ER overnight with AMS and diarrhea and COVID testing is again positive -She was hypotensive periodically, fluid-responsive, and has AKI (see below) -She had a fall at her facility on 9/26 without evaluation but does not have apparent injury from that fall -In the ER, she has remained confused and unable to provide a clear history; she is picking at her pulse oximeter and appears confused about why it is on her finger -COVID test is once again positive; this is thought to be either recurrent infection or refractory infection -She does not have a usual home O2 requirement and is currently requiring 2L [place] O2 after dropping to 88% at rest -The patient has comorbidities which may increase the risk for ARDS/MODS including: age, DM, obesity -Pertinent labs concerning for COVID include increased BUN/Creatinine;other labs including inflammatory markers are pending -CXR is clear at this time; if her hypoxia worsens or there are other concerns, could consider CTA to r/o PE (currently with normal LE not concerning for DVT and her body habitus appears to place her at risk for OHS/OSA as a cause for hypoxia) -Will not treat with broad-spectrum antibiotics at this time -Will admit to [facility] for further evaluation, close monitoring, and treatment  -Monitor on telemetry in progressive care x at least 24 hours given hypotension -At this time, will attempt to avoid use of aerosolized medications and use HFAs instead -Will check daily labs including BMP with Mag, Phos; LFTs; CBC with differential; CRP; ferritin; fibrinogen; D-dimer  -She has not been previously treated with steroids or Remdesivir; depending on inflammatory markers, these do appear to be appropriate at this time given +COVID test and hypoxia <94% on room air -If the patient shows clinical deterioration, consider transfer to ICU with PCCM consultation -Will attempt to maintain euvolemia to a net negative fluid status - currently with AKI and appears volume depleted, likely as a result of excessive GI losses -Will ask the patient to maintain an awake prone position for 16+ hours a day, if possible, with a minimum of 2-3 hours at a time -If D-dimer <5, will use standard-dosed Lovenox for DVT prevention -GI pathogen panel is also pending, per EDP -Patient was  seen wearing full PPE including: gown, gloves, head cover, N95, and face shield; donning and doffing was in compliance with current standards.  AKI -Baseline creatinine was 1.6 in January. -During her previous hospitalization, her creatinine was 2.5 on admission and 1.33 on 8/31  -Today's creatinine is  2.42 -Likely due to prerenal failure secondary to dehydration in the setting of diarrhea and COVID-19 infection -Hold Lasix, Aldactone, Celebrex, and Cozaar for now -IVF -Follow up renal function by BMP  Diarrhea -C diff antigen positive but toxin negative -GI pathogen panel is pending -For now, possibly related to COVID infection and treatment is not indicated  Recent back surgery -Patient had complained of chronic back pain from the time of admission on 8/18 but was actually admitted for abdominal wall cellulitis -She eventually had an MRI on 8/22 that showed an "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 Saintclair Halsted evaluated the  patient on 8/23 and felt that the MRI indicated a very large disc herniation at L3-4 causing severe thecal sac compression -Surgery was performed on 8/24 and she was subsequently discharged to SNF for rehab  Chronic Diastolic Heart Failure -Mild diastolic dysfunction on [id number] echo -Holding diuretics and repleting with IVF at this time -Will monitor for evidence of volume overload, although her body habitus makes it more difficult to fully assess volume status  DM -She is reportedly on 70/30 in large doses at home, but reported inconsistent compliance -She was on 8 units BID during her last hospitalization -Will change to Lantus 10 units daily for now and follow -Last A1c was 8.9 on 8/18 -Will cover with resistant-scale SSI  Hypothyroidism -Normal thyroid testing in 10/2018 -Continue Synthroid at current dose for now  HTN -As above, hold diuretics and Cozaar -She has been fluid-responsive and marginally hypotensive thus far so no medications for now  HLD -Continue Crestor  Morbid Obesity -BMI 44.27 -Weight loss should be encouraged -Outpatient PCP/bariatric medicine/bariatric surgery f/u encouraged  Depression -Continue Amitriptyline, Wellbutrin, Cymbalta    DVT prophylaxis:  Lovenox  Code Status:  DNR - based on MOST form Family Communication: None present; I spoke with the patient's mother by telephone. Disposition Plan:  Likely back to SNF Consults called: None  Admission status: Admit - It is my clinical opinion that admission to INPATIENT is reasonable and necessary because of the expectation that this patient will require hospital care that crosses at least 2 midnights to treat this condition based on the medical complexity of the problems presented.  Given the aforementioned information, the predictability of an adverse outcome is felt to be significant.    Karmen Bongo MD [facility]   How to contact the [facility] Attending or Consulting provider [place] or  covering provider during after hours [place], for this patient?  1. Check the care team in [facility] and look for a) attending/consulting TR[facility] provider listed and b) the [facility] team listed 2. Log into [email or web address] and use [place]'s universal password to access. If you do not have the password, please contact the hospital operator. 3. Locate the [facility] provider you are looking for under [facility] and page to a number that you can be directly reached. 4. If you still have difficulty reaching the provider, please page the [facility] (Director on Call) for the Hospitalists listed on amion for assistance.   07/04/2019, 9:05 AM

## 2019-07-04 NOTE — Evaluation (Signed)
Clinical/Bedside Swallow Evaluation Patient Details  Name: Alice Kemp MRN: [id number] Date of Birth: [birth year]  Today's Date: 07/04/2019 Time: SLP Start Time (ACUTE ONLY): 1301 SLP Stop Time (ACUTE ONLY): 1329 SLP Time Calculation (min) (ACUTE ONLY): 28 min  Past Medical History:  Past Medical History:  Diagnosis Date  . Anxiety   . Arthritis   . Back pain   . Benign neoplasm of colon 06/29/2012  . Breast abscess 07/2011   left; s/p I&D + abx by gen surg  . Cervical radiculopathy at C6   . CKD (chronic kidney disease) stage 3, GFR 30-59 ml/min (HCC)    follows with renal  . Constipation   . Depression   . Diabetes mellitus type II   . Diastolic heart failure ([place]) 09/2013 echo  . Gall bladder inflammation   . Glaucoma   . Gout   . Hyperlipidemia   . Hypothyroidism   . Morbid obesity ([place])   . OBSTRUCTIVE SLEEP APNEA 12/23/2010   npsg 2012:  AHI 25/hr, severe desat to 68%.   autoset 2013: optimal pressure 13cm   . PERICARDIAL EFFUSION 12/12/2010  . Pulmonary hypertension ([place])   . Vitamin B12 deficiency   . Vitamin D deficiency    Past Surgical History:  Past Surgical History:  Procedure Laterality Date  . BREAST BIOPSY  1990   Left  . CATARACT EXTRACTION    . COLONOSCOPY  06/29/2012   Procedure: COLONOSCOPY;  Surgeon: Ladene Artist, MD,FACG;  Location: [facility];  Service: Endoscopy;  Laterality: N/A;  . LUMBAR LAMINECTOMY/DECOMPRESSION MICRODISCECTOMY Left 05/30/2019   Procedure: Left Decompressive Lumbar Laminectomy and Microdiscectomy lumbar three-four;  Surgeon: Kary Kos, MD;  Location: [facility];  Service: Neurosurgery;  Laterality: Left;  . SHOULDER SURGERY  2010   Left  . TONSILLECTOMY  1964   HPI:  Pt is a 68 yo female presenting with AMS from SNF, where she went last month after lumbar decompression surgery. She tested positive for COVID-19 (of note, pt reportedly tested positive in June; chart indicates positive test 8/18 and nmegative test 8/29). PMH:  OSA, HLD, glaucoma, HF, DM, depression, CKD, back pain, anxiety   Assessment / Plan / Recommendation Clinical Impression  Pt has a functional appearing swallow, but her mentation seems to be what may put her at risk for intermittent aspiration. A single cough was noted when pt had a tremor while holding the cup and it looked like she suddenly got a larger volume of liquid. Even this could be considered functional if she is triggering a cough. SLP provided additional assistance with feeding PRN with no further overt s/s of aspiration. She does have a tendency to talk with food in her mouth and gets easily distracted. May start with Dys 1 diet (puree) given her AMS, allowing thin liquids. SLP will f/u for potential to advance.  SLP Visit Diagnosis: Dysphagia, unspecified (R13.10)    Aspiration Risk  Mild aspiration risk    Diet Recommendation Dysphagia 1 (Puree);Thin liquid   Liquid Administration via: Cup;Straw Medication Administration: Whole meds with liquid Supervision: Staff to assist with self feeding;Full supervision/cueing for compensatory strategies Compensations: Slow rate;Berrios sips/bites Postural Changes: Seated upright at 90 degrees    Other  Recommendations Oral Care Recommendations: Oral care BID   Follow up Recommendations Skilled Nursing facility      Frequency and Duration min 2x/week  2 weeks       Prognosis Prognosis for Safe Diet Advancement: Good Barriers to Reach Goals: Cognitive deficits  Swallow Study   General HPI: Pt is a 68 yo female presenting with AMS from SNF, where she went last month after lumbar decompression surgery. She tested positive for COVID-19 (of note, pt reportedly tested positive in June; chart indicates positive test 8/18 and nmegative test 8/29). PMH: OSA, HLD, glaucoma, HF, DM, depression, CKD, back pain, anxiety Type of Study: Bedside Swallow Evaluation Previous Swallow Assessment: none in chart Diet Prior to this Study: Thin  liquids Temperature Spikes Noted: No Respiratory Status: Nasal cannula History of Recent Intubation: No Behavior/Cognition: Alert;Cooperative;Pleasant mood;Confused;Requires cueing Oral Cavity Assessment: Dry Oral Care Completed by SLP: No Oral Cavity - Dentition: Adequate natural dentition Vision: Functional for self-feeding Self-Feeding Abilities: Needs assist Patient Positioning: Upright in bed Baseline Vocal Quality: Normal    Oral/Motor/Sensory Function Overall Oral Motor/Sensory Function: (appears functional)   Ice Chips Ice chips: Not tested   Thin Liquid Thin Liquid: Within functional limits Presentation: Cup    Nectar Thick Nectar Thick Liquid: Not tested   Honey Thick Honey Thick Liquid: Not tested   Puree Puree: Within functional limits Presentation: Spoon   Solid     Solid: Not tested      Venita Sheffield Culley Hedeen 07/04/2019,1:37 PM  Pollyann Glen, M.A. [place] Acute Environmental education officer [phone] Office [phone]

## 2019-07-04 NOTE — Plan of Care (Signed)
Patient admitted to [phone]. Soft BP. LR bolus ordered with improvement. Disoriented to place and situation intermittently. Other VSS. Call light in reach.  Problem: Education: Goal: Knowledge of risk factors and measures for prevention of condition will improve Outcome: Progressing   Problem: Coping: Goal: Psychosocial and spiritual needs will be supported Outcome: Progressing   Problem: Respiratory: Goal: Will maintain a patent airway Outcome: Progressing Goal: Complications related to the disease process, condition or treatment will be avoided or minimized Outcome: Progressing

## 2019-07-04 NOTE — ED Notes (Signed)
[facility] TO INPATIENT HANDOFF REPORT  Alice Nurse Name and Phone #: Burman Nieves [phone]  S Name/Age/Gender Alice Kemp 68 y.o. female Room/Bed: 016C/016C  Code Status   Code Status: DNR  Home/SNF/Other Nursing Home Patient oriented to: self and place Is this baseline? No   Triage Complete: Triage complete  Chief Complaint AMS; hypotension   Triage Note Pt transported from [facility] for fall 07/02/19, diarrhea and AMS.  IV in place 400cc NS bolus given, pt appears to be hallucinating on arrival, nauseated, soiled depends containing exteremly malodorous dark green watery stool.  Pt did have +COVID at some point per documentation and remains on precautions at SNF.    Allergies Allergies  Allergen Reactions  . Morphine And Related Other (See Comments)    Behavioral Issues  . Sulfa Antibiotics   . Penicillins Rash    Occurred as infant    Level of Care/Admitting Diagnosis Alice Disposition    Alice Disposition Condition Comment   Admit  Hospital Area: [facility] [[id number]]  Level of Care: Progressive [102]  Covid Evaluation: Confirmed COVID Positive  Diagnosis: AMS (altered mental status) [id number]  Admitting Physician: Shela Leff [id number]  Attending Physician: Shela Leff [id number]  Estimated length of stay: past midnight tomorrow  Certification:: I certify this patient will need inpatient services for at least 2 midnights  PT Class (Do Not Modify): Inpatient [101]  PT Acc Code (Do Not Modify): Private [1]       B Medical/Surgery History Past Medical History:  Diagnosis Date  . Anxiety   . Arthritis   . Back pain   . Benign neoplasm of colon 06/29/2012  . Breast abscess 07/2011   left; s/p I&D + abx by gen surg  . Cervical radiculopathy at C6   . CKD (chronic kidney disease) stage 3, GFR 30-59 ml/min (HCC)    follows with renal  . Constipation   . Depression   . Diabetes mellitus type II   . Diastolic heart failure ([place])  09/2013 echo  . Gall bladder inflammation   . Glaucoma   . Gout   . Hyperlipidemia   . Hypothyroidism   . Morbid obesity ([place])   . OBSTRUCTIVE SLEEP APNEA 12/23/2010   npsg 2012:  AHI 25/hr, severe desat to 68%.   autoset 2013: optimal pressure 13cm   . PERICARDIAL EFFUSION 12/12/2010  . Pulmonary hypertension ([place])   . Vitamin B12 deficiency   . Vitamin D deficiency    Past Surgical History:  Procedure Laterality Date  . BREAST BIOPSY  1990   Left  . CATARACT EXTRACTION    . COLONOSCOPY  06/29/2012   Procedure: COLONOSCOPY;  Surgeon: Ladene Artist, MD,FACG;  [facility];  Service: Endoscopy;  Laterality: N/A;  . LUMBAR LAMINECTOMY/DECOMPRESSION MICRODISCECTOMY Left 05/30/2019   Procedure: Left Decompressive Lumbar Laminectomy and Microdiscectomy lumbar three-four;  Surgeon: Kary Kos, MD;  Location: [facility];  Service: Neurosurgery;  Laterality: Left;  . SHOULDER SURGERY  2010   Left  . TONSILLECTOMY  1964     A IV Location/Drains/Wounds Patient Lines/Drains/Airways Status   Active Line/Drains/Airways    Name:   Placement date:   Placement time:   Site:   Days:   Peripheral IV 07/04/19 Posterior;Right Hand   07/04/19    0457    Hand   less than 1   Peripheral IV 07/04/19 Left Forearm   07/04/19    0900    Forearm   less than 1  Wound / Incision (Open or Dehisced) 05/25/19 Other (Comment) Abdomen Right;Left excoriated area with [place] open wounds from patient picking at scabs   05/25/19    0700    Abdomen   40          Intake/Output Last 24 hours  Intake/Output Summary (Last 24 hours) at 07/04/2019 0930 Last data filed at 07/04/2019 0456 Gross per 24 hour  Intake 400 ml  Output -  Net 400 ml    Labs/Imaging Results for orders placed or performed during the hospital encounter of 07/04/19 (from the past 48 hour(s))  CBC with Differential     Status: Abnormal   Collection Time: 07/04/19  4:30 AM  Result Value Ref Range   WBC 16.8 (H) 4.0 - 10.5 K/uL   RBC  3.83 (L) 3.87 - 5.11 MIL/uL   Hemoglobin 10.9 (L) 12.0 - 15.0 g/dL   HCT 34.4 (L) 36.0 - 46.0 %   MCV 89.8 80.0 - 100.0 fL   MCH 28.5 26.0 - 34.0 pg   MCHC 31.7 30.0 - 36.0 g/dL   RDW 16.1 (H) 11.5 - 15.5 %   Platelets 469 (H) 150 - 400 K/uL   nRBC 0.2 0.0 - 0.2 %   Neutrophils Relative % 59 %   Neutro Abs 10.2 (H) 1.7 - 7.7 K/uL   Lymphocytes Relative 23 %   Lymphs Abs 3.9 0.7 - 4.0 K/uL   Monocytes Relative 12 %   Monocytes Absolute 1.9 (H) 0.1 - 1.0 K/uL   Eosinophils Relative 3 %   Eosinophils Absolute 0.4 0.0 - 0.5 K/uL   Basophils Relative 1 %   Basophils Absolute 0.1 0.0 - 0.1 K/uL   WBC Morphology INCREASED BANDS (>20% BANDS)     Comment: TOXIC GRANULATION   Immature Granulocytes 2 %   Abs Immature Granulocytes 0.27 (H) 0.00 - 0.07 K/uL    Comment: Performed at [facility], [address], [place], [place] [address]  Comprehensive metabolic panel     Status: Abnormal   Collection Time: 07/04/19  4:30 AM  Result Value Ref Range   Sodium 132 (L) 135 - 145 mmol/L   Potassium 4.2 3.5 - 5.1 mmol/L   Chloride 100 98 - 111 mmol/L   CO2 22 22 - 32 mmol/L   Glucose, Bld 164 (H) 70 - 99 mg/dL   BUN 72 (H) 8 - 23 mg/dL   Creatinine, Ser 2.42 (H) 0.44 - 1.00 mg/dL   Calcium 8.0 (L) 8.9 - 10.3 mg/dL   Total Protein 5.8 (L) 6.5 - 8.1 g/dL   Albumin 1.6 (L) 3.5 - 5.0 g/dL   AST 17 15 - 41 U/L   ALT 9 0 - 44 U/L   Alkaline Phosphatase 122 38 - 126 U/L   Total Bilirubin 0.5 0.3 - 1.2 mg/dL   GFR calc non Af Amer 20 (L) >60 mL/min   GFR calc Af Amer 23 (L) >60 mL/min   Anion gap 10 5 - 15    Comment: Performed at [facility], [place] [address] [place], [place] [address]  Ammonia     Status: Abnormal   Collection Time: 07/04/19  4:30 AM  Result Value Ref Range   Ammonia 44 (H) 9 - 35 umol/L    Comment: Performed at [facility], [place] [address] [place], [place] [address]  C Difficile Quick Screen w PCR reflex     Status: Abnormal   Collection Time: 07/04/19   4:54 AM  Specimen: Nasopharyngeal Swab; Stool  Result Value Ref Range   C Diff antigen POSITIVE (A) NEGATIVE   C Diff toxin NEGATIVE NEGATIVE   C Diff interpretation Results are indeterminate. See PCR results.     Comment: Performed at [facility], [place] [address] [place], [place] [address]  SARS Coronavirus 2 [facility] order, Performed in [facility] hospital lab) Nasopharyngeal Nasopharyngeal Swab     Status: Abnormal   Collection Time: 07/04/19  4:54 AM   Specimen: Nasopharyngeal Swab  Result Value Ref Range   SARS Coronavirus 2 POSITIVE (A) NEGATIVE    Comment: RESULT CALLED TO, READ BACK BY AND VERIFIED WITH: A. DENNIS,RN [id number] 07/04/2019 T. TYSOR (NOTE) If result is NEGATIVE SARS-CoV-2 target nucleic acids are NOT DETECTED. The SARS-CoV-2 RNA is generally detectable in upper and lower  respiratory specimens during the acute phase of infection. The lowest  concentration of SARS-CoV-2 viral copies this assay can detect is 250  copies / mL. A negative result does not preclude SARS-CoV-2 infection  and should not be used as the sole basis for treatment or other  patient management decisions.  A negative result may occur with  improper specimen collection / handling, submission of specimen other  than nasopharyngeal swab, presence of viral mutation(s) within the  areas targeted by this assay, and inadequate number of viral copies  (<250 copies / mL). A negative result must be combined with clinical  observations, patient history, and epidemiological information. If result is POSITIVE SARS-CoV-2 target nucleic acids are DETECTED.  The SARS-CoV-2 RNA is generally detectable in upper and lower  respiratory specimens during the acute phase of infection.  Positive  results are indicative of active infection with SARS-CoV-2.  Clinical  correlation with patient history and other diagnostic information is  necessary to determine patient infection status.  Positive results do  not  rule out bacterial infection or co-infection with other viruses. If result is PRESUMPTIVE POSTIVE SARS-CoV-2 nucleic acids MAY BE PRESENT.   A presumptive positive result was obtained on the submitted specimen  and confirmed on repeat testing.  While 2019 novel coronavirus  (SARS-CoV-2) nucleic acids may be present in the submitted sample  additional confirmatory testing may be necessary for epidemiological  and / or clinical management purposes  to differentiate between  SARS-CoV-2 and other Sarbecovirus currently known to infect humans.  If clinically indicated additional testing with an alternate test  methodology [phone])  is advised. The SARS-CoV-2 RNA is generally  detectable in upper and lower respiratory specimens during the acute  phase of infection. The expected result is Negative. Fact Sheet for Patients:  StrictlyIdeas.no Fact Sheet for Healthcare Providers: BankingDealers.co.za This test is not yet approved or cleared by the [place] FDA and has been authorized for detection and/or diagnosis of SARS-CoV-2 by FDA under an Emergency Use Authorization (EUA).  This EUA will remain in effect (meaning this test can be used) for the duration of the COVID-19 declaration under Section 564(b)(1) of the Act, 21 U.S.C. section 360bbb-3(b)(1), unless the authorization is terminated or revoked sooner. Performed at [facility], [place] [address] [place], [place] [address]   Lactic acid, plasma     Status: None   Collection Time: 07/04/19  5:08 AM  Result Value Ref Range   Lactic Acid, Venous 1.6 0.5 - 1.9 mmol/L    Comment: Performed at [facility] [address] [place], [place] [address]   Ct Head Wo Contrast  Result Date: 07/04/2019  CLINICAL DATA:  Mental status. Fall 2 days ago. Positive COVID-25 May 2019. EXAM: CT HEAD WITHOUT CONTRAST TECHNIQUE: Contiguous axial images were obtained from the base of the skull  through the vertex without intravenous contrast. COMPARISON:  None. FINDINGS: Brain: Ventricles, cisterns and other CSF spaces are normal. There is mild chronic ischemic microvascular disease present. There is no mass, mass effect, shift of midline structures or acute hemorrhage. No evidence of acute infarction. Vascular: No hyperdense vessel or unexpected calcification. Skull: Normal. Negative for fracture or focal lesion. Sinuses/Orbits: No acute finding. Other: None. IMPRESSION: No acute findings. Mild chronic ischemic microvascular disease. Electronically Signed   By: Marin Olp M.D.   On: 07/04/2019 07:11   Dg Chest Port 1 View  Result Date: 07/04/2019 CLINICAL DATA:  Altered mental status and hypotension EXAM: PORTABLE CHEST 1 VIEW COMPARISON:  06/03/2019 FINDINGS: Mild cardiomegaly accentuated by low volumes. Otherwise normal mediastinal contours. There is no edema, consolidation, effusion, or pneumothorax. Postoperative proximal left humerus. IMPRESSION: No evidence of acute disease. Electronically Signed   By: Monte Fantasia M.D.   On: 07/04/2019 05:26    Pending Labs Unresulted Labs (From admission, onward)    Start     Ordered   07/05/19 0500  CBC with Differential/Platelet  Daily,   R     07/04/19 0821   07/05/19 0500  Comprehensive metabolic panel  Daily,   R     07/04/19 0821   07/05/19 0500  C-reactive protein  Daily,   R     07/04/19 0821   07/05/19 0500  D-dimer, quantitative (not at [facility])  Daily,   R     07/04/19 0821   07/05/19 0500  Ferritin  Daily,   R     07/04/19 0821   07/05/19 0500  Magnesium  Daily,   R     07/04/19 0821   07/05/19 0500  Phosphorus  Daily,   R     07/04/19 0821   07/04/19 0820  C-reactive protein  Once,   STAT     07/04/19 0821   07/04/19 0820  D-dimer, quantitative (not at [facility])  Once,   STAT     07/04/19 0821   07/04/19 0820  Ferritin  Once,   STAT     07/04/19 0821   07/04/19 0820  Fibrinogen  Once,   STAT     07/04/19 0821   07/04/19  0820  Lactate dehydrogenase  Once,   STAT     07/04/19 0821   07/04/19 0820  Procalcitonin  Once,   STAT     07/04/19 0821   07/04/19 0820  Brain natriuretic peptide  Once,   STAT     07/04/19 0821   07/04/19 0418  GI pathogen panel by PCR, stool  (Gastrointestinal Panel by PCR, Stool                                                                                                                                                     *  Does Not include CLOSTRIDIUM DIFFICILE testing.**If CDIFF testing is needed, select the C Difficile Quick Screen w PCR reflex order below)  Once,   STAT     07/04/19 0419   07/04/19 0416  Blood culture (routine x 2)  BLOOD CULTURE X 2,   STAT     07/04/19 0419   07/04/19 0416  Urinalysis, Routine w reflex microscopic  Once,   STAT     07/04/19 0419   07/04/19 0416  Urine culture  ONCE - STAT,   STAT     07/04/19 0419          Vitals/Pain Today's Vitals   07/04/19 0815 07/04/19 0830 07/04/19 0852 07/04/19 0900  BP: 107/69 (!) 103/56 93/72   Pulse: 100 99 95   Resp: 18 18 13    Temp:      TempSrc:      SpO2: 95% 98% 97%   Weight:      PainSc:    0-No pain    Isolation Precautions Airborne and Contact precautions  Medications Medications  aspirin tablet 81 mg (has no administration in time range)  rosuvastatin (CRESTOR) tablet 5 mg (has no administration in time range)  amitriptyline (ELAVIL) tablet 10 mg (has no administration in time range)  buPROPion (WELLBUTRIN SR) 12 hr tablet 200 mg (has no administration in time range)  DULoxetine (CYMBALTA) DR capsule 30 mg (has no administration in time range)  levothyroxine (SYNTHROID) tablet 88 mcg (has no administration in time range)  insulin glargine (LANTUS) injection 20 Units (has no administration in time range)  pantoprazole (PROTONIX) EC tablet 40 mg (has no administration in time range)  gabapentin (NEURONTIN) capsule 300 mg (has no administration in time range)  methocarbamol (ROBAXIN) tablet  500 mg (has no administration in time range)  enoxaparin (LOVENOX) injection 40 mg (has no administration in time range)  sodium chloride flush (NS) 0.9 % injection 3 mL (has no administration in time range)  0.9 %  sodium chloride infusion (has no administration in time range)  acetaminophen (TYLENOL) tablet 650 mg (has no administration in time range)  oxyCODONE (Oxy IR/ROXICODONE) immediate release tablet 5 mg (has no administration in time range)  zolpidem (AMBIEN) tablet 5 mg (has no administration in time range)  docusate sodium (COLACE) capsule 100 mg (has no administration in time range)  polyethylene glycol (MIRALAX / GLYCOLAX) packet 17 g (has no administration in time range)  bisacodyl (DULCOLAX) EC tablet 5 mg (has no administration in time range)  ondansetron (ZOFRAN) tablet 4 mg (has no administration in time range)    Or  ondansetron (ZOFRAN) injection 4 mg (has no administration in time range)  insulin aspart (novoLOG) injection 0-15 Units (has no administration in time range)  sodium chloride flush (NS) 0.9 % injection 3 mL (has no administration in time range)  lactated ringers infusion ( Intravenous New Bag/Given 07/04/19 0851)  insulin aspart (novoLOG) injection 0-5 Units (has no administration in time range)  ondansetron (ZOFRAN) injection 4 mg (4 mg Intravenous Given 07/04/19 0511)  sodium chloride 0.9 % bolus 500 mL (0 mLs Intravenous Stopped 07/04/19 0700)    Mobility walks with device     Focused Assessments Neuro Assessment Handoff:   Cardiac Rhythm: Bundle branch block       Neuro Assessment: Exceptions to WDL Neuro Checks:      Last Documented NIHSS Modified Score:        R Recommendations: See Admitting Provider Note  Report given to:   Additional  Notes:  c-diff

## 2019-07-05 DIAGNOSIS — G9349 Other encephalopathy: Secondary | ICD-10-CM | POA: Diagnosis not present

## 2019-07-05 DIAGNOSIS — U071 COVID-19: Secondary | ICD-10-CM | POA: Diagnosis not present

## 2019-07-05 LAB — CBC WITH DIFFERENTIAL/PLATELET
Abs Immature Granulocytes: 0.1 10*3/uL — ABNORMAL HIGH (ref 0.00–0.07)
Band Neutrophils: 33 %
Basophils Absolute: 0 10*3/uL (ref 0.0–0.1)
Basophils Relative: 0 %
Eosinophils Absolute: 0 10*3/uL (ref 0.0–0.5)
Eosinophils Relative: 0 %
HCT: 31.7 % — ABNORMAL LOW (ref 36.0–46.0)
Hemoglobin: 9.6 g/dL — ABNORMAL LOW (ref 12.0–15.0)
Lymphocytes Relative: 7 %
Lymphs Abs: 0.8 10*3/uL (ref 0.7–4.0)
MCH: 27.5 pg (ref 26.0–34.0)
MCHC: 30.3 g/dL (ref 30.0–36.0)
MCV: 90.8 fL (ref 80.0–100.0)
Monocytes Absolute: 0.1 10*3/uL (ref 0.1–1.0)
Monocytes Relative: 1 %
Myelocytes: 1 %
Neutro Abs: 11 10*3/uL — ABNORMAL HIGH (ref 1.7–7.7)
Neutrophils Relative %: 58 %
Platelets: 411 10*3/uL — ABNORMAL HIGH (ref 150–400)
RBC: 3.49 MIL/uL — ABNORMAL LOW (ref 3.87–5.11)
RDW: 16.3 % — ABNORMAL HIGH (ref 11.5–15.5)
WBC Morphology: INCREASED
WBC: 12.1 10*3/uL — ABNORMAL HIGH (ref 4.0–10.5)
nRBC: 0 % (ref 0.0–0.2)

## 2019-07-05 LAB — COMPREHENSIVE METABOLIC PANEL
ALT: 11 U/L (ref 0–44)
AST: 13 U/L — ABNORMAL LOW (ref 15–41)
Albumin: 1.7 g/dL — ABNORMAL LOW (ref 3.5–5.0)
Alkaline Phosphatase: 135 U/L — ABNORMAL HIGH (ref 38–126)
Anion gap: 13 (ref 5–15)
BUN: 74 mg/dL — ABNORMAL HIGH (ref 8–23)
CO2: 20 mmol/L — ABNORMAL LOW (ref 22–32)
Calcium: 7.9 mg/dL — ABNORMAL LOW (ref 8.9–10.3)
Chloride: 99 mmol/L (ref 98–111)
Creatinine, Ser: 2.09 mg/dL — ABNORMAL HIGH (ref 0.44–1.00)
GFR calc Af Amer: 27 mL/min — ABNORMAL LOW (ref >60)
GFR calc non Af Amer: 24 mL/min — ABNORMAL LOW (ref >60)
Glucose, Bld: 240 mg/dL — ABNORMAL HIGH (ref 70–99)
Potassium: 4.3 mmol/L (ref 3.5–5.1)
Sodium: 132 mmol/L — ABNORMAL LOW (ref 135–145)
Total Bilirubin: 0.3 mg/dL (ref 0.3–1.2)
Total Protein: 5.7 g/dL — ABNORMAL LOW (ref 6.5–8.1)

## 2019-07-05 LAB — D-DIMER, QUANTITATIVE: D-Dimer, Quant: 1.5 ug/mL-FEU — ABNORMAL HIGH (ref 0.00–0.50)

## 2019-07-05 LAB — GLUCOSE, CAPILLARY: Glucose-Capillary: 182 mg/dL — ABNORMAL HIGH (ref 70–99)

## 2019-07-05 LAB — BRAIN NATRIURETIC PEPTIDE: B Natriuretic Peptide: 91.1 pg/mL (ref 0.0–100.0)

## 2019-07-05 LAB — SAMPLE TO BLOOD BANK

## 2019-07-05 LAB — MAGNESIUM: Magnesium: 1.9 mg/dL (ref 1.7–2.4)

## 2019-07-05 LAB — C-REACTIVE PROTEIN: CRP: 23.9 mg/dL — ABNORMAL HIGH (ref <1.0)

## 2019-07-05 LAB — LACTATE DEHYDROGENASE: LDH: 111 U/L (ref 98–192)

## 2019-07-05 LAB — FERRITIN: Ferritin: 150 ng/mL (ref 11–307)

## 2019-07-05 MED ORDER — LACTATED RINGERS IV SOLN
INTRAVENOUS | Status: AC
  Administered 2019-07-05: 17:00:00 via INTRAVENOUS

## 2019-07-05 NOTE — Evaluation (Signed)
Physical Therapy Evaluation Patient Details Name: Alice Kemp MRN: [id number] DOB: 23-Jul-1951 Today's Date: 07/05/2019   History of Present Illness  68 y/o female pt w/ hx of anxiety, arthritis, back pain, benign neoplasm of colon, breast abscess, cervical radiculopathy, CKD III, depression, diastolic HF, DM II, glaucoma, gout, HLD, morbid obesity, obstructive sleep apnea, pericardial effusion, pulmonary HTN, hx of COVID in 06/20.  Clinical Impression   Pt has been admitted to hospital multi times in past few months, she was admitted this time from SNF, she states she was improving with mobility to ambulation status but this morning is moving very slowly and needing mod-max a with all mobility. Pt is on room air and manages to remain in 90s throughout monitored through finger probe.    Follow Up Recommendations SNF    Equipment Recommendations  (TBD)    Recommendations for Other Services       Precautions / Restrictions Precautions Precautions: Fall Restrictions Weight Bearing Restrictions: No      Mobility  Bed Mobility Overal bed mobility: Needs Assistance Bed Mobility: Sidelying to Sit;Supine to Sit;Sit to Supine;Sit to Sidelying;Rolling Rolling: Mod assist Sidelying to sit: Max assist Supine to sit: Max assist Sit to supine: Mod assist Sit to sidelying: Mod assist General bed mobility comments: assist for all, max x 2 to scoot up in bed  Transfers Overall transfer level: Needs assistance Equipment used: None Transfers: Sit to/from Stand Sit to Stand: Total assist         General transfer comment: unable to complete at this time  Ambulation/Gait             General Gait Details: unable to ambulate at this time  Stairs            Wheelchair Mobility    Modified Rankin (Stroke Patients Only)       Balance Overall balance assessment: Needs assistance;History of Falls Sitting-balance support: Feet supported;Bilateral upper extremity  supported Sitting balance-Leahy Scale: Poor       Standing balance-Leahy Scale: Zero Standing balance comment: unable to stand at this time                             Pertinent Vitals/Pain Pain Assessment: 0-10 Pain Score: 5  Pain Location: stomach Pain Descriptors / Indicators: Aching Pain Intervention(s): Limited activity within patient's tolerance(RN has been notified)    Home Living Family/patient expects to be discharged to:: Skilled nursing facility Living Arrangements: Alone   Type of Home: House Home Access: Stairs to enter   Technical brewer of Steps: 2-3 Home Layout: One level Home Equipment: Environmental consultant - 2 wheels Additional Comments: was at SNF working in rehab when she had fall and was readmitted to hospital with COVID    Prior Function Level of Independence: (was I prior to initial hospitalization now needs A)         Comments: has been at [facility] and needing increased A to complete functional mob     Hand Dominance        Extremity/Trunk Assessment   Upper Extremity Assessment Upper Extremity Assessment: Defer to OT evaluation    Lower Extremity Assessment Lower Extremity Assessment: Generalized weakness RLE Deficits / Details: grossly 2+/5 LLE Deficits / Details: gorssly 2+/5       Communication   Communication: No difficulties  Cognition Arousal/Alertness: Awake/alert Behavior During Therapy: Anxious Overall Cognitive Status: History of cognitive impairments - at baseline Area of Impairment: Attention;Memory;Following commands  Orientation Level: Person;Place;Time Current Attention Level: Alternating   Following Commands: Follows one step commands inconsistently     Problem Solving: Slow processing;Decreased initiation;Requires verbal cues        General Comments General comments (skin integrity, edema, etc.): Pt states was starting to ambulate at SNF but slowly, today she is unable to stand  without total a, pt seems to be apprehensive or self limiting.     Exercises     Assessment/Plan    PT Assessment Patient needs continued PT services  PT Problem List Decreased strength;Decreased activity tolerance;Decreased balance;Decreased mobility;Decreased coordination;Decreased safety awareness;Obesity       PT Treatment Interventions Functional mobility training;Therapeutic activities;Therapeutic exercise;Balance training;Neuromuscular re-education;Patient/family education    PT Goals (Current goals can be found in the Care Plan section)  Acute Rehab PT Goals Time For Goal Achievement: 07/19/19 Potential to Achieve Goals: Fair    Frequency Min 2X/week   Barriers to discharge        Co-evaluation               AM-PAC PT "6 Clicks" Mobility  Outcome Measure Help needed turning from your back to your side while in a flat bed without using bedrails?: A Lot Help needed moving from lying on your back to sitting on the side of a flat bed without using bedrails?: A Lot Help needed moving to and from a bed to a chair (including a wheelchair)?: A Lot Help needed standing up from a chair using your arms (e.g., wheelchair or bedside chair)?: Total Help needed to walk in hospital room?: Total Help needed climbing 3-5 steps with a railing? : Total 6 Click Score: 9    End of Session   Activity Tolerance: Treatment limited secondary to medical complications (Comment);Patient limited by fatigue;Patient limited by lethargy Patient left: in bed;with call bell/phone within reach Nurse Communication: Mobility status PT Visit Diagnosis: Other abnormalities of gait and mobility (R26.89);History of falling (Z91.81)    Time: [id number] PT Time Calculation (min) (ACUTE ONLY): 24 min   Charges:   PT Evaluation $PT Eval Moderate Complexity: 1 Mod PT Treatments $Therapeutic Activity: 8-22 mins        Horald Chestnut, PT   Delford Field 07/05/2019, 12:56 PM

## 2019-07-05 NOTE — Progress Notes (Signed)
PROGRESS NOTE                                                                                                                                                                                                             Patient Demographics:    Alice Kemp, is a 68 y.o. female, DOB - Jan 24, 1951, [id number]  Outpatient Primary MD for the patient is [name], MD    LOS - 1  Admit date - 07/04/2019    Chief Complaint  Patient presents with  . Fall       Brief Narrative  Alice Kemp is a 68 y.o. female with medical history significant of hypothyroidism; OSA; morbid obesity (BMI 44); HLD; DM; diastolic CHF; and stage 3 CKD presenting with a fall.    She was admitted to the hospital on 05/24/2019 for a spinal stenosis status post lumbar laminectomy, she was discharged to the SNF on 824.  She also was found to have COVID-19 infection which was incidental finding during that hospitalization.  She tested negative on 06/04/2019.    Patient now was residing at [facility], she reportedly had a fall on 07/02/2019, she then came to the ER and was discharged after a stable evaluation back to SNF.  Subsequently on 07/03/2019 she developed profuse diarrhea and was brought in with severe dehydration and AKI and admitted to the hospital on 07/04/2019.   Subjective:    Alice Kemp today has, No headache, No chest pain, No abdominal pain - No Nausea, No new weakness tingling or numbness, No Cough - SOB, diarrhea improving.  Feels much better..     Assessment  & Plan :     1. COVID-19 infection during the ongoing 2020 Covid 19 Pandemic - her initial incidental positive was 05/24/2019, she was asymptomatic from this standpoint, she likely had a false negative test on 06/04/2019.  She is positive again on 07/04/2019 again incidentally while her main symptoms are C. difficile induced diarrhea.  She has no pulmonary symptoms and clinically  appears stable from COVID-19 infection standpoint.   COVID-19 Labs  Recent Labs    07/04/19 0858 07/05/19 0300  DDIMER 2.13* 1.50*  FERRITIN 156 150  LDH 132 111  CRP 26.0* 23.9*    Lab Results  Component Value Date   SARSCOV2NAA POSITIVE (A) 07/04/2019  SARSCOV2NAA NOT DETECTED 06/04/2019   SARSCOV2NAA POSITIVE (A) 05/24/2019     Hepatic Function Latest Ref Rng & Units 07/05/2019 07/04/2019 06/06/2019  Total Protein 6.5 - 8.1 g/dL 5.7(L) 5.8(L) 6.4(L)  Albumin 3.5 - 5.0 g/dL 1.7(L) 1.6(L) 1.7(L)  AST 15 - 41 U/L 13(L) 17 39  ALT 0 - 44 U/L '11 9 23  '$ Alk Phosphatase 38 - 126 U/L 135(H) 122 162(H)  Total Bilirubin 0.3 - 1.2 mg/dL 0.3 0.5 0.6  Bilirubin, Direct 0.0 - 0.3 mg/dL - - -        Component Value Date/Time   BNP 91.1 07/05/2019 0300      2.  Severe C. difficile colitis with diarrhea causing dehydration and AKI.  Placed on vancomycin, stop PPI and stool softeners, oral vancomycin and monitor.  3.  History of depression.  Continue combination of amitriptyline, Wellbutrin and Cymbalta.  4.  Morbid obesity.  BMI 44.  Follow with PCP for weight loss.  5.  Hypothyroidism.  Continue home dose Synthroid.    6.  Hypertension.  Blood pressure was low due to dehydration, hold blood pressure medications.  7.  Recent L-spine spinal stenosis surgery last month on 05/30/2019 by Dr. Saintclair Halsted.  Supportive care.   8.  DM type II.  On Lantus and sliding scale will monitor and adjust.  Poor outpatient control due to hyperglycemia with A1c of 8.9 on 05/24/2019.  CBG (last 3)  Recent Labs    07/04/19 1149 07/04/19 1623 07/04/19 2050  GLUCAP 102* 153* 252*    Lab Results  Component Value Date   HGBA1C 8.9 (H) 05/24/2019     Condition - Extremely Guarded  Family Communication  : None present  Code Status : DNR  Diet :    Diet Order            DIET - DYS 1 Room service appropriate? Yes; Fluid consistency: Thin  Diet effective now               Disposition Plan   : Inpatient, may require SNF.  Consults  : None  Procedures  :     PUD Prophylaxis :  PPI DC'd  DVT Prophylaxis  :  Lovenox    Lab Results  Component Value Date   PLT 411 (H) 07/05/2019    Inpatient Medications  Scheduled Meds: . aspirin  81 mg Oral Daily  . buPROPion  200 mg Oral Daily  . docusate sodium  100 mg Oral BID  . DULoxetine  30 mg Oral BID  . enoxaparin (LOVENOX) injection  40 mg Subcutaneous Q24H  . gabapentin  300 mg Oral TID  . insulin aspart  0-15 Units Subcutaneous TID WC  . insulin aspart  0-5 Units Subcutaneous QHS  . insulin glargine  10 Units Subcutaneous Daily  . levothyroxine  88 mcg Oral QAC breakfast  . pantoprazole  40 mg Oral QHS  . rosuvastatin  5 mg Oral Daily  . vancomycin  500 mg Oral Q6H   Continuous Infusions: . sodium chloride    . lactated ringers     PRN Meds:.sodium chloride, acetaminophen, bisacodyl, [DISCONTINUED] ondansetron **OR** ondansetron (ZOFRAN) IV, polyethylene glycol, sodium chloride flush, zolpidem  Antibiotics  :    Anti-infectives (From admission, onward)   Start     Dose/Rate Route Frequency Ordered Stop   07/05/19 1600  remdesivir 100 mg in sodium chloride 0.9 % 250 mL IVPB  Status:  Discontinued     100 mg 500 mL/hr over  30 Minutes Intravenous Every 24 hours 07/04/19 1517 07/05/19 0734   07/04/19 1600  remdesivir 200 mg in sodium chloride 0.9 % 250 mL IVPB     200 mg 500 mL/hr over 30 Minutes Intravenous Once 07/04/19 1517 07/04/19 1941   07/04/19 1400  vancomycin (VANCOCIN) 50 mg/mL oral solution 500 mg     500 mg Oral Every 6 hours 07/04/19 1134         Time Spent in minutes  30   Lala Lund M.D on 07/05/2019 at 11:42 AM  To page go to [email or web address] - password [facility]  [facility] -  Office  [phone]  See all Orders from today for further details    Objective:   Vitals:   07/04/19 1621 07/05/19 0000 07/05/19 0856 07/05/19 0900  BP: (!) 103/46  (!) 97/35 (!) 111/58  Pulse: 85  93  91  Resp: 20  (!) 21   Temp: 98.3 F (36.8 C) 98.1 F (36.7 C) 97.6 F (36.4 C)   TempSrc: Oral Oral Oral   SpO2: 96%  95% 96%  Weight:      Height:        Wt Readings from Last 3 Encounters:  07/04/19 117 kg  06/05/19 117.8 kg  12/14/18 112.5 kg     Intake/Output Summary (Last 24 hours) at 07/05/2019 1142 Last data filed at 07/05/2019 1041 Gross per 24 hour  Intake 2474.27 ml  Output 500 ml  Net 1974.27 ml     Physical Exam  Awake Alert,  No new F.N deficits, Normal affect [place].AT,PERRAL Supple Neck,No JVD, No cervical lymphadenopathy appriciated.  Symmetrical Chest wall movement, Good air movement bilaterally, CTAB RRR,No Gallops,Rubs or new Murmurs, No Parasternal Heave +ve B.Sounds, Abd Soft, No tenderness, No organomegaly appriciated, No rebound - guarding or rigidity. No Cyanosis, Clubbing or edema, No new Rash or bruise      Data Review:    CBC Recent Labs  Lab 07/04/19 0430 07/05/19 0300  WBC 16.8* 12.1*  HGB 10.9* 9.6*  HCT 34.4* 31.7*  PLT 469* 411*  MCV 89.8 90.8  MCH 28.5 27.5  MCHC 31.7 30.3  RDW 16.1* 16.3*  LYMPHSABS 3.9 0.8  MONOABS 1.9* 0.1  EOSABS 0.4 0.0  BASOSABS 0.1 0.0    Chemistries  Recent Labs  Lab 07/04/19 0430 07/05/19 0300  NA 132* 132*  K 4.2 4.3  CL 100 99  CO2 22 20*  GLUCOSE 164* 240*  BUN 72* 74*  CREATININE 2.42* 2.09*  CALCIUM 8.0* 7.9*  MG  --  1.9  AST 17 13*  ALT 9 11  ALKPHOS 122 135*  BILITOT 0.5 0.3   ------------------------------------------------------------------------------------------------------------------ No results for input(s): CHOL, HDL, LDLCALC, TRIG, CHOLHDL, LDLDIRECT in the last 72 hours.  Lab Results  Component Value Date   HGBA1C 8.9 (H) 05/24/2019   ------------------------------------------------------------------------------------------------------------------ No results for input(s): TSH, T4TOTAL, T3FREE, THYROIDAB in the last 72 hours.  Invalid input(s): FREET3   Cardiac Enzymes No results for input(s): CKMB, TROPONINI, MYOGLOBIN in the last 168 hours.  Invalid input(s): CK ------------------------------------------------------------------------------------------------------------------    Component Value Date/Time   BNP 91.1 07/05/2019 0300    Micro Results Recent Results (from the past 240 hour(s))  C Difficile Quick Screen w PCR reflex     Status: Abnormal   Collection Time: 07/04/19  4:54 AM   Specimen: Nasopharyngeal Swab; Stool  Result Value Ref Range Status   C Diff antigen POSITIVE (A) NEGATIVE Final   C Diff toxin NEGATIVE NEGATIVE Final  C Diff interpretation Results are indeterminate. See PCR results.  Final    Comment: Performed at [facility], [place] [address], [place], [place] [address]  SARS Coronavirus 2 [facility] order, Performed in [facility] hospital lab) Nasopharyngeal Nasopharyngeal Swab     Status: Abnormal   Collection Time: 07/04/19  4:54 AM   Specimen: Nasopharyngeal Swab  Result Value Ref Range Status   SARS Coronavirus 2 POSITIVE (A) NEGATIVE Final    Comment: RESULT CALLED TO, READ BACK BY AND VERIFIED WITH: A. DENNIS,RN [id number] 07/04/2019 T. TYSOR (NOTE) If result is NEGATIVE SARS-CoV-2 target nucleic acids are NOT DETECTED. The SARS-CoV-2 RNA is generally detectable in upper and lower  respiratory specimens during the acute phase of infection. The lowest  concentration of SARS-CoV-2 viral copies this assay can detect is 250  copies / mL. A negative result does not preclude SARS-CoV-2 infection  and should not be used as the sole basis for treatment or other  patient management decisions.  A negative result may occur with  improper specimen collection / handling, submission of specimen other  than nasopharyngeal swab, presence of viral mutation(s) within the  areas targeted by this assay, and inadequate number of viral copies  (<250 copies / mL). A negative result must be combined with clinical   observations, patient history, and epidemiological information. If result is POSITIVE SARS-CoV-2 target nucleic acids are DETECTED.  The SARS-CoV-2 RNA is generally detectable in upper and lower  respiratory specimens during the acute phase of infection.  Positive  results are indicative of active infection with SARS-CoV-2.  Clinical  correlation with patient history and other diagnostic information is  necessary to determine patient infection status.  Positive results do  not rule out bacterial infection or co-infection with other viruses. If result is PRESUMPTIVE POSTIVE SARS-CoV-2 nucleic acids MAY BE PRESENT.   A presumptive positive result was obtained on the submitted specimen  and confirmed on repeat testing.  While 2019 novel coronavirus  (SARS-CoV-2) nucleic acids may be present in the submitted sample  additional confirmatory testing may be necessary for epidemiological  and / or clinical management purposes  to differentiate between  SARS-CoV-2 and other Sarbecovirus currently known to infect humans.  If clinically indicated additional testing with an alternate test  methodology [phone])  is advised. The SARS-CoV-2 RNA is generally  detectable in upper and lower respiratory specimens during the acute  phase of infection. The expected result is Negative. Fact Sheet for Patients:  StrictlyIdeas.no Fact Sheet for Healthcare Providers: BankingDealers.co.za This test is not yet approved or cleared by the [place] FDA and has been authorized for detection and/or diagnosis of SARS-CoV-2 by FDA under an Emergency Use Authorization (EUA).  This EUA will remain in effect (meaning this test can be used) for the duration of the COVID-19 declaration under Section 564(b)(1) of the Act, 21 U.S.C. section 360bbb-3(b)(1), unless the authorization is terminated or revoked sooner. Performed at [facility], [place] [address]  [place], [place] [address]   C. Diff by PCR, Reflexed     Status: Abnormal   Collection Time: 07/04/19  4:54 AM  Result Value Ref Range Status   Toxigenic C. Difficile by PCR POSITIVE (A) NEGATIVE Final    Comment: Positive for toxigenic C. difficile with little to no toxin production. Only treat if clinical presentation suggests symptomatic illness. Performed at [facility], [place] [address], [place], [place] [address]   Blood culture (routine x 2)  Status: None (Preliminary result)   Collection Time: 07/04/19  5:20 AM   Specimen: BLOOD  Result Value Ref Range Status   Specimen Description BLOOD LEFT ANTECUBITAL  Final   Special Requests   Final    BOTTLES DRAWN AEROBIC AND ANAEROBIC Blood Culture results may not be optimal due to an excessive volume of blood received in culture bottles   Culture   Final    NO GROWTH 1 DAY Performed at [place] [address], [place], [place] [address]    Report Status PENDING  Incomplete  Blood culture (routine x 2)     Status: None (Preliminary result)   Collection Time: 07/04/19  5:24 AM   Specimen: BLOOD LEFT HAND  Result Value Ref Range Status   Specimen Description BLOOD LEFT HAND  Final   Special Requests   Final    BOTTLES DRAWN AEROBIC ONLY Blood Culture adequate volume   Culture   Final    NO GROWTH 1 DAY Performed at [facility], [place] [address] [place], [place] [address]    Report Status PENDING  Incomplete    Radiology Reports Ct Head Wo Contrast  Result Date: 07/04/2019 CLINICAL DATA:  Mental status. Fall 2 days ago. Positive COVID-25 May 2019. EXAM: CT HEAD WITHOUT CONTRAST TECHNIQUE: Contiguous axial images were obtained from the base of the skull through the vertex without intravenous contrast. COMPARISON:  None. FINDINGS: Brain: Ventricles, cisterns and other CSF spaces are normal. There is mild chronic ischemic microvascular disease present. There is no mass, mass effect, shift of midline structures  or acute hemorrhage. No evidence of acute infarction. Vascular: No hyperdense vessel or unexpected calcification. Skull: Normal. Negative for fracture or focal lesion. Sinuses/Orbits: No acute finding. Other: None. IMPRESSION: No acute findings. Mild chronic ischemic microvascular disease. Electronically Signed   By: Marin Olp M.D.   On: 07/04/2019 07:11   Dg Chest Port 1 View  Result Date: 07/04/2019 CLINICAL DATA:  Altered mental status and hypotension EXAM: PORTABLE CHEST 1 VIEW COMPARISON:  06/03/2019 FINDINGS: Mild cardiomegaly accentuated by low volumes. Otherwise normal mediastinal contours. There is no edema, consolidation, effusion, or pneumothorax. Postoperative proximal left humerus. IMPRESSION: No evidence of acute disease. Electronically Signed   By: Monte Fantasia M.D.   On: 07/04/2019 05:26

## 2019-07-05 NOTE — TOC Initial Note (Signed)
Transition of Care [facility]) - Initial/Assessment Note    Patient Details  Name: Alice Kemp MRN: [id number] Date of Birth: 1951-08-08  Transition of Care [facility]) CM/SW Contact:    Benard Halsted, LCSW Phone Number: 07/05/2019, 4:07 PM  Clinical Narrative:                 CSW received consult for possible SNF placement at time of discharge. CSW spoke with patient's mother regarding PT recommendation of SNF placement at time of discharge. Patient has been at [facility] and [facility]. CSW has been made aware from [facility] that patient may not receive insurance approval again for rehab and for the hospital to discuss long term planning with the family. Patient's mother states that she is very hard of hearing and is not "good with this stuff" and for CSW to please contact patient's brother, Alice Kemp [phone]). CSW spoke with [place]. He reports that he only recently got involved with his sister's plans and he last spoke with the social worker at [facility] a few days ago to discuss Medicaid application. He states patient or family would be unable to pay privately for return to [place] and patient lives alone with no support. Patient's brother states he is wiling to assist with the Medicaid application but he is not clear on patient's financial situation. CSW will follow up with financial counseling. CSW to continue to follow and assist with discharge planning needs.    Expected Discharge Plan: Skilled Nursing Facility Barriers to Discharge: Continued Medical Work up, Ship broker   Patient Goals and CMS Choice Patient states their goals for this hospitalization and ongoing recovery are:: Rehab and get stronger CMS Medicare.gov Compare Post Acute Care list provided to:: Patient Represenative (must comment)(mother) Choice offered to / list presented to : Parent  Expected Discharge Plan and Services Expected Discharge Plan: [place] In-house Referral: Clinical Social  Work Discharge Planning Services: NA Post Acute Care Choice: [place] Living arrangements for the past 2 months: [place] Expected Discharge Date: 07/07/19               DME Arranged: N/A DME Agency: NA       HH Arranged: NA [place] Agency: NA        Prior Living Arrangements/Services Living arrangements for the past 2 months: [place] Lives with:: Self Patient language and need for interpreter reviewed:: Yes Do you feel safe going back to the place where you live?: Yes      Need for Family Participation in Patient Care: Yes (Comment) Care giver support system in place?: No (comment)   Criminal Activity/Legal Involvement Pertinent to Current Situation/Hospitalization: No - Comment as needed  Activities of Daily Living Home Assistive Devices/Equipment: None ADL Screening (condition at time of admission) Patient's cognitive ability adequate to safely complete daily activities?: Yes Is the patient deaf or have difficulty hearing?: No Does the patient have difficulty seeing, even when wearing glasses/contacts?: No Does the patient have difficulty concentrating, remembering, or making decisions?: No Patient able to express need for assistance with ADLs?: Yes Does the patient have difficulty dressing or bathing?: No Independently performs ADLs?: Yes (appropriate for developmental age) Does the patient have difficulty walking or climbing stairs?: Yes Weakness of Legs: Both Weakness of Arms/Hands: None  Permission Sought/Granted Permission sought to share information with : Facility Sport and exercise psychologist, Family Supports Permission granted to share information with : No  Share Information with NAME: Alice Kemp  Permission granted to share  info w AGENCY: [place] granted to share info w Relationship: Mother  Permission granted to share info w Contact Information: [phone]  Emotional Assessment Appearance:: Appears stated  age Attitude/Demeanor/Rapport: Unable to Assess Affect (typically observed): Unable to Assess Orientation: : Oriented to Self Alcohol / Substance Use: Not Applicable Psych Involvement: No (comment)  Admission diagnosis:  AKI (acute kidney injury) ([place]) [N17.9] Altered mental status, unspecified altered mental status type [R41.82] COVID-19 [U07.1, J98.8] AMS (altered mental status) [R41.82] Patient Active Problem List   Diagnosis Date Noted  . AMS (altered mental status) 07/04/2019  . Encephalopathy due to COVID-19 virus 07/04/2019  . HNP (herniated nucleus pulposus), lumbar 05/30/2019  . Cellulitis of abdominal wall 05/24/2019  . AKI (acute kidney injury) ([place]) 05/24/2019  . Hyponatremia 05/24/2019  . Anemia 05/24/2019  . Suspected Covid-19 Virus Infection 03/28/2019  . Essential hypertension 10/15/2017  . Pain in both lower extremities 01/27/2017  . Lumbar radiculopathy 06/02/2016  . Leg weakness, bilateral 06/02/2016  . Gout 11/29/2015  . Palpitations   . Chronic diastolic heart failure ([place]) 09/23/2013  . CKD (chronic kidney disease), stage III ([place])   . Depression 02/11/2013  . Benign neoplasm of colon 06/29/2012  . Cervical radiculopathy at C6 07/24/2011  . Morbid obesity ([place]) 02/14/2011  . Hypothyroidism 02/14/2011  . OBSTRUCTIVE SLEEP APNEA 12/23/2010  . PERICARDIAL EFFUSION 12/12/2010  . Uncontrolled type 2 diabetes mellitus with stage 3 chronic kidney disease ([place]) 12/02/2010  . EDEMA 12/02/2010  . DYSPNEA 12/02/2010   PCP:  [name], MD Pharmacy:  No Pharmacies Listed    Social Determinants of Health (SDOH) Interventions    Readmission Risk Interventions Readmission Risk Prevention Plan 07/05/2019  Transportation Screening Complete  PCP or Specialist Appt within 3-5 Days Complete  HRI or [place] Complete  Social Work Consult for [place] Planning/Counseling Complete  Palliative Care Screening Not Applicable  Medication Review Designer, fashion/clothing) Complete  Some recent data might be hidden

## 2019-07-05 NOTE — Progress Notes (Signed)
  Speech Language Pathology Treatment: Dysphagia  Patient Details Name: Alice Kemp MRN: [id number] DOB: [birth year] Today's Date: 07/05/2019 Time: [id number] SLP Time Calculation (min) (ACUTE ONLY): 17 min  Assessment / Plan / Recommendation Clinical Impression  Pt remains confused but is more attentive to self-feeding, therefore more solid textures were offered today. She has mildly prolonged mastication and has mild oral residuals with solids. SLP provided a liquid wash clear. Pt is hesitant to sit upright, referencing her recent back surgery. Immediate cough was noted when she drank thin liquids in a sem-reclined position. SLP elevated the [facility] further without additional s/s of aspiration. Her mentation and her positioning would likely be what puts her at most risk for aspiration at this time. I think she can advance her solids, but would still keep them softened. Order added for mechanical soft foods with thin liquids. Pt is slow to eat but at minimum would benefit from set-up assist and close to full supervision as I suspect she would try to eat lying down.   HPI HPI: Pt is a 68 yo female presenting with AMS from SNF, where she went last month after lumbar decompression surgery. She tested positive for COVID-19 (of note, pt reportedly tested positive in June; chart indicates positive test 8/18 and nmegative test 8/29). PMH: OSA, HLD, glaucoma, HF, DM, depression, CKD, back pain, anxiety      SLP Plan  Continue with current plan of care       Recommendations  Diet recommendations: Dysphagia 3 (mechanical soft);Thin liquid Liquids provided via: Cup;Straw Medication Administration: Whole meds with liquid Supervision: Patient able to self feed;Full supervision/cueing for compensatory strategies Compensations: Slow rate;[place] sips/bites Postural Changes and/or Swallow Maneuvers: Seated upright 90 degrees;Upright 30-60 min after meal                Oral Care Recommendations: Oral care  BID Follow up Recommendations: Skilled Nursing facility SLP Visit Diagnosis: Dysphagia, unspecified (R13.10) Plan: Continue with current plan of care       GO                Venita Sheffield Alix Lahmann 07/05/2019, 1:34 PM  Pollyann Glen, M.A. [place] Acute Environmental education officer [phone] Office [phone]

## 2019-07-06 DIAGNOSIS — U071 COVID-19: Secondary | ICD-10-CM | POA: Diagnosis not present

## 2019-07-06 DIAGNOSIS — G9349 Other encephalopathy: Secondary | ICD-10-CM | POA: Diagnosis not present

## 2019-07-06 LAB — CBC WITH DIFFERENTIAL/PLATELET
Abs Immature Granulocytes: 0.23 10*3/uL — ABNORMAL HIGH (ref 0.00–0.07)
Basophils Absolute: 0.1 10*3/uL (ref 0.0–0.1)
Basophils Relative: 1 %
Eosinophils Absolute: 0 10*3/uL (ref 0.0–0.5)
Eosinophils Relative: 0 %
HCT: 30.8 % — ABNORMAL LOW (ref 36.0–46.0)
Hemoglobin: 9.4 g/dL — ABNORMAL LOW (ref 12.0–15.0)
Immature Granulocytes: 2 %
Lymphocytes Relative: 24 %
Lymphs Abs: 3.1 10*3/uL (ref 0.7–4.0)
MCH: 27.4 pg (ref 26.0–34.0)
MCHC: 30.5 g/dL (ref 30.0–36.0)
MCV: 89.8 fL (ref 80.0–100.0)
Monocytes Absolute: 1.5 10*3/uL — ABNORMAL HIGH (ref 0.1–1.0)
Monocytes Relative: 12 %
Neutro Abs: 7.9 10*3/uL — ABNORMAL HIGH (ref 1.7–7.7)
Neutrophils Relative %: 61 %
Platelets: 464 10*3/uL — ABNORMAL HIGH (ref 150–400)
RBC: 3.43 MIL/uL — ABNORMAL LOW (ref 3.87–5.11)
RDW: 16.5 % — ABNORMAL HIGH (ref 11.5–15.5)
WBC Morphology: INCREASED
WBC: 12.7 10*3/uL — ABNORMAL HIGH (ref 4.0–10.5)
nRBC: 0 % (ref 0.0–0.2)

## 2019-07-06 LAB — COMPREHENSIVE METABOLIC PANEL
ALT: 13 U/L (ref 0–44)
AST: 15 U/L (ref 15–41)
Albumin: 1.8 g/dL — ABNORMAL LOW (ref 3.5–5.0)
Alkaline Phosphatase: 129 U/L — ABNORMAL HIGH (ref 38–126)
Anion gap: 10 (ref 5–15)
BUN: 87 mg/dL — ABNORMAL HIGH (ref 8–23)
CO2: 22 mmol/L (ref 22–32)
Calcium: 7.7 mg/dL — ABNORMAL LOW (ref 8.9–10.3)
Chloride: 99 mmol/L (ref 98–111)
Creatinine, Ser: 1.93 mg/dL — ABNORMAL HIGH (ref 0.44–1.00)
GFR calc Af Amer: 30 mL/min — ABNORMAL LOW (ref >60)
GFR calc non Af Amer: 26 mL/min — ABNORMAL LOW (ref >60)
Glucose, Bld: 158 mg/dL — ABNORMAL HIGH (ref 70–99)
Potassium: 4 mmol/L (ref 3.5–5.1)
Sodium: 131 mmol/L — ABNORMAL LOW (ref 135–145)
Total Bilirubin: 0.2 mg/dL — ABNORMAL LOW (ref 0.3–1.2)
Total Protein: 5.6 g/dL — ABNORMAL LOW (ref 6.5–8.1)

## 2019-07-06 LAB — GLUCOSE, CAPILLARY
Glucose-Capillary: 294 mg/dL — ABNORMAL HIGH (ref 70–99)
Glucose-Capillary: 276 mg/dL — ABNORMAL HIGH (ref 70–99)
Glucose-Capillary: 220 mg/dL — ABNORMAL HIGH (ref 70–99)
Glucose-Capillary: 192 mg/dL — ABNORMAL HIGH (ref 70–99)
Glucose-Capillary: 81 mg/dL (ref 70–99)
Glucose-Capillary: 139 mg/dL — ABNORMAL HIGH (ref 70–99)
Glucose-Capillary: 103 mg/dL — ABNORMAL HIGH (ref 70–99)

## 2019-07-06 LAB — LACTATE DEHYDROGENASE: LDH: 107 U/L (ref 98–192)

## 2019-07-06 LAB — HEMOGLOBIN AND HEMATOCRIT, BLOOD
HCT: 32 % — ABNORMAL LOW (ref 36.0–46.0)
HCT: 31.3 % — ABNORMAL LOW (ref 36.0–46.0)
Hemoglobin: 9.8 g/dL — ABNORMAL LOW (ref 12.0–15.0)
Hemoglobin: 9.7 g/dL — ABNORMAL LOW (ref 12.0–15.0)

## 2019-07-06 LAB — MAGNESIUM: Magnesium: 1.9 mg/dL (ref 1.7–2.4)

## 2019-07-06 LAB — ABO/RH: ABO/RH(D): A POS

## 2019-07-06 LAB — BRAIN NATRIURETIC PEPTIDE: B Natriuretic Peptide: 94.7 pg/mL (ref 0.0–100.0)

## 2019-07-06 LAB — C-REACTIVE PROTEIN: CRP: 13.6 mg/dL — ABNORMAL HIGH (ref <1.0)

## 2019-07-06 LAB — FERRITIN: Ferritin: 115 ng/mL (ref 11–307)

## 2019-07-06 LAB — D-DIMER, QUANTITATIVE: D-Dimer, Quant: 1.69 ug/mL-FEU — ABNORMAL HIGH (ref 0.00–0.50)

## 2019-07-06 MED ORDER — LACTATED RINGERS IV SOLN
INTRAVENOUS | Status: AC
  Administered 2019-07-06: 09:00:00 via INTRAVENOUS

## 2019-07-06 NOTE — NC FL2 (Signed)
[place] LEVEL OF CARE SCREENING TOOL     IDENTIFICATION  Patient Name: Alice Kemp Birthdate: Dec 31, 1950 Sex: female Admission Date (Current Location): 07/04/2019  [facility] and [place] Number:  Herbalist and Address:  ([facility])      Provider Number: [id number]  Attending Physician Name and Address:  Thurnell Lose, MD  Relative Name and Phone Number:  Twania Simmering [phone]), brother    Current Level of Care: Hospital Recommended Level of Care: [place] Prior Approval Number:    Date Approved/Denied:   PASRR Number: pending, under manual review  Discharge Plan: SNF    Current Diagnoses: Patient Active Problem List   Diagnosis Date Noted  . AMS (altered mental status) 07/04/2019  . Encephalopathy due to COVID-19 virus 07/04/2019  . HNP (herniated nucleus pulposus), lumbar 05/30/2019  . Cellulitis of abdominal wall 05/24/2019  . AKI (acute kidney injury) ([place]) 05/24/2019  . Hyponatremia 05/24/2019  . Anemia 05/24/2019  . Suspected Covid-19 Virus Infection 03/28/2019  . Essential hypertension 10/15/2017  . Pain in both lower extremities 01/27/2017  . Lumbar radiculopathy 06/02/2016  . Leg weakness, bilateral 06/02/2016  . Gout 11/29/2015  . Palpitations   . Chronic diastolic heart failure ([place]) 09/23/2013  . CKD (chronic kidney disease), stage III ([place])   . Depression 02/11/2013  . Benign neoplasm of colon 06/29/2012  . Cervical radiculopathy at C6 07/24/2011  . Morbid obesity ([place]) 02/14/2011  . Hypothyroidism 02/14/2011  . OBSTRUCTIVE SLEEP APNEA 12/23/2010  . PERICARDIAL EFFUSION 12/12/2010  . Uncontrolled type 2 diabetes mellitus with stage 3 chronic kidney disease ([place]) 12/02/2010  . EDEMA 12/02/2010  . DYSPNEA 12/02/2010    Orientation RESPIRATION BLADDER Height & Weight     Self  Normal Incontinent, External catheter Weight: 257 lb 15 oz (117 kg) Height:  5' 4.02" (162.6 cm)   BEHAVIORAL SYMPTOMS/MOOD NEUROLOGICAL BOWEL NUTRITION STATUS      Incontinent Diet(Please see DC Summary)  AMBULATORY STATUS COMMUNICATION OF NEEDS Skin   Extensive Assist Verbally Other (Comment)(wound on abdomen)                       Personal Care Assistance Level of Assistance  Bathing, Feeding, Dressing Bathing Assistance: Maximum assistance Feeding assistance: Limited assistance Dressing Assistance: Maximum assistance     Functional Limitations Info  Sight, Hearing, Speech Sight Info: Adequate Hearing Info: Adequate Speech Info: Adequate    SPECIAL CARE FACTORS FREQUENCY  PT (By licensed PT), OT (By licensed OT)     PT Frequency: 5x OT Frequency: 5x            Contractures Contractures Info: Not present    Additional Factors Info  Code Status, Allergies, Isolation Precautions, Insulin Sliding Scale, Psychotropic Code Status Info: DNR Allergies Info: Morphine And Related, Sulfa Antibiotics, Penicillins Psychotropic Info: Wellbutrin; Cymbalta Insulin Sliding Scale Info: See DC Summary for dose Isolation Precautions Info: COVID positive     Current Medications (07/06/2019):  This is the current hospital active medication list Current Facility-Administered Medications  Medication Dose Route Frequency Provider Last Rate Last Dose  . 0.9 %  sodium chloride infusion  250 mL Intravenous PRN Karmen Bongo, MD      . acetaminophen (TYLENOL) tablet 650 mg  650 mg Oral Q6H PRN Karmen Bongo, MD   650 mg at 07/05/19 1728  . aspirin chewable tablet 81 mg  81 mg Oral Daily Karmen Bongo, MD   81 mg at  07/05/19 1015  . buPROPion (WELLBUTRIN SR) 12 hr tablet 200 mg  200 mg Oral Daily Karmen Bongo, MD   200 mg at 07/05/19 1016  . DULoxetine (CYMBALTA) DR capsule 30 mg  30 mg Oral BID Karmen Bongo, MD   30 mg at 07/05/19 2109  . enoxaparin (LOVENOX) injection 40 mg  40 mg Subcutaneous Q24H Karmen Bongo, MD   40 mg at 07/05/19 1245  . gabapentin (NEURONTIN)  capsule 300 mg  300 mg Oral TID Karmen Bongo, MD   300 mg at 07/05/19 2110  . insulin aspart (novoLOG) injection 0-15 Units  0-15 Units Subcutaneous TID WC Karmen Bongo, MD   3 Units at 07/06/19 [phone]  . insulin aspart (novoLOG) injection 0-5 Units  0-5 Units Subcutaneous QHS Karmen Bongo, MD   5 Units at 07/04/19 2127  . insulin glargine (LANTUS) injection 10 Units  10 Units Subcutaneous Daily Karmen Bongo, MD   10 Units at 07/05/19 1018  . lactated ringers infusion   Intravenous Continuous Thurnell Lose, MD 125 mL/hr at 07/06/19 [phone]    . levothyroxine (SYNTHROID) tablet 88 mcg  88 mcg Oral QAC breakfast Karmen Bongo, MD   88 mcg at 07/05/19 0512  . ondansetron (ZOFRAN) injection 4 mg  4 mg Intravenous Q6H PRN Karmen Bongo, MD      . rosuvastatin (CRESTOR) tablet 5 mg  5 mg Oral Daily Karmen Bongo, MD   5 mg at 07/05/19 1016  . sodium chloride flush (NS) 0.9 % injection 3 mL  3 mL Intravenous PRN Karmen Bongo, MD      . vancomycin (VANCOCIN) 50 mg/mL oral solution 500 mg  500 mg Oral Q6H Thurnell Lose, MD   500 mg at 07/05/19 2328  . zolpidem (AMBIEN) tablet 5 mg  5 mg Oral QHS PRN Karmen Bongo, MD         Discharge Medications: Please see discharge summary for a list of discharge medications.  Relevant Imaging Results:  Relevant Lab Results:   Additional Information #SS [id number]  Benard Halsted, LCSW

## 2019-07-06 NOTE — Progress Notes (Signed)
  Speech Language Pathology Treatment: Dysphagia  Patient Details Name: Alice Kemp MRN: [id number] DOB: 09-17-1951 Today's Date: 07/06/2019 Time: [id number] SLP Time Calculation (min) (ACUTE ONLY): 19 min  Assessment / Plan / Recommendation Clinical Impression  Pt is more confused today and requiring hand-over-hand support for self-feeding, which she did not need during previous SLP visits. Even after chopping her food more finely and offering [name] bites at a time, pt has significant difficult with oral manipulation. She has food hanging from her lips without awareness. She appears to be have quite prolonged mastication, but when she opens her mouth, the food remains sitting on her tongue and looks mostly untouched. Suspect that she is going through the motions of chewing but not using her tongue to move the bolus so that it is actually thorough prepared. Additional amounts of puree and liquid washes are not effective. Ultimately, pt expectorated some of the food and SLP manually removed the rest. She is no longer appropriate for a mechanical soft diet. Will downgrade her to purees, but recommend careful assistance during meals. RN at lunch but NT notified of findings. SLP will continue to follow.   HPI HPI: Pt is a 68 yo female presenting with AMS from SNF, where she went last month after lumbar decompression surgery. She tested positive for COVID-19 (of note, pt reportedly tested positive in June; chart indicates positive test 8/18 and nmegative test 8/29). PMH: OSA, HLD, glaucoma, HF, DM, depression, CKD, back pain, anxiety      SLP Plan  Continue with current plan of care       Recommendations  Diet recommendations: Dysphagia 1 (puree);Thin liquid Liquids provided via: Cup;Straw Medication Administration: Whole meds with puree Supervision: Staff to assist with self feeding;Full supervision/cueing for compensatory strategies Compensations: Slow rate;[name] sips/bites Postural Changes  and/or Swallow Maneuvers: Seated upright 90 degrees;Upright 30-60 min after meal                Oral Care Recommendations: Oral care BID Follow up Recommendations: Skilled Nursing facility SLP Visit Diagnosis: Dysphagia, unspecified (R13.10) Plan: Continue with current plan of care       GO                Alice Kemp 07/06/2019, 3:17 PM  Pollyann Glen, M.A. [place] Acute Environmental education officer [phone] Office [phone]

## 2019-07-06 NOTE — Progress Notes (Signed)
Administered 584ml LR bolus per Dr. Candiss Norse. Dr Candiss Norse stated he will place order in epic

## 2019-07-06 NOTE — Progress Notes (Signed)
PROGRESS NOTE                                                                                                                                                                                                             Patient Demographics:    Alice Kemp, is a 68 y.o. female, DOB - Jan 04, 1951, MOQ:[id number]  Outpatient Primary MD for the patient is [name], MD    LOS - 2  Admit date - 07/04/2019    Chief Complaint  Patient presents with  . Fall       Brief Narrative  Alice Kemp is a 68 y.o. female with medical history significant of hypothyroidism; OSA; morbid obesity (BMI 44); HLD; DM; diastolic CHF; and stage 3 CKD presenting with a fall.    She was admitted to the hospital on 05/24/2019 for a spinal stenosis status post lumbar laminectomy, she was discharged to the SNF on 824.  She also was found to have COVID-19 infection which was incidental finding during that hospitalization.  She tested negative on 06/04/2019.    Patient now was residing at [facility], she reportedly had a fall on 07/02/2019, she then came to the ER and was discharged after a stable evaluation back to SNF.  Subsequently on 07/03/2019 she developed profuse diarrhea and was brought in with severe dehydration and AKI and admitted to the hospital on 07/04/2019.   Subjective:   Patient in bed appears tired but in no distress, denies any headache chest or abdominal pain.  She states she has no shortness of breath.  Diarrhea is better.   Assessment  & Plan :     1. COVID-19 infection during the ongoing 2020 Covid 19 Pandemic - her initial incidental positive was 05/24/2019, she was asymptomatic from this standpoint, she likely had a false negative test on 06/04/2019.  She is positive again on 07/04/2019 again incidentally while her main symptoms are C. difficile induced diarrhea.  She has no pulmonary symptoms and clinically appears stable from  COVID-19 infection standpoint.   COVID-19 Labs  Recent Labs    07/04/19 0858 07/05/19 0300 07/06/19 0440  DDIMER 2.13* 1.50* 1.69*  FERRITIN 156 150 115  LDH 132 111 107  CRP 26.0* 23.9* 13.6*    Lab Results  Component Value Date   SARSCOV2NAA POSITIVE (A) 07/04/2019  SARSCOV2NAA NOT DETECTED 06/04/2019   SARSCOV2NAA POSITIVE (A) 05/24/2019     Hepatic Function Latest Ref Rng & Units 07/06/2019 07/05/2019 07/04/2019  Total Protein 6.5 - 8.1 g/dL 5.6(L) 5.7(L) 5.8(L)  Albumin 3.5 - 5.0 g/dL 1.8(L) 1.7(L) 1.6(L)  AST 15 - 41 U/L 15 13(L) 17  ALT 0 - 44 U/L '13 11 9  '$ Alk Phosphatase 38 - 126 U/L 129(H) 135(H) 122  Total Bilirubin 0.3 - 1.2 mg/dL 0.2(L) 0.3 0.5  Bilirubin, Direct 0.0 - 0.3 mg/dL - - -        Component Value Date/Time   BNP 94.7 07/06/2019 0440      2.  Severe C. difficile colitis with diarrhea causing dehydration and AKI.  Placed on vancomycin, stopped PPI and stool softeners, oral vancomycin and monitor.  Repeat IV fluid bolus and aggressive IV fluids on 07/06/2019 as she is still clinically quite dehydrated.  3.  History of depression.  Continue combination of amitriptyline, Wellbutrin and Cymbalta.  4.  Morbid obesity.  BMI 44.  Follow with PCP for weight loss.  5.  Hypothyroidism.  Continue home dose Synthroid.    6.  Hypertension.  Blood pressure was low due to dehydration, hold blood pressure medications.  7.  Recent L-spine spinal stenosis surgery last month on 05/30/2019 by Dr. Saintclair Halsted.  Supportive care.   8.  DM type II.  On Lantus and sliding scale will monitor and adjust.  Poor outpatient control due to hyperglycemia with A1c of 8.9 on 05/24/2019.  CBG (last 3)  Recent Labs    07/04/19 2050 07/05/19 2027 07/06/19 0823  GLUCAP 252* 182* 192*    Lab Results  Component Value Date   HGBA1C 8.9 (H) 05/24/2019     Condition - Extremely Guarded  Family Communication  : None present  Code Status : DNR  Diet :    Diet Order             DIET DYS 3 Room service appropriate? Yes; Fluid consistency: Thin  Diet effective now               Disposition Plan  : Inpatient, may require SNF.  Consults  : None  Procedures  :     PUD Prophylaxis :  PPI DC'd  DVT Prophylaxis  :  Lovenox    Lab Results  Component Value Date   PLT 464 (H) 07/06/2019    Inpatient Medications  Scheduled Meds: . aspirin  81 mg Oral Daily  . buPROPion  200 mg Oral Daily  . DULoxetine  30 mg Oral BID  . enoxaparin (LOVENOX) injection  40 mg Subcutaneous Q24H  . gabapentin  300 mg Oral TID  . insulin aspart  0-15 Units Subcutaneous TID WC  . insulin aspart  0-5 Units Subcutaneous QHS  . insulin glargine  10 Units Subcutaneous Daily  . levothyroxine  88 mcg Oral QAC breakfast  . rosuvastatin  5 mg Oral Daily  . vancomycin  500 mg Oral Q6H   Continuous Infusions: . sodium chloride    . lactated ringers 125 mL/hr at 07/06/19 0843  . lactated ringers     PRN Meds:.sodium chloride, acetaminophen, [DISCONTINUED] ondansetron **OR** ondansetron (ZOFRAN) IV, sodium chloride flush, zolpidem  Antibiotics  :    Anti-infectives (From admission, onward)   Start     Dose/Rate Route Frequency Ordered Stop   07/05/19 1600  remdesivir 100 mg in sodium chloride 0.9 % 250 mL IVPB  Status:  Discontinued  100 mg 500 mL/hr over 30 Minutes Intravenous Every 24 hours 07/04/19 1517 07/05/19 0734   07/04/19 1600  remdesivir 200 mg in sodium chloride 0.9 % 250 mL IVPB     200 mg 500 mL/hr over 30 Minutes Intravenous Once 07/04/19 1517 07/04/19 1941   07/04/19 1400  vancomycin (VANCOCIN) 50 mg/mL oral solution 500 mg     500 mg Oral Every 6 hours 07/04/19 1134         Time Spent in minutes  30   Lala Lund M.D on 07/06/2019 at 10:02 AM  To page go to [email or web address] - password [facility]  [facility] -  Office  [phone]  See all Orders from today for further details    Objective:   Vitals:   07/05/19 0900 07/05/19 1800 07/05/19  2033 07/06/19 0825  BP: (!) 111/58 (!) 99/50  99/64  Pulse: 91 97  96  Resp:  19  14  Temp:  98.5 F (36.9 C) 98.1 F (36.7 C) 97.9 F (36.6 C)  TempSrc:  Oral Oral Oral  SpO2: 96% 94%  90%  Weight:      Height:        Wt Readings from Last 3 Encounters:  07/04/19 117 kg  06/05/19 117.8 kg  12/14/18 112.5 kg     Intake/Output Summary (Last 24 hours) at 07/06/2019 1002 Last data filed at 07/06/2019 0300 Gross per 24 hour  Intake 1830.49 ml  Output 800 ml  Net 1030.49 ml     Physical Exam  Awake but appears mildly confused and dehydrated, in no distress, no focal deficits  [place].AT,PERRAL Supple Neck,No JVD, No cervical lymphadenopathy appriciated.  Symmetrical Chest wall movement, Good air movement bilaterally, CTAB RRR,No Gallops, Rubs or new Murmurs, No Parasternal Heave +ve B.Sounds, Abd Soft, No tenderness, No organomegaly appriciated, No rebound - guarding or rigidity. No Cyanosis, Clubbing or edema, No new Rash or bruise     Data Review:    CBC Recent Labs  Lab 07/04/19 0430 07/05/19 0300 07/06/19 0440  WBC 16.8* 12.1* 12.7*  HGB 10.9* 9.6* 9.4*  HCT 34.4* 31.7* 30.8*  PLT 469* 411* 464*  MCV 89.8 90.8 89.8  MCH 28.5 27.5 27.4  MCHC 31.7 30.3 30.5  RDW 16.1* 16.3* 16.5*  LYMPHSABS 3.9 0.8 3.1  MONOABS 1.9* 0.1 1.5*  EOSABS 0.4 0.0 0.0  BASOSABS 0.1 0.0 0.1    Chemistries  Recent Labs  Lab 07/04/19 0430 07/05/19 0300 07/06/19 0440  NA 132* 132* 131*  K 4.2 4.3 4.0  CL 100 99 99  CO2 22 20* 22  GLUCOSE 164* 240* 158*  BUN 72* 74* 87*  CREATININE 2.42* 2.09* 1.93*  CALCIUM 8.0* 7.9* 7.7*  MG  --  1.9 1.9  AST 17 13* 15  ALT '9 11 13  '$ ALKPHOS 122 135* 129*  BILITOT 0.5 0.3 0.2*   ------------------------------------------------------------------------------------------------------------------ No results for input(s): CHOL, HDL, LDLCALC, TRIG, CHOLHDL, LDLDIRECT in the last 72 hours.  Lab Results  Component Value Date   HGBA1C 8.9  (H) 05/24/2019   ------------------------------------------------------------------------------------------------------------------ No results for input(s): TSH, T4TOTAL, T3FREE, THYROIDAB in the last 72 hours.  Invalid input(s): FREET3  Cardiac Enzymes No results for input(s): CKMB, TROPONINI, MYOGLOBIN in the last 168 hours.  Invalid input(s): CK ------------------------------------------------------------------------------------------------------------------    Component Value Date/Time   BNP 94.7 07/06/2019 0440    Micro Results Recent Results (from the past 240 hour(s))  C Difficile Quick Screen w PCR reflex     Status: Abnormal  Collection Time: 07/04/19  4:54 AM   Specimen: Nasopharyngeal Swab; Stool  Result Value Ref Range Status   C Diff antigen POSITIVE (A) NEGATIVE Final   C Diff toxin NEGATIVE NEGATIVE Final   C Diff interpretation Results are indeterminate. See PCR results.  Final    Comment: Performed at [facility], [place] [address], [place], [place] [address]  SARS Coronavirus 2 [facility] order, Performed in [facility] hospital lab) Nasopharyngeal Nasopharyngeal Swab     Status: Abnormal   Collection Time: 07/04/19  4:54 AM   Specimen: Nasopharyngeal Swab  Result Value Ref Range Status   SARS Coronavirus 2 POSITIVE (A) NEGATIVE Final    Comment: RESULT CALLED TO, READ BACK BY AND VERIFIED WITH: A. DENNIS,RN [id number] 07/04/2019 T. TYSOR (NOTE) If result is NEGATIVE SARS-CoV-2 target nucleic acids are NOT DETECTED. The SARS-CoV-2 RNA is generally detectable in upper and lower  respiratory specimens during the acute phase of infection. The lowest  concentration of SARS-CoV-2 viral copies this assay can detect is 250  copies / mL. A negative result does not preclude SARS-CoV-2 infection  and should not be used as the sole basis for treatment or other  patient management decisions.  A negative result may occur with  improper specimen collection / handling,  submission of specimen other  than nasopharyngeal swab, presence of viral mutation(s) within the  areas targeted by this assay, and inadequate number of viral copies  (<250 copies / mL). A negative result must be combined with clinical  observations, patient history, and epidemiological information. If result is POSITIVE SARS-CoV-2 target nucleic acids are DETECTED.  The SARS-CoV-2 RNA is generally detectable in upper and lower  respiratory specimens during the acute phase of infection.  Positive  results are indicative of active infection with SARS-CoV-2.  Clinical  correlation with patient history and other diagnostic information is  necessary to determine patient infection status.  Positive results do  not rule out bacterial infection or co-infection with other viruses. If result is PRESUMPTIVE POSTIVE SARS-CoV-2 nucleic acids MAY BE PRESENT.   A presumptive positive result was obtained on the submitted specimen  and confirmed on repeat testing.  While 2019 novel coronavirus  (SARS-CoV-2) nucleic acids may be present in the submitted sample  additional confirmatory testing may be necessary for epidemiological  and / or clinical management purposes  to differentiate between  SARS-CoV-2 and other Sarbecovirus currently known to infect humans.  If clinically indicated additional testing with an alternate test  methodology [phone])  is advised. The SARS-CoV-2 RNA is generally  detectable in upper and lower respiratory specimens during the acute  phase of infection. The expected result is Negative. Fact Sheet for Patients:  StrictlyIdeas.no Fact Sheet for Healthcare Providers: BankingDealers.co.za This test is not yet approved or cleared by the [place] FDA and has been authorized for detection and/or diagnosis of SARS-CoV-2 by FDA under an Emergency Use Authorization (EUA).  This EUA will remain in effect (meaning this test can be  used) for the duration of the COVID-19 declaration under Section 564(b)(1) of the Act, 21 U.S.C. section 360bbb-3(b)(1), unless the authorization is terminated or revoked sooner. Performed at [facility], [place] [address] [place], [place] [address]   C. Diff by PCR, Reflexed     Status: Abnormal   Collection Time: 07/04/19  4:54 AM  Result Value Ref Range Status   Toxigenic C. Difficile by PCR POSITIVE (A) NEGATIVE Final    Comment: Positive for toxigenic C.  difficile with little to no toxin production. Only treat if clinical presentation suggests symptomatic illness. Performed at [facility], [place] [address], [place], [place] [address]   Blood culture (routine x 2)     Status: None (Preliminary result)   Collection Time: 07/04/19  5:20 AM   Specimen: BLOOD  Result Value Ref Range Status   Specimen Description BLOOD LEFT ANTECUBITAL  Final   Special Requests   Final    BOTTLES DRAWN AEROBIC AND ANAEROBIC Blood Culture results may not be optimal due to an excessive volume of blood received in culture bottles   Culture   Final    NO GROWTH 2 DAYS Performed at [facility], [place] [address], [place], [place] [address]    Report Status PENDING  Incomplete  Blood culture (routine x 2)     Status: None (Preliminary result)   Collection Time: 07/04/19  5:24 AM   Specimen: BLOOD LEFT HAND  Result Value Ref Range Status   Specimen Description BLOOD LEFT HAND  Final   Special Requests   Final    BOTTLES DRAWN AEROBIC ONLY Blood Culture adequate volume   Culture   Final    NO GROWTH 2 DAYS Performed at [facility], [place] [address] [place], [place] [address]    Report Status PENDING  Incomplete    Radiology Reports Ct Head Wo Contrast  Result Date: 07/04/2019 CLINICAL DATA:  Mental status. Fall 2 days ago. Positive COVID-25 May 2019. EXAM: CT HEAD WITHOUT CONTRAST TECHNIQUE: Contiguous axial images were obtained from the base of the skull through the vertex  without intravenous contrast. COMPARISON:  None. FINDINGS: Brain: Ventricles, cisterns and other CSF spaces are normal. There is mild chronic ischemic microvascular disease present. There is no mass, mass effect, shift of midline structures or acute hemorrhage. No evidence of acute infarction. Vascular: No hyperdense vessel or unexpected calcification. Skull: Normal. Negative for fracture or focal lesion. Sinuses/Orbits: No acute finding. Other: None. IMPRESSION: No acute findings. Mild chronic ischemic microvascular disease. Electronically Signed   By: Marin Olp M.D.   On: 07/04/2019 07:11   Dg Chest Port 1 View  Result Date: 07/04/2019 CLINICAL DATA:  Altered mental status and hypotension EXAM: PORTABLE CHEST 1 VIEW COMPARISON:  06/03/2019 FINDINGS: Mild cardiomegaly accentuated by low volumes. Otherwise normal mediastinal contours. There is no edema, consolidation, effusion, or pneumothorax. Postoperative proximal left humerus. IMPRESSION: No evidence of acute disease. Electronically Signed   By: Monte Fantasia M.D.   On: 07/04/2019 05:26

## 2019-07-06 NOTE — Progress Notes (Addendum)
   07/06/19 1527  Stool Characteristics  Bowel Incontinence Yes  Stool Type Type 7 (Liquid consistency with no solid pieces)  Has the patient had three Type 7 stools in the last 24 hours? Yes (Currently on enteric precautions)  Stool Descriptors Brown (dime sized blood clots in stool )  Stool Amount Large  Stool Source Rectum  Notified MD Candiss Norse at 1544 see new orders

## 2019-07-07 DIAGNOSIS — U071 COVID-19: Secondary | ICD-10-CM | POA: Diagnosis not present

## 2019-07-07 DIAGNOSIS — G9349 Other encephalopathy: Secondary | ICD-10-CM | POA: Diagnosis not present

## 2019-07-07 LAB — CBC WITH DIFFERENTIAL/PLATELET
Abs Immature Granulocytes: 0.21 10*3/uL — ABNORMAL HIGH (ref 0.00–0.07)
Basophils Absolute: 0.1 10*3/uL (ref 0.0–0.1)
Basophils Relative: 1 %
Eosinophils Absolute: 0.2 10*3/uL (ref 0.0–0.5)
Eosinophils Relative: 1 %
HCT: 31.2 % — ABNORMAL LOW (ref 36.0–46.0)
Hemoglobin: 9.6 g/dL — ABNORMAL LOW (ref 12.0–15.0)
Immature Granulocytes: 2 %
Lymphocytes Relative: 22 %
Lymphs Abs: 3.1 10*3/uL (ref 0.7–4.0)
MCH: 27.8 pg (ref 26.0–34.0)
MCHC: 30.8 g/dL (ref 30.0–36.0)
MCV: 90.4 fL (ref 80.0–100.0)
Monocytes Absolute: 1.3 10*3/uL — ABNORMAL HIGH (ref 0.1–1.0)
Monocytes Relative: 9 %
Neutro Abs: 9.1 10*3/uL — ABNORMAL HIGH (ref 1.7–7.7)
Neutrophils Relative %: 65 %
Platelets: 444 10*3/uL — ABNORMAL HIGH (ref 150–400)
RBC: 3.45 MIL/uL — ABNORMAL LOW (ref 3.87–5.11)
RDW: 16.6 % — ABNORMAL HIGH (ref 11.5–15.5)
WBC: 13.9 10*3/uL — ABNORMAL HIGH (ref 4.0–10.5)
nRBC: 0.1 % (ref 0.0–0.2)

## 2019-07-07 LAB — GLUCOSE, CAPILLARY
Glucose-Capillary: 102 mg/dL — ABNORMAL HIGH (ref 70–99)
Glucose-Capillary: 158 mg/dL — ABNORMAL HIGH (ref 70–99)
Glucose-Capillary: 166 mg/dL — ABNORMAL HIGH (ref 70–99)
Glucose-Capillary: 152 mg/dL — ABNORMAL HIGH (ref 70–99)

## 2019-07-07 LAB — D-DIMER, QUANTITATIVE: D-Dimer, Quant: 2.4 ug/mL-FEU — ABNORMAL HIGH (ref 0.00–0.50)

## 2019-07-07 LAB — COMPREHENSIVE METABOLIC PANEL
ALT: 12 U/L (ref 0–44)
AST: 19 U/L (ref 15–41)
Albumin: 1.6 g/dL — ABNORMAL LOW (ref 3.5–5.0)
Alkaline Phosphatase: 142 U/L — ABNORMAL HIGH (ref 38–126)
Anion gap: 8 (ref 5–15)
BUN: 81 mg/dL — ABNORMAL HIGH (ref 8–23)
CO2: 23 mmol/L (ref 22–32)
Calcium: 7.9 mg/dL — ABNORMAL LOW (ref 8.9–10.3)
Chloride: 103 mmol/L (ref 98–111)
Creatinine, Ser: 1.8 mg/dL — ABNORMAL HIGH (ref 0.44–1.00)
GFR calc Af Amer: 33 mL/min — ABNORMAL LOW (ref >60)
GFR calc non Af Amer: 28 mL/min — ABNORMAL LOW (ref >60)
Glucose, Bld: 97 mg/dL (ref 70–99)
Potassium: 4 mmol/L (ref 3.5–5.1)
Sodium: 134 mmol/L — ABNORMAL LOW (ref 135–145)
Total Bilirubin: 0.2 mg/dL — ABNORMAL LOW (ref 0.3–1.2)
Total Protein: 5.5 g/dL — ABNORMAL LOW (ref 6.5–8.1)

## 2019-07-07 LAB — MAGNESIUM: Magnesium: 1.8 mg/dL (ref 1.7–2.4)

## 2019-07-07 LAB — FERRITIN: Ferritin: 109 ng/mL (ref 11–307)

## 2019-07-07 LAB — TSH: TSH: 1.011 u[IU]/mL (ref 0.350–4.500)

## 2019-07-07 LAB — BRAIN NATRIURETIC PEPTIDE: B Natriuretic Peptide: 76.3 pg/mL (ref 0.0–100.0)

## 2019-07-07 LAB — C-REACTIVE PROTEIN: CRP: 18.7 mg/dL — ABNORMAL HIGH (ref <1.0)

## 2019-07-07 LAB — LACTATE DEHYDROGENASE: LDH: 125 U/L (ref 98–192)

## 2019-07-07 LAB — MRSA PCR SCREENING: MRSA by PCR: NEGATIVE

## 2019-07-07 MED ORDER — LACTATED RINGERS IV SOLN
INTRAVENOUS | Status: AC
  Administered 2019-07-07: 10:00:00 via INTRAVENOUS

## 2019-07-07 MED ORDER — LACTATED RINGERS IV BOLUS
1000.0000 mL | Freq: Once | INTRAVENOUS | Status: AC
  Administered 2019-07-07: 1000 mL via INTRAVENOUS

## 2019-07-07 MED ORDER — PRO-STAT SUGAR FREE PO LIQD
30.0000 mL | Freq: Three times a day (TID) | ORAL | Status: DC
  Administered 2019-07-07 – 2019-08-01 (×48): 30 mL via ORAL
  Filled 2019-07-07 (×51): qty 30

## 2019-07-07 NOTE — Progress Notes (Addendum)
PROGRESS NOTE                                                                                                                                                                                                             Patient Demographics:    Nikhita Mentzel, is a 68 y.o. female, DOB - 1951-03-13, [id number]  Outpatient Primary MD for the patient is [name], MD    LOS - 3  Admit date - 07/04/2019    Chief Complaint  Patient presents with   Fall       Brief Narrative  AERIKA GROLL is a 68 y.o. female with medical history significant of hypothyroidism; OSA; morbid obesity (BMI 44); HLD; DM; diastolic CHF; and stage 3 CKD presenting with a fall.    She was admitted to the hospital on 05/24/2019 for a spinal stenosis status post lumbar laminectomy, she was discharged to the SNF on 824.  She also was found to have COVID-19 infection which was incidental finding during that hospitalization.  She tested negative on 06/04/2019.    Patient now was residing at [facility], she reportedly had a fall on 07/02/2019, she then came to the ER and was discharged after a stable evaluation back to SNF.  Subsequently on 07/03/2019 she developed profuse diarrhea and was brought in with severe dehydration and AKI and admitted to the hospital on 07/04/2019.   Subjective:   Patient in bed, appears comfortable, denies any headache, no fever, no chest pain or pressure, no shortness of breath , no abdominal pain. No focal weakness.   Assessment  & Plan :     1. COVID-19 infection during the ongoing 2020 Covid 19 Pandemic - her initial incidental positive was 05/24/2019, she was asymptomatic from this standpoint, she likely had a false negative test on 06/04/2019.  She is positive again on 07/04/2019 again incidentally while her main symptoms are C. difficile induced diarrhea.  She has no pulmonary symptoms and clinically appears stable from  COVID-19 infection standpoint.   COVID-19 Labs  Recent Labs    07/05/19 0300 07/06/19 0440 07/07/19 0318  DDIMER 1.50* 1.69* 2.40*  FERRITIN 150 115 109  LDH 111 107 125  CRP 23.9* 13.6* 18.7*    Lab Results  Component Value Date   SARSCOV2NAA POSITIVE (A) 07/04/2019   SARSCOV2NAA NOT  DETECTED 06/04/2019   SARSCOV2NAA POSITIVE (A) 05/24/2019     Hepatic Function Latest Ref Rng & Units 07/07/2019 07/06/2019 07/05/2019  Total Protein 6.5 - 8.1 g/dL 5.5(L) 5.6(L) 5.7(L)  Albumin 3.5 - 5.0 g/dL 1.6(L) 1.8(L) 1.7(L)  AST 15 - 41 U/L 19 15 13(L)  ALT 0 - 44 U/L '12 13 11  '$ Alk Phosphatase 38 - 126 U/L 142(H) 129(H) 135(H)  Total Bilirubin 0.3 - 1.2 mg/dL 0.2(L) 0.2(L) 0.3  Bilirubin, Direct 0.0 - 0.3 mg/dL - - -        Component Value Date/Time   BNP 76.3 07/07/2019 0318      2.  Severe C. difficile colitis with diarrhea causing dehydration and AKI.  Placed on vancomycin, stopped PPI and stool softeners, oral vancomycin and monitor.  Despite 48 hours of high-dose vancomycin she continues to have profuse diarrhea and getting dehydrated, will add fidaxomicin on 07/07/2019.  3.  History of depression.  Continue combination of amitriptyline, Wellbutrin and Cymbalta.  4.  Morbid obesity.  BMI 44.  Follow with PCP for weight loss.  5.  Hypothyroidism.  Continue home dose Synthroid.    6.  Hypertension.  Blood pressure was low due to dehydration, hold blood pressure medications.  Continue IV fluids.  7.  Recent L-spine spinal stenosis surgery last month on 05/30/2019 by Dr. Saintclair Halsted.  Supportive care.   8.  Dysphagia.  Speech following currently on dysphagia 1 diet, oral intake is poor added pro-stat as well.    9. DM type II.  On Lantus and sliding scale will monitor and adjust.  Poor outpatient control due to hyperglycemia with A1c of 8.9 on 05/24/2019.  CBG (last 3)  Recent Labs    07/06/19 1628 07/06/19 2236 07/07/19 0808  GLUCAP 139* 103* 102*    Lab Results  Component  Value Date   HGBA1C 8.9 (H) 05/24/2019     Condition - Extremely Guarded  Family Communication  : None present  Code Status : DNR  Diet :    Diet Order            DIET - DYS 1 Room service appropriate? Yes; Fluid consistency: Thin  Diet effective now               Disposition Plan  : Inpatient, may require SNF.  Consults  : None  Procedures  :     PUD Prophylaxis :  PPI DC'd  DVT Prophylaxis  :  Lovenox    Lab Results  Component Value Date   PLT 444 (H) 07/07/2019    Inpatient Medications  Scheduled Meds:  aspirin  81 mg Oral Daily   buPROPion  200 mg Oral Daily   DULoxetine  30 mg Oral BID   enoxaparin (LOVENOX) injection  40 mg Subcutaneous Q24H   gabapentin  300 mg Oral TID   insulin aspart  0-15 Units Subcutaneous TID WC   insulin aspart  0-5 Units Subcutaneous QHS   insulin glargine  10 Units Subcutaneous Daily   levothyroxine  88 mcg Oral QAC breakfast   rosuvastatin  5 mg Oral Daily   vancomycin  500 mg Oral Q6H   Continuous Infusions:  sodium chloride     lactated ringers     PRN Meds:.sodium chloride, acetaminophen, [DISCONTINUED] ondansetron **OR** ondansetron (ZOFRAN) IV, sodium chloride flush  Antibiotics  :    Anti-infectives (From admission, onward)   Start     Dose/Rate Route Frequency Ordered Stop   07/05/19 1600  remdesivir  100 mg in sodium chloride 0.9 % 250 mL IVPB  Status:  Discontinued     100 mg 500 mL/hr over 30 Minutes Intravenous Every 24 hours 07/04/19 1517 07/05/19 0734   07/04/19 1600  remdesivir 200 mg in sodium chloride 0.9 % 250 mL IVPB     200 mg 500 mL/hr over 30 Minutes Intravenous Once 07/04/19 1517 07/04/19 1941   07/04/19 1400  vancomycin (VANCOCIN) 50 mg/mL oral solution 500 mg     500 mg Oral Every 6 hours 07/04/19 1134         Time Spent in minutes  30   Lala Lund M.D on 07/07/2019 at 9:59 AM  To page go to [email or web address] - password [email or web address]  [facility] -  Office   [phone]  See all Orders from today for further details    Objective:   Vitals:   07/07/19 0000 07/07/19 0100 07/07/19 0400 07/07/19 0811  BP: 100/60  106/66 (!) 87/52  Pulse: 100 100 (!) 103 100  Resp: '16  20 18  '$ Temp: 98.8 F (37.1 C)  98.6 F (37 C) 98.4 F (36.9 C)  TempSrc: Oral  Oral Oral  SpO2: 95% 98% 92% 98%  Weight:      Height:        Wt Readings from Last 3 Encounters:  07/04/19 117 kg  06/05/19 117.8 kg  12/14/18 112.5 kg     Intake/Output Summary (Last 24 hours) at 07/07/2019 0959 Last data filed at 07/06/2019 2000 Gross per 24 hour  Intake 1167.28 ml  Output 900 ml  Net 267.28 ml     Physical Exam  Awake mildly confused but able to answer basic questions and follow basic commands, appears dehydrated,  [place].AT,PERRAL Supple Neck,No JVD, No cervical lymphadenopathy appriciated.  Symmetrical Chest wall movement, Good air movement bilaterally, CTAB RRR,No Gallops, Rubs or new Murmurs, No Parasternal Heave +ve B.Sounds, Abd Soft, No tenderness, No organomegaly appriciated, No rebound - guarding or rigidity. No Cyanosis, Clubbing or edema, No new Rash or bruise    Data Review:    CBC Recent Labs  Lab 07/04/19 0430 07/05/19 0300 07/06/19 0440 07/06/19 1622 07/06/19 2120 07/07/19 0318  WBC 16.8* 12.1* 12.7*  --   --  13.9*  HGB 10.9* 9.6* 9.4* 9.8* 9.7* 9.6*  HCT 34.4* 31.7* 30.8* 32.0* 31.3* 31.2*  PLT 469* 411* 464*  --   --  444*  MCV 89.8 90.8 89.8  --   --  90.4  MCH 28.5 27.5 27.4  --   --  27.8  MCHC 31.7 30.3 30.5  --   --  30.8  RDW 16.1* 16.3* 16.5*  --   --  16.6*  LYMPHSABS 3.9 0.8 3.1  --   --  3.1  MONOABS 1.9* 0.1 1.5*  --   --  1.3*  EOSABS 0.4 0.0 0.0  --   --  0.2  BASOSABS 0.1 0.0 0.1  --   --  0.1    Chemistries  Recent Labs  Lab 07/04/19 0430 07/05/19 0300 07/06/19 0440 07/07/19 0318  NA 132* 132* 131* 134*  K 4.2 4.3 4.0 4.0  CL 100 99 99 103  CO2 22 20* 22 23  GLUCOSE 164* 240* 158* 97  BUN 72* 74*  87* 81*  CREATININE 2.42* 2.09* 1.93* 1.80*  CALCIUM 8.0* 7.9* 7.7* 7.9*  MG  --  1.9 1.9 1.8  AST 17 13* 15 19  ALT '9 11 13 12  '$ ALKPHOS 122 135* 129*  142*  BILITOT 0.5 0.3 0.2* 0.2*   ------------------------------------------------------------------------------------------------------------------ No results for input(s): CHOL, HDL, LDLCALC, TRIG, CHOLHDL, LDLDIRECT in the last 72 hours.  Lab Results  Component Value Date   HGBA1C 8.9 (H) 05/24/2019   ------------------------------------------------------------------------------------------------------------------ Recent Labs    07/07/19 0318  TSH 1.011    Cardiac Enzymes No results for input(s): CKMB, TROPONINI, MYOGLOBIN in the last 168 hours.  Invalid input(s): CK ------------------------------------------------------------------------------------------------------------------    Component Value Date/Time   BNP 76.3 07/07/2019 0318    Micro Results Recent Results (from the past 240 hour(s))  C Difficile Quick Screen w PCR reflex     Status: Abnormal   Collection Time: 07/04/19  4:54 AM   Specimen: Nasopharyngeal Swab; Stool  Result Value Ref Range Status   C Diff antigen POSITIVE (A) NEGATIVE Final   C Diff toxin NEGATIVE NEGATIVE Final   C Diff interpretation Results are indeterminate. See PCR results.  Final    Comment: Performed at [facility], [place] [address], [place], [place] [address]  SARS Coronavirus 2 [facility] order, Performed in [facility] hospital lab) Nasopharyngeal Nasopharyngeal Swab     Status: Abnormal   Collection Time: 07/04/19  4:54 AM   Specimen: Nasopharyngeal Swab  Result Value Ref Range Status   SARS Coronavirus 2 POSITIVE (A) NEGATIVE Final    Comment: RESULT CALLED TO, READ BACK BY AND VERIFIED WITH: A. DENNIS,RN [id number] 07/04/2019 T. TYSOR (NOTE) If result is NEGATIVE SARS-CoV-2 target nucleic acids are NOT DETECTED. The SARS-CoV-2 RNA is generally detectable in upper and lower   respiratory specimens during the acute phase of infection. The lowest  concentration of SARS-CoV-2 viral copies this assay can detect is 250  copies / mL. A negative result does not preclude SARS-CoV-2 infection  and should not be used as the sole basis for treatment or other  patient management decisions.  A negative result may occur with  improper specimen collection / handling, submission of specimen other  than nasopharyngeal swab, presence of viral mutation(s) within the  areas targeted by this assay, and inadequate number of viral copies  (<250 copies / mL). A negative result must be combined with clinical  observations, patient history, and epidemiological information. If result is POSITIVE SARS-CoV-2 target nucleic acids are DETECTED.  The SARS-CoV-2 RNA is generally detectable in upper and lower  respiratory specimens during the acute phase of infection.  Positive  results are indicative of active infection with SARS-CoV-2.  Clinical  correlation with patient history and other diagnostic information is  necessary to determine patient infection status.  Positive results do  not rule out bacterial infection or co-infection with other viruses. If result is PRESUMPTIVE POSTIVE SARS-CoV-2 nucleic acids MAY BE PRESENT.   A presumptive positive result was obtained on the submitted specimen  and confirmed on repeat testing.  While 2019 novel coronavirus  (SARS-CoV-2) nucleic acids may be present in the submitted sample  additional confirmatory testing may be necessary for epidemiological  and / or clinical management purposes  to differentiate between  SARS-CoV-2 and other Sarbecovirus currently known to infect humans.  If clinically indicated additional testing with an alternate test  methodology [phone])  is advised. The SARS-CoV-2 RNA is generally  detectable in upper and lower respiratory specimens during the acute  phase of infection. The expected result is Negative. Fact  Sheet for Patients:  StrictlyIdeas.no Fact Sheet for Healthcare Providers: BankingDealers.co.za This test is not yet approved or cleared by the [place] FDA and has  been authorized for detection and/or diagnosis of SARS-CoV-2 by FDA under an Emergency Use Authorization (EUA).  This EUA will remain in effect (meaning this test can be used) for the duration of the COVID-19 declaration under Section 564(b)(1) of the Act, 21 U.S.C. section 360bbb-3(b)(1), unless the authorization is terminated or revoked sooner. Performed at [facility], [place] [address], [name], [place] [address]   C. Diff by PCR, Reflexed     Status: Abnormal   Collection Time: 07/04/19  4:54 AM  Result Value Ref Range Status   Toxigenic C. Difficile by PCR POSITIVE (A) NEGATIVE Final    Comment: Positive for toxigenic C. difficile with little to no toxin production. Only treat if clinical presentation suggests symptomatic illness. Performed at [facility], [place] [address] [place], [place] [address]   Blood culture (routine x 2)     Status: None (Preliminary result)   Collection Time: 07/04/19  5:20 AM   Specimen: BLOOD  Result Value Ref Range Status   Specimen Description BLOOD LEFT ANTECUBITAL  Final   Special Requests   Final    BOTTLES DRAWN AEROBIC AND ANAEROBIC Blood Culture results may not be optimal due to an excessive volume of blood received in culture bottles   Culture   Final    NO GROWTH 3 DAYS Performed at [facility], [place] [address] [place], [place] [address]    Report Status PENDING  Incomplete  Blood culture (routine x 2)     Status: None (Preliminary result)   Collection Time: 07/04/19  5:24 AM   Specimen: BLOOD LEFT HAND  Result Value Ref Range Status   Specimen Description BLOOD LEFT HAND  Final   Special Requests   Final    BOTTLES DRAWN AEROBIC ONLY Blood Culture adequate volume   Culture   Final    NO GROWTH 3  DAYS Performed at [facility], [address] [place], [place] [address]    Report Status PENDING  Incomplete    Radiology Reports Ct Head Wo Contrast  Result Date: 07/04/2019 CLINICAL DATA:  Mental status. Fall 2 days ago. Positive COVID-25 May 2019. EXAM: CT HEAD WITHOUT CONTRAST TECHNIQUE: Contiguous axial images were obtained from the base of the skull through the vertex without intravenous contrast. COMPARISON:  None. FINDINGS: Brain: Ventricles, cisterns and other CSF spaces are normal. There is mild chronic ischemic microvascular disease present. There is no mass, mass effect, shift of midline structures or acute hemorrhage. No evidence of acute infarction. Vascular: No hyperdense vessel or unexpected calcification. Skull: Normal. Negative for fracture or focal lesion. Sinuses/Orbits: No acute finding. Other: None. IMPRESSION: No acute findings. Mild chronic ischemic microvascular disease. Electronically Signed   By: Marin Olp M.D.   On: 07/04/2019 07:11   Dg Chest Port 1 View  Result Date: 07/04/2019 CLINICAL DATA:  Altered mental status and hypotension EXAM: PORTABLE CHEST 1 VIEW COMPARISON:  06/03/2019 FINDINGS: Mild cardiomegaly accentuated by low volumes. Otherwise normal mediastinal contours. There is no edema, consolidation, effusion, or pneumothorax. Postoperative proximal left humerus. IMPRESSION: No evidence of acute disease. Electronically Signed   By: Monte Fantasia M.D.   On: 07/04/2019 05:26

## 2019-07-07 NOTE — Progress Notes (Signed)
Physical Therapy Treatment Patient Details Name: KARENDA Kemp MRN: [id number] DOB: 1951-03-19 Today's Date: 07/07/2019    History of Present Illness 68 y/o female pt w/ hx of anxiety, arthritis, back pain, benign neoplasm of colon, breast abscess, cervical radiculopathy, CKD III, depression, diastolic HF, DM II, glaucoma, gout, HLD, morbid obesity, obstructive sleep apnea, pericardial effusion, pulmonary HTN, hx of COVID in 06/20.    PT Comments    The patient is more alert this pm visit. Did follow more directions. Assisted to bed via lift and repositioned in bed.  patient could benefit from interdry cloths for pannus folds. Continue PT for mobility.  Follow Up Recommendations  SNF     Equipment Recommendations  None recommended by PT    Recommendations for Other Services       Precautions / Restrictions Precautions Precautions: Fall;Back Precaution Comments: flexiseal, purewick    Mobility  Bed Mobility Overal bed mobility: Needs Assistance Bed Mobility: Rolling Rolling: +2 for safety/equipment;+2 for physical assistance;Total assist         General bed mobility comments: rolling to each side for maximove pad and bed pad to be removed and provide pericare, required use of bed pad, pt. did flex knees slightly in prep for roll, placed hand to rail to assist.   Transfers                 General transfer comment: maximove to bed.  Ambulation/Gait                 Stairs             Wheelchair Mobility    Modified Rankin (Stroke Patients Only)       Balance                                            Cognition Arousal/Alertness: Awake/alert Behavior During Therapy: Flat affect Overall Cognitive Status: Impaired/Different from baseline Area of Impairment: Attention;Memory;Following commands                 Orientation Level: Person;Place;Time Current Attention Level: Divided Memory: Decreased short-term  memory Following Commands: Follows one step commands consistently     Problem Solving: Slow processing;Decreased initiation;Difficulty sequencing;Requires verbal cues;Requires tactile cues General Comments: pt isre awake than earlier today, did ask questions aboutrt BP, followed simple directions.      Exercises      General Comments        Pertinent Vitals/Pain Pain Assessment: Faces Faces Pain Scale: Hurts whole lot Pain Location: during pericare Pain Descriptors / Indicators: Discomfort;Moaning;Grimacing;Guarding Pain Intervention(s): Monitored during session;Limited activity within patient's tolerance    Home Living                      Prior Function            PT Goals (current goals can now be found in the care plan section) Progress towards PT goals: (lethargic)    Frequency    Min 2X/week      PT Plan Current plan remains appropriate    Co-evaluation PT/OT/SLP Co-Evaluation/Treatment: Yes Reason for Co-Treatment: Complexity of the patient's impairments (multi-system involvement);Necessary to address cognition/behavior during functional activity;For patient/therapist safety;To address functional/ADL transfers PT goals addressed during session: Mobility/safety with mobility OT goals addressed during session: ADL's and self-care;Strengthening/ROM      AM-PAC PT "6 Clicks" Mobility  Outcome Measure  Help needed turning from your back to your side while in a flat bed without using bedrails?: Total Help needed moving from lying on your back to sitting on the side of a flat bed without using bedrails?: Total Help needed moving to and from a bed to a chair (including a wheelchair)?: Total Help needed standing up from a chair using your arms (e.g., wheelchair or bedside chair)?: Total Help needed to walk in hospital room?: Total Help needed climbing 3-5 steps with a railing? : Total 6 Click Score: 6    End of Session Equipment Utilized During  Treatment: (maximove) Activity Tolerance: Patient tolerated treatment well Patient left: in bed;with call bell/phone within reach;with bed alarm set;with family/visitor present Nurse Communication: Mobility status;Need for lift equipment PT Visit Diagnosis: Other abnormalities of gait and mobility (R26.89);History of falling (Z91.81)     Time: [id number] PT Time Calculation (min) (ACUTE ONLY): 25 min  Charges:  $Therapeutic Activity: 23-37 mins                     Tresa Endo PT Acute Rehabilitation Services Pager [phone] Office [phone]    [name] 07/07/2019, 4:32 PM

## 2019-07-07 NOTE — Progress Notes (Signed)
Physical Therapy Treatment Patient Details Name: Alice Kemp MRN: [id number] DOB: 1951-01-23 Today's Date: 07/07/2019    History of Present Illness 68 y/o female pt w/ hx of anxiety, arthritis, back pain, benign neoplasm of colon, breast abscess, cervical radiculopathy, CKD III, depression, diastolic HF, DM II, glaucoma, gout, HLD, morbid obesity, obstructive sleep apnea, pericardial effusion, pulmonary HTN, hx of COVID in 06/20.    PT Comments    The patient is significantly more  Lethargic today, barely opens eyes, requires constant stimulation. Poorly follows commands, is hallucinating also. The patient was asssited to [place]. Continue PT efforts to improve mobility.   Follow Up Recommendations  SNF     Equipment Recommendations  None recommended by PT    Recommendations for Other Services       Precautions / Restrictions Precautions Precautions: Fall;Back Precaution Comments: flexiseal    Mobility  Bed Mobility Overal bed mobility: Needs Assistance Bed Mobility: Rolling Rolling: +2 for safety/equipment;+2 for physical assistance;Total assist         General bed mobility comments: rolling to each side for maximove pad, required use of bed pad, pt. did flex knees slightly in prep for roll, placed hand to rail to assist. poor ability to keep hand on rail, jerking motions of trunk and extremities.  Transfers                 General transfer comment: maximove to recliner  Ambulation/Gait                 Stairs             Wheelchair Mobility    Modified Rankin (Stroke Patients Only)       Balance                                            Cognition Arousal/Alertness: Lethargic   Overall Cognitive Status: Impaired/Different from baseline(different than last encounter w/ PT 9/29) Area of Impairment: Attention;Memory;Following commands                 Orientation Level:  Person;Place;Time Current Attention Level: Divided Memory: Decreased short-term memory Following Commands: Follows one step commands consistently     Problem Solving: Slow processing;Decreased initiation;Difficulty sequencing;Requires verbal cues;Requires tactile cues General Comments: pt is much more lethargic, hallucinating, requires constant tactile stim to arouse.      Exercises      General Comments        Pertinent Vitals/Pain Faces Pain Scale: Hurts little more Pain Location: when pannus lifted to place fabric Pain Descriptors / Indicators: Discomfort;Moaning Pain Intervention(s): Monitored during session;Limited activity within patient's tolerance    Home Living                      Prior Function            PT Goals (current goals can now be found in the care plan section) Progress towards PT goals: (lethargic)    Frequency    Min 2X/week      PT Plan Current plan remains appropriate    Co-evaluation PT/OT/SLP Co-Evaluation/Treatment: Yes Reason for Co-Treatment: For patient/therapist safety PT goals addressed during session: Mobility/safety with mobility OT goals addressed during session: ADL's and self-care      AM-PAC PT "6 Clicks" Mobility   Outcome Measure  Help needed turning from your  back to your side while in a flat bed without using bedrails?: Total Help needed moving from lying on your back to sitting on the side of a flat bed without using bedrails?: Total Help needed moving to and from a bed to a chair (including a wheelchair)?: Total Help needed standing up from a chair using your arms (e.g., wheelchair or bedside chair)?: Total Help needed to walk in hospital room?: Total Help needed climbing 3-5 steps with a railing? : Total 6 Click Score: 6    End of Session Equipment Utilized During Treatment: (maximove) Activity Tolerance: Patient limited by fatigue;Patient limited by lethargy Patient left: in chair;with call  bell/phone within reach Nurse Communication: Mobility status;Need for lift equipment PT Visit Diagnosis: Other abnormalities of gait and mobility (R26.89);History of falling (Z91.81)     Time: [id number] PT Time Calculation (min) (ACUTE ONLY): 33 min  Charges:  $Therapeutic Activity: 8-22 mins                     Tresa Endo PT Acute Rehabilitation Services Pager [phone] Office [phone]    [name] 07/07/2019, 2:53 PM

## 2019-07-07 NOTE — TOC Transition Note (Signed)
Transition of Care [facility]) - CM/SW Discharge Note   Patient Details  Name: LASHONE WININGS MRN: [id number] Date of Birth: 1951-08-23  Transition of Care [facility]) CM/SW Contact:  Weston Anna, LCSW Phone Number: 07/07/2019, 3:29 PM   Clinical Narrative:     CSW initiated insurance authorization for [facility]. Will continue to update.    Final next level of care: Skilled Nursing Facility Barriers to Discharge: Continued Medical Work up, Ship broker   Patient Goals and CMS Choice Patient states their goals for this hospitalization and ongoing recovery are:: Rehab and get stronger CMS Medicare.gov Compare Post Acute Care list provided to:: Patient Represenative (must comment)(mother) Choice offered to / list presented to : Parent  Discharge Placement                       Discharge Plan and Services In-house Referral: Clinical Social Work Discharge Planning Services: NA Post Acute Care Choice: Skilled Nursing Facility          DME Arranged: N/A DME Agency: NA       HH Arranged: NA [place] Agency: NA        Social Determinants of Health (SDOH) Interventions     Readmission Risk Interventions Readmission Risk Prevention Plan 07/05/2019  Transportation Screening Complete  PCP or Specialist Appt within 3-5 Days Complete  HRI or [place] Complete  Social Work Consult for [place] Planning/Counseling Complete  Palliative Care Screening Not Applicable  Medication Review Press photographer) Complete  Some recent data might be hidden

## 2019-07-07 NOTE — Progress Notes (Signed)
  Speech Language Pathology Treatment: Dysphagia  Patient Details Name: Alice Kemp MRN: [id number] DOB: November 24, 1950 Today's Date: 07/07/2019 Time: [id number] SLP Time Calculation (min) (ACUTE ONLY): 12 min  Assessment / Plan / Recommendation Clinical Impression  Pt remains confused today and still needs assist with feeding. She consumed purees with slow oral transit but adequate oral clearance. She tries to talk with food in her mouth at times. Mod cues were provided for sustained attention and smaller bolus sizes with thin liquids. When allowed to self-pace, she drinks large volumes at once and has an immediate cough. This is eliminated with more pauses between sips, achieved by removing the straw from her lips. Recommend full supervision during meals. Given her mentation, advanced trials were deferred today.   HPI HPI: Pt is a 68 yo female presenting with AMS from SNF, where she went last month after lumbar decompression surgery. She tested positive for COVID-19 (of note, pt reportedly tested positive in June; chart indicates positive test 8/18 and nmegative test 8/29). PMH: OSA, HLD, glaucoma, HF, DM, depression, CKD, back pain, anxiety      SLP Plan  Continue with current plan of care       Recommendations  Diet recommendations: Dysphagia 1 (puree);Thin liquid Liquids provided via: Cup;Straw Medication Administration: Crushed with puree Supervision: Staff to assist with self feeding;Full supervision/cueing for compensatory strategies Compensations: Slow rate;Beem sips/bites Postural Changes and/or Swallow Maneuvers: Seated upright 90 degrees;Upright 30-60 min after meal                Oral Care Recommendations: Oral care BID Follow up Recommendations: Skilled Nursing facility SLP Visit Diagnosis: Dysphagia, unspecified (R13.10) Plan: Continue with current plan of care       GO                Venita Sheffield Cyla Haluska 07/07/2019, 8:52 AM  Pollyann Glen, M.A. [place] [facility]  Environmental education officer [phone] Office [phone]

## 2019-07-07 NOTE — Progress Notes (Signed)
Occupational Therapy Evaluation Patient Details Name: Alice Kemp MRN: [id number] DOB: 06/16/1951 Today's Date: 07/07/2019    History of Present Illness 68 y/o female pt w/ hx of anxiety, arthritis, back pain, benign neoplasm of colon, breast abscess, cervical radiculopathy, CKD III, depression, diastolic HF, DM II, glaucoma, gout, HLD, morbid obesity, obstructive sleep apnea, pericardial effusion, pulmonary HTN, hx of COVID in 06/20. pt underwent decompressive laminectomy L3 partial inferior laminectomy L2 and microscopic discectomy from the left and foraminotomies of the left L3 nerve root on 05/30/19. She had a fall on 07/02/2019, admitted with AMS, hypotension; C-diff.    Clinical Impression   Admitted from SNF where she had been undergoing rehab s/p back surgery 8/24. Pt confused and appears to be hallucinating, talking about people looking at her through the window, reaching for objects that arn't present. Lifted to recliner with +2 Max A and use of Maximove. Max A with self feeding an grooming once in chair. Will need reahb at SNF. Noted what appears to be moisture associated skin damage underneath pannus. Area cleansed and dried, antifungal powder applied in addition to moisture wicking pillowcase. Pt may benefit from interdry material - consult wound nurse. Recommend staff mobilize pt OOB daily with use of Maximove.  Will follow acutely.    Follow Up Recommendations  SNF;Supervision/Assistance - 24 hour    Equipment Recommendations  None recommended by OT    Recommendations for Other Services       Precautions / Restrictions Precautions Precautions: Fall;Back Precaution Comments: flexiseal, purewick      Mobility Bed Mobility Overal bed mobility: Needs Assistance Bed Mobility: Rolling Rolling: +2 for safety/equipment;+2 for physical assistance;Total assist(pt assisting with trying to hold rails) Sidelying to sit: Max assist       General bed mobility comments: rolling  to each side for maximove pad, required use of bed pad, pt. did flex knees slightly in prep for roll, placed hand to rail to assist. poor ability to keep hand on rail, jerking motions of trunk and extremities.  Transfers                 General transfer comment: maximove to bed.    Balance     Sitting balance-Leahy Scale: Poor                                     ADL either performed or assessed with clinical judgement   ADL Overall ADL's : Needs assistance/impaired Eating/Feeding: Maximal assistance Eating/Feeding Details (indicate cue type and reason): hand over hand required to feed  and maintain gripon spoon; jerking motions  Grooming: Maximal assistance Grooming Details (indicate cue type and reason): able to wipe mouth spontaneously                               General ADL Comments: total A wtih other ADL tasks     Vision         Perception     Praxis      Pertinent Vitals/Pain Pain Assessment: Faces Faces Pain Scale: Hurts whole lot Pain Location: during pericare Pain Descriptors / Indicators: Discomfort;Moaning;Grimacing;Guarding Pain Intervention(s): Limited activity within patient's tolerance     Hand Dominance Right   Extremity/Trunk Assessment Upper Extremity Assessment Upper Extremity Assessment: Generalized weakness LUE Deficits / Details: Decreased AROM prior to admission--old shoulder sx   Lower Extremity Assessment  Lower Extremity Assessment: Defer to PT evaluation   Cervical / Trunk Assessment Cervical / Trunk Assessment: Other exceptions(hx of recent back surgery in August; morbid obesity)   Communication Communication Communication: Other (comment)(very soft spoken)   Cognition Arousal/Alertness: Awake/alert Behavior During Therapy: Flat affect Overall Cognitive Status: Impaired/Different from baseline Area of Impairment: Orientation;Attention;Memory;Following commands;Safety/judgement;Awareness;Problem  solving                 Orientation Level: Disoriented to;Place;Time;Situation Current Attention Level: Focused Memory: Decreased recall of precautions;Decreased short-term memory Following Commands: Follows one step commands inconsistently Safety/Judgement: Decreased awareness of safety;Decreased awareness of deficits Awareness: Intellectual Problem Solving: Slow processing;Decreased initiation;Difficulty sequencing;Requires verbal cues;Requires tactile cues    General Comments       Exercises     Shoulder Instructions      Home Living Family/patient expects to be discharged to:: Skilled nursing facility                                        Prior Functioning/Environment Level of Independence: Independent        Comments: has been at [facility] and needing increased A to complete functional mob(Prior to August, was independent with ADL adn mobility)        OT Problem List: Decreased strength;Decreased range of motion;Decreased activity tolerance;Impaired balance (sitting and/or standing);Impaired vision/perception;Decreased cognition;Decreased safety awareness;Decreased knowledge of use of DME or AE;Cardiopulmonary status limiting activity;Decreased knowledge of precautions;Obesity;Impaired UE functional use;Pain      OT Treatment/Interventions: Self-care/ADL training;Therapeutic exercise;Neuromuscular education;Energy conservation;DME and/or AE instruction;Therapeutic activities;Cognitive remediation/compensation;Visual/perceptual remediation/compensation;Patient/family education;Balance training    OT Goals(Current goals can be found in the care plan section) Acute Rehab OT Goals Patient Stated Goal: pt unable to state OT Goal Formulation: Patient unable to participate in goal setting Time For Goal Achievement: 07/21/19 Potential to Achieve Goals: Fair  OT Frequency: Min 2X/week   Barriers to D/C:            Co-evaluation PT/OT/SLP  Co-Evaluation/Treatment: Yes Reason for Co-Treatment: Complexity of the patient's impairments (multi-system involvement);Necessary to address cognition/behavior during functional activity;For patient/therapist safety;To address functional/ADL transfers PT goals addressed during session: Mobility/safety with mobility OT goals addressed during session: ADL's and self-care;Strengthening/ROM      AM-PAC OT "6 Clicks" Daily Activity     Outcome Measure Help from another person eating meals?: A Lot Help from another person taking care of personal grooming?: A Lot Help from another person toileting, which includes using toliet, bedpan, or urinal?: Total Help from another person bathing (including washing, rinsing, drying)?: Total Help from another person to put on and taking off regular upper body clothing?: Total Help from another person to put on and taking off regular lower body clothing?: Total 6 Click Score: 8   End of Session Nurse Communication: Mobility status;Need for lift equipment  Activity Tolerance: Patient tolerated treatment well Patient left: in chair;with call bell/phone within reach  OT Visit Diagnosis: Other abnormalities of gait and mobility (R26.89);Muscle weakness (generalized) (M62.81);Other symptoms and signs involving cognitive function;Pain Pain - part of body: (pannus)                Time: [id number] OT Time Calculation (min): 49 min Charges:  OT General Charges $OT Visit: 1 Visit OT Evaluation $OT Eval Moderate Complexity: [place], OT/L   Acute OT Clinical Specialist [place] Pager [phone] Office [phone]   [facility] 07/07/2019,  4:51 PM

## 2019-07-07 NOTE — Progress Notes (Signed)
Mother called and updated. All questions answered, although I feel as if mother was unable to understand even with it being explained at her level. She was able to talk to pt, pt asked "where is daddy" and didn't not feel like talking much. This seemed to upset the mother and she didn't understand why pt was so confused and kept saying "Her dad has passed, she knows this, why is she asking that?" Education and support provided to family.

## 2019-07-08 DIAGNOSIS — G9349 Other encephalopathy: Secondary | ICD-10-CM | POA: Diagnosis not present

## 2019-07-08 DIAGNOSIS — U071 COVID-19: Secondary | ICD-10-CM | POA: Diagnosis not present

## 2019-07-08 LAB — COMPREHENSIVE METABOLIC PANEL
ALT: 13 U/L (ref 0–44)
AST: 18 U/L (ref 15–41)
Albumin: 1.6 g/dL — ABNORMAL LOW (ref 3.5–5.0)
Alkaline Phosphatase: 148 U/L — ABNORMAL HIGH (ref 38–126)
Anion gap: 9 (ref 5–15)
BUN: 73 mg/dL — ABNORMAL HIGH (ref 8–23)
CO2: 23 mmol/L (ref 22–32)
Calcium: 7.8 mg/dL — ABNORMAL LOW (ref 8.9–10.3)
Chloride: 102 mmol/L (ref 98–111)
Creatinine, Ser: 1.61 mg/dL — ABNORMAL HIGH (ref 0.44–1.00)
GFR calc Af Amer: 38 mL/min — ABNORMAL LOW (ref >60)
GFR calc non Af Amer: 33 mL/min — ABNORMAL LOW (ref >60)
Glucose, Bld: 186 mg/dL — ABNORMAL HIGH (ref 70–99)
Potassium: 4 mmol/L (ref 3.5–5.1)
Sodium: 134 mmol/L — ABNORMAL LOW (ref 135–145)
Total Bilirubin: 0.3 mg/dL (ref 0.3–1.2)
Total Protein: 5.4 g/dL — ABNORMAL LOW (ref 6.5–8.1)

## 2019-07-08 LAB — CBC WITH DIFFERENTIAL/PLATELET
Abs Immature Granulocytes: 0 10*3/uL (ref 0.00–0.07)
Band Neutrophils: 47 %
Basophils Absolute: 0 10*3/uL (ref 0.0–0.1)
Basophils Relative: 0 %
Eosinophils Absolute: 0.2 10*3/uL (ref 0.0–0.5)
Eosinophils Relative: 2 %
HCT: 33 % — ABNORMAL LOW (ref 36.0–46.0)
Hemoglobin: 10.1 g/dL — ABNORMAL LOW (ref 12.0–15.0)
Lymphocytes Relative: 8 %
Lymphs Abs: 0.8 10*3/uL (ref 0.7–4.0)
MCH: 27.6 pg (ref 26.0–34.0)
MCHC: 30.6 g/dL (ref 30.0–36.0)
MCV: 90.2 fL (ref 80.0–100.0)
Monocytes Absolute: 0.4 10*3/uL (ref 0.1–1.0)
Monocytes Relative: 4 %
Neutro Abs: 8.9 10*3/uL — ABNORMAL HIGH (ref 1.7–7.7)
Neutrophils Relative %: 39 %
Platelets: 426 10*3/uL — ABNORMAL HIGH (ref 150–400)
RBC: 3.66 MIL/uL — ABNORMAL LOW (ref 3.87–5.11)
RDW: 16.9 % — ABNORMAL HIGH (ref 11.5–15.5)
WBC: 10.3 10*3/uL (ref 4.0–10.5)
nRBC: 0.2 % (ref 0.0–0.2)

## 2019-07-08 LAB — MAGNESIUM: Magnesium: 1.7 mg/dL (ref 1.7–2.4)

## 2019-07-08 LAB — GLUCOSE, CAPILLARY
Glucose-Capillary: 180 mg/dL — ABNORMAL HIGH (ref 70–99)
Glucose-Capillary: 176 mg/dL — ABNORMAL HIGH (ref 70–99)
Glucose-Capillary: 240 mg/dL — ABNORMAL HIGH (ref 70–99)

## 2019-07-08 LAB — TYPE AND SCREEN
ABO/RH(D): A POS
Antibody Screen: NEGATIVE

## 2019-07-08 LAB — D-DIMER, QUANTITATIVE: D-Dimer, Quant: 3.01 ug/mL-FEU — ABNORMAL HIGH (ref 0.00–0.50)

## 2019-07-08 LAB — BRAIN NATRIURETIC PEPTIDE: B Natriuretic Peptide: 72.7 pg/mL (ref 0.0–100.0)

## 2019-07-08 MED ORDER — VANCOMYCIN 50 MG/ML ORAL SOLUTION: 500.0000 mg | Freq: Four times a day (QID) | ORAL | Status: DC

## 2019-07-08 MED ORDER — LACTATED RINGERS IV SOLN
INTRAVENOUS | Status: DC
  Administered 2019-07-08 – 2019-07-10 (×6): via INTRAVENOUS

## 2019-07-08 MED ORDER — VANCOMYCIN 50 MG/ML ORAL SOLUTION
500.0000 mg | Freq: Four times a day (QID) | ORAL | Status: DC
  Administered 2019-07-08 – 2019-07-22 (×49): 500 mg via ORAL
  Filled 2019-07-08 (×56): qty 10

## 2019-07-08 NOTE — Progress Notes (Addendum)
PROGRESS NOTE                                                                                                                                                                                                             Patient Demographics:    Alice Kemp, is a 68 y.o. female, DOB - [birth year], TTS:[id number]  Outpatient Primary MD for the patient is [name], MD    LOS - 4  Admit date - 07/04/2019    Chief Complaint  Patient presents with  . Fall       Brief Narrative  Alice Kemp is a 68 y.o. female with medical history significant of hypothyroidism; OSA; morbid obesity (BMI 44); HLD; DM; diastolic CHF; and stage 3 CKD presenting with a fall.    She was admitted to the hospital on 05/24/2019 for a spinal stenosis status post lumbar laminectomy, she was discharged to the SNF on 824.  She also was found to have COVID-19 infection which was incidental finding during that hospitalization.  She tested negative on 06/04/2019.    Patient now was residing at [facility], she reportedly had a fall on 07/02/2019, she then came to the ER and was discharged after a stable evaluation back to SNF.  Subsequently on 07/03/2019 she developed profuse diarrhea and was brought in with severe dehydration and AKI and admitted to the hospital on 07/04/2019.   Subjective:   Patient in bed although in no distress appears extremely frail and deconditioned, denies any headache chest or abdominal pain.  Denies shortness of breath.   Assessment  & Plan :     1. COVID-19 infection during the ongoing 2020 Covid 19 Pandemic - her initial incidental positive was 05/24/2019, she was asymptomatic from this standpoint, she likely had a false negative test on 06/04/2019.  She is positive again on 07/04/2019 again incidentally while her main symptoms are C. difficile induced diarrhea.  She has no pulmonary symptoms and clinically appears stable from  COVID-19 infection standpoint.   COVID-19 Labs  Recent Labs    07/06/19 0440 07/07/19 0318 07/08/19 0227  DDIMER 1.69* 2.40* 3.01*  FERRITIN 115 109  --   LDH 107 125  --   CRP 13.6* 18.7*  --     Lab Results  Component Value Date   SARSCOV2NAA POSITIVE (A) 07/04/2019  SARSCOV2NAA NOT DETECTED 06/04/2019   SARSCOV2NAA POSITIVE (A) 05/24/2019     Hepatic Function Latest Ref Rng & Units 07/08/2019 07/07/2019 07/06/2019  Total Protein 6.5 - 8.1 g/dL 5.4(L) 5.5(L) 5.6(L)  Albumin 3.5 - 5.0 g/dL 1.6(L) 1.6(L) 1.8(L)  AST 15 - 41 U/L _0 ALT 0 - 44 U/L _1 Alk Phosphatase 38 - 126 U/L 148(H) 142(H) 129(H)  Total Bilirubin 0.3 - 1.2 mg/dL 0.3 0.2(L) 0.2(L)  Bilirubin, Direct 0.0 - 0.3 mg/dL - - -        Component Value Date/Time   BNP 72.7 07/08/2019 0227      2.  Severe C. difficile colitis with diarrhea causing dehydration and AKI.  Placed on vancomycin, stopped PPI and stool softeners, oral vancomycin and monitor.  Despite 48 hours of high-dose vancomycin she continues to have profuse diarrhea and getting dehydrated, will add fidaxomicin on 07/07/2019. Continue IVF.  3.  History of depression.  Continue combination of amitriptyline, Wellbutrin and Cymbalta.  4.  Morbid obesity.  BMI 44.  Follow with PCP for weight loss.  5.  Hypothyroidism.  Continue home dose Synthroid.    6.  Hypertension.  Blood pressure was low due to dehydration, hold blood pressure medications.  Continue IV fluids.  7.  Recent L-spine spinal stenosis surgery last month on 05/30/2019 by Dr. Saintclair Halsted.  Supportive care.   8.  Dysphagia.  Speech following currently on dysphagia 1 diet, oral intake is poor added pro-stat as well.    9. DM type II.  On Lantus and sliding scale will monitor and adjust.  Poor outpatient control due to hyperglycemia with A1c of 8.9 on 05/24/2019.  CBG (last 3)  Recent Labs    07/07/19 1615 07/07/19 2120 07/08/19 0712  GLUCAP 166* 152* 180*    Lab Results   Component Value Date   HGBA1C 8.9 (H) 05/24/2019     Condition - Extremely Guarded, she is DNR and already has a most form from her home which suggest that she has poor baseline to begin with.  Her only listed family member is her mother.  Family Communication  : Mother on & brother [facility] [phone]  on 07/08/19 - they understands poor prognosis and poor baseline.  Code Status : DNR  Diet :    Diet Order            DIET - DYS 1 Room service appropriate? Yes; Fluid consistency: Thin  Diet effective now               Disposition Plan  : Inpatient, may require SNF.  Consults  : None  Procedures  :     PUD Prophylaxis :  PPI DC'd  DVT Prophylaxis  :  Lovenox    Lab Results  Component Value Date   PLT 426 (H) 07/08/2019    Inpatient Medications  Scheduled Meds: . aspirin  81 mg Oral Daily  . buPROPion  200 mg Oral Daily  . DULoxetine  30 mg Oral BID  . enoxaparin (LOVENOX) injection  40 mg Subcutaneous Q24H  . feeding supplement (PRO-STAT SUGAR FREE 64)  30 mL Oral TID WC  . gabapentin  300 mg Oral TID  . insulin aspart  0-15 Units Subcutaneous TID WC  . insulin aspart  0-5 Units Subcutaneous QHS  . insulin glargine  10 Units Subcutaneous Daily  . levothyroxine  88 mcg Oral QAC breakfast  . rosuvastatin  5 mg Oral Daily  .  vancomycin  500 mg Oral Q6H   Continuous Infusions: . sodium chloride     PRN Meds:.sodium chloride, acetaminophen, [DISCONTINUED] ondansetron **OR** ondansetron (ZOFRAN) IV, sodium chloride flush  Antibiotics  :    Anti-infectives (From admission, onward)   Start     Dose/Rate Route Frequency Ordered Stop   07/05/19 1600  remdesivir 100 mg in sodium chloride 0.9 % 250 mL IVPB  Status:  Discontinued     100 mg 500 mL/hr over 30 Minutes Intravenous Every 24 hours 07/04/19 1517 07/05/19 0734   07/04/19 1600  remdesivir 200 mg in sodium chloride 0.9 % 250 mL IVPB     200 mg 500 mL/hr over 30 Minutes Intravenous Once 07/04/19 1517  07/04/19 1941   07/04/19 1400  vancomycin (VANCOCIN) 50 mg/mL oral solution 500 mg     500 mg Oral Every 6 hours 07/04/19 1134         Time Spent in minutes  30   Lala Lund M.D on 07/08/2019 at 9:42 AM  To page go to [email or web address] - password [facility]  [facility] -  Office  [phone]  See all Orders from today for further details    Objective:   Vitals:   07/08/19 0100 07/08/19 0200 07/08/19 0400 07/08/19 0827  BP:   114/80 121/63  Pulse: (!) 101 98 99 98  Resp:   20 18  Temp:   97.9 F (36.6 C) 99.3 F (37.4 C)  TempSrc:   Oral Axillary  SpO2: 96% 95% 97% 98%  Weight:      Height:        Wt Readings from Last 3 Encounters:  07/04/19 117 kg  06/05/19 117.8 kg  12/14/18 112.5 kg     Intake/Output Summary (Last 24 hours) at 07/08/2019 0942 Last data filed at 07/08/2019 0837 Gross per 24 hour  Intake 3113.3 ml  Output 1350 ml  Net 1763.3 ml     Physical Exam  Awake mildly confused but able to answer basic questions and follow basic commands, appears dehydrated, rectal tube in place [place].AT,PERRAL Supple Neck,No JVD, No cervical lymphadenopathy appriciated.  Symmetrical Chest wall movement, Good air movement bilaterally, CTAB RRR,No Gallops, Rubs or new Murmurs, No Parasternal Heave +ve B.Sounds, Abd Soft, No tenderness, No organomegaly appriciated, No rebound - guarding or rigidity. No Cyanosis, Clubbing or edema, No new Rash or bruise     Data Review:    CBC Recent Labs  Lab 07/04/19 0430 07/05/19 0300 07/06/19 0440 07/06/19 1622 07/06/19 2120 07/07/19 0318 07/08/19 0227  WBC 16.8* 12.1* 12.7*  --   --  13.9* 10.3  HGB 10.9* 9.6* 9.4* 9.8* 9.7* 9.6* 10.1*  HCT 34.4* 31.7* 30.8* 32.0* 31.3* 31.2* 33.0*  PLT 469* 411* 464*  --   --  444* 426*  MCV 89.8 90.8 89.8  --   --  90.4 90.2  MCH 28.5 27.5 27.4  --   --  27.8 27.6  MCHC 31.7 30.3 30.5  --   --  30.8 30.6  RDW 16.1* 16.3* 16.5*  --   --  16.6* 16.9*  LYMPHSABS 3.9 0.8 3.1  --    --  3.1 0.8  MONOABS 1.9* 0.1 1.5*  --   --  1.3* 0.4  EOSABS 0.4 0.0 0.0  --   --  0.2 0.2  BASOSABS 0.1 0.0 0.1  --   --  0.1 0.0    Chemistries  Recent Labs  Lab 07/04/19 0430 07/05/19 0300 07/06/19 0440 07/07/19 9833  07/08/19 0227  NA 132* 132* 131* 134* 134*  K 4.2 4.3 4.0 4.0 4.0  CL 100 99 99 103 102  CO2 22 20* _0 GLUCOSE 164* 240* 158* 97 186*  BUN 72* 74* 87* 81* 73*  CREATININE 2.42* 2.09* 1.93* 1.80* 1.61*  CALCIUM 8.0* 7.9* 7.7* 7.9* 7.8*  MG  --  1.9 1.9 1.8 1.7  AST 17 13* _1 ALT _2 ALKPHOS 122 135* 129* 142* 148*  BILITOT 0.5 0.3 0.2* 0.2* 0.3   ------------------------------------------------------------------------------------------------------------------ No results for input(s): CHOL, HDL, LDLCALC, TRIG, CHOLHDL, LDLDIRECT in the last 72 hours.  Lab Results  Component Value Date   HGBA1C 8.9 (H) 05/24/2019   ------------------------------------------------------------------------------------------------------------------ Recent Labs    07/07/19 0318  TSH 1.011    Cardiac Enzymes No results for input(s): CKMB, TROPONINI, MYOGLOBIN in the last 168 hours.  Invalid input(s): CK ------------------------------------------------------------------------------------------------------------------    Component Value Date/Time   BNP 72.7 07/08/2019 0227    Micro Results Recent Results (from the past 240 hour(s))  C Difficile Quick Screen w PCR reflex     Status: Abnormal   Collection Time: 07/04/19  4:54 AM   Specimen: Nasopharyngeal Swab; Stool  Result Value Ref Range Status   C Diff antigen POSITIVE (A) NEGATIVE Final   C Diff toxin NEGATIVE NEGATIVE Final   C Diff interpretation Results are indeterminate. See PCR results.  Final    Comment: Performed at [facility], [place] [address] [place], [place] [address]  SARS Coronavirus 2 [facility] order, Performed in [facility] hospital lab) Nasopharyngeal  Nasopharyngeal Swab     Status: Abnormal   Collection Time: 07/04/19  4:54 AM   Specimen: Nasopharyngeal Swab  Result Value Ref Range Status   SARS Coronavirus 2 POSITIVE (A) NEGATIVE Final    Comment: RESULT CALLED TO, READ BACK BY AND VERIFIED WITH: A. DENNIS,RN 0737 07/04/2019 T. TYSOR (NOTE) If result is NEGATIVE SARS-CoV-2 target nucleic acids are NOT DETECTED. The SARS-CoV-2 RNA is generally detectable in upper and lower  respiratory specimens during the acute phase of infection. The lowest  concentration of SARS-CoV-2 viral copies this assay can detect is 250  copies / mL. A negative result does not preclude SARS-CoV-2 infection  and should not be used as the sole basis for treatment or other  patient management decisions.  A negative result may occur with  improper specimen collection / handling, submission of specimen other  than nasopharyngeal swab, presence of viral mutation(s) within the  areas targeted by this assay, and inadequate number of viral copies  (<250 copies / mL). A negative result must be combined with clinical  observations, patient history, and epidemiological information. If result is POSITIVE SARS-CoV-2 target nucleic acids are DETECTED.  The SARS-CoV-2 RNA is generally detectable in upper and lower  respiratory specimens during the acute phase of infection.  Positive  results are indicative of active infection with SARS-CoV-2.  Clinical  correlation with patient history and other diagnostic information is  necessary to determine patient infection status.  Positive results do  not rule out bacterial infection or co-infection with other viruses. If result is PRESUMPTIVE POSTIVE SARS-CoV-2 nucleic acids MAY BE PRESENT.   A presumptive positive result was obtained on the submitted specimen  and confirmed on repeat testing.  While 2019 novel coronavirus  (SARS-CoV-2) nucleic acids may be present in the submitted sample  additional confirmatory testing may  be necessary for epidemiological  and / or clinical management purposes  to differentiate between  SARS-CoV-2 and other Sarbecovirus currently known to infect humans.  If clinically indicated additional testing with an alternate test  methodology [phone])  is advised. The SARS-CoV-2 RNA is generally  detectable in upper and lower respiratory specimens during the acute  phase of infection. The expected result is Negative. Fact Sheet for Patients:  StrictlyIdeas.no Fact Sheet for Healthcare Providers: BankingDealers.co.za This test is not yet approved or cleared by the [place] FDA and has been authorized for detection and/or diagnosis of SARS-CoV-2 by FDA under an Emergency Use Authorization (EUA).  This EUA will remain in effect (meaning this test can be used) for the duration of the COVID-19 declaration under Section 564(b)(1) of the Act, 21 U.S.C. section 360bbb-3(b)(1), unless the authorization is terminated or revoked sooner. Performed at [facility], [place] [address], [place], [place] [address]   C. Diff by PCR, Reflexed     Status: Abnormal   Collection Time: 07/04/19  4:54 AM  Result Value Ref Range Status   Toxigenic C. Difficile by PCR POSITIVE (A) NEGATIVE Final    Comment: Positive for toxigenic C. difficile with little to no toxin production. Only treat if clinical presentation suggests symptomatic illness. Performed at [facility], [place] [address] [place], [place] [address]   Blood culture (routine x 2)     Status: None (Preliminary result)   Collection Time: 07/04/19  5:20 AM   Specimen: BLOOD  Result Value Ref Range Status   Specimen Description BLOOD LEFT ANTECUBITAL  Final   Special Requests   Final    BOTTLES DRAWN AEROBIC AND ANAEROBIC Blood Culture results may not be optimal due to an excessive volume of blood received in culture bottles   Culture   Final    NO GROWTH 4 DAYS Performed at [facility], [place] [address] [place], [place] [address]    Report Status PENDING  Incomplete  Blood culture (routine x 2)     Status: None (Preliminary result)   Collection Time: 07/04/19  5:24 AM   Specimen: BLOOD LEFT HAND  Result Value Ref Range Status   Specimen Description BLOOD LEFT HAND  Final   Special Requests   Final    BOTTLES DRAWN AEROBIC ONLY Blood Culture adequate volume   Culture   Final    NO GROWTH 4 DAYS Performed at [facility], [place] [address] [place], [place] [address]    Report Status PENDING  Incomplete  MRSA PCR Screening     Status: None   Collection Time: 07/07/19 10:22 AM   Specimen: Nasopharyngeal  Result Value Ref Range Status   MRSA by PCR NEGATIVE NEGATIVE Final    Comment:        The GeneXpert MRSA Assay (FDA approved for NASAL specimens only), is one component of a comprehensive MRSA colonization surveillance program. It is not intended to diagnose MRSA infection nor to guide or monitor treatment for MRSA infections. Performed at [facility], [place] [address] [place], [place] [address]     Radiology Reports Ct Head Wo Contrast  Result Date: 07/04/2019 CLINICAL DATA:  Mental status. Fall 2 days ago. Positive COVID-25 May 2019. EXAM: CT HEAD WITHOUT CONTRAST TECHNIQUE: Contiguous axial images were obtained from the base of the skull through the vertex without intravenous contrast. COMPARISON:  None. FINDINGS: Brain: Ventricles, cisterns and other CSF spaces are normal. There is mild chronic ischemic microvascular disease  present. There is no mass, mass effect, shift of midline structures or acute hemorrhage. No evidence of acute infarction. Vascular: No hyperdense vessel or unexpected calcification. Skull: Normal. Negative for fracture or focal lesion. Sinuses/Orbits: No acute finding. Other: None. IMPRESSION: No acute findings. Mild chronic ischemic microvascular disease. Electronically Signed   By: Marin Olp M.D.    On: 07/04/2019 07:11   Dg Chest Port 1 View  Result Date: 07/04/2019 CLINICAL DATA:  Altered mental status and hypotension EXAM: PORTABLE CHEST 1 VIEW COMPARISON:  06/03/2019 FINDINGS: Mild cardiomegaly accentuated by low volumes. Otherwise normal mediastinal contours. There is no edema, consolidation, effusion, or pneumothorax. Postoperative proximal left humerus. IMPRESSION: No evidence of acute disease. Electronically Signed   By: Monte Fantasia M.D.   On: 07/04/2019 05:26

## 2019-07-08 NOTE — Plan of Care (Signed)
  Problem: Education: Goal: Knowledge of risk factors and measures for prevention of condition will improve Outcome: Not Progressing   Problem: Coping: Goal: Psychosocial and spiritual needs will be supported Outcome: Not Progressing   Problem: Respiratory: Goal: Will maintain a patent airway Outcome: Not Progressing Goal: Complications related to the disease process, condition or treatment will be avoided or minimized Outcome: Not Progressing   

## 2019-07-08 NOTE — Progress Notes (Signed)
Spoke with patients Mother on the phone and updated and reiterated pt status. Pt continues to refuse medications. Will continue to monitor.

## 2019-07-08 NOTE — Progress Notes (Signed)
Pt continuously refuses meds and meals. Lantus, Lovenox, and other meds refused (see med chart). She will drink juice or ginger ale at times. Pt non-compliant with care plan regime. Will continue to monitor.

## 2019-07-09 DIAGNOSIS — U071 COVID-19: Secondary | ICD-10-CM | POA: Diagnosis not present

## 2019-07-09 DIAGNOSIS — G9349 Other encephalopathy: Secondary | ICD-10-CM | POA: Diagnosis not present

## 2019-07-09 LAB — GLUCOSE, CAPILLARY
Glucose-Capillary: 210 mg/dL — ABNORMAL HIGH (ref 70–99)
Glucose-Capillary: 201 mg/dL — ABNORMAL HIGH (ref 70–99)
Glucose-Capillary: 200 mg/dL — ABNORMAL HIGH (ref 70–99)

## 2019-07-09 LAB — CULTURE, BLOOD (ROUTINE X 2)
Culture: NO GROWTH
Culture: NO GROWTH
Special Requests: ADEQUATE

## 2019-07-09 NOTE — Progress Notes (Signed)
Pt bp continues to be soft. LR running at 143ml/hr. Pt placed in reverse trendelenburg. Pt resting with eyes closed, breathing normal, responds to voice when called with no s/s of distress noted. MD notified. Will continue to monitor.

## 2019-07-09 NOTE — Progress Notes (Signed)
Loss of IV access over the night. Several attempts made by two nurses per report along with attempts from day shift. Pt very edematous and dehydrated. Midline may be needed if unsuccessful.

## 2019-07-09 NOTE — Progress Notes (Signed)
Pt continues to be asymptomatic. Attempt previously made to place patient in chair, however it was unsuccessful. Flexiseal dislodge in progress. Pt cleaned and new sacral dressing placed and flexiseal not reinserted. Present BP 101/56 MAP 67. No other concerns at this time. Will continue to monitor.

## 2019-07-09 NOTE — Progress Notes (Signed)
Called RN regarding IV team consult. It is documented the patient still has 2 PIVs and no attempts to start IV were documented. Asked RN to assess patient and document any attempts and then re-consult IV if needed.

## 2019-07-09 NOTE — Progress Notes (Signed)
Pt reassessed. Bp 91/54 (66) asymptomatic. No other concerns at this time.

## 2019-07-09 NOTE — TOC Progression Note (Signed)
Transition of Care [facility]) - Progression Note    Patient Details  Name: Alice Kemp MRN: [id number] Date of Birth: 03/20/1951  [facility]) CM/SW Contact  Tien Aispuro, Francetta Found, LCSW Phone Number: 07/09/2019, 3:27 PM  Clinical Narrative:     Clinical Social Worker continuing to follow patient and family for support and discharge planning needs.  Patient was denied by insurance for another SNF stay at [facility].  CSW spoke with MD for [facility] (Dr. Amalia Hailey) who provided insight on what may make patient a better candidate for SNF placement-information relayed to attending MD.  Per Dr. Amalia Hailey, a peer to peer review may be too premature pending patient medical status.  CSW remains available for support and to further conversation with medical team and insurance to bridge the gap when patient medically stable.  Expected Discharge Plan: Skilled Nursing Facility Barriers to Discharge: Continued Medical Work up, Ship broker  Expected Discharge Plan and Services Expected Discharge Plan: Many In-house Referral: Clinical Social Work Discharge Planning Services: NA Post Acute Care Choice: [place] Living arrangements for the past 2 months: [place] Expected Discharge Date: 07/07/19               DME Arranged: N/A DME Agency: NA       HH Arranged: NA HH Agency: NA         Social Determinants of Health (SDOH) Interventions    Readmission Risk Interventions Readmission Risk Prevention Plan 07/05/2019  Transportation Screening Complete  PCP or Specialist Appt within 3-5 Days Complete  HRI or [place] Complete  Social Work Consult for [place] Planning/Counseling Complete  Palliative Care Screening Not Applicable  Medication Review Press photographer) Complete  Some recent data might be hidden

## 2019-07-09 NOTE — Progress Notes (Signed)
PROGRESS NOTE                                                                                                                                                                                                             Patient Demographics:    Alice Kemp, is a 68 y.o. female, DOB - [birth year], OVA:[id number]  Outpatient Primary MD for the patient is [name], MD    LOS - 5  Admit date - 07/04/2019    Chief Complaint  Patient presents with  . Fall       Brief Narrative  Alice Kemp is a 68 y.o. female with medical history significant of hypothyroidism; OSA; morbid obesity (BMI 44); HLD; DM; diastolic CHF; and stage 3 CKD presenting with a fall.    She was admitted to the hospital on 05/24/2019 for a spinal stenosis status post lumbar laminectomy, she was discharged to the SNF on 824.  She also was found to have COVID-19 infection which was incidental finding during that hospitalization.  She tested negative on 06/04/2019.    Patient now was residing at [facility], she reportedly had a fall on 07/02/2019, she then came to the ER and was discharged after a stable evaluation back to SNF.  Subsequently on 07/03/2019 she developed profuse diarrhea and was brought in with severe dehydration and AKI and admitted to the hospital on 07/04/2019.   Subjective:   Patient in bed, appears comfortable, denies any headache, no fever, no chest pain or pressure, no shortness of breath , no abdominal pain. No focal weakness.   Assessment  & Plan :     1. COVID-19 infection during the ongoing 2020 Covid 19 Pandemic - her initial incidental positive was 05/24/2019, she was asymptomatic from this standpoint, she likely had a false negative test on 06/04/2019.  She is positive again on 07/04/2019 again incidentally while her main symptoms are C. difficile induced diarrhea.  She has no pulmonary symptoms and clinically appears stable from  COVID-19 infection standpoint.   COVID-19 Labs  Recent Labs    07/07/19 0318 07/08/19 0227  DDIMER 2.40* 3.01*  FERRITIN 109  --   LDH 125  --   CRP 18.7*  --     Lab Results  Component Value Date   SARSCOV2NAA POSITIVE (A) 07/04/2019   SARSCOV2NAA NOT  DETECTED 06/04/2019   SARSCOV2NAA POSITIVE (A) 05/24/2019     Hepatic Function Latest Ref Rng & Units 07/08/2019 07/07/2019 07/06/2019  Total Protein 6.5 - 8.1 g/dL 5.4(L) 5.5(L) 5.6(L)  Albumin 3.5 - 5.0 g/dL 1.6(L) 1.6(L) 1.8(L)  AST 15 - 41 U/L _0 ALT 0 - 44 U/L _1 Alk Phosphatase 38 - 126 U/L 148(H) 142(H) 129(H)  Total Bilirubin 0.3 - 1.2 mg/dL 0.3 0.2(L) 0.2(L)  Bilirubin, Direct 0.0 - 0.3 mg/dL - - -        Component Value Date/Time   BNP 72.7 07/08/2019 0227      2.  Severe C. difficile colitis with diarrhea causing dehydration and AKI.  Placed on high dose vancomycin+ fidaxomicin added on 07/07/2019. Still having ++ diarrhea, Continue IVF.  3.  History of depression.  Continue combination of amitriptyline, Wellbutrin and Cymbalta.  4.  Morbid obesity.  BMI 44.  Follow with PCP for weight loss.  5.  Hypothyroidism.  Continue home dose Synthroid.    6.  Hypertension.  Blood pressure was low due to dehydration, hold blood pressure medications.  Continue IV fluids.  7.  Recent L-spine spinal stenosis surgery last month on 05/30/2019 by Dr. Saintclair Halsted.  Supportive care.   8.  Dysphagia.  Speech following currently on dysphagia 1 diet, oral intake is poor added pro-stat as well.    9. DM type II.  On Lantus and sliding scale will monitor and adjust.  Poor outpatient control due to hyperglycemia with A1c of 8.9 on 05/24/2019.  CBG (last 3)  Recent Labs    07/08/19 1121 07/08/19 2104 07/09/19 0758  GLUCAP 176* 240* 210*    Lab Results  Component Value Date   HGBA1C 8.9 (H) 05/24/2019     Condition - Extremely Guarded, she is DNR and already has a most form from her home which suggest that she has  poor baseline to begin with.  Her only listed family member is her mother.  Family Communication  : Mother on & brother [facility] [phone]  on 07/08/19 - they understands poor prognosis and poor baseline.  Code Status : DNR  Diet :    Diet Order            DIET - DYS 1 Room service appropriate? Yes; Fluid consistency: Thin  Diet effective now               Disposition Plan  : Inpatient, may require SNF.  Consults  : None  Procedures  :     PUD Prophylaxis :  PPI DC'd  DVT Prophylaxis  :  Lovenox    Lab Results  Component Value Date   PLT 426 (H) 07/08/2019    Inpatient Medications  Scheduled Meds: . aspirin  81 mg Oral Daily  . buPROPion  200 mg Oral Daily  . DULoxetine  30 mg Oral BID  . enoxaparin (LOVENOX) injection  40 mg Subcutaneous Q24H  . feeding supplement (PRO-STAT SUGAR FREE 64)  30 mL Oral TID WC  . gabapentin  300 mg Oral TID  . insulin aspart  0-15 Units Subcutaneous TID WC  . insulin aspart  0-5 Units Subcutaneous QHS  . insulin glargine  10 Units Subcutaneous Daily  . levothyroxine  88 mcg Oral QAC breakfast  . rosuvastatin  5 mg Oral Daily  . vancomycin  500 mg Oral Q6H   Continuous Infusions: . sodium chloride    . lactated ringers 75 mL/hr at  07/09/19 0339   PRN Meds:.sodium chloride, acetaminophen, [DISCONTINUED] ondansetron **OR** ondansetron (ZOFRAN) IV, sodium chloride flush  Antibiotics  :    Anti-infectives (From admission, onward)   Start     Dose/Rate Route Frequency Ordered Stop   07/09/19 2130  vancomycin (VANCOCIN) 50 mg/mL oral solution 500 mg  Status:  Discontinued     500 mg Oral Every 6 hours 07/08/19 2104 07/08/19 2146   07/08/19 2200  vancomycin (VANCOCIN) 50 mg/mL oral solution 500 mg     500 mg Oral Every 6 hours 07/08/19 2146     07/05/19 1600  remdesivir 100 mg in sodium chloride 0.9 % 250 mL IVPB  Status:  Discontinued     100 mg 500 mL/hr over 30 Minutes Intravenous Every 24 hours 07/04/19 1517 07/05/19  0734   07/04/19 1600  remdesivir 200 mg in sodium chloride 0.9 % 250 mL IVPB     200 mg 500 mL/hr over 30 Minutes Intravenous Once 07/04/19 1517 07/04/19 1941   07/04/19 1400  vancomycin (VANCOCIN) 50 mg/mL oral solution 500 mg  Status:  Discontinued     500 mg Oral Every 6 hours 07/04/19 1134 07/08/19 2104       Time Spent in minutes  [place] M.D on 07/09/2019 at 9:56 AM  To page go to [email or web address] - password [facility]  [facility] -  Office  [phone]  See all Orders from today for further details    Objective:   Vitals:   07/09/19 0050 07/09/19 0500 07/09/19 0800 07/09/19 0810  BP:  100/65 (!) 87/60 (!) 91/54  Pulse: 100 100 (!) 105 (!) 104  Resp:  15 16   Temp:  99.5 F (37.5 C) 98.8 F (37.1 C)   TempSrc:  Oral Oral   SpO2: 96%  95% 95%  Weight:      Height:        Wt Readings from Last 3 Encounters:  07/04/19 117 kg  06/05/19 117.8 kg  12/14/18 112.5 kg     Intake/Output Summary (Last 24 hours) at 07/09/2019 0956 Last data filed at 07/09/2019 0339 Gross per 24 hour  Intake 572.12 ml  Output -  Net 572.12 ml     Physical Exam  Awake looks better today, no FN deficits, flat affect, rectal tube in place [place].AT,PERRAL Supple Neck,No JVD, No cervical lymphadenopathy appriciated.  Symmetrical Chest wall movement, Good air movement bilaterally, CTAB RRR,No Gallops, Rubs or new Murmurs, No Parasternal Heave +ve B.Sounds, Abd Soft, No tenderness, No organomegaly appriciated, No rebound - guarding or rigidity. No Cyanosis, Clubbing or edema, No new Rash or bruise   Data Review:    CBC Recent Labs  Lab 07/04/19 0430 07/05/19 0300 07/06/19 0440 07/06/19 1622 07/06/19 2120 07/07/19 0318 07/08/19 0227  WBC 16.8* 12.1* 12.7*  --   --  13.9* 10.3  HGB 10.9* 9.6* 9.4* 9.8* 9.7* 9.6* 10.1*  HCT 34.4* 31.7* 30.8* 32.0* 31.3* 31.2* 33.0*  PLT 469* 411* 464*  --   --  444* 426*  MCV 89.8 90.8 89.8  --   --  90.4 90.2  MCH 28.5 27.5 27.4   --   --  27.8 27.6  MCHC 31.7 30.3 30.5  --   --  30.8 30.6  RDW 16.1* 16.3* 16.5*  --   --  16.6* 16.9*  LYMPHSABS 3.9 0.8 3.1  --   --  3.1 0.8  MONOABS 1.9* 0.1 1.5*  --   --  1.3* 0.4  EOSABS 0.4 0.0 0.0  --   --  0.2 0.2  BASOSABS 0.1 0.0 0.1  --   --  0.1 0.0    Chemistries  Recent Labs  Lab 07/04/19 0430 07/05/19 0300 07/06/19 0440 07/07/19 0318 07/08/19 0227  NA 132* 132* 131* 134* 134*  K 4.2 4.3 4.0 4.0 4.0  CL 100 99 99 103 102  CO2 22 20* _0 GLUCOSE 164* 240* 158* 97 186*  BUN 72* 74* 87* 81* 73*  CREATININE 2.42* 2.09* 1.93* 1.80* 1.61*  CALCIUM 8.0* 7.9* 7.7* 7.9* 7.8*  MG  --  1.9 1.9 1.8 1.7  AST 17 13* _1 ALT _2 ALKPHOS 122 135* 129* 142* 148*  BILITOT 0.5 0.3 0.2* 0.2* 0.3   ------------------------------------------------------------------------------------------------------------------ No results for input(s): CHOL, HDL, LDLCALC, TRIG, CHOLHDL, LDLDIRECT in the last 72 hours.  Lab Results  Component Value Date   HGBA1C 8.9 (H) 05/24/2019   ------------------------------------------------------------------------------------------------------------------ Recent Labs    07/07/19 0318  TSH 1.011    Cardiac Enzymes No results for input(s): CKMB, TROPONINI, MYOGLOBIN in the last 168 hours.  Invalid input(s): CK ------------------------------------------------------------------------------------------------------------------    Component Value Date/Time   BNP 72.7 07/08/2019 0227    Micro Results Recent Results (from the past 240 hour(s))  C Difficile Quick Screen w PCR reflex     Status: Abnormal   Collection Time: 07/04/19  4:54 AM   Specimen: Nasopharyngeal Swab; Stool  Result Value Ref Range Status   C Diff antigen POSITIVE (A) NEGATIVE Final   C Diff toxin NEGATIVE NEGATIVE Final   C Diff interpretation Results are indeterminate. See PCR results.  Final    Comment: Performed at [facility], [place]  [address] [place], [place] [address]  SARS Coronavirus 2 [employer] order, Performed in [facility] hospital lab) Nasopharyngeal Nasopharyngeal Swab     Status: Abnormal   Collection Time: 07/04/19  4:54 AM   Specimen: Nasopharyngeal Swab  Result Value Ref Range Status   SARS Coronavirus 2 POSITIVE (A) NEGATIVE Final    Comment: RESULT CALLED TO, READ BACK BY AND VERIFIED WITH: A. DENNIS,RN [id number] 07/04/2019 T. TYSOR (NOTE) If result is NEGATIVE SARS-CoV-2 target nucleic acids are NOT DETECTED. The SARS-CoV-2 RNA is generally detectable in upper and lower  respiratory specimens during the acute phase of infection. The lowest  concentration of SARS-CoV-2 viral copies this assay can detect is 250  copies / mL. A negative result does not preclude SARS-CoV-2 infection  and should not be used as the sole basis for treatment or other  patient management decisions.  A negative result may occur with  improper specimen collection / handling, submission of specimen other  than nasopharyngeal swab, presence of viral mutation(s) within the  areas targeted by this assay, and inadequate number of viral copies  (<250 copies / mL). A negative result must be combined with clinical  observations, patient history, and epidemiological information. If result is POSITIVE SARS-CoV-2 target nucleic acids are DETECTED.  The SARS-CoV-2 RNA is generally detectable in upper and lower  respiratory specimens during the acute phase of infection.  Positive  results are indicative of active infection with SARS-CoV-2.  Clinical  correlation with patient history and other diagnostic information is  necessary to determine patient infection status.  Positive results do  not rule out bacterial infection or co-infection with other viruses. If result is PRESUMPTIVE POSTIVE SARS-CoV-2 nucleic acids MAY BE PRESENT.   A  presumptive positive result was obtained on the submitted specimen  and confirmed on repeat testing.  While 2019  novel coronavirus  (SARS-CoV-2) nucleic acids may be present in the submitted sample  additional confirmatory testing may be necessary for epidemiological  and / or clinical management purposes  to differentiate between  SARS-CoV-2 and other Sarbecovirus currently known to infect humans.  If clinically indicated additional testing with an alternate test  methodology [phone]  is advised. The SARS-CoV-2 RNA is generally  detectable in upper and lower respiratory specimens during the acute  phase of infection. The expected result is Negative. Fact Sheet for Patients:  StrictlyIdeas.no Fact Sheet for Healthcare Providers: BankingDealers.co.za This test is not yet approved or cleared by the [place] FDA and has been authorized for detection and/or diagnosis of SARS-CoV-2 by FDA under an Emergency Use Authorization (EUA).  This EUA will remain in effect (meaning this test can be used) for the duration of the COVID-19 declaration under Section 564(b)(1) of the Act, 21 U.S.C. section 360bbb-3(b)(1), unless the authorization is terminated or revoked sooner. Performed at [facility], [place] [address], [place], [place] [address]   C. Diff by PCR, Reflexed     Status: Abnormal   Collection Time: 07/04/19  4:54 AM  Result Value Ref Range Status   Toxigenic C. Difficile by PCR POSITIVE (A) NEGATIVE Final    Comment: Positive for toxigenic C. difficile with little to no toxin production. Only treat if clinical presentation suggests symptomatic illness. Performed at [facility], [place] [address], [place], [place] [address]   Blood culture (routine x 2)     Status: None (Preliminary result)   Collection Time: 07/04/19  5:20 AM   Specimen: BLOOD  Result Value Ref Range Status   Specimen Description BLOOD LEFT ANTECUBITAL  Final   Special Requests   Final    BOTTLES DRAWN AEROBIC AND ANAEROBIC Blood Culture results may not be optimal  due to an excessive volume of blood received in culture bottles   Culture   Final    NO GROWTH 4 DAYS Performed at [facility], [place] [address], [place], [place] [address]    Report Status PENDING  Incomplete  Blood culture (routine x 2)     Status: None (Preliminary result)   Collection Time: 07/04/19  5:24 AM   Specimen: BLOOD LEFT HAND  Result Value Ref Range Status   Specimen Description BLOOD LEFT HAND  Final   Special Requests   Final    BOTTLES DRAWN AEROBIC ONLY Blood Culture adequate volume   Culture   Final    NO GROWTH 4 DAYS Performed at [facility], [place] [address] [place], [place] [address]    Report Status PENDING  Incomplete  MRSA PCR Screening     Status: None   Collection Time: 07/07/19 10:22 AM   Specimen: Nasopharyngeal  Result Value Ref Range Status   MRSA by PCR NEGATIVE NEGATIVE Final    Comment:        The GeneXpert MRSA Assay (FDA approved for NASAL specimens only), is one component of a comprehensive MRSA colonization surveillance program. It is not intended to diagnose MRSA infection nor to guide or monitor treatment for MRSA infections. Performed at [facility], [place] [address], [place], [place] [address]     Radiology Reports Ct Head Wo Contrast  Result Date: 07/04/2019 CLINICAL DATA:  Mental status. Fall 2 days ago. Positive COVID-25 May 2019. EXAM:  CT HEAD WITHOUT CONTRAST TECHNIQUE: Contiguous axial images were obtained from the base of the skull through the vertex without intravenous contrast. COMPARISON:  None. FINDINGS: Brain: Ventricles, cisterns and other CSF spaces are normal. There is mild chronic ischemic microvascular disease present. There is no mass, mass effect, shift of midline structures or acute hemorrhage. No evidence of acute infarction. Vascular: No hyperdense vessel or unexpected calcification. Skull: Normal. Negative for fracture or focal lesion. Sinuses/Orbits: No acute finding. Other:  None. IMPRESSION: No acute findings. Mild chronic ischemic microvascular disease. Electronically Signed   By: Marin Olp M.D.   On: 07/04/2019 07:11   Dg Chest Port 1 View  Result Date: 07/04/2019 CLINICAL DATA:  Altered mental status and hypotension EXAM: PORTABLE CHEST 1 VIEW COMPARISON:  06/03/2019 FINDINGS: Mild cardiomegaly accentuated by low volumes. Otherwise normal mediastinal contours. There is no edema, consolidation, effusion, or pneumothorax. Postoperative proximal left humerus. IMPRESSION: No evidence of acute disease. Electronically Signed   By: Monte Fantasia M.D.   On: 07/04/2019 05:26

## 2019-07-10 DIAGNOSIS — G9349 Other encephalopathy: Secondary | ICD-10-CM | POA: Diagnosis not present

## 2019-07-10 DIAGNOSIS — U071 COVID-19: Secondary | ICD-10-CM | POA: Diagnosis not present

## 2019-07-10 LAB — CBC WITH DIFFERENTIAL/PLATELET
Abs Immature Granulocytes: 0.23 10*3/uL — ABNORMAL HIGH (ref 0.00–0.07)
Basophils Absolute: 0 10*3/uL (ref 0.0–0.1)
Basophils Relative: 0 %
Eosinophils Absolute: 0.2 10*3/uL (ref 0.0–0.5)
Eosinophils Relative: 1 %
HCT: 30.2 % — ABNORMAL LOW (ref 36.0–46.0)
Hemoglobin: 9 g/dL — ABNORMAL LOW (ref 12.0–15.0)
Immature Granulocytes: 2 %
Lymphocytes Relative: 25 %
Lymphs Abs: 3.3 10*3/uL (ref 0.7–4.0)
MCH: 27 pg (ref 26.0–34.0)
MCHC: 29.8 g/dL — ABNORMAL LOW (ref 30.0–36.0)
MCV: 90.7 fL (ref 80.0–100.0)
Monocytes Absolute: 1.2 10*3/uL — ABNORMAL HIGH (ref 0.1–1.0)
Monocytes Relative: 10 %
Neutro Abs: 8.1 10*3/uL — ABNORMAL HIGH (ref 1.7–7.7)
Neutrophils Relative %: 62 %
Platelets: 404 10*3/uL — ABNORMAL HIGH (ref 150–400)
RBC: 3.33 MIL/uL — ABNORMAL LOW (ref 3.87–5.11)
RDW: 17 % — ABNORMAL HIGH (ref 11.5–15.5)
WBC: 13 10*3/uL — ABNORMAL HIGH (ref 4.0–10.5)
nRBC: 0 % (ref 0.0–0.2)

## 2019-07-10 LAB — COMPREHENSIVE METABOLIC PANEL
ALT: 18 U/L (ref 0–44)
AST: 37 U/L (ref 15–41)
Albumin: 1.3 g/dL — ABNORMAL LOW (ref 3.5–5.0)
Alkaline Phosphatase: 198 U/L — ABNORMAL HIGH (ref 38–126)
Anion gap: 8 (ref 5–15)
BUN: 90 mg/dL — ABNORMAL HIGH (ref 8–23)
CO2: 23 mmol/L (ref 22–32)
Calcium: 7.3 mg/dL — ABNORMAL LOW (ref 8.9–10.3)
Chloride: 105 mmol/L (ref 98–111)
Creatinine, Ser: 2.09 mg/dL — ABNORMAL HIGH (ref 0.44–1.00)
GFR calc Af Amer: 27 mL/min — ABNORMAL LOW (ref >60)
GFR calc non Af Amer: 24 mL/min — ABNORMAL LOW (ref >60)
Glucose, Bld: 136 mg/dL — ABNORMAL HIGH (ref 70–99)
Potassium: 3.9 mmol/L (ref 3.5–5.1)
Sodium: 136 mmol/L (ref 135–145)
Total Bilirubin: 0.5 mg/dL (ref 0.3–1.2)
Total Protein: 4.8 g/dL — ABNORMAL LOW (ref 6.5–8.1)

## 2019-07-10 LAB — MAGNESIUM: Magnesium: 1.8 mg/dL (ref 1.7–2.4)

## 2019-07-10 LAB — BRAIN NATRIURETIC PEPTIDE: B Natriuretic Peptide: 85.3 pg/mL (ref 0.0–100.0)

## 2019-07-10 LAB — GLUCOSE, CAPILLARY
Glucose-Capillary: 111 mg/dL — ABNORMAL HIGH (ref 70–99)
Glucose-Capillary: 153 mg/dL — ABNORMAL HIGH (ref 70–99)
Glucose-Capillary: 127 mg/dL — ABNORMAL HIGH (ref 70–99)
Glucose-Capillary: 234 mg/dL — ABNORMAL HIGH (ref 70–99)

## 2019-07-10 MED ORDER — LACTATED RINGERS IV SOLN
INTRAVENOUS | Status: AC
  Administered 2019-07-10 – 2019-07-12 (×7): via INTRAVENOUS

## 2019-07-10 MED ORDER — MIDODRINE HCL 5 MG PO TABS
10.0000 mg | ORAL_TABLET | Freq: Three times a day (TID) | ORAL | Status: DC
  Administered 2019-07-10 – 2019-07-16 (×18): 10 mg via ORAL
  Filled 2019-07-10 (×18): qty 2

## 2019-07-10 MED ORDER — LACTATED RINGERS IV SOLN
INTRAVENOUS | Status: AC
  Administered 2019-07-10: 09:00:00 via INTRAVENOUS

## 2019-07-10 MED ORDER — MAGIC MOUTHWASH W/LIDOCAINE
10.0000 mL | Freq: Three times a day (TID) | ORAL | Status: DC | PRN
  Administered 2019-07-10 – 2019-07-26 (×10): 10 mL via ORAL
  Filled 2019-07-10 (×12): qty 10

## 2019-07-10 NOTE — Progress Notes (Signed)
PROGRESS NOTE                                                                                                                                                                                                             Patient Demographics:    Alice Kemp, is a 68 y.o. female, DOB - 1950-12-09, VPX:[id number]  Outpatient Primary MD for the patient is [name], MD    LOS - 6  Admit date - 07/04/2019    Chief Complaint  Patient presents with  . Fall       Brief Narrative  Alice Kemp is a 68 y.o. female with medical history significant of hypothyroidism; OSA; morbid obesity (BMI 44); HLD; DM; diastolic CHF; and stage 3 CKD presenting with a fall.    She was admitted to the hospital on 05/24/2019 for a spinal stenosis status post lumbar laminectomy, she was discharged to the SNF on 824.  She also was found to have COVID-19 infection which was incidental finding during that hospitalization.  She tested negative on 06/04/2019.    Patient now was residing at [facility], she reportedly had a fall on 07/02/2019, she then came to the ER and was discharged after a stable evaluation back to SNF.  Subsequently on 07/03/2019 she developed profuse diarrhea and was brought in with severe dehydration and AKI and admitted to the hospital on 07/04/2019.   Subjective:   Patient in bed, appears comfortable, denies any headache, no fever, no chest pain or pressure, no shortness of breath , no abdominal pain. No focal weakness.   Assessment  & Plan :     1. COVID-19 infection during the ongoing 2020 Covid 19 Pandemic - her initial incidental positive was 05/24/2019, she was asymptomatic from this standpoint, she likely had a false negative test on 06/04/2019.  She is positive again on 07/04/2019 again incidentally while her main symptoms are C. difficile induced diarrhea.  She has no pulmonary symptoms and clinically appears stable from  COVID-19 infection standpoint.   COVID-19 Labs  Recent Labs    07/08/19 0227  DDIMER 3.01*    Lab Results  Component Value Date   SARSCOV2NAA POSITIVE (A) 07/04/2019   SARSCOV2NAA NOT DETECTED 06/04/2019   SARSCOV2NAA POSITIVE (A) 05/24/2019     Hepatic Function Latest Ref Rng & Units 07/10/2019 07/08/2019  07/07/2019  Total Protein 6.5 - 8.1 g/dL 4.8(L) 5.4(L) 5.5(L)  Albumin 3.5 - 5.0 g/dL 1.3(L) 1.6(L) 1.6(L)  AST 15 - 41 U/L 37 18 19  ALT 0 - 44 U/L 18 13 12  Alk Phosphatase 38 - 126 U/L 198(H) 148(H) 142(H)  Total Bilirubin 0.3 - 1.2 mg/dL 0.5 0.3 0.2(L)  Bilirubin, Direct 0.0 - 0.3 mg/dL - - -        Component Value Date/Time   BNP 85.3 07/10/2019 0414      2.  Severe C. difficile colitis with diarrhea causing dehydration and AKI.  Placed on high dose vancomycin+ fidaxomicin added on 07/07/2019. Still having ++ diarrhea, Continue IVF.  3.  History of depression.  Continue combination of amitriptyline, Wellbutrin and Cymbalta.  4.  Morbid obesity.  BMI 44.  Follow with PCP for weight loss.  5.  Hypothyroidism.  Continue home dose Synthroid.    6.  Hypertension.  Blood pressure was low due to dehydration, hold blood pressure medications.  Continue IV fluids.  7.  Recent L-spine spinal stenosis surgery last month on 05/30/2019 by Dr. Cram.  Supportive care.   8.  Dysphagia.  Speech following currently on dysphagia 1 diet, oral intake is poor added pro-stat as well.    9. DM type II.  On Lantus and sliding scale will monitor and adjust.  Poor outpatient control due to hyperglycemia with A1c of 8.9 on 05/24/2019.  CBG (last 3)  Recent Labs    07/09/19 1658 07/09/19 2120 07/10/19 0816  GLUCAP 201* 200* 111*    Lab Results  Component Value Date   HGBA1C 8.9 (H) 05/24/2019     Condition - Extremely Guarded, she is DNR and already has a most form from her home which suggest that she has poor baseline to begin with.  Her only listed family member is her mother.   Family Communication  : Mother on & brother Forest Renk [phone]  on 07/08/19 - they understands poor prognosis and poor baseline.  Code Status : DNR  Diet :    Diet Order            Diet Carb Modified Fluid consistency: Thin; Room service appropriate? Yes  Diet effective now               Disposition Plan  : Inpatient, may require SNF.  Consults  : None  Procedures  :     PUD Prophylaxis :  PPI DC'd  DVT Prophylaxis  :  Lovenox    Lab Results  Component Value Date   PLT 404 (H) 07/10/2019    Inpatient Medications  Scheduled Meds: . aspirin  81 mg Oral Daily  . buPROPion  200 mg Oral Daily  . DULoxetine  30 mg Oral BID  . enoxaparin (LOVENOX) injection  40 mg Subcutaneous Q24H  . feeding supplement (PRO-STAT SUGAR FREE 64)  30 mL Oral TID WC  . gabapentin  300 mg Oral TID  . insulin aspart  0-15 Units Subcutaneous TID WC  . insulin aspart  0-5 Units Subcutaneous QHS  . insulin glargine  10 Units Subcutaneous Daily  . levothyroxine  88 mcg Oral QAC breakfast  . rosuvastatin  5 mg Oral Daily  . vancomycin  500 mg Oral Q6H   Continuous Infusions: . sodium chloride    . lactated ringers 125 mL/hr at 07/10/19 0849   PRN Meds:.sodium chloride, acetaminophen, [DISCONTINUED] ondansetron **OR** ondansetron (ZOFRAN) IV, sodium chloride flush  Antibiotics  :      Anti-infectives (From admission, onward)   Start     Dose/Rate Route Frequency Ordered Stop   07/09/19 2130  vancomycin (VANCOCIN) 50 mg/mL oral solution 500 mg  Status:  Discontinued     500 mg Oral Every 6 hours 07/08/19 2104 07/08/19 2146   07/08/19 2200  vancomycin (VANCOCIN) 50 mg/mL oral solution 500 mg     500 mg Oral Every 6 hours 07/08/19 2146     07/05/19 1600  remdesivir 100 mg in sodium chloride 0.9 % 250 mL IVPB  Status:  Discontinued     100 mg 500 mL/hr over 30 Minutes Intravenous Every 24 hours 07/04/19 1517 07/05/19 0734   07/04/19 1600  remdesivir 200 mg in sodium chloride 0.9 % 250 mL  IVPB     200 mg 500 mL/hr over 30 Minutes Intravenous Once 07/04/19 1517 07/04/19 1941   07/04/19 1400  vancomycin (VANCOCIN) 50 mg/mL oral solution 500 mg  Status:  Discontinued     500 mg Oral Every 6 hours 07/04/19 1134 07/08/19 2104       Time Spent in minutes  30   Prashant Singh M.D on 07/10/2019 at 9:26 AM  To page go to [email or web address] - password [email or web address]  [facility] -  Office  [phone]  See all Orders from today for further details    Objective:   Vitals:   07/09/19 1900 07/10/19 0100 07/10/19 0400 07/10/19 0800  BP: (!) 113/59 (!) 90/55 94/74 (!) 95/52  Pulse: 98 89 92 90  Resp:    20  Temp: 98.6 F (37 C) 98.1 F (36.7 C) 98.7 F (37.1 C) 97.6 F (36.4 C)  TempSrc: Oral Oral Oral Oral  SpO2: 98% 95% 97% 98%  Weight:      Height:        Wt Readings from Last 3 Encounters:  07/04/19 117 kg  06/05/19 117.8 kg  12/14/18 112.5 kg     Intake/Output Summary (Last 24 hours) at 07/10/2019 0926 Last data filed at 07/10/2019 0634 Gross per 24 hour  Intake 2253.33 ml  Output -  Net 2253.33 ml     Physical Exam  Awake looks better today, no FN deficits, flat affect, rectal tube in place [place].AT,PERRAL Supple Neck,No JVD, No cervical lymphadenopathy appriciated.  Symmetrical Chest wall movement, Good air movement bilaterally, CTAB RRR,No Gallops, Rubs or new Murmurs, No Parasternal Heave +ve B.Sounds, Abd Soft, No tenderness, No organomegaly appriciated, No rebound - guarding or rigidity. No Cyanosis, Clubbing or edema, No new Rash or bruise    Data Review:    CBC Recent Labs  Lab 07/05/19 0300 07/06/19 0440 07/06/19 1622 07/06/19 2120 07/07/19 0318 07/08/19 0227 07/10/19 0414  WBC 12.1* 12.7*  --   --  13.9* 10.3 13.0*  HGB 9.6* 9.4* 9.8* 9.7* 9.6* 10.1* 9.0*  HCT 31.7* 30.8* 32.0* 31.3* 31.2* 33.0* 30.2*  PLT 411* 464*  --   --  444* 426* 404*  MCV 90.8 89.8  --   --  90.4 90.2 90.7  MCH 27.5 27.4  --   --  27.8 27.6 27.0  MCHC 30.3  30.5  --   --  30.8 30.6 29.8*  RDW 16.3* 16.5*  --   --  16.6* 16.9* 17.0*  LYMPHSABS 0.8 3.1  --   --  3.1 0.8 3.3  MONOABS 0.1 1.5*  --   --  1.3* 0.4 1.2*  EOSABS 0.0 0.0  --   --  0.2 0.2 0.2  BASOSABS 0.0 0.1  --   --    0.1 0.0 0.0    Chemistries  Recent Labs  Lab 07/05/19 0300 07/06/19 0440 07/07/19 0318 07/08/19 0227 07/10/19 0414  NA 132* 131* 134* 134* 136  K 4.3 4.0 4.0 4.0 3.9  CL 99 99 103 102 105  CO2 20* _0 GLUCOSE 240* 158* 97 186* 136*  BUN 74* 87* 81* 73* 90*  CREATININE 2.09* 1.93* 1.80* 1.61* 2.09*  CALCIUM 7.9* 7.7* 7.9* 7.8* 7.3*  MG 1.9 1.9 1.8 1.7 1.8  AST 13* _1 37  ALT _2 ALKPHOS 135* 129* 142* 148* 198*  BILITOT 0.3 0.2* 0.2* 0.3 0.5   ------------------------------------------------------------------------------------------------------------------ No results for input(s): CHOL, HDL, LDLCALC, TRIG, CHOLHDL, LDLDIRECT in the last 72 hours.  Lab Results  Component Value Date   HGBA1C 8.9 (H) 05/24/2019   ------------------------------------------------------------------------------------------------------------------ No results for input(s): TSH, T4TOTAL, T3FREE, THYROIDAB in the last 72 hours.  Invalid input(s): FREET3  Cardiac Enzymes No results for input(s): CKMB, TROPONINI, MYOGLOBIN in the last 168 hours.  Invalid input(s): CK ------------------------------------------------------------------------------------------------------------------    Component Value Date/Time   BNP 85.3 07/10/2019 0414    Micro Results Recent Results (from the past 240 hour(s))  C Difficile Quick Screen w PCR reflex     Status: Abnormal   Collection Time: 07/04/19  4:54 AM   Specimen: Nasopharyngeal Swab; Stool  Result Value Ref Range Status   C Diff antigen POSITIVE (A) NEGATIVE Final   C Diff toxin NEGATIVE NEGATIVE Final   C Diff interpretation Results are indeterminate. See PCR results.  Final    Comment: Performed at  [facility], [place] [address], [place], [place] [address]  SARS Coronavirus 2 [facility] order, Performed in [facility] hospital lab) Nasopharyngeal Nasopharyngeal Swab     Status: Abnormal   Collection Time: 07/04/19  4:54 AM   Specimen: Nasopharyngeal Swab  Result Value Ref Range Status   SARS Coronavirus 2 POSITIVE (A) NEGATIVE Final    Comment: RESULT CALLED TO, READ BACK BY AND VERIFIED WITH: A. DENNIS,RN 1308 07/04/2019 T. TYSOR (NOTE) If result is NEGATIVE SARS-CoV-2 target nucleic acids are NOT DETECTED. The SARS-CoV-2 RNA is generally detectable in upper and lower  respiratory specimens during the acute phase of infection. The lowest  concentration of SARS-CoV-2 viral copies this assay can detect is 250  copies / mL. A negative result does not preclude SARS-CoV-2 infection  and should not be used as the sole basis for treatment or other  patient management decisions.  A negative result may occur with  improper specimen collection / handling, submission of specimen other  than nasopharyngeal swab, presence of viral mutation(s) within the  areas targeted by this assay, and inadequate number of viral copies  (<250 copies / mL). A negative result must be combined with clinical  observations, patient history, and epidemiological information. If result is POSITIVE SARS-CoV-2 target nucleic acids are DETECTED.  The SARS-CoV-2 RNA is generally detectable in upper and lower  respiratory specimens during the acute phase of infection.  Positive  results are indicative of active infection with SARS-CoV-2.  Clinical  correlation with patient history and other diagnostic information is  necessary to determine patient infection status.  Positive results do  not rule out bacterial infection or co-infection with other viruses. If result is PRESUMPTIVE POSTIVE SARS-CoV-2 nucleic acids MAY BE PRESENT.   A presumptive positive result was obtained on the submitted specimen  and confirmed  on repeat testing.  While 2019  novel coronavirus  (SARS-CoV-2) nucleic acids may be present in the submitted sample  additional confirmatory testing may be necessary for epidemiological  and / or clinical management purposes  to differentiate between  SARS-CoV-2 and other Sarbecovirus currently known to infect humans.  If clinically indicated additional testing with an alternate test  methodology [phone])  is advised. The SARS-CoV-2 RNA is generally  detectable in upper and lower respiratory specimens during the acute  phase of infection. The expected result is Negative. Fact Sheet for Patients:  StrictlyIdeas.no Fact Sheet for Healthcare Providers: BankingDealers.co.za This test is not yet approved or cleared by the [place] FDA and has been authorized for detection and/or diagnosis of SARS-CoV-2 by FDA under an Emergency Use Authorization (EUA).  This EUA will remain in effect (meaning this test can be used) for the duration of the COVID-19 declaration under Section 564(b)(1) of the Act, 21 U.S.C. section 360bbb-3(b)(1), unless the authorization is terminated or revoked sooner. Performed at [facility], [place] [address], [place], [place] [address]   C. Diff by PCR, Reflexed     Status: Abnormal   Collection Time: 07/04/19  4:54 AM  Result Value Ref Range Status   Toxigenic C. Difficile by PCR POSITIVE (A) NEGATIVE Final    Comment: Positive for toxigenic C. difficile with little to no toxin production. Only treat if clinical presentation suggests symptomatic illness. Performed at [facility], [place] [address], [place], [place] [address]   Blood culture (routine x 2)     Status: None   Collection Time: 07/04/19  5:20 AM   Specimen: BLOOD  Result Value Ref Range Status   Specimen Description BLOOD LEFT ANTECUBITAL  Final   Special Requests   Final    BOTTLES DRAWN AEROBIC AND ANAEROBIC Blood Culture results may not be  optimal due to an excessive volume of blood received in culture bottles   Culture   Final    NO GROWTH 5 DAYS Performed at [facility], [place] [address], [place], [place] [address]    Report Status 07/09/2019 FINAL  Final  Blood culture (routine x 2)     Status: None   Collection Time: 07/04/19  5:24 AM   Specimen: BLOOD LEFT HAND  Result Value Ref Range Status   Specimen Description BLOOD LEFT HAND  Final   Special Requests   Final    BOTTLES DRAWN AEROBIC ONLY Blood Culture adequate volume   Culture   Final    NO GROWTH 5 DAYS Performed at [facility], [place] [address] [place], [place] [address]    Report Status 07/09/2019 FINAL  Final  MRSA PCR Screening     Status: None   Collection Time: 07/07/19 10:22 AM   Specimen: Nasopharyngeal  Result Value Ref Range Status   MRSA by PCR NEGATIVE NEGATIVE Final    Comment:        The GeneXpert MRSA Assay (FDA approved for NASAL specimens only), is one component of a comprehensive MRSA colonization surveillance program. It is not intended to diagnose MRSA infection nor to guide or monitor treatment for MRSA infections. Performed at [facility], [place] [address], [place], [place] [address]     Radiology Reports Ct Head Wo Contrast  Result Date: 07/04/2019 CLINICAL DATA:  Mental status. Fall 2 days ago. Positive COVID-25 May 2019. EXAM: CT HEAD WITHOUT CONTRAST TECHNIQUE: Contiguous axial images were obtained from the base of the skull through the vertex without intravenous  contrast. COMPARISON:  None. FINDINGS: Brain: Ventricles, cisterns and other CSF spaces are normal. There is mild chronic ischemic microvascular disease present. There is no mass, mass effect, shift of midline structures or acute hemorrhage. No evidence of acute infarction. Vascular: No hyperdense vessel or unexpected calcification. Skull: Normal. Negative for fracture or focal lesion. Sinuses/Orbits: No acute finding. Other: None.  IMPRESSION: No acute findings. Mild chronic ischemic microvascular disease. Electronically Signed   By: Daniel  Boyle M.D.   On: 07/04/2019 07:11   Dg Chest Port 1 View  Result Date: 07/04/2019 CLINICAL DATA:  Altered mental status and hypotension EXAM: PORTABLE CHEST 1 VIEW COMPARISON:  06/03/2019 FINDINGS: Mild cardiomegaly accentuated by low volumes. Otherwise normal mediastinal contours. There is no edema, consolidation, effusion, or pneumothorax. Postoperative proximal left humerus. IMPRESSION: No evidence of acute disease. Electronically Signed   By: Jonathon  Watts M.D.   On: 07/04/2019 05:26     

## 2019-07-10 NOTE — Progress Notes (Signed)
Alice Kemp, mother, called and given update on patient.  Given a mobile number where the mother can hear better.  Called mother back on cell number and given update on patient.  Appreciative of phone call and update.

## 2019-07-11 ENCOUNTER — Inpatient Hospital Stay: Payer: Self-pay

## 2019-07-11 ENCOUNTER — Inpatient Hospital Stay (HOSPITAL_COMMUNITY): Payer: PPO

## 2019-07-11 DIAGNOSIS — G9349 Other encephalopathy: Secondary | ICD-10-CM | POA: Diagnosis not present

## 2019-07-11 DIAGNOSIS — U071 COVID-19: Secondary | ICD-10-CM | POA: Diagnosis not present

## 2019-07-11 DIAGNOSIS — L8992 Pressure ulcer of unspecified site, stage 2: Secondary | ICD-10-CM

## 2019-07-11 LAB — COMPREHENSIVE METABOLIC PANEL
ALT: 22 U/L (ref 0–44)
AST: 54 U/L — ABNORMAL HIGH (ref 15–41)
Albumin: 1.2 g/dL — ABNORMAL LOW (ref 3.5–5.0)
Alkaline Phosphatase: 292 U/L — ABNORMAL HIGH (ref 38–126)
Anion gap: 9 (ref 5–15)
BUN: 80 mg/dL — ABNORMAL HIGH (ref 8–23)
CO2: 21 mmol/L — ABNORMAL LOW (ref 22–32)
Calcium: 7.3 mg/dL — ABNORMAL LOW (ref 8.9–10.3)
Chloride: 102 mmol/L (ref 98–111)
Creatinine, Ser: 1.72 mg/dL — ABNORMAL HIGH (ref 0.44–1.00)
GFR calc Af Amer: 35 mL/min — ABNORMAL LOW (ref >60)
GFR calc non Af Amer: 30 mL/min — ABNORMAL LOW (ref >60)
Glucose, Bld: 149 mg/dL — ABNORMAL HIGH (ref 70–99)
Potassium: 3.7 mmol/L (ref 3.5–5.1)
Sodium: 132 mmol/L — ABNORMAL LOW (ref 135–145)
Total Bilirubin: 0.3 mg/dL (ref 0.3–1.2)
Total Protein: 4.6 g/dL — ABNORMAL LOW (ref 6.5–8.1)

## 2019-07-11 LAB — BRAIN NATRIURETIC PEPTIDE: B Natriuretic Peptide: 90.4 pg/mL (ref 0.0–100.0)

## 2019-07-11 LAB — GI PATHOGEN PANEL BY PCR, STOOL

## 2019-07-11 LAB — GLUCOSE, CAPILLARY
Glucose-Capillary: 208 mg/dL — ABNORMAL HIGH (ref 70–99)
Glucose-Capillary: 125 mg/dL — ABNORMAL HIGH (ref 70–99)
Glucose-Capillary: 145 mg/dL — ABNORMAL HIGH (ref 70–99)

## 2019-07-11 LAB — CBC WITH DIFFERENTIAL/PLATELET
Abs Immature Granulocytes: 0.15 10*3/uL — ABNORMAL HIGH (ref 0.00–0.07)
Basophils Absolute: 0 10*3/uL (ref 0.0–0.1)
Basophils Relative: 0 %
Eosinophils Absolute: 0.2 10*3/uL (ref 0.0–0.5)
Eosinophils Relative: 2 %
HCT: 29.1 % — ABNORMAL LOW (ref 36.0–46.0)
Hemoglobin: 8.8 g/dL — ABNORMAL LOW (ref 12.0–15.0)
Immature Granulocytes: 2 %
Lymphocytes Relative: 27 %
Lymphs Abs: 2.6 10*3/uL (ref 0.7–4.0)
MCH: 27.2 pg (ref 26.0–34.0)
MCHC: 30.2 g/dL (ref 30.0–36.0)
MCV: 90.1 fL (ref 80.0–100.0)
Monocytes Absolute: 0.7 10*3/uL (ref 0.1–1.0)
Monocytes Relative: 7 %
Neutro Abs: 6.2 10*3/uL (ref 1.7–7.7)
Neutrophils Relative %: 62 %
Platelets: 364 10*3/uL (ref 150–400)
RBC: 3.23 MIL/uL — ABNORMAL LOW (ref 3.87–5.11)
RDW: 16.9 % — ABNORMAL HIGH (ref 11.5–15.5)
WBC: 9.9 10*3/uL (ref 4.0–10.5)
nRBC: 0 % (ref 0.0–0.2)

## 2019-07-11 LAB — TSH: TSH: 1.49 u[IU]/mL (ref 0.350–4.500)

## 2019-07-11 LAB — MAGNESIUM: Magnesium: 1.7 mg/dL (ref 1.7–2.4)

## 2019-07-11 LAB — CORTISOL: Cortisol, Plasma: 15.7 ug/dL

## 2019-07-11 LAB — T4, FREE: Free T4: 1.2 ng/dL — ABNORMAL HIGH (ref 0.61–1.12)

## 2019-07-11 MED ORDER — SODIUM CHLORIDE 0.9% FLUSH: 10.0000 mL | INTRAVENOUS | Status: DC | PRN

## 2019-07-11 MED ORDER — CHOLESTYRAMINE LIGHT 4 G PO PACK
4.0000 g | PACK | Freq: Two times a day (BID) | ORAL | Status: AC
  Administered 2019-07-11 (×2): 4 g via ORAL
  Filled 2019-07-11 (×2): qty 1

## 2019-07-11 MED ORDER — BUPROPION HCL ER (SR) 100 MG PO TB12
100.0000 mg | ORAL_TABLET | Freq: Two times a day (BID) | ORAL | Status: DC
Start: 2019-07-12 — End: 2019-08-05
  Administered 2019-07-12 – 2019-08-01 (×37): 100 mg via ORAL
  Filled 2019-07-11 (×53): qty 1

## 2019-07-11 MED ORDER — CHLORHEXIDINE GLUCONATE CLOTH 2 % EX PADS
6.0000 | MEDICATED_PAD | Freq: Every day | CUTANEOUS | Status: DC
  Administered 2019-07-11 – 2019-08-04 (×22): 6 via TOPICAL

## 2019-07-11 MED ORDER — METRONIDAZOLE IN NACL 5-0.79 MG/ML-% IV SOLN
500.0000 mg | Freq: Three times a day (TID) | INTRAVENOUS | Status: DC
  Administered 2019-07-11: 500 mg via INTRAVENOUS
  Filled 2019-07-11 (×2): qty 100

## 2019-07-11 MED ORDER — LACTATED RINGERS IV SOLN
INTRAVENOUS | Status: DC
  Administered 2019-07-11: 10:00:00 via INTRAVENOUS

## 2019-07-11 MED ORDER — SODIUM CHLORIDE 0.9% FLUSH
10.0000 mL | Freq: Two times a day (BID) | INTRAVENOUS | Status: DC
  Administered 2019-07-11 – 2019-07-12 (×2): 10 mL
  Administered 2019-07-12: 10:00:00 30 mL
  Administered 2019-07-13 – 2019-08-04 (×34): 10 mL

## 2019-07-11 MED ORDER — MAGNESIUM SULFATE IN D5W 1-5 GM/100ML-% IV SOLN
1.0000 g | Freq: Once | INTRAVENOUS | Status: AC
  Administered 2019-07-11: 19:00:00 1 g via INTRAVENOUS
  Filled 2019-07-11: qty 100

## 2019-07-11 MED ORDER — POTASSIUM CHLORIDE CRYS ER 20 MEQ PO TBCR
40.0000 meq | EXTENDED_RELEASE_TABLET | Freq: Once | ORAL | Status: AC
  Administered 2019-07-11: 40 meq via ORAL
  Filled 2019-07-11: qty 2

## 2019-07-11 MED ORDER — METRONIDAZOLE IN NACL 5-0.79 MG/ML-% IV SOLN
500.0000 mg | Freq: Three times a day (TID) | INTRAVENOUS | Status: DC
  Administered 2019-07-12 – 2019-07-13 (×4): 500 mg via INTRAVENOUS
  Filled 2019-07-11 (×6): qty 100

## 2019-07-11 MED ORDER — GERHARDT'S BUTT CREAM
1.0000 "application " | TOPICAL_CREAM | Freq: Three times a day (TID) | CUTANEOUS | Status: DC
  Administered 2019-07-11 – 2019-08-05 (×68): 1 via TOPICAL
  Filled 2019-07-11 (×4): qty 1

## 2019-07-11 NOTE — Progress Notes (Signed)
Peripherally Inserted Central Catheter/Midline Placement  The IV Nurse has discussed with the patient and/or persons authorized to consent for the patient, the purpose of this procedure and the potential benefits and risks involved with this procedure.  The benefits include less needle sticks, lab draws from the catheter, and the patient may be discharged home with the catheter. Risks include, but not limited to, infection, bleeding, blood clot (thrombus formation), and puncture of an artery; nerve damage and irregular heartbeat and possibility to perform a PICC exchange if needed/ordered by physician.  Alternatives to this procedure were also discussed.  Bard Power PICC patient education guide, fact sheet on infection prevention and patient information card has been provided to patient /or left at bedside.    PICC/Midline Placement Documentation  PICC Double Lumen 07/11/19 PICC Right Cephalic 39 cm 0 cm (Active)  Indication for Insertion or Continuance of Line Vasoactive infusions;Poor Vasculature-patient has had multiple peripheral attempts or PIVs lasting less than 24 hours 07/11/19 1700  Exposed Catheter (cm) 0 cm 07/11/19 1700  Site Assessment Clean;Dry;Intact 07/11/19 1700  Lumen #1 Status Flushed;[place] locked;Blood return noted 07/11/19 1700  Lumen #2 Status Flushed;Saline locked;Blood return noted 07/11/19 1700  Dressing Type Transparent;Securing device 07/11/19 1700  Dressing Status Clean;Dry;Intact;Antimicrobial disc in place 07/11/19 [place] checked and tightened 07/11/19 1700  Dressing Intervention New dressing;Other (Comment) 07/11/19 1700  Dressing Change Due 07/18/19 07/11/19 1700   Patient gave written consent.   Alice Kemp 07/11/2019, 5:06 PM

## 2019-07-11 NOTE — Progress Notes (Signed)
PROGRESS NOTE                                                                                                                                                                                                             Patient Demographics:    Alice Kemp, is a 68 y.o. female, DOB - 1951-09-07, QIH:[id number]  Outpatient Primary MD for the patient is [name], MD    LOS - 7  Admit date - 07/04/2019    Chief Complaint  Patient presents with  . Fall       Brief Narrative  Alice Kemp is a 68 y.o. female with medical history significant of hypothyroidism; OSA; morbid obesity (BMI 44); HLD; DM; diastolic CHF; and stage 3 CKD presenting with a fall.    She was admitted to the hospital on 05/24/2019 for a spinal stenosis status post lumbar laminectomy, she was discharged to the SNF on 824.  She also was found to have COVID-19 infection which was incidental finding during that hospitalization.  She tested negative on 06/04/2019.    Patient now was residing at [facility], she reportedly had a fall on 07/02/2019, she then came to the ER and was discharged after a stable evaluation back to SNF.  Subsequently on 07/03/2019 she developed profuse diarrhea and was brought in with severe dehydration and AKI and admitted to the hospital on 07/04/2019.   Subjective:   Patient in bed, appears comfortable, denies any headache, no fever, no chest pain or pressure, no shortness of breath , no abdominal pain. No focal weakness.    Assessment  & Plan :     1. COVID-19 infection during the ongoing 2020 Covid 19 Pandemic - her initial incidental positive was 05/24/2019, she was asymptomatic from this standpoint, she likely had a false negative test on 06/04/2019.  She is positive again on 07/04/2019 again incidentally while her main symptoms are C. difficile induced diarrhea.  She has no pulmonary symptoms and clinically appears stable from  COVID-19 infection standpoint.   COVID-19 Labs  No results for input(s): DDIMER, FERRITIN, LDH, CRP in the last 72 hours.  Lab Results  Component Value Date   SARSCOV2NAA POSITIVE (A) 07/04/2019   SARSCOV2NAA NOT DETECTED 06/04/2019   SARSCOV2NAA POSITIVE (A) 05/24/2019     Hepatic Function Latest Ref Rng & Units  07/11/2019 07/10/2019 07/08/2019  Total Protein 6.5 - 8.1 g/dL 4.6(L) 4.8(L) 5.4(L)  Albumin 3.5 - 5.0 g/dL 1.2(L) 1.3(L) 1.6(L)  AST 15 - 41 U/L 54(H) 37 18  ALT 0 - 44 U/L _0 Alk Phosphatase 38 - 126 U/L 292(H) 198(H) 148(H)  Total Bilirubin 0.3 - 1.2 mg/dL 0.3 0.5 0.3  Bilirubin, Direct 0.0 - 0.3 mg/dL - - -        Component Value Date/Time   BNP 90.4 07/11/2019 0420      2.  Severe C. difficile colitis with diarrhea causing dehydration and AKI.  Placed on high dose vancomycin+ fidaxomicin added on 07/07/2019. Still having ++ diarrhea, Continue IVF.  Discussed her case with ID physician Dr. Linus Salmons and GI physician Dr. Lyndel Safe consensus is to add IV Flagyl on 07/11/2019 along with few doses of cholestyramine to try and slow down her diarrhea.  Will follow KUB and abdominal exam clinically.  3.  History of depression.  Continue combination of amitriptyline, Wellbutrin and Cymbalta.  4.  Morbid obesity.  BMI 44.  Follow with PCP for weight loss.  5.  Hypothyroidism.  Continue home dose Synthroid.    6.  Persistent hypotension.  Due to GI loss.  Continue to give IV fluid bolus and IV fluid maintenance, TSH is stable we will also check random a.m. cortisol, try to slow down diarrhea as above.  7.  Recent L-spine spinal stenosis surgery last month on 05/30/2019 by Dr. Saintclair Halsted.  Supportive care.   8.  Dysphagia.  Speech following currently on dysphagia 1 diet, oral intake is poor added pro-stat as well.    9. DM type II.  On Lantus and sliding scale will monitor and adjust.  Poor outpatient control due to hyperglycemia with A1c of 8.9 on 05/24/2019.  CBG (last 3)  Recent  Labs    07/10/19 1644 07/10/19 2206 07/11/19 0813  GLUCAP 127* 234* 125*    Lab Results  Component Value Date   HGBA1C 8.9 (H) 05/24/2019     Condition - Extremely Guarded, she is DNR and already has a most form from her home which suggest that she has poor baseline to begin with.  Her only listed family member is her mother.  Family Communication  : Mother on & brother [facility] [phone]  on 07/08/19 - they understands poor prognosis and poor baseline.  Code Status : DNR  Diet :    Diet Order            Diet regular Room service appropriate? Yes; Fluid consistency: Thin  Diet effective now               Disposition Plan  : Inpatient, may require SNF.  Consults  : None  Procedures  :     PUD Prophylaxis :  PPI DC'd  DVT Prophylaxis  :  Lovenox    Lab Results  Component Value Date   PLT 364 07/11/2019    Inpatient Medications  Scheduled Meds: . aspirin  81 mg Oral Daily  . buPROPion  200 mg Oral Daily  . cholestyramine light  4 g Oral BID  . DULoxetine  30 mg Oral BID  . enoxaparin (LOVENOX) injection  40 mg Subcutaneous Q24H  . feeding supplement (PRO-STAT SUGAR FREE 64)  30 mL Oral TID WC  . gabapentin  300 mg Oral TID  . Gerhardt's butt cream  1 application Topical TID  . insulin aspart  0-15 Units Subcutaneous TID WC  .  insulin aspart  0-5 Units Subcutaneous QHS  . insulin glargine  10 Units Subcutaneous Daily  . levothyroxine  88 mcg Oral QAC breakfast  . midodrine  10 mg Oral TID WC  . rosuvastatin  5 mg Oral Daily  . vancomycin  500 mg Oral Q6H   Continuous Infusions: . sodium chloride    . lactated ringers 125 mL/hr at 07/11/19 1035  . metronidazole     PRN Meds:.sodium chloride, acetaminophen, magic mouthwash w/lidocaine, [DISCONTINUED] ondansetron **OR** ondansetron (ZOFRAN) IV, sodium chloride flush  Antibiotics  :    Anti-infectives (From admission, onward)   Start     Dose/Rate Route Frequency Ordered Stop   07/11/19 1100   metroNIDAZOLE (FLAGYL) IVPB 500 mg     500 mg 100 mL/hr over 60 Minutes Intravenous Every 8 hours 07/11/19 1055     07/09/19 2130  vancomycin (VANCOCIN) 50 mg/mL oral solution 500 mg  Status:  Discontinued     500 mg Oral Every 6 hours 07/08/19 2104 07/08/19 2146   07/08/19 2200  vancomycin (VANCOCIN) 50 mg/mL oral solution 500 mg     500 mg Oral Every 6 hours 07/08/19 2146     07/05/19 1600  remdesivir 100 mg in sodium chloride 0.9 % 250 mL IVPB  Status:  Discontinued     100 mg 500 mL/hr over 30 Minutes Intravenous Every 24 hours 07/04/19 1517 07/05/19 0734   07/04/19 1600  remdesivir 200 mg in sodium chloride 0.9 % 250 mL IVPB     200 mg 500 mL/hr over 30 Minutes Intravenous Once 07/04/19 1517 07/04/19 1941   07/04/19 1400  vancomycin (VANCOCIN) 50 mg/mL oral solution 500 mg  Status:  Discontinued     500 mg Oral Every 6 hours 07/04/19 1134 07/08/19 2104       Time Spent in minutes  30   Lala Lund M.D on 07/11/2019 at 10:58 AM  To page go to [email or web address] - password [facility]  [facility] -  Office  [phone]  See all Orders from today for further details    Objective:   Vitals:   07/11/19 0115 07/11/19 0400 07/11/19 0602 07/11/19 0815  BP:  (!) 85/59 (!) 85/60 (!) 95/47  Pulse:  88 89 (!) 106  Resp:      Temp: 98.1 F (36.7 C) 98.4 F (36.9 C)  97.6 F (36.4 C)  TempSrc: Oral Oral  Oral  SpO2:  98% 98% 98%  Weight:      Height:        Wt Readings from Last 3 Encounters:  07/04/19 117 kg  06/05/19 117.8 kg  12/14/18 112.5 kg     Intake/Output Summary (Last 24 hours) at 07/11/2019 1058 Last data filed at 07/11/2019 0400 Gross per 24 hour  Intake 2441.82 ml  Output -  Net 2441.82 ml     Physical Exam  Awake looks better today, no FN deficits, flat affect, pure wick in place, rectal tube in place [place].AT,PERRAL Supple Neck,No JVD, No cervical lymphadenopathy appriciated.  Symmetrical Chest wall movement, Good air movement bilaterally, CTAB  RRR,No Gallops, Rubs or new Murmurs, No Parasternal Heave +ve B.Sounds, Abd Soft, No tenderness, No organomegaly appriciated, No rebound - guarding or rigidity. No Cyanosis, Clubbing or edema, No new Rash or bruise     Data Review:    CBC Recent Labs  Lab 07/06/19 0440  07/06/19 2120 07/07/19 0318 07/08/19 0227 07/10/19 0414 07/11/19 0420  WBC 12.7*  --   --  13.9* 10.3  13.0* 9.9  HGB 9.4*   < > 9.7* 9.6* 10.1* 9.0* 8.8*  HCT 30.8*   < > 31.3* 31.2* 33.0* 30.2* 29.1*  PLT 464*  --   --  444* 426* 404* 364  MCV 89.8  --   --  90.4 90.2 90.7 90.1  MCH 27.4  --   --  27.8 27.6 27.0 27.2  MCHC 30.5  --   --  30.8 30.6 29.8* 30.2  RDW 16.5*  --   --  16.6* 16.9* 17.0* 16.9*  LYMPHSABS 3.1  --   --  3.1 0.8 3.3 2.6  MONOABS 1.5*  --   --  1.3* 0.4 1.2* 0.7  EOSABS 0.0  --   --  0.2 0.2 0.2 0.2  BASOSABS 0.1  --   --  0.1 0.0 0.0 0.0   < > = values in this interval not displayed.    Chemistries  Recent Labs  Lab 07/06/19 0440 07/07/19 0318 07/08/19 0227 07/10/19 0414 07/11/19 0420  NA 131* 134* 134* 136 132*  K 4.0 4.0 4.0 3.9 3.7  CL 99 103 102 105 102  CO2 _0 21*  GLUCOSE 158* 97 186* 136* 149*  BUN 87* 81* 73* 90* 80*  CREATININE 1.93* 1.80* 1.61* 2.09* 1.72*  CALCIUM 7.7* 7.9* 7.8* 7.3* 7.3*  MG 1.9 1.8 1.7 1.8 1.7  AST _1 37 54*  ALT _2 ALKPHOS 129* 142* 148* 198* 292*  BILITOT 0.2* 0.2* 0.3 0.5 0.3   ------------------------------------------------------------------------------------------------------------------ No results for input(s): CHOL, HDL, LDLCALC, TRIG, CHOLHDL, LDLDIRECT in the last 72 hours.  Lab Results  Component Value Date   HGBA1C 8.9 (H) 05/24/2019   ------------------------------------------------------------------------------------------------------------------ Recent Labs    07/11/19 0625  TSH 1.490    Cardiac Enzymes No results for input(s): CKMB, TROPONINI, MYOGLOBIN in the last 168 hours.   Invalid input(s): CK ------------------------------------------------------------------------------------------------------------------    Component Value Date/Time   BNP 90.4 07/11/2019 0420    Micro Results Recent Results (from the past 240 hour(s))  C Difficile Quick Screen w PCR reflex     Status: Abnormal   Collection Time: 07/04/19  4:54 AM   Specimen: Nasopharyngeal Swab; Stool  Result Value Ref Range Status   C Diff antigen POSITIVE (A) NEGATIVE Final   C Diff toxin NEGATIVE NEGATIVE Final   C Diff interpretation Results are indeterminate. See PCR results.  Final    Comment: Performed at [facility], [place] [address], [place], [place] [address]  SARS Coronavirus 2 [facility] order, Performed in [facility] hospital lab) Nasopharyngeal Nasopharyngeal Swab     Status: Abnormal   Collection Time: 07/04/19  4:54 AM   Specimen: Nasopharyngeal Swab  Result Value Ref Range Status   SARS Coronavirus 2 POSITIVE (A) NEGATIVE Final    Comment: RESULT CALLED TO, READ BACK BY AND VERIFIED WITH: A. DENNIS,RN [id number] 07/04/2019 T. TYSOR (NOTE) If result is NEGATIVE SARS-CoV-2 target nucleic acids are NOT DETECTED. The SARS-CoV-2 RNA is generally detectable in upper and lower  respiratory specimens during the acute phase of infection. The lowest  concentration of SARS-CoV-2 viral copies this assay can detect is 250  copies / mL. A negative result does not preclude SARS-CoV-2 infection  and should not be used as the sole basis for treatment or other  patient management decisions.  A negative result may occur with  improper specimen collection / handling, submission of specimen other  than nasopharyngeal swab,  presence of viral mutation(s) within the  areas targeted by this assay, and inadequate number of viral copies  (<250 copies / mL). A negative result must be combined with clinical  observations, patient history, and epidemiological information. If result is POSITIVE SARS-CoV-2  target nucleic acids are DETECTED.  The SARS-CoV-2 RNA is generally detectable in upper and lower  respiratory specimens during the acute phase of infection.  Positive  results are indicative of active infection with SARS-CoV-2.  Clinical  correlation with patient history and other diagnostic information is  necessary to determine patient infection status.  Positive results do  not rule out bacterial infection or co-infection with other viruses. If result is PRESUMPTIVE POSTIVE SARS-CoV-2 nucleic acids MAY BE PRESENT.   A presumptive positive result was obtained on the submitted specimen  and confirmed on repeat testing.  While 2019 novel coronavirus  (SARS-CoV-2) nucleic acids may be present in the submitted sample  additional confirmatory testing may be necessary for epidemiological  and / or clinical management purposes  to differentiate between  SARS-CoV-2 and other Sarbecovirus currently known to infect humans.  If clinically indicated additional testing with an alternate test  methodology [phone])  is advised. The SARS-CoV-2 RNA is generally  detectable in upper and lower respiratory specimens during the acute  phase of infection. The expected result is Negative. Fact Sheet for Patients:  StrictlyIdeas.no Fact Sheet for Healthcare Providers: BankingDealers.co.za This test is not yet approved or cleared by the [place] FDA and has been authorized for detection and/or diagnosis of SARS-CoV-2 by FDA under an Emergency Use Authorization (EUA).  This EUA will remain in effect (meaning this test can be used) for the duration of the COVID-19 declaration under Section 564(b)(1) of the Act, 21 U.S.C. section 360bbb-3(b)(1), unless the authorization is terminated or revoked sooner. Performed at [facility], [place] [address] [place], [place] [address]   C. Diff by PCR, Reflexed     Status: Abnormal   Collection Time: 07/04/19   4:54 AM  Result Value Ref Range Status   Toxigenic C. Difficile by PCR POSITIVE (A) NEGATIVE Final    Comment: Positive for toxigenic C. difficile with little to no toxin production. Only treat if clinical presentation suggests symptomatic illness. Performed at [facility], [place] [address] [place], [place] [address]   Blood culture (routine x 2)     Status: None   Collection Time: 07/04/19  5:20 AM   Specimen: BLOOD  Result Value Ref Range Status   Specimen Description BLOOD LEFT ANTECUBITAL  Final   Special Requests   Final    BOTTLES DRAWN AEROBIC AND ANAEROBIC Blood Culture results may not be optimal due to an excessive volume of blood received in culture bottles   Culture   Final    NO GROWTH 5 DAYS Performed at [facility], [place] [address], [place], [place] [address]    Report Status 07/09/2019 FINAL  Final  Blood culture (routine x 2)     Status: None   Collection Time: 07/04/19  5:24 AM   Specimen: BLOOD LEFT HAND  Result Value Ref Range Status   Specimen Description BLOOD LEFT HAND  Final   Special Requests   Final    BOTTLES DRAWN AEROBIC ONLY Blood Culture adequate volume   Culture   Final    NO GROWTH 5 DAYS Performed at [facility], [place] [address] [place], [place] [address]    Report Status 07/09/2019 FINAL  Final  MRSA PCR Screening     Status: None   Collection Time: 07/07/19 10:22 AM   Specimen: Nasopharyngeal  Result Value Ref Range Status   MRSA by PCR NEGATIVE NEGATIVE Final    Comment:        The GeneXpert MRSA Assay (FDA approved for NASAL specimens only), is one component of a comprehensive MRSA colonization surveillance program. It is not intended to diagnose MRSA infection nor to guide or monitor treatment for MRSA infections. Performed at [facility], [place] [address] [place], [place] [address]     Radiology Reports Ct Head Wo Contrast  Result Date: 07/04/2019 CLINICAL DATA:  Mental status. Fall 2 days  ago. Positive COVID-25 May 2019. EXAM: CT HEAD WITHOUT CONTRAST TECHNIQUE: Contiguous axial images were obtained from the base of the skull through the vertex without intravenous contrast. COMPARISON:  None. FINDINGS: Brain: Ventricles, cisterns and other CSF spaces are normal. There is mild chronic ischemic microvascular disease present. There is no mass, mass effect, shift of midline structures or acute hemorrhage. No evidence of acute infarction. Vascular: No hyperdense vessel or unexpected calcification. Skull: Normal. Negative for fracture or focal lesion. Sinuses/Orbits: No acute finding. Other: None. IMPRESSION: No acute findings. Mild chronic ischemic microvascular disease. Electronically Signed   By: Marin Olp M.D.   On: 07/04/2019 07:11   Dg Chest Port 1 View  Result Date: 07/04/2019 CLINICAL DATA:  Altered mental status and hypotension EXAM: PORTABLE CHEST 1 VIEW COMPARISON:  06/03/2019 FINDINGS: Mild cardiomegaly accentuated by low volumes. Otherwise normal mediastinal contours. There is no edema, consolidation, effusion, or pneumothorax. Postoperative proximal left humerus. IMPRESSION: No evidence of acute disease. Electronically Signed   By: Monte Fantasia M.D.   On: 07/04/2019 05:26   [name] Ekg Site Rite  Result Date: 07/11/2019 If Site Rite image not attached, placement could not be confirmed due to current cardiac rhythm.

## 2019-07-11 NOTE — Progress Notes (Signed)
  Speech Language Pathology Treatment: Dysphagia  Patient Details Name: Alice Kemp MRN: [id number] DOB: Jan 11, 1951 Today's Date: 07/11/2019 Time: [id number] SLP Time Calculation (min) (ACUTE ONLY): 32 min  Assessment / Plan / Recommendation Clinical Impression  Pt presents with improved mentation - continues to have confusion, but also asks appropriate questions about her care, her prognosis, and her current condition.  She stated today: "I never thought I would die here." Provided encouragement and offered support.  Pt able to feed herself some items from her lunch tray (her diet was advanced to regular solids) -  Pears and a cookie.  She continues with prolonged chewing, leakage of materials from both sides of her mouth without awareness; required min verbal cues overall; no s/s of aspiration.  Assisted pt with cleaning her face and hands and she settled in to rest. D/W RN.    HPI HPI: Pt is a 68 yo female presenting with AMS from SNF, where she went last month after lumbar decompression surgery. She tested positive for COVID-19 (of note, pt reportedly tested positive in June; chart indicates positive test 8/18 and nmegative test 8/29). PMH: OSA, HLD, glaucoma, HF, DM, depression, CKD, back pain, anxiety      SLP Plan  Continue with current plan of care       Recommendations  Diet recommendations: Regular;Thin liquid Liquids provided via: Cup;Straw Medication Administration: Whole meds with puree Supervision: Staff to assist with self feeding Compensations: Slow rate;Kloos sips/bites Postural Changes and/or Swallow Maneuvers: Seated upright 90 degrees;Upright 30-60 min after meal                Oral Care Recommendations: Oral care BID Follow up Recommendations: Skilled Nursing facility SLP Visit Diagnosis: Dysphagia, unspecified (R13.10) Plan: Continue with current plan of care       GO                Alice Kemp 07/11/2019, 3:15 PM  Alice Kemp L. Tivis Kemp,  [place] Office number [phone] Pager [phone]

## 2019-07-11 NOTE — Consult Note (Signed)
[place] Nurse wound consult note Patient receiving care in [place] for COVID+ condition.  Consult completed remotely after review of record. Reason for Consult: buttocks with Stage 2 from fecal incontinence of C-Diff. Wound type: as above Measurement: To be provided by the bedside RN in the flowsheet section Wound bed: To be provided by the bedside RN in the flowsheet section Drainage (amount, consistency, odor) To be provided by the bedside RN in the flowsheet section Periwound: To be provided by the bedside RN in the flowsheet section Dressing procedure/placement/frequency: Gerhardt's butt cream TID to affected areas. Monitor the wound area(s) for worsening of condition such as: Signs/symptoms of infection,  Increase in size,  Development of or worsening of odor, Development of pain, or increased pain at the affected locations.  Notify the medical team if any of these develop.  Thank you for the consult. [place] nurse will not follow at this time.  Please re-consult the [place] team if needed.  [name] Riles, RN, MSN, CWOCN, CNS-BC, pager [phone]

## 2019-07-12 ENCOUNTER — Ambulatory Visit: Payer: PPO | Admitting: Internal Medicine

## 2019-07-12 DIAGNOSIS — G9349 Other encephalopathy: Secondary | ICD-10-CM | POA: Diagnosis not present

## 2019-07-12 DIAGNOSIS — U071 COVID-19: Secondary | ICD-10-CM | POA: Diagnosis not present

## 2019-07-12 LAB — COMPREHENSIVE METABOLIC PANEL
ALT: 27 U/L (ref 0–44)
AST: 61 U/L — ABNORMAL HIGH (ref 15–41)
Albumin: 1.2 g/dL — ABNORMAL LOW (ref 3.5–5.0)
Alkaline Phosphatase: 358 U/L — ABNORMAL HIGH (ref 38–126)
Anion gap: 7 (ref 5–15)
BUN: 73 mg/dL — ABNORMAL HIGH (ref 8–23)
CO2: 24 mmol/L (ref 22–32)
Calcium: 6.9 mg/dL — ABNORMAL LOW (ref 8.9–10.3)
Chloride: 101 mmol/L (ref 98–111)
Creatinine, Ser: 1.5 mg/dL — ABNORMAL HIGH (ref 0.44–1.00)
GFR calc Af Amer: 41 mL/min — ABNORMAL LOW (ref >60)
GFR calc non Af Amer: 35 mL/min — ABNORMAL LOW (ref >60)
Glucose, Bld: 175 mg/dL — ABNORMAL HIGH (ref 70–99)
Potassium: 4 mmol/L (ref 3.5–5.1)
Sodium: 132 mmol/L — ABNORMAL LOW (ref 135–145)
Total Bilirubin: 0.6 mg/dL (ref 0.3–1.2)
Total Protein: 4.6 g/dL — ABNORMAL LOW (ref 6.5–8.1)

## 2019-07-12 LAB — HEMOGLOBIN AND HEMATOCRIT, BLOOD
HCT: 29 % — ABNORMAL LOW (ref 36.0–46.0)
HCT: 29.4 % — ABNORMAL LOW (ref 36.0–46.0)
Hemoglobin: 9 g/dL — ABNORMAL LOW (ref 12.0–15.0)
Hemoglobin: 8.9 g/dL — ABNORMAL LOW (ref 12.0–15.0)

## 2019-07-12 LAB — MAGNESIUM: Magnesium: 3.2 mg/dL — ABNORMAL HIGH (ref 1.7–2.4)

## 2019-07-12 LAB — CBC WITH DIFFERENTIAL/PLATELET
Abs Immature Granulocytes: 0.19 10*3/uL — ABNORMAL HIGH (ref 0.00–0.07)
Basophils Absolute: 0 10*3/uL (ref 0.0–0.1)
Basophils Relative: 0 %
Eosinophils Absolute: 0.1 10*3/uL (ref 0.0–0.5)
Eosinophils Relative: 1 %
HCT: 23.4 % — ABNORMAL LOW (ref 36.0–46.0)
Hemoglobin: 7.1 g/dL — ABNORMAL LOW (ref 12.0–15.0)
Immature Granulocytes: 2 %
Lymphocytes Relative: 30 %
Lymphs Abs: 3.4 10*3/uL (ref 0.7–4.0)
MCH: 27.5 pg (ref 26.0–34.0)
MCHC: 30.3 g/dL (ref 30.0–36.0)
MCV: 90.7 fL (ref 80.0–100.0)
Monocytes Absolute: 1 10*3/uL (ref 0.1–1.0)
Monocytes Relative: 8 %
Neutro Abs: 6.7 10*3/uL (ref 1.7–7.7)
Neutrophils Relative %: 59 %
Platelets: 290 10*3/uL (ref 150–400)
RBC: 2.58 MIL/uL — ABNORMAL LOW (ref 3.87–5.11)
RDW: 17.2 % — ABNORMAL HIGH (ref 11.5–15.5)
WBC: 11.4 10*3/uL — ABNORMAL HIGH (ref 4.0–10.5)
nRBC: 0 % (ref 0.0–0.2)

## 2019-07-12 LAB — GLUCOSE, CAPILLARY
Glucose-Capillary: 107 mg/dL — ABNORMAL HIGH (ref 70–99)
Glucose-Capillary: 143 mg/dL — ABNORMAL HIGH (ref 70–99)
Glucose-Capillary: 220 mg/dL — ABNORMAL HIGH (ref 70–99)
Glucose-Capillary: 174 mg/dL — ABNORMAL HIGH (ref 70–99)
Glucose-Capillary: 154 mg/dL — ABNORMAL HIGH (ref 70–99)

## 2019-07-12 LAB — TYPE AND SCREEN
ABO/RH(D): A POS
Antibody Screen: NEGATIVE

## 2019-07-12 LAB — T3: T3, Total: 55 ng/dL — ABNORMAL LOW (ref 71–180)

## 2019-07-12 LAB — BRAIN NATRIURETIC PEPTIDE: B Natriuretic Peptide: 117.7 pg/mL — ABNORMAL HIGH (ref 0.0–100.0)

## 2019-07-12 MED ORDER — LACTATED RINGERS IV SOLN
INTRAVENOUS | Status: DC
  Administered 2019-07-12 – 2019-07-14 (×3): via INTRAVENOUS

## 2019-07-12 MED ORDER — FUROSEMIDE 10 MG/ML IJ SOLN
20.0000 mg | Freq: Once | INTRAMUSCULAR | Status: AC
  Administered 2019-07-12: 20 mg via INTRAVENOUS
  Filled 2019-07-12: qty 2

## 2019-07-12 MED ORDER — CHOLESTYRAMINE LIGHT 4 G PO PACK
4.0000 g | PACK | Freq: Once | ORAL | Status: AC
  Administered 2019-07-12: 12:00:00 4 g via ORAL
  Filled 2019-07-12: qty 1

## 2019-07-12 NOTE — Progress Notes (Addendum)
Occupational Therapy Treatment Patient Details Name: Alice Kemp MRN: [id number] DOB: 1951/04/04 Today's Date: 07/12/2019    History of present illness 68 y/o female pt w/ hx of anxiety, arthritis, back pain, benign neoplasm of colon, breast abscess, cervical radiculopathy, CKD III, depression, diastolic HF, DM II, glaucoma, gout, HLD, morbid obesity, obstructive sleep apnea, pericardial effusion, pulmonary HTN, hx of COVID in 06/20. pt underwent decompressive laminectomy L3 partial inferior laminectomy L2 and microscopic discectomy from the left and foraminotomies of the left L3 nerve root on 05/30/19. She had a fall on 07/02/2019, admitted with AMS, hypotension; C-diff.    OT comments  Pt making gradual progress towards OT goals. Pt tolerating sitting EOB for brief period prior to transition OOB to recliner via maximove. Pt overall requiring +2 assist for all aspects of bed mobility as well as external assist for static balance EOB (overall modA). Pt able to perform self-feeding given setup assist and as pt positioned in more optimal upright position. Pt with additional limitations due to pain in buttocks region while upright. VSS stable throughout. Feel POC remains appropriate at this time. Will continue to follow acutely.    Follow Up Recommendations  SNF;Supervision/Assistance - 24 hour    Equipment Recommendations  None recommended by OT          Precautions / Restrictions Precautions Precautions: Fall Precaution Comments: flexiseal, purewick Restrictions Weight Bearing Restrictions: No       Mobility Bed Mobility Overal bed mobility: Needs Assistance Bed Mobility: Rolling;Sidelying to Sit;Sit to Supine Rolling: +2 for physical assistance;Mod assist Sidelying to sit: +2 for physical assistance;Max assist Supine to sit: +2 for physical assistance;Max assist     General bed mobility comments: Pt able to help reach to roll and was assisting in moving bil LEs to EOB.   Significant assist needed to bring trunk up to sitting EOB.  Assist needed at trunk and bil legs to return to supine.   Transfers Overall transfer level: Needs assistance               General transfer comment: Seated EOB pt was interested in trying the standing frame (they had one at SNF for rehab), we did not have it readily available today, so will attempt next session. Pt was lifted OOB to chair for better positioning to finish her lunch.     Balance Overall balance assessment: Needs assistance Sitting-balance support: Feet supported;Bilateral upper extremity supported;Single extremity supported Sitting balance-Leahy Scale: Poor Sitting balance - Comments: generally needed mod assist initially, but was able to progress to min assist once setteled Postural control: Posterior lean                                 ADL either performed or assessed with clinical judgement   ADL Overall ADL's : Needs assistance/impaired Eating/Feeding: Set up;Minimal assistance Eating/Feeding Details (indicate cue type and reason): with setup pt able to use RUE for self feeding lunch - practiced eating while seated EOB and in recliner to optimize upright positioning Grooming: Set up;Minimal assistance;Sitting                               Functional mobility during ADLs: Maximal assistance;+2 for physical assistance;+2 for safety/equipment(bed mobility) General ADL Comments: pt tolerated sitting EOB for brief period for simple ADL task prior to use of maximove for transfer OOB to recliner  Vision       Perception     Praxis      Cognition Arousal/Alertness: Awake/alert Behavior During Therapy: WFL for tasks assessed/performed                                   General Comments: Not specifically assessed today, knew how to call RN, could read the clock, conversation normal.         Exercises     Shoulder Instructions       General  Comments VSS on RA, BP soft but stable.      Pertinent Vitals/ Pain       Pain Assessment: Faces Faces Pain Scale: Hurts even more Pain Location: bottom when seated Pain Descriptors / Indicators: Grimacing;Guarding Pain Intervention(s): Limited activity within patient's tolerance;Monitored during session;Repositioned  Home Living                                          Prior Functioning/Environment              Frequency  Min 2X/week        Progress Toward Goals  OT Goals(current goals can now be found in the care plan section)  Progress towards OT goals: Progressing toward goals  Acute Rehab OT Goals Patient Stated Goal: to get her bottom to stop hurting OT Goal Formulation: With patient Time For Goal Achievement: 07/21/19 Potential to Achieve Goals: Fair ADL Goals Pt Will Perform Eating: with min assist;sitting Pt Will Perform Grooming: with min assist;sitting Pt Will Perform Upper Body Bathing: with min assist;sitting Additional ADL Goal #1: Pt will demonstrate emergent awareness during ADL task in nondistracting environment  Plan Discharge plan remains appropriate    Co-evaluation    PT/OT/SLP Co-Evaluation/Treatment: Yes Reason for Co-Treatment: Complexity of the patient's impairments (multi-system involvement);For patient/therapist safety;To address functional/ADL transfers PT goals addressed during session: Mobility/safety with mobility;Balance;Strengthening/ROM OT goals addressed during session: ADL's and self-care      AM-PAC OT "6 Clicks" Daily Activity     Outcome Measure   Help from another person eating meals?: A Little Help from another person taking care of personal grooming?: A Lot Help from another person toileting, which includes using toliet, bedpan, or urinal?: Total Help from another person bathing (including washing, rinsing, drying)?: Total Help from another person to put on and taking off regular upper body  clothing?: Total Help from another person to put on and taking off regular lower body clothing?: Total 6 Click Score: 9    End of Session    OT Visit Diagnosis: Other abnormalities of gait and mobility (R26.89);Muscle weakness (generalized) (M62.81);Other symptoms and signs involving cognitive function;Pain Pain - part of body: (buttocks)   Activity Tolerance Patient tolerated treatment well   Patient Left in chair;with call bell/phone within reach   Nurse Communication Mobility status;Need for lift equipment        Time: [id number] OT Time Calculation (min): 38 min  Charges: OT General Charges $OT Visit: 1 Visit OT Treatments $Self Care/Home Management : 23-37 mins  Lou Cal, OT [facility] Pager [phone] Office [phone]    Raymondo Band 07/12/2019, 4:06 PM

## 2019-07-12 NOTE — Progress Notes (Signed)
PROGRESS NOTE                                                                                                                                                                                                             Patient Demographics:    Alice Kemp, is a 68 y.o. female, DOB - 1950/10/29, OVF:[id number]  Outpatient Primary MD for the patient is [name], MD    LOS - 8  Admit date - 07/04/2019    Chief Complaint  Patient presents with   Fall       Brief Narrative  Alice Kemp is a 68 y.o. female with medical history significant of hypothyroidism; OSA; morbid obesity (BMI 44); HLD; DM; diastolic CHF; and stage 3 CKD presenting with a fall.    She was admitted to the hospital on 05/24/2019 for a spinal stenosis status post lumbar laminectomy, she was discharged to the SNF on 824.  She also was found to have COVID-19 infection which was incidental finding during that hospitalization.  She tested negative on 06/04/2019.    Patient now was residing at [facility], she reportedly had a fall on 07/02/2019, she then came to the ER and was discharged after a stable evaluation back to SNF.  Subsequently on 07/03/2019 she developed profuse diarrhea and was brought in with severe dehydration and AKI and admitted to the hospital on 07/04/2019.   Subjective:   Patient in bed, appears comfortable, denies any headache, no fever, no chest pain or pressure, no shortness of breath , no abdominal pain. No focal weakness. Feels a lot better today on 07/12/19.   Assessment  & Plan :     1. COVID-19 infection during the ongoing 2020 Covid 19 Pandemic - her initial incidental positive was 05/24/2019, she was asymptomatic from this standpoint, she likely had a false negative test on 06/04/2019.  She is positive again on 07/04/2019 again incidentally while her main symptoms are C. difficile induced diarrhea.  She has no pulmonary symptoms  and clinically appears stable from COVID-19 infection standpoint.   COVID-19 Labs  No results for input(s): DDIMER, FERRITIN, LDH, CRP in the last 72 hours.  Lab Results  Component Value Date   SARSCOV2NAA POSITIVE (A) 07/04/2019   SARSCOV2NAA NOT DETECTED 06/04/2019   SARSCOV2NAA POSITIVE (A) 05/24/2019     Hepatic  Function Latest Ref Rng & Units 07/12/2019 07/11/2019 07/10/2019  Total Protein 6.5 - 8.1 g/dL 4.6(L) 4.6(L) 4.8(L)  Albumin 3.5 - 5.0 g/dL 1.2(L) 1.2(L) 1.3(L)  AST 15 - 41 U/L 61(H) 54(H) 37  ALT 0 - 44 U/L '27 22 18  '$ Alk Phosphatase 38 - 126 U/L 358(H) 292(H) 198(H)  Total Bilirubin 0.3 - 1.2 mg/dL 0.6 0.3 0.5  Bilirubin, Direct 0.0 - 0.3 mg/dL - - -        Component Value Date/Time   BNP 117.7 (H) 07/12/2019 0500      2.  Severe C. difficile colitis with diarrhea causing dehydration and AKI.  Placed on high dose vancomycin+ fidaxomicin added on 07/07/2019. Still having ++ diarrhea, Continue IVF.  Discussed her case with ID physician Dr. Linus Salmons and GI physician Dr. Lyndel Safe consensus is to add IV Flagyl on 07/11/2019 along with few doses of cholestyramine to try and slow down her diarrhea.  KUB stable, abdominal exam benign, clinically she looks the best that she has on 07/12/2019.  3.  History of depression.  Continue combination of amitriptyline, Wellbutrin and Cymbalta.  4.  Morbid obesity.  BMI 44.  Follow with PCP for weight loss.  5.  Hypothyroidism.  Continue home dose Synthroid.    6.  Persistent hypotension.  Due to GI loss.  Continue to give IV fluid bolus and IV fluid maintenance, stable TSH and am Cortisol, try to slow down diarrhea as above.  7.  Recent L-spine spinal stenosis surgery last month on 05/30/2019 by Dr. Saintclair Halsted.  Supportive care.   8.  Dysphagia.  Speech following currently on dysphagia 1 diet, oral intake is poor added pro-stat as well.    9. DM type II.  On Lantus and sliding scale will monitor and adjust.  Poor outpatient control due to  hyperglycemia with A1c of 8.9 on 05/24/2019.  Due to her poor oral intake have liberalized her diet to encourage better oral intake.  CBG (last 3)  Recent Labs    07/11/19 0813 07/11/19 1841 07/12/19 0804  GLUCAP 125* 145* 143*    Lab Results  Component Value Date   HGBA1C 8.9 (H) 05/24/2019     Condition - Extremely Guarded, she is DNR and already has a most form from her home which suggest that she has poor baseline to begin with.  Her only listed family member is her mother.  Family Communication  : Mother on & brother [facility] [phone]  on 07/08/19 - they understands poor prognosis and poor baseline.  Code Status : DNR  Diet :    Diet Order            Diet regular Room service appropriate? Yes; Fluid consistency: Thin  Diet effective now               Disposition Plan  : Inpatient, may require SNF.  Consults  : None  Procedures  :     PUD Prophylaxis :  PPI DC'd  DVT Prophylaxis  :  Lovenox    Lab Results  Component Value Date   PLT 290 07/12/2019    Inpatient Medications  Scheduled Meds:  aspirin  81 mg Oral Daily   buPROPion  100 mg Oral BID   Chlorhexidine Gluconate Cloth  6 each Topical Daily   DULoxetine  30 mg Oral BID   enoxaparin (LOVENOX) injection  40 mg Subcutaneous Q24H   feeding supplement (PRO-STAT SUGAR FREE 64)  30 mL Oral TID WC  furosemide  20 mg Intravenous Once   gabapentin  300 mg Oral TID   Gerhardt's butt cream  1 application Topical TID   insulin aspart  0-15 Units Subcutaneous TID WC   insulin aspart  0-5 Units Subcutaneous QHS   insulin glargine  10 Units Subcutaneous Daily   levothyroxine  88 mcg Oral QAC breakfast   midodrine  10 mg Oral TID WC   rosuvastatin  5 mg Oral Daily   sodium chloride flush  10-40 mL Intracatheter Q12H   vancomycin  500 mg Oral Q6H   Continuous Infusions:  lactated ringers     metronidazole 500 mg (07/12/19 0338)   PRN Meds:.acetaminophen, magic mouthwash  w/lidocaine, [DISCONTINUED] ondansetron **OR** ondansetron (ZOFRAN) IV  Antibiotics  :    Anti-infectives (From admission, onward)   Start     Dose/Rate Route Frequency Ordered Stop   07/12/19 0400  metroNIDAZOLE (FLAGYL) IVPB 500 mg     500 mg 100 mL/hr over 60 Minutes Intravenous Every 8 hours 07/11/19 1935     07/11/19 1200  metroNIDAZOLE (FLAGYL) IVPB 500 mg  Status:  Discontinued     500 mg 100 mL/hr over 60 Minutes Intravenous Every 8 hours 07/11/19 1055 07/11/19 1935   07/09/19 2130  vancomycin (VANCOCIN) 50 mg/mL oral solution 500 mg  Status:  Discontinued     500 mg Oral Every 6 hours 07/08/19 2104 07/08/19 2146   07/08/19 2200  vancomycin (VANCOCIN) 50 mg/mL oral solution 500 mg     500 mg Oral Every 6 hours 07/08/19 2146     07/05/19 1600  remdesivir 100 mg in sodium chloride 0.9 % 250 mL IVPB  Status:  Discontinued     100 mg 500 mL/hr over 30 Minutes Intravenous Every 24 hours 07/04/19 1517 07/05/19 0734   07/04/19 1600  remdesivir 200 mg in sodium chloride 0.9 % 250 mL IVPB     200 mg 500 mL/hr over 30 Minutes Intravenous Once 07/04/19 1517 07/04/19 1941   07/04/19 1400  vancomycin (VANCOCIN) 50 mg/mL oral solution 500 mg  Status:  Discontinued     500 mg Oral Every 6 hours 07/04/19 1134 07/08/19 2104       Time Spent in minutes  30   Lala Lund M.D on 07/12/2019 at 10:51 AM  To page go to [email or web address] - password [facility]  [facility] -  Office  [phone]  See all Orders from today for further details    Objective:   Vitals:   07/11/19 1600 07/11/19 2054 07/12/19 0000 07/12/19 0935  BP: (!) 100/46 (!) 97/43 114/64 (!) 108/54  Pulse: 92 84 82 82  Resp:  20  17  Temp:  97.6 F (36.4 C) 97.9 F (36.6 C) 98.1 F (36.7 C)  TempSrc:  Oral Oral Oral  SpO2: 96% 97% 97% 99%  Weight:      Height:        Wt Readings from Last 3 Encounters:  07/04/19 117 kg  06/05/19 117.8 kg  12/14/18 112.5 kg     Intake/Output Summary (Last 24 hours) at  07/12/2019 1051 Last data filed at 07/12/2019 0947 Gross per 24 hour  Intake 3982.09 ml  Output 1200 ml  Net 2782.09 ml     Physical Exam  Awake looks much better today, no FN deficits, flat affect, pure wick in place, rectal tube in place [place].AT,PERRAL Supple Neck,No JVD, No cervical lymphadenopathy appriciated.  Symmetrical Chest wall movement, Good air movement bilaterally, CTAB RRR,No Gallops, Rubs or  new Murmurs, No Parasternal Heave +ve B.Sounds, Abd Soft, No tenderness, No organomegaly appriciated, No rebound - guarding or rigidity. No Cyanosis, Clubbing or edema, No new Rash or bruise      Data Review:    CBC Recent Labs  Lab 07/07/19 0318 07/08/19 0227 07/10/19 0414 07/11/19 0420 07/12/19 0500  WBC 13.9* 10.3 13.0* 9.9 11.4*  HGB 9.6* 10.1* 9.0* 8.8* 7.1*  HCT 31.2* 33.0* 30.2* 29.1* 23.4*  PLT 444* 426* 404* 364 290  MCV 90.4 90.2 90.7 90.1 90.7  MCH 27.8 27.6 27.0 27.2 27.5  MCHC 30.8 30.6 29.8* 30.2 30.3  RDW 16.6* 16.9* 17.0* 16.9* 17.2*  LYMPHSABS 3.1 0.8 3.3 2.6 3.4  MONOABS 1.3* 0.4 1.2* 0.7 1.0  EOSABS 0.2 0.2 0.2 0.2 0.1  BASOSABS 0.1 0.0 0.0 0.0 0.0    Chemistries  Recent Labs  Lab 07/07/19 0318 07/08/19 0227 07/10/19 0414 07/11/19 0420 07/12/19 0500  NA 134* 134* 136 132* 132*  K 4.0 4.0 3.9 3.7 4.0  CL 103 102 105 102 101  CO2 '23 23 23 '$ 21* 24  GLUCOSE 97 186* 136* 149* 175*  BUN 81* 73* 90* 80* 73*  CREATININE 1.80* 1.61* 2.09* 1.72* 1.50*  CALCIUM 7.9* 7.8* 7.3* 7.3* 6.9*  MG 1.8 1.7 1.8 1.7 3.2*  AST 19 18 37 54* 61*  ALT '12 13 18 22 27  '$ ALKPHOS 142* 148* 198* 292* 358*  BILITOT 0.2* 0.3 0.5 0.3 0.6   ------------------------------------------------------------------------------------------------------------------ No results for input(s): CHOL, HDL, LDLCALC, TRIG, CHOLHDL, LDLDIRECT in the last 72 hours.  Lab Results  Component Value Date   HGBA1C 8.9 (H) 05/24/2019    ------------------------------------------------------------------------------------------------------------------ Recent Labs    07/11/19 0625  TSH 1.490    Cardiac Enzymes No results for input(s): CKMB, TROPONINI, MYOGLOBIN in the last 168 hours.  Invalid input(s): CK ------------------------------------------------------------------------------------------------------------------    Component Value Date/Time   BNP 117.7 (H) 07/12/2019 0500    Micro Results Recent Results (from the past 240 hour(s))  C Difficile Quick Screen w PCR reflex     Status: Abnormal   Collection Time: 07/04/19  4:54 AM   Specimen: Nasopharyngeal Swab; Stool  Result Value Ref Range Status   C Diff antigen POSITIVE (A) NEGATIVE Final   C Diff toxin NEGATIVE NEGATIVE Final   C Diff interpretation Results are indeterminate. See PCR results.  Final    Comment: Performed at [facility], [place] [address] [place], [place] [address]  SARS Coronavirus 2 [facility] order, Performed in [facility] hospital lab) Nasopharyngeal Nasopharyngeal Swab     Status: Abnormal   Collection Time: 07/04/19  4:54 AM   Specimen: Nasopharyngeal Swab  Result Value Ref Range Status   SARS Coronavirus 2 POSITIVE (A) NEGATIVE Final    Comment: RESULT CALLED TO, READ BACK BY AND VERIFIED WITH: A. DENNIS,RN 0962 07/04/2019 T. TYSOR (NOTE) If result is NEGATIVE SARS-CoV-2 target nucleic acids are NOT DETECTED. The SARS-CoV-2 RNA is generally detectable in upper and lower  respiratory specimens during the acute phase of infection. The lowest  concentration of SARS-CoV-2 viral copies this assay can detect is 250  copies / mL. A negative result does not preclude SARS-CoV-2 infection  and should not be used as the sole basis for treatment or other  patient management decisions.  A negative result may occur with  improper specimen collection / handling, submission of specimen other  than nasopharyngeal swab, presence of viral  mutation(s) within the  areas targeted by this assay, and  inadequate number of viral copies  (<250 copies / mL). A negative result must be combined with clinical  observations, patient history, and epidemiological information. If result is POSITIVE SARS-CoV-2 target nucleic acids are DETECTED.  The SARS-CoV-2 RNA is generally detectable in upper and lower  respiratory specimens during the acute phase of infection.  Positive  results are indicative of active infection with SARS-CoV-2.  Clinical  correlation with patient history and other diagnostic information is  necessary to determine patient infection status.  Positive results do  not rule out bacterial infection or co-infection with other viruses. If result is PRESUMPTIVE POSTIVE SARS-CoV-2 nucleic acids MAY BE PRESENT.   A presumptive positive result was obtained on the submitted specimen  and confirmed on repeat testing.  While 2019 novel coronavirus  (SARS-CoV-2) nucleic acids may be present in the submitted sample  additional confirmatory testing may be necessary for epidemiological  and / or clinical management purposes  to differentiate between  SARS-CoV-2 and other Sarbecovirus currently known to infect humans.  If clinically indicated additional testing with an alternate test  methodology [phone])  is advised. The SARS-CoV-2 RNA is generally  detectable in upper and lower respiratory specimens during the acute  phase of infection. The expected result is Negative. Fact Sheet for Patients:  StrictlyIdeas.no Fact Sheet for Healthcare Providers: BankingDealers.co.za This test is not yet approved or cleared by the [place] FDA and has been authorized for detection and/or diagnosis of SARS-CoV-2 by FDA under an Emergency Use Authorization (EUA).  This EUA will remain in effect (meaning this test can be used) for the duration of the COVID-19 declaration under Section 564(b)(1)  of the Act, 21 U.S.C. section 360bbb-3(b)(1), unless the authorization is terminated or revoked sooner. Performed at [facility], [place] [address], [place], [place] [address]   C. Diff by PCR, Reflexed     Status: Abnormal   Collection Time: 07/04/19  4:54 AM  Result Value Ref Range Status   Toxigenic C. Difficile by PCR POSITIVE (A) NEGATIVE Final    Comment: Positive for toxigenic C. difficile with little to no toxin production. Only treat if clinical presentation suggests symptomatic illness. Performed at [facility], [place] [address], [place], [place] [address]   Blood culture (routine x 2)     Status: None   Collection Time: 07/04/19  5:20 AM   Specimen: BLOOD  Result Value Ref Range Status   Specimen Description BLOOD LEFT ANTECUBITAL  Final   Special Requests   Final    BOTTLES DRAWN AEROBIC AND ANAEROBIC Blood Culture results may not be optimal due to an excessive volume of blood received in culture bottles   Culture   Final    NO GROWTH 5 DAYS Performed at [facility], [place] [address] [place], [place] [address]    Report Status 07/09/2019 FINAL  Final  Blood culture (routine x 2)     Status: None   Collection Time: 07/04/19  5:24 AM   Specimen: BLOOD LEFT HAND  Result Value Ref Range Status   Specimen Description BLOOD LEFT HAND  Final   Special Requests   Final    BOTTLES DRAWN AEROBIC ONLY Blood Culture adequate volume   Culture   Final    NO GROWTH 5 DAYS Performed at [facility], [place] [address] [place], [place] [address]    Report Status 07/09/2019 FINAL  Final  MRSA PCR Screening     Status: None   Collection  Time: 07/07/19 10:22 AM   Specimen: Nasopharyngeal  Result Value Ref Range Status   MRSA by PCR NEGATIVE NEGATIVE Final    Comment:        The GeneXpert MRSA Assay (FDA approved for NASAL specimens only), is one component of a comprehensive MRSA colonization surveillance program. It is not intended to diagnose MRSA infection  nor to guide or monitor treatment for MRSA infections. Performed at [facility], [place] [address], [place], [place] [address]     Radiology Reports Ct Head Wo Contrast  Result Date: 07/04/2019 CLINICAL DATA:  Mental status. Fall 2 days ago. Positive COVID-25 May 2019. EXAM: CT HEAD WITHOUT CONTRAST TECHNIQUE: Contiguous axial images were obtained from the base of the skull through the vertex without intravenous contrast. COMPARISON:  None. FINDINGS: Brain: Ventricles, cisterns and other CSF spaces are normal. There is mild chronic ischemic microvascular disease present. There is no mass, mass effect, shift of midline structures or acute hemorrhage. No evidence of acute infarction. Vascular: No hyperdense vessel or unexpected calcification. Skull: Normal. Negative for fracture or focal lesion. Sinuses/Orbits: No acute finding. Other: None. IMPRESSION: No acute findings. Mild chronic ischemic microvascular disease. Electronically Signed   By: Marin Olp M.D.   On: 07/04/2019 07:11   Dg Chest Port 1 View  Result Date: 07/11/2019 CLINICAL DATA:  Evaluate PICC tip. EXAM: PORTABLE CHEST 1 VIEW COMPARISON:  Chest x-ray from same day. FINDINGS: Interval retraction of the right upper extremity PICC line with the tip now in the distal SVC. Stable cardiomediastinal silhouette. Normal pulmonary vascularity. Continued low lung volumes with mild bibasilar atelectasis, slightly improved. No focal consolidation, pleural effusion, or pneumothorax. No acute osseous abnormality. IMPRESSION: 1. Right upper extremity PICC line tip now in the distal SVC. 2. Continued low lung volumes with mildly improved bibasilar atelectasis. Electronically Signed   By: Titus Dubin M.D.   On: 07/11/2019 18:41   Dg Chest Port 1 View  Result Date: 07/11/2019 CLINICAL DATA:  PICC placement EXAM: PORTABLE CHEST 1 VIEW COMPARISON:  07/04/2019 FINDINGS: Right arm PICC is been placed. Tip in the mid right atrium.  Recommend withdrawal of 3 cm for optimum position Hypoventilation with bibasilar atelectasis.  No effusion or edema. IMPRESSION: PICC tip in the mid right atrium.  Recommend withdrawal 3 cm Hypoventilation with bibasilar atelectasis. Electronically Signed   By: Franchot Gallo M.D.   On: 07/11/2019 17:30   Dg Chest Port 1 View  Result Date: 07/04/2019 CLINICAL DATA:  Altered mental status and hypotension EXAM: PORTABLE CHEST 1 VIEW COMPARISON:  06/03/2019 FINDINGS: Mild cardiomegaly accentuated by low volumes. Otherwise normal mediastinal contours. There is no edema, consolidation, effusion, or pneumothorax. Postoperative proximal left humerus. IMPRESSION: No evidence of acute disease. Electronically Signed   By: Monte Fantasia M.D.   On: 07/04/2019 05:26   Dg Abd Portable 1v  Result Date: 07/11/2019 CLINICAL DATA:  Diarrhea and C diff EXAM: PORTABLE ABDOMEN - 1 VIEW COMPARISON:  None. FINDINGS: Gas-filled loops of colon within normal limits. No dilated loops of [place] bowel. Gas the rectum. IMPRESSION: Gas-filled colon. No evidence obstruction or inflammation by plain film imaging Electronically Signed   By: Suzy Bouchard M.D.   On: 07/11/2019 11:32   [name] Ekg Site Rite  Result Date: 07/11/2019 If Site Rite image not attached, placement could not be confirmed due to current cardiac rhythm.

## 2019-07-12 NOTE — Progress Notes (Signed)
Physical Therapy Treatment Patient Details Name: Alice Kemp MRN: [id number] DOB: 1950-12-19 Today's Date: 07/12/2019    History of Present Illness 68 y/o female pt w/ hx of anxiety, arthritis, back pain, benign neoplasm of colon, breast abscess, cervical radiculopathy, CKD III, depression, diastolic HF, DM II, glaucoma, gout, HLD, morbid obesity, obstructive sleep apnea, pericardial effusion, pulmonary HTN, hx of COVID in 06/20. pt underwent decompressive laminectomy L3 partial inferior laminectomy L2 and microscopic discectomy from the left and foraminotomies of the left L3 nerve root on 05/30/19. She had a fall on 07/02/2019, admitted with AMS, hypotension; C-diff.     PT Comments    Co session completed with OT for EOB and OOB mobility via the maxi move lift.  Pt is interested in attempting standing or partial standing EOB (would use steady stander to safely attempt).  She did well EOB, only c/o pain in her rectum (likely where the rectal tube is going). VSS on RA. PT will continue to follow acutely for safe mobility progression.  Follow Up Recommendations  SNF     Equipment Recommendations  Hospital bed;Wheelchair (measurements PT);Wheelchair cushion (measurements PT);3in1 (PT);Other (comment)(hoyer lift)    Recommendations for Other Services   NA     Precautions / Restrictions Precautions Precautions: Fall Precaution Comments: flexiseal, purewick Restrictions Weight Bearing Restrictions: No    Mobility  Bed Mobility Overal bed mobility: Needs Assistance Bed Mobility: Rolling;Sidelying to Sit;Sit to Supine Rolling: +2 for physical assistance;Mod assist Sidelying to sit: +2 for physical assistance;Max assist Supine to sit: +2 for physical assistance;Max assist     General bed mobility comments: Pt able to help reach to roll and was assisting in moving bil LEs to EOB.  Significant assist needed to bring trunk up to sitting EOB.  Assist needed at trunk and bil legs to return  to supine.   Transfers Overall transfer level: Needs assistance               General transfer comment: Seated EOB pt was interested in trying the standing frame (they had one at SNF for rehab), we did not have it readily available today, so will attempt next session. Pt was lifted OOB to chair for better positioning to finish her lunch.          Balance Overall balance assessment: Needs assistance Sitting-balance support: Feet supported;Bilateral upper extremity supported;Single extremity supported Sitting balance-Leahy Scale: Poor Sitting balance - Comments: generally needed mod assist initially, but was able to progress to min assist once setteled Postural control: Posterior lean                                  Cognition Arousal/Alertness: Awake/alert Behavior During Therapy: WFL for tasks assessed/performed                                   General Comments: Not specifically assessed today, knew how to call RN, could read the clock, conversation normal.          General Comments General comments (skin integrity, edema, etc.): VSS on RA, BP soft but stable.        Pertinent Vitals/Pain Pain Assessment: Faces Faces Pain Scale: Hurts even more Pain Location: bottom when seated Pain Descriptors / Indicators: Grimacing;Guarding Pain Intervention(s): Limited activity within patient's tolerance;Monitored during session;Repositioned  PT Goals (current goals can now be found in the care plan section) Acute Rehab PT Goals Patient Stated Goal: to get her bottom to stop hurting Progress towards PT goals: Progressing toward goals    Frequency    Min 2X/week      PT Plan Current plan remains appropriate    Co-evaluation PT/OT/SLP Co-Evaluation/Treatment: Yes Reason for Co-Treatment: Complexity of the patient's impairments (multi-system involvement);For patient/therapist safety;To address functional/ADL transfers PT goals  addressed during session: Mobility/safety with mobility;Balance;Strengthening/ROM        AM-PAC PT "6 Clicks" Mobility   Outcome Measure  Help needed turning from your back to your side while in a flat bed without using bedrails?: A Lot Help needed moving from lying on your back to sitting on the side of a flat bed without using bedrails?: Total Help needed moving to and from a bed to a chair (including a wheelchair)?: Total Help needed standing up from a chair using your arms (e.g., wheelchair or bedside chair)?: Total Help needed to walk in hospital room?: Total Help needed climbing 3-5 steps with a railing? : Total 6 Click Score: 7    End of Session   Activity Tolerance: Patient limited by fatigue;Patient limited by pain Patient left: in chair;with call bell/phone within reach Nurse Communication: Mobility status;Need for lift equipment PT Visit Diagnosis: Other abnormalities of gait and mobility (R26.89);History of falling (Z91.81)     Time: [id number] PT Time Calculation (min) (ACUTE ONLY): 38 min  Charges:  $Therapeutic Activity: 8-22 mins                    Joshaua Epple B. Jerel Sardina, PT, DPT  Acute Rehabilitation [phone] pager [phone] office  @ [facility]: [phone]   07/12/2019, 3:32 PM

## 2019-07-13 DIAGNOSIS — N179 Acute kidney failure, unspecified: Secondary | ICD-10-CM | POA: Diagnosis not present

## 2019-07-13 DIAGNOSIS — I5032 Chronic diastolic (congestive) heart failure: Secondary | ICD-10-CM | POA: Diagnosis not present

## 2019-07-13 DIAGNOSIS — U071 COVID-19: Secondary | ICD-10-CM | POA: Diagnosis not present

## 2019-07-13 DIAGNOSIS — E039 Hypothyroidism, unspecified: Secondary | ICD-10-CM | POA: Diagnosis not present

## 2019-07-13 LAB — CBC WITH DIFFERENTIAL/PLATELET
Abs Immature Granulocytes: 0.18 10*3/uL — ABNORMAL HIGH (ref 0.00–0.07)
Basophils Absolute: 0 10*3/uL (ref 0.0–0.1)
Basophils Relative: 0 %
Eosinophils Absolute: 0.2 10*3/uL (ref 0.0–0.5)
Eosinophils Relative: 2 %
HCT: 29.4 % — ABNORMAL LOW (ref 36.0–46.0)
Hemoglobin: 8.9 g/dL — ABNORMAL LOW (ref 12.0–15.0)
Immature Granulocytes: 2 %
Lymphocytes Relative: 34 %
Lymphs Abs: 3.6 10*3/uL (ref 0.7–4.0)
MCH: 27.2 pg (ref 26.0–34.0)
MCHC: 30.3 g/dL (ref 30.0–36.0)
MCV: 89.9 fL (ref 80.0–100.0)
Monocytes Absolute: 1 10*3/uL (ref 0.1–1.0)
Monocytes Relative: 10 %
Neutro Abs: 5.6 10*3/uL (ref 1.7–7.7)
Neutrophils Relative %: 52 %
Platelets: 346 10*3/uL (ref 150–400)
RBC: 3.27 MIL/uL — ABNORMAL LOW (ref 3.87–5.11)
RDW: 17.2 % — ABNORMAL HIGH (ref 11.5–15.5)
WBC: 10.5 10*3/uL (ref 4.0–10.5)
nRBC: 0 % (ref 0.0–0.2)

## 2019-07-13 LAB — COMPREHENSIVE METABOLIC PANEL
ALT: 26 U/L (ref 0–44)
AST: 43 U/L — ABNORMAL HIGH (ref 15–41)
Albumin: 1.3 g/dL — ABNORMAL LOW (ref 3.5–5.0)
Alkaline Phosphatase: 335 U/L — ABNORMAL HIGH (ref 38–126)
Anion gap: 8 (ref 5–15)
BUN: 56 mg/dL — ABNORMAL HIGH (ref 8–23)
CO2: 24 mmol/L (ref 22–32)
Calcium: 7 mg/dL — ABNORMAL LOW (ref 8.9–10.3)
Chloride: 102 mmol/L (ref 98–111)
Creatinine, Ser: 1.21 mg/dL — ABNORMAL HIGH (ref 0.44–1.00)
GFR calc Af Amer: 53 mL/min — ABNORMAL LOW (ref >60)
GFR calc non Af Amer: 46 mL/min — ABNORMAL LOW (ref >60)
Glucose, Bld: 122 mg/dL — ABNORMAL HIGH (ref 70–99)
Potassium: 3.5 mmol/L (ref 3.5–5.1)
Sodium: 134 mmol/L — ABNORMAL LOW (ref 135–145)
Total Bilirubin: 0.5 mg/dL (ref 0.3–1.2)
Total Protein: 4.7 g/dL — ABNORMAL LOW (ref 6.5–8.1)

## 2019-07-13 LAB — C-REACTIVE PROTEIN: CRP: 7.2 mg/dL — ABNORMAL HIGH (ref <1.0)

## 2019-07-13 LAB — GLUCOSE, CAPILLARY
Glucose-Capillary: 122 mg/dL — ABNORMAL HIGH (ref 70–99)
Glucose-Capillary: 100 mg/dL — ABNORMAL HIGH (ref 70–99)
Glucose-Capillary: 130 mg/dL — ABNORMAL HIGH (ref 70–99)
Glucose-Capillary: 105 mg/dL — ABNORMAL HIGH (ref 70–99)
Glucose-Capillary: 127 mg/dL — ABNORMAL HIGH (ref 70–99)

## 2019-07-13 LAB — MAGNESIUM: Magnesium: 1.7 mg/dL (ref 1.7–2.4)

## 2019-07-13 LAB — BRAIN NATRIURETIC PEPTIDE: B Natriuretic Peptide: 148.7 pg/mL — ABNORMAL HIGH (ref 0.0–100.0)

## 2019-07-13 MED ORDER — METRONIDAZOLE 500 MG PO TABS
500.0000 mg | ORAL_TABLET | Freq: Three times a day (TID) | ORAL | Status: DC
  Administered 2019-07-13 – 2019-07-15 (×5): 500 mg via ORAL
  Filled 2019-07-13 (×7): qty 1

## 2019-07-13 NOTE — Progress Notes (Signed)
Called pt's mother on room phone for pt to chat with her.  No questions asked of nurse by family at this time.

## 2019-07-13 NOTE — Progress Notes (Signed)
PROGRESS NOTE                                                                                                                                                                                                             Patient Demographics:    Alice Kemp, is a 68 y.o. female, DOB - 1950/12/02, [id number]  Outpatient Primary MD for the patient is [name], MD   Admit date - 07/04/2019   LOS - 9  Chief Complaint  Patient presents with  . Fall       Brief Narrative: Patient is a 68 y.o. female with PMHx of hypothyroidism, morbid obesity, dyslipidemia, DM, chronic diastolic heart failure, CKD stage III who was hospitalized in August for spinal stenosis-she underwent a lumbar laminectomy-she was incidentally found to have COVID-19 during that hospitalization.  She presented to the ED for evaluation of fall along with profuse diarrhea-she was subsequently found to have C. difficile colitis along with severe dehydration and AKI.   Subjective:    Clema Fisette today feels much better-her diarrhea has slowed down.  Although stools are still liquidy-she thinks they are getting more formed.  Assessment  & Plan :   Severe C. difficile colitis: Diarrhea improving-continue vancomycin/Flagyl.  She does not have leukocytosis or abdominal pain.  Very benign physical exam.  AKI: Secondary to hemodynamically mediated kidney injury in the setting of diarrhea.  Improved with supportive care.  Hypotension: Secondary to volume loss due to diarrhea-BP now stable-continue midodrine-suspect can be tapered off over the next few days  COVID-19 infection: Essentially asymptomatic-supportive care  Depression: Stable-continue amitriptyline, Wellbutrin and Cymbalta  Hypothyroidism: Continue Synthroid   Chronic diastolic heart failure: Volume status stable-hold diuretics till diarrhea improves further.  Hypertension: Given  hypotension-continue to hold all antihypertensives.  DM-2: CBGs stable-continue Lantus 10 units and SSI.  Dyslipidemia: Continue with Synthroid  Stage II sacral decubitus ulcer: Continue per wound care.   Recent L-spine spinal stenosis surgery last month on 05/30/2019 by Dr. Saintclair Halsted.  Supportive care.   Obesity: Estimated body mass index is 44.25 kg/m as calculated from the following:   Height as of this encounter: 5' 4.02" (1.626 m).   Weight as of this encounter: 117 kg.   Debility/deconditioning: Significant worsening than baseline due to severe C. difficile infection associated debility-PT/OT following  ABG:  Component Value Date/Time   PHART 7.420 (H) 01/28/2011 1502   PCO2ART 39.8 01/28/2011 1502   PO2ART 68.0 (L) 01/28/2011 1502   HCO3 25.8 (H) 01/28/2011 1502   TCO2 27 01/28/2011 1502   ACIDBASEDEF 2.0 01/28/2011 1453   O2SAT 94.0 01/28/2011 1502    Vent Settings: N/A   Condition - Stable  Family Communication  : None at bedside-we will reach out to patient's mother over the next few days.  Code Status :  DNR  Diet :  Diet Order            Diet regular Room service appropriate? Yes; Fluid consistency: Thin  Diet effective now               Disposition Plan  :  Remain hospitalized-SNF versus home health services on discharge  Barriers to discharge: Ongoing diarrhea  Consults  :  None  Procedures  :  None  Antibiotics  :    Anti-infectives (From admission, onward)   Start     Dose/Rate Route Frequency Ordered Stop   07/13/19 1000  metroNIDAZOLE (FLAGYL) tablet 500 mg     500 mg Oral Every 8 hours 07/13/19 0952     07/12/19 0400  metroNIDAZOLE (FLAGYL) IVPB 500 mg  Status:  Discontinued     500 mg 100 mL/hr over 60 Minutes Intravenous Every 8 hours 07/11/19 1935 07/13/19 0952   07/11/19 1200  metroNIDAZOLE (FLAGYL) IVPB 500 mg  Status:  Discontinued     500 mg 100 mL/hr over 60 Minutes Intravenous Every 8 hours 07/11/19 1055 07/11/19 1935    07/09/19 2130  vancomycin (VANCOCIN) 50 mg/mL oral solution 500 mg  Status:  Discontinued     500 mg Oral Every 6 hours 07/08/19 2104 07/08/19 2146   07/08/19 2200  vancomycin (VANCOCIN) 50 mg/mL oral solution 500 mg     500 mg Oral Every 6 hours 07/08/19 2146     07/05/19 1600  remdesivir 100 mg in sodium chloride 0.9 % 250 mL IVPB  Status:  Discontinued     100 mg 500 mL/hr over 30 Minutes Intravenous Every 24 hours 07/04/19 1517 07/05/19 0734   07/04/19 1600  remdesivir 200 mg in sodium chloride 0.9 % 250 mL IVPB     200 mg 500 mL/hr over 30 Minutes Intravenous Once 07/04/19 1517 07/04/19 1941   07/04/19 1400  vancomycin (VANCOCIN) 50 mg/mL oral solution 500 mg  Status:  Discontinued     500 mg Oral Every 6 hours 07/04/19 1134 07/08/19 2104      Inpatient Medications  Scheduled Meds: . aspirin  81 mg Oral Daily  . buPROPion  100 mg Oral BID  . Chlorhexidine Gluconate Cloth  6 each Topical Daily  . DULoxetine  30 mg Oral BID  . enoxaparin (LOVENOX) injection  40 mg Subcutaneous Q24H  . feeding supplement (PRO-STAT SUGAR FREE 64)  30 mL Oral TID WC  . gabapentin  300 mg Oral TID  . Gerhardt's butt cream  1 application Topical TID  . insulin aspart  0-15 Units Subcutaneous TID WC  . insulin aspart  0-5 Units Subcutaneous QHS  . insulin glargine  10 Units Subcutaneous Daily  . levothyroxine  88 mcg Oral QAC breakfast  . metroNIDAZOLE  500 mg Oral Q8H  . midodrine  10 mg Oral TID WC  . rosuvastatin  5 mg Oral Daily  . sodium chloride flush  10-40 mL Intracatheter Q12H  . vancomycin  500 mg Oral Q6H  Continuous Infusions: . lactated ringers 50 mL/hr at 07/13/19 0827   PRN Meds:.acetaminophen, magic mouthwash w/lidocaine, [DISCONTINUED] ondansetron **OR** ondansetron (ZOFRAN) IV   Time Spent in minutes  25  See all Orders from today for further details   Oren Binet M.D on 07/13/2019 at 2:49 PM  To page go to [email or web address] - use universal password  [facility]  -  Office  [phone]    Objective:   Vitals:   07/13/19 0353 07/13/19 0801 07/13/19 1135 07/13/19 1146  BP: 118/66 118/68  (!) 103/54  Pulse: 80 78 85   Resp:  16    Temp: (!) 97.4 F (36.3 C) 98 F (36.7 C) 98.3 F (36.8 C)   TempSrc: Axillary Oral Oral   SpO2: 98% 98%  98%  Weight:      Height:        Wt Readings from Last 3 Encounters:  07/04/19 117 kg  06/05/19 117.8 kg  12/14/18 112.5 kg     Intake/Output Summary (Last 24 hours) at 07/13/2019 1449 Last data filed at 07/13/2019 1145 Gross per 24 hour  Intake 2336.89 ml  Output 1550 ml  Net 786.89 ml     Physical Exam Gen Exam:Alert awake-not in any distress HEENT:atraumatic, normocephalic Chest: B/L clear to auscultation anteriorly CVS:S1S2 regular Abdomen:soft non tender, non distended Extremities:no edema Neurology: Non focal Skin: no rash   Data Review:    CBC Recent Labs  Lab 07/08/19 0227 07/10/19 0414 07/11/19 0420 07/12/19 0500 07/12/19 1002 07/12/19 1312 07/13/19 0400  WBC 10.3 13.0* 9.9 11.4*  --   --  10.5  HGB 10.1* 9.0* 8.8* 7.1* 9.0* 8.9* 8.9*  HCT 33.0* 30.2* 29.1* 23.4* 29.0* 29.4* 29.4*  PLT 426* 404* 364 290  --   --  346  MCV 90.2 90.7 90.1 90.7  --   --  89.9  MCH 27.6 27.0 27.2 27.5  --   --  27.2  MCHC 30.6 29.8* 30.2 30.3  --   --  30.3  RDW 16.9* 17.0* 16.9* 17.2*  --   --  17.2*  LYMPHSABS 0.8 3.3 2.6 3.4  --   --  3.6  MONOABS 0.4 1.2* 0.7 1.0  --   --  1.0  EOSABS 0.2 0.2 0.2 0.1  --   --  0.2  BASOSABS 0.0 0.0 0.0 0.0  --   --  0.0    Chemistries  Recent Labs  Lab 07/08/19 0227 07/10/19 0414 07/11/19 0420 07/12/19 0500 07/13/19 0400  NA 134* 136 132* 132* 134*  K 4.0 3.9 3.7 4.0 3.5  CL 102 105 102 101 102  CO2 23 23 21* 24 24  GLUCOSE 186* 136* 149* 175* 122*  BUN 73* 90* 80* 73* 56*  CREATININE 1.61* 2.09* 1.72* 1.50* 1.21*  CALCIUM 7.8* 7.3* 7.3* 6.9* 7.0*  MG 1.7 1.8 1.7 3.2* 1.7  AST 18 37 54* 61* 43*  ALT 13 18 22 27 26   ALKPHOS 148* 198*  292* 358* 335*  BILITOT 0.3 0.5 0.3 0.6 0.5   ------------------------------------------------------------------------------------------------------------------ No results for input(s): CHOL, HDL, LDLCALC, TRIG, CHOLHDL, LDLDIRECT in the last 72 hours.  Lab Results  Component Value Date   HGBA1C 8.9 (H) 05/24/2019   ------------------------------------------------------------------------------------------------------------------ Recent Labs    07/11/19 0625  TSH 1.490   ------------------------------------------------------------------------------------------------------------------ No results for input(s): VITAMINB12, FOLATE, FERRITIN, TIBC, IRON, RETICCTPCT in the last 72 hours.  Coagulation profile No results for input(s): INR, PROTIME in the last 168 hours.  No results for  input(s): DDIMER in the last 72 hours.  Cardiac Enzymes No results for input(s): CKMB, TROPONINI, MYOGLOBIN in the last 168 hours.  Invalid input(s): CK ------------------------------------------------------------------------------------------------------------------    Component Value Date/Time   BNP 148.7 (H) 07/13/2019 0400    Micro Results Recent Results (from the past 240 hour(s))  C Difficile Quick Screen w PCR reflex     Status: Abnormal   Collection Time: 07/04/19  4:54 AM   Specimen: Nasopharyngeal Swab; Stool  Result Value Ref Range Status   C Diff antigen POSITIVE (A) NEGATIVE Final   C Diff toxin NEGATIVE NEGATIVE Final   C Diff interpretation Results are indeterminate. See PCR results.  Final    Comment: Performed at [facility], [place] [address], [place], [place] [address]  SARS Coronavirus 2 [facility] order, Performed in [facility] hospital lab) Nasopharyngeal Nasopharyngeal Swab     Status: Abnormal   Collection Time: 07/04/19  4:54 AM   Specimen: Nasopharyngeal Swab  Result Value Ref Range Status   SARS Coronavirus 2 POSITIVE (A) NEGATIVE Final    Comment: RESULT CALLED  TO, READ BACK BY AND VERIFIED WITH: A. DENNIS,RN [id number] 07/04/2019 T. TYSOR (NOTE) If result is NEGATIVE SARS-CoV-2 target nucleic acids are NOT DETECTED. The SARS-CoV-2 RNA is generally detectable in upper and lower  respiratory specimens during the acute phase of infection. The lowest  concentration of SARS-CoV-2 viral copies this assay can detect is 250  copies / mL. A negative result does not preclude SARS-CoV-2 infection  and should not be used as the sole basis for treatment or other  patient management decisions.  A negative result may occur with  improper specimen collection / handling, submission of specimen other  than nasopharyngeal swab, presence of viral mutation(s) within the  areas targeted by this assay, and inadequate number of viral copies  (<250 copies / mL). A negative result must be combined with clinical  observations, patient history, and epidemiological information. If result is POSITIVE SARS-CoV-2 target nucleic acids are DETECTED.  The SARS-CoV-2 RNA is generally detectable in upper and lower  respiratory specimens during the acute phase of infection.  Positive  results are indicative of active infection with SARS-CoV-2.  Clinical  correlation with patient history and other diagnostic information is  necessary to determine patient infection status.  Positive results do  not rule out bacterial infection or co-infection with other viruses. If result is PRESUMPTIVE POSTIVE SARS-CoV-2 nucleic acids MAY BE PRESENT.   A presumptive positive result was obtained on the submitted specimen  and confirmed on repeat testing.  While 2019 novel coronavirus  (SARS-CoV-2) nucleic acids may be present in the submitted sample  additional confirmatory testing may be necessary for epidemiological  and / or clinical management purposes  to differentiate between  SARS-CoV-2 and other Sarbecovirus currently known to infect humans.  If clinically indicated additional testing with an  alternate test  methodology [phone])  is advised. The SARS-CoV-2 RNA is generally  detectable in upper and lower respiratory specimens during the acute  phase of infection. The expected result is Negative. Fact Sheet for Patients:  StrictlyIdeas.no Fact Sheet for Healthcare Providers: BankingDealers.co.za This test is not yet approved or cleared by the [place] FDA and has been authorized for detection and/or diagnosis of SARS-CoV-2 by FDA under an Emergency Use Authorization (EUA).  This EUA will remain in effect (meaning this test can be used) for the duration of the COVID-19 declaration under Section 564(b)(1) of the Act, 21 U.S.C. section  360bbb-3(b)(1), unless the authorization is terminated or revoked sooner. Performed at [facility], [place] [address], [place], [place] [address]   C. Diff by PCR, Reflexed     Status: Abnormal   Collection Time: 07/04/19  4:54 AM  Result Value Ref Range Status   Toxigenic C. Difficile by PCR POSITIVE (A) NEGATIVE Final    Comment: Positive for toxigenic C. difficile with little to no toxin production. Only treat if clinical presentation suggests symptomatic illness. Performed at [facility], [place] [address], [place], [place] [address]   Blood culture (routine x 2)     Status: None   Collection Time: 07/04/19  5:20 AM   Specimen: BLOOD  Result Value Ref Range Status   Specimen Description BLOOD LEFT ANTECUBITAL  Final   Special Requests   Final    BOTTLES DRAWN AEROBIC AND ANAEROBIC Blood Culture results may not be optimal due to an excessive volume of blood received in culture bottles   Culture   Final    NO GROWTH 5 DAYS Performed at [facility], [place] [address], [place], [place] [address]    Report Status 07/09/2019 FINAL  Final  Blood culture (routine x 2)     Status: None   Collection Time: 07/04/19  5:24 AM   Specimen: BLOOD LEFT HAND  Result Value Ref Range Status    Specimen Description BLOOD LEFT HAND  Final   Special Requests   Final    BOTTLES DRAWN AEROBIC ONLY Blood Culture adequate volume   Culture   Final    NO GROWTH 5 DAYS Performed at [facility], [place] [address] [place], [place] [address]    Report Status 07/09/2019 FINAL  Final  MRSA PCR Screening     Status: None   Collection Time: 07/07/19 10:22 AM   Specimen: Nasopharyngeal  Result Value Ref Range Status   MRSA by PCR NEGATIVE NEGATIVE Final    Comment:        The GeneXpert MRSA Assay (FDA approved for NASAL specimens only), is one component of a comprehensive MRSA colonization surveillance program. It is not intended to diagnose MRSA infection nor to guide or monitor treatment for MRSA infections. Performed at [facility], [place] [address] [place], [place] [address]     Radiology Reports Ct Head Wo Contrast  Result Date: 07/04/2019 CLINICAL DATA:  Mental status. Fall 2 days ago. Positive COVID-25 May 2019. EXAM: CT HEAD WITHOUT CONTRAST TECHNIQUE: Contiguous axial images were obtained from the base of the skull through the vertex without intravenous contrast. COMPARISON:  None. FINDINGS: Brain: Ventricles, cisterns and other CSF spaces are normal. There is mild chronic ischemic microvascular disease present. There is no mass, mass effect, shift of midline structures or acute hemorrhage. No evidence of acute infarction. Vascular: No hyperdense vessel or unexpected calcification. Skull: Normal. Negative for fracture or focal lesion. Sinuses/Orbits: No acute finding. Other: None. IMPRESSION: No acute findings. Mild chronic ischemic microvascular disease. Electronically Signed   By: Marin Olp M.D.   On: 07/04/2019 07:11   Dg Chest Port 1 View  Result Date: 07/11/2019 CLINICAL DATA:  Evaluate PICC tip. EXAM: PORTABLE CHEST 1 VIEW COMPARISON:  Chest x-ray from same day. FINDINGS: Interval retraction of the right upper extremity PICC line with the  tip now in the distal SVC. Stable cardiomediastinal silhouette. Normal pulmonary vascularity. Continued low lung volumes with mild bibasilar atelectasis, slightly improved. No focal consolidation, pleural effusion, or pneumothorax. No  acute osseous abnormality. IMPRESSION: 1. Right upper extremity PICC line tip now in the distal SVC. 2. Continued low lung volumes with mildly improved bibasilar atelectasis. Electronically Signed   By: Titus Dubin M.D.   On: 07/11/2019 18:41   Dg Chest Port 1 View  Result Date: 07/11/2019 CLINICAL DATA:  PICC placement EXAM: PORTABLE CHEST 1 VIEW COMPARISON:  07/04/2019 FINDINGS: Right arm PICC is been placed. Tip in the mid right atrium. Recommend withdrawal of 3 cm for optimum position Hypoventilation with bibasilar atelectasis.  No effusion or edema. IMPRESSION: PICC tip in the mid right atrium.  Recommend withdrawal 3 cm Hypoventilation with bibasilar atelectasis. Electronically Signed   By: Franchot Gallo M.D.   On: 07/11/2019 17:30   Dg Chest Port 1 View  Result Date: 07/04/2019 CLINICAL DATA:  Altered mental status and hypotension EXAM: PORTABLE CHEST 1 VIEW COMPARISON:  06/03/2019 FINDINGS: Mild cardiomegaly accentuated by low volumes. Otherwise normal mediastinal contours. There is no edema, consolidation, effusion, or pneumothorax. Postoperative proximal left humerus. IMPRESSION: No evidence of acute disease. Electronically Signed   By: Monte Fantasia M.D.   On: 07/04/2019 05:26   Dg Abd Portable 1v  Result Date: 07/11/2019 CLINICAL DATA:  Diarrhea and C diff EXAM: PORTABLE ABDOMEN - 1 VIEW COMPARISON:  None. FINDINGS: Gas-filled loops of colon within normal limits. No dilated loops of Diegel bowel. Gas the rectum. IMPRESSION: Gas-filled colon. No evidence obstruction or inflammation by plain film imaging Electronically Signed   By: Suzy Bouchard M.D.   On: 07/11/2019 11:32   [name] Ekg Site Rite  Result Date: 07/11/2019 If Site Rite image not attached,  placement could not be confirmed due to current cardiac rhythm.

## 2019-07-13 NOTE — Progress Notes (Signed)
  Speech Language Pathology Treatment: Dysphagia  Patient Details Name: GENIEL BORELLI MRN: [id number] DOB: 12-Dec-1950 Today's Date: 07/13/2019 Time: [id number] SLP Time Calculation (min) (ACUTE ONLY): 12 min  Assessment / Plan / Recommendation Clinical Impression  Pt appears clinically similar to last SLP visit. Mastication is prolonged and there are certain more solid foods (salad) that she does not want to try because she says they are too hard to chew given the condition of her dentition. Soft foods, also cut into soft pieces, take moderate amounts of time to chew but ultimately she clears her oral cavity well. She looks best with thin liquids when she takes [place]lin sips. Would continue current diet and precautions with assistance.    HPI HPI: Pt is a 68 yo female presenting with AMS from SNF, where she went last month after lumbar decompression surgery. She tested positive for COVID-19 (of note, pt reportedly tested positive in June; chart indicates positive test 8/18 and nmegative test 8/29). PMH: OSA, HLD, glaucoma, HF, DM, depression, CKD, back pain, anxiety      SLP Plan  Continue with current plan of care       Recommendations  Diet recommendations: Regular;Thin liquid Liquids provided via: Cup;Straw Medication Administration: Whole meds with puree Supervision: Staff to assist with self feeding Compensations: Slow rate;[place] sips/bites Postural Changes and/or Swallow Maneuvers: Seated upright 90 degrees;Upright 30-60 min after meal                Oral Care Recommendations: Oral care BID Follow up Recommendations: Skilled Nursing facility SLP Visit Diagnosis: Dysphagia, unspecified (R13.10) Plan: Continue with current plan of care       GO                Venita Sheffield Savannah Morford 07/13/2019, 3:46 PM  Pollyann Glen, M.A. [place] Acute Environmental education officer [phone] Office [phone]

## 2019-07-13 NOTE — Progress Notes (Signed)
PHARMACIST - PHYSICIAN COMMUNICATION DR:   Candiss Norse CONCERNING: Antibiotic IV to Oral Route Change Policy  RECOMMENDATION: This patient is receiving metronidazole by the intravenous route.  Based on criteria approved by the Pharmacy and Therapeutics Committee, the antibiotic(s) is/are being converted to the equivalent oral dose form(s).   DESCRIPTION: These criteria include:  Patient being treated for a respiratory tract infection, urinary tract infection, cellulitis or clostridium difficile associated diarrhea if on metronidazole  The patient is not neutropenic and does not exhibit a GI malabsorption state  The patient is eating (either orally or via tube) and/or has been taking other orally administered medications for a least 24 hours  The patient is improving clinically and has a Tmax < 100.5  D/w Dr Candiss Norse on 10/6 and he stated ok to change Flagyl to PO 10/7 since she is taking PO meds. Absorption should be great with PO flagyl.  Onnie Boer, PharmD, BCIDP, AAHIVP, CPP Infectious Disease Pharmacist 07/13/2019 9:54 AM

## 2019-07-14 DIAGNOSIS — N179 Acute kidney failure, unspecified: Secondary | ICD-10-CM | POA: Diagnosis not present

## 2019-07-14 DIAGNOSIS — U071 COVID-19: Secondary | ICD-10-CM | POA: Diagnosis not present

## 2019-07-14 DIAGNOSIS — I5032 Chronic diastolic (congestive) heart failure: Secondary | ICD-10-CM | POA: Diagnosis not present

## 2019-07-14 DIAGNOSIS — E039 Hypothyroidism, unspecified: Secondary | ICD-10-CM | POA: Diagnosis not present

## 2019-07-14 LAB — GLUCOSE, CAPILLARY
Glucose-Capillary: 88 mg/dL (ref 70–99)
Glucose-Capillary: 112 mg/dL — ABNORMAL HIGH (ref 70–99)
Glucose-Capillary: 134 mg/dL — ABNORMAL HIGH (ref 70–99)
Glucose-Capillary: 165 mg/dL — ABNORMAL HIGH (ref 70–99)

## 2019-07-14 LAB — CBC
HCT: 27.8 % — ABNORMAL LOW (ref 36.0–46.0)
Hemoglobin: 8.4 g/dL — ABNORMAL LOW (ref 12.0–15.0)
MCH: 27 pg (ref 26.0–34.0)
MCHC: 30.2 g/dL (ref 30.0–36.0)
MCV: 89.4 fL (ref 80.0–100.0)
Platelets: 314 10*3/uL (ref 150–400)
RBC: 3.11 MIL/uL — ABNORMAL LOW (ref 3.87–5.11)
RDW: 17.3 % — ABNORMAL HIGH (ref 11.5–15.5)
WBC: 8.8 10*3/uL (ref 4.0–10.5)
nRBC: 0 % (ref 0.0–0.2)

## 2019-07-14 LAB — BASIC METABOLIC PANEL
Anion gap: 7 (ref 5–15)
BUN: 45 mg/dL — ABNORMAL HIGH (ref 8–23)
CO2: 23 mmol/L (ref 22–32)
Calcium: 6.9 mg/dL — ABNORMAL LOW (ref 8.9–10.3)
Chloride: 104 mmol/L (ref 98–111)
Creatinine, Ser: 1.18 mg/dL — ABNORMAL HIGH (ref 0.44–1.00)
GFR calc Af Amer: 55 mL/min — ABNORMAL LOW (ref >60)
GFR calc non Af Amer: 47 mL/min — ABNORMAL LOW (ref >60)
Glucose, Bld: 82 mg/dL (ref 70–99)
Potassium: 3.4 mmol/L — ABNORMAL LOW (ref 3.5–5.1)
Sodium: 134 mmol/L — ABNORMAL LOW (ref 135–145)

## 2019-07-14 MED ORDER — POTASSIUM CHLORIDE IN NACL 20-0.9 MEQ/L-% IV SOLN
INTRAVENOUS | Status: AC
  Administered 2019-07-14: 22:00:00 via INTRAVENOUS
  Filled 2019-07-14: qty 1000

## 2019-07-14 MED ORDER — POTASSIUM CHLORIDE CRYS ER 20 MEQ PO TBCR
40.0000 meq | EXTENDED_RELEASE_TABLET | Freq: Once | ORAL | Status: AC
  Administered 2019-07-14: 11:00:00 40 meq via ORAL
  Filled 2019-07-14: qty 2

## 2019-07-14 MED ORDER — ADULT MULTIVITAMIN W/MINERALS CH
1.0000 | ORAL_TABLET | Freq: Every day | ORAL | Status: DC
  Administered 2019-07-14 – 2019-08-01 (×17): 1 via ORAL
  Filled 2019-07-14 (×19): qty 1

## 2019-07-14 MED ORDER — ENSURE ENLIVE PO LIQD
237.0000 mL | Freq: Three times a day (TID) | ORAL | Status: DC
  Administered 2019-07-14 – 2019-08-04 (×26): 237 mL via ORAL

## 2019-07-14 NOTE — Progress Notes (Signed)
  Speech Language Pathology Treatment: Dysphagia  Patient Details Name: Alice Kemp MRN: [id number] DOB: December 11, 1950 Today's Date: 07/14/2019 Time: [id number] SLP Time Calculation (min) (ACUTE ONLY): 13 min  Assessment / Plan / Recommendation Clinical Impression  Pt was agreeable to try only the thin liquids on her meal tray, and she drank them with no overt s/s of aspiration. She needs Min cues and encouragement for use of safe swallowing precautions - particularly keeping her HOB elevated. She said she didn't want the food because it was too hard, but did not want her food to come "ground up." I think mechanical soft may be a nice middle ground. We discussed consistencies on this diet and she would like to try it to allow for more ease of mastication. Will continue to follow acutely.   HPI HPI: Pt is a 68 yo female presenting with AMS from SNF, where she went last month after lumbar decompression surgery. She tested positive for COVID-19 (of note, pt reportedly tested positive in June; chart indicates positive test 8/18 and nmegative test 8/29). PMH: OSA, HLD, glaucoma, HF, DM, depression, CKD, back pain, anxiety      SLP Plan  Continue with current plan of care       Recommendations  Diet recommendations: Dysphagia 3 (mechanical soft);Thin liquid Liquids provided via: Cup;Straw Medication Administration: Whole meds with puree Supervision: Staff to assist with self feeding Compensations: Slow rate;[place] sips/bites Postural Changes and/or Swallow Maneuvers: Seated upright 90 degrees;Upright 30-60 min after meal                Oral Care Recommendations: Oral care BID Follow up Recommendations: Skilled Nursing facility SLP Visit Diagnosis: Dysphagia, unspecified (R13.10) Plan: Continue with current plan of care       GO                Venita Sheffield Raye Wiens 07/14/2019, 10:18 AM  Pollyann Glen, M.A. [place] Acute Environmental education officer [phone] Office  [phone]

## 2019-07-14 NOTE — Progress Notes (Signed)
Fecal Management System discontinued per MD order. Will monitor patient output as well as stage 2 on sacrum.

## 2019-07-14 NOTE — Progress Notes (Signed)
Initial Nutrition Assessment RD working remotely.  DOCUMENTATION CODES:   Morbid obesity  INTERVENTION:    Ensure Enlive po TID, each supplement provides 350 kcal and 20 grams of protein.   Multivitamin with minerals daily.   Pt receiving Hormel Shake daily with Breakfast which provides 520 kcals and 22 g of protein and Magic cup BID with lunch and dinner, each supplement provides 290 kcal and 9 grams of protein, automatically on meal trays to optimize nutritional intake.   NUTRITION DIAGNOSIS:   Inadequate oral intake related to acute illness(with poor appetite and difficulty chewing) as evidenced by meal completion < 50%.  GOAL:   Patient will meet greater than or equal to 90% of their needs  MONITOR:   PO intake, Supplement acceptance, Labs, Skin  REASON FOR ASSESSMENT:   LOS    ASSESSMENT:   68 yo female admitted from SNF S/P fall, found to have C. Diff colitis. S/P back surgery in August, when she was incidentally found to have COVID-19. PMH includes hypothyroidism, morbid obesity, OSA, HLD, glaucoma, HF, DM, CKD III.   Patient with profuse diarrhea on admission, now improving. Per review of usual weights, she has lost 5% of usual weight within the past 1.5 months. However, current weight is the same as weight from 7 months ago.  SLP following for difficulty chewing. Patient has been consuming 0-50% of meals. She only wanted to drink the liquids from her meal tray at breakfast today; says the food is too hard. Diet was downgraded to dysphagia 3 (mechanical soft) with thin liquids this morning for foods that are easier to chew.     Labs reviewed. Sodium 134 (L), potassium 3.4 (L) CBG's: 130-105-127-88  Medications reviewed and include novolog, lantus, flagyl, vancomycin.   NUTRITION - FOCUSED PHYSICAL EXAM:  unable to complete  Diet Order:   Diet Order            DIET DYS 3 Room service appropriate? Yes; Fluid consistency: Thin  Diet effective now               EDUCATION NEEDS:   Not appropriate for education at this time  Skin:  Skin Assessment: Skin Integrity Issues: Skin Integrity Issues:: Stage II, Other (Comment) Stage II: coccyx Other: MASD to buttocks, vagina  Last BM:  10/8 type 7  Height:   Ht Readings from Last 1 Encounters:  07/04/19 5' 4.02" (1.626 m)    Weight:   Wt Readings from Last 1 Encounters:  07/14/19 112.1 kg    Ideal Body Weight:  54.5 kg  BMI:  Body mass index is 42.4 kg/m.  Estimated Nutritional Needs:   Kcal:  1800-2000  Protein:  115-130 gm  Fluid:  >/= 1.8 L    Molli Barrows, RD, LDN, CNSC Pager [phone] After Hours Pager [phone]

## 2019-07-14 NOTE — Progress Notes (Addendum)
PROGRESS NOTE                                                                                                                                                                                                             Patient Demographics:    Alice Kemp, is a 68 y.o. female, DOB - 1951/01/15, [id number]  Outpatient Primary MD for the patient is [name], MD   Admit date - 07/04/2019   LOS - 69  Chief Complaint  Patient presents with   Fall       Brief Narrative: Patient is a 68 y.o. female with PMHx of hypothyroidism, morbid obesity, dyslipidemia, DM, chronic diastolic heart failure, CKD stage III who was hospitalized in August for spinal stenosis-she underwent a lumbar laminectomy-she was incidentally found to have COVID-19 during that hospitalization.  She presented to the ED for evaluation of fall along with profuse diarrhea-she was subsequently found to have C. difficile colitis along with severe dehydration and AKI.   Subjective:    Alice Kemp today feels better-diarrhea has improved-she complains of pain in her perirectal area from the Flexi-Seal.  Still with some amount of loose stools but no longer watery.   Assessment  & Plan :   Severe C. difficile colitis: Diarrhea continues to improve-continue vancomycin and Flexi-Seal.  Abdominal exam is benign.  Thankfully no fever or leukocytosis.   AKI: Secondary to hemodynamically mediated kidney injury in the setting of diarrhea.  Improved with supportive care.  Hypotension: Secondary to volume loss due to diarrhea-BP improved but still soft-continue midodrine-we will taper off over the next few days.    Hypokalemia: Replete and recheck.  COVID-19 infection: Essentially asymptomatic-supportive care  Depression: Stable-continue amitriptyline, Wellbutrin and Cymbalta  Hypothyroidism: Continue Synthroid   Chronic diastolic heart failure: Volume status  stable-hold diuretics till diarrhea improves further.  Hypertension: Given hypotension-continue to hold all antihypertensives.  DM-2: CBGs stable-continue Lantus 10 units and SSI.  CBG (last 3)  Recent Labs    07/13/19 2043 07/14/19 0804 07/14/19 1119  GLUCAP 127* 88 112*    Dyslipidemia: Continue with Synthroid  Stage II sacral decubitus ulcer: Continue per wound care.   Recent L-spine spinal stenosis surgery last month on 05/30/2019 by Dr. Saintclair Halsted.  Supportive care.   Obesity: Estimated body mass index is 42.4 kg/m as calculated from the following:  Height as of this encounter: 5' 4.02" (1.626 m).   Weight as of this encounter: 112.1 kg.   Debility/deconditioning: Significant worsening than baseline due to severe C. difficile infection associated debility-PT/OT following  ABG:    Component Value Date/Time   PHART 7.420 (H) 01/28/2011 1502   PCO2ART 39.8 01/28/2011 1502   PO2ART 68.0 (L) 01/28/2011 1502   HCO3 25.8 (H) 01/28/2011 1502   TCO2 27 01/28/2011 1502   ACIDBASEDEF 2.0 01/28/2011 1453   O2SAT 94.0 01/28/2011 1502    Vent Settings: N/A   Condition - Stable  Family Communication  : None at bedside-we will reach out to patient's mother over the next few days.  Code Status :  DNR  Diet :  Diet Order            DIET DYS 3 Room service appropriate? Yes; Fluid consistency: Thin  Diet effective now               Disposition Plan  :  Remain hospitalized-SNF versus home health services on discharge  Barriers to discharge: Ongoing diarrhea  Consults  :  None  Procedures  :  None  Antibiotics  :    Anti-infectives (From admission, onward)   Start     Dose/Rate Route Frequency Ordered Stop   07/13/19 1000  metroNIDAZOLE (FLAGYL) tablet 500 mg     500 mg Oral Every 8 hours 07/13/19 0952     07/12/19 0400  metroNIDAZOLE (FLAGYL) IVPB 500 mg  Status:  Discontinued     500 mg 100 mL/hr over 60 Minutes Intravenous Every 8 hours 07/11/19 1935 07/13/19  0952   07/11/19 1200  metroNIDAZOLE (FLAGYL) IVPB 500 mg  Status:  Discontinued     500 mg 100 mL/hr over 60 Minutes Intravenous Every 8 hours 07/11/19 1055 07/11/19 1935   07/09/19 2130  vancomycin (VANCOCIN) 50 mg/mL oral solution 500 mg  Status:  Discontinued     500 mg Oral Every 6 hours 07/08/19 2104 07/08/19 2146   07/08/19 2200  vancomycin (VANCOCIN) 50 mg/mL oral solution 500 mg     500 mg Oral Every 6 hours 07/08/19 2146     07/05/19 1600  remdesivir 100 mg in sodium chloride 0.9 % 250 mL IVPB  Status:  Discontinued     100 mg 500 mL/hr over 30 Minutes Intravenous Every 24 hours 07/04/19 1517 07/05/19 0734   07/04/19 1600  remdesivir 200 mg in sodium chloride 0.9 % 250 mL IVPB     200 mg 500 mL/hr over 30 Minutes Intravenous Once 07/04/19 1517 07/04/19 1941   07/04/19 1400  vancomycin (VANCOCIN) 50 mg/mL oral solution 500 mg  Status:  Discontinued     500 mg Oral Every 6 hours 07/04/19 1134 07/08/19 2104      Inpatient Medications  Scheduled Meds:  aspirin  81 mg Oral Daily   buPROPion  100 mg Oral BID   Chlorhexidine Gluconate Cloth  6 each Topical Daily   DULoxetine  30 mg Oral BID   enoxaparin (LOVENOX) injection  40 mg Subcutaneous Q24H   feeding supplement (ENSURE ENLIVE)  237 mL Oral TID BM   feeding supplement (PRO-STAT SUGAR FREE 64)  30 mL Oral TID WC   gabapentin  300 mg Oral TID   Gerhardt's butt cream  1 application Topical TID   insulin aspart  0-15 Units Subcutaneous TID WC   insulin aspart  0-5 Units Subcutaneous QHS   insulin glargine  10 Units Subcutaneous Daily  levothyroxine  88 mcg Oral QAC breakfast   metroNIDAZOLE  500 mg Oral Q8H   midodrine  10 mg Oral TID WC   multivitamin with minerals  1 tablet Oral Daily   rosuvastatin  5 mg Oral Daily   sodium chloride flush  10-40 mL Intracatheter Q12H   vancomycin  500 mg Oral Q6H   Continuous Infusions:  PRN Meds:.acetaminophen, magic mouthwash w/lidocaine, [DISCONTINUED]  ondansetron **OR** ondansetron (ZOFRAN) IV   Time Spent in minutes  25  See all Orders from today for further details   Oren Binet M.D on 07/14/2019 at 11:28 AM  To page go to [email or web address] - use universal password  [facility] -  Office  [phone]    Objective:   Vitals:   07/14/19 0425 07/14/19 0427 07/14/19 0754 07/14/19 1100  BP:  104/66 (!) 110/55 (!) 100/50  Pulse:  88 80 96  Resp:  16 16 16   Temp:  99 F (37.2 C) 97.9 F (36.6 C) 98.7 F (37.1 C)  TempSrc:  Oral Oral Oral  SpO2:  95% 97% 97%  Weight: 112.1 kg     Height:        Wt Readings from Last 3 Encounters:  07/14/19 112.1 kg  06/05/19 117.8 kg  12/14/18 112.5 kg     Intake/Output Summary (Last 24 hours) at 07/14/2019 1128 Last data filed at 07/14/2019 1116 Gross per 24 hour  Intake 3003.78 ml  Output 1650 ml  Net 1353.78 ml     Physical Exam Gen Exam:Alert awake-not in any distress HEENT:atraumatic, normocephalic Chest: B/L clear to auscultation anteriorly CVS:S1S2 regular Abdomen:soft non tender, non distended Extremities:no edema Neurology: Non focal Skin: no rash  Data Review:    CBC Recent Labs  Lab 07/08/19 0227 07/10/19 0414 07/11/19 0420 07/12/19 0500 07/12/19 1002 07/12/19 1312 07/13/19 0400 07/14/19 0616  WBC 10.3 13.0* 9.9 11.4*  --   --  10.5 8.8  HGB 10.1* 9.0* 8.8* 7.1* 9.0* 8.9* 8.9* 8.4*  HCT 33.0* 30.2* 29.1* 23.4* 29.0* 29.4* 29.4* 27.8*  PLT 426* 404* 364 290  --   --  346 314  MCV 90.2 90.7 90.1 90.7  --   --  89.9 89.4  MCH 27.6 27.0 27.2 27.5  --   --  27.2 27.0  MCHC 30.6 29.8* 30.2 30.3  --   --  30.3 30.2  RDW 16.9* 17.0* 16.9* 17.2*  --   --  17.2* 17.3*  LYMPHSABS 0.8 3.3 2.6 3.4  --   --  3.6  --   MONOABS 0.4 1.2* 0.7 1.0  --   --  1.0  --   EOSABS 0.2 0.2 0.2 0.1  --   --  0.2  --   BASOSABS 0.0 0.0 0.0 0.0  --   --  0.0  --     Chemistries  Recent Labs  Lab 07/08/19 0227 07/10/19 0414 07/11/19 0420 07/12/19 0500  07/13/19 0400 07/14/19 0616  NA 134* 136 132* 132* 134* 134*  K 4.0 3.9 3.7 4.0 3.5 3.4*  CL 102 105 102 101 102 104  CO2 23 23 21* 24 24 23   GLUCOSE 186* 136* 149* 175* 122* 82  BUN 73* 90* 80* 73* 56* 45*  CREATININE 1.61* 2.09* 1.72* 1.50* 1.21* 1.18*  CALCIUM 7.8* 7.3* 7.3* 6.9* 7.0* 6.9*  MG 1.7 1.8 1.7 3.2* 1.7  --   AST 18 37 54* 61* 43*  --   ALT 13 18 22 27 26   --  ALKPHOS 148* 198* 292* 358* 335*  --   BILITOT 0.3 0.5 0.3 0.6 0.5  --    ------------------------------------------------------------------------------------------------------------------ No results for input(s): CHOL, HDL, LDLCALC, TRIG, CHOLHDL, LDLDIRECT in the last 72 hours.  Lab Results  Component Value Date   HGBA1C 8.9 (H) 05/24/2019   ------------------------------------------------------------------------------------------------------------------ No results for input(s): TSH, T4TOTAL, T3FREE, THYROIDAB in the last 72 hours.  Invalid input(s): FREET3 ------------------------------------------------------------------------------------------------------------------ No results for input(s): VITAMINB12, FOLATE, FERRITIN, TIBC, IRON, RETICCTPCT in the last 72 hours.  Coagulation profile No results for input(s): INR, PROTIME in the last 168 hours.  No results for input(s): DDIMER in the last 72 hours.  Cardiac Enzymes No results for input(s): CKMB, TROPONINI, MYOGLOBIN in the last 168 hours.  Invalid input(s): CK ------------------------------------------------------------------------------------------------------------------    Component Value Date/Time   BNP 148.7 (H) 07/13/2019 0400    Micro Results Recent Results (from the past 240 hour(s))  MRSA PCR Screening     Status: None   Collection Time: 07/07/19 10:22 AM   Specimen: Nasopharyngeal  Result Value Ref Range Status   MRSA by PCR NEGATIVE NEGATIVE Final    Comment:        The GeneXpert MRSA Assay (FDA approved for NASAL  specimens only), is one component of a comprehensive MRSA colonization surveillance program. It is not intended to diagnose MRSA infection nor to guide or monitor treatment for MRSA infections. Performed at [facility], [place] [place] [address] [place], [place] [address]     Radiology Reports Ct Head Wo Contrast  Result Date: 07/04/2019 CLINICAL DATA:  Mental status. Fall 2 days ago. Positive COVID-25 May 2019. EXAM: CT HEAD WITHOUT CONTRAST TECHNIQUE: Contiguous axial images were obtained from the base of the skull through the vertex without intravenous contrast. COMPARISON:  None. FINDINGS: Brain: Ventricles, cisterns and other CSF spaces are normal. There is mild chronic ischemic microvascular disease present. There is no mass, mass effect, shift of midline structures or acute hemorrhage. No evidence of acute infarction. Vascular: No hyperdense vessel or unexpected calcification. Skull: Normal. Negative for fracture or focal lesion. Sinuses/Orbits: No acute finding. Other: None. IMPRESSION: No acute findings. Mild chronic ischemic microvascular disease. Electronically Signed   By: Marin Olp M.D.   On: 07/04/2019 07:11   Dg Chest Port 1 View  Result Date: 07/11/2019 CLINICAL DATA:  Evaluate PICC tip. EXAM: PORTABLE CHEST 1 VIEW COMPARISON:  Chest x-ray from same day. FINDINGS: Interval retraction of the right upper extremity PICC line with the tip now in the distal SVC. Stable cardiomediastinal silhouette. Normal pulmonary vascularity. Continued low lung volumes with mild bibasilar atelectasis, slightly improved. No focal consolidation, pleural effusion, or pneumothorax. No acute osseous abnormality. IMPRESSION: 1. Right upper extremity PICC line tip now in the distal SVC. 2. Continued low lung volumes with mildly improved bibasilar atelectasis. Electronically Signed   By: Titus Dubin M.D.   On: 07/11/2019 18:41   Dg Chest Port 1 View  Result Date: 07/11/2019 CLINICAL  DATA:  PICC placement EXAM: PORTABLE CHEST 1 VIEW COMPARISON:  07/04/2019 FINDINGS: Right arm PICC is been placed. Tip in the mid right atrium. Recommend withdrawal of 3 cm for optimum position Hypoventilation with bibasilar atelectasis.  No effusion or edema. IMPRESSION: PICC tip in the mid right atrium.  Recommend withdrawal 3 cm Hypoventilation with bibasilar atelectasis. Electronically Signed   By: Franchot Gallo M.D.   On: 07/11/2019 17:30   Dg Chest Port 1 View  Result Date: 07/04/2019 CLINICAL DATA:  Altered mental status  and hypotension EXAM: PORTABLE CHEST 1 VIEW COMPARISON:  06/03/2019 FINDINGS: Mild cardiomegaly accentuated by low volumes. Otherwise normal mediastinal contours. There is no edema, consolidation, effusion, or pneumothorax. Postoperative proximal left humerus. IMPRESSION: No evidence of acute disease. Electronically Signed   By: Monte Fantasia M.D.   On: 07/04/2019 05:26   Dg Abd Portable 1v  Result Date: 07/11/2019 CLINICAL DATA:  Diarrhea and C diff EXAM: PORTABLE ABDOMEN - 1 VIEW COMPARISON:  None. FINDINGS: Gas-filled loops of colon within normal limits. No dilated loops of Gosdin bowel. Gas the rectum. IMPRESSION: Gas-filled colon. No evidence obstruction or inflammation by plain film imaging Electronically Signed   By: Suzy Bouchard M.D.   On: 07/11/2019 11:32   [name] Ekg Site Rite  Result Date: 07/11/2019 If Site Rite image not attached, placement could not be confirmed due to current cardiac rhythm.

## 2019-07-14 NOTE — Progress Notes (Signed)
Patient remains on RA. Q2H turns along with bariatric redistribution mattress to reduce and prevent skin break down.  Patient refused 2200 and 0600 medications. Patient educated on importance to taking medications. Patient states "I will take them later". Patient educated on importance of taking medications when scheduled. Patient continues to refuse medications when offered at a later time. Patient did take 0000 dose of oral vacomycin. Patient A/O x4 with intermittent confusion, easily reoriented. Patient spoke with mother on phone while nurse was present, no questions at present time. Will continue with POC.

## 2019-07-14 NOTE — Progress Notes (Signed)
Patient's mother, [name], called and updated on patient status. Questions encouraged and answered.

## 2019-07-15 DIAGNOSIS — N179 Acute kidney failure, unspecified: Secondary | ICD-10-CM | POA: Diagnosis not present

## 2019-07-15 DIAGNOSIS — U071 COVID-19: Secondary | ICD-10-CM | POA: Diagnosis not present

## 2019-07-15 DIAGNOSIS — I5032 Chronic diastolic (congestive) heart failure: Secondary | ICD-10-CM | POA: Diagnosis not present

## 2019-07-15 DIAGNOSIS — E039 Hypothyroidism, unspecified: Secondary | ICD-10-CM | POA: Diagnosis not present

## 2019-07-15 LAB — GLUCOSE, CAPILLARY
Glucose-Capillary: 154 mg/dL — ABNORMAL HIGH (ref 70–99)
Glucose-Capillary: 187 mg/dL — ABNORMAL HIGH (ref 70–99)
Glucose-Capillary: 195 mg/dL — ABNORMAL HIGH (ref 70–99)
Glucose-Capillary: 164 mg/dL — ABNORMAL HIGH (ref 70–99)

## 2019-07-15 LAB — COMPREHENSIVE METABOLIC PANEL
ALT: 20 U/L (ref 0–44)
AST: 35 U/L (ref 15–41)
Albumin: 1.3 g/dL — ABNORMAL LOW (ref 3.5–5.0)
Alkaline Phosphatase: 349 U/L — ABNORMAL HIGH (ref 38–126)
Anion gap: 9 (ref 5–15)
BUN: 37 mg/dL — ABNORMAL HIGH (ref 8–23)
CO2: 24 mmol/L (ref 22–32)
Calcium: 7.1 mg/dL — ABNORMAL LOW (ref 8.9–10.3)
Chloride: 105 mmol/L (ref 98–111)
Creatinine, Ser: 0.91 mg/dL (ref 0.44–1.00)
GFR calc Af Amer: >60 mL/min (ref >60)
GFR calc non Af Amer: >60 mL/min (ref >60)
Glucose, Bld: 138 mg/dL — ABNORMAL HIGH (ref 70–99)
Potassium: 3.6 mmol/L (ref 3.5–5.1)
Sodium: 138 mmol/L (ref 135–145)
Total Bilirubin: 0.3 mg/dL (ref 0.3–1.2)
Total Protein: 4.6 g/dL — ABNORMAL LOW (ref 6.5–8.1)

## 2019-07-15 LAB — CBC
HCT: 27.2 % — ABNORMAL LOW (ref 36.0–46.0)
Hemoglobin: 8.2 g/dL — ABNORMAL LOW (ref 12.0–15.0)
MCH: 27.5 pg (ref 26.0–34.0)
MCHC: 30.1 g/dL (ref 30.0–36.0)
MCV: 91.3 fL (ref 80.0–100.0)
Platelets: 280 10*3/uL (ref 150–400)
RBC: 2.98 MIL/uL — ABNORMAL LOW (ref 3.87–5.11)
RDW: 17.4 % — ABNORMAL HIGH (ref 11.5–15.5)
WBC: 8.6 10*3/uL (ref 4.0–10.5)
nRBC: 0 % (ref 0.0–0.2)

## 2019-07-15 LAB — C-REACTIVE PROTEIN: CRP: 2.9 mg/dL — ABNORMAL HIGH (ref <1.0)

## 2019-07-15 LAB — FERRITIN: Ferritin: 141 ng/mL (ref 11–307)

## 2019-07-15 MED ORDER — FUROSEMIDE 10 MG/ML IJ SOLN
20.0000 mg | Freq: Once | INTRAMUSCULAR | Status: AC
  Administered 2019-07-15: 20 mg via INTRAVENOUS
  Filled 2019-07-15: qty 2

## 2019-07-15 MED ORDER — POTASSIUM CHLORIDE CRYS ER 20 MEQ PO TBCR
40.0000 meq | EXTENDED_RELEASE_TABLET | Freq: Once | ORAL | Status: AC
  Administered 2019-07-15: 40 meq via ORAL
  Filled 2019-07-15: qty 2

## 2019-07-15 MED ORDER — CHOLESTYRAMINE LIGHT 4 G PO PACK
4.0000 g | PACK | Freq: Every day | ORAL | Status: AC
  Administered 2019-07-15 – 2019-07-16 (×2): 4 g via ORAL
  Filled 2019-07-15 (×2): qty 1

## 2019-07-15 MED ORDER — METRONIDAZOLE IN NACL 5-0.79 MG/ML-% IV SOLN
500.0000 mg | Freq: Three times a day (TID) | INTRAVENOUS | Status: DC
  Administered 2019-07-15 – 2019-07-24 (×29): 500 mg via INTRAVENOUS
  Filled 2019-07-15 (×31): qty 100

## 2019-07-15 NOTE — Progress Notes (Signed)
Physical Therapy Treatment Patient Details Name: Alice Kemp MRN: [id number] DOB: [birth year] Today's Date: 07/15/2019    History of Present Illness 68 y/o female pt w/ hx of anxiety, arthritis, back pain, benign neoplasm of colon, breast abscess, cervical radiculopathy, CKD III, depression, diastolic HF, DM II, glaucoma, gout, HLD, morbid obesity, obstructive sleep apnea, pericardial effusion, pulmonary HTN, hx of COVID in 06/20. pt underwent decompressive laminectomy L3 partial inferior laminectomy L2 and microscopic discectomy from the left and foraminotomies of the left L3 nerve root on 05/30/19. She had a fall on 07/02/2019, admitted with AMS, hypotension; C-diff.     PT Comments    The patient is very alert, participating in mobility, exercises in recliner. Patient's BP sitting up 106/58. Continue PT and mobility as tolerated.    Follow Up Recommendations  SNF     Equipment Recommendations  None recommended by PT    Recommendations for Other Services       Precautions / Restrictions Precautions Precautions: Fall Precaution Comments: incontinence, low BP    Mobility  Bed Mobility   Bed Mobility: Rolling Rolling: +2 for physical assistance;Mod assist         General bed mobility comments: rolling to each side , use of rail, assist with leg forward.  Transfers                 General transfer comment: OOB via maximove  Ambulation/Gait                 Stairs             Wheelchair Mobility    Modified Rankin (Stroke Patients Only)       Balance       Sitting balance - Comments: while in recliner, sat forward pulling on armrests 10 x's x 2 sets. Able to hold sitting forward brieflt.                                    Cognition Arousal/Alertness: Awake/alert Behavior During Therapy: WFL for tasks assessed/performed Overall Cognitive Status: Impaired/Different from baseline                   Orientation Level:  Disoriented to;Time Current Attention Level: Focused       Awareness: Emergent Problem Solving: Slow processing General Comments: patient is very alert, asking ablout her BP aand how the medication is affecting her kidneys.      Exercises General Exercises - Lower Extremity Long Arc Quad: AROM;Both;20 reps;Seated Hip Flexion/Marching: AAROM;Both;20 reps;Seated    General Comments        Pertinent Vitals/Pain Faces Pain Scale: Hurts even more Pain Location: sacral area sitting in recliner Pain Descriptors / Indicators: Discomfort;Grimacing Pain Intervention(s): Repositioned;Monitored during session    Home Living                      Prior Function            PT Goals (current goals can now be found in the care plan section) Progress towards PT goals: Progressing toward goals    Frequency    Min 2X/week      PT Plan Current plan remains appropriate    Co-evaluation              AM-PAC PT "6 Clicks" Mobility   Outcome Measure  Help needed turning from your back to your side while  in a flat bed without using bedrails?: A Lot Help needed moving from lying on your back to sitting on the side of a flat bed without using bedrails?: Total Help needed moving to and from a bed to a chair (including a wheelchair)?: Total Help needed standing up from a chair using your arms (e.g., wheelchair or bedside chair)?: Total Help needed to walk in hospital room?: Total Help needed climbing 3-5 steps with a railing? : Total 6 Click Score: 7    End of Session   Activity Tolerance: Patient tolerated treatment well Patient left: in chair;with call bell/phone within reach Nurse Communication: Mobility status;Need for lift equipment PT Visit Diagnosis: Other abnormalities of gait and mobility (R26.89);History of falling (Z91.81)     Time: [id number] PT Time Calculation (min) (ACUTE ONLY): 26 min  Charges:  $Therapeutic Exercise: 8-22 mins $Therapeutic  Activity: 8-22 mins                     Tresa Endo PT Acute Rehabilitation Services Pager [phone] Office [phone]    [name] 07/15/2019, 4:50 PM

## 2019-07-15 NOTE — Progress Notes (Signed)
CSW received call from [employer] requesting peer-to-peer; Dr. Amalia Hailey number is [phone]- number provided to MD to complete.  Kingsley Spittle, LCSW [facility]  [phone]

## 2019-07-15 NOTE — Progress Notes (Signed)
pts mother called and updated all questions answered.

## 2019-07-15 NOTE — Progress Notes (Signed)
PROGRESS NOTE                                                                                                                                                                                                             Patient Demographics:    Alice Kemp, is a 68 y.o. female, DOB - 12/15/1950, UD:[id number]  Outpatient Primary MD for the patient is [name], MD   Admit date - 07/04/2019   LOS - 5  Chief Complaint  Patient presents with  . Fall       Brief Narrative: Patient is a 68 y.o. female with PMHx of hypothyroidism, morbid obesity, dyslipidemia, DM, chronic diastolic heart failure, CKD stage III who was hospitalized in August for spinal stenosis-she underwent a lumbar laminectomy-she was incidentally found to have COVID-19 during that hospitalization.  She presented to the ED for evaluation of fall along with profuse diarrhea-she was subsequently found to have C. difficile colitis along with severe dehydration and AKI.   Subjective:    Alice Kemp although improved-diarrhea has slowed down but still had approximately around 5 loose stools overnight.   Assessment  & Plan :   Severe C. difficile colitis: Although improved-continues to have diarrhea.  Continue p.o. vancomycin and IV Flagyl.  Patient wanted the Flexi-Seal discontinued yesterday due to severe pain.  Will try a few dose of Questran to see if we can buff up stools.  She unfortunately has now a stage II sacral ulcer-that probably will worsen given and ongoing diarrhea.  AKI: Secondary to hemodynamically mediated kidney injury in the setting of diarrhea.  Improved with supportive care.  Hypotension: Secondary to volume loss due to diarrhea-BP improved but still soft-continue midodrine-we will taper off over the next few days.    Hypokalemia: Repleted.  COVID-19 infection: Essentially asymptomatic-supportive care  Depression: Stable-continue  amitriptyline, Wellbutrin and Cymbalta  Hypothyroidism: Continue Synthroid   Chronic diastolic heart failure: Has lower extremity edema-we will give 1 dose of IV Lasix today.  Hypertension: Given hypotension-continue to hold all antihypertensives.  DM-2: CBGs stable-continue Lantus 10 units and SSI.  CBG (last 3)  Recent Labs    07/14/19 2140 07/15/19 0748 07/15/19 1126  GLUCAP 165* 154* 187*    Dyslipidemia: Continue with Synthroid  Stage II sacral decubitus ulcer: Continue per wound care.   Recent L-spine spinal stenosis  surgery last month on 05/30/2019 by Dr. Saintclair Halsted.    Per patient-while at [facility] after her discharge from the hospital in August-she was slowly improving and was just about able to get up with assistance and ambulate a few steps with assistance.   Obesity: Estimated body mass index is 42.74 kg/m as calculated from the following:   Height as of this encounter: 5' 4.02" (1.626 m).   Weight as of this encounter: 113 kg.   Debility/deconditioning: Sounds like she has chronic debility at baseline from recent surgery/spondylolisthesis-but it seems like she has worsened after some improvement in function after her recent stay at SNF.  Suspect worsening is from acute illness associated debility/deconditioning due to severe C. difficile colitis.  ABG:    Component Value Date/Time   PHART 7.420 (H) 01/28/2011 1502   PCO2ART 39.8 01/28/2011 1502   PO2ART 68.0 (L) 01/28/2011 1502   HCO3 25.8 (H) 01/28/2011 1502   TCO2 27 01/28/2011 1502   ACIDBASEDEF 2.0 01/28/2011 1453   O2SAT 94.0 01/28/2011 1502    Vent Settings: N/A   Condition - Stable  Family Communication  : Called patient's mother-unable to leave voicemail as mailbox full.    Code Status :  DNR  Diet :  Diet Order            DIET DYS 3 Room service appropriate? Yes; Fluid consistency: Thin  Diet effective now               Disposition Plan  :  Remain hospitalized-SNF versus home health services on  discharge  Barriers to discharge: Ongoing diarrhea  Consults  :  None  Procedures  :  None  Antibiotics  :    Anti-infectives (From admission, onward)   Start     Dose/Rate Route Frequency Ordered Stop   07/15/19 1400  metroNIDAZOLE (FLAGYL) IVPB 500 mg     500 mg 100 mL/hr over 60 Minutes Intravenous Every 8 hours 07/15/19 0953     07/13/19 1000  metroNIDAZOLE (FLAGYL) tablet 500 mg  Status:  Discontinued     500 mg Oral Every 8 hours 07/13/19 0952 07/15/19 0953   07/12/19 0400  metroNIDAZOLE (FLAGYL) IVPB 500 mg  Status:  Discontinued     500 mg 100 mL/hr over 60 Minutes Intravenous Every 8 hours 07/11/19 1935 07/13/19 0952   07/11/19 1200  metroNIDAZOLE (FLAGYL) IVPB 500 mg  Status:  Discontinued     500 mg 100 mL/hr over 60 Minutes Intravenous Every 8 hours 07/11/19 1055 07/11/19 1935   07/09/19 2130  vancomycin (VANCOCIN) 50 mg/mL oral solution 500 mg  Status:  Discontinued     500 mg Oral Every 6 hours 07/08/19 2104 07/08/19 2146   07/08/19 2200  vancomycin (VANCOCIN) 50 mg/mL oral solution 500 mg     500 mg Oral Every 6 hours 07/08/19 2146     07/05/19 1600  remdesivir 100 mg in sodium chloride 0.9 % 250 mL IVPB  Status:  Discontinued     100 mg 500 mL/hr over 30 Minutes Intravenous Every 24 hours 07/04/19 1517 07/05/19 0734   07/04/19 1600  remdesivir 200 mg in sodium chloride 0.9 % 250 mL IVPB     200 mg 500 mL/hr over 30 Minutes Intravenous Once 07/04/19 1517 07/04/19 1941   07/04/19 1400  vancomycin (VANCOCIN) 50 mg/mL oral solution 500 mg  Status:  Discontinued     500 mg Oral Every 6 hours 07/04/19 1134 07/08/19 2104      Inpatient  Medications  Scheduled Meds: . aspirin  81 mg Oral Daily  . buPROPion  100 mg Oral BID  . Chlorhexidine Gluconate Cloth  6 each Topical Daily  . cholestyramine light  4 g Oral Daily  . DULoxetine  30 mg Oral BID  . enoxaparin (LOVENOX) injection  40 mg Subcutaneous Q24H  . feeding supplement (ENSURE ENLIVE)  237 mL Oral TID BM   . feeding supplement (PRO-STAT SUGAR FREE 64)  30 mL Oral TID WC  . gabapentin  300 mg Oral TID  . Gerhardt's butt cream  1 application Topical TID  . insulin aspart  0-15 Units Subcutaneous TID WC  . insulin aspart  0-5 Units Subcutaneous QHS  . insulin glargine  10 Units Subcutaneous Daily  . levothyroxine  88 mcg Oral QAC breakfast  . midodrine  10 mg Oral TID WC  . multivitamin with minerals  1 tablet Oral Daily  . rosuvastatin  5 mg Oral Daily  . sodium chloride flush  10-40 mL Intracatheter Q12H  . vancomycin  500 mg Oral Q6H   Continuous Infusions: . metronidazole     PRN Meds:.acetaminophen, magic mouthwash w/lidocaine, [DISCONTINUED] ondansetron **OR** ondansetron (ZOFRAN) IV   Time Spent in minutes  25  See all Orders from today for further details   Oren Binet M.D on 07/15/2019 at 12:27 PM  To page go to [email or web address] - use universal password  [facility] -  Office  [phone]    Objective:   Vitals:   07/15/19 0644 07/15/19 0735 07/15/19 0933 07/15/19 1130  BP: (!) 107/57 (!) 94/51  (!) 110/58  Pulse:  80  91  Resp:  16  16  Temp: 98.1 F (36.7 C) 97.7 F (36.5 C)  97.7 F (36.5 C)  TempSrc: Oral Oral  Oral  SpO2:  96% 100% 98%  Weight:      Height:        Wt Readings from Last 3 Encounters:  07/15/19 113 kg  06/05/19 117.8 kg  12/14/18 112.5 kg     Intake/Output Summary (Last 24 hours) at 07/15/2019 1227 Last data filed at 07/15/2019 1000 Gross per 24 hour  Intake 767 ml  Output 955 ml  Net -188 ml     Physical Exam Gen Exam:Alert awake-not in any distress HEENT:atraumatic, normocephalic Chest: B/L clear to auscultation anteriorly CVS:S1S2 regular Abdomen:soft non tender, non distended Extremities:+edema Neurology: Able to lift legs off the bed-has generalized weakness all over.  But really no focal deficits evident. Skin: no rash   Data Review:    CBC Recent Labs  Lab 07/10/19 0414 07/11/19 0420 07/12/19 0500  07/12/19 1002 07/12/19 1312 07/13/19 0400 07/14/19 0616 07/15/19 0655  WBC 13.0* 9.9 11.4*  --   --  10.5 8.8 8.6  HGB 9.0* 8.8* 7.1* 9.0* 8.9* 8.9* 8.4* 8.2*  HCT 30.2* 29.1* 23.4* 29.0* 29.4* 29.4* 27.8* 27.2*  PLT 404* 364 290  --   --  346 314 280  MCV 90.7 90.1 90.7  --   --  89.9 89.4 91.3  MCH 27.0 27.2 27.5  --   --  27.2 27.0 27.5  MCHC 29.8* 30.2 30.3  --   --  30.3 30.2 30.1  RDW 17.0* 16.9* 17.2*  --   --  17.2* 17.3* 17.4*  LYMPHSABS 3.3 2.6 3.4  --   --  3.6  --   --   MONOABS 1.2* 0.7 1.0  --   --  1.0  --   --  EOSABS 0.2 0.2 0.1  --   --  0.2  --   --   BASOSABS 0.0 0.0 0.0  --   --  0.0  --   --     Chemistries  Recent Labs  Lab 07/10/19 0414 07/11/19 0420 07/12/19 0500 07/13/19 0400 07/14/19 0616 07/15/19 0655  NA 136 132* 132* 134* 134* 138  K 3.9 3.7 4.0 3.5 3.4* 3.6  CL 105 102 101 102 104 105  CO2 23 21* 24 24 23 24   GLUCOSE 136* 149* 175* 122* 82 138*  BUN 90* 80* 73* 56* 45* 37*  CREATININE 2.09* 1.72* 1.50* 1.21* 1.18* 0.91  CALCIUM 7.3* 7.3* 6.9* 7.0* 6.9* 7.1*  MG 1.8 1.7 3.2* 1.7  --   --   AST 37 54* 61* 43*  --  35  ALT 18 22 27 26   --  20  ALKPHOS 198* 292* 358* 335*  --  349*  BILITOT 0.5 0.3 0.6 0.5  --  0.3   ------------------------------------------------------------------------------------------------------------------ No results for input(s): CHOL, HDL, LDLCALC, TRIG, CHOLHDL, LDLDIRECT in the last 72 hours.  Lab Results  Component Value Date   HGBA1C 8.9 (H) 05/24/2019   ------------------------------------------------------------------------------------------------------------------ No results for input(s): TSH, T4TOTAL, T3FREE, THYROIDAB in the last 72 hours.  Invalid input(s): FREET3 ------------------------------------------------------------------------------------------------------------------ Recent Labs    07/15/19 0655  FERRITIN 141    Coagulation profile No results for input(s): INR, PROTIME in the last  168 hours.  No results for input(s): DDIMER in the last 72 hours.  Cardiac Enzymes No results for input(s): CKMB, TROPONINI, MYOGLOBIN in the last 168 hours.  Invalid input(s): CK ------------------------------------------------------------------------------------------------------------------    Component Value Date/Time   BNP 148.7 (H) 07/13/2019 0400    Micro Results Recent Results (from the past 240 hour(s))  MRSA PCR Screening     Status: None   Collection Time: 07/07/19 10:22 AM   Specimen: Nasopharyngeal  Result Value Ref Range Status   MRSA by PCR NEGATIVE NEGATIVE Final    Comment:        The GeneXpert MRSA Assay (FDA approved for NASAL specimens only), is one component of a comprehensive MRSA colonization surveillance program. It is not intended to diagnose MRSA infection nor to guide or monitor treatment for MRSA infections. Performed at [facility], [place] [address] [place], [place] [address]     Radiology Reports Ct Head Wo Contrast  Result Date: 07/04/2019 CLINICAL DATA:  Mental status. Fall 2 days ago. Positive COVID-25 May 2019. EXAM: CT HEAD WITHOUT CONTRAST TECHNIQUE: Contiguous axial images were obtained from the base of the skull through the vertex without intravenous contrast. COMPARISON:  None. FINDINGS: Brain: Ventricles, cisterns and other CSF spaces are normal. There is mild chronic ischemic microvascular disease present. There is no mass, mass effect, shift of midline structures or acute hemorrhage. No evidence of acute infarction. Vascular: No hyperdense vessel or unexpected calcification. Skull: Normal. Negative for fracture or focal lesion. Sinuses/Orbits: No acute finding. Other: None. IMPRESSION: No acute findings. Mild chronic ischemic microvascular disease. Electronically Signed   By: Marin Olp M.D.   On: 07/04/2019 07:11   Dg Chest Port 1 View  Result Date: 07/11/2019 CLINICAL DATA:  Evaluate PICC tip. EXAM:  PORTABLE CHEST 1 VIEW COMPARISON:  Chest x-ray from same day. FINDINGS: Interval retraction of the right upper extremity PICC line with the tip now in the distal SVC. Stable cardiomediastinal silhouette. Normal pulmonary vascularity. Continued low lung volumes with mild bibasilar atelectasis, slightly improved.  No focal consolidation, pleural effusion, or pneumothorax. No acute osseous abnormality. IMPRESSION: 1. Right upper extremity PICC line tip now in the distal SVC. 2. Continued low lung volumes with mildly improved bibasilar atelectasis. Electronically Signed   By: Titus Dubin M.D.   On: 07/11/2019 18:41   Dg Chest Port 1 View  Result Date: 07/11/2019 CLINICAL DATA:  PICC placement EXAM: PORTABLE CHEST 1 VIEW COMPARISON:  07/04/2019 FINDINGS: Right arm PICC is been placed. Tip in the mid right atrium. Recommend withdrawal of 3 cm for optimum position Hypoventilation with bibasilar atelectasis.  No effusion or edema. IMPRESSION: PICC tip in the mid right atrium.  Recommend withdrawal 3 cm Hypoventilation with bibasilar atelectasis. Electronically Signed   By: Franchot Gallo M.D.   On: 07/11/2019 17:30   Dg Chest Port 1 View  Result Date: 07/04/2019 CLINICAL DATA:  Altered mental status and hypotension EXAM: PORTABLE CHEST 1 VIEW COMPARISON:  06/03/2019 FINDINGS: Mild cardiomegaly accentuated by low volumes. Otherwise normal mediastinal contours. There is no edema, consolidation, effusion, or pneumothorax. Postoperative proximal left humerus. IMPRESSION: No evidence of acute disease. Electronically Signed   By: Monte Fantasia M.D.   On: 07/04/2019 05:26   Dg Abd Portable 1v  Result Date: 07/11/2019 CLINICAL DATA:  Diarrhea and C diff EXAM: PORTABLE ABDOMEN - 1 VIEW COMPARISON:  None. FINDINGS: Gas-filled loops of colon within normal limits. No dilated loops of Baum bowel. Gas the rectum. IMPRESSION: Gas-filled colon. No evidence obstruction or inflammation by plain film imaging Electronically  Signed   By: Suzy Bouchard M.D.   On: 07/11/2019 11:32   [name] Ekg Site Rite  Result Date: 07/11/2019 If Site Rite image not attached, placement could not be confirmed due to current cardiac rhythm.

## 2019-07-16 DIAGNOSIS — U071 COVID-19: Secondary | ICD-10-CM | POA: Diagnosis not present

## 2019-07-16 DIAGNOSIS — I5032 Chronic diastolic (congestive) heart failure: Secondary | ICD-10-CM | POA: Diagnosis not present

## 2019-07-16 DIAGNOSIS — N179 Acute kidney failure, unspecified: Secondary | ICD-10-CM | POA: Diagnosis not present

## 2019-07-16 DIAGNOSIS — E039 Hypothyroidism, unspecified: Secondary | ICD-10-CM | POA: Diagnosis not present

## 2019-07-16 LAB — COMPREHENSIVE METABOLIC PANEL
ALT: 17 U/L (ref 0–44)
AST: 26 U/L (ref 15–41)
Albumin: 1.2 g/dL — ABNORMAL LOW (ref 3.5–5.0)
Alkaline Phosphatase: 274 U/L — ABNORMAL HIGH (ref 38–126)
Anion gap: 5 (ref 5–15)
BUN: 27 mg/dL — ABNORMAL HIGH (ref 8–23)
CO2: 22 mmol/L (ref 22–32)
Calcium: 6.1 mg/dL — CL (ref 8.9–10.3)
Chloride: 112 mmol/L — ABNORMAL HIGH (ref 98–111)
Creatinine, Ser: 0.86 mg/dL (ref 0.44–1.00)
GFR calc Af Amer: >60 mL/min (ref >60)
GFR calc non Af Amer: >60 mL/min (ref >60)
Glucose, Bld: 99 mg/dL (ref 70–99)
Potassium: 3.2 mmol/L — ABNORMAL LOW (ref 3.5–5.1)
Sodium: 139 mmol/L (ref 135–145)
Total Bilirubin: 0.4 mg/dL (ref 0.3–1.2)
Total Protein: 4.2 g/dL — ABNORMAL LOW (ref 6.5–8.1)

## 2019-07-16 LAB — CBC
HCT: 29.8 % — ABNORMAL LOW (ref 36.0–46.0)
Hemoglobin: 8.9 g/dL — ABNORMAL LOW (ref 12.0–15.0)
MCH: 27.5 pg (ref 26.0–34.0)
MCHC: 29.9 g/dL — ABNORMAL LOW (ref 30.0–36.0)
MCV: 92 fL (ref 80.0–100.0)
Platelets: 312 10*3/uL (ref 150–400)
RBC: 3.24 MIL/uL — ABNORMAL LOW (ref 3.87–5.11)
RDW: 17.7 % — ABNORMAL HIGH (ref 11.5–15.5)
WBC: 10.4 10*3/uL (ref 4.0–10.5)
nRBC: 0.2 % (ref 0.0–0.2)

## 2019-07-16 LAB — MAGNESIUM: Magnesium: 1.3 mg/dL — ABNORMAL LOW (ref 1.7–2.4)

## 2019-07-16 LAB — GLUCOSE, CAPILLARY
Glucose-Capillary: 109 mg/dL — ABNORMAL HIGH (ref 70–99)
Glucose-Capillary: 173 mg/dL — ABNORMAL HIGH (ref 70–99)
Glucose-Capillary: 187 mg/dL — ABNORMAL HIGH (ref 70–99)
Glucose-Capillary: 145 mg/dL — ABNORMAL HIGH (ref 70–99)

## 2019-07-16 MED ORDER — MIDODRINE HCL 5 MG PO TABS
10.0000 mg | ORAL_TABLET | Freq: Two times a day (BID) | ORAL | Status: DC
Start: 2019-07-16 — End: 2019-08-05
  Administered 2019-07-16 – 2019-08-02 (×30): 10 mg via ORAL
  Filled 2019-07-16 (×34): qty 2

## 2019-07-16 MED ORDER — FUROSEMIDE 10 MG/ML IJ SOLN
20.0000 mg | Freq: Once | INTRAMUSCULAR | Status: AC
  Administered 2019-07-16: 15:00:00 20 mg via INTRAVENOUS
  Filled 2019-07-16: qty 2

## 2019-07-16 MED ORDER — MAGNESIUM SULFATE 4 GM/100ML IV SOLN
4.0000 g | Freq: Once | INTRAVENOUS | Status: AC
  Administered 2019-07-16: 10:00:00 4 g via INTRAVENOUS
  Filled 2019-07-16: qty 100

## 2019-07-16 MED ORDER — PROMETHAZINE HCL 25 MG/ML IJ SOLN
12.5000 mg | Freq: Four times a day (QID) | INTRAMUSCULAR | Status: DC | PRN
  Administered 2019-07-16 – 2019-08-05 (×10): 12.5 mg via INTRAVENOUS
  Filled 2019-07-16 (×10): qty 1

## 2019-07-16 MED ORDER — POTASSIUM CHLORIDE CRYS ER 20 MEQ PO TBCR
40.0000 meq | EXTENDED_RELEASE_TABLET | ORAL | Status: AC
  Administered 2019-07-16 (×2): 40 meq via ORAL
  Filled 2019-07-16 (×2): qty 2

## 2019-07-16 NOTE — Progress Notes (Signed)
PROGRESS NOTE                                                                                                                                                                                                             Patient Demographics:    Alice Kemp, is a 68 y.o. female, DOB - Jun 03, 1951, [id number]  Outpatient Primary MD for the patient is [name], MD   Admit date - 07/04/2019   LOS - 12  Chief Complaint  Patient presents with  . Fall       Brief Narrative: Patient is a 68 y.o. female with PMHx of hypothyroidism, morbid obesity, dyslipidemia, DM, chronic diastolic heart failure, CKD stage III who was hospitalized in August for spinal stenosis-she underwent a lumbar laminectomy-she was incidentally found to have COVID-19 during that hospitalization.  She presented to the ED for evaluation of fall along with profuse diarrhea-she was subsequently found to have C. difficile colitis along with severe dehydration and AKI.  She again was found to have COVID-19.  She was subsequently admitted to the hospitalist service.   Subjective:    Kath Lamond feels much better-diarrhea has slowed down-she only had 1-2 loose stools yesterday afternoon-and another 1-2 stools overnight.  RN confirms-significant slowdown and diarrhea-and that stools are getting more formed.   Assessment  & Plan :   COVID-19 infection: Incidental finding-without any respiratory symptoms-GI symptoms are from C. difficile colitis.  Severe C. difficile colitis: Improved-diarrhea has slowed down significantly-and stools are getting formed as well.  Continue IV Flagyl and p.o. vancomycin.  Was given 2 doses of Questran-stop today.  AKI: Hemodynamically mediated-in the setting of diarrhea-resolved.    Hypotension: Secondary to volume loss due to diarrhea-BP improving-change midodrine to twice daily dosing-suspect we can gradually taper off over the  next few days.  Hypokalemia/hypomagnesemia/hypocalcemia: Replete potassium and magnesium-corrected calcium is in the low normal range.  Depression: Stable-continue amitriptyline, Wellbutrin and Cymbalta  Hypothyroidism: Continue Synthroid   Chronic diastolic heart failure: Continues to have lower extremity edema-probably from hypoalbuminemia rather than decompensated heart failure-repeat 1 dose of Lasix today.  Hypertension: Given hypotension-continue to hold all antihypertensives.  DM-2: CBGs stable-continue Lantus 10 units and SSI.  CBG (last 3)  Recent Labs    07/15/19 2109 07/16/19 0800 07/16/19 1137  GLUCAP 164* 109* 173*    Dyslipidemia:  Continue with Synthroid  Stage II sacral decubitus ulcer: Continue per wound care.   Recent L-spine spinal stenosis surgery last month on 05/30/2019 by Dr. Saintclair Halsted.    Per patient-while at [facility] after her discharge from the hospital in August-she was slowly improving and was just about able to get up with assistance and ambulate a few steps with assistance.  She is able to lift both of her legs off the bed-and just about able to bend them at the knee   Obesity: Estimated body mass index is 42.74 kg/m as calculated from the following:   Height as of this encounter: 5' 4.02" (1.626 m).   Weight as of this encounter: 113 kg.   Debility/deconditioning: Sounds like she has chronic debility at baseline from recent surgery/spondylolisthesis-but it seems like she has worsened after some improvement in function after her recent stay at SNF.  Suspect worsening is from acute illness associated debility/deconditioning due to severe C. difficile colitis.  ABG:    Component Value Date/Time   PHART 7.420 (H) 01/28/2011 1502   PCO2ART 39.8 01/28/2011 1502   PO2ART 68.0 (L) 01/28/2011 1502   HCO3 25.8 (H) 01/28/2011 1502   TCO2 27 01/28/2011 1502   ACIDBASEDEF 2.0 01/28/2011 1453   O2SAT 94.0 01/28/2011 1502    Vent Settings: N/A   Condition -  Stable  Family Communication  : Called patient's mother 10/9-unable to leave voicemail as mailbox full.  Code Status :  DNR  Diet :  Diet Order            DIET DYS 3 Room service appropriate? Yes; Fluid consistency: Thin  Diet effective now               Disposition Plan  :  Remain hospitalized-SNF probably early next week  Barriers to discharge: Diarrhea-electrolyte abnormalities  Consults  :  None  Procedures  :  None  Antibiotics  :    Anti-infectives (From admission, onward)   Start     Dose/Rate Route Frequency Ordered Stop   07/15/19 1400  metroNIDAZOLE (FLAGYL) IVPB 500 mg     500 mg 100 mL/hr over 60 Minutes Intravenous Every 8 hours 07/15/19 0953     07/13/19 1000  metroNIDAZOLE (FLAGYL) tablet 500 mg  Status:  Discontinued     500 mg Oral Every 8 hours 07/13/19 0952 07/15/19 0953   07/12/19 0400  metroNIDAZOLE (FLAGYL) IVPB 500 mg  Status:  Discontinued     500 mg 100 mL/hr over 60 Minutes Intravenous Every 8 hours 07/11/19 1935 07/13/19 0952   07/11/19 1200  metroNIDAZOLE (FLAGYL) IVPB 500 mg  Status:  Discontinued     500 mg 100 mL/hr over 60 Minutes Intravenous Every 8 hours 07/11/19 1055 07/11/19 1935   07/09/19 2130  vancomycin (VANCOCIN) 50 mg/mL oral solution 500 mg  Status:  Discontinued     500 mg Oral Every 6 hours 07/08/19 2104 07/08/19 2146   07/08/19 2200  vancomycin (VANCOCIN) 50 mg/mL oral solution 500 mg     500 mg Oral Every 6 hours 07/08/19 2146     07/05/19 1600  remdesivir 100 mg in sodium chloride 0.9 % 250 mL IVPB  Status:  Discontinued     100 mg 500 mL/hr over 30 Minutes Intravenous Every 24 hours 07/04/19 1517 07/05/19 0734   07/04/19 1600  remdesivir 200 mg in sodium chloride 0.9 % 250 mL IVPB     200 mg 500 mL/hr over 30 Minutes Intravenous Once 07/04/19 1517 07/04/19  1941   07/04/19 1400  vancomycin (VANCOCIN) 50 mg/mL oral solution 500 mg  Status:  Discontinued     500 mg Oral Every 6 hours 07/04/19 1134 07/08/19 2104       Inpatient Medications  Scheduled Meds: . aspirin  81 mg Oral Daily  . buPROPion  100 mg Oral BID  . Chlorhexidine Gluconate Cloth  6 each Topical Daily  . DULoxetine  30 mg Oral BID  . enoxaparin (LOVENOX) injection  40 mg Subcutaneous Q24H  . feeding supplement (ENSURE ENLIVE)  237 mL Oral TID BM  . feeding supplement (PRO-STAT SUGAR FREE 64)  30 mL Oral TID WC  . gabapentin  300 mg Oral TID  . Gerhardt's butt cream  1 application Topical TID  . insulin aspart  0-15 Units Subcutaneous TID WC  . insulin aspart  0-5 Units Subcutaneous QHS  . insulin glargine  10 Units Subcutaneous Daily  . levothyroxine  88 mcg Oral QAC breakfast  . midodrine  10 mg Oral TID WC  . multivitamin with minerals  1 tablet Oral Daily  . rosuvastatin  5 mg Oral Daily  . sodium chloride flush  10-40 mL Intracatheter Q12H  . vancomycin  500 mg Oral Q6H   Continuous Infusions: . metronidazole 500 mg (07/16/19 1353)   PRN Meds:.acetaminophen, magic mouthwash w/lidocaine, [DISCONTINUED] ondansetron **OR** ondansetron (ZOFRAN) IV   Time Spent in minutes  25  See all Orders from today for further details   Oren Binet M.D on 07/16/2019 at 3:05 PM  To page go to [email or web address] - use universal password  [facility] -  Office  [phone]    Objective:   Vitals:   07/15/19 2005 07/16/19 0500 07/16/19 0802 07/16/19 1118  BP: 120/68  (!) 95/56 (!) 110/59  Pulse: 75  91 85  Resp:   16 18  Temp: 98 F (36.7 C)  98.6 F (37 C) 99 F (37.2 C)  TempSrc: Oral  Oral Axillary  SpO2: 100%  95% 98%  Weight:  113 kg    Height:        Wt Readings from Last 3 Encounters:  07/16/19 113 kg  06/05/19 117.8 kg  12/14/18 112.5 kg     Intake/Output Summary (Last 24 hours) at 07/16/2019 1505 Last data filed at 07/16/2019 1353 Gross per 24 hour  Intake 810 ml  Output 300 ml  Net 510 ml     Physical Exam Gen Exam:Alert awake-not in any distress HEENT:atraumatic, normocephalic Chest: B/L  clear to auscultation anteriorly CVS:S1S2 regular Abdomen:soft non tender, non distended Extremities:++edema Neurology: Non focal-but has diffuse weakness all over.  Able to lift both lower extremities off the bed-and just about able to draw them up and bend at the knee Skin: no rash   Data Review:    CBC Recent Labs  Lab 07/10/19 0414 07/11/19 0420 07/12/19 0500  07/12/19 1312 07/13/19 0400 07/14/19 0616 07/15/19 0655 07/16/19 0819  WBC 13.0* 9.9 11.4*  --   --  10.5 8.8 8.6 10.4  HGB 9.0* 8.8* 7.1*   < > 8.9* 8.9* 8.4* 8.2* 8.9*  HCT 30.2* 29.1* 23.4*   < > 29.4* 29.4* 27.8* 27.2* 29.8*  PLT 404* 364 290  --   --  346 314 280 312  MCV 90.7 90.1 90.7  --   --  89.9 89.4 91.3 92.0  MCH 27.0 27.2 27.5  --   --  27.2 27.0 27.5 27.5  MCHC 29.8* 30.2 30.3  --   --  30.3 30.2 30.1 29.9*  RDW 17.0* 16.9* 17.2*  --   --  17.2* 17.3* 17.4* 17.7*  LYMPHSABS 3.3 2.6 3.4  --   --  3.6  --   --   --   MONOABS 1.2* 0.7 1.0  --   --  1.0  --   --   --   EOSABS 0.2 0.2 0.1  --   --  0.2  --   --   --   BASOSABS 0.0 0.0 0.0  --   --  0.0  --   --   --    < > = values in this interval not displayed.    Chemistries  Recent Labs  Lab 07/10/19 0414 07/11/19 0420 07/12/19 0500 07/13/19 0400 07/14/19 0616 07/15/19 0655 07/16/19 0819  NA 136 132* 132* 134* 134* 138 139  K 3.9 3.7 4.0 3.5 3.4* 3.6 3.2*  CL 105 102 101 102 104 105 112*  CO2 23 21* 24 24 23 24 22   GLUCOSE 136* 149* 175* 122* 82 138* 99  BUN 90* 80* 73* 56* 45* 37* 27*  CREATININE 2.09* 1.72* 1.50* 1.21* 1.18* 0.91 0.86  CALCIUM 7.3* 7.3* 6.9* 7.0* 6.9* 7.1* 6.1*  MG 1.8 1.7 3.2* 1.7  --   --  1.3*  AST 37 54* 61* 43*  --  35 26  ALT 18 22 27 26   --  20 17  ALKPHOS 198* 292* 358* 335*  --  349* 274*  BILITOT 0.5 0.3 0.6 0.5  --  0.3 0.4   ------------------------------------------------------------------------------------------------------------------ No results for input(s): CHOL, HDL, LDLCALC, TRIG, CHOLHDL,  LDLDIRECT in the last 72 hours.  Lab Results  Component Value Date   HGBA1C 8.9 (H) 05/24/2019   ------------------------------------------------------------------------------------------------------------------ No results for input(s): TSH, T4TOTAL, T3FREE, THYROIDAB in the last 72 hours.  Invalid input(s): FREET3 ------------------------------------------------------------------------------------------------------------------ Recent Labs    07/15/19 0655  FERRITIN 141    Coagulation profile No results for input(s): INR, PROTIME in the last 168 hours.  No results for input(s): DDIMER in the last 72 hours.  Cardiac Enzymes No results for input(s): CKMB, TROPONINI, MYOGLOBIN in the last 168 hours.  Invalid input(s): CK ------------------------------------------------------------------------------------------------------------------    Component Value Date/Time   BNP 148.7 (H) 07/13/2019 0400    Micro Results Recent Results (from the past 240 hour(s))  MRSA PCR Screening     Status: None   Collection Time: 07/07/19 10:22 AM   Specimen: Nasopharyngeal  Result Value Ref Range Status   MRSA by PCR NEGATIVE NEGATIVE Final    Comment:        The GeneXpert MRSA Assay (FDA approved for NASAL specimens only), is one component of a comprehensive MRSA colonization surveillance program. It is not intended to diagnose MRSA infection nor to guide or monitor treatment for MRSA infections. Performed at [facility], [place] [address], [place], [place] [address]     Radiology Reports Ct Head Wo Contrast  Result Date: 07/04/2019 CLINICAL DATA:  Mental status. Fall 2 days ago. Positive COVID-25 May 2019. EXAM: CT HEAD WITHOUT CONTRAST TECHNIQUE: Contiguous axial images were obtained from the base of the skull through the vertex without intravenous contrast. COMPARISON:  None. FINDINGS: Brain: Ventricles, cisterns and other CSF spaces are normal. There is mild  chronic ischemic microvascular disease present. There is no mass, mass effect, shift of midline structures or acute hemorrhage. No evidence of acute infarction. Vascular: No hyperdense vessel or unexpected calcification. Skull: Normal. Negative for fracture or  focal lesion. Sinuses/Orbits: No acute finding. Other: None. IMPRESSION: No acute findings. Mild chronic ischemic microvascular disease. Electronically Signed   By: Marin Olp M.D.   On: 07/04/2019 07:11   Dg Chest Port 1 View  Result Date: 07/11/2019 CLINICAL DATA:  Evaluate PICC tip. EXAM: PORTABLE CHEST 1 VIEW COMPARISON:  Chest x-ray from same day. FINDINGS: Interval retraction of the right upper extremity PICC line with the tip now in the distal SVC. Stable cardiomediastinal silhouette. Normal pulmonary vascularity. Continued low lung volumes with mild bibasilar atelectasis, slightly improved. No focal consolidation, pleural effusion, or pneumothorax. No acute osseous abnormality. IMPRESSION: 1. Right upper extremity PICC line tip now in the distal SVC. 2. Continued low lung volumes with mildly improved bibasilar atelectasis. Electronically Signed   By: Titus Dubin M.D.   On: 07/11/2019 18:41   Dg Chest Port 1 View  Result Date: 07/11/2019 CLINICAL DATA:  PICC placement EXAM: PORTABLE CHEST 1 VIEW COMPARISON:  07/04/2019 FINDINGS: Right arm PICC is been placed. Tip in the mid right atrium. Recommend withdrawal of 3 cm for optimum position Hypoventilation with bibasilar atelectasis.  No effusion or edema. IMPRESSION: PICC tip in the mid right atrium.  Recommend withdrawal 3 cm Hypoventilation with bibasilar atelectasis. Electronically Signed   By: Franchot Gallo M.D.   On: 07/11/2019 17:30   Dg Chest Port 1 View  Result Date: 07/04/2019 CLINICAL DATA:  Altered mental status and hypotension EXAM: PORTABLE CHEST 1 VIEW COMPARISON:  06/03/2019 FINDINGS: Mild cardiomegaly accentuated by low volumes. Otherwise normal mediastinal contours.  There is no edema, consolidation, effusion, or pneumothorax. Postoperative proximal left humerus. IMPRESSION: No evidence of acute disease. Electronically Signed   By: Monte Fantasia M.D.   On: 07/04/2019 05:26   Dg Abd Portable 1v  Result Date: 07/11/2019 CLINICAL DATA:  Diarrhea and C diff EXAM: PORTABLE ABDOMEN - 1 VIEW COMPARISON:  None. FINDINGS: Gas-filled loops of colon within normal limits. No dilated loops of Schue bowel. Gas the rectum. IMPRESSION: Gas-filled colon. No evidence obstruction or inflammation by plain film imaging Electronically Signed   By: Suzy Bouchard M.D.   On: 07/11/2019 11:32   [name] Ekg Site Rite  Result Date: 07/11/2019 If Site Rite image not attached, placement could not be confirmed due to current cardiac rhythm.

## 2019-07-16 NOTE — Progress Notes (Signed)
Pts mother called and updated all questions answered

## 2019-07-17 DIAGNOSIS — U071 COVID-19: Secondary | ICD-10-CM | POA: Diagnosis not present

## 2019-07-17 DIAGNOSIS — G9349 Other encephalopathy: Secondary | ICD-10-CM | POA: Diagnosis not present

## 2019-07-17 LAB — COMPREHENSIVE METABOLIC PANEL
ALT: 17 U/L (ref 0–44)
AST: 29 U/L (ref 15–41)
Albumin: 1.3 g/dL — ABNORMAL LOW (ref 3.5–5.0)
Alkaline Phosphatase: 282 U/L — ABNORMAL HIGH (ref 38–126)
Anion gap: 4 — ABNORMAL LOW (ref 5–15)
BUN: 27 mg/dL — ABNORMAL HIGH (ref 8–23)
CO2: 25 mmol/L (ref 22–32)
Calcium: 7.2 mg/dL — ABNORMAL LOW (ref 8.9–10.3)
Chloride: 109 mmol/L (ref 98–111)
Creatinine, Ser: 0.99 mg/dL (ref 0.44–1.00)
GFR calc Af Amer: >60 mL/min (ref >60)
GFR calc non Af Amer: 58 mL/min — ABNORMAL LOW (ref >60)
Glucose, Bld: 115 mg/dL — ABNORMAL HIGH (ref 70–99)
Potassium: 4.3 mmol/L (ref 3.5–5.1)
Sodium: 138 mmol/L (ref 135–145)
Total Bilirubin: 0.5 mg/dL (ref 0.3–1.2)
Total Protein: 4.7 g/dL — ABNORMAL LOW (ref 6.5–8.1)

## 2019-07-17 LAB — CBC
HCT: 28.4 % — ABNORMAL LOW (ref 36.0–46.0)
Hemoglobin: 8.5 g/dL — ABNORMAL LOW (ref 12.0–15.0)
MCH: 27.2 pg (ref 26.0–34.0)
MCHC: 29.9 g/dL — ABNORMAL LOW (ref 30.0–36.0)
MCV: 91 fL (ref 80.0–100.0)
Platelets: 319 10*3/uL (ref 150–400)
RBC: 3.12 MIL/uL — ABNORMAL LOW (ref 3.87–5.11)
RDW: 18.1 % — ABNORMAL HIGH (ref 11.5–15.5)
WBC: 8.2 10*3/uL (ref 4.0–10.5)
nRBC: 0 % (ref 0.0–0.2)

## 2019-07-17 LAB — MAGNESIUM: Magnesium: 2.1 mg/dL (ref 1.7–2.4)

## 2019-07-17 LAB — GLUCOSE, CAPILLARY
Glucose-Capillary: 125 mg/dL — ABNORMAL HIGH (ref 70–99)
Glucose-Capillary: 174 mg/dL — ABNORMAL HIGH (ref 70–99)
Glucose-Capillary: 150 mg/dL — ABNORMAL HIGH (ref 70–99)
Glucose-Capillary: 136 mg/dL — ABNORMAL HIGH (ref 70–99)

## 2019-07-17 MED ORDER — SODIUM CHLORIDE 0.9 % IV BOLUS
500.0000 mL | Freq: Once | INTRAVENOUS | Status: AC
  Administered 2019-07-17: 09:00:00 500 mL via INTRAVENOUS

## 2019-07-17 MED ORDER — ENOXAPARIN SODIUM 60 MG/0.6ML ~~LOC~~ SOLN
55.0000 mg | SUBCUTANEOUS | Status: DC
  Administered 2019-07-17 – 2019-07-22 (×6): 55 mg via SUBCUTANEOUS
  Filled 2019-07-17 (×6): qty 0.6

## 2019-07-17 MED ORDER — LACTATED RINGERS IV SOLN
INTRAVENOUS | Status: AC
  Administered 2019-07-17 (×2): via INTRAVENOUS

## 2019-07-17 NOTE — Progress Notes (Signed)
PROGRESS NOTE                                                                                                                                                                                                             Patient Demographics:    Alice Kemp, is a 68 y.o. female, DOB - 02/18/1951, [id number]  Outpatient Primary MD for the patient is [name], MD   Admit date - 07/04/2019   LOS - 65  Chief Complaint  Patient presents with  . Fall       Brief Narrative: Patient is a 68 y.o. female with PMHx of hypothyroidism, morbid obesity, dyslipidemia, DM, chronic diastolic heart failure, CKD stage III who was hospitalized in August for spinal stenosis-she underwent a lumbar laminectomy-she was incidentally found to have COVID-19 during that hospitalization.  She presented to the ED for evaluation of fall along with profuse diarrhea-she was subsequently found to have C. difficile colitis along with severe dehydration and AKI.  She again was found to have COVID-19.  She was subsequently admitted to the hospitalist service.   Subjective:    Patient in bed, appears comfortable, denies any headache, no fever, no chest pain or pressure, no shortness of breath , no abdominal pain. No focal weakness.  Diarrhea is improving.   Assessment  & Plan :   COVID-19 infection: Incidental finding-without any respiratory symptoms-GI symptoms are from C. difficile colitis.  Severe C. difficile colitis: Improved-diarrhea has slowed down significantly-and stools are getting formed as well.  Continue IV Flagyl and p.o. Vancomycin.  PRN Lucrezia Starch has been used in the past.  AKI: Hemodynamically mediated-in the setting of diarrhea-resolved.    Hypotension: Due to volume loss from diarrhea, IV fluid bolus and maintenance ordered again on 07/17/2019 due to persistent hypotension, continue midodrine and monitor.   Hypokalemia/hypomagnesemia/hypocalcemia: Replete potassium and magnesium-corrected calcium is in the low normal range.  Depression: Stable-continue amitriptyline, Wellbutrin and Cymbalta  Hypothyroidism: Continue Synthroid   Chronic diastolic heart failure: Continues to have lower extremity edema-probably from hypoalbuminemia rather than decompensated heart failure-repeat 1 dose of Lasix today.  Hypertension: Given hypotension-continue to hold all antihypertensives.  Dyslipidemia: Continue with Synthroid  Stage II sacral decubitus ulcer: Continue per wound care.   Recent L-spine spinal stenosis surgery last month on 05/30/2019 by Dr. Saintclair Halsted.    Per patient-while  at SNF after her discharge from the hospital in August-she was slowly improving and was just about able to get up with assistance and ambulate a few steps with assistance.  She is able to lift both of her legs off the bed-and just about able to bend them at the knee   Obesity: BMI of 42 follow with PCP for weight loss.  Debility/deconditioning: Sounds like she has chronic debility at baseline from recent surgery/spondylolisthesis-but it seems like she has worsened after some improvement in function after her recent stay at SNF.  Suspect worsening is from acute illness associated debility/deconditioning due to severe C. difficile colitis.  DM-2: CBGs stable-continue Lantus 10 units and SSI.  Poor outpatient control due to hyperglycemia.  Lab Results  Component Value Date   HGBA1C 8.9 (H) 05/24/2019   CBG (last 3)  Recent Labs    07/16/19 1647 07/16/19 2200 07/17/19 0812  GLUCAP 187* 145* 125*     Condition - Stable  Family Communication  : Brother and mother were updated by me personally few days into admission.  Code Status :  DNR  Diet :  Diet Order            DIET DYS 3 Room service appropriate? Yes; Fluid consistency: Thin  Diet effective now               Disposition Plan  :  Remain hospitalized-SNF  probably early next week  Barriers to discharge: Diarrhea-electrolyte abnormalities  Consults  : Over the phone GI Dr.Gupta and ID Dr. Linus Salmons were consulted.  Procedures  :  None  Antibiotics  :    Anti-infectives (From admission, onward)   Start     Dose/Rate Route Frequency Ordered Stop   07/15/19 1400  metroNIDAZOLE (FLAGYL) IVPB 500 mg     500 mg 100 mL/hr over 60 Minutes Intravenous Every 8 hours 07/15/19 0953     07/13/19 1000  metroNIDAZOLE (FLAGYL) tablet 500 mg  Status:  Discontinued     500 mg Oral Every 8 hours 07/13/19 0952 07/15/19 0953   07/12/19 0400  metroNIDAZOLE (FLAGYL) IVPB 500 mg  Status:  Discontinued     500 mg 100 mL/hr over 60 Minutes Intravenous Every 8 hours 07/11/19 1935 07/13/19 0952   07/11/19 1200  metroNIDAZOLE (FLAGYL) IVPB 500 mg  Status:  Discontinued     500 mg 100 mL/hr over 60 Minutes Intravenous Every 8 hours 07/11/19 1055 07/11/19 1935   07/09/19 2130  vancomycin (VANCOCIN) 50 mg/mL oral solution 500 mg  Status:  Discontinued     500 mg Oral Every 6 hours 07/08/19 2104 07/08/19 2146   07/08/19 2200  vancomycin (VANCOCIN) 50 mg/mL oral solution 500 mg     500 mg Oral Every 6 hours 07/08/19 2146     07/05/19 1600  remdesivir 100 mg in sodium chloride 0.9 % 250 mL IVPB  Status:  Discontinued     100 mg 500 mL/hr over 30 Minutes Intravenous Every 24 hours 07/04/19 1517 07/05/19 0734   07/04/19 1600  remdesivir 200 mg in sodium chloride 0.9 % 250 mL IVPB     200 mg 500 mL/hr over 30 Minutes Intravenous Once 07/04/19 1517 07/04/19 1941   07/04/19 1400  vancomycin (VANCOCIN) 50 mg/mL oral solution 500 mg  Status:  Discontinued     500 mg Oral Every 6 hours 07/04/19 1134 07/08/19 2104      Inpatient Medications  Scheduled Meds: . aspirin  81 mg Oral Daily  .  buPROPion  100 mg Oral BID  . Chlorhexidine Gluconate Cloth  6 each Topical Daily  . DULoxetine  30 mg Oral BID  . enoxaparin (LOVENOX) injection  55 mg Subcutaneous Q24H  . feeding  supplement (ENSURE ENLIVE)  237 mL Oral TID BM  . feeding supplement (PRO-STAT SUGAR FREE 64)  30 mL Oral TID WC  . gabapentin  300 mg Oral TID  . Gerhardt's butt cream  1 application Topical TID  . insulin aspart  0-15 Units Subcutaneous TID WC  . insulin aspart  0-5 Units Subcutaneous QHS  . insulin glargine  10 Units Subcutaneous Daily  . levothyroxine  88 mcg Oral QAC breakfast  . midodrine  10 mg Oral BID WC  . multivitamin with minerals  1 tablet Oral Daily  . rosuvastatin  5 mg Oral Daily  . sodium chloride flush  10-40 mL Intracatheter Q12H  . vancomycin  500 mg Oral Q6H   Continuous Infusions: . lactated ringers    . metronidazole 500 mg (07/17/19 0617)   PRN Meds:.acetaminophen, magic mouthwash w/lidocaine, [DISCONTINUED] ondansetron **OR** ondansetron (ZOFRAN) IV, promethazine   Time Spent in minutes  25  See all Orders from today for further details   Lala Lund M.D on 07/17/2019 at 10:02 AM  To page go to [email or web address] - use universal password  [facility] -  Office  [phone]    Objective:   Vitals:   07/17/19 0400 07/17/19 0500 07/17/19 0700 07/17/19 0815  BP: 111/60   (!) 92/45  Pulse: 89  85 89  Resp: 16     Temp: 99.9 F (37.7 C)   98.3 F (36.8 C)  TempSrc: Axillary   Oral  SpO2: 94%  94% 94%  Weight:  113 kg    Height:        Wt Readings from Last 3 Encounters:  07/17/19 113 kg  06/05/19 117.8 kg  12/14/18 112.5 kg     Intake/Output Summary (Last 24 hours) at 07/17/2019 1002 Last data filed at 07/17/2019 0609 Gross per 24 hour  Intake 687.62 ml  Output 900 ml  Net -212.38 ml     Physical Exam  Awake Alert, Oriented X 3, No new F.N deficits, lower extremity strength 4/5 bilateral - chronic,  Normal affect [place].AT,PERRAL Supple Neck,No JVD, No cervical lymphadenopathy appriciated.  Symmetrical Chest wall movement, Good air movement bilaterally, CTAB RRR,No Gallops, Rubs or new Murmurs, No Parasternal Heave +ve  B.Sounds, Abd Soft, No tenderness, No organomegaly appriciated, No rebound - guarding or rigidity. No Cyanosis, Clubbing or edema, No new Rash or bruise    Data Review:    CBC Recent Labs  Lab 07/11/19 0420 07/12/19 0500  07/13/19 0400 07/14/19 0616 07/15/19 0655 07/16/19 0819 07/17/19 0645  WBC 9.9 11.4*  --  10.5 8.8 8.6 10.4 8.2  HGB 8.8* 7.1*   < > 8.9* 8.4* 8.2* 8.9* 8.5*  HCT 29.1* 23.4*   < > 29.4* 27.8* 27.2* 29.8* 28.4*  PLT 364 290  --  346 314 280 312 319  MCV 90.1 90.7  --  89.9 89.4 91.3 92.0 91.0  MCH 27.2 27.5  --  27.2 27.0 27.5 27.5 27.2  MCHC 30.2 30.3  --  30.3 30.2 30.1 29.9* 29.9*  RDW 16.9* 17.2*  --  17.2* 17.3* 17.4* 17.7* 18.1*  LYMPHSABS 2.6 3.4  --  3.6  --   --   --   --   MONOABS 0.7 1.0  --  1.0  --   --   --   --  EOSABS 0.2 0.1  --  0.2  --   --   --   --   BASOSABS 0.0 0.0  --  0.0  --   --   --   --    < > = values in this interval not displayed.    Chemistries  Recent Labs  Lab 07/11/19 0420 07/12/19 0500 07/13/19 0400 07/14/19 0616 07/15/19 0655 07/16/19 0819  NA 132* 132* 134* 134* 138 139  K 3.7 4.0 3.5 3.4* 3.6 3.2*  CL 102 101 102 104 105 112*  CO2 21* 24 24 23 24 22   GLUCOSE 149* 175* 122* 82 138* 99  BUN 80* 73* 56* 45* 37* 27*  CREATININE 1.72* 1.50* 1.21* 1.18* 0.91 0.86  CALCIUM 7.3* 6.9* 7.0* 6.9* 7.1* 6.1*  MG 1.7 3.2* 1.7  --   --  1.3*  AST 54* 61* 43*  --  35 26  ALT 22 27 26   --  20 17  ALKPHOS 292* 358* 335*  --  349* 274*  BILITOT 0.3 0.6 0.5  --  0.3 0.4   ------------------------------------------------------------------------------------------------------------------ No results for input(s): CHOL, HDL, LDLCALC, TRIG, CHOLHDL, LDLDIRECT in the last 72 hours.  Lab Results  Component Value Date   HGBA1C 8.9 (H) 05/24/2019   ------------------------------------------------------------------------------------------------------------------ No results for input(s): TSH, T4TOTAL, T3FREE, THYROIDAB in the  last 72 hours.  Invalid input(s): FREET3 ------------------------------------------------------------------------------------------------------------------ Recent Labs    07/15/19 0655  FERRITIN 141    Coagulation profile No results for input(s): INR, PROTIME in the last 168 hours.  No results for input(s): DDIMER in the last 72 hours.  Cardiac Enzymes No results for input(s): CKMB, TROPONINI, MYOGLOBIN in the last 168 hours.  Invalid input(s): CK ------------------------------------------------------------------------------------------------------------------    Component Value Date/Time   BNP 148.7 (H) 07/13/2019 0400    Micro Results Recent Results (from the past 240 hour(s))  MRSA PCR Screening     Status: None   Collection Time: 07/07/19 10:22 AM   Specimen: Nasopharyngeal  Result Value Ref Range Status   MRSA by PCR NEGATIVE NEGATIVE Final    Comment:        The GeneXpert MRSA Assay (FDA approved for NASAL specimens only), is one component of a comprehensive MRSA colonization surveillance program. It is not intended to diagnose MRSA infection nor to guide or monitor treatment for MRSA infections. Performed at [facility], [place] [address] [place], [place] [address]     Radiology Reports Ct Head Wo Contrast  Result Date: 07/04/2019 CLINICAL DATA:  Mental status. Fall 2 days ago. Positive COVID-25 May 2019. EXAM: CT HEAD WITHOUT CONTRAST TECHNIQUE: Contiguous axial images were obtained from the base of the skull through the vertex without intravenous contrast. COMPARISON:  None. FINDINGS: Brain: Ventricles, cisterns and other CSF spaces are normal. There is mild chronic ischemic microvascular disease present. There is no mass, mass effect, shift of midline structures or acute hemorrhage. No evidence of acute infarction. Vascular: No hyperdense vessel or unexpected calcification. Skull: Normal. Negative for fracture or focal lesion.  Sinuses/Orbits: No acute finding. Other: None. IMPRESSION: No acute findings. Mild chronic ischemic microvascular disease. Electronically Signed   By: Marin Olp M.D.   On: 07/04/2019 07:11   Dg Chest Port 1 View  Result Date: 07/11/2019 CLINICAL DATA:  Evaluate PICC tip. EXAM: PORTABLE CHEST 1 VIEW COMPARISON:  Chest x-ray from same day. FINDINGS: Interval retraction of the right upper extremity PICC line with the tip now in the distal SVC. Stable  cardiomediastinal silhouette. Normal pulmonary vascularity. Continued low lung volumes with mild bibasilar atelectasis, slightly improved. No focal consolidation, pleural effusion, or pneumothorax. No acute osseous abnormality. IMPRESSION: 1. Right upper extremity PICC line tip now in the distal SVC. 2. Continued low lung volumes with mildly improved bibasilar atelectasis. Electronically Signed   By: Titus Dubin M.D.   On: 07/11/2019 18:41   Dg Chest Port 1 View  Result Date: 07/11/2019 CLINICAL DATA:  PICC placement EXAM: PORTABLE CHEST 1 VIEW COMPARISON:  07/04/2019 FINDINGS: Right arm PICC is been placed. Tip in the mid right atrium. Recommend withdrawal of 3 cm for optimum position Hypoventilation with bibasilar atelectasis.  No effusion or edema. IMPRESSION: PICC tip in the mid right atrium.  Recommend withdrawal 3 cm Hypoventilation with bibasilar atelectasis. Electronically Signed   By: Franchot Gallo M.D.   On: 07/11/2019 17:30   Dg Chest Port 1 View  Result Date: 07/04/2019 CLINICAL DATA:  Altered mental status and hypotension EXAM: PORTABLE CHEST 1 VIEW COMPARISON:  06/03/2019 FINDINGS: Mild cardiomegaly accentuated by low volumes. Otherwise normal mediastinal contours. There is no edema, consolidation, effusion, or pneumothorax. Postoperative proximal left humerus. IMPRESSION: No evidence of acute disease. Electronically Signed   By: Monte Fantasia M.D.   On: 07/04/2019 05:26   Dg Abd Portable 1v  Result Date: 07/11/2019 CLINICAL DATA:   Diarrhea and C diff EXAM: PORTABLE ABDOMEN - 1 VIEW COMPARISON:  None. FINDINGS: Gas-filled loops of colon within normal limits. No dilated loops of Corsi bowel. Gas the rectum. IMPRESSION: Gas-filled colon. No evidence obstruction or inflammation by plain film imaging Electronically Signed   By: Suzy Bouchard M.D.   On: 07/11/2019 11:32   [name] Ekg Site Rite  Result Date: 07/11/2019 If Site Rite image not attached, placement could not be confirmed due to current cardiac rhythm.

## 2019-07-17 NOTE — Progress Notes (Signed)
Patient not feeling well. Had episodes of nausea and vomiting last night. Having decreased appetite.

## 2019-07-18 DIAGNOSIS — U071 COVID-19: Secondary | ICD-10-CM | POA: Diagnosis not present

## 2019-07-18 DIAGNOSIS — G9349 Other encephalopathy: Secondary | ICD-10-CM | POA: Diagnosis not present

## 2019-07-18 LAB — GLUCOSE, CAPILLARY
Glucose-Capillary: 89 mg/dL (ref 70–99)
Glucose-Capillary: 118 mg/dL — ABNORMAL HIGH (ref 70–99)
Glucose-Capillary: 147 mg/dL — ABNORMAL HIGH (ref 70–99)
Glucose-Capillary: 96 mg/dL (ref 70–99)

## 2019-07-18 MED ORDER — CHOLESTYRAMINE LIGHT 4 G PO PACK
4.0000 g | PACK | ORAL | Status: AC
  Administered 2019-07-19 – 2019-07-20 (×2): 4 g via ORAL
  Filled 2019-07-18 (×2): qty 1

## 2019-07-18 MED ORDER — LACTATED RINGERS IV SOLN
INTRAVENOUS | Status: DC
  Administered 2019-07-18: 09:00:00 via INTRAVENOUS

## 2019-07-18 MED ORDER — CHOLESTYRAMINE LIGHT 4 G PO PACK
4.0000 g | PACK | Freq: Once | ORAL | Status: AC
  Administered 2019-07-18: 4 g via ORAL
  Filled 2019-07-18: qty 1

## 2019-07-18 MED ORDER — SODIUM CHLORIDE 0.9 % IV BOLUS
500.0000 mL | Freq: Once | INTRAVENOUS | Status: AC
  Administered 2019-07-18: 08:00:00 500 mL via INTRAVENOUS

## 2019-07-18 MED ORDER — LACTATED RINGERS IV SOLN
INTRAVENOUS | Status: DC
  Administered 2019-07-18: 12:00:00 via INTRAVENOUS

## 2019-07-18 NOTE — Progress Notes (Signed)
PROGRESS NOTE                                                                                                                                                                                                             Patient Demographics:    Alice Kemp, is a 68 y.o. female, DOB - 16-Jul-1951, [id number]  Outpatient Primary MD for the patient is [name], MD   Admit date - 07/04/2019   LOS - 30  Chief Complaint  Patient presents with  . Fall       Brief Narrative: Patient is a 68 y.o. female with PMHx of hypothyroidism, morbid obesity, dyslipidemia, DM, chronic diastolic heart failure, CKD stage III who was hospitalized in August for spinal stenosis-she underwent a lumbar laminectomy-she was incidentally found to have COVID-19 during that hospitalization.  She presented to the ED for evaluation of fall along with profuse diarrhea-she was subsequently found to have C. difficile colitis along with severe dehydration and AKI.  She again was found to have COVID-19.  She was subsequently admitted to the hospitalist service.   Subjective:   Patient in bed, appears comfortable, denies any headache, no fever, no chest pain or pressure, no shortness of breath , no abdominal pain. No focal weakness.  Will having diarrhea.    Assessment  & Plan :   COVID-19 infection: Incidental finding-without any respiratory symptoms-GI symptoms are from C. difficile colitis.  Severe C. difficile colitis: Improved-diarrhea has slowed down significantly-and stools are getting formed as well.  Continue IV Flagyl and p.o. Vancomycin.  PRN Questran, case was discussed by me with GI physician Dr. Lyndel Safe and ID physician Dr. Alton Revere.  Diarrhea seems to be worsening again around 07/18/2019, continue hydration as she continues to get dehydrated, will add Dificid if not improved by 07/19/2019.  AKI: Hemodynamically mediated-in the setting of  diarrhea-resolved.    Hypotension: Due to volume loss from diarrhea, IV fluid bolus and maintenance ordered again on 07/18/2019 due to persistent hypotension, continue midodrine and monitor.  Hypokalemia/hypomagnesemia/hypocalcemia: Replete potassium and magnesium-corrected calcium is in the low normal range.  Depression: Stable-continue amitriptyline, Wellbutrin and Cymbalta  Hypothyroidism: Continue Synthroid   Chronic diastolic heart failure: Continues to have lower extremity edema-probably from hypoalbuminemia rather than decompensated heart failure-repeat 1 dose of Lasix today.  Hypertension: Given hypotension-continue to hold all antihypertensives.  Dyslipidemia: Continue with Synthroid  Stage II sacral decubitus ulcer: Continue per wound care.   Recent L-spine spinal stenosis surgery last month on 05/30/2019 by Dr. Saintclair Halsted.  Per patient-while at [facility] after her discharge from the hospital in August-she was slowly improving and was just about able to get up with assistance and ambulate a few steps with assistance.  She is able to lift both of her legs off the bed-and just about able to bend them at the knee   Obesity: BMI of 42 follow with PCP for weight loss.  Debility/deconditioning: Sounds like she has chronic debility at baseline from recent surgery/spondylolisthesis-but it seems like she has worsened after some improvement in function after her recent stay at SNF.  Suspect worsening is from acute illness associated debility/deconditioning due to severe C. difficile colitis.  DM-2: CBGs stable-continue Lantus 10 units and SSI.  Poor outpatient control due to hyperglycemia.  Lab Results  Component Value Date   HGBA1C 8.9 (H) 05/24/2019   CBG (last 3)  Recent Labs    07/17/19 1737 07/17/19 2123 07/18/19 0719  GLUCAP 150* 136* 1     Condition - Stable  Family Communication  : Brother and mother were updated by me personally few days into admission.  Code Status :  DNR   Diet :   Diet Order            DIET DYS 3 Room service appropriate? Yes; Fluid consistency: Thin  Diet effective now               Disposition Plan  :  Remain hospitalized-SNF probably early next week  Barriers to discharge: Diarrhea-electrolyte abnormalities  Consults  : Over the phone GI Dr.Gupta and ID Dr. Linus Salmons were consulted.  Procedures  :  None  Antibiotics  :    Anti-infectives (From admission, onward)   Start     Dose/Rate Route Frequency Ordered Stop   07/15/19 1400  metroNIDAZOLE (FLAGYL) IVPB 500 mg     500 mg 100 mL/hr over 60 Minutes Intravenous Every 8 hours 07/15/19 0953     07/13/19 1000  metroNIDAZOLE (FLAGYL) tablet 500 mg  Status:  Discontinued     500 mg Oral Every 8 hours 07/13/19 0952 07/15/19 0953   07/12/19 0400  metroNIDAZOLE (FLAGYL) IVPB 500 mg  Status:  Discontinued     500 mg 100 mL/hr over 60 Minutes Intravenous Every 8 hours 07/11/19 1935 07/13/19 0952   07/11/19 1200  metroNIDAZOLE (FLAGYL) IVPB 500 mg  Status:  Discontinued     500 mg 100 mL/hr over 60 Minutes Intravenous Every 8 hours 07/11/19 1055 07/11/19 1935   07/09/19 2130  vancomycin (VANCOCIN) 50 mg/mL oral solution 500 mg  Status:  Discontinued     500 mg Oral Every 6 hours 07/08/19 2104 07/08/19 2146   07/08/19 2200  vancomycin (VANCOCIN) 50 mg/mL oral solution 500 mg     500 mg Oral Every 6 hours 07/08/19 2146     07/05/19 1600  remdesivir 100 mg in sodium chloride 0.9 % 250 mL IVPB  Status:  Discontinued     100 mg 500 mL/hr over 30 Minutes Intravenous Every 24 hours 07/04/19 1517 07/05/19 0734   07/04/19 1600  remdesivir 200 mg in sodium chloride 0.9 % 250 mL IVPB     200 mg 500 mL/hr over 30 Minutes Intravenous Once 07/04/19 1517 07/04/19 1941   07/04/19 1400  vancomycin (VANCOCIN) 50 mg/mL oral solution 500 mg  Status:  Discontinued  500 mg Oral Every 6 hours 07/04/19 1134 07/08/19 2104      Inpatient Medications  Scheduled Meds: . aspirin  81 mg Oral Daily  .  buPROPion  100 mg Oral BID  . Chlorhexidine Gluconate Cloth  6 each Topical Daily  . cholestyramine light  4 g Oral Once  . [START ON 07/19/2019] cholestyramine light  4 g Oral Q24H  . DULoxetine  30 mg Oral BID  . enoxaparin (LOVENOX) injection  55 mg Subcutaneous Q24H  . feeding supplement (ENSURE ENLIVE)  237 mL Oral TID BM  . feeding supplement (PRO-STAT SUGAR FREE 64)  30 mL Oral TID WC  . gabapentin  300 mg Oral TID  . Gerhardt's butt cream  1 application Topical TID  . insulin aspart  0-15 Units Subcutaneous TID WC  . insulin aspart  0-5 Units Subcutaneous QHS  . insulin glargine  10 Units Subcutaneous Daily  . levothyroxine  88 mcg Oral QAC breakfast  . midodrine  10 mg Oral BID WC  . multivitamin with minerals  1 tablet Oral Daily  . rosuvastatin  5 mg Oral Daily  . sodium chloride flush  10-40 mL Intracatheter Q12H  . vancomycin  500 mg Oral Q6H   Continuous Infusions: . metronidazole 500 mg (07/18/19 0554)   PRN Meds:.acetaminophen, magic mouthwash w/lidocaine, [DISCONTINUED] ondansetron **OR** ondansetron (ZOFRAN) IV, promethazine   Time Spent in minutes  25  See all Orders from today for further details   Lala Lund M.D on 07/18/2019 at 9:56 AM  To page go to [email or web address] - use universal password  [facility] -  Office  [phone]    Objective:   Vitals:   07/17/19 1624 07/17/19 1942 07/18/19 0500 07/18/19 0635  BP: 109/70 112/62  (!) 88/54  Pulse: 80     Resp:  20  16  Temp: 97.6 F (36.4 C) 98.7 F (37.1 C)  98.4 F (36.9 C)  TempSrc: Oral Oral  Oral  SpO2: 96% 95%    Weight:   113 kg   Height:        Wt Readings from Last 3 Encounters:  07/18/19 113 kg  06/05/19 117.8 kg  12/14/18 112.5 kg     Intake/Output Summary (Last 24 hours) at 07/18/2019 0956 Last data filed at 07/18/2019 0839 Gross per 24 hour  Intake 2644.69 ml  Output 150 ml  Net 2494.69 ml     Physical Exam  Awake Alert, Oriented X 3, No new F.N deficits,  lower extremity strength 4/5 bilateral - chronic,  Normal affect [place].AT,PERRAL Supple Neck,No JVD, No cervical lymphadenopathy appriciated.  Symmetrical Chest wall movement, Good air movement bilaterally, CTAB RRR,No Gallops, Rubs or new Murmurs, No Parasternal Heave +ve B.Sounds, Abd Soft, No tenderness, No organomegaly appriciated, No rebound - guarding or rigidity. No Cyanosis, Clubbing or edema, No new Rash or bruise     Data Review:    CBC Recent Labs  Lab 07/12/19 0500  07/13/19 0400 07/14/19 0616 07/15/19 0655 07/16/19 0819 07/17/19 0645  WBC 11.4*  --  10.5 8.8 8.6 10.4 8.2  HGB 7.1*   < > 8.9* 8.4* 8.2* 8.9* 8.5*  HCT 23.4*   < > 29.4* 27.8* 27.2* 29.8* 28.4*  PLT 290  --  346 314 280 312 319  MCV 90.7  --  89.9 89.4 91.3 92.0 91.0  MCH 27.5  --  27.2 27.0 27.5 27.5 27.2  MCHC 30.3  --  30.3 30.2 30.1 29.9* 29.9*  RDW 17.2*  --  17.2* 17.3* 17.4* 17.7* 18.1*  LYMPHSABS 3.4  --  3.6  --   --   --   --   MONOABS 1.0  --  1.0  --   --   --   --   EOSABS 0.1  --  0.2  --   --   --   --   BASOSABS 0.0  --  0.0  --   --   --   --    < > = values in this interval not displayed.    Chemistries  Recent Labs  Lab 07/12/19 0500 07/13/19 0400 07/14/19 0616 07/15/19 0655 07/16/19 0819 07/17/19 0645  NA 132* 134* 134* 138 139 138  K 4.0 3.5 3.4* 3.6 3.2* 4.3  CL 101 102 104 105 112* 109  CO2 24 24 23 24 22 25   GLUCOSE 175* 122* 82 138* 99 115*  BUN 73* 56* 45* 37* 27* 27*  CREATININE 1.50* 1.21* 1.18* 0.91 0.86 0.99  CALCIUM 6.9* 7.0* 6.9* 7.1* 6.1* 7.2*  MG 3.2* 1.7  --   --  1.3* 2.1  AST 61* 43*  --  35 26 29  ALT 27 26  --  20 17 17   ALKPHOS 358* 335*  --  349* 274* 282*  BILITOT 0.6 0.5  --  0.3 0.4 0.5   ------------------------------------------------------------------------------------------------------------------ No results for input(s): CHOL, HDL, LDLCALC, TRIG, CHOLHDL, LDLDIRECT in the last 72 hours.  Lab Results  Component Value Date   HGBA1C  8.9 (H) 05/24/2019   ------------------------------------------------------------------------------------------------------------------ No results for input(s): TSH, T4TOTAL, T3FREE, THYROIDAB in the last 72 hours.  Invalid input(s): FREET3 ------------------------------------------------------------------------------------------------------------------ No results for input(s): VITAMINB12, FOLATE, FERRITIN, TIBC, IRON, RETICCTPCT in the last 72 hours.  Coagulation profile No results for input(s): INR, PROTIME in the last 168 hours.  No results for input(s): DDIMER in the last 72 hours.  Cardiac Enzymes No results for input(s): CKMB, TROPONINI, MYOGLOBIN in the last 168 hours.  Invalid input(s): CK ------------------------------------------------------------------------------------------------------------------    Component Value Date/Time   BNP 148.7 (H) 07/13/2019 0400    Micro Results No results found for this or any previous visit (from the past 240 hour(s)).  Radiology Reports Ct Head Wo Contrast  Result Date: 07/04/2019 CLINICAL DATA:  Mental status. Fall 2 days ago. Positive COVID-25 May 2019. EXAM: CT HEAD WITHOUT CONTRAST TECHNIQUE: Contiguous axial images were obtained from the base of the skull through the vertex without intravenous contrast. COMPARISON:  None. FINDINGS: Brain: Ventricles, cisterns and other CSF spaces are normal. There is mild chronic ischemic microvascular disease present. There is no mass, mass effect, shift of midline structures or acute hemorrhage. No evidence of acute infarction. Vascular: No hyperdense vessel or unexpected calcification. Skull: Normal. Negative for fracture or focal lesion. Sinuses/Orbits: No acute finding. Other: None. IMPRESSION: No acute findings. Mild chronic ischemic microvascular disease. Electronically Signed   By: Marin Olp M.D.   On: 07/04/2019 07:11   Dg Chest Port 1 View  Result Date: 07/11/2019 CLINICAL DATA:   Evaluate PICC tip. EXAM: PORTABLE CHEST 1 VIEW COMPARISON:  Chest x-ray from same day. FINDINGS: Interval retraction of the right upper extremity PICC line with the tip now in the distal SVC. Stable cardiomediastinal silhouette. Normal pulmonary vascularity. Continued low lung volumes with mild bibasilar atelectasis, slightly improved. No focal consolidation, pleural effusion, or pneumothorax. No acute osseous abnormality. IMPRESSION: 1. Right upper extremity PICC line tip now in the distal SVC. 2. Continued low lung volumes with mildly improved  bibasilar atelectasis. Electronically Signed   By: Titus Dubin M.D.   On: 07/11/2019 18:41   Dg Chest Port 1 View  Result Date: 07/11/2019 CLINICAL DATA:  PICC placement EXAM: PORTABLE CHEST 1 VIEW COMPARISON:  07/04/2019 FINDINGS: Right arm PICC is been placed. Tip in the mid right atrium. Recommend withdrawal of 3 cm for optimum position Hypoventilation with bibasilar atelectasis.  No effusion or edema. IMPRESSION: PICC tip in the mid right atrium.  Recommend withdrawal 3 cm Hypoventilation with bibasilar atelectasis. Electronically Signed   By: Franchot Gallo M.D.   On: 07/11/2019 17:30   Dg Chest Port 1 View  Result Date: 07/04/2019 CLINICAL DATA:  Altered mental status and hypotension EXAM: PORTABLE CHEST 1 VIEW COMPARISON:  06/03/2019 FINDINGS: Mild cardiomegaly accentuated by low volumes. Otherwise normal mediastinal contours. There is no edema, consolidation, effusion, or pneumothorax. Postoperative proximal left humerus. IMPRESSION: No evidence of acute disease. Electronically Signed   By: Monte Fantasia M.D.   On: 07/04/2019 05:26   Dg Abd Portable 1v  Result Date: 07/11/2019 CLINICAL DATA:  Diarrhea and C diff EXAM: PORTABLE ABDOMEN - 1 VIEW COMPARISON:  None. FINDINGS: Gas-filled loops of colon within normal limits. No dilated loops of [place] bowel. Gas the rectum. IMPRESSION: Gas-filled colon. No evidence obstruction or inflammation by plain film  imaging Electronically Signed   By: Suzy Bouchard M.D.   On: 07/11/2019 11:32   [name] Ekg Site Rite  Result Date: 07/11/2019 If Site Rite image not attached, placement could not be confirmed due to current cardiac rhythm.

## 2019-07-18 NOTE — Progress Notes (Signed)
Ok to change Questran to qday per Dr. Candiss Norse due to major drug interactions with her other meds including PO vanc.  Onnie Boer, PharmD, BCIDP, AAHIVP, CPP Infectious Disease Pharmacist 07/18/2019 9:11 AM

## 2019-07-18 NOTE — Progress Notes (Signed)
  Speech Language Pathology Treatment: Dysphagia  Patient Details Name: KEYOSHA TIEDT MRN: [id number] DOB: 1951-01-02 Today's Date: 07/18/2019 Time: 2202-5427 SLP Time Calculation (min) (ACUTE ONLY): 10 min  Assessment / Plan / Recommendation Clinical Impression  Pt's mentation has improved compared to last week and she is self-feeding again. She has mildly prolonged mastication and still has subjective c/o some of the foods being too hard to chew, but she takes her time and manages soft solids well. We discussed the other options for softer diets, but she does not want to go any softer than mechanical soft because she is "all about presentation" and her oral intake is already reduced. Recommend continuing with mechanical soft diet, assisting with any cutting of food PRN and allowing pt to eat the soft solids she feels like she can masticate. She has no other questions or complaints at this time. SLP to sign off acutely. Please reorder if we can assist.    HPI HPI: Pt is a 68 yo female presenting with AMS from SNF, where she went last month after lumbar decompression surgery. She tested positive for COVID-19 (of note, pt reportedly tested positive in June; chart indicates positive test 8/18 and nmegative test 8/29). PMH: OSA, HLD, glaucoma, HF, DM, depression, CKD, back pain, anxiety      SLP Plan  All goals met       Recommendations  Diet recommendations: Dysphagia 3 (mechanical soft);Thin liquid Liquids provided via: Cup;Straw Medication Administration: Whole meds with puree Supervision: Staff to assist with self feeding Compensations: Slow rate;Enright sips/bites Postural Changes and/or Swallow Maneuvers: Seated upright 90 degrees;Upright 30-60 min after meal                Oral Care Recommendations: Oral care BID Follow up Recommendations: Skilled Nursing facility SLP Visit Diagnosis: Dysphagia, unspecified (R13.10) Plan: All goals met       GO                Venita Sheffield  Ailin Rochford 07/18/2019, 11:54 AM  Pollyann Glen, M.A. [place] Acute Environmental education officer [phone] Office [phone]

## 2019-07-19 DIAGNOSIS — G9349 Other encephalopathy: Secondary | ICD-10-CM | POA: Diagnosis not present

## 2019-07-19 DIAGNOSIS — U071 COVID-19: Secondary | ICD-10-CM | POA: Diagnosis not present

## 2019-07-19 LAB — CBC WITH DIFFERENTIAL/PLATELET
Abs Immature Granulocytes: 0.04 10*3/uL (ref 0.00–0.07)
Basophils Absolute: 0.1 10*3/uL (ref 0.0–0.1)
Basophils Relative: 1 %
Eosinophils Absolute: 0.2 10*3/uL (ref 0.0–0.5)
Eosinophils Relative: 3 %
HCT: 28 % — ABNORMAL LOW (ref 36.0–46.0)
Hemoglobin: 8.4 g/dL — ABNORMAL LOW (ref 12.0–15.0)
Immature Granulocytes: 1 %
Lymphocytes Relative: 46 %
Lymphs Abs: 3 10*3/uL (ref 0.7–4.0)
MCH: 27.6 pg (ref 26.0–34.0)
MCHC: 30 g/dL (ref 30.0–36.0)
MCV: 92.1 fL (ref 80.0–100.0)
Monocytes Absolute: 0.6 10*3/uL (ref 0.1–1.0)
Monocytes Relative: 9 %
Neutro Abs: 2.6 10*3/uL (ref 1.7–7.7)
Neutrophils Relative %: 40 %
Platelets: 284 10*3/uL (ref 150–400)
RBC: 3.04 MIL/uL — ABNORMAL LOW (ref 3.87–5.11)
RDW: 18.8 % — ABNORMAL HIGH (ref 11.5–15.5)
WBC: 6.4 10*3/uL (ref 4.0–10.5)
nRBC: 0 % (ref 0.0–0.2)

## 2019-07-19 LAB — COMPREHENSIVE METABOLIC PANEL
ALT: 18 U/L (ref 0–44)
AST: 31 U/L (ref 15–41)
Albumin: 1.3 g/dL — ABNORMAL LOW (ref 3.5–5.0)
Alkaline Phosphatase: 227 U/L — ABNORMAL HIGH (ref 38–126)
Anion gap: 6 (ref 5–15)
BUN: 17 mg/dL (ref 8–23)
CO2: 26 mmol/L (ref 22–32)
Calcium: 7.3 mg/dL — ABNORMAL LOW (ref 8.9–10.3)
Chloride: 106 mmol/L (ref 98–111)
Creatinine, Ser: 0.87 mg/dL (ref 0.44–1.00)
GFR calc Af Amer: >60 mL/min (ref >60)
GFR calc non Af Amer: >60 mL/min (ref >60)
Glucose, Bld: 74 mg/dL (ref 70–99)
Potassium: 3.8 mmol/L (ref 3.5–5.1)
Sodium: 138 mmol/L (ref 135–145)
Total Bilirubin: 0.6 mg/dL (ref 0.3–1.2)
Total Protein: 4.7 g/dL — ABNORMAL LOW (ref 6.5–8.1)

## 2019-07-19 LAB — GLUCOSE, CAPILLARY
Glucose-Capillary: 59 mg/dL — ABNORMAL LOW (ref 70–99)
Glucose-Capillary: 128 mg/dL — ABNORMAL HIGH (ref 70–99)
Glucose-Capillary: 142 mg/dL — ABNORMAL HIGH (ref 70–99)
Glucose-Capillary: 121 mg/dL — ABNORMAL HIGH (ref 70–99)

## 2019-07-19 LAB — MAGNESIUM: Magnesium: 1.6 mg/dL — ABNORMAL LOW (ref 1.7–2.4)

## 2019-07-19 MED ORDER — LACTATED RINGERS IV SOLN
INTRAVENOUS | Status: AC
  Administered 2019-07-19: 09:00:00 via INTRAVENOUS

## 2019-07-19 MED ORDER — POTASSIUM CHLORIDE CRYS ER 20 MEQ PO TBCR
20.0000 meq | EXTENDED_RELEASE_TABLET | Freq: Once | ORAL | Status: AC
  Administered 2019-07-19: 20 meq via ORAL
  Filled 2019-07-19: qty 1

## 2019-07-19 MED ORDER — MAGNESIUM SULFATE 2 GM/50ML IV SOLN
2.0000 g | Freq: Once | INTRAVENOUS | Status: AC
  Administered 2019-07-19: 12:00:00 2 g via INTRAVENOUS
  Filled 2019-07-19: qty 50

## 2019-07-19 NOTE — Progress Notes (Signed)
`Occupational Therapy Treatment Patient Details Name: Alice Kemp MRN: [id number] DOB: 04-Jul-1951 Today's Date: 07/19/2019    History of present illness 68 y/o female pt w/ hx of anxiety, arthritis, back pain, benign neoplasm of colon, breast abscess, cervical radiculopathy, CKD III, depression, diastolic HF, DM II, glaucoma, gout, HLD, morbid obesity, obstructive sleep apnea, pericardial effusion, pulmonary HTN, hx of COVID in 06/20. pt underwent decompressive laminectomy L3 partial inferior laminectomy L2 and microscopic discectomy from the left and foraminotomies of the left L3 nerve root on 05/30/19. She had a fall on 07/02/2019, admitted with AMS, hypotension; C-diff.    OT comments  Pt making slow progress towards OT goals. Pt tolerating sitting EOB >10 min during this session with minguard-supervision for sitting balance provided. Pt engaging in groom ADL and LE exercise while sitting upright. Pt also requiring totalA for pericare/changing of bed linens start of session due to incontinent bowel/bladder. Pt with improved cognition/awareness today. VSS throughout. Feel POC remains appropriate at this time. Will continue to follow acutely.   Follow Up Recommendations  SNF;Supervision/Assistance - 24 hour    Equipment Recommendations  None recommended by OT          Precautions / Restrictions Precautions Precautions: Fall Precaution Comments: incontinence, low BP Restrictions Weight Bearing Restrictions: No       Mobility Bed Mobility Overal bed mobility: Needs Assistance Bed Mobility: Rolling;Supine to Sit;Sit to Supine Rolling: Max assist;+2 for physical assistance   Supine to sit: Max assist;+2 for physical assistance;+2 for safety/equipment Sit to supine: Max assist;+2 for physical assistance;+2 for safety/equipment   General bed mobility comments: rolling to L/R for pericare/changing bed linens, pt with good initiation but continues to require +2 assist to carry out  mobility tasks  Transfers                 General transfer comment: deferred, discussed options for attempting to stand during next OT/PT cotx    Balance Overall balance assessment: Needs assistance Sitting-balance support: Feet supported Sitting balance-Leahy Scale: Good Sitting balance - Comments: supervision - minguard while seated EOB, placed folded floor mat under pt's feet for LE support due to short stature pt's feet not able to fully reach the floor                                   ADL either performed or assessed with clinical judgement   ADL Overall ADL's : Needs assistance/impaired     Grooming: Brushing hair;Set up;Min guard;Sitting Grooming Details (indicate cue type and reason): sitting EOB                     Toileting- Clothing Manipulation and Hygiene: Total assistance;+2 for safety/equipment;+2 for physical assistance;Bed level Toileting - Clothing Manipulation Details (indicate cue type and reason): pt incontinent of b/b at start of session, requiring maxA for bed mobility and totalA for pericare     Functional mobility during ADLs: Maximal assistance;+2 for physical assistance;+2 for safety/equipment(bed mobility) General ADL Comments: pt with improved tolerance to sitting EOB today (approx >10 min)      Vision       Perception     Praxis      Cognition Arousal/Alertness: Awake/alert Behavior During Therapy: WFL for tasks assessed/performed Overall Cognitive Status: Impaired/Different from baseline  Following Commands: Follows one step commands with increased time   Awareness: Emergent Problem Solving: Slow processing General Comments: pt with improved mentation from previous sessions        Exercises Exercises: General Lower Extremity General Exercises - Lower Extremity Ankle Circles/Pumps: AROM;Both;10 reps;Seated Long Arc Quad: AROM;Both;10 reps;Seated   Shoulder  Instructions       General Comments VSS    Pertinent Vitals/ Pain       Pain Assessment: Faces Faces Pain Scale: Hurts little more Pain Location: sacral region Pain Descriptors / Indicators: Discomfort;Grimacing;Sore  Home Living                                          Prior Functioning/Environment              Frequency  Min 2X/week        Progress Toward Goals  OT Goals(current goals can now be found in the care plan section)  Progress towards OT goals: Progressing toward goals  Acute Rehab OT Goals Patient Stated Goal: to get back home to her mom and dogs OT Goal Formulation: With patient Time For Goal Achievement: 07/21/19 Potential to Achieve Goals: Good  Plan Discharge plan remains appropriate    Co-evaluation    PT/OT/SLP Co-Evaluation/Treatment: Yes Reason for Co-Treatment: Complexity of the patient's impairments (multi-system involvement);For patient/therapist safety;To address functional/ADL transfers;Necessary to address cognition/behavior during functional activity   OT goals addressed during session: ADL's and self-care      AM-PAC OT "6 Clicks" Daily Activity     Outcome Measure   Help from another person eating meals?: A Little Help from another person taking care of personal grooming?: A Little Help from another person toileting, which includes using toliet, bedpan, or urinal?: Total Help from another person bathing (including washing, rinsing, drying)?: A Lot Help from another person to put on and taking off regular upper body clothing?: A Lot Help from another person to put on and taking off regular lower body clothing?: Total 6 Click Score: 12    End of Session    OT Visit Diagnosis: Other abnormalities of gait and mobility (R26.89);Muscle weakness (generalized) (M62.81);Other symptoms and signs involving cognitive function;Pain Pain - part of body: (sacral region)   Activity Tolerance Patient tolerated treatment  well   Patient Left in bed;with call bell/phone within reach   Nurse Communication Mobility status;Need for lift equipment        Time: [id number] OT Time Calculation (min): 43 min  Charges: OT General Charges $OT Visit: 1 Visit OT Treatments $Self Care/Home Management : 8-22 mins  Lou Cal, OT Supplemental Rehabilitation Services Pager [phone] Office [phone]    Raymondo Band 07/19/2019, 4:34 PM

## 2019-07-19 NOTE — Progress Notes (Signed)
PROGRESS NOTE                                                                                                                                                                                                             Patient Demographics:    Alice Kemp, is a 68 y.o. female, DOB - 02-Mar-1951, [id number]  Outpatient Primary MD for the patient is [name], MD   Admit date - 07/04/2019   LOS - 60  Chief Complaint  Patient presents with  . Fall       Brief Narrative: Patient is a 68 y.o. female with PMHx of hypothyroidism, morbid obesity, dyslipidemia, DM, chronic diastolic heart failure, CKD stage III who was hospitalized in August for spinal stenosis-she underwent a lumbar laminectomy-she was incidentally found to have COVID-19 during that hospitalization.  She presented to the ED for evaluation of fall along with profuse diarrhea-she was subsequently found to have C. difficile colitis along with severe dehydration and AKI.  She again was found to have COVID-19.  She was subsequently admitted to the hospitalist service.   Subjective:   Patient in bed, appears comfortable, denies any headache, no fever, no chest pain or pressure, no shortness of breath , no abdominal pain. No focal weakness, chronic bilateral lower extremity weakness continues.    Assessment  & Plan :   COVID-19 infection: Incidental finding-without any respiratory symptoms-GI symptoms are from C. difficile colitis.  Severe C. difficile colitis: Improved-diarrhea has slowed down significantly-and stools are getting formed as well.  Continue IV Flagyl and p.o. Vancomycin.  PRN Questran, case was discussed by me with GI physician Dr. Lyndel Safe and ID physician Dr. Alton Revere.  Diarrhea seems to be worsening again around 07/18/2019, continue hydration as she continues to get dehydrated, will add Dificid if after 2 doses of cholestyramine her diarrhea reoccurs on  07/20/2019.  AKI: Hemodynamically mediated-in the setting of diarrhea-resolved.    Hypotension: Due to volume loss from diarrhea, IV fluid bolus and maintenance ordered again on 07/18/2019 due to persistent hypotension, continue midodrine and monitor.  Hypokalemia/hypomagnesemia/hypocalcemia: Replete potassium and magnesium-corrected calcium is in the low normal range.  Depression: Stable-continue amitriptyline, Wellbutrin and Cymbalta  Hypothyroidism: Continue Synthroid   Chronic diastolic heart failure: Continues to have lower extremity edema-probably from hypoalbuminemia rather than decompensated heart failure-repeat 1 dose of Lasix today.  Hypertension: Given hypotension-continue to hold all antihypertensives.  Dyslipidemia: Continue with Synthroid  Stage II sacral decubitus ulcer: Continue per wound care.   Recent L-spine spinal stenosis surgery last month on 05/30/2019 by Dr. Saintclair Halsted.  Per patient-while at [facility] after her discharge from the hospital in August-she was slowly improving and was just about able to get up with assistance and ambulate a few steps with assistance.  She is able to lift both of her legs off the bed-and just about able to bend them at the knee   Obesity: BMI of 42 follow with PCP for weight loss.  Debility/deconditioning: Sounds like she has chronic debility at baseline from recent surgery/spondylolisthesis-but it seems like she has worsened after some improvement in function after her recent stay at SNF.  Suspect worsening is from acute illness associated debility/deconditioning due to severe C. difficile colitis.  DM-2: CBGs stable-continue Lantus 10 units and SSI.  Poor outpatient control due to hyperglycemia.  Lab Results  Component Value Date   HGBA1C 8.9 (H) 05/24/2019   CBG (last 3)  Recent Labs    07/18/19 1641 07/18/19 2115 07/19/19 0828  GLUCAP 147* 96 62*     Condition - Stable  Family Communication  : Brother and mother were updated by  me personally few days into admission.  Code Status :  DNR  Diet :   Diet Order            DIET DYS 3 Room service appropriate? Yes; Fluid consistency: Thin  Diet effective now               Disposition Plan  :  Remain hospitalized-SNF probably early next week  Barriers to discharge: Diarrhea-electrolyte abnormalities  Consults  : Over the phone GI Dr.Gupta and ID Dr. Linus Salmons were consulted.  Procedures  :  None  Antibiotics  :    Anti-infectives (From admission, onward)   Start     Dose/Rate Route Frequency Ordered Stop   07/15/19 1400  metroNIDAZOLE (FLAGYL) IVPB 500 mg     500 mg 100 mL/hr over 60 Minutes Intravenous Every 8 hours 07/15/19 0953     07/13/19 1000  metroNIDAZOLE (FLAGYL) tablet 500 mg  Status:  Discontinued     500 mg Oral Every 8 hours 07/13/19 0952 07/15/19 0953   07/12/19 0400  metroNIDAZOLE (FLAGYL) IVPB 500 mg  Status:  Discontinued     500 mg 100 mL/hr over 60 Minutes Intravenous Every 8 hours 07/11/19 1935 07/13/19 0952   07/11/19 1200  metroNIDAZOLE (FLAGYL) IVPB 500 mg  Status:  Discontinued     500 mg 100 mL/hr over 60 Minutes Intravenous Every 8 hours 07/11/19 1055 07/11/19 1935   07/09/19 2130  vancomycin (VANCOCIN) 50 mg/mL oral solution 500 mg  Status:  Discontinued     500 mg Oral Every 6 hours 07/08/19 2104 07/08/19 2146   07/08/19 2200  vancomycin (VANCOCIN) 50 mg/mL oral solution 500 mg     500 mg Oral Every 6 hours 07/08/19 2146     07/05/19 1600  remdesivir 100 mg in sodium chloride 0.9 % 250 mL IVPB  Status:  Discontinued     100 mg 500 mL/hr over 30 Minutes Intravenous Every 24 hours 07/04/19 1517 07/05/19 0734   07/04/19 1600  remdesivir 200 mg in sodium chloride 0.9 % 250 mL IVPB     200 mg 500 mL/hr over 30 Minutes Intravenous Once 07/04/19 1517 07/04/19 1941   07/04/19 1400  vancomycin (VANCOCIN) 50 mg/mL oral  solution 500 mg  Status:  Discontinued     500 mg Oral Every 6 hours 07/04/19 1134 07/08/19 2104      Inpatient  Medications  Scheduled Meds: . aspirin  81 mg Oral Daily  . buPROPion  100 mg Oral BID  . Chlorhexidine Gluconate Cloth  6 each Topical Daily  . cholestyramine light  4 g Oral Q24H  . DULoxetine  30 mg Oral BID  . enoxaparin (LOVENOX) injection  55 mg Subcutaneous Q24H  . feeding supplement (ENSURE ENLIVE)  237 mL Oral TID BM  . feeding supplement (PRO-STAT SUGAR FREE 64)  30 mL Oral TID WC  . gabapentin  300 mg Oral TID  . Gerhardt's butt cream  1 application Topical TID  . insulin aspart  0-15 Units Subcutaneous TID WC  . insulin aspart  0-5 Units Subcutaneous QHS  . insulin glargine  10 Units Subcutaneous Daily  . levothyroxine  88 mcg Oral QAC breakfast  . midodrine  10 mg Oral BID WC  . multivitamin with minerals  1 tablet Oral Daily  . potassium chloride  20 mEq Oral Once  . rosuvastatin  5 mg Oral Daily  . sodium chloride flush  10-40 mL Intracatheter Q12H  . vancomycin  500 mg Oral Q6H   Continuous Infusions: . lactated ringers 100 mL/hr at 07/19/19 0913  . magnesium sulfate bolus IVPB    . metronidazole 500 mg (07/19/19 0556)   PRN Meds:.acetaminophen, magic mouthwash w/lidocaine, [DISCONTINUED] ondansetron **OR** ondansetron (ZOFRAN) IV, promethazine   Time Spent in minutes  25  See all Orders from today for further details   Lala Lund M.D on 07/19/2019 at 9:34 AM  To page go to [email or web address] - use universal password  [facility] -  Office  [phone]    Objective:   Vitals:   07/18/19 2128 07/19/19 0000 07/19/19 0448 07/19/19 0827  BP:  110/65 117/61 103/63  Pulse:  89 79 84  Resp:  16 18 16   Temp: 98.4 F (36.9 C) 98.4 F (36.9 C) 97.9 F (36.6 C) 97.8 F (36.6 C)  TempSrc: Oral Axillary Oral Oral  SpO2:  96% 97% 94%  Weight:   122 kg   Height:        Wt Readings from Last 3 Encounters:  07/19/19 122 kg  06/05/19 117.8 kg  12/14/18 112.5 kg     Intake/Output Summary (Last 24 hours) at 07/19/2019 0934 Last data filed at  07/19/2019 0700 Gross per 24 hour  Intake 1410.49 ml  Output 350 ml  Net 1060.49 ml     Physical Exam  Awake Alert, Oriented X 3, No new F.N deficits, lower extremity strength 4/5 bilateral - chronic,  Normal affect [place].AT,PERRAL Supple Neck,No JVD, No cervical lymphadenopathy appriciated.  Symmetrical Chest wall movement, Good air movement bilaterally, CTAB RRR,No Gallops, Rubs or new Murmurs, No Parasternal Heave +ve B.Sounds, Abd Soft, No tenderness, No organomegaly appriciated, No rebound - guarding or rigidity. No Cyanosis, Clubbing or edema, No new Rash or bruise    Data Review:    CBC Recent Labs  Lab 07/13/19 0400 07/14/19 0616 07/15/19 0655 07/16/19 0819 07/17/19 0645 07/19/19 0615  WBC 10.5 8.8 8.6 10.4 8.2 6.4  HGB 8.9* 8.4* 8.2* 8.9* 8.5* 8.4*  HCT 29.4* 27.8* 27.2* 29.8* 28.4* 28.0*  PLT 346 314 280 312 319 284  MCV 89.9 89.4 91.3 92.0 91.0 92.1  MCH 27.2 27.0 27.5 27.5 27.2 27.6  MCHC 30.3 30.2 30.1 29.9* 29.9* 30.0  RDW  17.2* 17.3* 17.4* 17.7* 18.1* 18.8*  LYMPHSABS 3.6  --   --   --   --  3.0  MONOABS 1.0  --   --   --   --  0.6  EOSABS 0.2  --   --   --   --  0.2  BASOSABS 0.0  --   --   --   --  0.1    Chemistries  Recent Labs  Lab 07/13/19 0400 07/14/19 0616 07/15/19 0655 07/16/19 0819 07/17/19 0645 07/19/19 0615  NA 134* 134* 138 139 138 138  K 3.5 3.4* 3.6 3.2* 4.3 3.8  CL 102 104 105 112* 109 106  CO2 24 23 24 22 25 26   GLUCOSE 122* 82 138* 99 115* 74  BUN 56* 45* 37* 27* 27* 17  CREATININE 1.21* 1.18* 0.91 0.86 0.99 0.87  CALCIUM 7.0* 6.9* 7.1* 6.1* 7.2* 7.3*  MG 1.7  --   --  1.3* 2.1 1.6*  AST 43*  --  35 26 29 31   ALT 26  --  20 17 17 18   ALKPHOS 335*  --  349* 274* 282* 227*  BILITOT 0.5  --  0.3 0.4 0.5 0.6   ------------------------------------------------------------------------------------------------------------------ No results for input(s): CHOL, HDL, LDLCALC, TRIG, CHOLHDL, LDLDIRECT in the last 72 hours.  Lab  Results  Component Value Date   HGBA1C 8.9 (H) 05/24/2019   ------------------------------------------------------------------------------------------------------------------ No results for input(s): TSH, T4TOTAL, T3FREE, THYROIDAB in the last 72 hours.  Invalid input(s): FREET3 ------------------------------------------------------------------------------------------------------------------ No results for input(s): VITAMINB12, FOLATE, FERRITIN, TIBC, IRON, RETICCTPCT in the last 72 hours.  Coagulation profile No results for input(s): INR, PROTIME in the last 168 hours.  No results for input(s): DDIMER in the last 72 hours.  Cardiac Enzymes No results for input(s): CKMB, TROPONINI, MYOGLOBIN in the last 168 hours.  Invalid input(s): CK ------------------------------------------------------------------------------------------------------------------    Component Value Date/Time   BNP 148.7 (H) 07/13/2019 0400    Micro Results No results found for this or any previous visit (from the past 240 hour(s)).  Radiology Reports Ct Head Wo Contrast  Result Date: 07/04/2019 CLINICAL DATA:  Mental status. Fall 2 days ago. Positive COVID-25 May 2019. EXAM: CT HEAD WITHOUT CONTRAST TECHNIQUE: Contiguous axial images were obtained from the base of the skull through the vertex without intravenous contrast. COMPARISON:  None. FINDINGS: Brain: Ventricles, cisterns and other CSF spaces are normal. There is mild chronic ischemic microvascular disease present. There is no mass, mass effect, shift of midline structures or acute hemorrhage. No evidence of acute infarction. Vascular: No hyperdense vessel or unexpected calcification. Skull: Normal. Negative for fracture or focal lesion. Sinuses/Orbits: No acute finding. Other: None. IMPRESSION: No acute findings. Mild chronic ischemic microvascular disease. Electronically Signed   By: Marin Olp M.D.   On: 07/04/2019 07:11   Dg Chest Port 1 View   Result Date: 07/11/2019 CLINICAL DATA:  Evaluate PICC tip. EXAM: PORTABLE CHEST 1 VIEW COMPARISON:  Chest x-ray from same day. FINDINGS: Interval retraction of the right upper extremity PICC line with the tip now in the distal SVC. Stable cardiomediastinal silhouette. Normal pulmonary vascularity. Continued low lung volumes with mild bibasilar atelectasis, slightly improved. No focal consolidation, pleural effusion, or pneumothorax. No acute osseous abnormality. IMPRESSION: 1. Right upper extremity PICC line tip now in the distal SVC. 2. Continued low lung volumes with mildly improved bibasilar atelectasis. Electronically Signed   By: Titus Dubin M.D.   On: 07/11/2019 18:41   Dg Chest Port 1  View  Result Date: 07/11/2019 CLINICAL DATA:  PICC placement EXAM: PORTABLE CHEST 1 VIEW COMPARISON:  07/04/2019 FINDINGS: Right arm PICC is been placed. Tip in the mid right atrium. Recommend withdrawal of 3 cm for optimum position Hypoventilation with bibasilar atelectasis.  No effusion or edema. IMPRESSION: PICC tip in the mid right atrium.  Recommend withdrawal 3 cm Hypoventilation with bibasilar atelectasis. Electronically Signed   By: Franchot Gallo M.D.   On: 07/11/2019 17:30   Dg Chest Port 1 View  Result Date: 07/04/2019 CLINICAL DATA:  Altered mental status and hypotension EXAM: PORTABLE CHEST 1 VIEW COMPARISON:  06/03/2019 FINDINGS: Mild cardiomegaly accentuated by low volumes. Otherwise normal mediastinal contours. There is no edema, consolidation, effusion, or pneumothorax. Postoperative proximal left humerus. IMPRESSION: No evidence of acute disease. Electronically Signed   By: Monte Fantasia M.D.   On: 07/04/2019 05:26   Dg Abd Portable 1v  Result Date: 07/11/2019 CLINICAL DATA:  Diarrhea and C diff EXAM: PORTABLE ABDOMEN - 1 VIEW COMPARISON:  None. FINDINGS: Gas-filled loops of colon within normal limits. No dilated loops of Tyrell bowel. Gas the rectum. IMPRESSION: Gas-filled colon. No evidence  obstruction or inflammation by plain film imaging Electronically Signed   By: Suzy Bouchard M.D.   On: 07/11/2019 11:32   [name] Ekg Site Rite  Result Date: 07/11/2019 If Site Rite image not attached, placement could not be confirmed due to current cardiac rhythm.

## 2019-07-19 NOTE — Progress Notes (Signed)
Physical Therapy Treatment Patient Details Name: Alice Kemp MRN: [id number] DOB: 1951/06/08 Today's Date: 07/19/2019    History of Present Illness 68 y/o female pt w/ hx of anxiety, arthritis, back pain, benign neoplasm of colon, breast abscess, cervical radiculopathy, CKD III, depression, diastolic HF, DM II, glaucoma, gout, HLD, morbid obesity, obstructive sleep apnea, pericardial effusion, pulmonary HTN, hx of COVID in 06/20. pt underwent decompressive laminectomy L3 partial inferior laminectomy L2 and microscopic discectomy from the left and foraminotomies of the left L3 nerve root on 05/30/19. She had a fall on 07/02/2019, admitted with AMS, hypotension; C-diff.     PT Comments    The patient is making steady progress in mobility, sitting and MS is very much improved. contiue Mobility and make an attempt for standing when deemed safe.    Follow Up Recommendations  SNF     Equipment Recommendations  None recommended by PT    Recommendations for Other Services       Precautions / Restrictions Precautions Precautions: Fall Precaution Comments: incontinence, low BP Restrictions Weight Bearing Restrictions: No    Mobility  Bed Mobility Overal bed mobility: Needs Assistance Bed Mobility: Rolling;Supine to Sit;Sit to Supine Rolling: Max assist;+2 for physical assistance   Supine to sit: Max assist;+2 for physical assistance;+2 for safety/equipment Sit to supine: Max assist;+2 for physical assistance;+2 for safety/equipment   General bed mobility comments: rolling to L/R for pericare/changing bed linens, pt with good initiation but continues to require +2 assist to carry out mobility tasks  Transfers                 General transfer comment: deferred, discussed options for attempting to stand during next OT/PT cotx  Ambulation/Gait                 Stairs             Wheelchair Mobility    Modified Rankin (Stroke Patients Only)       Balance  Overall balance assessment: Needs assistance Sitting-balance support: Feet supported Sitting balance-Leahy Scale: Good Sitting balance - Comments: supervision - minguard while seated EOB, placed folded floor mat under pt's feet for LE support due to short stature pt's feet not able to fully reach the floor                                    Cognition Arousal/Alertness: Awake/alert Behavior During Therapy: WFL for tasks assessed/performed Overall Cognitive Status: Impaired/Different from baseline                         Following Commands: Follows one step commands with increased time   Awareness: Anticipatory Problem Solving: Slow processing General Comments: pt with improved mentation from previous sessions, talakative about brothers, mother, 2 dogs,      Exercises General Exercises - Lower Extremity Ankle Circles/Pumps: AROM;Both;10 reps;Seated Long Arc Quad: AROM;Both;10 reps;Seated    General Comments General comments (skin integrity, edema, etc.): VSS      Pertinent Vitals/Pain Pain Assessment: Faces Faces Pain Scale: Hurts little more Pain Location: sacral region Pain Descriptors / Indicators: Discomfort;Grimacing;Sore Pain Intervention(s): Monitored during session;Repositioned    Home Living                      Prior Function            PT Goals (current goals can  now be found in the care plan section) Acute Rehab PT Goals Patient Stated Goal: to get back home to her mom and dogs PT Goal Formulation: With patient Time For Goal Achievement: 08/02/19 Potential to Achieve Goals: Fair Progress towards PT goals: Progressing toward goals(goals updated)    Frequency    Min 2X/week      PT Plan Current plan remains appropriate    Co-evaluation PT/OT/SLP Co-Evaluation/Treatment: Yes Reason for Co-Treatment: Complexity of the patient's impairments (multi-system involvement);For patient/therapist safety PT goals addressed  during session: Mobility/safety with mobility OT goals addressed during session: ADL's and self-care      AM-PAC PT "6 Clicks" Mobility   Outcome Measure  Help needed turning from your back to your side while in a flat bed without using bedrails?: A Lot Help needed moving from lying on your back to sitting on the side of a flat bed without using bedrails?: A Lot   Help needed standing up from a chair using your arms (e.g., wheelchair or bedside chair)?: Total Help needed to walk in hospital room?: Total Help needed climbing 3-5 steps with a railing? : Total 6 Click Score: 7    End of Session   Activity Tolerance: Patient tolerated treatment well Patient left: in bed;with call bell/phone within reach Nurse Communication: Mobility status;Need for lift equipment PT Visit Diagnosis: Other abnormalities of gait and mobility (R26.89);History of falling (Z91.81)     Time: [id number] PT Time Calculation (min) (ACUTE ONLY): 44 min  Charges:  $Therapeutic Activity: 23-37 mins                     Alice Kemp PT Acute Rehabilitation Services Pager [phone] Office [phone]    Alice Kemp 07/19/2019, 5:15 PM

## 2019-07-20 DIAGNOSIS — U071 COVID-19: Secondary | ICD-10-CM | POA: Diagnosis not present

## 2019-07-20 DIAGNOSIS — G9349 Other encephalopathy: Secondary | ICD-10-CM | POA: Diagnosis not present

## 2019-07-20 LAB — CBC WITH DIFFERENTIAL/PLATELET
Abs Immature Granulocytes: 0.05 10*3/uL (ref 0.00–0.07)
Basophils Absolute: 0.1 10*3/uL (ref 0.0–0.1)
Basophils Relative: 1 %
Eosinophils Absolute: 0.2 10*3/uL (ref 0.0–0.5)
Eosinophils Relative: 1 %
HCT: 27.9 % — ABNORMAL LOW (ref 36.0–46.0)
Hemoglobin: 8.4 g/dL — ABNORMAL LOW (ref 12.0–15.0)
Immature Granulocytes: 0 %
Lymphocytes Relative: 27 %
Lymphs Abs: 3.2 10*3/uL (ref 0.7–4.0)
MCH: 27.3 pg (ref 26.0–34.0)
MCHC: 30.1 g/dL (ref 30.0–36.0)
MCV: 90.6 fL (ref 80.0–100.0)
Monocytes Absolute: 1 10*3/uL (ref 0.1–1.0)
Monocytes Relative: 8 %
Neutro Abs: 7.4 10*3/uL (ref 1.7–7.7)
Neutrophils Relative %: 63 %
Platelets: 305 10*3/uL (ref 150–400)
RBC: 3.08 MIL/uL — ABNORMAL LOW (ref 3.87–5.11)
RDW: 19.3 % — ABNORMAL HIGH (ref 11.5–15.5)
WBC: 11.8 10*3/uL — ABNORMAL HIGH (ref 4.0–10.5)
nRBC: 0 % (ref 0.0–0.2)

## 2019-07-20 LAB — COMPREHENSIVE METABOLIC PANEL
ALT: 18 U/L (ref 0–44)
AST: 26 U/L (ref 15–41)
Albumin: 1.3 g/dL — ABNORMAL LOW (ref 3.5–5.0)
Alkaline Phosphatase: 202 U/L — ABNORMAL HIGH (ref 38–126)
Anion gap: 5 (ref 5–15)
BUN: 13 mg/dL (ref 8–23)
CO2: 25 mmol/L (ref 22–32)
Calcium: 7.2 mg/dL — ABNORMAL LOW (ref 8.9–10.3)
Chloride: 108 mmol/L (ref 98–111)
Creatinine, Ser: 0.85 mg/dL (ref 0.44–1.00)
GFR calc Af Amer: >60 mL/min (ref >60)
GFR calc non Af Amer: >60 mL/min (ref >60)
Glucose, Bld: 104 mg/dL — ABNORMAL HIGH (ref 70–99)
Potassium: 4.3 mmol/L (ref 3.5–5.1)
Sodium: 138 mmol/L (ref 135–145)
Total Bilirubin: 0.2 mg/dL — ABNORMAL LOW (ref 0.3–1.2)
Total Protein: 4.6 g/dL — ABNORMAL LOW (ref 6.5–8.1)

## 2019-07-20 LAB — GLUCOSE, CAPILLARY
Glucose-Capillary: 98 mg/dL (ref 70–99)
Glucose-Capillary: 116 mg/dL — ABNORMAL HIGH (ref 70–99)
Glucose-Capillary: 113 mg/dL — ABNORMAL HIGH (ref 70–99)
Glucose-Capillary: 105 mg/dL — ABNORMAL HIGH (ref 70–99)

## 2019-07-20 LAB — MAGNESIUM: Magnesium: 1.7 mg/dL (ref 1.7–2.4)

## 2019-07-20 NOTE — Progress Notes (Signed)
PROGRESS NOTE                                                                                                                                                                                                             Patient Demographics:    Alice Kemp, is a 68 y.o. female, DOB - 24-Sep-1951, [id number]  Outpatient Primary MD for the patient is [name], MD   Admit date - 07/04/2019   LOS - 98  Chief Complaint  Patient presents with  . Fall       Brief Narrative: Patient is a 68 y.o. female with PMHx of hypothyroidism, morbid obesity, dyslipidemia, DM, chronic diastolic heart failure, CKD stage III who was hospitalized in August for spinal stenosis-she underwent a lumbar laminectomy-she was incidentally found to have COVID-19 during that hospitalization.  She presented to the ED for evaluation of fall along with profuse diarrhea-she was subsequently found to have C. difficile colitis along with severe dehydration and AKI.  She again was found to have COVID-19.  She was subsequently admitted to the hospitalist service.   Subjective:   Patient in bed, appears comfortable, denies any headache, no fever, no chest pain or pressure, no shortness of breath , no abdominal pain. No focal weakness.  Diarrhea has improved.  Bilateral lower extremity weakness which is 6 to 8 months in duration is gradually improving per patient.   Assessment  & Plan :   COVID-19 infection: Incidental finding-without any respiratory symptoms-GI symptoms are from C. difficile colitis.  Severe C. difficile colitis: Improved-diarrhea has slowed down significantly-and stools are getting formed as well.  Continue IV Flagyl and p.o. Vancomycin.  PRN Questran, case was discussed by me with GI physician Dr. Lyndel Safe and ID physician Dr. Alton Revere.  Diarrhea seems to be worsening again around 07/18/2019, continue hydration as she continues to get dehydrated,  will add Dificid if after 2 doses of cholestyramine her diarrhea reoccurs on 10/ 15 or 16 /2020.  AKI: Hemodynamically mediated-in the setting of diarrhea-resolved.    Hypotension: Due to volume loss from diarrhea, IV fluid bolus and maintenance ordered again on 07/18/2019 due to persistent hypotension, continue midodrine and monitor.  Hypokalemia/hypomagnesemia/hypocalcemia: Replete potassium and magnesium-corrected calcium is in the low normal range.  Depression: Stable-continue amitriptyline, Wellbutrin and Cymbalta  Hypothyroidism: Continue Synthroid   Chronic diastolic heart  failure: Continues to have lower extremity edema-probably from hypoalbuminemia rather than decompensated heart failure-repeat 1 dose of Lasix today.  Hypertension: Given hypotension-continue to hold all antihypertensives.  Dyslipidemia: Continue with Synthroid  Stage II sacral decubitus ulcer: Continue per wound care.   Recent L-spine spinal stenosis surgery last month on 05/30/2019 by Dr. Saintclair Halsted.  Per patient-while at [facility] after her discharge from the hospital in August-she was slowly improving and was just about able to get up with assistance and ambulate a few steps with assistance.  She is able to lift both of her legs off the bed-and just about able to bend them at the knee.    Note patient informs me that her gradually progressive bilateral lower extremity weakness was present for at least 6 to 8 months prior to her surgery and since her surgery her back pain has improved and her lower extremity weakness has improved a little bit, she was having lot of problems standing up and walking for several months and finally 1 day fell and came to the hospital after which she had the back surgery.   Obesity: BMI of 42 follow with PCP for weight loss.  Debility/deconditioning: Sounds like she has chronic debility at baseline from recent surgery/spondylolisthesis-but it seems like she has worsened after some improvement in  function after her recent stay at SNF.  Suspect worsening is from acute illness associated debility/deconditioning due to severe C. difficile colitis.  DM-2: CBGs stable-continue Lantus 10 units and SSI.  Poor outpatient control due to hyperglycemia.  Lab Results  Component Value Date   HGBA1C 8.9 (H) 05/24/2019   CBG (last 3)  Recent Labs    07/19/19 1204 07/19/19 1648 07/19/19 2112  GLUCAP 128* 142* 121*     Condition - Stable  Family Communication  : Brother and mother were updated by me personally few days into admission.  Code Status :  DNR  Diet :   Diet Order            DIET DYS 3 Room service appropriate? Yes; Fluid consistency: Thin  Diet effective now               Disposition Plan  :  Remain hospitalized-SNF probably 07/23/2019 if all stable  Barriers to discharge: Diarrhea-electrolyte abnormalities  Consults  : Over the phone GI Dr.Gupta and ID Dr. Linus Salmons were consulted.  Procedures  :  None  Antibiotics  :    Anti-infectives (From admission, onward)   Start     Dose/Rate Route Frequency Ordered Stop   07/15/19 1400  metroNIDAZOLE (FLAGYL) IVPB 500 mg     500 mg 100 mL/hr over 60 Minutes Intravenous Every 8 hours 07/15/19 0953     07/13/19 1000  metroNIDAZOLE (FLAGYL) tablet 500 mg  Status:  Discontinued     500 mg Oral Every 8 hours 07/13/19 0952 07/15/19 0953   07/12/19 0400  metroNIDAZOLE (FLAGYL) IVPB 500 mg  Status:  Discontinued     500 mg 100 mL/hr over 60 Minutes Intravenous Every 8 hours 07/11/19 1935 07/13/19 0952   07/11/19 1200  metroNIDAZOLE (FLAGYL) IVPB 500 mg  Status:  Discontinued     500 mg 100 mL/hr over 60 Minutes Intravenous Every 8 hours 07/11/19 1055 07/11/19 1935   07/09/19 2130  vancomycin (VANCOCIN) 50 mg/mL oral solution 500 mg  Status:  Discontinued     500 mg Oral Every 6 hours 07/08/19 2104 07/08/19 2146   07/08/19 2200  vancomycin (VANCOCIN) 50 mg/mL oral solution  500 mg     500 mg Oral Every 6 hours 07/08/19 2146      07/05/19 1600  remdesivir 100 mg in sodium chloride 0.9 % 250 mL IVPB  Status:  Discontinued     100 mg 500 mL/hr over 30 Minutes Intravenous Every 24 hours 07/04/19 1517 07/05/19 0734   07/04/19 1600  remdesivir 200 mg in sodium chloride 0.9 % 250 mL IVPB     200 mg 500 mL/hr over 30 Minutes Intravenous Once 07/04/19 1517 07/04/19 1941   07/04/19 1400  vancomycin (VANCOCIN) 50 mg/mL oral solution 500 mg  Status:  Discontinued     500 mg Oral Every 6 hours 07/04/19 1134 07/08/19 2104      Inpatient Medications  Scheduled Meds: . aspirin  81 mg Oral Daily  . buPROPion  100 mg Oral BID  . Chlorhexidine Gluconate Cloth  6 each Topical Daily  . cholestyramine light  4 g Oral Q24H  . DULoxetine  30 mg Oral BID  . enoxaparin (LOVENOX) injection  55 mg Subcutaneous Q24H  . feeding supplement (ENSURE ENLIVE)  237 mL Oral TID BM  . feeding supplement (PRO-STAT SUGAR FREE 64)  30 mL Oral TID WC  . gabapentin  300 mg Oral TID  . Gerhardt's butt cream  1 application Topical TID  . insulin aspart  0-15 Units Subcutaneous TID WC  . insulin aspart  0-5 Units Subcutaneous QHS  . insulin glargine  10 Units Subcutaneous Daily  . levothyroxine  88 mcg Oral QAC breakfast  . midodrine  10 mg Oral BID WC  . multivitamin with minerals  1 tablet Oral Daily  . rosuvastatin  5 mg Oral Daily  . sodium chloride flush  10-40 mL Intracatheter Q12H  . vancomycin  500 mg Oral Q6H   Continuous Infusions: . metronidazole 500 mg (07/20/19 0558)   PRN Meds:.acetaminophen, magic mouthwash w/lidocaine, [DISCONTINUED] ondansetron **OR** ondansetron (ZOFRAN) IV, promethazine   Time Spent in minutes  25  See all Orders from today for further details   Lala Lund M.D on 07/20/2019 at 10:08 AM  To page go to [email or web address] - use universal password  [facility] -  Office  [phone]    Objective:   Vitals:   07/19/19 2019 07/20/19 0425 07/20/19 0500 07/20/19 0830  BP:  122/62  (!) 148/61   Pulse:  86  97  Resp: 18   16  Temp: (!) 97.5 F (36.4 C) 98 F (36.7 C)  98.5 F (36.9 C)  TempSrc: Oral Oral  Oral  SpO2:  95%  96%  Weight:   125.2 kg   Height:        Wt Readings from Last 3 Encounters:  07/20/19 125.2 kg  06/05/19 117.8 kg  12/14/18 112.5 kg     Intake/Output Summary (Last 24 hours) at 07/20/2019 1008 Last data filed at 07/20/2019 0630 Gross per 24 hour  Intake 3262.07 ml  Output 1275 ml  Net 1987.07 ml     Physical Exam  Awake Alert,  No new F.N deficits, lower extremity strength 4/5 bilateral - chronic, she also has chronic mild panniculitis but there seems to be cracked skin under her pannus from moisture Foley in place [place].AT,PERRAL Supple Neck,No JVD, No cervical lymphadenopathy appriciated.  Symmetrical Chest wall movement, Good air movement bilaterally, CTAB RRR,No Gallops, Rubs or new Murmurs, No Parasternal Heave +ve B.Sounds, Abd Soft, No tenderness, No organomegaly appriciated, No rebound - guarding or rigidity. No Cyanosis, Clubbing or edema,  Data Review:    CBC Recent Labs  Lab 07/15/19 0655 07/16/19 0819 07/17/19 0645 07/19/19 0615 07/20/19 0445  WBC 8.6 10.4 8.2 6.4 11.8*  HGB 8.2* 8.9* 8.5* 8.4* 8.4*  HCT 27.2* 29.8* 28.4* 28.0* 27.9*  PLT 280 312 319 284 305  MCV 91.3 92.0 91.0 92.1 90.6  MCH 27.5 27.5 27.2 27.6 27.3  MCHC 30.1 29.9* 29.9* 30.0 30.1  RDW 17.4* 17.7* 18.1* 18.8* 19.3*  LYMPHSABS  --   --   --  3.0 3.2  MONOABS  --   --   --  0.6 1.0  EOSABS  --   --   --  0.2 0.2  BASOSABS  --   --   --  0.1 0.1    Chemistries  Recent Labs  Lab 07/15/19 0655 07/16/19 0819 07/17/19 0645 07/19/19 0615 07/20/19 0445  NA 138 139 138 138 138  K 3.6 3.2* 4.3 3.8 4.3  CL 105 112* 109 106 108  CO2 24 22 25 26 25   GLUCOSE 138* 99 115* 74 104*  BUN 37* 27* 27* 17 13  CREATININE 0.91 0.86 0.99 0.87 0.85  CALCIUM 7.1* 6.1* 7.2* 7.3* 7.2*  MG  --  1.3* 2.1 1.6* 1.7  AST 35 26 29 31 26   ALT 20 17 17 18 18    ALKPHOS 349* 274* 282* 227* 202*  BILITOT 0.3 0.4 0.5 0.6 0.2*   ------------------------------------------------------------------------------------------------------------------ No results for input(s): CHOL, HDL, LDLCALC, TRIG, CHOLHDL, LDLDIRECT in the last 72 hours.  Lab Results  Component Value Date   HGBA1C 8.9 (H) 05/24/2019   ------------------------------------------------------------------------------------------------------------------ No results for input(s): TSH, T4TOTAL, T3FREE, THYROIDAB in the last 72 hours.  Invalid input(s): FREET3 ------------------------------------------------------------------------------------------------------------------ No results for input(s): VITAMINB12, FOLATE, FERRITIN, TIBC, IRON, RETICCTPCT in the last 72 hours.  Coagulation profile No results for input(s): INR, PROTIME in the last 168 hours.  No results for input(s): DDIMER in the last 72 hours.  Cardiac Enzymes No results for input(s): CKMB, TROPONINI, MYOGLOBIN in the last 168 hours.  Invalid input(s): CK ------------------------------------------------------------------------------------------------------------------    Component Value Date/Time   BNP 148.7 (H) 07/13/2019 0400    Micro Results No results found for this or any previous visit (from the past 240 hour(s)).  Radiology Reports Ct Head Wo Contrast  Result Date: 07/04/2019 CLINICAL DATA:  Mental status. Fall 2 days ago. Positive COVID-25 May 2019. EXAM: CT HEAD WITHOUT CONTRAST TECHNIQUE: Contiguous axial images were obtained from the base of the skull through the vertex without intravenous contrast. COMPARISON:  None. FINDINGS: Brain: Ventricles, cisterns and other CSF spaces are normal. There is mild chronic ischemic microvascular disease present. There is no mass, mass effect, shift of midline structures or acute hemorrhage. No evidence of acute infarction. Vascular: No hyperdense vessel or unexpected  calcification. Skull: Normal. Negative for fracture or focal lesion. Sinuses/Orbits: No acute finding. Other: None. IMPRESSION: No acute findings. Mild chronic ischemic microvascular disease. Electronically Signed   By: Marin Olp M.D.   On: 07/04/2019 07:11   Dg Chest Port 1 View  Result Date: 07/11/2019 CLINICAL DATA:  Evaluate PICC tip. EXAM: PORTABLE CHEST 1 VIEW COMPARISON:  Chest x-ray from same day. FINDINGS: Interval retraction of the right upper extremity PICC line with the tip now in the distal SVC. Stable cardiomediastinal silhouette. Normal pulmonary vascularity. Continued low lung volumes with mild bibasilar atelectasis, slightly improved. No focal consolidation, pleural effusion, or pneumothorax. No acute osseous abnormality. IMPRESSION: 1. Right upper extremity PICC line tip now in  the distal SVC. 2. Continued low lung volumes with mildly improved bibasilar atelectasis. Electronically Signed   By: Titus Dubin M.D.   On: 07/11/2019 18:41   Dg Chest Port 1 View  Result Date: 07/11/2019 CLINICAL DATA:  PICC placement EXAM: PORTABLE CHEST 1 VIEW COMPARISON:  07/04/2019 FINDINGS: Right arm PICC is been placed. Tip in the mid right atrium. Recommend withdrawal of 3 cm for optimum position Hypoventilation with bibasilar atelectasis.  No effusion or edema. IMPRESSION: PICC tip in the mid right atrium.  Recommend withdrawal 3 cm Hypoventilation with bibasilar atelectasis. Electronically Signed   By: Franchot Gallo M.D.   On: 07/11/2019 17:30   Dg Chest Port 1 View  Result Date: 07/04/2019 CLINICAL DATA:  Altered mental status and hypotension EXAM: PORTABLE CHEST 1 VIEW COMPARISON:  06/03/2019 FINDINGS: Mild cardiomegaly accentuated by low volumes. Otherwise normal mediastinal contours. There is no edema, consolidation, effusion, or pneumothorax. Postoperative proximal left humerus. IMPRESSION: No evidence of acute disease. Electronically Signed   By: Monte Fantasia M.D.   On: 07/04/2019  05:26   Dg Abd Portable 1v  Result Date: 07/11/2019 CLINICAL DATA:  Diarrhea and C diff EXAM: PORTABLE ABDOMEN - 1 VIEW COMPARISON:  None. FINDINGS: Gas-filled loops of colon within normal limits. No dilated loops of [place] bowel. Gas the rectum. IMPRESSION: Gas-filled colon. No evidence obstruction or inflammation by plain film imaging Electronically Signed   By: Suzy Bouchard M.D.   On: 07/11/2019 11:32   [name] Ekg Site Rite  Result Date: 07/11/2019 If Site Rite image not attached, placement could not be confirmed due to current cardiac rhythm.

## 2019-07-20 NOTE — Progress Notes (Signed)
Pt threw up entire dose of vancomycin.

## 2019-07-20 NOTE — Progress Notes (Signed)
Updated mother on status.

## 2019-07-21 ENCOUNTER — Inpatient Hospital Stay (HOSPITAL_COMMUNITY): Payer: PPO

## 2019-07-21 DIAGNOSIS — G9349 Other encephalopathy: Secondary | ICD-10-CM | POA: Diagnosis not present

## 2019-07-21 DIAGNOSIS — U071 COVID-19: Secondary | ICD-10-CM | POA: Diagnosis not present

## 2019-07-21 LAB — CBC WITH DIFFERENTIAL/PLATELET
Abs Immature Granulocytes: 0.11 10*3/uL — ABNORMAL HIGH (ref 0.00–0.07)
Basophils Absolute: 0.1 10*3/uL (ref 0.0–0.1)
Basophils Relative: 0 %
Eosinophils Absolute: 0 10*3/uL (ref 0.0–0.5)
Eosinophils Relative: 0 %
HCT: 29.6 % — ABNORMAL LOW (ref 36.0–46.0)
Hemoglobin: 8.9 g/dL — ABNORMAL LOW (ref 12.0–15.0)
Immature Granulocytes: 1 %
Lymphocytes Relative: 24 %
Lymphs Abs: 4.8 10*3/uL — ABNORMAL HIGH (ref 0.7–4.0)
MCH: 27.6 pg (ref 26.0–34.0)
MCHC: 30.1 g/dL (ref 30.0–36.0)
MCV: 91.6 fL (ref 80.0–100.0)
Monocytes Absolute: 1.5 10*3/uL — ABNORMAL HIGH (ref 0.1–1.0)
Monocytes Relative: 7 %
Neutro Abs: 13.5 10*3/uL — ABNORMAL HIGH (ref 1.7–7.7)
Neutrophils Relative %: 68 %
Platelets: 339 10*3/uL (ref 150–400)
RBC: 3.23 MIL/uL — ABNORMAL LOW (ref 3.87–5.11)
RDW: 19.7 % — ABNORMAL HIGH (ref 11.5–15.5)
WBC: 20.1 10*3/uL — ABNORMAL HIGH (ref 4.0–10.5)
nRBC: 0 % (ref 0.0–0.2)

## 2019-07-21 LAB — COMPREHENSIVE METABOLIC PANEL
ALT: 17 U/L (ref 0–44)
AST: 24 U/L (ref 15–41)
Albumin: 1.3 g/dL — ABNORMAL LOW (ref 3.5–5.0)
Alkaline Phosphatase: 188 U/L — ABNORMAL HIGH (ref 38–126)
Anion gap: 7 (ref 5–15)
BUN: 13 mg/dL (ref 8–23)
CO2: 25 mmol/L (ref 22–32)
Calcium: 7.3 mg/dL — ABNORMAL LOW (ref 8.9–10.3)
Chloride: 102 mmol/L (ref 98–111)
Creatinine, Ser: 1.09 mg/dL — ABNORMAL HIGH (ref 0.44–1.00)
GFR calc Af Amer: 60 mL/min — ABNORMAL LOW (ref >60)
GFR calc non Af Amer: 52 mL/min — ABNORMAL LOW (ref >60)
Glucose, Bld: 99 mg/dL (ref 70–99)
Potassium: 4.2 mmol/L (ref 3.5–5.1)
Sodium: 134 mmol/L — ABNORMAL LOW (ref 135–145)
Total Bilirubin: 0.5 mg/dL (ref 0.3–1.2)
Total Protein: 4.8 g/dL — ABNORMAL LOW (ref 6.5–8.1)

## 2019-07-21 LAB — GLUCOSE, CAPILLARY
Glucose-Capillary: 103 mg/dL — ABNORMAL HIGH (ref 70–99)
Glucose-Capillary: 108 mg/dL — ABNORMAL HIGH (ref 70–99)
Glucose-Capillary: 105 mg/dL — ABNORMAL HIGH (ref 70–99)
Glucose-Capillary: 113 mg/dL — ABNORMAL HIGH (ref 70–99)

## 2019-07-21 LAB — MAGNESIUM: Magnesium: 1.7 mg/dL (ref 1.7–2.4)

## 2019-07-21 MED ORDER — LACTATED RINGERS IV SOLN
INTRAVENOUS | Status: AC
  Administered 2019-07-21 (×2): via INTRAVENOUS

## 2019-07-21 MED ORDER — LACTATED RINGERS IV SOLN: INTRAVENOUS | Status: AC

## 2019-07-21 MED ORDER — INSULIN ASPART 100 UNIT/ML ~~LOC~~ SOLN
0.0000 [IU] | Freq: Four times a day (QID) | SUBCUTANEOUS | Status: DC
  Administered 2019-07-22: 2 [IU] via SUBCUTANEOUS
  Administered 2019-07-22: 3 [IU] via SUBCUTANEOUS
  Administered 2019-07-23 – 2019-07-25 (×4): 2 [IU] via SUBCUTANEOUS
  Administered 2019-07-26 (×2): 3 [IU] via SUBCUTANEOUS
  Administered 2019-07-26 – 2019-07-29 (×7): 2 [IU] via SUBCUTANEOUS
  Administered 2019-07-30 (×2): 3 [IU] via SUBCUTANEOUS
  Administered 2019-07-30 (×2): 2 [IU] via SUBCUTANEOUS
  Administered 2019-07-31: 19:00:00 3 [IU] via SUBCUTANEOUS
  Administered 2019-07-31 – 2019-08-02 (×6): 2 [IU] via SUBCUTANEOUS
  Administered 2019-08-05: 3 [IU] via SUBCUTANEOUS

## 2019-07-21 MED ORDER — PHENOL 1.4 % MT LIQD
1.0000 | OROMUCOSAL | Status: DC | PRN
  Filled 2019-07-21: qty 177

## 2019-07-21 MED ORDER — SODIUM CHLORIDE 0.9 % IV SOLN
3.0000 g | Freq: Four times a day (QID) | INTRAVENOUS | Status: AC
  Administered 2019-07-21 – 2019-07-26 (×22): 3 g via INTRAVENOUS
  Filled 2019-07-21 (×4): qty 3
  Filled 2019-07-21: qty 8
  Filled 2019-07-21 (×3): qty 3
  Filled 2019-07-21: qty 8
  Filled 2019-07-21 (×6): qty 3
  Filled 2019-07-21: qty 8
  Filled 2019-07-21: qty 3
  Filled 2019-07-21: qty 8
  Filled 2019-07-21: qty 3
  Filled 2019-07-21: qty 8
  Filled 2019-07-21 (×5): qty 3

## 2019-07-21 NOTE — Progress Notes (Addendum)
Report attempt made to receiving nurse three times. Will retry.  - Report given to Dwight. All questions answered.

## 2019-07-21 NOTE — Progress Notes (Addendum)
Nutrition Follow-up RD working remotely.  DOCUMENTATION CODES:   Morbid obesity  INTERVENTION:    Continue to offer Ensure Enlive po TID, each supplement provides 350 kcal and 20 grams of protein.   Continue Pro-stat 30 ml TID, each supplement provides 100 kcal and 15 gm protein.   Continue multivitamin with minerals daily.   Continue Hormel Shake daily with Breakfast which provides 520 kcals and 22 g of protein and Magic cup BID with lunch and dinner, each supplement provides 290 kcal and 9 grams of protein, automatically on meal trays to optimize nutritional intake.   NUTRITION DIAGNOSIS:   Inadequate oral intake related to acute illness(with poor appetite and difficulty chewing) as evidenced by meal completion < 50%.  Ongoing   GOAL:   Patient will meet greater than or equal to 90% of their needs   Unmet  MONITOR:   PO intake, Supplement acceptance, Labs, Skin  ASSESSMENT:   67 yo female admitted from SNF S/P fall, found to have C. Diff colitis. S/P back surgery in August, when she was incidentally found to have COVID-19. PMH includes hypothyroidism, morbid obesity, OSA, HLD, glaucoma, HF, DM, CKD III.   Patient with ongoing nausea and vomiting. Intake of meals has been poor. She is consuming 25% of most meals. She is tolerating the supplements on her meal trays, but doesn't drink/eat 100% of them. She is being offered Ensure Enlive between meals and Pro-stat with meals. Due to nausea and vomiting, she has not been drinking the supplements very often.   Labs reviewed. Sodium 134 (L) CBG's: 105-103  Medications reviewed and include novolog, lantus, flagyl, MVI, vancomycin.  I/O +21.2 L since admission  NUTRITION - FOCUSED PHYSICAL EXAM:  unable to complete  Diet Order:   Diet Order            DIET DYS 3 Room service appropriate? Yes; Fluid consistency: Thin  Diet effective now              EDUCATION NEEDS:   Not appropriate for education at this  time  Skin:  Skin Assessment: Skin Integrity Issues: Skin Integrity Issues:: Incisions Stage II: coccyx Incisions: lower back Other: MASD to abdomen, perineum  Last BM:  10/15 type 6  Height:   Ht Readings from Last 1 Encounters:  07/04/19 5' 4.02" (1.626 m)    Weight:   Wt Readings from Last 1 Encounters:  07/21/19 128.6 kg  07/14/19 112.1 kg  Ideal Body Weight:  54.5 kg  BMI:  Body mass index is 48.64 kg/m.  Estimated Nutritional Needs:   Kcal:  2000-2200  Protein:  115-130 gm  Fluid:  >/= 1.8 L    Molli Barrows, RD, LDN, [place] Pager [phone] After Hours Pager [phone]

## 2019-07-21 NOTE — Progress Notes (Signed)
Attempted to receive report from [facility] nurse x3. Call dropped x3.

## 2019-07-21 NOTE — Progress Notes (Signed)
OT Cancellation Note  Patient Details Name: Alice Kemp MRN: [id number] DOB: 05/13/1951   Cancelled Treatment:    Reason Eval/Treat Not Completed: Medical issues which prohibited therapy; noted pt to go for CT abdomen d/t pain, fever, possible abscess. Will hold OT treatment as follow up as able.   Lou Cal, OT [facility] Pager [phone] Office [place] 07/21/2019, 10:52 AM

## 2019-07-21 NOTE — Plan of Care (Signed)
Pt continues to have bouts/episodes of nausea/vomiting making it difficult to get meds, especially oral vancomycin administered.  Did administer one dose of Phengergan IVP during shift.  Now pt drowsy but arousable and following commands.   Problem: Activity: Goal: Risk for activity intolerance will decrease Outcome: Not Progressing   Problem: Nutrition: Goal: Adequate nutrition will be maintained Outcome: Not Progressing   Problem: Skin Integrity: Goal: Risk for impaired skin integrity will decrease Outcome: Not Progressing

## 2019-07-21 NOTE — Progress Notes (Signed)
Asked pt if wanted to call her mother and give her an update. Pt stated that she had recently talked to her and didn't feel she needed a call from the nurse at this time since nothing new has occurred.

## 2019-07-21 NOTE — Progress Notes (Signed)
PT Cancellation Note  Patient Details Name: Alice Kemp MRN: [id number] DOB: 01/10/1951   Cancelled Treatment:    Reason Eval/Treat Not Completed: Medical issues which prohibited therapy havin N/V, xray abd.    [name] 07/21/2019, 2:37 PM

## 2019-07-21 NOTE — Progress Notes (Signed)
Pharmacy Antibiotic Note  Alice Kemp is a 68 y.o. female admitted on 07/04/2019 with Acute cholecystitis.  Pharmacy has been consulted for Unasyn dosing.  Pt presented with COVID-19 but her main issue has been c.diff. She has been on PO vanc + IV Flagyl. She is being r/o for acute cholycystitis now. Dr. Candiss Norse discussed with ID and CCS today about management. We will try to use a narrower spectrum abx for now. Unasyn was selected.   She has a childhood PCN allergy of rash. Usually this type of allergy wanes over time (>26yrs). We will proceed with Unasyn and watch for reaction.   Plan: Unasyn 3g IV q6 F/u for any reactions  Height: 5' 4.02" (162.6 cm) Weight: 283 lb 8.2 oz (128.6 kg) IBW/kg (Calculated) : 54.74  Temp (24hrs), Avg:98.7 F (37.1 C), Min:98 F (36.7 C), Max:99.2 F (37.3 C)  Recent Labs  Lab 07/16/19 0819 07/17/19 0645 07/19/19 0615 07/20/19 0445 07/21/19 0355  WBC 10.4 8.2 6.4 11.8* 20.1*  CREATININE 0.86 0.99 0.87 0.85 1.09*    Estimated Creatinine Clearance: 64.8 mL/min (A) (by C-G formula based on SCr of 1.09 mg/dL (H)).    Allergies  Allergen Reactions  . Morphine And Related Other (See Comments)    Behavioral Issues  . Sulfa Antibiotics   . Penicillins Rash    Occurred as infant    Antimicrobials this admission: 10/5 Flagyl>> 9/28 PO vanc>> 10/15 Unasyn>>  Onnie Boer, PharmD, [place], AAHIVP, CPP Infectious Disease Pharmacist 07/21/2019 3:01 PM

## 2019-07-21 NOTE — Progress Notes (Signed)
Bedside RN called family for update, no answer and voicemail not set up. Pt unable to tolerate anything by mouth. Pt also refusing meds due to unresolved vomiting. XR and CT complete. Currently unable to complete US at the moment per radiology. MD aware. Awaiting transfer to [place]. Pt currently resting in room, aroused to voice, responds appropriately, breathing normal. Will continue to monitor.

## 2019-07-21 NOTE — Progress Notes (Addendum)
Pt arrived to room 6N32 via [facility]. Received report from [place], Therapist, sports. Pt denies pain at this time. Will continue to monitor.

## 2019-07-21 NOTE — Progress Notes (Addendum)
PROGRESS NOTE                                                                                                                                                                                                             Patient Demographics:    Alice Kemp, is a 68 y.o. female, DOB - Feb 05, 1951, [id number]  Outpatient Primary MD for the patient is [name], MD   Admit date - 07/04/2019   LOS - 27  Chief Complaint  Patient presents with  . Fall       Brief Narrative: Patient is a 68 y.o. female with PMHx of hypothyroidism, morbid obesity, dyslipidemia, DM, chronic diastolic heart failure, CKD stage III who was hospitalized in August for spinal stenosis-she underwent a lumbar laminectomy-she was incidentally found to have COVID-19 during that hospitalization.  She presented to the ED for evaluation of fall along with profuse diarrhea-she was subsequently found to have C. difficile colitis along with severe dehydration and AKI.  She again was found to have COVID-19.  She was subsequently admitted to the hospitalist service.  After prolonged treatment with oral vancomycin and IV Flagyl her C. difficile improved but unfortunately on 07/21/2019 she started to develop persistent nausea and leukocytosis after which a CT scan was done revealing possible acute cholecystitis.  She will now be transferred to [facility] for further care.  General surgery and ID have been consulted.   Subjective:   Patient in bed, appears comfortable, denies any headache, no fever, no chest pain or pressure, no shortness of breath , no abdominal pain but she is nauseated today. No focal weakness. Bilateral lower extremity weakness which is 6 to 8 months in duration is gradually improving per patient.   Assessment  & Plan :   COVID-19 infection: Incidental finding-without any respiratory symptoms-GI symptoms are from C. difficile  colitis.   Note patient's first COVID positive test is 05/24/2019 and she is clearly over 3 weeks beyond her first diagnosis hence does not need any airborne isolation.  Moreover she had no pulmonary symptoms whatsoever this admission.  With clean chest x-ray and no evidence of pneumonia.  Severe C. difficile colitis: Improved-diarrhea has slowed down significantly-and stools are getting formed as well.  Continue IV Flagyl and p.o. Vancomycin.  PRN Questran, case was discussed by me with GI physician Dr. Lyndel Safe and ID physician Dr. Alton Revere.  Finally diarrhea seems to have improved but she has now developed nausea and leukocytosis.  No abdominal pain.  Abdominal x-ray inconclusive will get CT scan.    ?  Acute cholecystitis.  CT scan was done due to developing nausea vomiting and leukocytosis which reveals possible cholecystitis, n.p.o., discussed with general surgery and ID.  Transferred to [facility], 1 dose of Unasyn, continue oral vancomycin and Flagyl, she is currently n.p.o. except medications and I have also ordered right upper quadrant ultrasound.  Continue IV fluids for hydration.  Her best course of action would be a surgical intervention and short course of IV antibiotic if possible.  Any prolonged course of antibiotic will make her C. difficile out of control in my opinion.  At any rate her condition is very tenuous.   AKI: Hemodynamically mediated-in the setting of diarrhea-resolved.    Hypotension: Due to volume loss from diarrhea, IV fluid bolus and maintenance ordered again on 07/18/2019 due to persistent hypotension, continue midodrine and monitor.  Hypokalemia/hypomagnesemia/hypocalcemia: Replete potassium and magnesium-corrected calcium is in the low normal range.  Depression: Stable-continue amitriptyline, Wellbutrin and Cymbalta  Hypothyroidism: Continue Synthroid   Chronic diastolic heart failure: Continues to have lower extremity edema-probably from hypoalbuminemia  rather than decompensated heart failure-repeat 1 dose of Lasix today.  Hypertension: Given hypotension-continue to hold all antihypertensives.  Dyslipidemia: Continue with Synthroid  Stage II sacral decubitus ulcer: Continue per wound care.   Recent L-spine spinal stenosis surgery last month on 05/30/2019 by Dr. Saintclair Halsted.  Per patient-while at [facility] after her discharge from the hospital in August-she was slowly improving and was just about able to get up with assistance and ambulate a few steps with assistance.  She is able to lift both of her legs off the bed-and just about able to bend them at the knee.    Note patient informs me that her gradually progressive bilateral lower extremity weakness was present for at least 6 to 8 months prior to her surgery and since her surgery her back pain has improved and her lower extremity weakness has improved a little bit, she was having lot of problems standing up and walking for several months and finally 1 day fell and came to the hospital after which she had the back surgery.   Obesity: BMI of 42 follow with PCP for weight loss.  Debility/deconditioning: Sounds like she has chronic debility at baseline from recent surgery/spondylolisthesis-but it seems like she has worsened after some improvement in function after her recent stay at SNF.  Suspect worsening is from acute illness associated debility/deconditioning due to severe C. difficile colitis.  DM-2: CBGs stable-continue Lantus 10 units and SSI.  Poor outpatient control due to hyperglycemia.  Lab Results  Component Value Date   HGBA1C 8.9 (H) 05/24/2019   CBG (last 3)  Recent Labs    07/20/19 2043 07/21/19 0820 07/21/19 1125  GLUCAP 105* 103* 108*     Condition - Stable  Family Communication  : Brother and mother were updated by me personally few days into admission.  Code Status :  DNR  Diet :   Diet Order            DIET DYS 3 Room service appropriate? Yes; Fluid consistency: Thin  Diet effective now               Disposition Plan  : Transfer to [facility].  Barriers to discharge: Diarrhea-electrolyte abnormalities  Consults  : Over the phone GI Dr.Gupta and ID Dr. Linus Salmons were consulted last week.    ID Dr. Drucilla Schmidt and general surgery were consulted on 07/21/2019.  Procedures  :  None  Antibiotics  :    Anti-infectives (From admission, onward)   Start     Dose/Rate Route Frequency Ordered Stop   07/15/19 1400  metroNIDAZOLE (FLAGYL) IVPB 500 mg     500 mg 100 mL/hr over 60 Minutes Intravenous Every 8 hours 07/15/19 0953     07/13/19 1000  metroNIDAZOLE (FLAGYL) tablet 500 mg  Status:  Discontinued     500 mg Oral Every 8 hours 07/13/19 0952 07/15/19 0953   07/12/19 0400  metroNIDAZOLE (FLAGYL) IVPB 500 mg  Status:  Discontinued     500 mg 100 mL/hr over 60 Minutes Intravenous Every 8 hours 07/11/19 1935 07/13/19 0952   07/11/19 1200  metroNIDAZOLE (FLAGYL) IVPB 500 mg  Status:  Discontinued     500 mg 100 mL/hr over 60 Minutes Intravenous Every 8 hours 07/11/19 1055 07/11/19 1935   07/09/19 2130  vancomycin (VANCOCIN) 50 mg/mL oral solution 500 mg  Status:  Discontinued     500 mg Oral Every 6 hours 07/08/19 2104 07/08/19 2146   07/08/19 2200  vancomycin (VANCOCIN) 50 mg/mL oral solution 500 mg     500 mg Oral Every 6 hours 07/08/19 2146     07/05/19 1600  remdesivir 100 mg in sodium chloride 0.9 % 250 mL IVPB  Status:  Discontinued     100 mg 500 mL/hr over 30 Minutes Intravenous Every 24 hours 07/04/19 1517 07/05/19 0734   07/04/19 1600  remdesivir 200 mg in sodium chloride 0.9 % 250 mL IVPB     200 mg 500 mL/hr over 30 Minutes Intravenous Once 07/04/19 1517 07/04/19 1941   07/04/19 1400  vancomycin (VANCOCIN) 50 mg/mL oral solution 500 mg  Status:  Discontinued     500 mg Oral Every 6 hours 07/04/19 1134 07/08/19 2104      Inpatient Medications  Scheduled Meds: . aspirin  81 mg Oral Daily  . buPROPion  100 mg Oral BID  .  Chlorhexidine Gluconate Cloth  6 each Topical Daily  . DULoxetine  30 mg Oral BID  . enoxaparin (LOVENOX) injection  55 mg Subcutaneous Q24H  . feeding supplement (ENSURE ENLIVE)  237 mL Oral TID BM  . feeding supplement (PRO-STAT SUGAR FREE 64)  30 mL Oral TID WC  . gabapentin  300 mg Oral TID  . Gerhardt's butt cream  1 application Topical TID  . insulin aspart  0-15 Units Subcutaneous TID WC  . insulin aspart  0-5 Units Subcutaneous QHS  . insulin glargine  10 Units Subcutaneous Daily  . levothyroxine  88 mcg Oral QAC breakfast  . midodrine  10 mg Oral BID WC  . multivitamin with minerals  1 tablet Oral Daily  . rosuvastatin  5 mg Oral Daily  . sodium chloride flush  10-40 mL Intracatheter Q12H  . vancomycin  500 mg Oral Q6H   Continuous Infusions: . metronidazole 500 mg (07/21/19 0535)   PRN Meds:.acetaminophen, magic mouthwash w/lidocaine, [DISCONTINUED] ondansetron **OR** ondansetron (ZOFRAN) IV, promethazine   Time Spent in minutes  25  See all Orders from today for further details   Lala Lund  M.D on 07/21/2019 at 11:47 AM  To page go to [email or web address] - use universal password  [facility] -  Office  [phone]    Objective:   Vitals:   07/21/19 0500 07/21/19 0539 07/21/19 0543 07/21/19 0836  BP:  (!) 104/58  (!) 130/56  Pulse:  97 97 91  Resp:  18  20  Temp:  98.3 F (36.8 C) 99.1 F (37.3 C) 98 F (36.7 C)  TempSrc:  Oral Axillary Oral  SpO2:  97% 96% 94%  Weight: 128.6 kg     Height:        Wt Readings from Last 3 Encounters:  07/21/19 128.6 kg  06/05/19 117.8 kg  12/14/18 112.5 kg     Intake/Output Summary (Last 24 hours) at 07/21/2019 1147 Last data filed at 07/21/2019 0525 Gross per 24 hour  Intake 254.86 ml  Output 275 ml  Net -20.14 ml     Physical Exam  Awake Alert,  No new F.N deficits, lower extremity strength 4/5 bilateral - chronic, she also has chronic mild panniculitis but there seems to be cracked skin under her  pannus from moisture Foley in place [place].AT,PERRAL Supple Neck,No JVD, No cervical lymphadenopathy appriciated.  Symmetrical Chest wall movement, Good air movement bilaterally, CTAB RRR,No Gallops, Rubs or new Murmurs, No Parasternal Heave +ve B.Sounds, Abd Soft, No tenderness, No organomegaly appriciated, No rebound - guarding or rigidity. No Cyanosis, Clubbing or edema, No new Rash or bruise      Data Review:    CBC Recent Labs  Lab 07/16/19 0819 07/17/19 0645 07/19/19 0615 07/20/19 0445 07/21/19 0355  WBC 10.4 8.2 6.4 11.8* 20.1*  HGB 8.9* 8.5* 8.4* 8.4* 8.9*  HCT 29.8* 28.4* 28.0* 27.9* 29.6*  PLT 312 319 284 305 339  MCV 92.0 91.0 92.1 90.6 91.6  MCH 27.5 27.2 27.6 27.3 27.6  MCHC 29.9* 29.9* 30.0 30.1 30.1  RDW 17.7* 18.1* 18.8* 19.3* 19.7*  LYMPHSABS  --   --  3.0 3.2 4.8*  MONOABS  --   --  0.6 1.0 1.5*  EOSABS  --   --  0.2 0.2 0.0  BASOSABS  --   --  0.1 0.1 0.1    Chemistries  Recent Labs  Lab 07/16/19 0819 07/17/19 0645 07/19/19 0615 07/20/19 0445 07/21/19 0355  NA 139 138 138 138 134*  K 3.2* 4.3 3.8 4.3 4.2  CL 112* 109 106 108 102  CO2 22 25 26 25 25   GLUCOSE 99 115* 74 104* 99  BUN 27* 27* 17 13 13   CREATININE 0.86 0.99 0.87 0.85 1.09*  CALCIUM 6.1* 7.2* 7.3* 7.2* 7.3*  MG 1.3* 2.1 1.6* 1.7 1.7  AST 26 29 31 26 24   ALT 17 17 18 18 17   ALKPHOS 274* 282* 227* 202* 188*  BILITOT 0.4 0.5 0.6 0.2* 0.5   ------------------------------------------------------------------------------------------------------------------ No results for input(s): CHOL, HDL, LDLCALC, TRIG, CHOLHDL, LDLDIRECT in the last 72 hours.  Lab Results  Component Value Date   HGBA1C 8.9 (H) 05/24/2019   ------------------------------------------------------------------------------------------------------------------ No results for input(s): TSH, T4TOTAL, T3FREE, THYROIDAB in the last 72 hours.  Invalid input(s): FREET3  ------------------------------------------------------------------------------------------------------------------ No results for input(s): VITAMINB12, FOLATE, FERRITIN, TIBC, IRON, RETICCTPCT in the last 72 hours.  Coagulation profile No results for input(s): INR, PROTIME in the last 168 hours.  No results for input(s): DDIMER in the last 72 hours.  Cardiac Enzymes No results for input(s): CKMB, TROPONINI, MYOGLOBIN in the last 168 hours.  Invalid input(s): CK ------------------------------------------------------------------------------------------------------------------  Component Value Date/Time   BNP 148.7 (H) 07/13/2019 0400    Micro Results No results found for this or any previous visit (from the past 240 hour(s)).  Radiology Reports Ct Head Wo Contrast  Result Date: 07/04/2019 CLINICAL DATA:  Mental status. Fall 2 days ago. Positive COVID-25 May 2019. EXAM: CT HEAD WITHOUT CONTRAST TECHNIQUE: Contiguous axial images were obtained from the base of the skull through the vertex without intravenous contrast. COMPARISON:  None. FINDINGS: Brain: Ventricles, cisterns and other CSF spaces are normal. There is mild chronic ischemic microvascular disease present. There is no mass, mass effect, shift of midline structures or acute hemorrhage. No evidence of acute infarction. Vascular: No hyperdense vessel or unexpected calcification. Skull: Normal. Negative for fracture or focal lesion. Sinuses/Orbits: No acute finding. Other: None. IMPRESSION: No acute findings. Mild chronic ischemic microvascular disease. Electronically Signed   By: Marin Olp M.D.   On: 07/04/2019 07:11   Dg Chest Port 1 View  Result Date: 07/21/2019 CLINICAL DATA:  Covid positive.  Short of breath EXAM: PORTABLE CHEST 1 VIEW COMPARISON:  Radiograph 07/11/2019 FINDINGS: PICC line unchanged. Normal cardiac silhouette. No effusion, infiltrate or pneumothorax. Mild LEFT basilar atelectasis. IMPRESSION: 1. No  significant change. 2. No evidence pneumonia. 3. Mild LEFT basilar atelectasis. Electronically Signed   By: Suzy Bouchard M.D.   On: 07/21/2019 10:39   Dg Chest Port 1 View  Result Date: 07/11/2019 CLINICAL DATA:  Evaluate PICC tip. EXAM: PORTABLE CHEST 1 VIEW COMPARISON:  Chest x-ray from same day. FINDINGS: Interval retraction of the right upper extremity PICC line with the tip now in the distal SVC. Stable cardiomediastinal silhouette. Normal pulmonary vascularity. Continued low lung volumes with mild bibasilar atelectasis, slightly improved. No focal consolidation, pleural effusion, or pneumothorax. No acute osseous abnormality. IMPRESSION: 1. Right upper extremity PICC line tip now in the distal SVC. 2. Continued low lung volumes with mildly improved bibasilar atelectasis. Electronically Signed   By: Titus Dubin M.D.   On: 07/11/2019 18:41   Dg Chest Port 1 View  Result Date: 07/11/2019 CLINICAL DATA:  PICC placement EXAM: PORTABLE CHEST 1 VIEW COMPARISON:  07/04/2019 FINDINGS: Right arm PICC is been placed. Tip in the mid right atrium. Recommend withdrawal of 3 cm for optimum position Hypoventilation with bibasilar atelectasis.  No effusion or edema. IMPRESSION: PICC tip in the mid right atrium.  Recommend withdrawal 3 cm Hypoventilation with bibasilar atelectasis. Electronically Signed   By: Franchot Gallo M.D.   On: 07/11/2019 17:30   Dg Chest Port 1 View  Result Date: 07/04/2019 CLINICAL DATA:  Altered mental status and hypotension EXAM: PORTABLE CHEST 1 VIEW COMPARISON:  06/03/2019 FINDINGS: Mild cardiomegaly accentuated by low volumes. Otherwise normal mediastinal contours. There is no edema, consolidation, effusion, or pneumothorax. Postoperative proximal left humerus. IMPRESSION: No evidence of acute disease. Electronically Signed   By: Monte Fantasia M.D.   On: 07/04/2019 05:26   Dg Abd Portable 1v  Result Date: 07/21/2019 CLINICAL DATA:  COVID positive pt. Pt c/o abdominal  pain EXAM: PORTABLE ABDOMEN - 1 VIEW COMPARISON:  07/11/2019 FINDINGS: Bowel gas pattern is nonobstructed. There is heterogeneous lucency projecting LATERAL to the LEFT chest wall. This could be related to stomach and significant abdominal wall laxity. Is difficult to exclude abscess or loculated extraluminal gas. Consider CT of the abdomen and pelvis as needed. Multiple remote rib fractures. IMPRESSION: 1. Abnormal lucency to the LEFT chest wall consistent with stomach or loculated extraluminal air. Consider CT of the abdomen  and pelvis. 2. Multiple remote rib fractures. These results were called by telephone at the time of interpretation on 07/21/2019 at 10:12 am to provider [facility] , who verbally acknowledged these results. Electronically Signed   By: Nolon Nations M.D.   On: 07/21/2019 10:43   Dg Abd Portable 1v  Result Date: 07/11/2019 CLINICAL DATA:  Diarrhea and C diff EXAM: PORTABLE ABDOMEN - 1 VIEW COMPARISON:  None. FINDINGS: Gas-filled loops of colon within normal limits. No dilated loops of [place] bowel. Gas the rectum. IMPRESSION: Gas-filled colon. No evidence obstruction or inflammation by plain film imaging Electronically Signed   By: Suzy Bouchard M.D.   On: 07/11/2019 11:32   [name] Ekg Site Rite  Result Date: 07/11/2019 If Site Rite image not attached, placement could not be confirmed due to current cardiac rhythm.

## 2019-07-22 ENCOUNTER — Inpatient Hospital Stay (HOSPITAL_COMMUNITY): Payer: PPO

## 2019-07-22 DIAGNOSIS — U071 COVID-19: Secondary | ICD-10-CM | POA: Diagnosis not present

## 2019-07-22 DIAGNOSIS — K8010 Calculus of gallbladder with chronic cholecystitis without obstruction: Secondary | ICD-10-CM | POA: Diagnosis not present

## 2019-07-22 DIAGNOSIS — I130 Hypertensive heart and chronic kidney disease with heart failure and stage 1 through stage 4 chronic kidney disease, or unspecified chronic kidney disease: Secondary | ICD-10-CM

## 2019-07-22 DIAGNOSIS — R1030 Lower abdominal pain, unspecified: Secondary | ICD-10-CM

## 2019-07-22 DIAGNOSIS — R601 Generalized edema: Secondary | ICD-10-CM

## 2019-07-22 DIAGNOSIS — A0472 Enterocolitis due to Clostridium difficile, not specified as recurrent: Secondary | ICD-10-CM | POA: Diagnosis not present

## 2019-07-22 DIAGNOSIS — E785 Hyperlipidemia, unspecified: Secondary | ICD-10-CM

## 2019-07-22 DIAGNOSIS — L89899 Pressure ulcer of other site, unspecified stage: Secondary | ICD-10-CM

## 2019-07-22 DIAGNOSIS — Z885 Allergy status to narcotic agent status: Secondary | ICD-10-CM

## 2019-07-22 DIAGNOSIS — G9349 Other encephalopathy: Secondary | ICD-10-CM | POA: Diagnosis not present

## 2019-07-22 DIAGNOSIS — E1122 Type 2 diabetes mellitus with diabetic chronic kidney disease: Secondary | ICD-10-CM

## 2019-07-22 DIAGNOSIS — K819 Cholecystitis, unspecified: Secondary | ICD-10-CM

## 2019-07-22 DIAGNOSIS — E039 Hypothyroidism, unspecified: Secondary | ICD-10-CM

## 2019-07-22 DIAGNOSIS — I5032 Chronic diastolic (congestive) heart failure: Secondary | ICD-10-CM

## 2019-07-22 DIAGNOSIS — Z881 Allergy status to other antibiotic agents status: Secondary | ICD-10-CM

## 2019-07-22 DIAGNOSIS — E669 Obesity, unspecified: Secondary | ICD-10-CM

## 2019-07-22 DIAGNOSIS — D72829 Elevated white blood cell count, unspecified: Secondary | ICD-10-CM

## 2019-07-22 LAB — GLUCOSE, CAPILLARY
Glucose-Capillary: 124 mg/dL — ABNORMAL HIGH (ref 70–99)
Glucose-Capillary: 148 mg/dL — ABNORMAL HIGH (ref 70–99)
Glucose-Capillary: 160 mg/dL — ABNORMAL HIGH (ref 70–99)
Glucose-Capillary: 150 mg/dL — ABNORMAL HIGH (ref 70–99)
Glucose-Capillary: 159 mg/dL — ABNORMAL HIGH (ref 70–99)
Glucose-Capillary: 127 mg/dL — ABNORMAL HIGH (ref 70–99)

## 2019-07-22 MED ORDER — ENOXAPARIN SODIUM 60 MG/0.6ML ~~LOC~~ SOLN
60.0000 mg | SUBCUTANEOUS | Status: DC
  Administered 2019-07-24 – 2019-08-01 (×9): 60 mg via SUBCUTANEOUS
  Filled 2019-07-22 (×9): qty 0.6

## 2019-07-22 MED ORDER — SUCRALFATE 1 GM/10ML PO SUSP
1.0000 g | Freq: Three times a day (TID) | ORAL | Status: DC
  Administered 2019-07-22 – 2019-07-31 (×31): 1 g via ORAL
  Filled 2019-07-22 (×33): qty 10

## 2019-07-22 MED ORDER — SODIUM CHLORIDE 0.9 % IV SOLN
INTRAVENOUS | Status: DC
  Administered 2019-07-22 – 2019-07-24 (×4): via INTRAVENOUS

## 2019-07-22 MED ORDER — FIDAXOMICIN 200 MG PO TABS
200.0000 mg | ORAL_TABLET | Freq: Two times a day (BID) | ORAL | Status: DC
  Administered 2019-07-23 – 2019-08-01 (×19): 200 mg via ORAL
  Filled 2019-07-22 (×28): qty 1

## 2019-07-22 NOTE — Progress Notes (Signed)
PT Cancellation Note  Patient Details Name: Alice Kemp MRN: [id number] DOB: 11/01/1950   Cancelled Treatment:    Reason Eval/Treat Not Completed: Patient at procedure or test/unavailable(Pt in Nuclear med.)   Denice Paradise 07/22/2019, 2:09 PM Sharmeka Palmisano,PT Acute Rehabilitation Services Pager:  [phone]  Office:  [phone]

## 2019-07-22 NOTE — TOC Progression Note (Addendum)
Transition of Care [facility]) - Progression Note    Patient Details  Name: ARROW HOUGE MRN: [id number] Date of Birth: 1951/05/21  Transition of Care [facility]) CM/SW [place], [place] Phone Number: 07/22/2019, 12:52 PM  Clinical Narrative:    Per verbal/soft hand off from [facility] staff pt authorization has expired for SNF placement at this time due to pt not being medically stable for dc. Spoke with surgical PA and pt likely not dc tomorrow. Will request weekend staff f/u with attending MD for medical stability to reauthorize pt for SNF at that time.  PASRR documents also had not been submitted prior to transfer to floor; will complete and send to [facility].   Plan for pt to dc to [facility]; await further medical update to reinstate insurance approval.    Expected Discharge Plan: [place] Barriers to Discharge: Continued Medical Work up, Ship broker  Expected Discharge Plan and Services Expected Discharge Plan: [place] In-house Referral: Clinical Social Work Discharge Planning Services: NA Post Acute Care Choice: [place] arrangements for the past 2 months: [place] Expected Discharge Date: 07/07/19               DME Arranged: N/A DME Agency: NA  HH Arranged: NA HH Agency: NA   Social Determinants of Health (SDOH) Interventions    Readmission Risk Interventions Readmission Risk Prevention Plan 07/05/2019  Transportation Screening Complete  PCP or Specialist Appt within 3-5 Days Complete  HRI or [place] Complete  Social Work Consult for [place] Planning/Counseling Complete  Palliative Care Screening Not Applicable  Medication Review Press photographer) Complete  Some recent data might be hidden

## 2019-07-22 NOTE — Progress Notes (Addendum)
Paged Dr. Kyung Bacca to notify her of HIDa scan results.  36 Paged MD to be sure she saw results of HIDA scan. She requested I page the surgery team. Dr. Dema Severin notified.

## 2019-07-22 NOTE — Consult Note (Signed)
Reason for Consult: Possible cholecystitis; nausea, vomiting, leukocytosis Referring Physician: Dr. Loma Sousa is an 68 y.o. female.  HPI: 68 year old severely obese female with past medical history of hypothyroidism, dyslipidemia, diabetes mellitus type 2 chronic diastolic heart failure, kidney disease, he was found to have incidental COVID-19 infection without respiratory components presented to the emergency room with diarrhea and was found to have acute kidney injury and C. difficile colitis.  She tested positive again for Covid.  She was admitted to [facility] and started on oral vancomycin and IV Flagyl for her C. difficile colitis.  She was improving slowly but on October 15 she developed persistent nausea and vomiting and developed a leukocytosis.  CT scan was performed which was concerning for possible acute cholecystitis since there is a distended gallbladder and some perihepatic ascites.  She underwent a right upper quadrant ultrasound which demonstrated stones and sludge without any evidence of wall thickening.  She was also found to have fatty liver.  Her LFTs have been normal except for an elevated alkaline phosphatase level which was in the high 200s which is now decreasing to 188.  Her white blood cell count was normal earlier in the week at 6.4 and is now 20,000.  She was transferred to [facility] for general surgery evaluation.  States that she has not been able to take her vancomycin the past 2 days because she cannot keep anything on her stomach.  She states that her esophagus feels raw  Albumin is 1.3  He has had progressive deconditioning.  Past Medical History:  Diagnosis Date   Anxiety    Arthritis    Back pain    Benign neoplasm of colon 06/29/2012   Breast abscess 07/2011   left; s/p I&D + abx by gen surg   Cervical radiculopathy at C6    CKD (chronic kidney disease) stage 3, GFR 30-59 ml/min ([place])    follows with renal   Constipation     Depression    Diabetes mellitus type II    Diastolic heart failure ([place]) 09/2013 echo   Gall bladder inflammation    Glaucoma    Gout    Hyperlipidemia    Hypothyroidism    Morbid obesity ([place])    OBSTRUCTIVE SLEEP APNEA 12/23/2010   npsg 2012:  AHI 25/hr, severe desat to 68%.   autoset 2013: optimal pressure 13cm    PERICARDIAL EFFUSION 12/12/2010   Pulmonary hypertension ([place])    Vitamin B12 deficiency    Vitamin D deficiency     Past Surgical History:  Procedure Laterality Date   BREAST BIOPSY  1990   Left   CATARACT EXTRACTION     COLONOSCOPY  06/29/2012   Procedure: COLONOSCOPY;  Surgeon: Ladene Artist, MD,FACG;  Location: [facility];  Service: Endoscopy;  Laterality: N/A;   LUMBAR LAMINECTOMY/DECOMPRESSION MICRODISCECTOMY Left 05/30/2019   Procedure: Left Decompressive Lumbar Laminectomy and Microdiscectomy lumbar three-four;  Surgeon: Kary Kos, MD;  Location: [place];  Service: Neurosurgery;  Laterality: Left;   SHOULDER SURGERY  2010   Left   TONSILLECTOMY  1964    Family History  Problem Relation Age of Onset   Hypertension Mother    Heart disease Mother    Heart attack Mother    Thyroid disease Mother    Diabetes Father    Heart disease Father    Skin cancer Father    Cancer Father        skin   Heart attack Father  Hyperlipidemia Father    Colon polyps Father    Depression Father    Hypertension Brother    Skin cancer Paternal Grandfather    Colon cancer Paternal Grandfather 20   Breast cancer Paternal Aunt    Cancer Paternal Aunt        breast   Diabetes Paternal Uncle    Diabetes Maternal Grandfather    Sudden death Neg Hx     Social History:  reports that she has never smoked. She has never used smokeless tobacco. She reports that she does not drink alcohol or use drugs.  Allergies:  Allergies  Allergen Reactions   Morphine And Related Other (See Comments)    Behavioral Issues   Sulfa Antibiotics      Penicillins Rash    Occurred as infant    Medications: I have reviewed the patient's current medications.  Results for orders placed or performed during the hospital encounter of 07/04/19 (from the past 48 hour(s))  Comprehensive metabolic panel     Status: Abnormal   Collection Time: 07/20/19  4:45 AM  Result Value Ref Range   Sodium 138 135 - 145 mmol/L   Potassium 4.3 3.5 - 5.1 mmol/L   Chloride 108 98 - 111 mmol/L   CO2 25 22 - 32 mmol/L   Glucose, Bld 104 (H) 70 - 99 mg/dL   BUN 13 8 - 23 mg/dL   Creatinine, Ser 0.85 0.44 - 1.00 mg/dL   Calcium 7.2 (L) 8.9 - 10.3 mg/dL   Total Protein 4.6 (L) 6.5 - 8.1 g/dL   Albumin 1.3 (L) 3.5 - 5.0 g/dL   AST 26 15 - 41 U/L   ALT 18 0 - 44 U/L   Alkaline Phosphatase 202 (H) 38 - 126 U/L   Total Bilirubin 0.2 (L) 0.3 - 1.2 mg/dL   GFR calc non Af Amer >60 >60 mL/min   GFR calc Af Amer >60 >60 mL/min   Anion gap 5 5 - 15    Comment: Performed at [facility], [place] [address] [place], [place] [address]  CBC with Differential/Platelet     Status: Abnormal   Collection Time: 07/20/19  4:45 AM  Result Value Ref Range   WBC 11.8 (H) 4.0 - 10.5 K/uL   RBC 3.08 (L) 3.87 - 5.11 MIL/uL   Hemoglobin 8.4 (L) 12.0 - 15.0 g/dL   HCT 27.9 (L) 36.0 - 46.0 %   MCV 90.6 80.0 - 100.0 fL   MCH 27.3 26.0 - 34.0 pg   MCHC 30.1 30.0 - 36.0 g/dL   RDW 19.3 (H) 11.5 - 15.5 %   Platelets 305 150 - 400 K/uL   nRBC 0.0 0.0 - 0.2 %   Neutrophils Relative % 63 %   Neutro Abs 7.4 1.7 - 7.7 K/uL   Lymphocytes Relative 27 %   Lymphs Abs 3.2 0.7 - 4.0 K/uL   Monocytes Relative 8 %   Monocytes Absolute 1.0 0.1 - 1.0 K/uL   Eosinophils Relative 1 %   Eosinophils Absolute 0.2 0.0 - 0.5 K/uL   Basophils Relative 1 %   Basophils Absolute 0.1 0.0 - 0.1 K/uL   Immature Granulocytes 0 %   Abs Immature Granulocytes 0.05 0.00 - 0.07 K/uL    Comment: Performed at [facility], [place] [address], [place], [place] [address]   Magnesium     Status: None   Collection Time: 07/20/19  4:45 AM  Result Value Ref Range   Magnesium 1.7  1.7 - 2.4 mg/dL    Comment: Performed at [facility], [place] [address] [place], [place] [address]  Glucose, capillary     Status: None   Collection Time: 07/20/19  8:29 AM  Result Value Ref Range   Glucose-Capillary 98 70 - 99 mg/dL  Glucose, capillary     Status: Abnormal   Collection Time: 07/20/19 11:33 AM  Result Value Ref Range   Glucose-Capillary 116 (H) 70 - 99 mg/dL  Glucose, capillary     Status: Abnormal   Collection Time: 07/20/19  4:41 PM  Result Value Ref Range   Glucose-Capillary 113 (H) 70 - 99 mg/dL  Glucose, capillary     Status: Abnormal   Collection Time: 07/20/19  8:43 PM  Result Value Ref Range   Glucose-Capillary 105 (H) 70 - 99 mg/dL  Comprehensive metabolic panel     Status: Abnormal   Collection Time: 07/21/19  3:55 AM  Result Value Ref Range   Sodium 134 (L) 135 - 145 mmol/L   Potassium 4.2 3.5 - 5.1 mmol/L   Chloride 102 98 - 111 mmol/L   CO2 25 22 - 32 mmol/L   Glucose, Bld 99 70 - 99 mg/dL   BUN 13 8 - 23 mg/dL   Creatinine, Ser 1.09 (H) 0.44 - 1.00 mg/dL   Calcium 7.3 (L) 8.9 - 10.3 mg/dL   Total Protein 4.8 (L) 6.5 - 8.1 g/dL   Albumin 1.3 (L) 3.5 - 5.0 g/dL   AST 24 15 - 41 U/L   ALT 17 0 - 44 U/L   Alkaline Phosphatase 188 (H) 38 - 126 U/L   Total Bilirubin 0.5 0.3 - 1.2 mg/dL   GFR calc non Af Amer 52 (L) >60 mL/min   GFR calc Af Amer 60 (L) >60 mL/min   Anion gap 7 5 - 15    Comment: Performed at [facility], [place] [address] [place], [place] [address]  CBC with Differential/Platelet     Status: Abnormal   Collection Time: 07/21/19  3:55 AM  Result Value Ref Range   WBC 20.1 (H) 4.0 - 10.5 K/uL   RBC 3.23 (L) 3.87 - 5.11 MIL/uL   Hemoglobin 8.9 (L) 12.0 - 15.0 g/dL   HCT 29.6 (L) 36.0 - 46.0 %   MCV 91.6 80.0 - 100.0 fL   MCH 27.6 26.0 - 34.0 pg   MCHC 30.1 30.0 - 36.0 g/dL   RDW 19.7 (H)  11.5 - 15.5 %   Platelets 339 150 - 400 K/uL   nRBC 0.0 0.0 - 0.2 %   Neutrophils Relative % 68 %   Neutro Abs 13.5 (H) 1.7 - 7.7 K/uL   Lymphocytes Relative 24 %   Lymphs Abs 4.8 (H) 0.7 - 4.0 K/uL   Monocytes Relative 7 %   Monocytes Absolute 1.5 (H) 0.1 - 1.0 K/uL   Eosinophils Relative 0 %   Eosinophils Absolute 0.0 0.0 - 0.5 K/uL   Basophils Relative 0 %   Basophils Absolute 0.1 0.0 - 0.1 K/uL   Immature Granulocytes 1 %   Abs Immature Granulocytes 0.11 (H) 0.00 - 0.07 K/uL   Reactive, Benign Lymphocytes PRESENT     Comment: Performed at [facility], [place] [address], [place], [place] [address]  Magnesium     Status: None   Collection Time: 07/21/19  3:55 AM  Result Value Ref Range   Magnesium 1.7 1.7 - 2.4 mg/dL    Comment: Performed at [facility]  [place] [address] [place], [place] [address]  Glucose, capillary     Status: Abnormal   Collection Time: 07/21/19  8:20 AM  Result Value Ref Range   Glucose-Capillary 103 (H) 70 - 99 mg/dL  Glucose, capillary     Status: Abnormal   Collection Time: 07/21/19 11:25 AM  Result Value Ref Range   Glucose-Capillary 108 (H) 70 - 99 mg/dL  Glucose, capillary     Status: Abnormal   Collection Time: 07/21/19  6:52 PM  Result Value Ref Range   Glucose-Capillary 105 (H) 70 - 99 mg/dL  Glucose, capillary     Status: Abnormal   Collection Time: 07/21/19  8:02 PM  Result Value Ref Range   Glucose-Capillary 113 (H) 70 - 99 mg/dL    Ct Abdomen Pelvis Wo Contrast  Result Date: 07/21/2019 CLINICAL DATA:  Abdominal pain and fever.  Evaluate for an abscess. EXAM: CT ABDOMEN AND PELVIS WITHOUT CONTRAST TECHNIQUE: Multidetector CT imaging of the abdomen and pelvis was performed following the standard protocol without IV contrast. COMPARISON:  Chest CT 12/05/2010 FINDINGS: Lower chest: Bilateral pleural effusions, right side greater than left. Right pleural effusion may be moderate in size. Again noted is a  pericardial effusion and minimally changed from the prior chest CT examination. Hepatobiliary: Gallbladder is distended with high-density material suggestive for stones or sludge. Gallbladder measures up to 10 cm in largest dimension. Chery to moderate amount of perihepatic ascites. Limited evaluation of the liver on this noncontrast examination. No significant extrahepatic biliary dilatation. Pancreas: Unremarkable. No pancreatic ductal dilatation or surrounding inflammatory changes. Spleen: Normal appearance of the spleen without enlargement. Cannot exclude trace perisplenic ascites. Adrenals/Urinary Tract: Normal appearance of the adrenal glands. No definite kidney stones. No hydronephrosis. Urinary bladder is decompressed with a Foley catheter. Stomach/Bowel: Normal appearance of the stomach and duodenum. Diverticula involving the sigmoid colon. No evidence for bowel obstruction or focal bowel inflammation. Vascular/Lymphatic: Atherosclerotic calcifications involving the aorta and visceral arteries. Negative for an abdominal aortic aneurysm. Mildly prominent lymph nodes in the pelvis. Index lymph node measures 1.2 cm in the short axis along the right iliac chain on sequence 2, image 72. Mildly prominent lymph nodes are nonspecific. Reproductive: Uterus and bilateral adnexa are poorly characterized on this examination but appear to be unremarkable. Other: Severe subcutaneous edema. Perihepatic ascites. Soza amount of ascites in the right paracolic gutter. Schaab amount of ascites in the pelvis. Negative for free intraperitoneal air. Musculoskeletal: Multilevel disc space disease. Disc disease is most prominent at L4-L5 and T12-L1. IMPRESSION: 1. Distended gallbladder with high-density material that could represent stones or sludge. In addition, there is perihepatic ascites. These findings are concerning for acute cholecystitis. Consider further characterization with a right upper quadrant ultrasound. 2. Bilateral  pleural effusions, right side greater than left. Severe subcutaneous edema. Findings could represent fluid overload state. 3. Persistent pericardial effusion. Minimal change since exam in 2012. 4. Borderline lymphadenopathy in the pelvis. Pelvic lymph nodes are nonspecific but could be reactive. Electronically Signed   By: Markus Daft M.D.   On: 07/21/2019 13:09   Dg Chest Port 1 View  Result Date: 07/21/2019 CLINICAL DATA:  Covid positive.  Short of breath EXAM: PORTABLE CHEST 1 VIEW COMPARISON:  Radiograph 07/11/2019 FINDINGS: PICC line unchanged. Normal cardiac silhouette. No effusion, infiltrate or pneumothorax. Mild LEFT basilar atelectasis. IMPRESSION: 1. No significant change. 2. No evidence pneumonia. 3. Mild LEFT basilar atelectasis. Electronically Signed   By: Suzy Bouchard M.D.   On:  07/21/2019 10:39   Dg Abd Portable 1v  Result Date: 07/21/2019 CLINICAL DATA:  COVID positive pt. Pt c/o abdominal pain EXAM: PORTABLE ABDOMEN - 1 VIEW COMPARISON:  07/11/2019 FINDINGS: Bowel gas pattern is nonobstructed. There is heterogeneous lucency projecting LATERAL to the LEFT chest wall. This could be related to stomach and significant abdominal wall laxity. Is difficult to exclude abscess or loculated extraluminal gas. Consider CT of the abdomen and pelvis as needed. Multiple remote rib fractures. IMPRESSION: 1. Abnormal lucency to the LEFT chest wall consistent with stomach or loculated extraluminal air. Consider CT of the abdomen and pelvis. 2. Multiple remote rib fractures. These results were called by telephone at the time of interpretation on 07/21/2019 at 10:12 am to provider [facility] , who verbally acknowledged these results. Electronically Signed   By: Nolon Nations M.D.   On: 07/21/2019 10:43   US Abdomen Limited Ruq  Result Date: 07/21/2019 CLINICAL DATA:  68 year old female with abdominal pain and fever. EXAM: ULTRASOUND ABDOMEN LIMITED RIGHT UPPER QUADRANT COMPARISON:  CT of the  abdomen pelvis dated 07/21/2019. FINDINGS: Evaluation is very limited due to patient's body habitus and inability of the patient to cooperate with the exam. Gallbladder: There is sludge and stone within the gallbladder. There is no gallbladder wall thickening or pericholecystic fluid. Evaluation of the mortise sign was limited. Common bile duct: Diameter: 3 mm. Liver: There is diffuse increased liver echogenicity most commonly seen in the setting of fatty infiltration. Superimposed inflammation or fibrosis is not excluded. Evaluation of the liver is limited due to body habitus and ascites. Portal vein is patent on color Doppler imaging with normal direction of blood flow towards the liver. Other: Plunk upper abdominal and perihepatic ascites. IMPRESSION: 1. Gallbladder sludge and stones without definite sonographic evidence of acute cholecystitis. Evaluation however is limited on this ultrasound. A hepatobiliary scintigraphy may provide better evaluation of the gallbladder if there is a high clinical concern for acute cholecystitis . 2. Fatty liver. 3. Ascites. Electronically Signed   By: Anner Crete M.D.   On: 07/21/2019 15:42    Review of Systems  Constitutional: Positive for chills and malaise/fatigue.  Gastrointestinal: Positive for diarrhea, nausea and vomiting.  Neurological: Positive for weakness.  All other systems reviewed and are negative.  Blood pressure 116/62, pulse 95, temperature 98.6 F (37 C), temperature source Oral, resp. rate 18, height 5' 4.02" (1.626 m), weight 128.6 kg, SpO2 94 %. Physical Exam  Vitals reviewed. Constitutional: She is oriented to person, place, and time. She appears well-developed. No distress.  Severe obesity, central; mildly ill appearance  HENT:  Head: Normocephalic and atraumatic.  Right Ear: External ear normal.  Left Ear: External ear normal.  Eyes: Conjunctivae are normal. No scleral icterus.  Neck: Normal range of motion. Neck supple. No  tracheal deviation present. No thyromegaly present.  Cardiovascular: Normal heart sounds and intact distal pulses.  Mild tachycardia  Respiratory: Effort normal and breath sounds normal. No stridor. No respiratory distress. She has no wheezes.  GI: Soft. There is no rigidity, no rebound and no guarding.    Mild Tenderness to deep palpation in RUQ; skin breakdown in mid abd skin fold.   Musculoskeletal:        General: No tenderness or edema.  Lymphadenopathy:    She has no cervical adenopathy.  Neurological: She is alert and oriented to person, place, and time. She exhibits normal muscle tone.  Skin: Skin is warm and dry. No rash noted. She is not  diaphoretic. No erythema. No pallor.  Anasarca; 2+ edema b/l LE  Psychiatric: She has a normal mood and affect. Her behavior is normal. Judgment and thought content normal.    Assessment/Plan: Covid + without resp symptoms Severe C diff colitis Severe protein calorie malnutrition Severe obesity AKI - resolved Nausea/vomiting/Leukocytosis DM2 Deconditioning Obstructive sleep apnea Hepatic steatosis  She does not really have much tenderness on exam.  Her primary symptoms have been nausea and vomiting with a rising leukocytosis.  Ultrasound was inconclusive for acute cholecystitis.  Agree with nuclear medicine scan of the gallbladder to evaluate for cholecystitis  Agree with antibiotics for possible cholecystitis although have to balance that with her C. difficile infection potentially worsening  Consider switching to vancomycin enema until the patient can tolerate her oral vancomycin  We will try Carafate to see if that will help with her sensation of esophageal burning  Leighton Ruff. Redmond Pulling, MD, FACS General, Bariatric, & Minimally Invasive Surgery [facility]   Greer Pickerel 07/22/2019, 1:02 AM

## 2019-07-22 NOTE — Consult Note (Addendum)
[place] for Infectious Disease    Date of Admission:  07/04/2019     Total days of antibiotics: 18            Reason for Consult: Antibiotic regimen     Referring Provider: Cristal Deer, MD Primary Care Provider: Billey Gosling, MD  Assessment: In summary, Alice Kemp is a 68 y.o female with a history significant for hypothyroidism, obesity, dyslipidemia, 123456, chronic diastolic HF, CKD stage III, lumbar laminectomy, and COVID-19 who presented initially for C-diff colitis, and medical course has been complicated by new leukocytosis, nausea, and vomiting. The patient's abdominal imaging suggest acute cholecystitis may be present.  Plan: 1. Continue Vancomycin, Unasyn, and Flagyl  2. General surgery plan on doing nuclear scan to evaluate for cholecystitis given inconclusive RUQ ultrasound  Principal Problem:   Encephalopathy due to COVID-19 virus Active Problems:   Uncontrolled type 2 diabetes mellitus with stage 3 chronic kidney disease (HCC)   Morbid obesity (HCC)   Hypothyroidism   Depression   CKD (chronic kidney disease), stage III (HCC)   Chronic diastolic heart failure (HCC)   Essential hypertension   AKI (acute kidney injury) ([place])   AMS (altered mental status)   Pressure injury of skin  . aspirin  81 mg Oral Daily  . buPROPion  100 mg Oral BID  . Chlorhexidine Gluconate Cloth  6 each Topical Daily  . DULoxetine  30 mg Oral BID  . [START ON 07/23/2019] enoxaparin (LOVENOX) injection  60 mg Subcutaneous Q24H  . feeding supplement (ENSURE ENLIVE)  237 mL Oral TID BM  . feeding supplement (PRO-STAT SUGAR FREE 64)  30 mL Oral TID WC  . gabapentin  300 mg Oral TID  . Gerhardt's butt cream  1 application Topical TID  . insulin aspart  0-15 Units Subcutaneous Q6H  . levothyroxine  88 mcg Oral QAC breakfast  . midodrine  10 mg Oral BID WC  . multivitamin with minerals  1 tablet Oral Daily  . rosuvastatin  5 mg Oral Daily  . sodium chloride flush  10-40 mL  Intracatheter Q12H  . sucralfate  1 g Oral TID WC & HS  . vancomycin  500 mg Oral Q6H   HPI: Alice Kemp is a 68 y.o. female with a  PMHx significant for hypothyroidism, obesity, dyslipidemia, 123456, chronic diastolic HF, CKD stage III, lumbar laminectomy, and COVID-19 who presented with profuse diarrhea and was found to have C-diff colitis and AKI. The patient completed an course of oral vancomycin and IV flagyl. Her clinical course was improving, however became complicated by developing N/V and leukocytosis (20.1) as of yesterday. CT abdomen was signficant for findings consistent with acute cholecystitis. RUQ u/s showed stones, sludge but did not show evidence of wall thickening and some fatty liver findings.   The patient says her stools are loose, but are more formed than previous days when she was getting treated for C-diff. She says she thinks she had more than 5 bowel movements yesterday, however only 1 was documented in the chart. In any case, the patient is not experiencing intraabdominal pain or discomfort when she has a bowel movement. She does complain of superficial abdominal pain on her skin.   Review of Systems: All systems have been reviewed and are otherwise negative unless mentioned in the HPI.   Past Medical History:  Diagnosis Date  . Anxiety   . Arthritis   . Back pain   . Benign neoplasm of colon 06/29/2012  . Breast  abscess 07/2011   left; s/p I&D + abx by gen surg  . Cervical radiculopathy at C6   . CKD (chronic kidney disease) stage 3, GFR 30-59 ml/min (HCC)    follows with renal  . Constipation   . Depression   . Diabetes mellitus type II   . Diastolic heart failure ([place]) 09/2013 echo  . Gall bladder inflammation   . Glaucoma   . Gout   . Hyperlipidemia   . Hypothyroidism   . Morbid obesity ([place])   . OBSTRUCTIVE SLEEP APNEA 12/23/2010   npsg 2012:  AHI 25/hr, severe desat to 68%.   autoset 2013: optimal pressure 13cm   . PERICARDIAL EFFUSION 12/12/2010  .  Pulmonary hypertension ([place])   . Vitamin B12 deficiency   . Vitamin D deficiency    Social History   Tobacco Use  . Smoking status: Never Smoker  . Smokeless tobacco: Never Used  Substance Use Topics  . Alcohol use: No    Alcohol/week: 0.0 standard drinks    Frequency: Never  . Drug use: No   Family History  Problem Relation Age of Onset  . Hypertension Mother   . Heart disease Mother   . Heart attack Mother   . Thyroid disease Mother   . Diabetes Father   . Heart disease Father   . Skin cancer Father   . Cancer Father        skin  . Heart attack Father   . Hyperlipidemia Father   . Colon polyps Father   . Depression Father   . Hypertension Brother   . Skin cancer Paternal Grandfather   . Colon cancer Paternal Grandfather 52  . Breast cancer Paternal Aunt   . Cancer Paternal Aunt        breast  . Diabetes Paternal Uncle   . Diabetes Maternal Grandfather   . Sudden death Neg Hx    Allergies  Allergen Reactions  . Morphine And Related Other (See Comments)    Behavioral Issues  . Sulfa Antibiotics    OBJECTIVE: Blood pressure 113/73, pulse 98, temperature 98.5 F (36.9 C), temperature source Oral, resp. rate 18, height 5' 4.02" (1.626 m), weight 128.6 kg, SpO2 96 %.  Physical Exam Vitals signs reviewed.  Constitutional:      General: She is not in acute distress.    Appearance: She is obese. She is ill-appearing. She is not toxic-appearing.  HENT:     Head: Normocephalic and atraumatic.  Eyes:     General: No scleral icterus.       Right eye: No discharge.        Left eye: No discharge.     Extraocular Movements: Extraocular movements intact.  Pulmonary:     Effort: Pulmonary effort is normal.  Abdominal:     General: There is no distension.     Tenderness: There is abdominal tenderness (at site of rash). There is no guarding.     Comments: Dry, erythematous linear rash at lower abdominal fold   Neurological:     General: No focal deficit present.      Mental Status: She is alert.  Psychiatric:        Mood and Affect: Mood normal.    Lab Results Lab Results  Component Value Date   WBC 20.1 (H) 07/21/2019   HGB 8.9 (L) 07/21/2019   HCT 29.6 (L) 07/21/2019   MCV 91.6 07/21/2019   PLT 339 07/21/2019    Lab Results  Component Value Date  CREATININE 1.09 (H) 07/21/2019   BUN 13 07/21/2019   NA 134 (L) 07/21/2019   K 4.2 07/21/2019   CL 102 07/21/2019   CO2 25 07/21/2019    Lab Results  Component Value Date   ALT 17 07/21/2019   AST 24 07/21/2019   ALKPHOS 188 (H) 07/21/2019   BILITOT 0.5 07/21/2019   Microbiology: No results found for this or any previous visit (from the past 240 hour(s)).  Earlene Plater, MD Internal Medicine, PGY1 Pager: [phone]  07/22/2019,11:40 AM

## 2019-07-22 NOTE — Progress Notes (Signed)
PT Cancellation Note  Patient Details Name: Alice Kemp MRN: [id number] DOB: 09-30-1951   Cancelled Treatment:    Reason Eval/Treat Not Completed: Other (comment)(Refused per nurse. )   Godfrey Pick Kingstin Heims 07/22/2019, 12:55 PM

## 2019-07-22 NOTE — Consult Note (Signed)
[facility] Nurse wound consult note Reason for Consult: Patient seen by my partner, S. Doty on 10/5 for Stage 2 sacral pressure injury in the presence of C. Diff diarrhea. A silicone foam dressing is in place for the partial thickness tissue loss in that area. Gerhart's butt cream has been ordered and is still in place for those areas affected (buttocks, medial and posterior thighs) by fecal incontinence. [facility] nurse is re consulted today for intertriginous dermatitis at the abdominal skin fold. Wound type:moisture, specifically skin fold (intertriginous) dermatitis (ITD) Pressure Injury POA: Yes, Stage 2 to sacrum, see above. Measurement: no change, 2cm x 1.5cm x 0.1cm Wound bed: pink, moist Drainage (amount, consistency, odor) scant serous Periwound:intact, erythematous Dressing procedure/placement/frequency: I will implement a mattress replacement to address microclimate in addition to reducing pressure and shear.  Patient turning side to side and minimizing time in the supine position is in place and will augment POC.  The abdominal dermatitis, intertriginous will be addressed using our house antimicrobial textile, InterDry Ag+.  Orders and instructions for use have been placed in Orders and below, for your convenience.  Measure and cut length of InterDry Ag+ to fit in skin folds that have skin breakdown Tuck InterDry  Ag+ fabric into skin folds in a single layer, allow for 2 inches of overhang from skin edges to allow for wicking to occur May remove to bathe; dry area thoroughly and then tuck into affected areas again Do not apply any creams or ointments when using InterDry Ag+ DO NOT THROW AWAY FOR 5 DAYS unless soiled with stool DO NOT [facility] product, this will inactivate the silver in the material  New sheet of Interdry Ag+ should be applied after 5 days of use if patient continues to have skin breakdown     [facility] nursing team will not follow, but will remain available to this patient, the nursing and  medical teams.  Please re-consult if needed. Thanks, Maudie Flakes, MSN, RN, [place], Arther Abbott  Pager# [phone]

## 2019-07-22 NOTE — Progress Notes (Addendum)
PROGRESS NOTE  Alice Kemp [id number] DOB: November 06, 1950 DOA: 07/04/2019 PCP: [name], MD  Brief Narrative: Alice Kemp is a 68 year old female with medical history significant for hypothyroidism obstructive sleep apnea morbid obesity diastolic dysfunction CHF stage III chronic kidney disease who presented with a fall.  She also was treated for COVID-19 infection in September she does not have any more COVID symptoms at this time she is being considered for and treated for cholecystitis that was equivocal on ultrasound but a HIDA scan was done today and it does confirm cholecystitis she is currently on antibiotics for C. difficile she was transferred from another hospital yesterday for further care at [facility] cones   Interval history/Subjective: Patient seen and examined at bedside she still complaining a lot of nausea but no vomiting and no abdominal pain.  Her stool is still slightly loose but better.  She has not been able to take her oral vancomycin because of the nausea.  She underwent HIDA scan today  Principal Problem:   Encephalopathy due to COVID-19 virus Active Problems:   Uncontrolled type 2 diabetes mellitus with stage 3 chronic kidney disease (HCC)   Morbid obesity (HCC)   Hypothyroidism   Depression   CKD (chronic kidney disease), stage III (HCC)   Chronic diastolic heart failure (HCC)   Essential hypertension   AKI (acute kidney injury) ([place])   AMS (altered mental status)   Pressure injury of skin  Assessment/Plan #1 acute cholecystitis.  HIDA scan reports reviewed. Continue IV antibiotics with Unasyn infectious disease and surgery are on board  2.  Generalized lower extremity weakness of 6 to 8 months duration due to her most recent COVID infection likely.  Continue physical therapy.  3.  COVID infection.  Patient is asymptomatic and she has been treated will discontinue droplets precaution  4.  C. difficile colitis her diarrhea is improving but she is not  able to continue oral or take oral vancomycin consistently because of her nausea  5.  Persistent nausea continue symptomatic treatment  6.  Chronic diastolic heart failure she has lower extremity edema but not severely  DVT prophylaxis: Aspirin Code Status: Full Family Communication: None at bedside Disposition Plan: To be determined Time spent over 30 minutes  Dr. Kyung Bacca [facility] Pager [phone]  07/22/2019, 10:25 AM  LOS: 18 days   Consultants:  Infectious disease  Procedures:  HIDA scan  Antimicrobials:  Unasyn IV   Objective: Vitals:  Vitals:   07/21/19 1956 07/22/19 0500  BP: 116/62 113/73  Pulse: 95 98  Resp: 18 18  Temp: 98.6 F (37 C) 98.5 F (36.9 C)  SpO2: 94% 96%    Exam:  Constitutional:  . Appears calm and comfortable morbidly obese Eyes:  . pupils and irises appear normal . Normal lids and conjunctivae ENMT:  . grossly normal hearing  . Lips appear normal . external ears, nose appear normal . Oropharynx: mucosa, tongue,posterior pharynx appear normal Neck:  . neck appears normal, no masses, normal ROM, supple . no thyromegaly Respiratory:  . CTA bilaterally, no w/r/r.  . Respiratory effort normal. No retractions or accessory muscle use Cardiovascular:  . RRR, no m/r/g . Trace LE extremity edema   . Normal pedal pulses Abdomen:  . Abdomen appears normal; no tenderness or masses . No hernias . No HSM Musculoskeletal:  . Digits/nails BUE: no clubbing, cyanosis, petechiae, infection . exam of joints, bones, muscles of at least one of following: head/neck, RUE, LUE, RLE, LLE  o strength and tone normal, no atrophy, no abnormal movements o No tenderness, masses o Normal ROM, no contractures  .  Skin:  . Intertrigo of abdominal skin fold . palpation of skin: no induration or nodules Neurologic:  . CN 2-12 intact .  Psychiatric:  . Mental status o Mood, affect appropriate o Orientation to person, place, time  .  judgment and insight appear intact     I have personally reviewed the following:   Data:Study Result  CLINICAL DATA:  68 year old female with acute abdominal pain.  EXAM: NUCLEAR MEDICINE HEPATOBILIARY IMAGING  TECHNIQUE: Sequential images of the abdomen were obtained out to 60 minutes following intravenous administration of radiopharmaceutical.  RADIOPHARMACEUTICALS:  4.9 mCi Tc-34m  Choletec IV  COMPARISON:  07/21/2019 ultrasound and CT  FINDINGS: Prompt uptake and biliary excretion of activity by the liver is seen. Biliary activity passes into Corella bowel, consistent with patent common bile duct.  Gallbladder activity is not visualized after 2 hours post radiopharmaceutical injection.  The patient was not given morphine due to allergy.  IMPRESSION: Nonvisualization of the gallbladder which is suspicious for cystic duct obstruction/acute cholecystitis. However, correlate with causes of false-positive studies as morphine could not be given to constrict the sphincter of Oddi due to allergy.  These results will be called to the ordering clinician or representative by the Radiologist Assistant, and communication documented in the PACS or zVision Dashboard.      Scheduled Meds: . aspirin  81 mg Oral Daily  . buPROPion  100 mg Oral BID  . Chlorhexidine Gluconate Cloth  6 each Topical Daily  . DULoxetine  30 mg Oral BID  . enoxaparin (LOVENOX) injection  55 mg Subcutaneous Q24H  . feeding supplement (ENSURE ENLIVE)  237 mL Oral TID BM  . feeding supplement (PRO-STAT SUGAR FREE 64)  30 mL Oral TID WC  . gabapentin  300 mg Oral TID  . Gerhardt's butt cream  1 application Topical TID  . insulin aspart  0-15 Units Subcutaneous Q6H  . levothyroxine  88 mcg Oral QAC breakfast  . midodrine  10 mg Oral BID WC  . multivitamin with minerals  1 tablet Oral Daily  . rosuvastatin  5 mg Oral Daily  . sodium chloride flush  10-40 mL Intracatheter Q12H  . sucralfate  1  g Oral TID WC & HS  . vancomycin  500 mg Oral Q6H   Continuous Infusions: . ampicillin-sulbactam (UNASYN) IV 200 mL/hr at 07/22/19 0531  . lactated ringers Stopped (07/22/19 0531)  . metronidazole 500 mg (07/22/19 0531)    Principal Problem:   Encephalopathy due to COVID-19 virus Active Problems:   Uncontrolled type 2 diabetes mellitus with stage 3 chronic kidney disease (HCC)   Morbid obesity (HCC)   Hypothyroidism   Depression   CKD (chronic kidney disease), stage III (HCC)   Chronic diastolic heart failure (HCC)   Essential hypertension   AKI (acute kidney injury) ([place])   AMS (altered mental status)   Pressure injury of skin   LOS: 18 days

## 2019-07-23 ENCOUNTER — Inpatient Hospital Stay (HOSPITAL_COMMUNITY): Payer: PPO

## 2019-07-23 ENCOUNTER — Encounter (HOSPITAL_COMMUNITY): Payer: Self-pay | Admitting: Diagnostic Radiology

## 2019-07-23 DIAGNOSIS — U071 COVID-19: Secondary | ICD-10-CM | POA: Diagnosis not present

## 2019-07-23 DIAGNOSIS — G9349 Other encephalopathy: Secondary | ICD-10-CM | POA: Diagnosis not present

## 2019-07-23 HISTORY — PX: IR PARACENTESIS: IMG2679

## 2019-07-23 HISTORY — PX: IR PERC CHOLECYSTOSTOMY: IMG2326

## 2019-07-23 LAB — CBC WITH DIFFERENTIAL/PLATELET
Abs Immature Granulocytes: 0.14 10*3/uL — ABNORMAL HIGH (ref 0.00–0.07)
Basophils Absolute: 0.1 10*3/uL (ref 0.0–0.1)
Basophils Relative: 0 %
Eosinophils Absolute: 0.2 10*3/uL (ref 0.0–0.5)
Eosinophils Relative: 1 %
HCT: 25.9 % — ABNORMAL LOW (ref 36.0–46.0)
Hemoglobin: 8.1 g/dL — ABNORMAL LOW (ref 12.0–15.0)
Immature Granulocytes: 1 %
Lymphocytes Relative: 23 %
Lymphs Abs: 3.8 10*3/uL (ref 0.7–4.0)
MCH: 28.4 pg (ref 26.0–34.0)
MCHC: 31.3 g/dL (ref 30.0–36.0)
MCV: 90.9 fL (ref 80.0–100.0)
Monocytes Absolute: 1.1 10*3/uL — ABNORMAL HIGH (ref 0.1–1.0)
Monocytes Relative: 7 %
Neutro Abs: 10.8 10*3/uL — ABNORMAL HIGH (ref 1.7–7.7)
Neutrophils Relative %: 68 %
Platelets: 375 10*3/uL (ref 150–400)
RBC: 2.85 MIL/uL — ABNORMAL LOW (ref 3.87–5.11)
RDW: 21 % — ABNORMAL HIGH (ref 11.5–15.5)
WBC: 16.1 10*3/uL — ABNORMAL HIGH (ref 4.0–10.5)
nRBC: 0 % (ref 0.0–0.2)

## 2019-07-23 LAB — COMPREHENSIVE METABOLIC PANEL
ALT: 13 U/L (ref 0–44)
AST: 17 U/L (ref 15–41)
Albumin: 1.1 g/dL — ABNORMAL LOW (ref 3.5–5.0)
Alkaline Phosphatase: 159 U/L — ABNORMAL HIGH (ref 38–126)
Anion gap: 8 (ref 5–15)
BUN: 20 mg/dL (ref 8–23)
CO2: 22 mmol/L (ref 22–32)
Calcium: 7.1 mg/dL — ABNORMAL LOW (ref 8.9–10.3)
Chloride: 107 mmol/L (ref 98–111)
Creatinine, Ser: 1.67 mg/dL — ABNORMAL HIGH (ref 0.44–1.00)
GFR calc Af Amer: 36 mL/min — ABNORMAL LOW (ref >60)
GFR calc non Af Amer: 31 mL/min — ABNORMAL LOW (ref >60)
Glucose, Bld: 128 mg/dL — ABNORMAL HIGH (ref 70–99)
Potassium: 4.3 mmol/L (ref 3.5–5.1)
Sodium: 137 mmol/L (ref 135–145)
Total Bilirubin: 0.5 mg/dL (ref 0.3–1.2)
Total Protein: 4.5 g/dL — ABNORMAL LOW (ref 6.5–8.1)

## 2019-07-23 LAB — GLUCOSE, CAPILLARY
Glucose-Capillary: 102 mg/dL — ABNORMAL HIGH (ref 70–99)
Glucose-Capillary: 105 mg/dL — ABNORMAL HIGH (ref 70–99)
Glucose-Capillary: 105 mg/dL — ABNORMAL HIGH (ref 70–99)
Glucose-Capillary: 106 mg/dL — ABNORMAL HIGH (ref 70–99)
Glucose-Capillary: 119 mg/dL — ABNORMAL HIGH (ref 70–99)
Glucose-Capillary: 143 mg/dL — ABNORMAL HIGH (ref 70–99)
Glucose-Capillary: 94 mg/dL (ref 70–99)

## 2019-07-23 LAB — PROTIME-INR
INR: 1.4 — ABNORMAL HIGH (ref 0.8–1.2)
Prothrombin Time: 17.3 seconds — ABNORMAL HIGH (ref 11.4–15.2)

## 2019-07-23 MED ORDER — SODIUM CHLORIDE 0.9% FLUSH
5.0000 mL | Freq: Three times a day (TID) | INTRAVENOUS | Status: DC
  Administered 2019-07-24 – 2019-08-05 (×32): 5 mL

## 2019-07-23 MED ORDER — IOHEXOL 300 MG/ML  SOLN
50.0000 mL | Freq: Once | INTRAMUSCULAR | Status: AC | PRN
  Administered 2019-07-23: 17:00:00 16 mL

## 2019-07-23 MED ORDER — MIDAZOLAM HCL 2 MG/2ML IJ SOLN
INTRAMUSCULAR | Status: AC | PRN
  Administered 2019-07-23: 1 mg via INTRAVENOUS

## 2019-07-23 MED ORDER — MIDAZOLAM HCL 2 MG/2ML IJ SOLN
INTRAMUSCULAR | Status: AC
  Filled 2019-07-23: qty 6

## 2019-07-23 MED ORDER — FENTANYL CITRATE (PF) 100 MCG/2ML IJ SOLN
INTRAMUSCULAR | Status: AC | PRN
  Administered 2019-07-23: 50 ug via INTRAVENOUS

## 2019-07-23 MED ORDER — LIDOCAINE HCL 1 % IJ SOLN
INTRAMUSCULAR | Status: AC
  Filled 2019-07-23: qty 20

## 2019-07-23 MED ORDER — ALUM & MAG HYDROXIDE-SIMETH 200-200-20 MG/5ML PO SUSP
30.0000 mL | Freq: Four times a day (QID) | ORAL | Status: DC | PRN
  Administered 2019-07-23: 30 mL via ORAL
  Filled 2019-07-23: qty 30

## 2019-07-23 MED ORDER — FENTANYL CITRATE (PF) 100 MCG/2ML IJ SOLN
INTRAMUSCULAR | Status: AC
  Filled 2019-07-23: qty 4

## 2019-07-23 NOTE — Progress Notes (Signed)
Pt was complaining of pain after brushing her teeth, she said whenever she swallow it hurts, v/s stable given tylenol paged Dr. Kyung Bacca and surgeon aware, will continue to monitor.

## 2019-07-23 NOTE — Progress Notes (Signed)
[facility]/Trauma Progress Note      Assessment/Plan Covid + without resp symptoms Severe C diff colitis Severe protein calorie malnutrition Severe obesity AKI - resolved DM2 Deconditioning Obstructive sleep apnea Hepatic steatosis  Nausea/vomiting/Leukocytosis - [name] and CT concerning for cholecystitis - HIDA did not show filling of gallbladder, morphine was unable to be administered - Recommend perc chole drain over surgery at this time  FEN: CLD after drain VTE: SCD's, lovenox ID: Flagyl Unasyn Dificid  Follow up: TBD  DISPO: do not recommend surgery at this time. Will order perc chole by IR. Pt is at high risk for complications 2/2 c diff and recent COVID positive testing.     LOS: 19 days    Subjective: CC: nausea and vomiting  Last episode of emesis was yesterday. Pt denies abdominal pain. She has not had any diarrhea since arrival to [facility]. She is agreeable to perc chole at this time.   Objective: Vital signs in last 24 hours: Temp:  [97.7 F (36.5 C)-98.4 F (36.9 C)] 98.4 F (36.9 C) (10/17 0545) Pulse Rate:  [67-103] 67 (10/17 0545) Resp:  [18] 18 (10/17 0545) BP: (110-125)/(50-57) 110/50 (10/17 0545) SpO2:  [94 %-96 %] 96 % (10/17 0545) Weight:  [id number] kg] 127 kg (10/17 0500) Last BM Date: 07/22/19  Intake/Output from previous day: 10/16 0701 - 10/17 0700 In: 665.4 [IV Piggyback:665.4] Out: 225 [Urine:225] Intake/Output this shift: No intake/output data recorded.  PE: Gen:  Alert, NAD, pleasant, cooperative Pulm:  Rate and effort normal Abd: Soft, ND, no HSM, obese, mild RUQ TTP without guarding, no peritonitis  Skin: no rashes noted, warm and dry   Anti-infectives: Anti-infectives (From admission, onward)   Start     Dose/Rate Route Frequency Ordered Stop   07/22/19 2200  fidaxomicin (DIFICID) tablet 200 mg     200 mg Oral 2 times daily 07/22/19 1626     07/21/19 1600  Ampicillin-Sulbactam (UNASYN) 3 g in sodium chloride 0.9 %  100 mL IVPB     3 g 200 mL/hr over 30 Minutes Intravenous Every 6 hours 07/21/19 1448     07/15/19 1400  metroNIDAZOLE (FLAGYL) IVPB 500 mg     500 mg 100 mL/hr over 60 Minutes Intravenous Every 8 hours 07/15/19 0953     07/13/19 1000  metroNIDAZOLE (FLAGYL) tablet 500 mg  Status:  Discontinued     500 mg Oral Every 8 hours 07/13/19 0952 07/15/19 0953   07/12/19 0400  metroNIDAZOLE (FLAGYL) IVPB 500 mg  Status:  Discontinued     500 mg 100 mL/hr over 60 Minutes Intravenous Every 8 hours 07/11/19 1935 07/13/19 0952   07/11/19 1200  metroNIDAZOLE (FLAGYL) IVPB 500 mg  Status:  Discontinued     500 mg 100 mL/hr over 60 Minutes Intravenous Every 8 hours 07/11/19 1055 07/11/19 1935   07/09/19 2130  vancomycin (VANCOCIN) 50 mg/mL oral solution 500 mg  Status:  Discontinued     500 mg Oral Every 6 hours 07/08/19 2104 07/08/19 2146   07/08/19 2200  vancomycin (VANCOCIN) 50 mg/mL oral solution 500 mg  Status:  Discontinued     500 mg Oral Every 6 hours 07/08/19 2146 07/22/19 1626   07/05/19 1600  remdesivir 100 mg in sodium chloride 0.9 % 250 mL IVPB  Status:  Discontinued     100 mg 500 mL/hr over 30 Minutes Intravenous Every 24 hours 07/04/19 1517 07/05/19 0734   07/04/19 1600  remdesivir 200 mg in sodium chloride 0.9 % 250  mL IVPB     200 mg 500 mL/hr over 30 Minutes Intravenous Once 07/04/19 1517 07/04/19 1941   07/04/19 1400  vancomycin (VANCOCIN) 50 mg/mL oral solution 500 mg  Status:  Discontinued     500 mg Oral Every 6 hours 07/04/19 1134 07/08/19 2104      Lab Results:  Recent Labs    07/21/19 0355 07/23/19 0359  WBC 20.1* 16.1*  HGB 8.9* 8.1*  HCT 29.6* 25.9*  PLT 339 375   BMET Recent Labs    07/21/19 0355 07/23/19 0359  NA 134* 137  K 4.2 4.3  CL 102 107  CO2 25 22  GLUCOSE 99 128*  BUN 13 20  CREATININE 1.09* 1.67*  CALCIUM 7.3* 7.1*   PT/INR No results for input(s): LABPROT, INR in the last 72 hours. CMP     Component Value Date/Time   NA 137 07/23/2019  0359   NA 137 10/14/2018 1428   K 4.3 07/23/2019 0359   CL 107 07/23/2019 0359   CO2 22 07/23/2019 0359   GLUCOSE 128 (H) 07/23/2019 0359   BUN 20 07/23/2019 0359   BUN 18 10/14/2018 1428   CREATININE 1.67 (H) 07/23/2019 0359   CALCIUM 7.1 (L) 07/23/2019 0359   PROT 4.5 (L) 07/23/2019 0359   PROT 7.5 10/14/2018 1428   ALBUMIN 1.1 (L) 07/23/2019 0359   ALBUMIN 3.7 10/14/2018 1428   AST 17 07/23/2019 0359   ALT 13 07/23/2019 0359   ALKPHOS 159 (H) 07/23/2019 0359   BILITOT 0.5 07/23/2019 0359   BILITOT 0.3 10/14/2018 1428   GFRNONAA 31 (L) 07/23/2019 0359   GFRAA 36 (L) 07/23/2019 0359   Lipase  No results found for: LIPASE  Studies/Results: Ct Abdomen Pelvis Wo Contrast  Result Date: 07/21/2019 CLINICAL DATA:  Abdominal pain and fever.  Evaluate for an abscess. EXAM: CT ABDOMEN AND PELVIS WITHOUT CONTRAST TECHNIQUE: Multidetector CT imaging of the abdomen and pelvis was performed following the standard protocol without IV contrast. COMPARISON:  Chest CT 12/05/2010 FINDINGS: Lower chest: Bilateral pleural effusions, right side greater than left. Right pleural effusion may be moderate in size. Again noted is a pericardial effusion and minimally changed from the prior chest CT examination. Hepatobiliary: Gallbladder is distended with high-density material suggestive for stones or sludge. Gallbladder measures up to 10 cm in largest dimension. Mollett to moderate amount of perihepatic ascites. Limited evaluation of the liver on this noncontrast examination. No significant extrahepatic biliary dilatation. Pancreas: Unremarkable. No pancreatic ductal dilatation or surrounding inflammatory changes. Spleen: Normal appearance of the spleen without enlargement. Cannot exclude trace perisplenic ascites. Adrenals/Urinary Tract: Normal appearance of the adrenal glands. No definite kidney stones. No hydronephrosis. Urinary bladder is decompressed with a Foley catheter. Stomach/Bowel: Normal appearance of  the stomach and duodenum. Diverticula involving the sigmoid colon. No evidence for bowel obstruction or focal bowel inflammation. Vascular/Lymphatic: Atherosclerotic calcifications involving the aorta and visceral arteries. Negative for an abdominal aortic aneurysm. Mildly prominent lymph nodes in the pelvis. Index lymph node measures 1.2 cm in the short axis along the right iliac chain on sequence 2, image 72. Mildly prominent lymph nodes are nonspecific. Reproductive: Uterus and bilateral adnexa are poorly characterized on this examination but appear to be unremarkable. Other: Severe subcutaneous edema. Perihepatic ascites. Lopezperez amount of ascites in the right paracolic gutter. Vigil amount of ascites in the pelvis. Negative for free intraperitoneal air. Musculoskeletal: Multilevel disc space disease. Disc disease is most prominent at L4-L5 and T12-L1. IMPRESSION: 1. Distended gallbladder with  high-density material that could represent stones or sludge. In addition, there is perihepatic ascites. These findings are concerning for acute cholecystitis. Consider further characterization with a right upper quadrant ultrasound. 2. Bilateral pleural effusions, right side greater than left. Severe subcutaneous edema. Findings could represent fluid overload state. 3. Persistent pericardial effusion. Minimal change since exam in 2012. 4. Borderline lymphadenopathy in the pelvis. Pelvic lymph nodes are nonspecific but could be reactive. Electronically Signed   By: Markus Daft M.D.   On: 07/21/2019 13:09   Nm Hepatobiliary Liver Func  Result Date: 07/22/2019 CLINICAL DATA:  68 year old female with acute abdominal pain. EXAM: NUCLEAR MEDICINE HEPATOBILIARY IMAGING TECHNIQUE: Sequential images of the abdomen were obtained out to 60 minutes following intravenous administration of radiopharmaceutical. RADIOPHARMACEUTICALS:  4.9 mCi Tc-30m  Choletec IV COMPARISON:  07/21/2019 ultrasound and CT FINDINGS: Prompt uptake and  biliary excretion of activity by the liver is seen. Biliary activity passes into Gar[place] bowel, consistent with patent common bile duct. Gallbladder activity is not visualized after 2 hours post radiopharmaceutical injection. The patient was not given morphine due to allergy. IMPRESSION: Nonvisualization of the gallbladder which is suspicious for cystic duct obstruction/acute cholecystitis. However, correlate with causes of false-positive studies as morphine could not be given to constrict the sphincter of Oddi due to allergy. These results will be called to the ordering clinician or representative by the Radiologist Assistant, and communication documented in the PACS or zVision Dashboard. Electronically Signed   By: Margarette Canada M.D.   On: 07/22/2019 16:17   Dg Chest Port 1 View  Result Date: 07/21/2019 CLINICAL DATA:  Covid positive.  Short of breath EXAM: PORTABLE CHEST 1 VIEW COMPARISON:  Radiograph 07/11/2019 FINDINGS: PICC line unchanged. Normal cardiac silhouette. No effusion, infiltrate or pneumothorax. Mild LEFT basilar atelectasis. IMPRESSION: 1. No significant change. 2. No evidence pneumonia. 3. Mild LEFT basilar atelectasis. Electronically Signed   By: Suzy Bouchard M.D.   On: 07/21/2019 10:39   Dg Abd Portable 1v  Result Date: 07/21/2019 CLINICAL DATA:  COVID positive pt. Pt c/o abdominal pain EXAM: PORTABLE ABDOMEN - 1 VIEW COMPARISON:  07/11/2019 FINDINGS: Bowel gas pattern is nonobstructed. There is heterogeneous lucency projecting LATERAL to the LEFT chest wall. This could be related to stomach and significant abdominal wall laxity. Is difficult to exclude abscess or loculated extraluminal gas. Consider CT of the abdomen and pelvis as needed. Multiple remote rib fractures. IMPRESSION: 1. Abnormal lucency to the LEFT chest wall consistent with stomach or loculated extraluminal air. Consider CT of the abdomen and pelvis. 2. Multiple remote rib fractures. These results were called by  telephone at the time of interpretation on 07/21/2019 at 10:12 am to provider [facility] , who verbally acknowledged these results. Electronically Signed   By: Nolon Nations M.D.   On: 07/21/2019 10:43   US Abdomen Limited Ruq  Result Date: 07/21/2019 CLINICAL DATA:  68 year old female with abdominal pain and fever. EXAM: ULTRASOUND ABDOMEN LIMITED RIGHT UPPER QUADRANT COMPARISON:  CT of the abdomen pelvis dated 07/21/2019. FINDINGS: Evaluation is very limited due to patient's body habitus and inability of the patient to cooperate with the exam. Gallbladder: There is sludge and stone within the gallbladder. There is no gallbladder wall thickening or pericholecystic fluid. Evaluation of the mortise sign was limited. Common bile duct: Diameter: 3 mm. Liver: There is diffuse increased liver echogenicity most commonly seen in the setting of fatty infiltration. Superimposed inflammation or fibrosis is not excluded. Evaluation of the liver is limited due to  body habitus and ascites. Portal vein is patent on color Doppler imaging with normal direction of blood flow towards the liver. Other: Amon upper abdominal and perihepatic ascites. IMPRESSION: 1. Gallbladder sludge and stones without definite sonographic evidence of acute cholecystitis. Evaluation however is limited on this ultrasound. A hepatobiliary scintigraphy may provide better evaluation of the gallbladder if there is a high clinical concern for acute cholecystitis . 2. Fatty liver. 3. Ascites. Electronically Signed   By: Anner Crete M.D.   On: 07/21/2019 15:42     Kalman Drape, [facility] Surgery Pager [phone] Cristine Polio, & Friday 7:00am - 4:30pm Thursdays 7:00am -11:30am

## 2019-07-23 NOTE — Progress Notes (Signed)
Pt chest pain relieved after taking Tylenol, pt alert and oriented stable.

## 2019-07-23 NOTE — Consult Note (Signed)
Chief Complaint: Patient was seen in consultation today for percutaneous cholecystostomy placement.  Referring Physician(s): Jackson Latino, PA-C  Supervising Physician: Markus Daft  Patient Status: [facility] - In-pt  History of Present Illness: Alice Kemp is a 68 y.o. female with a past medical history significant for anxiety, depression, morbid obesity, hypothyroidism, recent lumbar laminectomy (05/30/19) CKD, DM, gout, diastolic heart failure, HLD, pulmonary HTN, recent severe c.diff infection and recent COVID-19 infection (first tested positive June 2020, negative mid August 2020, positive again end September 2020) who initially presented to [facility] ED on 07/04/19 due to AMS. She was undergoing rehab following lumbar laminectomy at [facility] where she fell on 9/26 but was not seen in the ED at that time, however she later developed AMS, green diarrhea and hypotension for which she was sent to the ED. She was admitted to [facility] due to COVID-19 encephalopathy and acute respiratory failure requiring supplemental O2 where she remained until 07/21/19 when she was transferred to [facility] for surgical consult after she developed persistent nausea, vomiting and leukocytosis with CT scan findings concerning for possible acute cholecystitis. A follow up RUQ [name] was performed on 10/15 which showed gallbladder sludge and stones without definite sonographic evidence of acute cholecystitis. She was evaluated by Dr. Redmond Pulling (general surgery) on 10/16 and a HIDA scan was ordered due to previous imaging being inconclusive for acute cholecystitis. HIDA scan showed nonvisualization of the gallbladder, suspicious for cystic duct obstruction/acute cholecystitis however morphine could not be given to constrict the sphincter of Oddi due to allergy. She was seen again by general surgery earlier this morning and was felt to be a high surgical risk at this time due to significant infections and morbid obesity - IR has been consulted for  possible percutaneous cholecystostomy placement.   Patient seen in IR suite today, she reports some mild abdominal pain and nausea but is otherwise feeling ok. She states she had a few ice chips earlier today but otherwise has no had anything to eat or drink since Thursday. She states understanding of requested procedure and agrees to proceed.   Past Medical History:  Diagnosis Date   Anxiety    Arthritis    Back pain    Benign neoplasm of colon 06/29/2012   Breast abscess 07/2011   left; s/p I&D + abx by gen surg   Cervical radiculopathy at C6    CKD (chronic kidney disease) stage 3, GFR 30-59 ml/min ([place])    follows with renal   Constipation    Depression    Diabetes mellitus type II    Diastolic heart failure ([place]) 09/2013 echo   Gall bladder inflammation    Glaucoma    Gout    Hyperlipidemia    Hypothyroidism    Morbid obesity ([place])    OBSTRUCTIVE SLEEP APNEA 12/23/2010   npsg 2012:  AHI 25/hr, severe desat to 68%.   autoset 2013: optimal pressure 13cm    PERICARDIAL EFFUSION 12/12/2010   Pulmonary hypertension ([place])    Vitamin B12 deficiency    Vitamin D deficiency     Past Surgical History:  Procedure Laterality Date   BREAST BIOPSY  1990   Left   CATARACT EXTRACTION     COLONOSCOPY  06/29/2012   Procedure: COLONOSCOPY;  Surgeon: Ladene Artist, MD,FACG;  Location: [facility];  Service: Endoscopy;  Laterality: N/A;   LUMBAR LAMINECTOMY/DECOMPRESSION MICRODISCECTOMY Left 05/30/2019   Procedure: Left Decompressive Lumbar Laminectomy and Microdiscectomy lumbar three-four;  Surgeon: Kary Kos, MD;  Location:  [facility];  Service: Neurosurgery;  Laterality: Left;   SHOULDER SURGERY  2010   Left   TONSILLECTOMY  1964    Allergies: Morphine and related and Sulfa antibiotics  Medications: Prior to Admission medications   Medication Sig Start Date End Date Taking? Authorizing Provider  allopurinol (ZYLOPRIM) 100 MG tablet TAKE 1 TABLET BY MOUTH  ONCE DAILY Patient taking differently: Take 100 mg by mouth daily.  06/28/18  Yes Burns, Claudina Lick, MD  aspirin 81 MG tablet Take 81 mg by mouth daily. Reported on 12/24/2015   Yes [provider]  bisacodyl (DULCOLAX) 10 MG suppository Place 1 suppository (10 mg total) rectally daily as needed for moderate constipation. 06/05/19  Yes Sheikh, Omair Latif, DO  buPROPion (WELLBUTRIN SR) 200 MG 12 hr tablet Take 1 tablet (200 mg total) by mouth daily. 12/15/18  Yes Abby Potash, PA-C  DULoxetine (CYMBALTA) 30 MG capsule Take 1 capsule (30 mg total) by mouth 2 (two) times daily. 05/11/18  Yes Burns, Claudina Lick, MD  escitalopram (LEXAPRO) 10 MG tablet Take 10 mg by mouth daily.   Yes [provider]  ferrous sulfate 325 (65 FE) MG tablet Take 325 mg by mouth daily with breakfast. 06/07/19 07/19/19 Yes [provider]  fluticasone (CUTIVATE) 0.05 % cream APPLY 3 TIMES A DAY AS NEEDED FOR ITCHING Patient taking differently: Apply 1 application topically 3 (three) times daily as needed (itching).  07/23/12  Yes Renato Shin, MD  gabapentin (NEURONTIN) 300 MG capsule Take 300 mg by mouth 2 (two) times daily.  05/05/16  Yes [provider]  insulin aspart (NOVOLOG) 100 UNIT/ML injection Inject 2-14 Units into the skin See admin instructions. Per Sliding Scale: If Blood Sugar is less than 70, Call MD. If Blood Sugar if 201-250=2u, 251-300=4u, 301-350=6u, 351-400=8u, 401-450=10u, 451-500=12u, if >500=14u AND call MD.   Yes [provider]  insulin glargine (LANTUS) 100 UNIT/ML injection Inject 15 Units into the skin at bedtime.   Yes [provider]  levothyroxine (SYNTHROID, LEVOTHROID) 88 MCG tablet Take 88 mcg by mouth daily before breakfast.   Yes [provider]  loperamide (IMODIUM A-D) 2 MG tablet Take 2-4 mg by mouth See admin instructions. Take 2 tablets to start, then 1 tab after each loose stool. NOT TO EXCEED 4 TABS PER DAY   Yes [provider]  Magnesium 200 MG TABS Take 200 mg by mouth daily.    Yes [provider]  pantoprazole (PROTONIX) 40 MG tablet Take 1 tablet (40 mg total) by mouth at bedtime. 06/05/19  Yes Sheikh, Omair Latif, DO  polyethylene glycol (MIRALAX / GLYCOLAX) 17 g packet Take 17 g by mouth daily as needed. Patient taking differently: Take 17 g by mouth daily as needed for mild constipation.  06/05/19  Yes Sheikh, Omair Latif, DO  rosuvastatin (CRESTOR) 5 MG tablet Take 1 tablet (5 mg total) by mouth daily. 09/24/17  Yes Beasley, Caren D, MD  saccharomyces boulardii (FLORASTOR) 250 MG capsule Take 250 mg by mouth 2 (two) times daily.   Yes [provider]  sodium chloride 0.9 % Inject 2,000 mLs into the vein See admin instructions. Infuse 1st liter @75CC /HR, then Infuse 2nd liter @40CC /HR every shift   Yes [provider]  vitamin B-12 (CYANOCOBALAMIN) 500 MCG tablet Take 500 mcg by mouth every Monday, Wednesday, and Friday.    Yes [provider]  benzonatate (TESSALON) 100 MG capsule Take 1 capsule (100 mg total) by mouth 3 (three)  times daily as needed for cough. Patient not taking: Reported on 07/04/2019 04/01/19   Hoyt Koch, MD  celecoxib (CELEBREX) 100 MG capsule Take 1 capsule (100 mg total) by mouth daily as needed. Patient not taking: Reported on 07/04/2019 05/19/19   [name], MD  colchicine 0.6 MG tablet TAKE 2 TABLETS BY MOUTH AT ONSET OF FLARE THEN TAKE 1 TABLET 1 HOUR LATER Patient not taking: TAKE 2 TABLETS BY MOUTH AT ONSET OF FLARE THEN TAKE 1 TABLET 1 HOUR LATER 06/28/18   Burns, Claudina Lick, MD  furosemide (LASIX) 80 MG tablet Take 1 tablet (80 mg total) by mouth daily. Patient not taking: Reported on 07/04/2019 05/28/15   Rowe Clack, MD  losartan (COZAAR) 25 MG tablet Take 1 tablet (25 mg total) by mouth daily. -- Office visit needed for further refills Patient not taking: Reported on 07/04/2019 11/12/17   [name], MD  meclizine  (ANTIVERT) 25 MG tablet Take 0.5-1 tablets (12.5-25 mg total) by mouth 3 (three) times daily as needed for dizziness. Patient not taking: Reported on 07/04/2019 01/05/19   [name], MD  methocarbamol (ROBAXIN) 500 MG tablet Take 1 tablet (500 mg total) by mouth every 8 (eight) hours as needed for muscle spasms. Patient not taking: Reported on 07/04/2019 06/05/19   Raiford Noble Latif, DO  ondansetron (ZOFRAN) 4 MG tablet Take 1 tablet (4 mg total) by mouth every 6 (six) hours as needed for nausea or vomiting. Patient not taking: Reported on 07/04/2019 06/05/19   Raiford Noble McCausland, DO  [facility] VERIO test strip Check BS twice daily 12/15/18   Abby Potash, PA-C  oxyCODONE 10 MG TABS Take 1 tablet (10 mg total) by mouth every 6 (six) hours as needed for severe pain. Patient not taking: Reported on 07/04/2019 06/05/19   Raiford Noble Latif, DO  senna-docusate (SENOKOT-S) 8.6-50 MG tablet Take 1 tablet by mouth 2 (two) times daily. Patient not taking: Reported on 07/04/2019 06/05/19   Raiford Noble Latif, DO  spironolactone (ALDACTONE) 25 MG tablet TAKE 1 TABLET (25 MG TOTAL) BY MOUTH DAILY. Patient not taking: Reported on 07/04/2019 06/28/18   [name], MD     Family History  Problem Relation Age of Onset   Hypertension Mother    Heart disease Mother    Heart attack Mother    Thyroid disease Mother    Diabetes Father    Heart disease Father    Skin cancer Father    Cancer Father        skin   Heart attack Father    Hyperlipidemia Father    Colon polyps Father    Depression Father    Hypertension Brother    Skin cancer Paternal Grandfather    Colon cancer Paternal Grandfather 94   Breast cancer Paternal Aunt    Cancer Paternal Aunt        breast   Diabetes Paternal Uncle    Diabetes Maternal Grandfather    Sudden death Neg Hx     Social History   Socioeconomic History   Marital status: Single    Spouse name: Not on file   Number of children: 0    Years of education: Not on file   Highest education level: Not on file  Occupational History   Occupation: Retired TEFL teacher: [place]: At [employer] resource strain: Somewhat hard   Food insecurity  Worry: Never true    Inability: Never true   Transportation needs    Medical: No    Non-medical: No  Tobacco Use   Smoking status: Never Smoker   Smokeless tobacco: Never Used  Substance and Sexual Activity   Alcohol use: No    Alcohol/week: 0.0 standard drinks    Frequency: Never   Drug use: No   Sexual activity: Not on file  Lifestyle   Physical activity    Days per week: 0 days    Minutes per session: 0 min   Stress: Only a little  Relationships   Social connections    Talks on phone: Patient refused    Gets together: Patient refused    Attends religious service: Patient refused    Active member of club or organization: Patient refused    Attends meetings of clubs or organizations: Patient refused    Relationship status: Patient refused  Other Topics Concern   Not on file  Social History Narrative   Lives alone.        Review of Systems: A 12 point ROS discussed and pertinent positives are indicated in the HPI above.  All other systems are negative.  Review of Systems  Constitutional: Negative for chills and fever.  HENT: Negative for nosebleeds.   Respiratory: Negative for cough and shortness of breath.   Cardiovascular: Negative for chest pain.  Gastrointestinal: Positive for abdominal pain and nausea. Negative for blood in stool, diarrhea and vomiting.  Genitourinary: Negative for hematuria.  Musculoskeletal: Negative for back pain.  Skin: Negative for rash and wound.  Neurological: Negative for dizziness and headaches.    Vital Signs: BP 134/81 (BP Location: Left Arm)    Pulse 91    Temp 97.8 F (36.6 C) (Oral)    Resp 18    Ht 5' 4.02" (1.626 m)    Wt 279 lb  15.8 oz (127 kg)    SpO2 96%    BMI 48.04 kg/m   Physical Exam Vitals signs and nursing note reviewed.  Constitutional:      General: She is not in acute distress.    Appearance: She is obese.  HENT:     Head: Normocephalic.     Mouth/Throat:     Mouth: Mucous membranes are moist.     Pharynx: Oropharynx is clear. No oropharyngeal exudate or posterior oropharyngeal erythema.  Eyes:     General: No scleral icterus. Cardiovascular:     Rate and Rhythm: Normal rate and regular rhythm.  Pulmonary:     Effort: Pulmonary effort is normal.     Breath sounds: Normal breath sounds.  Abdominal:     General: Bowel sounds are normal. There is no distension.     Palpations: Abdomen is soft.     Tenderness: There is abdominal tenderness (RUQ).  Skin:    General: Skin is warm and dry.     Coloration: Skin is not jaundiced.  Neurological:     Mental Status: She is alert and oriented to person, place, and time.  Psychiatric:        Mood and Affect: Mood normal.        Behavior: Behavior normal.        Thought Content: Thought content normal.        Judgment: Judgment normal.      MD Evaluation Airway: WNL Heart: WNL Abdomen: WNL Chest/ Lungs: WNL ASA  Classification: 3 Mallampati/Airway Score: Two   Imaging: Ct Abdomen Pelvis Wo Contrast  Result  Date: 07/21/2019 CLINICAL DATA:  Abdominal pain and fever.  Evaluate for an abscess. EXAM: CT ABDOMEN AND PELVIS WITHOUT CONTRAST TECHNIQUE: Multidetector CT imaging of the abdomen and pelvis was performed following the standard protocol without IV contrast. COMPARISON:  Chest CT 12/05/2010 FINDINGS: Lower chest: Bilateral pleural effusions, right side greater than left. Right pleural effusion may be moderate in size. Again noted is a pericardial effusion and minimally changed from the prior chest CT examination. Hepatobiliary: Gallbladder is distended with high-density material suggestive for stones or sludge. Gallbladder measures up to 10  cm in largest dimension. [place] to moderate amount of perihepatic ascites. Limited evaluation of the liver on this noncontrast examination. No significant extrahepatic biliary dilatation. Pancreas: Unremarkable. No pancreatic ductal dilatation or surrounding inflammatory changes. Spleen: Normal appearance of the spleen without enlargement. Cannot exclude trace perisplenic ascites. Adrenals/Urinary Tract: Normal appearance of the adrenal glands. No definite kidney stones. No hydronephrosis. Urinary bladder is decompressed with a Foley catheter. Stomach/Bowel: Normal appearance of the stomach and duodenum. Diverticula involving the sigmoid colon. No evidence for bowel obstruction or focal bowel inflammation. Vascular/Lymphatic: Atherosclerotic calcifications involving the aorta and visceral arteries. Negative for an abdominal aortic aneurysm. Mildly prominent lymph nodes in the pelvis. Index lymph node measures 1.2 cm in the short axis along the right iliac chain on sequence 2, image 72. Mildly prominent lymph nodes are nonspecific. Reproductive: Uterus and bilateral adnexa are poorly characterized on this examination but appear to be unremarkable. Other: Severe subcutaneous edema. Perihepatic ascites. Hansson amount of ascites in the right paracolic gutter. Limberg amount of ascites in the pelvis. Negative for free intraperitoneal air. Musculoskeletal: Multilevel disc space disease. Disc disease is most prominent at L4-L5 and T12-L1. IMPRESSION: 1. Distended gallbladder with high-density material that could represent stones or sludge. In addition, there is perihepatic ascites. These findings are concerning for acute cholecystitis. Consider further characterization with a right upper quadrant ultrasound. 2. Bilateral pleural effusions, right side greater than left. Severe subcutaneous edema. Findings could represent fluid overload state. 3. Persistent pericardial effusion. Minimal change since exam in 2012. 4. Borderline  lymphadenopathy in the pelvis. Pelvic lymph nodes are nonspecific but could be reactive. Electronically Signed   By: Markus Daft M.D.   On: 07/21/2019 13:09   Ct Head Wo Contrast  Result Date: 07/04/2019 CLINICAL DATA:  Mental status. Fall 2 days ago. Positive COVID-25 May 2019. EXAM: CT HEAD WITHOUT CONTRAST TECHNIQUE: Contiguous axial images were obtained from the base of the skull through the vertex without intravenous contrast. COMPARISON:  None. FINDINGS: Brain: Ventricles, cisterns and other CSF spaces are normal. There is mild chronic ischemic microvascular disease present. There is no mass, mass effect, shift of midline structures or acute hemorrhage. No evidence of acute infarction. Vascular: No hyperdense vessel or unexpected calcification. Skull: Normal. Negative for fracture or focal lesion. Sinuses/Orbits: No acute finding. Other: None. IMPRESSION: No acute findings. Mild chronic ischemic microvascular disease. Electronically Signed   By: Marin Olp M.D.   On: 07/04/2019 07:11   Nm Hepatobiliary Liver Func  Result Date: 07/22/2019 CLINICAL DATA:  68 year old female with acute abdominal pain. EXAM: NUCLEAR MEDICINE HEPATOBILIARY IMAGING TECHNIQUE: Sequential images of the abdomen were obtained out to 60 minutes following intravenous administration of radiopharmaceutical. RADIOPHARMACEUTICALS:  4.9 mCi Tc-45m  Choletec IV COMPARISON:  07/21/2019 ultrasound and CT FINDINGS: Prompt uptake and biliary excretion of activity by the liver is seen. Biliary activity passes into [place] bowel, consistent with patent common bile duct. Gallbladder activity is not visualized  after 2 hours post radiopharmaceutical injection. The patient was not given morphine due to allergy. IMPRESSION: Nonvisualization of the gallbladder which is suspicious for cystic duct obstruction/acute cholecystitis. However, correlate with causes of false-positive studies as morphine could not be given to constrict the sphincter of  Oddi due to allergy. These results will be called to the ordering clinician or representative by the Radiologist Assistant, and communication documented in the PACS or zVision Dashboard. Electronically Signed   By: Margarette Canada M.D.   On: 07/22/2019 16:17   Dg Chest Port 1 View  Result Date: 07/21/2019 CLINICAL DATA:  Covid positive.  Short of breath EXAM: PORTABLE CHEST 1 VIEW COMPARISON:  Radiograph 07/11/2019 FINDINGS: PICC line unchanged. Normal cardiac silhouette. No effusion, infiltrate or pneumothorax. Mild LEFT basilar atelectasis. IMPRESSION: 1. No significant change. 2. No evidence pneumonia. 3. Mild LEFT basilar atelectasis. Electronically Signed   By: Suzy Bouchard M.D.   On: 07/21/2019 10:39   Dg Chest Port 1 View  Result Date: 07/11/2019 CLINICAL DATA:  Evaluate PICC tip. EXAM: PORTABLE CHEST 1 VIEW COMPARISON:  Chest x-ray from same day. FINDINGS: Interval retraction of the right upper extremity PICC line with the tip now in the distal SVC. Stable cardiomediastinal silhouette. Normal pulmonary vascularity. Continued low lung volumes with mild bibasilar atelectasis, slightly improved. No focal consolidation, pleural effusion, or pneumothorax. No acute osseous abnormality. IMPRESSION: 1. Right upper extremity PICC line tip now in the distal SVC. 2. Continued low lung volumes with mildly improved bibasilar atelectasis. Electronically Signed   By: Titus Dubin M.D.   On: 07/11/2019 18:41   Dg Chest Port 1 View  Result Date: 07/11/2019 CLINICAL DATA:  PICC placement EXAM: PORTABLE CHEST 1 VIEW COMPARISON:  07/04/2019 FINDINGS: Right arm PICC is been placed. Tip in the mid right atrium. Recommend withdrawal of 3 cm for optimum position Hypoventilation with bibasilar atelectasis.  No effusion or edema. IMPRESSION: PICC tip in the mid right atrium.  Recommend withdrawal 3 cm Hypoventilation with bibasilar atelectasis. Electronically Signed   By: Franchot Gallo M.D.   On: 07/11/2019 17:30    Dg Chest Port 1 View  Result Date: 07/04/2019 CLINICAL DATA:  Altered mental status and hypotension EXAM: PORTABLE CHEST 1 VIEW COMPARISON:  06/03/2019 FINDINGS: Mild cardiomegaly accentuated by low volumes. Otherwise normal mediastinal contours. There is no edema, consolidation, effusion, or pneumothorax. Postoperative proximal left humerus. IMPRESSION: No evidence of acute disease. Electronically Signed   By: Monte Fantasia M.D.   On: 07/04/2019 05:26   Dg Abd Portable 1v  Result Date: 07/21/2019 CLINICAL DATA:  COVID positive pt. Pt c/o abdominal pain EXAM: PORTABLE ABDOMEN - 1 VIEW COMPARISON:  07/11/2019 FINDINGS: Bowel gas pattern is nonobstructed. There is heterogeneous lucency projecting LATERAL to the LEFT chest wall. This could be related to stomach and significant abdominal wall laxity. Is difficult to exclude abscess or loculated extraluminal gas. Consider CT of the abdomen and pelvis as needed. Multiple remote rib fractures. IMPRESSION: 1. Abnormal lucency to the LEFT chest wall consistent with stomach or loculated extraluminal air. Consider CT of the abdomen and pelvis. 2. Multiple remote rib fractures. These results were called by telephone at the time of interpretation on 07/21/2019 at 10:12 am to provider [facility] , who verbally acknowledged these results. Electronically Signed   By: Nolon Nations M.D.   On: 07/21/2019 10:43   Dg Abd Portable 1v  Result Date: 07/11/2019 CLINICAL DATA:  Diarrhea and C diff EXAM: PORTABLE ABDOMEN - 1 VIEW COMPARISON:  None. FINDINGS: Gas-filled loops of colon within normal limits. No dilated loops of Kueker bowel. Gas the rectum. IMPRESSION: Gas-filled colon. No evidence obstruction or inflammation by plain film imaging Electronically Signed   By: Suzy Bouchard M.D.   On: 07/11/2019 11:32   [name] Ekg Site Rite  Result Date: 07/11/2019 If Site Rite image not attached, placement could not be confirmed due to current cardiac rhythm.  US  Abdomen Limited Ruq  Result Date: 07/21/2019 CLINICAL DATA:  68 year old female with abdominal pain and fever. EXAM: ULTRASOUND ABDOMEN LIMITED RIGHT UPPER QUADRANT COMPARISON:  CT of the abdomen pelvis dated 07/21/2019. FINDINGS: Evaluation is very limited due to patient's body habitus and inability of the patient to cooperate with the exam. Gallbladder: There is sludge and stone within the gallbladder. There is no gallbladder wall thickening or pericholecystic fluid. Evaluation of the mortise sign was limited. Common bile duct: Diameter: 3 mm. Liver: There is diffuse increased liver echogenicity most commonly seen in the setting of fatty infiltration. Superimposed inflammation or fibrosis is not excluded. Evaluation of the liver is limited due to body habitus and ascites. Portal vein is patent on color Doppler imaging with normal direction of blood flow towards the liver. Other: Derringer upper abdominal and perihepatic ascites. IMPRESSION: 1. Gallbladder sludge and stones without definite sonographic evidence of acute cholecystitis. Evaluation however is limited on this ultrasound. A hepatobiliary scintigraphy may provide better evaluation of the gallbladder if there is a high clinical concern for acute cholecystitis . 2. Fatty liver. 3. Ascites. Electronically Signed   By: Anner Crete M.D.   On: 07/21/2019 15:42    Labs:  CBC: Recent Labs    07/19/19 0615 07/20/19 0445 07/21/19 0355 07/23/19 0359  WBC 6.4 11.8* 20.1* 16.1*  HGB 8.4* 8.4* 8.9* 8.1*  HCT 28.0* 27.9* 29.6* 25.9*  PLT 284 305 339 375    COAGS: No results for input(s): INR, APTT in the last 8760 hours.  BMP: Recent Labs    07/19/19 0615 07/20/19 0445 07/21/19 0355 07/23/19 0359  NA 138 138 134* 137  K 3.8 4.3 4.2 4.3  CL 106 108 102 107  CO2 26 25 25 22   GLUCOSE 74 104* 99 128*  BUN 17 13 13 20   CALCIUM 7.3* 7.2* 7.3* 7.1*  CREATININE 0.87 0.85 1.09* 1.67*  GFRNONAA >60 >60 52* 31*  GFRAA >60 >60 60* 36*     LIVER FUNCTION TESTS: Recent Labs    07/19/19 0615 07/20/19 0445 07/21/19 0355 07/23/19 0359  BILITOT 0.6 0.2* 0.5 0.5  AST 31 26 24 17   ALT 18 18 17 13   ALKPHOS 227* 202* 188* 159*  PROT 4.7* 4.6* 4.8* 4.5*  ALBUMIN 1.3* 1.3* 1.3* 1.1*    TUMOR MARKERS: No results for input(s): AFPTM, CEA, CA199, CHROMGRNA in the last 8760 hours.  Assessment and Plan:  68 y/o F with recent c.diff infection, recent COVID-19 infection who has been transferred from [facility] due to concern for acute cholecystitis. HIDA scan performed yesterday showed nonvisualization of the gallbladder, suspicious for cystic duct obstruction/acute cholecystitis. She has been evaluated by general surgery and is felt to be a high risk candidate for cholecystectomy at this time - IR has been consulted for percutaneous cholecystostomy placement. Patient reviewed by Dr. Anselm Pancoast today who agrees to procedure with possible paracentesis prior to procedure given possible [place] amount of ascites on recent imaging.  Patient has been NPO since 10/15, last dose of Lovenox yesterday at 1200. Afebrile, WBC 16.1, hgb 8.1, plt  375, creatinine 1.67, t.bili 0.5, ALP 159, INR 1.4.  Risks and benefits discussed with the patient including, but not limited to bleeding, infection, gallbladder perforation, bile leak, sepsis or even death.   All of the patient's questions were answered, patient is agreeable to proceed.  Consent signed and in chart.  Thank you for this interesting consult.  I greatly enjoyed meeting Alice Kemp and look forward to participating in their care.  A copy of this report was sent to the requesting provider on this date.  Electronically Signed: Joaquim Nam, PA-C 07/23/2019, 12:05 PM   I spent a total of 20 Minutes in face to face in clinical consultation, greater than 50% of which was counseling/coordinating care for percutaneous cholecystostomy placement.

## 2019-07-23 NOTE — Sedation Documentation (Signed)
300cc clear yellow fluid drain from paracentesis.

## 2019-07-23 NOTE — Procedures (Signed)
Interventional Radiology Procedure:   Indications: Acute cholecystitis and not surgery candidate at this time.  Procedure: Percutaneous cholecystostomy and paracentesis  Findings: Perihepatic ascites and distended gallbladder.  Paracentesis yielded 300 ml of yellow fluid.  Removed greater than 150 ml of dark cloudy bile from gallbladder and placed 10 Fr drain.  Complications: None     EBL: less than 10 ml  Plan: Send bile and ascites for culture.  Follow output.     Trellis Guirguis R. Anselm Pancoast, MD  Pager: [phone]

## 2019-07-23 NOTE — Progress Notes (Addendum)
PROGRESS NOTE  Alice Kemp [id number] DOB: [birth year] DOA: 07/04/2019 PCP: [name], MD  Brief Narrative: Alice Kemp is a 68 year old female with medical history significant for hypothyroidism obstructive sleep apnea morbid obesity diastolic dysfunction CHF stage III chronic kidney disease who presented with a fall.  She also was treated for COVID-19 infection in September she does not have any more COVID symptoms at this time she is being considered for and treated for cholecystitis that was equivocal on ultrasound but a HIDA scan was done today and it does confirm cholecystitis she is currently on antibiotics for C. difficile she was transferred from another hospital yesterday for further care at [facility]   Interval history/Subjective: Patient seen and examined at bedside she still complaining a lot of nausea but no vomiting and no abdominal pain.  Her stool is still slightly loose but better.  She has not been able to take her oral vancomycin because of the nausea.  She underwent HIDA scan today  Update: July 23, 2019. Patient seen and examined at bedside she stated she feels much better the nausea has reduced and she was able to take her medication today.  She had chest pain earlier it was epigastric like reflux or gas but that has resolved but she is complaining of pain in the skin folds of her abdomen where she has ulcer or ulceration  Principal Problem:   Encephalopathy due to COVID-19 virus Active Problems:   Uncontrolled type 2 diabetes mellitus with stage 3 chronic kidney disease (HCC)   Morbid obesity (HCC)   Hypothyroidism   Depression   CKD (chronic kidney disease), stage III (HCC)   Chronic diastolic heart failure (HCC)   Essential hypertension   AKI (acute kidney injury) ([place])   AMS (altered mental status)   Pressure injury of skin  Assessment/Plan #1 acute cholecystitis.  HIDA scan reports reviewed.  Patient underwent  Percutaneous cholecystostomy  and paracentesis today Continue IV antibiotics with Unasyn infectious disease and surgery are on board  2.  Generalized lower extremity weakness of 6 to 8 months duration due to her most recent COVID infection likely.  Continue physical therapy.  3.  COVID infection.  Patient is asymptomatic and she has been treated will discontinue droplets precaution  4.  C. difficile colitis her diarrhea is improving but she is not able to continue oral or take oral vancomycin consistently because of her nausea  5.  Persistent nausea continue symptomatic treatment  6.  Chronic diastolic heart failure she has lower extremity edema but not severely  7.  Morbid obesity will benefit from weight reduction management as outpatient  8.  Skin ulcers in the abdominal fold area looks dry wound care nurse has been consulted continue current management per wound care nurse  DVT prophylaxis: Aspirin Code Status: Full Family Communication: None at bedside Disposition Plan: To be determined Time spent over 30 minutes   Dr. Kyung Bacca [facility] Pager [phone]  07/23/2019, 6:10 PM  LOS: 19 days   Consultants:  Infectious disease  Procedures:  HIDA scan  Percutaneous cholecystostomy and paracentesis 07/23/2019 Antimicrobials:  Unasyn IV   Objective: Vitals:  Vitals:   07/23/19 1640 07/23/19 1645  BP: 114/77 112/64  Pulse: 98 98  Resp: (!) 0 (!) 8  Temp:    SpO2: 100% 100%    Exam:  Constitutional:  . Appears calm and comfortable morbidly obese Eyes:  . pupils and irises appear normal . Normal lids and conjunctivae ENMT:  .  grossly normal hearing  . Lips appear normal . external ears, nose appear normal . Oropharynx: mucosa, tongue,posterior pharynx appear normal Neck:  . neck appears normal, no masses, normal ROM, supple . no thyromegaly Respiratory:  . CTA bilaterally, no w/r/r.  . Respiratory effort normal. No retractions or accessory muscle use Cardiovascular:  . RRR, no  m/r/g . Trace LE extremity edema   . Normal pedal pulses Abdomen:  . Abdomen appears normal; no tenderness or masses . No hernias . No HSM Musculoskeletal:  . Digits/nails BUE: no clubbing, cyanosis, petechiae, infection . exam of joints, bones, muscles of at least one of following: head/neck, RUE, LUE, RLE, LLE   o strength and tone normal, no atrophy, no abnormal movements o No tenderness, masses o Normal ROM, no contractures  .  Skin:  . Intertrigo of abdominal skin fold the area is dried there is no drainage but there is crustiness . palpation of skin: no induration or nodules Neurologic:  . CN 2-12 intact .  Psychiatric:  . Mental status o Mood, affect appropriate o Orientation to person, place, time  . judgment and insight appear intact     I have personally reviewed the following:   Data:Study Result  CLINICAL DATA:  68 year old female with acute abdominal pain.  EXAM: NUCLEAR MEDICINE HEPATOBILIARY IMAGING  TECHNIQUE: Sequential images of the abdomen were obtained out to 60 minutes following intravenous administration of radiopharmaceutical.  RADIOPHARMACEUTICALS:  4.9 mCi Tc-67m  Choletec IV  COMPARISON:  07/21/2019 ultrasound and CT  FINDINGS: Prompt uptake and biliary excretion of activity by the liver is seen. Biliary activity passes into Mallinger bowel, consistent with patent common bile duct.  Gallbladder activity is not visualized after 2 hours post radiopharmaceutical injection.  The patient was not given morphine due to allergy.  IMPRESSION: Nonvisualization of the gallbladder which is suspicious for cystic duct obstruction/acute cholecystitis. However, correlate with causes of false-positive studies as morphine could not be given to constrict the sphincter of Oddi due to allergy.  These results will be called to the ordering clinician or representative by the Radiologist Assistant, and communication documented in the PACS or  zVision Dashboard.      Scheduled Meds: . aspirin  81 mg Oral Daily  . buPROPion  100 mg Oral BID  . Chlorhexidine Gluconate Cloth  6 each Topical Daily  . DULoxetine  30 mg Oral BID  . enoxaparin (LOVENOX) injection  60 mg Subcutaneous Q24H  . feeding supplement (ENSURE ENLIVE)  237 mL Oral TID BM  . feeding supplement (PRO-STAT SUGAR FREE 64)  30 mL Oral TID WC  . fidaxomicin  200 mg Oral BID  . gabapentin  300 mg Oral TID  . Gerhardt's butt cream  1 application Topical TID  . insulin aspart  0-15 Units Subcutaneous Q6H  . levothyroxine  88 mcg Oral QAC breakfast  . lidocaine      . midodrine  10 mg Oral BID WC  . multivitamin with minerals  1 tablet Oral Daily  . rosuvastatin  5 mg Oral Daily  . sodium chloride flush  10-40 mL Intracatheter Q12H  . sucralfate  1 g Oral TID WC & HS   Continuous Infusions: . sodium chloride 75 mL/hr at 07/23/19 0858  . ampicillin-sulbactam (UNASYN) IV 3 g (07/23/19 0857)  . metronidazole 500 mg (07/23/19 1418)    Principal Problem:   Encephalopathy due to COVID-19 virus Active Problems:   Uncontrolled type 2 diabetes mellitus with stage 3 chronic kidney disease (  [place])   Morbid obesity ([place])   Hypothyroidism   Depression   CKD (chronic kidney disease), stage III (HCC)   Chronic diastolic heart failure (HCC)   Essential hypertension   AKI (acute kidney injury) ([place])   AMS (altered mental status)   Healing pressure injury, stage 2 (HCC)   Acalculous cholecystitis   Lower abdominal pain, unspecified   Leukocytosis   Clostridium difficile diarrhea   Anasarca   LOS: 19 days

## 2019-07-24 DIAGNOSIS — A0472 Enterocolitis due to Clostridium difficile, not specified as recurrent: Principal | ICD-10-CM

## 2019-07-24 DIAGNOSIS — U071 COVID-19: Secondary | ICD-10-CM | POA: Diagnosis not present

## 2019-07-24 DIAGNOSIS — K819 Cholecystitis, unspecified: Secondary | ICD-10-CM | POA: Diagnosis not present

## 2019-07-24 DIAGNOSIS — G9349 Other encephalopathy: Secondary | ICD-10-CM | POA: Diagnosis not present

## 2019-07-24 DIAGNOSIS — D72829 Elevated white blood cell count, unspecified: Secondary | ICD-10-CM

## 2019-07-24 DIAGNOSIS — Z9049 Acquired absence of other specified parts of digestive tract: Secondary | ICD-10-CM

## 2019-07-24 DIAGNOSIS — Z978 Presence of other specified devices: Secondary | ICD-10-CM

## 2019-07-24 DIAGNOSIS — F329 Major depressive disorder, single episode, unspecified: Secondary | ICD-10-CM

## 2019-07-24 DIAGNOSIS — N183 Chronic kidney disease, stage 3 unspecified: Secondary | ICD-10-CM

## 2019-07-24 DIAGNOSIS — Z8619 Personal history of other infectious and parasitic diseases: Secondary | ICD-10-CM

## 2019-07-24 LAB — CBC WITH DIFFERENTIAL/PLATELET
Abs Immature Granulocytes: 0 10*3/uL (ref 0.00–0.07)
Basophils Absolute: 0 10*3/uL (ref 0.0–0.1)
Basophils Relative: 0 %
Eosinophils Absolute: 0.2 10*3/uL (ref 0.0–0.5)
Eosinophils Relative: 2 %
HCT: 26.8 % — ABNORMAL LOW (ref 36.0–46.0)
Hemoglobin: 8 g/dL — ABNORMAL LOW (ref 12.0–15.0)
Lymphocytes Relative: 10 %
Lymphs Abs: 1.1 10*3/uL (ref 0.7–4.0)
MCH: 27.9 pg (ref 26.0–34.0)
MCHC: 29.9 g/dL — ABNORMAL LOW (ref 30.0–36.0)
MCV: 93.4 fL (ref 80.0–100.0)
Monocytes Absolute: 0.2 10*3/uL (ref 0.1–1.0)
Monocytes Relative: 2 %
Neutro Abs: 9 10*3/uL — ABNORMAL HIGH (ref 1.7–7.7)
Neutrophils Relative %: 86 %
Platelets: 351 10*3/uL (ref 150–400)
RBC: 2.87 MIL/uL — ABNORMAL LOW (ref 3.87–5.11)
RDW: 21.2 % — ABNORMAL HIGH (ref 11.5–15.5)
WBC: 10.5 10*3/uL (ref 4.0–10.5)
nRBC: 0 % (ref 0.0–0.2)
nRBC: 1 /100 WBC — ABNORMAL HIGH

## 2019-07-24 LAB — COMPREHENSIVE METABOLIC PANEL
ALT: 12 U/L (ref 0–44)
AST: 17 U/L (ref 15–41)
Albumin: 1.1 g/dL — ABNORMAL LOW (ref 3.5–5.0)
Alkaline Phosphatase: 140 U/L — ABNORMAL HIGH (ref 38–126)
Anion gap: 7 (ref 5–15)
BUN: 19 mg/dL (ref 8–23)
CO2: 24 mmol/L (ref 22–32)
Calcium: 7 mg/dL — ABNORMAL LOW (ref 8.9–10.3)
Chloride: 107 mmol/L (ref 98–111)
Creatinine, Ser: 1.4 mg/dL — ABNORMAL HIGH (ref 0.44–1.00)
GFR calc Af Amer: 44 mL/min — ABNORMAL LOW (ref >60)
GFR calc non Af Amer: 38 mL/min — ABNORMAL LOW (ref >60)
Glucose, Bld: 105 mg/dL — ABNORMAL HIGH (ref 70–99)
Potassium: 4 mmol/L (ref 3.5–5.1)
Sodium: 138 mmol/L (ref 135–145)
Total Bilirubin: 0.8 mg/dL (ref 0.3–1.2)
Total Protein: 4.7 g/dL — ABNORMAL LOW (ref 6.5–8.1)

## 2019-07-24 LAB — GRAM STAIN

## 2019-07-24 LAB — GLUCOSE, CAPILLARY
Glucose-Capillary: 99 mg/dL (ref 70–99)
Glucose-Capillary: 100 mg/dL — ABNORMAL HIGH (ref 70–99)
Glucose-Capillary: 93 mg/dL (ref 70–99)
Glucose-Capillary: 132 mg/dL — ABNORMAL HIGH (ref 70–99)
Glucose-Capillary: 115 mg/dL — ABNORMAL HIGH (ref 70–99)

## 2019-07-24 MED ORDER — FUROSEMIDE 10 MG/ML IJ SOLN
40.0000 mg | Freq: Once | INTRAMUSCULAR | Status: AC
  Administered 2019-07-24: 13:00:00 40 mg via INTRAVENOUS
  Filled 2019-07-24: qty 4

## 2019-07-24 NOTE — Progress Notes (Signed)
[facility] for Infectious Disease   Reason for visit: Follow up on C. Diff colitis/possible cholecystitis  Interval History: Pt has 3 loose bowel movements today, so rectal tube now placed.  She remains on liquid diet which she has been tolerating better since having a percutaneous cholecystostomy drain placed yesterday.  She endorses significant improvement in her nausea and vomiting since the drain has been placed.  She has had no ambulation in the last 1 week or more per her report.  Antibiotics: Unasyn, day 4 PO fidoxamicin, day 2 IV flagyl, day 11 + 2  Fever curve, WBC & Cr trends, imaging, cx results, and ABX usage all independently reviewed    Current Facility-Administered Medications:    acetaminophen (TYLENOL) tablet 650 mg, 650 mg, Oral, Q6H PRN, Thurnell Lose, MD, 650 mg at 07/24/19 1005   alum & mag hydroxide-simeth (MAALOX/MYLANTA) 200-200-20 MG/5ML suspension 30 mL, 30 mL, Oral, Q6H PRN, Cristal Deer, MD, 30 mL at 07/23/19 1954   Ampicillin-Sulbactam (UNASYN) 3 g in sodium chloride 0.9 % 100 mL IVPB, 3 g, Intravenous, Q6H, Thurnell Lose, MD, Last Rate: 200 mL/hr at 07/24/19 1004, 3 g at 07/24/19 1004   aspirin chewable tablet 81 mg, 81 mg, Oral, Daily, Thurnell Lose, MD, 81 mg at 07/24/19 1006   buPROPion (WELLBUTRIN SR) 12 hr tablet 100 mg, 100 mg, Oral, BID, Thurnell Lose, MD, 100 mg at 07/24/19 1005   Chlorhexidine Gluconate Cloth 2 % PADS 6 each, 6 each, Topical, Daily, Thurnell Lose, MD, 6 each at 07/24/19 1006   DULoxetine (CYMBALTA) DR capsule 30 mg, 30 mg, Oral, BID, Thurnell Lose, MD, 30 mg at 07/24/19 1006   enoxaparin (LOVENOX) injection 60 mg, 60 mg, Subcutaneous, Q24H, Watterson, Shannon A, PA-C, 60 mg at 07/24/19 1255   feeding supplement (ENSURE ENLIVE) (ENSURE ENLIVE) liquid 237 mL, 237 mL, Oral, TID BM, Candiss Norse, Prashant K, MD, 237 mL at 07/23/19 0900   feeding supplement (PRO-STAT SUGAR FREE 64) liquid 30 mL, 30 mL,  Oral, TID WC, Lala Lund K, MD, 30 mL at 07/24/19 1255   fidaxomicin (DIFICID) tablet 200 mg, 200 mg, Oral, BID, Carlyle Basques, MD, 200 mg at 07/24/19 1007   gabapentin (NEURONTIN) capsule 300 mg, 300 mg, Oral, TID, Lala Lund K, MD, 300 mg at 07/24/19 1005   Gerhardt's butt cream 1 application, 1 application, Topical, TID, Thurnell Lose, MD, 1 application at [id number]   insulin aspart (novoLOG) injection 0-15 Units, 0-15 Units, Subcutaneous, Q6H, Thurnell Lose, MD, 2 Units at 07/23/19 0059   levothyroxine (SYNTHROID) tablet 88 mcg, 88 mcg, Oral, QAC breakfast, Thurnell Lose, MD, 88 mcg at 07/24/19 [id number]   magic mouthwash w/lidocaine, 10 mL, Oral, TID PRN, Thurnell Lose, MD, 10 mL at 07/24/19 1009   metroNIDAZOLE (FLAGYL) IVPB 500 mg, 500 mg, Intravenous, Q8H, Thurnell Lose, MD, Last Rate: 100 mL/hr at 07/24/19 1257, 500 mg at 07/24/19 1257   midodrine (PROAMATINE) tablet 10 mg, 10 mg, Oral, BID WC, Thurnell Lose, MD, 10 mg at 07/24/19 [id number]   multivitamin with minerals tablet 1 tablet, 1 tablet, Oral, Daily, Thurnell Lose, MD, 1 tablet at 07/24/19 1005   [DISCONTINUED] ondansetron (ZOFRAN) tablet 4 mg, 4 mg, Oral, Q6H PRN **OR** ondansetron (ZOFRAN) injection 4 mg, 4 mg, Intravenous, Q6H PRN, Thurnell Lose, MD, 4 mg at 07/22/19 1117   phenol (CHLORASEPTIC) mouth spray 1 spray, 1 spray, Mouth/Throat, PRN, Thurnell Lose, MD  promethazine (PHENERGAN) injection 12.5 mg, 12.5 mg, Intravenous, Q6H PRN, Thurnell Lose, MD, 12.5 mg at 07/22/19 1624   rosuvastatin (CRESTOR) tablet 5 mg, 5 mg, Oral, Daily, Thurnell Lose, MD, 5 mg at 07/24/19 1005   sodium chloride flush (NS) 0.9 % injection 10-40 mL, 10-40 mL, Intracatheter, Q12H, Thurnell Lose, MD, 10 mL at 07/24/19 1008   sodium chloride flush (NS) 0.9 % injection 5 mL, 5 mL, Intracatheter, Q8H, Henn, Adam, MD, 5 mL at 07/24/19 1258   sucralfate (CARAFATE) 1 GM/10ML suspension 1  g, 1 g, Oral, TID WC & HS, Greer Pickerel, MD, 1 g at 07/24/19 1258   Physical Exam:   Vitals:   07/24/19 0403 07/24/19 1418  BP: 119/68 114/63  Pulse: 100 (!) 101  Resp: 16 18  Temp: (!) 97.5 F (36.4 C) 97.9 F (36.6 C)  SpO2: 95% 95%   Physical Exam Gen: Morbidly obese, chronically ill, in mild distress secondary to abdominal discomfort, +anasarca, A&Ox 3 Head: NCAT, no temporal wasting evident EENT: PERRL, EOMI, MMM, adequate dentition Neck: supple, no JVD CV: NRRR, no murmurs evident Pulm: CTA bilaterally, no wheeze or retractions Abd: soft, obese, cholecystostomy drain in place with amber-colored drainage evident in collecting bag, NTND, +BS, rectal tube in place Extrems: 2+ nonpitting LE edema, 1+ pulses Skin: no rashes, adequate skin turgor Neuro: CN II-XII grossly intact, no focal neurologic deficits appreciated, gait was not assessed, A&Ox 3   Review of Systems:  Review of Systems  Constitutional: Positive for malaise/fatigue. Negative for chills, fever and weight loss.  HENT: Negative for congestion, hearing loss, sinus pain and sore throat.   Eyes: Negative for blurred vision, photophobia and discharge.  Respiratory: Negative for cough, hemoptysis and shortness of breath.   Cardiovascular: Negative for chest pain, palpitations, orthopnea and leg swelling.  Gastrointestinal: Positive for diarrhea. Negative for abdominal pain, constipation, heartburn, nausea and vomiting.  Genitourinary: Negative for dysuria, flank pain, frequency and urgency.  Musculoskeletal: Negative for back pain, joint pain and myalgias.  Skin: Negative for itching and rash.  Neurological: Negative for tremors, seizures, weakness and headaches.  Endo/Heme/Allergies: Negative for polydipsia. Does not bruise/bleed easily.  Psychiatric/Behavioral: Positive for depression. Negative for substance abuse. The patient is not nervous/anxious and does not have insomnia.      Lab Results  Component Value  Date   WBC 10.5 07/24/2019   HGB 8.0 (L) 07/24/2019   HCT 26.8 (L) 07/24/2019   MCV 93.4 07/24/2019   PLT 351 07/24/2019    Lab Results  Component Value Date   CREATININE 1.40 (H) 07/24/2019   BUN 19 07/24/2019   NA 138 07/24/2019   K 4.0 07/24/2019   CL 107 07/24/2019   CO2 24 07/24/2019    Lab Results  Component Value Date   ALT 12 07/24/2019   AST 17 07/24/2019   ALKPHOS 140 (H) 07/24/2019     Microbiology: Recent Results (from the past 240 hour(s))  Aerobic/Anaerobic Culture (surgical/deep wound)     Status: None (Preliminary result)   Collection Time: 07/23/19  5:02 PM   Specimen: BILE  Result Value Ref Range Status   Specimen Description BILE  Final   Special Requests NONE  Final   Gram Stain NO WBC SEEN RARE GRAM NEGATIVE RODS   Final   Culture   Final    CULTURE REINCUBATED FOR BETTER GROWTH Performed at [facility], [place] [address], [place], [place] [address]    Report Status PENDING  Incomplete  Culture, body fluid-bottle     Status: None (Preliminary result)   Collection Time: 07/23/19  5:55 PM   Specimen: Pleura  Result Value Ref Range Status   Specimen Description PLEURAL FLUID  Final   Special Requests   Final    BOTTLES DRAWN AEROBIC AND ANAEROBIC Blood Culture adequate volume   Culture   Final    NO GROWTH < 12 HOURS Performed at [facility], [address], [place], [place] [address]    Report Status PENDING  Incomplete  Gram stain     Status: None   Collection Time: 07/23/19  5:55 PM   Specimen: Pleura  Result Value Ref Range Status   Specimen Description PLEURAL FLUID  Final   Special Requests NONE  Final   Gram Stain   Final    FEW WBC PRESENT, PREDOMINANTLY PMN NO ORGANISMS SEEN Performed at [facility], [place] [address] [place], [place] [address]    Report Status 07/24/2019 FINAL  Final    Impression/Plan: Patient is a 68 year old morbidly obese white female with recent [id number] complicated by severe C.  difficile colitis, stage III chronic kidney disease, and advanced debility now with leukocytosis, probable cholecystitis, status post cholecystostomy drain placement.  1.  Severe C. difficile colitis -the patient's probable cholecystitis unfortunately requires ongoing antibiotics.  Prior to treatment of her cholecystitis, she was poorly tolerating oral treatment which is the staple of all C. difficile treatment options.  Her previous oral vancomycin was likely ineffective in part due to frequency of dosing and with the patient's difficulty tolerating oral medications.  Now that her percutaneous cholecystostomy drain has been placed, will begin de-escalating antibiotics by deseeding the patient's IV Flagyl as this further impairs her stool microbiome from adequate recovery.  We will continue oral fidaxomicin 200 mg p.o. twice daily, preferably for duration of unopposed treatment for 10 days after the cessation of other systemic antibiotics used to treat her cholecystitis.  The patient has multiple risk factors for recurrence including relative immobility/debility and morbid obesity.  If at all possible, I would encourage the patient ambulate or be moved (even with a Hoyer lift) from her bed daily to allow for exchanges of her bed linens.  This intervention will decrease her C. Difficile spore exposure and lower her risk for accidental autoinoculation.  She should also be allowed the opportunity to wash her hands with soap and water prior to all meals.  She remains in a high risk category for not only recurrence but ultimately mortality from C. difficile colitis given her morbid obesity and debility.  Now that her cholecystostomy drain has been placed, I would encourage aggressive PT/OT efforts as the patient reports self ambulation without assistive devices as recently as 2 to 3 months ago.  I would also advance the patient's diet as tolerated as more solid food does tend to help mechanically expel C. difficile  spores from her intestine.  Consider removing the patient's rectal tube as often successful treatment for C. difficile results and more formed stools, and her rectal tube may poses a source of pseudoobstruction as her stools become more formed.  2.  Cholecystitis -the patient underwent a successful cholecystostomy drain placement yesterday.  We will follow up her biliary cultures (thus far unrevealing but her Gram stain did show gram-negative rod), and tailor her antibiotic treatment at recognize pathogen to the most narrow antibiotic option available.  I agree with plans to leave the patient's cholecystostomy drain in place  for prolonged duration.  Now that she has had a successful intervention, may consider limiting her systemic antibiotics to approximately 5 to 6 days following drain placement to allow for earlier unopposed treatment for her C. difficile colitis.  3.  Leukocytosis -significant improvement noted following the patient's placement of a cholecystostomy drain, indicating that cholecystitis was present and perhaps the dominating issue leading to his leukocytosis rather than her prolonged C. difficile colitis.  Would check the patient's CBC with differential daily until she reliably has a trend of a normal white blood cell count for 2 to 3 days consecutively.

## 2019-07-24 NOTE — Progress Notes (Signed)
Pt has 2 bowel movement loose last night shift and 1 bm this am, Dr Loleta Books ordered flexiseal and intersheet for in between abdominal folds, informed IV team to saline lock the PICC line.

## 2019-07-24 NOTE — Progress Notes (Signed)
Pt has 3 bowel movement as of 7pm, applied flexiseal rectal tube, interdry sheet in between abdominal folds, PICC line saline locked.

## 2019-07-24 NOTE — Progress Notes (Addendum)
Pharmacy Antibiotic Note  Alice Kemp is a 68 y.o. female admitted on 07/04/2019 with Acute cholecystitis.  Pt also on fidaxomicin and IV Flagyl for C. diff prophylaxis/taper per ID rec. Pharmacy has been consulted for Unasyn dosing.  Pt underwent perc chole drain and paracentesis on 10/18. SCr bumped up from 1.09 to 1.67 on 10/17, today has improved to 1.40. Pt is afebrile, WBC trending down to 10.5.  Plan: Continue Unasyn 3g IV q6h Monitor renal fxn, clinical improvement, and LOT  Height: 5' 4.02" (162.6 cm) Weight: 279 lb 15.8 oz (127 kg) IBW/kg (Calculated) : 54.74  Temp (24hrs), Avg:97.7 F (36.5 C), Min:97.5 F (36.4 C), Max:97.8 F (36.6 C)  Recent Labs  Lab 07/19/19 0615 07/20/19 0445 07/21/19 0355 07/23/19 0359  WBC 6.4 11.8* 20.1* 16.1*  CREATININE 0.87 0.85 1.09* 1.67*    Estimated Creatinine Clearance: 42 mL/min (A) (by C-G formula based on SCr of 1.67 mg/dL (H)).    Allergies  Allergen Reactions  . Morphine And Related Other (See Comments)    Behavioral Issues  . Sulfa Antibiotics     Antimicrobials this admission: 9/28 remdesivir bolus, then d/c 9/28 vanc oral 500mg  q6>>10/16 (pt did not tolerate d/t nausea) 10/5 Flagyl >> 10/15 Unasyn>> 10/16 fidaxomicin>>   Microbiology this admission: 9/28 COVID: positive 9/28 BCx: [id number] 9/28 C diff: ag +, tox -, PCR + 10/1 MRSA PCR: negative 10/17: bile cx: pending 10/17 Pleural fluid cx: NG<12h 10/17 Pleural fluid Gram stain: no organisms seen  Berenice Bouton, PharmD PGY1 Pharmacy Resident Office phone: [phone] Phone until 3:30 pm: [id number] 07/24/2019 8:38 AM

## 2019-07-24 NOTE — Progress Notes (Signed)
Referring Physician(s): Jackson Latino, PA-C  Supervising Physician: Markus Daft  Patient Status:  [facility] - In-pt  Chief Complaint: Follow up percutaneous cholecystostomy placed 07/23/19 by Dr. Anselm Pancoast  Subjective:  Patient laying in bed watching TV, she states she feels better today. She is wondering when the culture will come back.   Allergies: Morphine and related and Sulfa antibiotics  Medications: Prior to Admission medications   Medication Sig Start Date End Date Taking? Authorizing Provider  allopurinol (ZYLOPRIM) 100 MG tablet TAKE 1 TABLET BY MOUTH ONCE DAILY Patient taking differently: Take 100 mg by mouth daily.  06/28/18  Yes Burns, Claudina Lick, MD  aspirin 81 MG tablet Take 81 mg by mouth daily. Reported on 12/24/2015   Yes [provider]  bisacodyl (DULCOLAX) 10 MG suppository Place 1 suppository (10 mg total) rectally daily as needed for moderate constipation. 06/05/19  Yes Sheikh, Omair Latif, DO  buPROPion (WELLBUTRIN SR) 200 MG 12 hr tablet Take 1 tablet (200 mg total) by mouth daily. 12/15/18  Yes Abby Potash, PA-C  DULoxetine (CYMBALTA) 30 MG capsule Take 1 capsule (30 mg total) by mouth 2 (two) times daily. 05/11/18  Yes Burns, Claudina Lick, MD  escitalopram (LEXAPRO) 10 MG tablet Take 10 mg by mouth daily.   Yes [provider]  ferrous sulfate 325 (65 FE) MG tablet Take 325 mg by mouth daily with breakfast. 06/07/19 07/19/19 Yes [provider]  fluticasone (CUTIVATE) 0.05 % cream APPLY 3 TIMES A DAY AS NEEDED FOR ITCHING Patient taking differently: Apply 1 application topically 3 (three) times daily as needed (itching).  07/23/12  Yes Renato Shin, MD  gabapentin (NEURONTIN) 300 MG capsule Take 300 mg by mouth 2 (two) times daily.  05/05/16  Yes [provider]  insulin aspart (NOVOLOG) 100 UNIT/ML injection Inject 2-14 Units into the skin See admin instructions. Per Sliding Scale: If Blood Sugar is less than 70, Call MD. If Blood Sugar if  201-250=2u, 251-300=4u, 301-350=6u, 351-400=8u, 401-450=10u, 451-500=12u, if >500=14u AND call MD.   Yes [provider]  insulin glargine (LANTUS) 100 UNIT/ML injection Inject 15 Units into the skin at bedtime.   Yes [provider]  levothyroxine (SYNTHROID, LEVOTHROID) 88 MCG tablet Take 88 mcg by mouth daily before breakfast.   Yes [provider]  loperamide (IMODIUM A-D) 2 MG tablet Take 2-4 mg by mouth See admin instructions. Take 2 tablets to start, then 1 tab after each loose stool. NOT TO EXCEED 4 TABS PER DAY   Yes [provider]  Magnesium 200 MG TABS Take 200 mg by mouth daily.    Yes [provider]  pantoprazole (PROTONIX) 40 MG tablet Take 1 tablet (40 mg total) by mouth at bedtime. 06/05/19  Yes Sheikh, Omair Latif, DO  polyethylene glycol (MIRALAX / GLYCOLAX) 17 g packet Take 17 g by mouth daily as needed. Patient taking differently: Take 17 g by mouth daily as needed for mild constipation.  06/05/19  Yes Sheikh, Omair Latif, DO  rosuvastatin (CRESTOR) 5 MG tablet Take 1 tablet (5 mg total) by mouth daily. 09/24/17  Yes Beasley, Caren D, MD  saccharomyces boulardii (FLORASTOR) 250 MG capsule Take 250 mg by mouth 2 (two) times daily.   Yes [provider]  sodium chloride 0.9 % Inject 2,000 mLs into the vein See admin instructions. Infuse 1st liter @75CC /HR, then Infuse 2nd liter @40CC /HR every shift   Yes [provider]  vitamin B-12 (CYANOCOBALAMIN) 500 MCG tablet Take 500  mcg by mouth every Monday, Wednesday, and Friday.    Yes [provider]  benzonatate (TESSALON) 100 MG capsule Take 1 capsule (100 mg total) by mouth 3 (three) times daily as needed for cough. Patient not taking: Reported on 07/04/2019 04/01/19   Hoyt Koch, MD  celecoxib (CELEBREX) 100 MG capsule Take 1 capsule (100 mg total) by mouth daily as needed. Patient not taking: Reported on 07/04/2019 05/19/19   [name], MD    colchicine 0.6 MG tablet TAKE 2 TABLETS BY MOUTH AT ONSET OF FLARE THEN TAKE 1 TABLET 1 HOUR LATER Patient not taking: TAKE 2 TABLETS BY MOUTH AT ONSET OF FLARE THEN TAKE 1 TABLET 1 HOUR LATER 06/28/18   Burns, Claudina Lick, MD  furosemide (LASIX) 80 MG tablet Take 1 tablet (80 mg total) by mouth daily. Patient not taking: Reported on 07/04/2019 05/28/15   Rowe Clack, MD  losartan (COZAAR) 25 MG tablet Take 1 tablet (25 mg total) by mouth daily. -- Office visit needed for further refills Patient not taking: Reported on 07/04/2019 11/12/17   [name], MD  meclizine (ANTIVERT) 25 MG tablet Take 0.5-1 tablets (12.5-25 mg total) by mouth 3 (three) times daily as needed for dizziness. Patient not taking: Reported on 07/04/2019 01/05/19   [name], MD  methocarbamol (ROBAXIN) 500 MG tablet Take 1 tablet (500 mg total) by mouth every 8 (eight) hours as needed for muscle spasms. Patient not taking: Reported on 07/04/2019 06/05/19   Raiford Noble Latif, DO  ondansetron (ZOFRAN) 4 MG tablet Take 1 tablet (4 mg total) by mouth every 6 (six) hours as needed for nausea or vomiting. Patient not taking: Reported on 07/04/2019 06/05/19   Raiford Noble [place]lee [place], DO  [facility] VERIO test strip Check BS twice daily 12/15/18   Abby Potash, PA-C  oxyCODONE 10 MG TABS Take 1 tablet (10 mg total) by mouth every 6 (six) hours as needed for severe pain. Patient not taking: Reported on 07/04/2019 06/05/19   Raiford Noble Latif, DO  senna-docusate (SENOKOT-S) 8.6-50 MG tablet Take 1 tablet by mouth 2 (two) times daily. Patient not taking: Reported on 07/04/2019 06/05/19   Raiford Noble Latif, DO  spironolactone (ALDACTONE) 25 MG tablet TAKE 1 TABLET (25 MG TOTAL) BY MOUTH DAILY. Patient not taking: Reported on 07/04/2019 06/28/18   [name], MD     Vital Signs: BP 119/68 (BP Location: Left Wrist)    Pulse 100    Temp (!) 97.5 F (36.4 C) (Oral)    Resp 16    Ht 5' 4.02" (1.626 m)    Wt 279 lb 15.8 oz (127 kg)     SpO2 95%    BMI 48.04 kg/m   Physical Exam Vitals signs and nursing note reviewed.  Constitutional:      General: She is not in acute distress.    Appearance: She is obese.  HENT:     Head: Normocephalic.  Cardiovascular:     Rate and Rhythm: Normal rate.  Pulmonary:     Effort: Pulmonary effort is normal.  Abdominal:     Palpations: Abdomen is soft.     Tenderness: There is abdominal tenderness (minimal over drain insertion site).     Comments: (+) RUQ drain to gravity with ~15 cc hazy bilious output with dark brown/black debris flecks in bag. Insertion site clean, dry, dressed appropriately. No active bleeding, drainage, erythema or edema noted.  Skin:    General: Skin is warm and  dry.  Neurological:     Mental Status: She is alert. Mental status is at baseline.     Imaging: Ct Abdomen Pelvis Wo Contrast  Result Date: 07/21/2019 CLINICAL DATA:  Abdominal pain and fever.  Evaluate for an abscess. EXAM: CT ABDOMEN AND PELVIS WITHOUT CONTRAST TECHNIQUE: Multidetector CT imaging of the abdomen and pelvis was performed following the standard protocol without IV contrast. COMPARISON:  Chest CT 12/05/2010 FINDINGS: Lower chest: Bilateral pleural effusions, right side greater than left. Right pleural effusion may be moderate in size. Again noted is a pericardial effusion and minimally changed from the prior chest CT examination. Hepatobiliary: Gallbladder is distended with high-density material suggestive for stones or sludge. Gallbladder measures up to 10 cm in largest dimension. [place] to moderate amount of perihepatic ascites. Limited evaluation of the liver on this noncontrast examination. No significant extrahepatic biliary dilatation. Pancreas: Unremarkable. No pancreatic ductal dilatation or surrounding inflammatory changes. Spleen: Normal appearance of the spleen without enlargement. Cannot exclude trace perisplenic ascites. Adrenals/Urinary Tract: Normal appearance of the adrenal  glands. No definite kidney stones. No hydronephrosis. Urinary bladder is decompressed with a Foley catheter. Stomach/Bowel: Normal appearance of the stomach and duodenum. Diverticula involving the sigmoid colon. No evidence for bowel obstruction or focal bowel inflammation. Vascular/Lymphatic: Atherosclerotic calcifications involving the aorta and visceral arteries. Negative for an abdominal aortic aneurysm. Mildly prominent lymph nodes in the pelvis. Index lymph node measures 1.2 cm in the short axis along the right iliac chain on sequence 2, image 72. Mildly prominent lymph nodes are nonspecific. Reproductive: Uterus and bilateral adnexa are poorly characterized on this examination but appear to be unremarkable. Other: Severe subcutaneous edema. Perihepatic ascites. Revak amount of ascites in the right paracolic gutter. Rupard amount of ascites in the pelvis. Negative for free intraperitoneal air. Musculoskeletal: Multilevel disc space disease. Disc disease is most prominent at L4-L5 and T12-L1. IMPRESSION: 1. Distended gallbladder with high-density material that could represent stones or sludge. In addition, there is perihepatic ascites. These findings are concerning for acute cholecystitis. Consider further characterization with a right upper quadrant ultrasound. 2. Bilateral pleural effusions, right side greater than left. Severe subcutaneous edema. Findings could represent fluid overload state. 3. Persistent pericardial effusion. Minimal change since exam in 2012. 4. Borderline lymphadenopathy in the pelvis. Pelvic lymph nodes are nonspecific but could be reactive. Electronically Signed   By: Markus Daft M.D.   On: 07/21/2019 13:09   Nm Hepatobiliary Liver Func  Result Date: 07/22/2019 CLINICAL DATA:  68 year old female with acute abdominal pain. EXAM: NUCLEAR MEDICINE HEPATOBILIARY IMAGING TECHNIQUE: Sequential images of the abdomen were obtained out to 60 minutes following intravenous administration of  radiopharmaceutical. RADIOPHARMACEUTICALS:  4.9 mCi Tc-28m  Choletec IV COMPARISON:  07/21/2019 ultrasound and CT FINDINGS: Prompt uptake and biliary excretion of activity by the liver is seen. Biliary activity passes into Enright bowel, consistent with patent common bile duct. Gallbladder activity is not visualized after 2 hours post radiopharmaceutical injection. The patient was not given morphine due to allergy. IMPRESSION: Nonvisualization of the gallbladder which is suspicious for cystic duct obstruction/acute cholecystitis. However, correlate with causes of false-positive studies as morphine could not be given to constrict the sphincter of Oddi due to allergy. These results will be called to the ordering clinician or representative by the Radiologist Assistant, and communication documented in the PACS or zVision Dashboard. Electronically Signed   By: Margarette Canada M.D.   On: 07/22/2019 16:17   Ir Perc Cholecystostomy  Result Date: 07/23/2019 INDICATION: 68 year old with  acute cholecystitis and recent COVID-19 infection. Patient is not a surgical candidate at this time. Plan for percutaneous cholecystostomy tube placement. EXAM: PERCUTANEOUS CHOLECYSTOSTOMY TUBE PLACEMENT WITH ULTRASOUND AND FLUOROSCOPIC GUIDANCE ULTRASOUND-GUIDED PARACENTESIS MEDICATIONS: Moderate sedation ANESTHESIA/SEDATION: Moderate (conscious) sedation was employed during this procedure. A total of Versed 1.0 mg and Fentanyl 50 mcg was administered intravenously. Moderate Sedation Time: 23 minutes. The patient's level of consciousness and vital signs were monitored continuously by radiology nursing throughout the procedure under my direct supervision. FLUOROSCOPY TIME:  Fluoroscopy Time: 36 seconds, 4 mGy COMPLICATIONS: None immediate. PROCEDURE: Informed written consent was obtained from the patient after a thorough discussion of the procedural risks, benefits and alternatives. All questions were addressed. Maximal Sterile Barrier  Technique was utilized including caps, mask, sterile gowns, sterile gloves, sterile drape, hand hygiene and skin antiseptic. A timeout was performed prior to the initiation of the procedure. Patient was placed supine. The right abdomen was prepped and draped in sterile fashion. Ultrasound was used to identify a distended gallbladder and perihepatic ascites. Skin was anesthetized with 1% lidocaine. Using ultrasound guidance, a Yueh catheter was directed into the perihepatic fluid adjacent to the gallbladder. Yellow clear fluid was aspirated. Approximately 300 mL of fluid was removed from the perihepatic space. Ultrasound was used to direct a 21 gauge needle into the distended gallbladder. 0.018 wire was advanced into the gallbladder under ultrasound guidance. An Accustick dilator set was placed. The tract was dilated over a J wire to accommodate a 10.2 [place] multipurpose drain. Greater than 150 mL of dark cloudy thick bilious fluid was aspirated from the gallbladder. Gallbladder was decompressed at the end of the procedure. Catheter was sutured to skin and attached to gravity bag. Fluoroscopic and ultrasound images were taken and saved for documentation. FINDINGS: Approximately 300 mL of clear yellow ascites was removed from the perihepatic space. 10.2 French drain was placed in the gallbladder and the gallbladder was successfully decompressed. IMPRESSION: 1. Successful percutaneous cholecystostomy tube placement with ultrasound and fluoroscopic guidance. Fluid was sent for culture. 2. Ultrasound-guided paracentesis. Ascites fluid was sent for culture. Electronically Signed   By: Markus Daft M.D.   On: 07/23/2019 17:41   Dg Chest Port 1 View  Result Date: 07/21/2019 CLINICAL DATA:  Covid positive.  Short of breath EXAM: PORTABLE CHEST 1 VIEW COMPARISON:  Radiograph 07/11/2019 FINDINGS: PICC line unchanged. Normal cardiac silhouette. No effusion, infiltrate or pneumothorax. Mild LEFT basilar atelectasis.  IMPRESSION: 1. No significant change. 2. No evidence pneumonia. 3. Mild LEFT basilar atelectasis. Electronically Signed   By: Suzy Bouchard M.D.   On: 07/21/2019 10:39   Dg Abd Portable 1v  Result Date: 07/21/2019 CLINICAL DATA:  COVID positive pt. Pt c/o abdominal pain EXAM: PORTABLE ABDOMEN - 1 VIEW COMPARISON:  07/11/2019 FINDINGS: Bowel gas pattern is nonobstructed. There is heterogeneous lucency projecting LATERAL to the LEFT chest wall. This could be related to stomach and significant abdominal wall laxity. Is difficult to exclude abscess or loculated extraluminal gas. Consider CT of the abdomen and pelvis as needed. Multiple remote rib fractures. IMPRESSION: 1. Abnormal lucency to the LEFT chest wall consistent with stomach or loculated extraluminal air. Consider CT of the abdomen and pelvis. 2. Multiple remote rib fractures. These results were called by telephone at the time of interpretation on 07/21/2019 at 10:12 am to provider [facility] , who verbally acknowledged these results. Electronically Signed   By: Nolon Nations M.D.   On: 07/21/2019 10:43   US Abdomen Limited Ruq  Result  Date: 07/21/2019 CLINICAL DATA:  68 year old female with abdominal pain and fever. EXAM: ULTRASOUND ABDOMEN LIMITED RIGHT UPPER QUADRANT COMPARISON:  CT of the abdomen pelvis dated 07/21/2019. FINDINGS: Evaluation is very limited due to patient's body habitus and inability of the patient to cooperate with the exam. Gallbladder: There is sludge and stone within the gallbladder. There is no gallbladder wall thickening or pericholecystic fluid. Evaluation of the mortise sign was limited. Common bile duct: Diameter: 3 mm. Liver: There is diffuse increased liver echogenicity most commonly seen in the setting of fatty infiltration. Superimposed inflammation or fibrosis is not excluded. Evaluation of the liver is limited due to body habitus and ascites. Portal vein is patent on color Doppler imaging with normal  direction of blood flow towards the liver. Other: Archila upper abdominal and perihepatic ascites. IMPRESSION: 1. Gallbladder sludge and stones without definite sonographic evidence of acute cholecystitis. Evaluation however is limited on this ultrasound. A hepatobiliary scintigraphy may provide better evaluation of the gallbladder if there is a high clinical concern for acute cholecystitis . 2. Fatty liver. 3. Ascites. Electronically Signed   By: Anner Crete M.D.   On: 07/21/2019 15:42   Ir Paracentesis  Result Date: 07/23/2019 INDICATION: 68 year old with acute cholecystitis and recent COVID-19 infection. Patient is not a surgical candidate at this time. Plan for percutaneous cholecystostomy tube placement. EXAM: PERCUTANEOUS CHOLECYSTOSTOMY TUBE PLACEMENT WITH ULTRASOUND AND FLUOROSCOPIC GUIDANCE ULTRASOUND-GUIDED PARACENTESIS MEDICATIONS: Moderate sedation ANESTHESIA/SEDATION: Moderate (conscious) sedation was employed during this procedure. A total of Versed 1.0 mg and Fentanyl 50 mcg was administered intravenously. Moderate Sedation Time: 23 minutes. The patient's level of consciousness and vital signs were monitored continuously by radiology nursing throughout the procedure under my direct supervision. FLUOROSCOPY TIME:  Fluoroscopy Time: 36 seconds, 4 mGy COMPLICATIONS: None immediate. PROCEDURE: Informed written consent was obtained from the patient after a thorough discussion of the procedural risks, benefits and alternatives. All questions were addressed. Maximal Sterile Barrier Technique was utilized including caps, mask, sterile gowns, sterile gloves, sterile drape, hand hygiene and skin antiseptic. A timeout was performed prior to the initiation of the procedure. Patient was placed supine. The right abdomen was prepped and draped in sterile fashion. Ultrasound was used to identify a distended gallbladder and perihepatic ascites. Skin was anesthetized with 1% lidocaine. Using ultrasound  guidance, a Yueh catheter was directed into the perihepatic fluid adjacent to the gallbladder. Yellow clear fluid was aspirated. Approximately 300 mL of fluid was removed from the perihepatic space. Ultrasound was used to direct a 21 gauge needle into the distended gallbladder. 0.018 wire was advanced into the gallbladder under ultrasound guidance. An Accustick dilator set was placed. The tract was dilated over a J wire to accommodate a 10.2 [place] multipurpose drain. Greater than 150 mL of dark cloudy thick bilious fluid was aspirated from the gallbladder. Gallbladder was decompressed at the end of the procedure. Catheter was sutured to skin and attached to gravity bag. Fluoroscopic and ultrasound images were taken and saved for documentation. FINDINGS: Approximately 300 mL of clear yellow ascites was removed from the perihepatic space. 10.2 French drain was placed in the gallbladder and the gallbladder was successfully decompressed. IMPRESSION: 1. Successful percutaneous cholecystostomy tube placement with ultrasound and fluoroscopic guidance. Fluid was sent for culture. 2. Ultrasound-guided paracentesis. Ascites fluid was sent for culture. Electronically Signed   By: Markus Daft M.D.   On: 07/23/2019 17:41    Labs:  CBC: Recent Labs    07/20/19 0445 07/21/19 0355 07/23/19 0359 07/24/19 [id number]  WBC 11.8* 20.1* 16.1* 10.5  HGB 8.4* 8.9* 8.1* 8.0*  HCT 27.9* 29.6* 25.9* 26.8*  PLT 305 339 375 351    COAGS: Recent Labs    07/23/19 1420  INR 1.4*    BMP: Recent Labs    07/20/19 0445 07/21/19 0355 07/23/19 0359 07/24/19 0853  NA 138 134* 137 138  K 4.3 4.2 4.3 4.0  CL 108 102 107 107  CO2 25 25 22 24   GLUCOSE 104* 99 128* 105*  BUN 13 13 20 19   CALCIUM 7.2* 7.3* 7.1* 7.0*  CREATININE 0.85 1.09* 1.67* 1.40*  GFRNONAA >60 52* 31* 38*  GFRAA >60 60* 36* 44*    LIVER FUNCTION TESTS: Recent Labs    07/20/19 0445 07/21/19 0355 07/23/19 0359 07/24/19 0853  BILITOT 0.2* 0.5 0.5  0.8  AST 26 24 17 17   ALT 18 17 13 12   ALKPHOS 202* 188* 159* 140*  PROT 4.6* 4.8* 4.5* 4.7*  ALBUMIN 1.3* 1.3* 1.1* 1.1*    Assessment and Plan:  68 y/o F s/p percutaneous cholecystostomy placement 07/23/19 seen today for follow up - she reports improvement in abdominal pain and is tolerating some PO intake.   Per I/O 300 cc output since placement, output hazy bilious with some debris on my exam today. Insertion site is unremarkable, no issues noted with flushing by staff. Preliminary culture of bile shows rare gram negative rods. Per [facility] note from earlier today plan for lap chole in 6-8 weeks. Currently receiving Unasyn + Flagyl IV per primary team.  Continue current drain management - flush drain TID with 3-5 cc NS, reconnect drain to gravity, record output Qshift, dressing changes QD. Call IR if unable to flush drain.   Plans per primary team. IR will continue to follow.  Please call with questions or concerns.   Electronically Signed: Joaquim Nam, PA-C 07/24/2019, 12:32 PM   I spent a total of 15 Minutes at the the patient's bedside AND on the patient's hospital floor or unit, greater than 50% of which was counseling/coordinating care for follow up percutaneous cholecystostomy placement.

## 2019-07-24 NOTE — Progress Notes (Signed)
PROGRESS NOTE    Alice Kemp  [id number] DOB: Oct 23, 1950 DOA: 07/04/2019 PCP: [name], MD      Brief Narrative:  Alice Kemp is a 68 y.o. F with obesity, hypothyroidism, morbid obesity, dyslipidemia, DM, chronic diastolic heart failure, CKD stage III who presented with fall, found to have dehydration from Cdiff.  After treatment at [facility] with oral vancomycin, she developed new acute cholecystitis by CT, transferred to [facility].       Assessment & Plan:  Acute cholecystitis S/p perc drain No new fever.  WBC normal.  Abdominal pain seems resolved. -Continue Unasyn -Consult Gen Surg -Consult IR   C diff Stools only 3 in last 24 hours, seems better -Consult ID, appreciate ares -Continue fidaxomicin, Flagyl  Hypothyroidism -Continue levothyroxine  Stage II coccyx pressure injury, not POA   Acute on chronic diastolic CHF I/O up [id number] on admission.  Anasarcic.  Home Lasix has been on hold.  Unasyn has high sodium load.   -Trial Lasix -Monitor Cr -Close monitoring I/Os -Increase Lasix or increase to BID if good response  Diabetes with polyneuropathy -Continue aspirin, Crestor -Continue gabapentin -Continue SSI corrections  Other medicaitons -Continue Well butrin, duloxetine -Continue midodrine -Continue Sucralfate  Anemia of chronic disease Hgb stable  AKI Cr at baseline 0.9, yesterday up to 1.7.  I suspect this is congestive. -Lasix and CLOSE monitoring Cr  Ascites Cell counts nor protein sent, but this seems more likely from CHF.   -Follow ascites fluid culture  -Diurese as above      MDM and disposition: The below labs and imaging reports were reviewed and summarized above.  Medication management as above.  The patient was admitted with Cidff, ofund to have cholecystitis.  She appears to be improvijg but is significantly debilitated, unable to assist with any self cares.  We will monitor PO intake, work with PT.  Still several days at least  from being medically cleared for D/c.        DVT prophylaxis: SCDs Code Status: DO NOT RESUSCITATE Family Communication:     Consultants:   Gen Surg  ID  IR  Procedures:   10/17 perc drain    Subjective: Feeling slightly better.  Main concern is her intertriginous skin breakdown, which someone told her was cellulitis.  No fever, vomiting, respiratory distress.  Her edema is marked.  Objective: Vitals:   07/23/19 1640 07/23/19 1645 07/23/19 1953 07/24/19 0403  BP: 114/77 112/64 (!) 124/57 119/68  Pulse: 98 98 85 100  Resp: (!) 0 (!) 8 18 16   Temp:   97.8 F (36.6 C) (!) 97.5 F (36.4 C)  TempSrc:   Oral Oral  SpO2: 100% 100% 98% 95%  Weight:      Height:        Intake/Output Summary (Last 24 hours) at 07/24/2019 1105 Last data filed at 07/24/2019 1008 Gross per 24 hour  Intake 415 ml  Output 1225 ml  Net -810 ml   Filed Weights   07/21/19 0500 07/22/19 0500 07/23/19 0500  Weight: 128.6 kg 128.6 kg 127 kg    Examination: General appearance:  adult female, alert and in no acute distress.  Appears tired and debilitated severely HEENT: Anicteric, conjunctiva pink, lids and lashes normal. No nasal deformity, discharge, epistaxis.  Lips moist, OP moist, dentition good repair, hearing normal.   Skin: Warm and dry.  Anasarcic.  no jaundice.  No suspicious rashes or lesions. Cardiac: RRR, nl S1-S2, no murmurs appreciated.  Capillary refill is  brisk.  JVP not visible.  2+ LE edema.  Radial pulses 2+ and symmetric. Respiratory: Normal respiratory rate and rhythm.  CTAB without rales or wheezes. Abdomen: Abdomen soft.  No TTP. No ascites, distension, hepatosplenomegaly.   MSK: No deformities or effusions. Neuro: Awake and alert.  EOMI, moves all extremities. Speech fluent.    Psych: Sensorium intact and responding to questions, attention normal. Affect flat.  Judgment and insight appear normal.    Data Reviewed: I have personally reviewed following labs and  imaging studies:  CBC: Recent Labs  Lab 07/19/19 0615 07/20/19 0445 07/21/19 0355 07/23/19 0359 07/24/19 0853  WBC 6.4 11.8* 20.1* 16.1* 10.5  NEUTROABS 2.6 7.4 13.5* 10.8* 9.0*  HGB 8.4* 8.4* 8.9* 8.1* 8.0*  HCT 28.0* 27.9* 29.6* 25.9* 26.8*  MCV 92.1 90.6 91.6 90.9 93.4  PLT 284 305 339 375 [id number]   Basic Metabolic Panel: Recent Labs  Lab 07/19/19 0615 07/20/19 0445 07/21/19 0355 07/23/19 0359 07/24/19 0853  NA 138 138 134* 137 138  K 3.8 4.3 4.2 4.3 4.0  CL 106 108 102 107 107  CO2 26 25 25 22 24   GLUCOSE 74 104* 99 128* 105*  BUN 17 13 13 20 19   CREATININE 0.87 0.85 1.09* 1.67* 1.40*  CALCIUM 7.3* 7.2* 7.3* 7.1* 7.0*  MG 1.6* 1.7 1.7  --   --    GFR: Estimated Creatinine Clearance: 50.1 mL/min (A) (by C-G formula based on SCr of 1.4 mg/dL (H)). Liver Function Tests: Recent Labs  Lab 07/19/19 0615 07/20/19 0445 07/21/19 0355 07/23/19 0359 07/24/19 0853  AST 31 26 24 17 17   ALT 18 18 17 13 12   ALKPHOS 227* 202* 188* 159* 140*  BILITOT 0.6 0.2* 0.5 0.5 0.8  PROT 4.7* 4.6* 4.8* 4.5* 4.7*  ALBUMIN 1.3* 1.3* 1.3* 1.1* 1.1*   No results for input(s): LIPASE, AMYLASE in the last 168 hours. No results for input(s): AMMONIA in the last 168 hours. Coagulation Profile: Recent Labs  Lab 07/23/19 1420  INR 1.4*   Cardiac Enzymes: No results for input(s): CKTOTAL, CKMB, CKMBINDEX, TROPONINI in the last 168 hours. BNP (last 3 results) No results for input(s): PROBNP in the last 8760 hours. HbA1C: No results for input(s): HGBA1C in the last 72 hours. CBG: Recent Labs  Lab 07/23/19 1804 07/23/19 1956 07/23/19 2319 07/24/19 0406 07/24/19 0748  GLUCAP 106* 105* 94 99 100*   Lipid Profile: No results for input(s): CHOL, HDL, LDLCALC, TRIG, CHOLHDL, LDLDIRECT in the last 72 hours. Thyroid Function Tests: No results for input(s): TSH, T4TOTAL, FREET4, T3FREE, THYROIDAB in the last 72 hours. Anemia Panel: No results for input(s): VITAMINB12, FOLATE, FERRITIN,  TIBC, IRON, RETICCTPCT in the last 72 hours. Urine analysis:    Component Value Date/Time   COLORURINE COLORLESS (A) 05/24/2019 0612   APPEARANCEUR CLEAR 05/24/2019 0612   LABSPEC 1.004 (L) 05/24/2019 0612   PHURINE 7.0 05/24/2019 0612   GLUCOSEU NEGATIVE 05/24/2019 0612   GLUCOSEU 100 05/10/2012 1440   HGBUR NEGATIVE 05/24/2019 0612   BILIRUBINUR NEGATIVE 05/24/2019 0612   KETONESUR NEGATIVE 05/24/2019 0612   PROTEINUR NEGATIVE 05/24/2019 0612   UROBILINOGEN 0.2 05/10/2012 1440   NITRITE NEGATIVE 05/24/2019 0612   LEUKOCYTESUR NEGATIVE 05/24/2019 0612   Sepsis Labs: @LABRCNTIP (procalcitonin:4,lacticacidven:4)  ) Recent Results (from the past 240 hour(s))  Aerobic/Anaerobic Culture (surgical/deep wound)     Status: None (Preliminary result)   Collection Time: 07/23/19  5:02 PM   Specimen: BILE  Result Value Ref Range Status   Specimen  Description BILE  Final   Special Requests NONE  Final   Gram Stain NO WBC SEEN RARE GRAM NEGATIVE RODS   Final   Culture   Final    CULTURE REINCUBATED FOR BETTER GROWTH Performed at [facility], [address] [place], [place] [address]    Report Status PENDING  Incomplete  Culture, body fluid-bottle     Status: None (Preliminary result)   Collection Time: 07/23/19  5:55 PM   Specimen: Pleura  Result Value Ref Range Status   Specimen Description PLEURAL FLUID  Final   Special Requests   Final    BOTTLES DRAWN AEROBIC AND ANAEROBIC Blood Culture adequate volume   Culture   Final    NO GROWTH < 12 HOURS Performed at [facility], [place] [address], [place], [place] [address]    Report Status PENDING  Incomplete  Gram stain     Status: None   Collection Time: 07/23/19  5:55 PM   Specimen: Pleura  Result Value Ref Range Status   Specimen Description PLEURAL FLUID  Final   Special Requests NONE  Final   Gram Stain   Final    FEW WBC PRESENT, PREDOMINANTLY PMN NO ORGANISMS SEEN Performed at [facility], [place] [address], [place], [place] [address]    Report Status 07/24/2019 FINAL  Final         Radiology Studies: Nm Hepatobiliary Liver Func  Result Date: 07/22/2019 CLINICAL DATA:  68 year old female with acute abdominal pain. EXAM: NUCLEAR MEDICINE HEPATOBILIARY IMAGING TECHNIQUE: Sequential images of the abdomen were obtained out to 60 minutes following intravenous administration of radiopharmaceutical. RADIOPHARMACEUTICALS:  4.9 mCi Tc-62m  Choletec IV COMPARISON:  07/21/2019 ultrasound and CT FINDINGS: Prompt uptake and biliary excretion of activity by the liver is seen. Biliary activity passes into Voigt bowel, consistent with patent common bile duct. Gallbladder activity is not visualized after 2 hours post radiopharmaceutical injection. The patient was not given morphine due to allergy. IMPRESSION: Nonvisualization of the gallbladder which is suspicious for cystic duct obstruction/acute cholecystitis. However, correlate with causes of false-positive studies as morphine could not be given to constrict the sphincter of Oddi due to allergy. These results will be called to the ordering clinician or representative by the Radiologist Assistant, and communication documented in the PACS or zVision Dashboard. Electronically Signed   By: Margarette Canada M.D.   On: 07/22/2019 16:17   Ir Perc Cholecystostomy  Result Date: 07/23/2019 INDICATION: 68 year old with acute cholecystitis and recent COVID-19 infection. Patient is not a surgical candidate at this time. Plan for percutaneous cholecystostomy tube placement. EXAM: PERCUTANEOUS CHOLECYSTOSTOMY TUBE PLACEMENT WITH ULTRASOUND AND FLUOROSCOPIC GUIDANCE ULTRASOUND-GUIDED PARACENTESIS MEDICATIONS: Moderate sedation ANESTHESIA/SEDATION: Moderate (conscious) sedation was employed during this procedure. A total of Versed 1.0 mg and Fentanyl 50 mcg was administered intravenously. Moderate Sedation Time: 23 minutes. The patient's level of consciousness and vital signs were  monitored continuously by radiology nursing throughout the procedure under my direct supervision. FLUOROSCOPY TIME:  Fluoroscopy Time: 36 seconds, 4 mGy COMPLICATIONS: None immediate. PROCEDURE: Informed written consent was obtained from the patient after a thorough discussion of the procedural risks, benefits and alternatives. All questions were addressed. Maximal Sterile Barrier Technique was utilized including caps, mask, sterile gowns, sterile gloves, sterile drape, hand hygiene and skin antiseptic. A timeout was performed prior to the initiation of the procedure. Patient was placed supine. The right abdomen was prepped and draped in sterile fashion. Ultrasound was used to identify a  distended gallbladder and perihepatic ascites. Skin was anesthetized with 1% lidocaine. Using ultrasound guidance, a Yueh catheter was directed into the perihepatic fluid adjacent to the gallbladder. Yellow clear fluid was aspirated. Approximately 300 mL of fluid was removed from the perihepatic space. Ultrasound was used to direct a 21 gauge needle into the distended gallbladder. 0.018 wire was advanced into the gallbladder under ultrasound guidance. An Accustick dilator set was placed. The tract was dilated over a J wire to accommodate a 10.2 [place] multipurpose drain. Greater than 150 mL of dark cloudy thick bilious fluid was aspirated from the gallbladder. Gallbladder was decompressed at the end of the procedure. Catheter was sutured to skin and attached to gravity bag. Fluoroscopic and ultrasound images were taken and saved for documentation. FINDINGS: Approximately 300 mL of clear yellow ascites was removed from the perihepatic space. 10.2 French drain was placed in the gallbladder and the gallbladder was successfully decompressed. IMPRESSION: 1. Successful percutaneous cholecystostomy tube placement with ultrasound and fluoroscopic guidance. Fluid was sent for culture. 2. Ultrasound-guided paracentesis. Ascites fluid was sent  for culture. Electronically Signed   By: Markus Daft M.D.   On: 07/23/2019 17:41   Ir Paracentesis  Result Date: 07/23/2019 INDICATION: 68 year old with acute cholecystitis and recent COVID-19 infection. Patient is not a surgical candidate at this time. Plan for percutaneous cholecystostomy tube placement. EXAM: PERCUTANEOUS CHOLECYSTOSTOMY TUBE PLACEMENT WITH ULTRASOUND AND FLUOROSCOPIC GUIDANCE ULTRASOUND-GUIDED PARACENTESIS MEDICATIONS: Moderate sedation ANESTHESIA/SEDATION: Moderate (conscious) sedation was employed during this procedure. A total of Versed 1.0 mg and Fentanyl 50 mcg was administered intravenously. Moderate Sedation Time: 23 minutes. The patient's level of consciousness and vital signs were monitored continuously by radiology nursing throughout the procedure under my direct supervision. FLUOROSCOPY TIME:  Fluoroscopy Time: 36 seconds, 4 mGy COMPLICATIONS: None immediate. PROCEDURE: Informed written consent was obtained from the patient after a thorough discussion of the procedural risks, benefits and alternatives. All questions were addressed. Maximal Sterile Barrier Technique was utilized including caps, mask, sterile gowns, sterile gloves, sterile drape, hand hygiene and skin antiseptic. A timeout was performed prior to the initiation of the procedure. Patient was placed supine. The right abdomen was prepped and draped in sterile fashion. Ultrasound was used to identify a distended gallbladder and perihepatic ascites. Skin was anesthetized with 1% lidocaine. Using ultrasound guidance, a Yueh catheter was directed into the perihepatic fluid adjacent to the gallbladder. Yellow clear fluid was aspirated. Approximately 300 mL of fluid was removed from the perihepatic space. Ultrasound was used to direct a 21 gauge needle into the distended gallbladder. 0.018 wire was advanced into the gallbladder under ultrasound guidance. An Accustick dilator set was placed. The tract was dilated over a J wire  to accommodate a 10.2 [place] multipurpose drain. Greater than 150 mL of dark cloudy thick bilious fluid was aspirated from the gallbladder. Gallbladder was decompressed at the end of the procedure. Catheter was sutured to skin and attached to gravity bag. Fluoroscopic and ultrasound images were taken and saved for documentation. FINDINGS: Approximately 300 mL of clear yellow ascites was removed from the perihepatic space. 10.2 French drain was placed in the gallbladder and the gallbladder was successfully decompressed. IMPRESSION: 1. Successful percutaneous cholecystostomy tube placement with ultrasound and fluoroscopic guidance. Fluid was sent for culture. 2. Ultrasound-guided paracentesis. Ascites fluid was sent for culture. Electronically Signed   By: Markus Daft M.D.   On: 07/23/2019 17:41        Scheduled Meds: . aspirin  81 mg Oral Daily  .  buPROPion  100 mg Oral BID  . Chlorhexidine Gluconate Cloth  6 each Topical Daily  . DULoxetine  30 mg Oral BID  . enoxaparin (LOVENOX) injection  60 mg Subcutaneous Q24H  . feeding supplement (ENSURE ENLIVE)  237 mL Oral TID BM  . feeding supplement (PRO-STAT SUGAR FREE 64)  30 mL Oral TID WC  . fidaxomicin  200 mg Oral BID  . gabapentin  300 mg Oral TID  . Gerhardt's butt cream  1 application Topical TID  . insulin aspart  0-15 Units Subcutaneous Q6H  . levothyroxine  88 mcg Oral QAC breakfast  . midodrine  10 mg Oral BID WC  . multivitamin with minerals  1 tablet Oral Daily  . rosuvastatin  5 mg Oral Daily  . sodium chloride flush  10-40 mL Intracatheter Q12H  . sodium chloride flush  5 mL Intracatheter Q8H  . sucralfate  1 g Oral TID WC & HS   Continuous Infusions: . sodium chloride 75 mL/hr at 07/24/19 1003  . ampicillin-sulbactam (UNASYN) IV 3 g (07/24/19 1004)  . metronidazole 500 mg (07/24/19 0720)     LOS: 20 days    Time spent: 35 minutes    Edwin Dada, MD [facility] 07/24/2019, 11:05 AM     Please  page through [employer]:  [email or web address] If 7PM-7AM, please contact night-coverage

## 2019-07-24 NOTE — Progress Notes (Signed)
[facility]/Trauma Progress Note      Assessment/Plan Covid + without resp symptoms Severe C diff colitis Severe protein calorie malnutrition Severe obesity AKI - resolved DM2 Deconditioning Obstructive sleep apnea Hepatic steatosis  Nausea/vomiting/Leukocytosis - [name] and CT concerning for cholecystitis - HIDA did not show filling of gallbladder, morphine was unable to be administered - S/p percutaneous cholecystectomy tube, IR, 10/17 - Advance diet as tolerated  FEN: CLD, advance to full's as tolerated VTE: SCD's, lovenox ID: Flagyl Unasyn Dificid    WBC 10.5, afebrile Follow up: TBD  DISPO:  Ambulate, advance diet. CMP pending.  We will plan for lap chole in 6 to 8 weeks.   LOS: 20 days    Subjective: CC: No complaints  Patient denies abdominal pain, nausea, vomiting or other issues overnight.  She states her skin is sore on her abdomen in the area of her yeast infection.  Objective: Vital signs in last 24 hours: Temp:  [97.5 F (36.4 C)-97.8 F (36.6 C)] 97.5 F (36.4 C) (10/18 0403) Pulse Rate:  [85-106] 100 (10/18 0403) Resp:  [0-18] 16 (10/18 0403) BP: (112-134)/(57-110) 119/68 (10/18 0403) SpO2:  [95 %-100 %] 95 % (10/18 0403) Last BM Date: 07/24/19  Intake/Output from previous day: 10/17 0701 - 10/18 0700 In: 415 [I.V.:10; IV Piggyback:400] Out: 1225 [Urine:925; Drains:300] Intake/Output this shift: No intake/output data recorded.  PE: Gen:  Alert, NAD, pleasant, cooperative Pulm:  Rate and effort normal Abd: Soft, ND, no HSM, obese, no TTP, no peritonitis, perc cholecystectomy drain with yellow/green fluid Skin: no rashes noted, warm and dry  Anti-infectives: Anti-infectives (From admission, onward)   Start     Dose/Rate Route Frequency Ordered Stop   07/22/19 2200  fidaxomicin (DIFICID) tablet 200 mg     200 mg Oral 2 times daily 07/22/19 1626     07/21/19 1600  Ampicillin-Sulbactam (UNASYN) 3 g in sodium chloride 0.9 % 100  mL IVPB     3 g 200 mL/hr over 30 Minutes Intravenous Every 6 hours 07/21/19 1448     07/15/19 1400  metroNIDAZOLE (FLAGYL) IVPB 500 mg     500 mg 100 mL/hr over 60 Minutes Intravenous Every 8 hours 07/15/19 0953     07/13/19 1000  metroNIDAZOLE (FLAGYL) tablet 500 mg  Status:  Discontinued     500 mg Oral Every 8 hours 07/13/19 0952 07/15/19 0953   07/12/19 0400  metroNIDAZOLE (FLAGYL) IVPB 500 mg  Status:  Discontinued     500 mg 100 mL/hr over 60 Minutes Intravenous Every 8 hours 07/11/19 1935 07/13/19 0952   07/11/19 1200  metroNIDAZOLE (FLAGYL) IVPB 500 mg  Status:  Discontinued     500 mg 100 mL/hr over 60 Minutes Intravenous Every 8 hours 07/11/19 1055 07/11/19 1935   07/09/19 2130  vancomycin (VANCOCIN) 50 mg/mL oral solution 500 mg  Status:  Discontinued     500 mg Oral Every 6 hours 07/08/19 2104 07/08/19 2146   07/08/19 2200  vancomycin (VANCOCIN) 50 mg/mL oral solution 500 mg  Status:  Discontinued     500 mg Oral Every 6 hours 07/08/19 2146 07/22/19 1626   07/05/19 1600  remdesivir 100 mg in sodium chloride 0.9 % 250 mL IVPB  Status:  Discontinued     100 mg 500 mL/hr over 30 Minutes Intravenous Every 24 hours 07/04/19 1517 07/05/19 0734   07/04/19 1600  remdesivir 200 mg in sodium chloride 0.9 % 250 mL IVPB     200 mg 500 mL/hr over  30 Minutes Intravenous Once 07/04/19 1517 07/04/19 1941   07/04/19 1400  vancomycin (VANCOCIN) 50 mg/mL oral solution 500 mg  Status:  Discontinued     500 mg Oral Every 6 hours 07/04/19 1134 07/08/19 2104      Lab Results:  Recent Labs    07/23/19 0359 07/24/19 0853  WBC 16.1* 10.5  HGB 8.1* 8.0*  HCT 25.9* 26.8*  PLT 375 351   BMET Recent Labs    07/23/19 0359  NA 137  K 4.3  CL 107  CO2 22  GLUCOSE 128*  BUN 20  CREATININE 1.67*  CALCIUM 7.1*   PT/INR Recent Labs    07/23/19 1420  LABPROT 17.3*  INR 1.4*   CMP     Component Value Date/Time   NA 137 07/23/2019 0359   NA 137 10/14/2018 1428   K 4.3 07/23/2019  0359   CL 107 07/23/2019 0359   CO2 22 07/23/2019 0359   GLUCOSE 128 (H) 07/23/2019 0359   BUN 20 07/23/2019 0359   BUN 18 10/14/2018 1428   CREATININE 1.67 (H) 07/23/2019 0359   CALCIUM 7.1 (L) 07/23/2019 0359   PROT 4.5 (L) 07/23/2019 0359   PROT 7.5 10/14/2018 1428   ALBUMIN 1.1 (L) 07/23/2019 0359   ALBUMIN 3.7 10/14/2018 1428   AST 17 07/23/2019 0359   ALT 13 07/23/2019 0359   ALKPHOS 159 (H) 07/23/2019 0359   BILITOT 0.5 07/23/2019 0359   BILITOT 0.3 10/14/2018 1428   GFRNONAA 31 (L) 07/23/2019 0359   GFRAA 36 (L) 07/23/2019 0359   Lipase  No results found for: LIPASE  Studies/Results: Nm Hepatobiliary Liver Func  Result Date: 07/22/2019 CLINICAL DATA:  68 year old female with acute abdominal pain. EXAM: NUCLEAR MEDICINE HEPATOBILIARY IMAGING TECHNIQUE: Sequential images of the abdomen were obtained out to 60 minutes following intravenous administration of radiopharmaceutical. RADIOPHARMACEUTICALS:  4.9 mCi Tc-20m  Choletec IV COMPARISON:  07/21/2019 ultrasound and CT FINDINGS: Prompt uptake and biliary excretion of activity by the liver is seen. Biliary activity passes into [place] bowel, consistent with patent common bile duct. Gallbladder activity is not visualized after 2 hours post radiopharmaceutical injection. The patient was not given morphine due to allergy. IMPRESSION: Nonvisualization of the gallbladder which is suspicious for cystic duct obstruction/acute cholecystitis. However, correlate with causes of false-positive studies as morphine could not be given to constrict the sphincter of Oddi due to allergy. These results will be called to the ordering clinician or representative by the Radiologist Assistant, and communication documented in the PACS or zVision Dashboard. Electronically Signed   By: Margarette Canada M.D.   On: 07/22/2019 16:17   Ir Perc Cholecystostomy  Result Date: 07/23/2019 INDICATION: 68 year old with acute cholecystitis and recent COVID-19 infection.  Patient is not a surgical candidate at this time. Plan for percutaneous cholecystostomy tube placement. EXAM: PERCUTANEOUS CHOLECYSTOSTOMY TUBE PLACEMENT WITH ULTRASOUND AND FLUOROSCOPIC GUIDANCE ULTRASOUND-GUIDED PARACENTESIS MEDICATIONS: Moderate sedation ANESTHESIA/SEDATION: Moderate (conscious) sedation was employed during this procedure. A total of Versed 1.0 mg and Fentanyl 50 mcg was administered intravenously. Moderate Sedation Time: 23 minutes. The patient's level of consciousness and vital signs were monitored continuously by radiology nursing throughout the procedure under my direct supervision. FLUOROSCOPY TIME:  Fluoroscopy Time: 36 seconds, 4 mGy COMPLICATIONS: None immediate. PROCEDURE: Informed written consent was obtained from the patient after a thorough discussion of the procedural risks, benefits and alternatives. All questions were addressed. Maximal Sterile Barrier Technique was utilized including caps, mask, sterile gowns, sterile gloves, sterile drape, hand hygiene and  skin antiseptic. A timeout was performed prior to the initiation of the procedure. Patient was placed supine. The right abdomen was prepped and draped in sterile fashion. Ultrasound was used to identify a distended gallbladder and perihepatic ascites. Skin was anesthetized with 1% lidocaine. Using ultrasound guidance, a Yueh catheter was directed into the perihepatic fluid adjacent to the gallbladder. Yellow clear fluid was aspirated. Approximately 300 mL of fluid was removed from the perihepatic space. Ultrasound was used to direct a 21 gauge needle into the distended gallbladder. 0.018 wire was advanced into the gallbladder under ultrasound guidance. An Accustick dilator set was placed. The tract was dilated over a J wire to accommodate a 10.2 [place] multipurpose drain. Greater than 150 mL of dark cloudy thick bilious fluid was aspirated from the gallbladder. Gallbladder was decompressed at the end of the procedure. Catheter  was sutured to skin and attached to gravity bag. Fluoroscopic and ultrasound images were taken and saved for documentation. FINDINGS: Approximately 300 mL of clear yellow ascites was removed from the perihepatic space. 10.2 French drain was placed in the gallbladder and the gallbladder was successfully decompressed. IMPRESSION: 1. Successful percutaneous cholecystostomy tube placement with ultrasound and fluoroscopic guidance. Fluid was sent for culture. 2. Ultrasound-guided paracentesis. Ascites fluid was sent for culture. Electronically Signed   By: Markus Daft M.D.   On: 07/23/2019 17:41   Ir Paracentesis  Result Date: 07/23/2019 INDICATION: 68 year old with acute cholecystitis and recent COVID-19 infection. Patient is not a surgical candidate at this time. Plan for percutaneous cholecystostomy tube placement. EXAM: PERCUTANEOUS CHOLECYSTOSTOMY TUBE PLACEMENT WITH ULTRASOUND AND FLUOROSCOPIC GUIDANCE ULTRASOUND-GUIDED PARACENTESIS MEDICATIONS: Moderate sedation ANESTHESIA/SEDATION: Moderate (conscious) sedation was employed during this procedure. A total of Versed 1.0 mg and Fentanyl 50 mcg was administered intravenously. Moderate Sedation Time: 23 minutes. The patient's level of consciousness and vital signs were monitored continuously by radiology nursing throughout the procedure under my direct supervision. FLUOROSCOPY TIME:  Fluoroscopy Time: 36 seconds, 4 mGy COMPLICATIONS: None immediate. PROCEDURE: Informed written consent was obtained from the patient after a thorough discussion of the procedural risks, benefits and alternatives. All questions were addressed. Maximal Sterile Barrier Technique was utilized including caps, mask, sterile gowns, sterile gloves, sterile drape, hand hygiene and skin antiseptic. A timeout was performed prior to the initiation of the procedure. Patient was placed supine. The right abdomen was prepped and draped in sterile fashion. Ultrasound was used to identify a distended  gallbladder and perihepatic ascites. Skin was anesthetized with 1% lidocaine. Using ultrasound guidance, a Yueh catheter was directed into the perihepatic fluid adjacent to the gallbladder. Yellow clear fluid was aspirated. Approximately 300 mL of fluid was removed from the perihepatic space. Ultrasound was used to direct a 21 gauge needle into the distended gallbladder. 0.018 wire was advanced into the gallbladder under ultrasound guidance. An Accustick dilator set was placed. The tract was dilated over a J wire to accommodate a 10.2 [place] multipurpose drain. Greater than 150 mL of dark cloudy thick bilious fluid was aspirated from the gallbladder. Gallbladder was decompressed at the end of the procedure. Catheter was sutured to skin and attached to gravity bag. Fluoroscopic and ultrasound images were taken and saved for documentation. FINDINGS: Approximately 300 mL of clear yellow ascites was removed from the perihepatic space. 10.2 French drain was placed in the gallbladder and the gallbladder was successfully decompressed. IMPRESSION: 1. Successful percutaneous cholecystostomy tube placement with ultrasound and fluoroscopic guidance. Fluid was sent for culture. 2. Ultrasound-guided paracentesis. Ascites fluid was sent for  culture. Electronically Signed   By: Markus Daft M.D.   On: 07/23/2019 17:41     Kalman Drape, [facility] Pager [phone] Cristine Polio, & Friday 7:00am - 4:30pm Thursdays 7:00am -11:30am

## 2019-07-25 DIAGNOSIS — R197 Diarrhea, unspecified: Secondary | ICD-10-CM

## 2019-07-25 DIAGNOSIS — R601 Generalized edema: Secondary | ICD-10-CM | POA: Diagnosis not present

## 2019-07-25 DIAGNOSIS — N179 Acute kidney failure, unspecified: Secondary | ICD-10-CM | POA: Diagnosis not present

## 2019-07-25 DIAGNOSIS — Z933 Colostomy status: Secondary | ICD-10-CM

## 2019-07-25 DIAGNOSIS — K819 Cholecystitis, unspecified: Secondary | ICD-10-CM | POA: Diagnosis not present

## 2019-07-25 DIAGNOSIS — R410 Disorientation, unspecified: Secondary | ICD-10-CM | POA: Diagnosis not present

## 2019-07-25 LAB — COMPREHENSIVE METABOLIC PANEL
ALT: 12 U/L (ref 0–44)
AST: 17 U/L (ref 15–41)
Albumin: 1 g/dL — ABNORMAL LOW (ref 3.5–5.0)
Alkaline Phosphatase: 128 U/L — ABNORMAL HIGH (ref 38–126)
Anion gap: 5 (ref 5–15)
BUN: 18 mg/dL (ref 8–23)
CO2: 25 mmol/L (ref 22–32)
Calcium: 6.8 mg/dL — ABNORMAL LOW (ref 8.9–10.3)
Chloride: 110 mmol/L (ref 98–111)
Creatinine, Ser: 1.31 mg/dL — ABNORMAL HIGH (ref 0.44–1.00)
GFR calc Af Amer: 48 mL/min — ABNORMAL LOW (ref >60)
GFR calc non Af Amer: 41 mL/min — ABNORMAL LOW (ref >60)
Glucose, Bld: 126 mg/dL — ABNORMAL HIGH (ref 70–99)
Potassium: 3.6 mmol/L (ref 3.5–5.1)
Sodium: 140 mmol/L (ref 135–145)
Total Bilirubin: 0.2 mg/dL — ABNORMAL LOW (ref 0.3–1.2)
Total Protein: 4.7 g/dL — ABNORMAL LOW (ref 6.5–8.1)

## 2019-07-25 LAB — CBC
HCT: 26.7 % — ABNORMAL LOW (ref 36.0–46.0)
Hemoglobin: 8.1 g/dL — ABNORMAL LOW (ref 12.0–15.0)
MCH: 28 pg (ref 26.0–34.0)
MCHC: 30.3 g/dL (ref 30.0–36.0)
MCV: 92.4 fL (ref 80.0–100.0)
Platelets: 363 10*3/uL (ref 150–400)
RBC: 2.89 MIL/uL — ABNORMAL LOW (ref 3.87–5.11)
RDW: 21 % — ABNORMAL HIGH (ref 11.5–15.5)
WBC: 8.7 10*3/uL (ref 4.0–10.5)
nRBC: 0 % (ref 0.0–0.2)

## 2019-07-25 LAB — GLUCOSE, CAPILLARY
Glucose-Capillary: 111 mg/dL — ABNORMAL HIGH (ref 70–99)
Glucose-Capillary: 119 mg/dL — ABNORMAL HIGH (ref 70–99)
Glucose-Capillary: 134 mg/dL — ABNORMAL HIGH (ref 70–99)
Glucose-Capillary: 150 mg/dL — ABNORMAL HIGH (ref 70–99)

## 2019-07-25 NOTE — Progress Notes (Signed)
[place] for Infectious Disease  Date of Admission:  07/04/2019     Total days of antibiotics 21         ASSESSMENT:  Ms. Stepney continues to have improvement in her abdominal symptoms s/p percutaneous drain placement and now advancing diet. She does continue to have diarrhea and unclear frequency given rectal tube. Will continue to treat abdominal infection with Unasyn for an additional 3 days which will total 8 days of therapy and now with source control. Will also continue fidaxomicin for C. Diff infection given the need for continued rectal tube and work towards taper near completion of therapy.   PLAN:  1. Continue Unasyn and Fidaxomicin. End date for Unasyn placed.  2. Monitor stool output 3. Drain care per IR.   Principal Problem:   Acalculous cholecystitis Active Problems:   Clostridium difficile diarrhea   Uncontrolled type 2 diabetes mellitus with stage 3 chronic kidney disease (HCC)   Morbid obesity ([place])   Hypothyroidism   Depression   CKD (chronic kidney disease), stage III (HCC)   Chronic diastolic heart failure (HCC)   Essential hypertension   AKI (acute kidney injury) ([place])   AMS (altered mental status)   Encephalopathy due to COVID-19 virus   Healing pressure injury, stage 2 (HCC)   Lower abdominal pain, unspecified   Leukocytosis   Anasarca   . aspirin  81 mg Oral Daily  . buPROPion  100 mg Oral BID  . Chlorhexidine Gluconate Cloth  6 each Topical Daily  . DULoxetine  30 mg Oral BID  . enoxaparin (LOVENOX) injection  60 mg Subcutaneous Q24H  . feeding supplement (ENSURE ENLIVE)  237 mL Oral TID BM  . feeding supplement (PRO-STAT SUGAR FREE 64)  30 mL Oral TID WC  . fidaxomicin  200 mg Oral BID  . gabapentin  300 mg Oral TID  . Gerhardt's butt cream  1 application Topical TID  . insulin aspart  0-15 Units Subcutaneous Q6H  . levothyroxine  88 mcg Oral QAC breakfast  . midodrine  10 mg Oral BID WC  . multivitamin with minerals  1 tablet Oral  Daily  . rosuvastatin  5 mg Oral Daily  . sodium chloride flush  10-40 mL Intracatheter Q12H  . sodium chloride flush  5 mL Intracatheter Q8H  . sucralfate  1 g Oral TID WC & HS    SUBJECTIVE:  Afebrile overnight with improving leukocytosis. Abdominal pain and nausea improved and able to tolerate increased oral intake. Currently has rectal tube and unsure if or how many bowel movements she has had today.    Allergies  Allergen Reactions  . Morphine And Related Other (See Comments)    Behavioral Issues  . Sulfa Antibiotics      Review of Systems: Review of Systems  Constitutional: Negative for chills, fever and weight loss.  Respiratory: Negative for cough, shortness of breath and wheezing.   Cardiovascular: Negative for chest pain and leg swelling.  Gastrointestinal: Positive for diarrhea. Negative for abdominal pain, constipation, nausea and vomiting.  Skin: Negative for rash.      OBJECTIVE: Vitals:   07/24/19 0403 07/24/19 1418 07/24/19 1940 07/25/19 0403  BP: 119/68 114/63 120/81 113/68  Pulse: 100 (!) 101 93 100  Resp: 16 18 16 16   Temp: (!) 97.5 F (36.4 C) 97.9 F (36.6 C) (!) 97.4 F (36.3 C) (!) 97.5 F (36.4 C)  TempSrc: Oral Oral Oral Oral  SpO2: 95% 95% 95% 94%  Weight:  Height:       Body mass index is 48.04 kg/m.  Physical Exam Constitutional:      General: She is not in acute distress.    Appearance: She is well-developed. She is obese.     Comments: Lying in bed with head of bed elevated; pleasant.   Cardiovascular:     Rate and Rhythm: Normal rate and regular rhythm.     Heart sounds: Normal heart sounds.  Pulmonary:     Effort: Pulmonary effort is normal.     Breath sounds: Normal breath sounds.  Abdominal:     General: There is no distension.     Palpations: There is no mass.     Tenderness: There is no guarding.     Comments: Percutaneous drain present and patent. There is biliary drainage present.   Genitourinary:     Comments: Rectal tube is patent with liquid stool in the bag.  Skin:    General: Skin is warm and dry.  Neurological:     Mental Status: She is alert and oriented to person, place, and time.  Psychiatric:        Behavior: Behavior normal.        Thought Content: Thought content normal.        Judgment: Judgment normal.     Lab Results Lab Results  Component Value Date   WBC 8.7 07/25/2019   HGB 8.1 (L) 07/25/2019   HCT 26.7 (L) 07/25/2019   MCV 92.4 07/25/2019   PLT 363 07/25/2019    Lab Results  Component Value Date   CREATININE 1.31 (H) 07/25/2019   BUN 18 07/25/2019   NA 140 07/25/2019   K 3.6 07/25/2019   CL 110 07/25/2019   CO2 25 07/25/2019    Lab Results  Component Value Date   ALT 12 07/25/2019   AST 17 07/25/2019   ALKPHOS 128 (H) 07/25/2019   BILITOT 0.2 (L) 07/25/2019     Microbiology: Recent Results (from the past 240 hour(s))  Aerobic/Anaerobic Culture (surgical/deep wound)     Status: None (Preliminary result)   Collection Time: 07/23/19  5:02 PM   Specimen: BILE  Result Value Ref Range Status   Specimen Description BILE  Final   Special Requests NONE  Final   Gram Stain NO WBC SEEN RARE GRAM NEGATIVE RODS   Final   Culture   Final    CULTURE REINCUBATED FOR BETTER GROWTH Performed at [facility], [place] [address], [place], [place] [address]    Report Status PENDING  Incomplete  Culture, body fluid-bottle     Status: None (Preliminary result)   Collection Time: 07/23/19  5:55 PM   Specimen: Pleura  Result Value Ref Range Status   Specimen Description PLEURAL FLUID  Final   Special Requests   Final    BOTTLES DRAWN AEROBIC AND ANAEROBIC Blood Culture adequate volume   Culture   Final    NO GROWTH 2 DAYS Performed at [facility], [address], [place], [place] [address]    Report Status PENDING  Incomplete  Gram stain     Status: None   Collection Time: 07/23/19  5:55 PM   Specimen: Pleura  Result Value Ref Range Status    Specimen Description PLEURAL FLUID  Final   Special Requests NONE  Final   Gram Stain   Final    FEW WBC PRESENT, PREDOMINANTLY PMN NO ORGANISMS SEEN Performed at [facility], [place] [address],  [place], [place] [address]    Report Status 07/24/2019 FINAL  Final     Terri Piedra, NP [place] [facility] [phone] Pager  07/25/2019  12:11 PM

## 2019-07-25 NOTE — Consult Note (Signed)
WOC consulted for same issues as last Friday. See my partners notes and I have updated orders for the patient as well.   Discussed POC with patient and bedside nurse.  Re consult if needed, will not follow at this time. Thanks  Analayah Brooke [employer], RN,CWOCN, CNS, [place] [phone])

## 2019-07-25 NOTE — Care Management Important Message (Signed)
Important Message  Patient Details  Name: Alice Kemp MRN: [id number] Date of Birth: 07/26/1951   Medicare Important Message Given:  Yes     [name] 07/25/2019, 3:30 PM    PATIENT HAS CONTACT PRECAUTION, IM LEFT WITH NURSE FOR PATIENT

## 2019-07-25 NOTE — Progress Notes (Signed)
PROGRESS NOTE    Alice Kemp  [id number]  DOB: [birth year]  DOA: 07/04/2019 PCP: [name], MD  Brief Narrative:  68 y.o.Fwith obesity,hypothyroidism, morbid obesity, dyslipidemia, DM, chronic diastolic heart failure, CKD stage III, COVID infection in June, was admitted from home on 8/18-8/30 for spinal stenosis. She underwent lumbar laminectomy and microdiscectomy on 8/24 and was discharged to SNF. This hospitalization was complicated by COVID positivity on restest, also developed abdominal wound infection/cellulitis.  She presented this time on 9/28  with AMS/diarrhea/ falls at [facility]. She was again COVID positive and admitted to [facility], ultimately found to have Cdiff with associated dehydration..After treatment at [facility] with oral vancomycin, she developed new acute cholecystitis by CT prompting transfer to [facility]. She has since been seen by GS and is s/p percutaneous drain, remains on IV unasyn. She is also on Fidoxamin and flagyl per ID for C.diff. Home lasix which was held initially has been resumed in concern for fluid overload. Creatinine was upto 1.7 before reinitiating Lasix and fortunately improving (1.3) since initiated on diuretics.Her albumin level was significantly low at 1.0 making her vulnerable to 3rd spacing. ALP down trending (349-->123), leucocytosis resolved (20k-->8.7k)   Subjective:  Patient resting comfortably. Being changed by nurses when seen this evening. Saturating well on RA.  Still has rectal tube with liquid green stool/PCT drain with bilious output  Objective: Vitals:   07/24/19 0403 07/24/19 1418 07/24/19 1940 07/25/19 0403  BP: 119/68 114/63 120/81 113/68  Pulse: 100 (!) 101 93 100  Resp: 16 18 16 16   Temp: (!) 97.5 F (36.4 C) 97.9 F (36.6 C) (!) 97.4 F (36.3 C) (!) 97.5 F (36.4 C)  TempSrc: Oral Oral Oral Oral  SpO2: 95% 95% 95% 94%  Weight:      Height:        Intake/Output Summary (Last 24 hours) at 07/25/2019 0855 Last  data filed at 07/25/2019 0655 Gross per 24 hour  Intake 1310 ml  Output 1575 ml  Net -265 ml   Filed Weights   07/21/19 0500 07/22/19 0500 07/23/19 0500  Weight: 128.6 kg 128.6 kg 127 kg    Physical Examination:  General exam: Appears calm and comfortable  Respiratory system: Clear to auscultation. Respiratory effort normal. Cardiovascular system: S1 & S2 heard, RRR. No JVD, murmurs, rubs, gallops or clicks. No pedal edema. Gastrointestinal system: Abdomen obese, RUQ drain in place, is nondistended, soft and nontender. No organomegaly or masses felt. Normal bowel sounds heard.+rectal tube in place Central nervous system: Alert and oriented. No focal neurological deficits. Extremities: Symmetric 5 x 5 power. Skin: stage 2 coccygeal decubitus Psychiatry: Judgement and insight appear normal. Mood & affect appropriate.     Data Reviewed: I have personally reviewed following labs and imaging studies  CBC: Recent Labs  Lab 07/19/19 0615 07/20/19 0445 07/21/19 0355 07/23/19 0359 07/24/19 0853 07/25/19 0425  WBC 6.4 11.8* 20.1* 16.1* 10.5 8.7  NEUTROABS 2.6 7.4 13.5* 10.8* 9.0*  --   HGB 8.4* 8.4* 8.9* 8.1* 8.0* 8.1*  HCT 28.0* 27.9* 29.6* 25.9* 26.8* 26.7*  MCV 92.1 90.6 91.6 90.9 93.4 92.4  PLT 284 305 339 375 351 [id number]   Basic Metabolic Panel: Recent Labs  Lab 07/19/19 0615 07/20/19 0445 07/21/19 0355 07/23/19 0359 07/24/19 0853 07/25/19 0425  NA 138 138 134* 137 138 140  K 3.8 4.3 4.2 4.3 4.0 3.6  CL 106 108 102 107 107 110  CO2 26 25 25 22 24 25   GLUCOSE 74  104* 99 128* 105* 126*  BUN 17 13 13 20 19 18   CREATININE 0.87 0.85 1.09* 1.67* 1.40* 1.31*  CALCIUM 7.3* 7.2* 7.3* 7.1* 7.0* 6.8*  MG 1.6* 1.7 1.7  --   --   --    GFR: Estimated Creatinine Clearance: 53.5 mL/min (A) (by C-G formula based on SCr of 1.31 mg/dL (H)). Liver Function Tests: Recent Labs  Lab 07/20/19 0445 07/21/19 0355 07/23/19 0359 07/24/19 0853 07/25/19 0425  AST 26 24 17 17 17   ALT  18 17 13 12 12   ALKPHOS 202* 188* 159* 140* 128*  BILITOT 0.2* 0.5 0.5 0.8 0.2*  PROT 4.6* 4.8* 4.5* 4.7* 4.7*  ALBUMIN 1.3* 1.3* 1.1* 1.1* 1.0*   No results for input(s): LIPASE, AMYLASE in the last 168 hours. No results for input(s): AMMONIA in the last 168 hours. Coagulation Profile: Recent Labs  Lab 07/23/19 1420  INR 1.4*   Cardiac Enzymes: No results for input(s): CKTOTAL, CKMB, CKMBINDEX, TROPONINI in the last 168 hours. BNP (last 3 results) No results for input(s): PROBNP in the last 8760 hours. HbA1C: No results for input(s): HGBA1C in the last 72 hours. CBG: Recent Labs  Lab 07/24/19 1101 07/24/19 1624 07/24/19 1942 07/25/19 0041 07/25/19 0407  GLUCAP 93 132* 115* 119* 111*   Lipid Profile: No results for input(s): CHOL, HDL, LDLCALC, TRIG, CHOLHDL, LDLDIRECT in the last 72 hours. Thyroid Function Tests: No results for input(s): TSH, T4TOTAL, FREET4, T3FREE, THYROIDAB in the last 72 hours. Anemia Panel: No results for input(s): VITAMINB12, FOLATE, FERRITIN, TIBC, IRON, RETICCTPCT in the last 72 hours. Sepsis Labs: No results for input(s): PROCALCITON, LATICACIDVEN in the last 168 hours.  Recent Results (from the past 240 hour(s))  Aerobic/Anaerobic Culture (surgical/deep wound)     Status: None (Preliminary result)   Collection Time: 07/23/19  5:02 PM   Specimen: BILE  Result Value Ref Range Status   Specimen Description BILE  Final   Special Requests NONE  Final   Gram Stain NO WBC SEEN RARE GRAM NEGATIVE RODS   Final   Culture   Final    CULTURE REINCUBATED FOR BETTER GROWTH Performed at [facility], [address] [place], [place] [address]    Report Status PENDING  Incomplete  Culture, body fluid-bottle     Status: None (Preliminary result)   Collection Time: 07/23/19  5:55 PM   Specimen: Pleura  Result Value Ref Range Status   Specimen Description PLEURAL FLUID  Final   Special Requests   Final    BOTTLES DRAWN AEROBIC AND ANAEROBIC  Blood Culture adequate volume   Culture   Final    NO GROWTH < 12 HOURS Performed at [facility], [place] [address], [place], [place] [address]    Report Status PENDING  Incomplete  Gram stain     Status: None   Collection Time: 07/23/19  5:55 PM   Specimen: Pleura  Result Value Ref Range Status   Specimen Description PLEURAL FLUID  Final   Special Requests NONE  Final   Gram Stain   Final    FEW WBC PRESENT, PREDOMINANTLY PMN NO ORGANISMS SEEN Performed at [facility], [place] [address], [place],  [address]    Report Status 07/24/2019 FINAL  Final      Radiology Studies: Ir Perc Cholecystostomy  Result Date: 07/23/2019 INDICATION: 68 year old with acute cholecystitis and recent COVID-19 infection. Patient is not a surgical candidate at this time. Plan for percutaneous cholecystostomy tube  placement. EXAM: PERCUTANEOUS CHOLECYSTOSTOMY TUBE PLACEMENT WITH ULTRASOUND AND FLUOROSCOPIC GUIDANCE ULTRASOUND-GUIDED PARACENTESIS MEDICATIONS: Moderate sedation ANESTHESIA/SEDATION: Moderate (conscious) sedation was employed during this procedure. A total of Versed 1.0 mg and Fentanyl 50 mcg was administered intravenously. Moderate Sedation Time: 23 minutes. The patient's level of consciousness and vital signs were monitored continuously by radiology nursing throughout the procedure under my direct supervision. FLUOROSCOPY TIME:  Fluoroscopy Time: 36 seconds, 4 mGy COMPLICATIONS: None immediate. PROCEDURE: Informed written consent was obtained from the patient after a thorough discussion of the procedural risks, benefits and alternatives. All questions were addressed. Maximal Sterile Barrier Technique was utilized including caps, mask, sterile gowns, sterile gloves, sterile drape, hand hygiene and skin antiseptic. A timeout was performed prior to the initiation of the procedure. Patient was placed supine. The right abdomen was prepped and draped in sterile fashion. Ultrasound was used to  identify a distended gallbladder and perihepatic ascites. Skin was anesthetized with 1% lidocaine. Using ultrasound guidance, a Yueh catheter was directed into the perihepatic fluid adjacent to the gallbladder. Yellow clear fluid was aspirated. Approximately 300 mL of fluid was removed from the perihepatic space. Ultrasound was used to direct a 21 gauge needle into the distended gallbladder. 0.018 wire was advanced into the gallbladder under ultrasound guidance. An Accustick dilator set was placed. The tract was dilated over a J wire to accommodate a 10.2 [place] multipurpose drain. Greater than 150 mL of dark cloudy thick bilious fluid was aspirated from the gallbladder. Gallbladder was decompressed at the end of the procedure. Catheter was sutured to skin and attached to gravity bag. Fluoroscopic and ultrasound images were taken and saved for documentation. FINDINGS: Approximately 300 mL of clear yellow ascites was removed from the perihepatic space. 10.2 French drain was placed in the gallbladder and the gallbladder was successfully decompressed. IMPRESSION: 1. Successful percutaneous cholecystostomy tube placement with ultrasound and fluoroscopic guidance. Fluid was sent for culture. 2. Ultrasound-guided paracentesis. Ascites fluid was sent for culture. Electronically Signed   By: Markus Daft M.D.   On: 07/23/2019 17:41   Ir Paracentesis  Result Date: 07/23/2019 INDICATION: 68 year old with acute cholecystitis and recent COVID-19 infection. Patient is not a surgical candidate at this time. Plan for percutaneous cholecystostomy tube placement. EXAM: PERCUTANEOUS CHOLECYSTOSTOMY TUBE PLACEMENT WITH ULTRASOUND AND FLUOROSCOPIC GUIDANCE ULTRASOUND-GUIDED PARACENTESIS MEDICATIONS: Moderate sedation ANESTHESIA/SEDATION: Moderate (conscious) sedation was employed during this procedure. A total of Versed 1.0 mg and Fentanyl 50 mcg was administered intravenously. Moderate Sedation Time: 23 minutes. The patient's level  of consciousness and vital signs were monitored continuously by radiology nursing throughout the procedure under my direct supervision. FLUOROSCOPY TIME:  Fluoroscopy Time: 36 seconds, 4 mGy COMPLICATIONS: None immediate. PROCEDURE: Informed written consent was obtained from the patient after a thorough discussion of the procedural risks, benefits and alternatives. All questions were addressed. Maximal Sterile Barrier Technique was utilized including caps, mask, sterile gowns, sterile gloves, sterile drape, hand hygiene and skin antiseptic. A timeout was performed prior to the initiation of the procedure. Patient was placed supine. The right abdomen was prepped and draped in sterile fashion. Ultrasound was used to identify a distended gallbladder and perihepatic ascites. Skin was anesthetized with 1% lidocaine. Using ultrasound guidance, a Yueh catheter was directed into the perihepatic fluid adjacent to the gallbladder. Yellow clear fluid was aspirated. Approximately 300 mL of fluid was removed from the perihepatic space. Ultrasound was used to direct a 21 gauge needle into the distended gallbladder. 0.018 wire was advanced into the gallbladder under ultrasound guidance.  An Accustick dilator set was placed. The tract was dilated over a J wire to accommodate a 10.2 [place] multipurpose drain. Greater than 150 mL of dark cloudy thick bilious fluid was aspirated from the gallbladder. Gallbladder was decompressed at the end of the procedure. Catheter was sutured to skin and attached to gravity bag. Fluoroscopic and ultrasound images were taken and saved for documentation. FINDINGS: Approximately 300 mL of clear yellow ascites was removed from the perihepatic space. 10.2 French drain was placed in the gallbladder and the gallbladder was successfully decompressed. IMPRESSION: 1. Successful percutaneous cholecystostomy tube placement with ultrasound and fluoroscopic guidance. Fluid was sent for culture. 2.  Ultrasound-guided paracentesis. Ascites fluid was sent for culture. Electronically Signed   By: Markus Daft M.D.   On: 07/23/2019 17:41        Scheduled Meds:  aspirin  81 mg Oral Daily   buPROPion  100 mg Oral BID   Chlorhexidine Gluconate Cloth  6 each Topical Daily   DULoxetine  30 mg Oral BID   enoxaparin (LOVENOX) injection  60 mg Subcutaneous Q24H   feeding supplement (ENSURE ENLIVE)  237 mL Oral TID BM   feeding supplement (PRO-STAT SUGAR FREE 64)  30 mL Oral TID WC   fidaxomicin  200 mg Oral BID   gabapentin  300 mg Oral TID   Gerhardt's butt cream  1 application Topical TID   insulin aspart  0-15 Units Subcutaneous Q6H   levothyroxine  88 mcg Oral QAC breakfast   midodrine  10 mg Oral BID WC   multivitamin with minerals  1 tablet Oral Daily   rosuvastatin  5 mg Oral Daily   sodium chloride flush  10-40 mL Intracatheter Q12H   sodium chloride flush  5 mL Intracatheter Q8H   sucralfate  1 g Oral TID WC & HS   Continuous Infusions:  ampicillin-sulbactam (UNASYN) IV 3 g (07/25/19 0354)    Assessment & Plan:    1. Acute acalculous cholecystitis: S/p percutaneous cholecystectomy tube, IR, 10/17. F/U biliary cx. On IV unasyn Day 5 (ID recommends 5-6 days course post drain placement) . Leucocytosis resolved.  On regular diet.  2. Severe C.diff colitis: Followed by ID. On Flagyl + fidoxomicin BID (10 day course). Diarrhea improving. Encourage mobility/frequent bed changes to avoid autoinoculation. Per ID , remove rectal tube.Limit Unasyn course to a short duration.WBC normalized now  3. Recent COVID infection: Positive COVID test results 2 months ago and 3 weeks ago. Not on any treatment currently. Prolonged hospitalization with prolonged debility.   4. Anasarca/Ascitis/acute on chronic diastolic CHF: likely 3rd spacing from severe hypoalbuminemia. Off IV fluids and resumed on lasix ( home dose held on admission)-titrate as needed.  5. AKI: creatinine  peaked to 2.0 in earlier hospital course likely due to dehydration, then normalized with IV fluids. It started to uptrend again to 1.7 past week -likely congested kidney this time in the setting of aggressive IV hydration. Now improving with lasix.   6. Hypothyroidism: Continue levothyroxine  7. ?Orthostatic hypotension: patient on midodrine. SBP 110s.   8.  Diabetes with neuropathy: on SSI/neurontin. On asa as ?preventive meaure  9. Depression: Continue Well butrin, duloxetine  10. Normocytic Anemia : Chronic GI losses vs AOCD. Stable around 8--8.4. Ferritin 141, last fe level 78 and TIBC 292, folate 9.7  in Aug, will repeat.   11. Severe PCM : Albumin level too low at 1.0. Dietery evaluation. Not sure if good candidate for TPN given ongoing infections.  12. Stage  II coccyx pressure injury, not POA; local wound care  DVT prophylaxis: lovenox Code Status: DNR Family / Patient Communication:  Disposition Plan: TBD     LOS: 21 days    Time spent: 47 minutes    Guilford Shi, MD [facility] Pager [phone]  If 7PM-7AM, please contact night-coverage [email or web address] Password [email or web address] 07/25/2019, 8:55 AM

## 2019-07-25 NOTE — Final Consult Note (Signed)
Consultant Final Sign-Off Note    Assessment/Final recommendations  Alice Kemp is a 68 y.o. female followed by me for cholecystitis:   -[name] and CT concerning for cholecystitis -HIDA did not show filling of gallbladder, morphine was unable to be administered -S/p percutaneous cholecystectomy tube, IR, 10/17 - tolerated clears, advance diet to HH/carb mod  Wound care (if applicable): per IR   Diet at discharge: per primary team   Activity at discharge: per primary team   Follow-up appointment:  6-8 weeks with Dr. Redmond Pulling   Pending results:  Unresulted Labs (From admission, onward)    Start     Ordered   07/25/19 0500  CBC  Daily,   R    Question:  Specimen collection method  Answer:  IV Team=IV Team collect   07/24/19 1119   07/25/19 0500  Comprehensive metabolic panel  Daily,   R    Question:  Specimen collection method  Answer:  IV Team=IV Team collect   07/24/19 1119   07/23/19 1834  Body fluid culture  Once,   R    Comments: Ascites from paracentesis   Question:  Are there also cytology or pathology orders on this specimen?  Answer:  No   07/23/19 1833           Medication recommendations: antibiotics for 7-10 days   Other recommendations:    Thank you for allowing [name] to participate in the care of your patient!  Please consult [name] again if you have further needs for your patient.  Kalman Drape 07/25/2019 10:22 AM    Subjective   CC; no complaints  Pt denies abdominal pain, nausea or vomiting. No issues overnight.   Objective  Vital signs in last 24 hours: Temp:  [97.4 F (36.3 C)-97.9 F (36.6 C)] 97.5 F (36.4 C) (10/19 0403) Pulse Rate:  [93-101] 100 (10/19 0403) Resp:  [16-18] 16 (10/19 0403) BP: (113-120)/(63-81) 113/68 (10/19 0403) SpO2:  [94 %-95 %] 94 % (10/19 0403)  PE: Gen: Alert, NAD, pleasant, cooperative Pulm:Rate andeffort normal Abd: Soft, ND,no HSM, obese, no TTP, no peritonitis, perc cholecystectomy drain with  yellow/green fluid Skin: no rashes noted, warm and dry  Pertinent labs and Studies: Recent Labs    07/23/19 0359 07/24/19 0853 07/25/19 0425  WBC 16.1* 10.5 8.7  HGB 8.1* 8.0* 8.1*  HCT 25.9* 26.8* 26.7*   BMET Recent Labs    07/24/19 0853 07/25/19 0425  NA 138 140  K 4.0 3.6  CL 107 110  CO2 24 25  GLUCOSE 105* 126*  BUN 19 18  CREATININE 1.40* 1.31*  CALCIUM 7.0* 6.8*   No results for input(s): LABURIN in the last 72 hours. Results for orders placed or performed during the hospital encounter of 07/04/19  C Difficile Quick Screen w PCR reflex     Status: Abnormal   Collection Time: 07/04/19  4:54 AM   Specimen: Nasopharyngeal Swab; Stool  Result Value Ref Range Status   C Diff antigen POSITIVE (A) NEGATIVE Final   C Diff toxin NEGATIVE NEGATIVE Final   C Diff interpretation Results are indeterminate. See PCR results.  Final    Comment: Performed at [facility], [place] [address] [place], [place] [address]  SARS Coronavirus 2 [facility] order, Performed in [facility] hospital lab) Nasopharyngeal Nasopharyngeal Swab     Status: Abnormal   Collection Time: 07/04/19  4:54 AM   Specimen: Nasopharyngeal Swab  Result Value Ref Range Status   SARS Coronavirus 2  POSITIVE (A) NEGATIVE Final    Comment: RESULT CALLED TO, READ BACK BY AND VERIFIED WITH: A. DENNIS,RN [id number] 07/04/2019 T. TYSOR (NOTE) If result is NEGATIVE SARS-CoV-2 target nucleic acids are NOT DETECTED. The SARS-CoV-2 RNA is generally detectable in upper and lower  respiratory specimens during the acute phase of infection. The lowest  concentration of SARS-CoV-2 viral copies this assay can detect is 250  copies / mL. A negative result does not preclude SARS-CoV-2 infection  and should not be used as the sole basis for treatment or other  patient management decisions.  A negative result may occur with  improper specimen collection / handling, submission of specimen other  than nasopharyngeal swab,  presence of viral mutation(s) within the  areas targeted by this assay, and inadequate number of viral copies  (<250 copies / mL). A negative result must be combined with clinical  observations, patient history, and epidemiological information. If result is POSITIVE SARS-CoV-2 target nucleic acids are DETECTED.  The SARS-CoV-2 RNA is generally detectable in upper and lower  respiratory specimens during the acute phase of infection.  Positive  results are indicative of active infection with SARS-CoV-2.  Clinical  correlation with patient history and other diagnostic information is  necessary to determine patient infection status.  Positive results do  not rule out bacterial infection or co-infection with other viruses. If result is PRESUMPTIVE POSTIVE SARS-CoV-2 nucleic acids MAY BE PRESENT.   A presumptive positive result was obtained on the submitted specimen  and confirmed on repeat testing.  While 2019 novel coronavirus  (SARS-CoV-2) nucleic acids may be present in the submitted sample  additional confirmatory testing may be necessary for epidemiological  and / or clinical management purposes  to differentiate between  SARS-CoV-2 and other Sarbecovirus currently known to infect humans.  If clinically indicated additional testing with an alternate test  methodology [phone])  is advised. The SARS-CoV-2 RNA is generally  detectable in upper and lower respiratory specimens during the acute  phase of infection. The expected result is Negative. Fact Sheet for Patients:  StrictlyIdeas.no Fact Sheet for Healthcare Providers: BankingDealers.co.za This test is not yet approved or cleared by the [place] FDA and has been authorized for detection and/or diagnosis of SARS-CoV-2 by FDA under an Emergency Use Authorization (EUA).  This EUA will remain in effect (meaning this test can be used) for the duration of the COVID-19 declaration under  Section 564(b)(1) of the Act, 21 U.S.C. section 360bbb-3(b)(1), unless the authorization is terminated or revoked sooner. Performed at [facility], [place] [address] [place], [place] [address]   C. Diff by PCR, Reflexed     Status: Abnormal   Collection Time: 07/04/19  4:54 AM  Result Value Ref Range Status   Toxigenic C. Difficile by PCR POSITIVE (A) NEGATIVE Final    Comment: Positive for toxigenic C. difficile with little to no toxin production. Only treat if clinical presentation suggests symptomatic illness. Performed at [facility], [place] [address], [place], [place] [address]   Blood culture (routine x 2)     Status: None   Collection Time: 07/04/19  5:20 AM   Specimen: BLOOD  Result Value Ref Range Status   Specimen Description BLOOD LEFT ANTECUBITAL  Final   Special Requests   Final    BOTTLES DRAWN AEROBIC AND ANAEROBIC Blood Culture results may not be optimal due to an excessive volume of blood received in culture bottles   Culture   Final  NO GROWTH 5 DAYS Performed at [facility], [place] [address] [place], [place] [address]    Report Status 07/09/2019 FINAL  Final  Blood culture (routine x 2)     Status: None   Collection Time: 07/04/19  5:24 AM   Specimen: BLOOD LEFT HAND  Result Value Ref Range Status   Specimen Description BLOOD LEFT HAND  Final   Special Requests   Final    BOTTLES DRAWN AEROBIC ONLY Blood Culture adequate volume   Culture   Final    NO GROWTH 5 DAYS Performed at [facility], [place] [address] [place], [place] [address]    Report Status 07/09/2019 FINAL  Final  MRSA PCR Screening     Status: None   Collection Time: 07/07/19 10:22 AM   Specimen: Nasopharyngeal  Result Value Ref Range Status   MRSA by PCR NEGATIVE NEGATIVE Final    Comment:        The GeneXpert MRSA Assay (FDA approved for NASAL specimens only), is one component of a comprehensive MRSA colonization surveillance program. It is not intended to  diagnose MRSA infection nor to guide or monitor treatment for MRSA infections. Performed at [facility], [place] [address], [place], [place] [address]   Aerobic/Anaerobic Culture (surgical/deep wound)     Status: None (Preliminary result)   Collection Time: 07/23/19  5:02 PM   Specimen: BILE  Result Value Ref Range Status   Specimen Description BILE  Final   Special Requests NONE  Final   Gram Stain NO WBC SEEN RARE GRAM NEGATIVE RODS   Final   Culture   Final    CULTURE REINCUBATED FOR BETTER GROWTH Performed at [facility], [place] [address], [place], [place] [address]    Report Status PENDING  Incomplete  Culture, body fluid-bottle     Status: None (Preliminary result)   Collection Time: 07/23/19  5:55 PM   Specimen: Pleura  Result Value Ref Range Status   Specimen Description PLEURAL FLUID  Final   Special Requests   Final    BOTTLES DRAWN AEROBIC AND ANAEROBIC Blood Culture adequate volume   Culture   Final    NO GROWTH 2 DAYS Performed at [facility], [address], [place], [place] [address]    Report Status PENDING  Incomplete  Gram stain     Status: None   Collection Time: 07/23/19  5:55 PM   Specimen: Pleura  Result Value Ref Range Status   Specimen Description PLEURAL FLUID  Final   Special Requests NONE  Final   Gram Stain   Final    FEW WBC PRESENT, PREDOMINANTLY PMN NO ORGANISMS SEEN Performed at [facility], [place] [address], [place], [place] [address]    Report Status 07/24/2019 FINAL  Final    Imaging: No results found.

## 2019-07-25 NOTE — NC FL2 (Addendum)
[place] LEVEL OF CARE SCREENING TOOL     IDENTIFICATION  Patient Name: Alice Kemp Birthdate: [birth year] Sex: female Admission Date (Current Location): 07/04/2019  [facility] and [place] Number:  Herbalist and Address:  [facility] [place]. [facility], [place] [address] [place], [facility])      Provider Number: [id number]  Attending Physician Name and Address:  Guilford Shi, MD [address], [place], [place], [address] Relative Name and Phone Number:  Marieka Belue [phone]), brother    Current Level of Care: Hospital Recommended Level of Care: [place] Prior Approval Number:    Date Approved/Denied:   PASRR Number: [id number] (good through 11/18//2020)  Discharge Plan: SNF    Current Diagnoses: Patient Active Problem List   Diagnosis Date Noted  . Acalculous cholecystitis   . Lower abdominal pain, unspecified   . Leukocytosis   . Clostridium difficile diarrhea   . Anasarca   . Healing pressure injury, stage 2 ([place]) 07/11/2019  . AMS (altered mental status) 07/04/2019  . Encephalopathy due to COVID-19 virus 07/04/2019  . HNP (herniated nucleus pulposus), lumbar 05/30/2019  . Cellulitis of abdominal wall 05/24/2019  . AKI (acute kidney injury) ([place]) 05/24/2019  . Hyponatremia 05/24/2019  . Anemia 05/24/2019  . Suspected COVID-19 virus infection 03/28/2019  . Essential hypertension 10/15/2017  . Pain in both lower extremities 01/27/2017  . Lumbar radiculopathy 06/02/2016  . Leg weakness, bilateral 06/02/2016  . Gout 11/29/2015  . Palpitations   . Chronic diastolic heart failure ([place]) 09/23/2013  . CKD (chronic kidney disease), stage III ([place])   . Depression 02/11/2013  . Benign neoplasm of colon 06/29/2012  . Cervical radiculopathy at C6 07/24/2011  . Morbid obesity ([place]) 02/14/2011  . Hypothyroidism 02/14/2011  . OBSTRUCTIVE SLEEP APNEA 12/23/2010  . PERICARDIAL EFFUSION  12/12/2010  . Uncontrolled type 2 diabetes mellitus with stage 3 chronic kidney disease ([place]) 12/02/2010  . EDEMA 12/02/2010  . DYSPNEA 12/02/2010    Orientation RESPIRATION BLADDER Height & Weight     Self, Time, Situation, Place  Normal Incontinent, External catheter Weight: 279 lb 15.8 oz (127 kg) Height:  5' 4.02" (162.6 cm)  BEHAVIORAL SYMPTOMS/MOOD NEUROLOGICAL BOWEL NUTRITION STATUS      Incontinent Diet(Please see DC Summary)  AMBULATORY STATUS COMMUNICATION OF NEEDS Skin   Extensive Assist Verbally Other (Comment), PU Stage and Appropriate Care(generalized ecchymosis; MASD on abdomen and perineum; cracking on abdomen; cellulitis on abdomen; stage 2 on coccyx with PRN foam dressing)   PU Stage 2 Dressing: (PRN foam dressing on coccyx)                   Personal Care Assistance Level of Assistance  Bathing, Feeding, Dressing Bathing Assistance: Maximum assistance Feeding assistance: Limited assistance Dressing Assistance: Maximum assistance     Functional Limitations Info  Sight, Hearing, Speech Sight Info: Adequate Hearing Info: Adequate Speech Info: Adequate    SPECIAL CARE FACTORS FREQUENCY  PT (By licensed PT), OT (By licensed OT)     PT Frequency: 5x week OT Frequency: 5x week            Contractures Contractures Info: Not present    Additional Factors Info  Code Status, Allergies, Isolation Precautions, Insulin Sliding Scale, Psychotropic Code Status Info: DNR Allergies Info: Morphine And Related, Sulfa Antibiotics Psychotropic Info: buPROPion (WELLBUTRIN SR) 12 hr tablet 100 mg 2x daily PO; DULoxetine (CYMBALTA) DR capsule 30 mg 2x daily PO Insulin Sliding  Scale Info: insulin aspart (novoLOG) injection 0-15 Units every 6 hrs Isolation Precautions Info: Airborne/Enteric Precautions     Current Medications (07/25/2019):  This is the current hospital active medication list Current Facility-Administered Medications  Medication Dose Route Frequency  Provider Last Rate Last Dose  . acetaminophen (TYLENOL) tablet 650 mg  650 mg Oral Q6H PRN Thurnell Lose, MD   650 mg at 07/24/19 1005  . alum & mag hydroxide-simeth (MAALOX/MYLANTA) 200-200-20 MG/5ML suspension 30 mL  30 mL Oral Q6H PRN Cristal Deer, MD   30 mL at 07/23/19 1954  . Ampicillin-Sulbactam (UNASYN) 3 g in sodium chloride 0.9 % 100 mL IVPB  3 g Intravenous Q6H [place], FNP 200 mL/hr at 07/25/19 1044 3 g at 07/25/19 1044  . aspirin chewable tablet 81 mg  81 mg Oral Daily Thurnell Lose, MD   81 mg at 07/25/19 1038  . buPROPion (WELLBUTRIN SR) 12 hr tablet 100 mg  100 mg Oral BID Thurnell Lose, MD   100 mg at 07/25/19 1038  . Chlorhexidine Gluconate Cloth 2 % PADS 6 each  6 each Topical Daily Thurnell Lose, MD   6 each at 07/25/19 1046  . DULoxetine (CYMBALTA) DR capsule 30 mg  30 mg Oral BID Thurnell Lose, MD   30 mg at 07/25/19 1039  . enoxaparin (LOVENOX) injection 60 mg  60 mg Subcutaneous Q24H Candiss Norse A, PA-C   60 mg at 07/25/19 1211  . feeding supplement (ENSURE ENLIVE) (ENSURE ENLIVE) liquid 237 mL  237 mL Oral TID BM Thurnell Lose, MD   237 mL at 07/25/19 1039  . feeding supplement (PRO-STAT SUGAR FREE 64) liquid 30 mL  30 mL Oral TID WC Thurnell Lose, MD   30 mL at 07/25/19 1213  . fidaxomicin (DIFICID) tablet 200 mg  200 mg Oral BID Carlyle Basques, MD   200 mg at 07/25/19 1038  . gabapentin (NEURONTIN) capsule 300 mg  300 mg Oral TID Thurnell Lose, MD   300 mg at 07/25/19 1038  . Gerhardt's butt cream 1 application  1 application Topical TID Thurnell Lose, MD   1 application at [id number] 1046  . insulin aspart (novoLOG) injection 0-15 Units  0-15 Units Subcutaneous Q6H Thurnell Lose, MD   2 Units at 07/24/19 1757  . levothyroxine (SYNTHROID) tablet 88 mcg  88 mcg Oral QAC breakfast Thurnell Lose, MD   88 mcg at 07/24/19 0610  . magic mouthwash w/lidocaine  10 mL Oral TID PRN Thurnell Lose, MD   10 mL at  07/24/19 1009  . midodrine (PROAMATINE) tablet 10 mg  10 mg Oral BID WC Thurnell Lose, MD   10 mg at 07/25/19 0814  . multivitamin with minerals tablet 1 tablet  1 tablet Oral Daily Thurnell Lose, MD   1 tablet at 07/25/19 1039  . ondansetron (ZOFRAN) injection 4 mg  4 mg Intravenous Q6H PRN Thurnell Lose, MD   4 mg at 07/22/19 1117  . phenol (CHLORASEPTIC) mouth spray 1 spray  1 spray Mouth/Throat PRN Thurnell Lose, MD      . promethazine (PHENERGAN) injection 12.5 mg  12.5 mg Intravenous Q6H PRN Thurnell Lose, MD   12.5 mg at 07/22/19 1624  . rosuvastatin (CRESTOR) tablet 5 mg  5 mg Oral Daily Thurnell Lose, MD   5 mg at 07/25/19 1038  . sodium chloride flush (NS) 0.9 % injection 10-40 mL  10-40 mL Intracatheter Q12H Thurnell Lose, MD   10 mL at 07/25/19 1039  . sodium chloride flush (NS) 0.9 % injection 5 mL  5 mL Intracatheter Q8H Markus Daft, MD   5 mL at 07/24/19 1258  . sucralfate (CARAFATE) 1 GM/10ML suspension 1 g  1 g Oral TID WC & HS Greer Pickerel, MD   1 g at 07/25/19 1213     Discharge Medications: Please see discharge summary for a list of discharge medications.  Relevant Imaging Results:  Relevant Lab Results:   Additional Information #SS [id number]; has perc drain to gravity  [employer], LCSWA

## 2019-07-25 NOTE — TOC Progression Note (Addendum)
Transition of Care [facility]) - Progression Note    Patient Details  Name: Alice Kemp MRN: [id number] Date of Birth: October 25, 1950  Transition of [facility]) CM/SW [place], [place] Phone Number: 07/25/2019, 1:14 PM  Clinical Narrative:    5:11pm- PASRR received [id number] (good through 11/18//2020),  1:14pm- CSW has initiated insurance approval through [employer]. Await update from MD after she rounds regarding stability. PASRR pending.   Expected Discharge Plan: Skilled Nursing Facility Barriers to Discharge: Ship broker, Continued Medical Work up, Environmental education officer)  Expected Discharge Plan and Services Expected Discharge Plan: [place] In-house Referral: Clinical Social Work Discharge Planning Services: NA Post Acute Care Choice: [place] Living arrangements for the past 2 months: [place] Expected Discharge Date: 07/07/19               DME Arranged: N/A DME Agency: NA HH Arranged: NA HH Agency: NA   Social Determinants of Health (SDOH) Interventions    Readmission Risk Interventions Readmission Risk Prevention Plan 07/05/2019  Transportation Screening Complete  PCP or Specialist Appt within 3-5 Days Complete  HRI or [place] Complete  Social Work Consult for [place] Planning/Counseling Complete  Palliative Care Screening Not Applicable  Medication Review Press photographer) Complete  Some recent data might be hidden

## 2019-07-25 NOTE — Progress Notes (Signed)
Physical Therapy Treatment Patient Details Name: Alice Kemp MRN: [id number] DOB: 1950-10-07 Today's Date: 07/25/2019    History of Present Illness 68 y/o female pt w/ hx of anxiety, arthritis, back pain, benign neoplasm of colon, breast abscess, cervical radiculopathy, CKD III, depression, diastolic HF, DM II, glaucoma, gout, HLD, morbid obesity, obstructive sleep apnea, pericardial effusion, pulmonary HTN, hx of COVID in 06/20. pt underwent decompressive laminectomy L3 partial inferior laminectomy L2 and microscopic discectomy from the left and foraminotomies of the left L3 nerve root on 05/30/19. She had a fall on 07/02/2019, admitted with AMS, hypotension; C-diff.     PT Comments    Pt admitted with above diagnosis. Pt was unable to scoot to chair with +1 assist and could not get help to come in and assist PT.  Pt did sit EOB 10 minutes with min guard to min assist.  Pt fatigues quickly.  Asked nurse to get pt OOB later today with MAxi sky.   Pt currently with functional limitations due to the deficits listed below (see PT Problem List). Pt will benefit from skilled PT to increase their independence and safety with mobility to allow discharge to the venue listed below.     Follow Up Recommendations  SNF     Equipment Recommendations  None recommended by PT    Recommendations for Other Services       Precautions / Restrictions Precautions Precautions: Fall Restrictions Weight Bearing Restrictions: No    Mobility  Bed Mobility Overal bed mobility: Needs Assistance Bed Mobility: Rolling;Supine to Sit;Sit to Supine Rolling: Max assist Sidelying to sit: Max assist   Sit to supine: Max assist   General bed mobility comments: Pt needed max assist to roll and sit up.  used pad to get hips to EOB. Needed max assist for LEs to lide back down.  used bed for pt to scoot up in bed in trendelenburg.   Transfers Overall transfer level: Needs assistance   Transfers: Lateral/Scoot  Transfers          Lateral/Scoot Transfers: (unable as pt could not scoot with +1 asssit, nurse didnt com) General transfer comment: Called nurse to help with transfer however nurse did not come and pt fatigued therefore had to lie pt back down.   Ambulation/Gait             General Gait Details: unable to ambulate at this time   Stairs             Wheelchair Mobility    Modified Rankin (Stroke Patients Only)       Balance Overall balance assessment: Needs assistance Sitting-balance support: Feet supported;Bilateral upper extremity supported Sitting balance-Leahy Scale: Fair Sitting balance - Comments: supervision - minguard while seated EOB until pt fatigued and then she laid back on bed.  Had to help her back to sitting with max assist and max assist to lay her back down.  Sat a total of 10 min EOB.  Postural control: Posterior lean                                  Cognition Arousal/Alertness: Awake/alert Behavior During Therapy: WFL for tasks assessed/performed Overall Cognitive Status: Impaired/Different from baseline Area of Impairment: Orientation;Attention;Memory;Following commands;Safety/judgement;Awareness;Problem solving                 Orientation Level: Disoriented to;Time Current Attention Level: Focused Memory: Decreased recall of precautions;Decreased short-term memory Following Commands:  Follows one step commands with increased time Safety/Judgement: Decreased awareness of safety;Decreased awareness of deficits Awareness: Anticipatory Problem Solving: Slow processing        Exercises General Exercises - Lower Extremity Ankle Circles/Pumps: AROM;Both;10 reps;Seated Long Arc Quad: AROM;Both;10 reps;Seated    General Comments General comments (skin integrity, edema, etc.): VSS      Pertinent Vitals/Pain Pain Assessment: Faces Faces Pain Scale: Hurts little more Pain Location: sacral region Pain Descriptors /  Indicators: Discomfort;Grimacing;Sore Pain Intervention(s): Limited activity within patient's tolerance;Monitored during session;Repositioned    Home Living                      Prior Function            PT Goals (current goals can now be found in the care plan section) Acute Rehab PT Goals Patient Stated Goal: to get back home to her mom and dogs Progress towards PT goals: Progressing toward goals    Frequency    Min 2X/week      PT Plan Current plan remains appropriate    Co-evaluation PT/OT/SLP Co-Evaluation/Treatment: (for EOB or OOB)            AM-PAC PT "6 Clicks" Mobility   Outcome Measure  Help needed turning from your back to your side while in a flat bed without using bedrails?: A Lot Help needed moving from lying on your back to sitting on the side of a flat bed without using bedrails?: A Lot Help needed moving to and from a bed to a chair (including a wheelchair)?: Total Help needed standing up from a chair using your arms (e.g., wheelchair or bedside chair)?: Total Help needed to walk in hospital room?: Total Help needed climbing 3-5 steps with a railing? : Total 6 Click Score: 8    End of Session   Activity Tolerance: Patient tolerated treatment well Patient left: in bed;with call bell/phone within reach Nurse Communication: Mobility status;Need for lift equipment(asked nurse to get pt OOB with Maxi sky) PT Visit Diagnosis: Other abnormalities of gait and mobility (R26.89);History of falling (Z91.81)     Time: [id number] PT Time Calculation (min) (ACUTE ONLY): 23 min  Charges:  $Therapeutic Exercise: 8-22 mins $Therapeutic Activity: 8-22 mins                     [place] Pager:  [phone]  Office:  [place] 07/25/2019, 1:25 PM

## 2019-07-26 DIAGNOSIS — A0472 Enterocolitis due to Clostridium difficile, not specified as recurrent: Secondary | ICD-10-CM | POA: Diagnosis not present

## 2019-07-26 DIAGNOSIS — S30811A Abrasion of abdominal wall, initial encounter: Secondary | ICD-10-CM

## 2019-07-26 DIAGNOSIS — X58XXXA Exposure to other specified factors, initial encounter: Secondary | ICD-10-CM | POA: Diagnosis not present

## 2019-07-26 DIAGNOSIS — R601 Generalized edema: Secondary | ICD-10-CM | POA: Diagnosis not present

## 2019-07-26 DIAGNOSIS — K819 Cholecystitis, unspecified: Secondary | ICD-10-CM | POA: Diagnosis not present

## 2019-07-26 DIAGNOSIS — K810 Acute cholecystitis: Secondary | ICD-10-CM

## 2019-07-26 DIAGNOSIS — N179 Acute kidney failure, unspecified: Secondary | ICD-10-CM | POA: Diagnosis not present

## 2019-07-26 DIAGNOSIS — R410 Disorientation, unspecified: Secondary | ICD-10-CM | POA: Diagnosis not present

## 2019-07-26 LAB — COMPREHENSIVE METABOLIC PANEL
ALT: 10 U/L (ref 0–44)
AST: 15 U/L (ref 15–41)
Albumin: 1.1 g/dL — ABNORMAL LOW (ref 3.5–5.0)
Alkaline Phosphatase: 129 U/L — ABNORMAL HIGH (ref 38–126)
Anion gap: 5 (ref 5–15)
BUN: 15 mg/dL (ref 8–23)
CO2: 25 mmol/L (ref 22–32)
Calcium: 7.2 mg/dL — ABNORMAL LOW (ref 8.9–10.3)
Chloride: 110 mmol/L (ref 98–111)
Creatinine, Ser: 1.13 mg/dL — ABNORMAL HIGH (ref 0.44–1.00)
GFR calc Af Amer: 57 mL/min — ABNORMAL LOW (ref >60)
GFR calc non Af Amer: 50 mL/min — ABNORMAL LOW (ref >60)
Glucose, Bld: 131 mg/dL — ABNORMAL HIGH (ref 70–99)
Potassium: 3.5 mmol/L (ref 3.5–5.1)
Sodium: 140 mmol/L (ref 135–145)
Total Bilirubin: 0.4 mg/dL (ref 0.3–1.2)
Total Protein: 4.9 g/dL — ABNORMAL LOW (ref 6.5–8.1)

## 2019-07-26 LAB — CBC
HCT: 29.4 % — ABNORMAL LOW (ref 36.0–46.0)
Hemoglobin: 8.4 g/dL — ABNORMAL LOW (ref 12.0–15.0)
MCH: 27.5 pg (ref 26.0–34.0)
MCHC: 28.6 g/dL — ABNORMAL LOW (ref 30.0–36.0)
MCV: 96.1 fL (ref 80.0–100.0)
Platelets: 425 10*3/uL — ABNORMAL HIGH (ref 150–400)
RBC: 3.06 MIL/uL — ABNORMAL LOW (ref 3.87–5.11)
RDW: 21.4 % — ABNORMAL HIGH (ref 11.5–15.5)
WBC: 9.5 10*3/uL (ref 4.0–10.5)
nRBC: 0 % (ref 0.0–0.2)

## 2019-07-26 LAB — IRON AND TIBC
Iron: 33 ug/dL (ref 28–170)
Saturation Ratios: 31 % (ref 10.4–31.8)
TIBC: 106 ug/dL — ABNORMAL LOW (ref 250–450)
UIBC: 73 ug/dL

## 2019-07-26 LAB — GLUCOSE, CAPILLARY
Glucose-Capillary: 117 mg/dL — ABNORMAL HIGH (ref 70–99)
Glucose-Capillary: 117 mg/dL — ABNORMAL HIGH (ref 70–99)
Glucose-Capillary: 125 mg/dL — ABNORMAL HIGH (ref 70–99)
Glucose-Capillary: 155 mg/dL — ABNORMAL HIGH (ref 70–99)
Glucose-Capillary: 157 mg/dL — ABNORMAL HIGH (ref 70–99)

## 2019-07-26 LAB — VITAMIN B12: Vitamin B-12: 1401 pg/mL — ABNORMAL HIGH (ref 180–914)

## 2019-07-26 NOTE — Progress Notes (Signed)
Nutrition Follow-up  RD working remotely.  DOCUMENTATION CODES:   Morbid obesity  INTERVENTION:   -Continue MVI with minerals daily -Continue Ensure Enlive po TID, each supplement provides 350 kcal and 20 grams of protein -Continue 30 ml Prostat TID, each supplement provides 100 kcals and 15 grams protein -Continue Hormel Shake q AM, each supplement provides 520 kcals and 22 grams protein  NUTRITION DIAGNOSIS:   Inadequate oral intake related to acute illness(with poor appetite and difficulty chewing) as evidenced by meal completion < 50%.  Ongoing  GOAL:   Patient will meet greater than or equal to 90% of their needs  Progressing   MONITOR:   PO intake, Supplement acceptance, Labs, Skin  REASON FOR ASSESSMENT:   LOS    ASSESSMENT:   68 yo female admitted from SNF S/P fall, found to have C. Diff colitis. S/P back surgery in August, when she was incidentally found to have COVID-19. PMH includes hypothyroidism, morbid obesity, OSA, HLD, glaucoma, HF, DM, CKD III.  10/15- transferred from [facility] to [facility] 10/17- s/p percutaneous cholecystotomyy and paracentesis- revealed perihepatic ascites and distended gall bladder, 300 ml yellow fluid yielded, 150 ml drak cloduy bile from gallbladder, 10 F drain placed 10/18- rectal tube placed  Reviewed I/O's: -972 ml x 24 hours and +7.9 L since 07/12/19  UOP: 825 ml x 24 hours  Drain output: 125 ml x 24 hours  Rectal tube output: 150 ml x 24 hours  Per CWOCN note from 07/22/19, pt with stage II pressure injury to sacrum as well as MASD on buttocks and thighs due to fecal incontinence.   Attempted to speak with pt via phone, however, no answer.   Pt with variable oral intake. Noted meal completion 25-100%. Pt has been taking Prostat supplements. Acceptance of Ensure supplements is variable, however, pt accepted her last two doses.   Per CSW notes, plan for SNF once medically stable and bed placement has been found.   Labs  reviewed: CBG: 117-150 (inpatient orders for glycemic control are 0-15 units insulin aspart every 6 hours).   Diet Order:   Diet Order            Diet heart healthy/carb modified Room service appropriate? Yes; Fluid consistency: Thin  Diet effective now              EDUCATION NEEDS:   Not appropriate for education at this time  Skin:  Skin Assessment: Skin Integrity Issues: Skin Integrity Issues:: Incisions Stage II: coccyx Incisions: lower back Other: MASD to abdomen, perineum  Last BM:  07/25/19 (via rectal tube)  Height:   Ht Readings from Last 1 Encounters:  07/04/19 5' 4.02" (1.626 m)    Weight:   Wt Readings from Last 1 Encounters:  07/23/19 127 kg    Ideal Body Weight:  54.5 kg  BMI:  Body mass index is 48.04 kg/m.  Estimated Nutritional Needs:   Kcal:  2000-2200  Protein:  115-130 gm  Fluid:  >/= 1.8 L    Romari Gasparro A. Jimmye Norman, RD, LDN, [place] Registered Dietitian II Certified Diabetes Care and Education Specialist Pager: [phone] After hours Pager: [phone]

## 2019-07-26 NOTE — Progress Notes (Addendum)
[place] for Infectious Disease  Date of Admission:  07/04/2019     Total days of antibiotics 22         ASSESSMENT:  Alice Kemp continues to have improvement in her abdominal symptoms s/p percutaneous drain placement and now advancing diet as tolerated. She does continue to have loosen stools and unclear frequency given rectal tube. Will continue to treat abdominal infection with Unasyn for an additional 3 days which will total 8 days of therapy and now with source control. Will also continue fidaxomicin for C. Diff infection given the need for continued rectal tube and work towards taper near completion of therapy.   PLAN: 1. Continue Unasyn (end date placed) and continue Fidaxomicin 2. Monitor stool output 3. Drain care per IR.   Principal Problem:   Acalculous cholecystitis Active Problems:   Uncontrolled type 2 diabetes mellitus with stage 3 chronic kidney disease (HCC)   Morbid obesity (HCC)   Hypothyroidism   Depression   CKD (chronic kidney disease), stage III (HCC)   Chronic diastolic heart failure (HCC)   Essential hypertension   AKI (acute kidney injury) ([place])   AMS (altered mental status)   Encephalopathy due to COVID-19 virus   Healing pressure injury, stage 2 (HCC)   Lower abdominal pain, unspecified   Leukocytosis   Clostridium difficile diarrhea   Anasarca   . aspirin  81 mg Oral Daily  . buPROPion  100 mg Oral BID  . Chlorhexidine Gluconate Cloth  6 each Topical Daily  . DULoxetine  30 mg Oral BID  . enoxaparin (LOVENOX) injection  60 mg Subcutaneous Q24H  . feeding supplement (ENSURE ENLIVE)  237 mL Oral TID BM  . feeding supplement (PRO-STAT SUGAR FREE 64)  30 mL Oral TID WC  . fidaxomicin  200 mg Oral BID  . gabapentin  300 mg Oral TID  . Gerhardt's butt cream  1 application Topical TID  . insulin aspart  0-15 Units Subcutaneous Q6H  . levothyroxine  88 mcg Oral QAC breakfast  . midodrine  10 mg Oral BID WC  . multivitamin with minerals   1 tablet Oral Daily  . rosuvastatin  5 mg Oral Daily  . sodium chloride flush  10-40 mL Intracatheter Q12H  . sodium chloride flush  5 mL Intracatheter Q8H  . sucralfate  1 g Oral TID WC & HS    SUBJECTIVE:  Patient seen at the bedside this afternoon.  The patient does not have any pain at this time.  She feels like her diarrhea is still improving.  She does complain of some nausea right now.  She is able to keep down some fluids but does not have much of an appetite.  The abrasions on her abdomen feel like they are getting better to her.  We discussed that she is approaching her final doses of Unasyn and she will continue on the Fidaxomicin for C. Difficile prophylaxis.  Patient is in agreement.  Allergies  Allergen Reactions  . Morphine And Related Other (See Comments)    Behavioral Issues  . Sulfa Antibiotics    Review of Systems: All systems were reviewed and are otherwise negative unless mentioned in the subjective portion.  OBJECTIVE: Vitals:   07/25/19 1853 07/25/19 2153 07/26/19 0524 07/26/19 1624  BP: 112/79 125/76 125/78 111/80  Pulse: 96 96 99 99  Resp:  18 20 20   Temp: (!) 97.4 F (36.3 C) 97.7 F (36.5 C)  98.3 F (36.8 C)  TempSrc: Oral  Oral  Oral  SpO2: 94% 94% 93% 91%  Weight:      Height:       Body mass index is 48.04 kg/m.  Physical Exam Constitutional:      General: She is not in acute distress.    Appearance: She is well-developed. She is obese. She is not ill-appearing or toxic-appearing.     Comments: Lying in bed with head of bed elevated; pleasant.   HENT:     Head: Normocephalic and atraumatic.  Eyes:     Extraocular Movements: Extraocular movements intact.  Cardiovascular:     Rate and Rhythm: Normal rate and regular rhythm.     Heart sounds: Normal heart sounds.  Pulmonary:     Effort: Pulmonary effort is normal. No respiratory distress.     Breath sounds: Normal breath sounds. No wheezing or rales.  Abdominal:     General: There is no  distension.     Palpations: There is no mass.     Tenderness: There is no guarding.     Comments: Percutaneous drain present and patent. There is biliary drainage present.   Genitourinary:    Comments: Rectal tube is patent with liquid stool in the bag.  Skin:    General: Skin is warm and dry.  Neurological:     Mental Status: She is alert and oriented to person, place, and time.  Psychiatric:        Behavior: Behavior normal.        Thought Content: Thought content normal.        Judgment: Judgment normal.    Lab Results Lab Results  Component Value Date   WBC 9.5 07/26/2019   HGB 8.4 (L) 07/26/2019   HCT 29.4 (L) 07/26/2019   MCV 96.1 07/26/2019   PLT 425 (H) 07/26/2019    Lab Results  Component Value Date   CREATININE 1.13 (H) 07/26/2019   BUN 15 07/26/2019   NA 140 07/26/2019   K 3.5 07/26/2019   CL 110 07/26/2019   CO2 25 07/26/2019    Lab Results  Component Value Date   ALT 10 07/26/2019   AST 15 07/26/2019   ALKPHOS 129 (H) 07/26/2019   BILITOT 0.4 07/26/2019    Microbiology: Recent Results (from the past 240 hour(s))  Aerobic/Anaerobic Culture (surgical/deep wound)     Status: None (Preliminary result)   Collection Time: 07/23/19  5:02 PM   Specimen: BILE  Result Value Ref Range Status   Specimen Description BILE  Final   Special Requests NONE  Final   Gram Stain   Final    NO WBC SEEN RARE GRAM NEGATIVE RODS Performed at [facility], [place] [address] [place], [place] [address]    Culture   Final    RARE ESCHERICHIA COLI NO ANAEROBES ISOLATED; CULTURE IN PROGRESS FOR 5 DAYS    Report Status PENDING  Incomplete   Organism ID, Bacteria ESCHERICHIA COLI  Final      Susceptibility   Escherichia coli - MIC*    AMPICILLIN 4 SENSITIVE Sensitive     CEFAZOLIN <=4 SENSITIVE Sensitive     CEFEPIME <=1 SENSITIVE Sensitive     CEFTAZIDIME <=1 SENSITIVE Sensitive     CEFTRIAXONE <=1 SENSITIVE Sensitive     CIPROFLOXACIN <=0.25 SENSITIVE Sensitive      GENTAMICIN <=1 SENSITIVE Sensitive     IMIPENEM <=0.25 SENSITIVE Sensitive     TRIMETH/SULFA <=20 SENSITIVE Sensitive     AMPICILLIN/SULBACTAM <=2 SENSITIVE Sensitive  PIP/TAZO <=4 SENSITIVE Sensitive     Extended ESBL NEGATIVE Sensitive     * RARE ESCHERICHIA COLI  Culture, body fluid-bottle     Status: None (Preliminary result)   Collection Time: 07/23/19  5:55 PM   Specimen: Pleura  Result Value Ref Range Status   Specimen Description PLEURAL FLUID  Final   Special Requests   Final    BOTTLES DRAWN AEROBIC AND ANAEROBIC Blood Culture adequate volume   Culture   Final    NO GROWTH 3 DAYS Performed at [facility], [address] [place], [place] [address]    Report Status PENDING  Incomplete  Gram stain     Status: None   Collection Time: 07/23/19  5:55 PM   Specimen: Pleura  Result Value Ref Range Status   Specimen Description PLEURAL FLUID  Final   Special Requests NONE  Final   Gram Stain   Final    FEW WBC PRESENT, PREDOMINANTLY PMN NO ORGANISMS SEEN Performed at [facility], [place] [address], [place], [place] [address]    Report Status 07/24/2019 FINAL  Final   Earlene Plater, MD Internal Medicine, PGY1 Pager: [phone]  07/26/2019,5:06 PM

## 2019-07-26 NOTE — TOC Progression Note (Addendum)
Transition of Care [facility]) - Progression Note    Patient Details  Name: Alice Kemp MRN: [id number] Date of Birth: 1951-10-02  Transition of Care [facility]) CM/SW [place], [place] Phone Number: 07/26/2019, 4:39 PM  Clinical Narrative:    CSW spoke with pt- discussed that I have heard from [facility]. At this time pt has outstanding bill with [employer], pt is aware and states she worked with CSW there to apply for Medicaid. Pt requested that I call her brother and mother to discuss this further. Called pt mother Alice Kemp at [phone], pt brother Alice Kemp was present during that call on the phone as well. Explained barriers to pt returning to [place] at this time, encouraged family to discuss this together and to call [place] to assess if they could facilitate a payment plan and to get more information about the status of the Medicaid application. Pt mother requested I call pt other brother Alice Kemp and explain this as well.   CSW called [place] at [phone]. Again explained barriers that exist at this time surrounding pt return to [place]. Forrest had several concerns regarding pt finances and ability to pay [employer] copays- he states due to pt weight and debility she cannot be managed by family at home. Pt brother also under the impression that pt had "signed the papers" to start Medicaid application. Encouraged pt family to speak and contact [place] with these questions.   Pt brother Alice Kemp had several questions regarding pt positive status and continued isolation measures. CSW will f/u with MD team to see if they can provide any insight as pt last positive COVID was on 9/28.    Expected Discharge Plan: Skilled Nursing Facility Barriers to Discharge: Ship broker, Continued Medical Work up  Expected Discharge Plan and Services Expected Discharge Plan: [place] In-house Referral: Clinical Social Work Discharge Planning Services:  NA Post Acute Care Choice: [place] Living arrangements for the past 2 months: [place] Expected Discharge Date: 07/07/19               DME Arranged: N/A DME Agency: NA HH Arranged: NA HH Agency: NA   Social Determinants of Health (SDOH) Interventions    Readmission Risk Interventions Readmission Risk Prevention Plan 07/05/2019  Transportation Screening Complete  PCP or Specialist Appt within 3-5 Days Complete  HRI or [place] Complete  Social Work Consult for [place] Planning/Counseling Complete  Palliative Care Screening Not Applicable  Medication Review Press photographer) Complete  PCP or Specialist appointment within 3-5 days of discharge Not Complete  PCP/Specialist Appt Not Complete comments plan for SNF  HRI or [place] Not Complete  HRI or Home Care Consult Pt Refusal Comments plan for SNF  SW Recovery Care/Counseling Consult Complete  Palliative Care Screening Not Applicable  Skilled Nursing Facility Complete  Some recent data might be hidden

## 2019-07-26 NOTE — Progress Notes (Signed)
PROGRESS NOTE    Alice Kemp  [id number]  DOB: Apr 28, 1951  DOA: 07/04/2019 PCP: [name], MD  Brief Narrative:  68 y.o.Fwith obesity,hypothyroidism, morbid obesity, dyslipidemia, DM, chronic diastolic heart failure, CKD stage III, COVID infection in June, was admitted from home on 8/18-8/30 for spinal stenosis. She underwent lumbar laminectomy and microdiscectomy on 8/24 and was discharged to SNF. This hospitalization was complicated by COVID positivity on restest, also developed abdominal wound infection/cellulitis.  She presented this time on 9/28  with AMS/diarrhea/ falls at [facility]. She was again COVID positive and admitted to [facility], ultimately found to have Cdiff with associated dehydration..After treatment at [facility] with oral vancomycin, she developed new acute cholecystitis by CT prompting transfer to [facility]. She has since been seen by GS and is s/p percutaneous drain, remains on IV unasyn. She is also on Fidoxamin and flagyl per ID for C.diff. Home lasix which was held initially has been resumed in concern for fluid overload. Creatinine was upto 1.7 before reinitiating Lasix and fortunately improving (1.3) since initiated on diuretics.Her albumin level was significantly low at 1.0 making her vulnerable to 3rd spacing. ALP down trending (349-->123), leucocytosis resolved (20k-->8.7k)   Subjective:  Patient resting comfortably. On RA , saturating well on RA. Still has rectal tube with loose stool/PCT drain with bilious output  Objective: Vitals:   07/25/19 1218 07/25/19 1853 07/25/19 2153 07/26/19 0524  BP: 112/72 112/79 125/76 125/78  Pulse: 91 96 96 99  Resp:   18 20  Temp:  (!) 97.4 F (36.3 C) 97.7 F (36.5 C)   TempSrc:  Oral Oral   SpO2:  94% 94% 93%  Weight:      Height:        Intake/Output Summary (Last 24 hours) at 07/26/2019 1136 Last data filed at 07/26/2019 1126 Gross per 24 hour  Intake 260 ml  Output 1590 ml  Net -1330 ml   Filed Weights    07/21/19 0500 07/22/19 0500 07/23/19 0500  Weight: 128.6 kg 128.6 kg 127 kg    Physical Examination:  General exam: Appears calm and comfortable  Respiratory system: Clear to auscultation. Respiratory effort normal. Cardiovascular system: S1 & S2 heard, RRR. No JVD, murmurs, rubs, gallops or clicks. No pedal edema. Gastrointestinal system: Abdomen obese, RUQ JP drain in place, is nondistended, soft and nontender. No organomegaly or masses felt. Normal bowel sounds heard.+rectal tube in place Central nervous system: Alert and oriented. No focal neurological deficits. Extremities: Symmetric 5 x 5 power. Skin: stage 2 coccygeal decubitus Psychiatry: Judgement and insight appear normal. Mood & affect appropriate.     Data Reviewed: I have personally reviewed following labs and imaging studies  CBC: Recent Labs  Lab 07/20/19 0445 07/21/19 0355 07/23/19 0359 07/24/19 0853 07/25/19 0425 07/26/19 0440  WBC 11.8* 20.1* 16.1* 10.5 8.7 9.5  NEUTROABS 7.4 13.5* 10.8* 9.0*  --   --   HGB 8.4* 8.9* 8.1* 8.0* 8.1* 8.4*  HCT 27.9* 29.6* 25.9* 26.8* 26.7* 29.4*  MCV 90.6 91.6 90.9 93.4 92.4 96.1  PLT 305 339 375 351 363 [id number]*   Basic Metabolic Panel: Recent Labs  Lab 07/20/19 0445 07/21/19 0355 07/23/19 0359 07/24/19 0853 07/25/19 0425 07/26/19 0440  NA 138 134* 137 138 140 140  K 4.3 4.2 4.3 4.0 3.6 3.5  CL 108 102 107 107 110 110  CO2 25 25 22 24 25 25   GLUCOSE 104* 99 128* 105* 126* 131*  BUN 13 13 20 19 18  15  CREATININE 0.85 1.09* 1.67* 1.40* 1.31* 1.13*  CALCIUM 7.2* 7.3* 7.1* 7.0* 6.8* 7.2*  MG 1.7 1.7  --   --   --   --    GFR: Estimated Creatinine Clearance: 62 mL/min (A) (by C-G formula based on SCr of 1.13 mg/dL (H)). Liver Function Tests: Recent Labs  Lab 07/21/19 0355 07/23/19 0359 07/24/19 0853 07/25/19 0425 07/26/19 0440  AST 24 17 17 17 15   ALT 17 13 12 12 10   ALKPHOS 188* 159* 140* 128* 129*  BILITOT 0.5 0.5 0.8 0.2* 0.4  PROT 4.8* 4.5* 4.7* 4.7*  4.9*  ALBUMIN 1.3* 1.1* 1.1* 1.0* 1.1*   No results for input(s): LIPASE, AMYLASE in the last 168 hours. No results for input(s): AMMONIA in the last 168 hours. Coagulation Profile: Recent Labs  Lab 07/23/19 1420  INR 1.4*   Cardiac Enzymes: No results for input(s): CKTOTAL, CKMB, CKMBINDEX, TROPONINI in the last 168 hours. BNP (last 3 results) No results for input(s): PROBNP in the last 8760 hours. HbA1C: No results for input(s): HGBA1C in the last 72 hours. CBG: Recent Labs  Lab 07/25/19 0407 07/25/19 1217 07/25/19 1739 07/26/19 0019 07/26/19 0638  GLUCAP 111* 134* 150* 157* 117*   Lipid Profile: No results for input(s): CHOL, HDL, LDLCALC, TRIG, CHOLHDL, LDLDIRECT in the last 72 hours. Thyroid Function Tests: No results for input(s): TSH, T4TOTAL, FREET4, T3FREE, THYROIDAB in the last 72 hours. Anemia Panel: Recent Labs    07/26/19 0440  VITAMINB12 1,401*  TIBC 106*  IRON 33   Sepsis Labs: No results for input(s): PROCALCITON, LATICACIDVEN in the last 168 hours.  Recent Results (from the past 240 hour(s))  Aerobic/Anaerobic Culture (surgical/deep wound)     Status: None (Preliminary result)   Collection Time: 07/23/19  5:02 PM   Specimen: BILE  Result Value Ref Range Status   Specimen Description BILE  Final   Special Requests NONE  Final   Gram Stain NO WBC SEEN RARE GRAM NEGATIVE RODS   Final   Culture   Final    RARE GRAM NEGATIVE RODS NO ANAEROBES ISOLATED; CULTURE IN PROGRESS FOR 5 DAYS IDENTIFICATION AND SUSCEPTIBILITIES TO FOLLOW Performed at [facility], [place] [address], [place], [place] [address]    Report Status PENDING  Incomplete  Culture, body fluid-bottle     Status: None (Preliminary result)   Collection Time: 07/23/19  5:55 PM   Specimen: Pleura  Result Value Ref Range Status   Specimen Description PLEURAL FLUID  Final   Special Requests   Final    BOTTLES DRAWN AEROBIC AND ANAEROBIC Blood Culture adequate volume   Culture    Final    NO GROWTH 3 DAYS Performed at [facility], [address], [place], [place] [address]    Report Status PENDING  Incomplete  Gram stain     Status: None   Collection Time: 07/23/19  5:55 PM   Specimen: Pleura  Result Value Ref Range Status   Specimen Description PLEURAL FLUID  Final   Special Requests NONE  Final   Gram Stain   Final    FEW WBC PRESENT, PREDOMINANTLY PMN NO ORGANISMS SEEN Performed at [facility], [place] [address], [place], [place] [address]    Report Status 07/24/2019 FINAL  Final      Radiology Studies: No results found.      Scheduled Meds: . aspirin  81 mg Oral Daily  . buPROPion  100 mg Oral BID  .  Chlorhexidine Gluconate Cloth  6 each Topical Daily  . DULoxetine  30 mg Oral BID  . enoxaparin (LOVENOX) injection  60 mg Subcutaneous Q24H  . feeding supplement (ENSURE ENLIVE)  237 mL Oral TID BM  . feeding supplement (PRO-STAT SUGAR FREE 64)  30 mL Oral TID WC  . fidaxomicin  200 mg Oral BID  . gabapentin  300 mg Oral TID  . Gerhardt's butt cream  1 application Topical TID  . insulin aspart  0-15 Units Subcutaneous Q6H  . levothyroxine  88 mcg Oral QAC breakfast  . midodrine  10 mg Oral BID WC  . multivitamin with minerals  1 tablet Oral Daily  . rosuvastatin  5 mg Oral Daily  . sodium chloride flush  10-40 mL Intracatheter Q12H  . sodium chloride flush  5 mL Intracatheter Q8H  . sucralfate  1 g Oral TID WC & HS   Continuous Infusions: . ampicillin-sulbactam (UNASYN) IV 3 g (07/26/19 1003)    Assessment & Plan:    1. Acute acalculous cholecystitis: S/p percutaneous cholecystectomy tube, IR, 10/17. F/U biliary cx. On IV unasyn Day 5 (ID recommends 5-6 days course post drain placement) . Leucocytosis resolved.  On regular diet.  2. Severe C.diff colitis: Followed by ID. On fidoxomicin BID. Diarrhea improving. Encourage mobility/frequent bed changes to avoid autoinoculation. Per ID , remove rectal tube if and when stool  forming.Limit Unasyn course to a short duration.WBC normalized now. Per ID note-may consider Vanco taper on discharge vs continuing fodoxamin  3. Recent COVID infection: Positive COVID test results 2 months ago and 3 weeks ago. Not on any treatment currently. Prolonged hospitalization with prolonged debility.   4. Anasarca/Ascitis/acute on chronic diastolic CHF: likely 3rd spacing from severe hypoalbuminemia. Off IV fluids and resumed on lasix ( home dose held on admission)-titrate as needed.  5. AKI: creatinine peaked to 2.0 in earlier hospital course likely due to dehydration, then normalized with IV fluids. It started to uptrend again to 1.7 past week -likely congested kidney this time in the setting of aggressive IV hydration. Now improving with lasix.   6. Hypothyroidism: Continue levothyroxine  7. ?Orthostatic hypotension: patient on midodrine. SBP 110s.   8.  Diabetes with neuropathy: on SSI/neurontin. On asa as ?preventive measure  9. Depression: Continue Well butrin, duloxetine  10. Normocytic Anemia : Chronic GI losses vs AOCD. Stable around 8--8.4. Ferritin 141, last fe level 78 and TIBC 292, folate 9.7  in Aug, will repeat.   11. Severe PCM : Albumin level too low at 1.0. Dietery evaluation. Not sure if good candidate for TPN given ongoing infections.  12. Stage II coccyx pressure injury, not POA; local wound care  DVT prophylaxis: lovenox Code Status: DNR Family / Patient Communication: d/w patient Disposition Plan: SNF recommended , CM looking into options     LOS: 22 days    Time spent: 35 minutes    Guilford Shi, MD [facility] Pager [phone]  If 7PM-7AM, please contact night-coverage [email or web address] Password [email or web address] 07/26/2019, 11:36 AM

## 2019-07-26 NOTE — TOC Progression Note (Signed)
Transition of Care [facility]) - Progression Note    Patient Details  Name: Alice Kemp MRN: [id number] Date of Birth: 1951-07-22  Transition of Care [facility]) CM/SW [place], [place] Phone Number: 07/26/2019, 10:37 AM  Clinical Narrative:    CSW has received authorization for 7 days for pt to return to [facility]. However CSW received a call from admissions liaison at [facility] stating that pt has an outstanding balance which she had communicated to [facility] CSW when pt was at that hospital. [place] will attempt to reach pt through room phone as pt is still COVID+. Have reached out to [place] and [place] for further support for [place] facilities that will accept [employer] and COVID+ pts.    Expected Discharge Plan: Skilled Nursing Facility Barriers to Discharge: Ship broker, Continued Medical Work up  Expected Discharge Plan and Services Expected Discharge Plan: [place] In-house Referral: Clinical Social Work Discharge Planning Services: NA Post Acute Care Choice: [place] Living arrangements for the past 2 months: [place] Expected Discharge Date: 07/07/19               DME Arranged: N/A DME Agency: NA  HH Arranged: NA HH Agency: NA   Social Determinants of Health (SDOH) Interventions    Readmission Risk Interventions Readmission Risk Prevention Plan 07/05/2019  Transportation Screening Complete  PCP or Specialist Appt within 3-5 Days Complete  HRI or [place] Complete  Social Work Consult for [place] Planning/Counseling Complete  Palliative Care Screening Not Applicable  Medication Review Press photographer) Complete  PCP or Specialist appointment within 3-5 days of discharge Not Complete  PCP/Specialist Appt Not Complete comments plan for SNF  HRI or [place] Not Complete  HRI or Home Care Consult Pt Refusal Comments plan for SNF  SW Recovery  Care/Counseling Consult Complete  Palliative Care Screening Not Applicable  Skilled Nursing Facility Complete  Some recent data might be hidden

## 2019-07-27 DIAGNOSIS — K819 Cholecystitis, unspecified: Secondary | ICD-10-CM | POA: Diagnosis not present

## 2019-07-27 DIAGNOSIS — R601 Generalized edema: Secondary | ICD-10-CM | POA: Diagnosis not present

## 2019-07-27 DIAGNOSIS — N179 Acute kidney failure, unspecified: Secondary | ICD-10-CM | POA: Diagnosis not present

## 2019-07-27 DIAGNOSIS — A0472 Enterocolitis due to Clostridium difficile, not specified as recurrent: Secondary | ICD-10-CM | POA: Diagnosis not present

## 2019-07-27 LAB — CBC
HCT: 28.7 % — ABNORMAL LOW (ref 36.0–46.0)
Hemoglobin: 8.7 g/dL — ABNORMAL LOW (ref 12.0–15.0)
MCH: 28.5 pg (ref 26.0–34.0)
MCHC: 30.3 g/dL (ref 30.0–36.0)
MCV: 94.1 fL (ref 80.0–100.0)
Platelets: 419 10*3/uL — ABNORMAL HIGH (ref 150–400)
RBC: 3.05 MIL/uL — ABNORMAL LOW (ref 3.87–5.11)
RDW: 21.7 % — ABNORMAL HIGH (ref 11.5–15.5)
WBC: 8.6 10*3/uL (ref 4.0–10.5)
nRBC: 0 % (ref 0.0–0.2)

## 2019-07-27 LAB — COMPREHENSIVE METABOLIC PANEL
ALT: 10 U/L (ref 0–44)
AST: 15 U/L (ref 15–41)
Albumin: 1.1 g/dL — ABNORMAL LOW (ref 3.5–5.0)
Alkaline Phosphatase: 136 U/L — ABNORMAL HIGH (ref 38–126)
Anion gap: 4 — ABNORMAL LOW (ref 5–15)
BUN: 16 mg/dL (ref 8–23)
CO2: 26 mmol/L (ref 22–32)
Calcium: 7.1 mg/dL — ABNORMAL LOW (ref 8.9–10.3)
Chloride: 109 mmol/L (ref 98–111)
Creatinine, Ser: 1.07 mg/dL — ABNORMAL HIGH (ref 0.44–1.00)
GFR calc Af Amer: >60 mL/min (ref >60)
GFR calc non Af Amer: 53 mL/min — ABNORMAL LOW (ref >60)
Glucose, Bld: 149 mg/dL — ABNORMAL HIGH (ref 70–99)
Potassium: 3.4 mmol/L — ABNORMAL LOW (ref 3.5–5.1)
Sodium: 139 mmol/L (ref 135–145)
Total Bilirubin: 0.3 mg/dL (ref 0.3–1.2)
Total Protein: 4.9 g/dL — ABNORMAL LOW (ref 6.5–8.1)

## 2019-07-27 LAB — GLUCOSE, CAPILLARY
Glucose-Capillary: 113 mg/dL — ABNORMAL HIGH (ref 70–99)
Glucose-Capillary: 120 mg/dL — ABNORMAL HIGH (ref 70–99)
Glucose-Capillary: 134 mg/dL — ABNORMAL HIGH (ref 70–99)
Glucose-Capillary: 145 mg/dL — ABNORMAL HIGH (ref 70–99)

## 2019-07-27 LAB — FOLATE RBC
Folate, Hemolysate: 388 ng/mL
Folate, RBC: 1366 ng/mL (ref >498)
Hematocrit: 28.4 % — ABNORMAL LOW (ref 34.0–46.6)

## 2019-07-27 NOTE — Progress Notes (Signed)
[facility] for Infectious Disease  Date of Admission:  07/04/2019     Total days of antibiotics 24         ASSESSMENT:  Alice Kemp continues to have improvement in her abdominal symptoms s/p percutaneous drain placement and now advancing diet as tolerated. She does continue to have loosen stools and unclear frequency given rectal tube. Pt finished 8 day course of Unasyn. Will also continue fidaxomicin for C. Diff infection given the need for continued rectal tube and work towards taper near completion of therapy.   PLAN: 1. Pt finished will finish 8 day course of Unasyn for cholecystitis today. Continue Fidaxomicin. 2. Per tube drain care per IR.   Principal Problem:   Acalculous cholecystitis Active Problems:   Uncontrolled type 2 diabetes mellitus with stage 3 chronic kidney disease (HCC)   Morbid obesity (HCC)   Hypothyroidism   Depression   CKD (chronic kidney disease), stage III (HCC)   Chronic diastolic heart failure (HCC)   Essential hypertension   AKI (acute kidney injury) ([place])   AMS (altered mental status)   Encephalopathy due to COVID-19 virus   Healing pressure injury, stage 2 (HCC)   Lower abdominal pain, unspecified   Leukocytosis   Clostridium difficile diarrhea   Anasarca   . aspirin  81 mg Oral Daily  . buPROPion  100 mg Oral BID  . Chlorhexidine Gluconate Cloth  6 each Topical Daily  . DULoxetine  30 mg Oral BID  . enoxaparin (LOVENOX) injection  60 mg Subcutaneous Q24H  . feeding supplement (ENSURE ENLIVE)  237 mL Oral TID BM  . feeding supplement (PRO-STAT SUGAR FREE 64)  30 mL Oral TID WC  . fidaxomicin  200 mg Oral BID  . gabapentin  300 mg Oral TID  . Gerhardt's butt cream  1 application Topical TID  . insulin aspart  0-15 Units Subcutaneous Q6H  . levothyroxine  88 mcg Oral QAC breakfast  . midodrine  10 mg Oral BID WC  . multivitamin with minerals  1 tablet Oral Daily  . rosuvastatin  5 mg Oral Daily  . sodium chloride flush  10-40 mL  Intracatheter Q12H  . sodium chloride flush  5 mL Intracatheter Q8H  . sucralfate  1 g Oral TID WC & HS    SUBJECTIVE: Patient seen at the bedside this afternoon.  The patient does not have any pain at this time.  She feels like her diarrhea is still improving.  She does complain of some nausea right now.  She is able to keep down some fluids but does not have much of an appetite.  The abrasions on her abdomen feel like they are getting better to her.  We discussed that she is approaching her final doses of Unasyn and she will continue on the Fidaxomicin for C. Difficile prophylaxis.  Patient is in agreement.  Allergies  Allergen Reactions  . Morphine And Related Other (See Comments)    Behavioral Issues  . Sulfa Antibiotics    Review of Systems: All systems were reviewed and are otherwise negative unless mentioned in the subjective portion.  OBJECTIVE: Vitals:   07/26/19 0524 07/26/19 1624 07/26/19 1931 07/27/19 0540  BP: 125/78 111/80 (!) 131/97 134/83  Pulse: 99 99 (!) 106 96  Resp: 20 20 18 16   Temp:  98.3 F (36.8 C) 98.1 F (36.7 C) 97.8 F (36.6 C)  TempSrc:  Oral Oral Oral  SpO2: 93% 91% 97% 95%  Weight:  Height:       Body mass index is 48.04 kg/m.  Physical Exam Constitutional:      General: She is not in acute distress.    Appearance: She is well-developed. She is obese. She is not ill-appearing or toxic-appearing.     Comments: Lying in bed with head of bed elevated; pleasant.   HENT:     Head: Normocephalic and atraumatic.  Eyes:     Extraocular Movements: Extraocular movements intact.  Cardiovascular:     Rate and Rhythm: Normal rate and regular rhythm.     Heart sounds: Normal heart sounds.  Pulmonary:     Effort: Pulmonary effort is normal. No respiratory distress.     Breath sounds: Normal breath sounds. No wheezing or rales.  Abdominal:     General: There is no distension.     Palpations: There is no mass.     Tenderness: There is no  guarding.     Comments: Percutaneous drain present and patent. There is biliary drainage present.   Genitourinary:    Comments: Rectal tube is patent with liquid stool in the bag.  Skin:    General: Skin is warm and dry.     Comments: Linear erythematous skin breakdown on the fold of the abdomen above bellow button. Improving  Neurological:     General: No focal deficit present.     Mental Status: She is alert and oriented to person, place, and time.  Psychiatric:        Behavior: Behavior normal.        Thought Content: Thought content normal.        Judgment: Judgment normal.    Lab Results Lab Results  Component Value Date   WBC 8.6 07/27/2019   HGB 8.7 (L) 07/27/2019   HCT 28.7 (L) 07/27/2019   MCV 94.1 07/27/2019   PLT 419 (H) 07/27/2019    Lab Results  Component Value Date   CREATININE 1.07 (H) 07/27/2019   BUN 16 07/27/2019   NA 139 07/27/2019   K 3.4 (L) 07/27/2019   CL 109 07/27/2019   CO2 26 07/27/2019    Lab Results  Component Value Date   ALT 10 07/27/2019   AST 15 07/27/2019   ALKPHOS 136 (H) 07/27/2019   BILITOT 0.3 07/27/2019    Microbiology: Recent Results (from the past 240 hour(s))  Aerobic/Anaerobic Culture (surgical/deep wound)     Status: None (Preliminary result)   Collection Time: 07/23/19  5:02 PM   Specimen: BILE  Result Value Ref Range Status   Specimen Description BILE  Final   Special Requests NONE  Final   Gram Stain   Final    NO WBC SEEN RARE GRAM NEGATIVE RODS Performed at [facility], [place] [address], [place], [place] [address]    Culture   Final    RARE ESCHERICHIA COLI NO ANAEROBES ISOLATED; CULTURE IN PROGRESS FOR 5 DAYS    Report Status PENDING  Incomplete   Organism ID, Bacteria ESCHERICHIA COLI  Final      Susceptibility   Escherichia coli - MIC*    AMPICILLIN 4 SENSITIVE Sensitive     CEFAZOLIN <=4 SENSITIVE Sensitive     CEFEPIME <=1 SENSITIVE Sensitive     CEFTAZIDIME <=1 SENSITIVE Sensitive      CEFTRIAXONE <=1 SENSITIVE Sensitive     CIPROFLOXACIN <=0.25 SENSITIVE Sensitive     GENTAMICIN <=1 SENSITIVE Sensitive     IMIPENEM <=0.25 SENSITIVE Sensitive     TRIMETH/SULFA <=  20 SENSITIVE Sensitive     AMPICILLIN/SULBACTAM <=2 SENSITIVE Sensitive     PIP/TAZO <=4 SENSITIVE Sensitive     Extended ESBL NEGATIVE Sensitive     * RARE ESCHERICHIA COLI  Culture, body fluid-bottle     Status: None (Preliminary result)   Collection Time: 07/23/19  5:55 PM   Specimen: Pleura  Result Value Ref Range Status   Specimen Description PLEURAL FLUID  Final   Special Requests   Final    BOTTLES DRAWN AEROBIC AND ANAEROBIC Blood Culture adequate volume   Culture   Final    NO GROWTH 4 DAYS Performed at [facility], [address] [place], [place] [address]    Report Status PENDING  Incomplete  Gram stain     Status: None   Collection Time: 07/23/19  5:55 PM   Specimen: Pleura  Result Value Ref Range Status   Specimen Description PLEURAL FLUID  Final   Special Requests NONE  Final   Gram Stain   Final    FEW WBC PRESENT, PREDOMINANTLY PMN NO ORGANISMS SEEN Performed at [facility], [place] [address], [place],  [address]    Report Status 07/24/2019 FINAL  Final   Earlene Plater, MD Internal Medicine, PGY1 Pager: [phone]  07/27/2019,9:22 AM

## 2019-07-27 NOTE — Progress Notes (Signed)
PROGRESS NOTE    Alice Kemp  [id number] DOB: 1951-01-23 DOA: 07/04/2019 PCP: [name], MD    Brief Narrative: 68 y.o.Fwith obesity,hypothyroidism, morbid obesity, dyslipidemia, DM, chronic diastolic heart failure, CKD stage III, COVID infection in [place] admitted from home on 8/18-8/30 for spinal stenosis. She underwent lumbar laminectomy and microdiscectomy on 8/24 and was discharged to SNF.This hospitalization was complicated by COVID positivity on restest, alsodeveloped abdominal wound infection/cellulitis.  Shepresentedthis time on 9/28 with AMS/diarrhea/ falls at [facility]. She was again COVID positive and admitted to [facility], ultimately found to have Cdiff with associated dehydration..After treatment at [facility] with oral vancomycin, she developed new acute cholecystitis [facility]. She has since been seen by [facility] and is s/p percutaneous drain, remains on IV unasyn. She is also on Fidoxamin and flagyl per ID for C.diff. Home lasix which was held initially has been resumed in concern for fluid overload. Creatinine was upto 1.7 before reinitiating Lasix and fortunately improving (1.3) since initiated on diuretics.  Her albumin level was significantly low at 1.0 making her vulnerable to 3rd spacing.  ALP down trending (349-->123), leucocytosis resolved (20k-->8.7k).    Subjective: 10/21 Patient seen and examined, awake laying in bed.  No acute distress.  No acute events reported overnight.  Patient reports being cold all the time, but not feeling feverish.  Reports diarrhea seems improved.  Still has rectal tube, recently emptied but with some loose/liquid stool in bag.  She reports nearly constant nausea, no vomiting.      Assessment & Plan:   Principal Problem:   Acalculous cholecystitis Active Problems:   Uncontrolled type 2 diabetes mellitus with stage 3 chronic kidney disease (HCC)   Morbid obesity (HCC)   Hypothyroidism   Depression   CKD  (chronic kidney disease), stage III (HCC)   Chronic diastolic heart failure (HCC)   Essential hypertension   AKI (acute kidney injury) ([place])   AMS (altered mental status)   Encephalopathy due to COVID-19 virus   Healing pressure injury, stage 2 (HCC)   Lower abdominal pain, unspecified   Leukocytosis   Clostridium difficile diarrhea   Anasarca   1. Acute acalculous cholecystitis: S/p percutaneous cholecystectomy tube, IR, 10/17.  Leukocytosis resolved.  Tolerating regular diet but with nausea. - F/U biliary cx - continue IV unasyn Day 6 (ID recommends 5-6 days course post drain placement) .   2. Severe C.diff colitis: improving. Followed by ID. Pt reports diarrhea improving, still with rectal tube today. - continue fidoxomicin 200 mg PO BID.  - Encourage mobility/frequent bed changes to avoid autoinoculation.  - Per ID , remove rectal tube if and when stool forming.   - Limit Unasyn course to a short duration. - Per ID-may consider Vanco taper on discharge vs continuing fodoxamin  3. Recent COVID infection: Positive COVID test results 2 months ago and 3 weeks ago. Not on any treatment currently. Prolonged hospitalization with prolonged debility.   4. Anasarca/Ascites/acute on chronic diastolic CHF: likely 3rd spacing from severe hypoalbuminemia.  Patient has significantly edematous left>right hand and b/l lower extremities. - IV fluids d/c'd - cont home dose lasix, consider up-titirate as needed   5. Acute Kidney Injury - improving: creatinine peaked to 2.0 in earlier hospital course likely due to dehydration, then normalized with IV fluids. It started to uptrend again to 1.7 past week -likely congested kidney this time in the setting of aggressive IV hydration. Now improving with lasix.  - BMP in AM to monitor -  avoid nephrotoxins & renally dose meds as indicated  6. Hypothyroidism: Continuelevothyroxine  7. ?Orthostatic hypotension: patient on midodrine. SBP 110s.   8.   Diabetes with neuropathy: on SSI/neurontin. On asa as ?preventive measure  9. Depression: ContinueWell butrin, duloxetine  10. Normocytic Anemia : Chronic GI losses vs AOCD. Stable around 8--8.4. Ferritin 141, last fe level 78 and TIBC 292, folate 9.7  in Aug, will repeat.   11. Severe PCM : Albumin level too low at 1.0. Dietery evaluation. Unlikely a good candidate for TPN given ongoing infections. - dietary supplements added - encourage use  12. Stage II coccyx pressure injury, not POA; local wound care    DVT prophylaxis: Lovenox Code Status:   Code Status: DNR Family Communication: none at bedside Disposition Plan:  SNF recommended, CM on board  Consultants:   Infectious disease  Procedures:   Percutanous biliary drain by IR  Antimicrobials:  Unasyn  Fidaxomicin   Objective: Vitals:   07/26/19 0524 07/26/19 1624 07/26/19 1931 07/27/19 0540  BP: 125/78 111/80 (!) 131/97 134/83  Pulse: 99 99 (!) 106 96  Resp: 20 20 18 16   Temp:  98.3 F (36.8 C) 98.1 F (36.7 C) 97.8 F (36.6 C)  TempSrc:  Oral Oral Oral  SpO2: 93% 91% 97% 95%  Weight:      Height:        Intake/Output Summary (Last 24 hours) at 07/27/2019 1435 Last data filed at 07/26/2019 1630 Gross per 24 hour  Intake 240 ml  Output 100 ml  Net 140 ml   Filed Weights   07/21/19 0500 07/22/19 0500 07/23/19 0500  Weight: 128.6 kg 128.6 kg 127 kg    Examination:  General exam: awake, alert, no acute distress, morbidly obese HEENT: atraumatic, clear conjunctiva, anicteric sclera, moist mucus membranes, hearing grossly normal  Respiratory system: decreased breath sounds due to body habitus, but clear, no wheezes, rales or rhonchi, normal respiratory effort. Cardiovascular system: normal S1/S2 but distant due to body habitus, RRR, significant lower extremity edema Gastrointestinal system: obese abdomen, soft, non-tender, non-distended abdomen, hyperactive bowel sounds. Central nervous system:  alert and oriented x4. no gross focal neurologic deficits, normal speech Extremities: moves all, normal tone, left hand with significant nonpitting edema Skin: dry, intact, normal temperature Psychiatry: normal mood, congruent affect, judgement and insight appear normal    Data Reviewed: I have personally reviewed following labs and imaging studies  CBC: Recent Labs  Lab 07/21/19 0355 07/23/19 0359 07/24/19 0853 07/25/19 0425 07/26/19 0440 07/27/19 0413  WBC 20.1* 16.1* 10.5 8.7 9.5 8.6  NEUTROABS 13.5* 10.8* 9.0*  --   --   --   HGB 8.9* 8.1* 8.0* 8.1* 8.4* 8.7*  HCT 29.6* 25.9* 26.8* 26.7* 29.4* 28.7*  MCV 91.6 90.9 93.4 92.4 96.1 94.1  PLT 339 375 351 363 425* [id number]*   Basic Metabolic Panel: Recent Labs  Lab 07/21/19 0355 07/23/19 0359 07/24/19 0853 07/25/19 0425 07/26/19 0440 07/27/19 0413  NA 134* 137 138 140 140 139  K 4.2 4.3 4.0 3.6 3.5 3.4*  CL 102 107 107 110 110 109  CO2 25 22 24 25 25 26   GLUCOSE 99 128* 105* 126* 131* 149*  BUN 13 20 19 18 15 16   CREATININE 1.09* 1.67* 1.40* 1.31* 1.13* 1.07*  CALCIUM 7.3* 7.1* 7.0* 6.8* 7.2* 7.1*  MG 1.7  --   --   --   --   --    GFR: Estimated Creatinine Clearance: 65.5 mL/min (A) (by C-G formula  based on SCr of 1.07 mg/dL (H)). Liver Function Tests: Recent Labs  Lab 07/23/19 0359 07/24/19 0853 07/25/19 0425 07/26/19 0440 07/27/19 0413  AST 17 17 17 15 15   ALT 13 12 12 10 10   ALKPHOS 159* 140* 128* 129* 136*  BILITOT 0.5 0.8 0.2* 0.4 0.3  PROT 4.5* 4.7* 4.7* 4.9* 4.9*  ALBUMIN 1.1* 1.1* 1.0* 1.1* 1.1*   No results for input(s): LIPASE, AMYLASE in the last 168 hours. No results for input(s): AMMONIA in the last 168 hours. Coagulation Profile: Recent Labs  Lab 07/23/19 1420  INR 1.4*   Cardiac Enzymes: No results for input(s): CKTOTAL, CKMB, CKMBINDEX, TROPONINI in the last 168 hours. BNP (last 3 results) No results for input(s): PROBNP in the last 8760 hours. HbA1C: No results for input(s): HGBA1C  in the last 72 hours. CBG: Recent Labs  Lab 07/26/19 1306 07/26/19 1812 07/26/19 2316 07/27/19 0541 07/27/19 1203  GLUCAP 125* 117* 155* 113* 120*   Lipid Profile: No results for input(s): CHOL, HDL, LDLCALC, TRIG, CHOLHDL, LDLDIRECT in the last 72 hours. Thyroid Function Tests: No results for input(s): TSH, T4TOTAL, FREET4, T3FREE, THYROIDAB in the last 72 hours. Anemia Panel: Recent Labs    07/26/19 0440  VITAMINB12 1,401*  TIBC 106*  IRON 33   Sepsis Labs: No results for input(s): PROCALCITON, LATICACIDVEN in the last 168 hours.  Recent Results (from the past 240 hour(s))  Aerobic/Anaerobic Culture (surgical/deep wound)     Status: None (Preliminary result)   Collection Time: 07/23/19  5:02 PM   Specimen: BILE  Result Value Ref Range Status   Specimen Description BILE  Final   Special Requests NONE  Final   Gram Stain   Final    NO WBC SEEN RARE GRAM NEGATIVE RODS Performed at [facility], [address], [place], [place] [address]    Culture   Final    RARE ESCHERICHIA COLI NO ANAEROBES ISOLATED; CULTURE IN PROGRESS FOR 5 DAYS    Report Status PENDING  Incomplete   Organism ID, Bacteria ESCHERICHIA COLI  Final      Susceptibility   Escherichia coli - MIC*    AMPICILLIN 4 SENSITIVE Sensitive     CEFAZOLIN <=4 SENSITIVE Sensitive     CEFEPIME <=1 SENSITIVE Sensitive     CEFTAZIDIME <=1 SENSITIVE Sensitive     CEFTRIAXONE <=1 SENSITIVE Sensitive     CIPROFLOXACIN <=0.25 SENSITIVE Sensitive     GENTAMICIN <=1 SENSITIVE Sensitive     IMIPENEM <=0.25 SENSITIVE Sensitive     TRIMETH/SULFA <=20 SENSITIVE Sensitive     AMPICILLIN/SULBACTAM <=2 SENSITIVE Sensitive     PIP/TAZO <=4 SENSITIVE Sensitive     Extended ESBL NEGATIVE Sensitive     * RARE ESCHERICHIA COLI  Culture, body fluid-bottle     Status: None (Preliminary result)   Collection Time: 07/23/19  5:55 PM   Specimen: Pleura  Result Value Ref Range Status   Specimen Description PLEURAL FLUID   Final   Special Requests   Final    BOTTLES DRAWN AEROBIC AND ANAEROBIC Blood Culture adequate volume   Culture   Final    NO GROWTH 4 DAYS Performed at [facility], [address], [place], [place] [address]    Report Status PENDING  Incomplete  Gram stain     Status: None   Collection Time: 07/23/19  5:55 PM   Specimen: Pleura  Result Value Ref Range Status   Specimen Description PLEURAL FLUID  Final  Special Requests NONE  Final   Gram Stain   Final    FEW WBC PRESENT, PREDOMINANTLY PMN NO ORGANISMS SEEN Performed at [facility], [address] [place], [place] [address]    Report Status 07/24/2019 FINAL  Final         Radiology Studies: No results found.      Scheduled Meds: . aspirin  81 mg Oral Daily  . buPROPion  100 mg Oral BID  . Chlorhexidine Gluconate Cloth  6 each Topical Daily  . DULoxetine  30 mg Oral BID  . enoxaparin (LOVENOX) injection  60 mg Subcutaneous Q24H  . feeding supplement (ENSURE ENLIVE)  237 mL Oral TID BM  . feeding supplement (PRO-STAT SUGAR FREE 64)  30 mL Oral TID WC  . fidaxomicin  200 mg Oral BID  . gabapentin  300 mg Oral TID  . Gerhardt's butt cream  1 application Topical TID  . insulin aspart  0-15 Units Subcutaneous Q6H  . levothyroxine  88 mcg Oral QAC breakfast  . midodrine  10 mg Oral BID WC  . multivitamin with minerals  1 tablet Oral Daily  . rosuvastatin  5 mg Oral Daily  . sodium chloride flush  10-40 mL Intracatheter Q12H  . sodium chloride flush  5 mL Intracatheter Q8H  . sucralfate  1 g Oral TID WC & HS   Continuous Infusions:   LOS: 23 days    Time spent: 40-45 min    Ezekiel Slocumb, DO [facility] Pager: [phone]  If 7PM-7AM, please contact night-coverage [email or web address] Password [email or web address] 07/27/2019, 2:35 PM

## 2019-07-27 NOTE — Progress Notes (Signed)
Referring Physician(s): Jackson Latino, PA-C  Supervising Physician: Jacqulynn Cadet  Patient Status:  [facility] - In-pt  Chief Complaint:  S/P Percutaneous cholecystostomy 07/23/2019 by Dr. Anselm Pancoast  Subjective:  Alice Kemp is doing ok today. She is lying in bed. She is inquiring about the culture.  The Aerobic/Anarobic culture grew E. Coli.  She is asking about removing the drain.  Allergies: Morphine and related and Sulfa antibiotics  Medications: Prior to Admission medications   Medication Sig Start Date End Date Taking? Authorizing Provider  allopurinol (ZYLOPRIM) 100 MG tablet TAKE 1 TABLET BY MOUTH ONCE DAILY Patient taking differently: Take 100 mg by mouth daily.  06/28/18  Yes Burns, Claudina Lick, MD  aspirin 81 MG tablet Take 81 mg by mouth daily. Reported on 12/24/2015   Yes [provider]  bisacodyl (DULCOLAX) 10 MG suppository Place 1 suppository (10 mg total) rectally daily as needed for moderate constipation. 06/05/19  Yes Sheikh, Omair Latif, DO  buPROPion (WELLBUTRIN SR) 200 MG 12 hr tablet Take 1 tablet (200 mg total) by mouth daily. 12/15/18  Yes Abby Potash, PA-C  DULoxetine (CYMBALTA) 30 MG capsule Take 1 capsule (30 mg total) by mouth 2 (two) times daily. 05/11/18  Yes Burns, Claudina Lick, MD  escitalopram (LEXAPRO) 10 MG tablet Take 10 mg by mouth daily.   Yes [provider]  ferrous sulfate 325 (65 FE) MG tablet Take 325 mg by mouth daily with breakfast. 06/07/19 07/19/19 Yes [provider]  fluticasone (CUTIVATE) 0.05 % cream APPLY 3 TIMES A DAY AS NEEDED FOR ITCHING Patient taking differently: Apply 1 application topically 3 (three) times daily as needed (itching).  07/23/12  Yes Renato Shin, MD  gabapentin (NEURONTIN) 300 MG capsule Take 300 mg by mouth 2 (two) times daily.  05/05/16  Yes [provider]  insulin aspart (NOVOLOG) 100 UNIT/ML injection Inject 2-14 Units into the skin See admin instructions. Per Sliding Scale: If Blood  Sugar is less than 70, Call MD. If Blood Sugar if 201-250=2u, 251-300=4u, 301-350=6u, 351-400=8u, 401-450=10u, 451-500=12u, if >500=14u AND call MD.   Yes [provider]  insulin glargine (LANTUS) 100 UNIT/ML injection Inject 15 Units into the skin at bedtime.   Yes [provider]  levothyroxine (SYNTHROID, LEVOTHROID) 88 MCG tablet Take 88 mcg by mouth daily before breakfast.   Yes [provider]  loperamide (IMODIUM A-D) 2 MG tablet Take 2-4 mg by mouth See admin instructions. Take 2 tablets to start, then 1 tab after each loose stool. NOT TO EXCEED 4 TABS PER DAY   Yes [provider]  Magnesium 200 MG TABS Take 200 mg by mouth daily.    Yes [provider]  pantoprazole (PROTONIX) 40 MG tablet Take 1 tablet (40 mg total) by mouth at bedtime. 06/05/19  Yes Sheikh, Omair Latif, DO  polyethylene glycol (MIRALAX / GLYCOLAX) 17 g packet Take 17 g by mouth daily as needed. Patient taking differently: Take 17 g by mouth daily as needed for mild constipation.  06/05/19  Yes Sheikh, Omair Latif, DO  rosuvastatin (CRESTOR) 5 MG tablet Take 1 tablet (5 mg total) by mouth daily. 09/24/17  Yes Beasley, Caren D, MD  saccharomyces boulardii (FLORASTOR) 250 MG capsule Take 250 mg by mouth 2 (two) times daily.   Yes [provider]  sodium chloride 0.9 % Inject 2,000 mLs into the vein See admin instructions. Infuse 1st liter @75CC /HR, then Infuse 2nd liter @40CC /HR every shift   Yes [provider]  vitamin B-12 (CYANOCOBALAMIN) 500 MCG tablet Take 500 mcg by mouth every Monday, Wednesday, and Friday.    Yes [provider]  benzonatate (TESSALON) 100 MG capsule Take 1 capsule (100 mg total) by mouth 3 (three) times daily as needed for cough. Patient not taking: Reported on 07/04/2019 04/01/19   Hoyt Koch, MD  celecoxib (CELEBREX) 100 MG capsule Take 1 capsule (100 mg total) by mouth daily as needed. Patient not taking: Reported on  07/04/2019 05/19/19   [name], MD  colchicine 0.6 MG tablet TAKE 2 TABLETS BY MOUTH AT ONSET OF FLARE THEN TAKE 1 TABLET 1 HOUR LATER Patient not taking: TAKE 2 TABLETS BY MOUTH AT ONSET OF FLARE THEN TAKE 1 TABLET 1 HOUR LATER 06/28/18   Burns, Claudina Lick, MD  furosemide (LASIX) 80 MG tablet Take 1 tablet (80 mg total) by mouth daily. Patient not taking: Reported on 07/04/2019 05/28/15   Rowe Clack, MD  losartan (COZAAR) 25 MG tablet Take 1 tablet (25 mg total) by mouth daily. -- Office visit needed for further refills Patient not taking: Reported on 07/04/2019 11/12/17   [name], MD  meclizine (ANTIVERT) 25 MG tablet Take 0.5-1 tablets (12.5-25 mg total) by mouth 3 (three) times daily as needed for dizziness. Patient not taking: Reported on 07/04/2019 01/05/19   [name], MD  methocarbamol (ROBAXIN) 500 MG tablet Take 1 tablet (500 mg total) by mouth every 8 (eight) hours as needed for muscle spasms. Patient not taking: Reported on 07/04/2019 06/05/19   Raiford Noble Latif, DO  ondansetron (ZOFRAN) 4 MG tablet Take 1 tablet (4 mg total) by mouth every 6 (six) hours as needed for nausea or vomiting. Patient not taking: Reported on 07/04/2019 06/05/19   Raiford Noble [place], DO  [facility] VERIO test strip Check BS twice daily 12/15/18   Abby Potash, PA-C  oxyCODONE 10 MG TABS Take 1 tablet (10 mg total) by mouth every 6 (six) hours as needed for severe pain. Patient not taking: Reported on 07/04/2019 06/05/19   Raiford Noble Latif, DO  senna-docusate (SENOKOT-S) 8.6-50 MG tablet Take 1 tablet by mouth 2 (two) times daily. Patient not taking: Reported on 07/04/2019 06/05/19   Raiford Noble Latif, DO  spironolactone (ALDACTONE) 25 MG tablet TAKE 1 TABLET (25 MG TOTAL) BY MOUTH DAILY. Patient not taking: Reported on 07/04/2019 06/28/18   [name], MD     Vital Signs: BP 134/83 (BP Location: Left Wrist)    Pulse 96    Temp 97.8 F (36.6 C) (Oral)    Resp 16    Ht 5' 4.02" (1.626 m)     Wt 127 kg    SpO2 95%    BMI 48.04 kg/m   Physical Exam Awake and alert NAD RUQ drain in place Site looks good Flushes easily. About 50 mL clear bilious drainage in bag. 115 mL output recorded for 24 hours.  Imaging: Ir Perc Cholecystostomy  Result Date: 07/23/2019 INDICATION: 68 year old with acute cholecystitis and recent COVID-19 infection. Patient is not a surgical candidate at this time. Plan for percutaneous cholecystostomy tube placement. EXAM: PERCUTANEOUS CHOLECYSTOSTOMY TUBE PLACEMENT WITH ULTRASOUND AND FLUOROSCOPIC GUIDANCE ULTRASOUND-GUIDED PARACENTESIS MEDICATIONS: Moderate sedation ANESTHESIA/SEDATION: Moderate (conscious) sedation was employed during this procedure. A total of Versed 1.0 mg and Fentanyl 50 mcg was administered intravenously. Moderate Sedation Time: 23 minutes. The patient's level of consciousness and vital signs were monitored continuously by radiology nursing throughout the procedure under my direct supervision. FLUOROSCOPY TIME:  Fluoroscopy Time: 36 seconds, 4 mGy COMPLICATIONS: None immediate. PROCEDURE: Informed written consent was obtained from the patient after a thorough discussion of the procedural risks, benefits and alternatives. All questions were addressed. Maximal Sterile Barrier Technique was utilized including caps, mask, sterile gowns, sterile gloves, sterile drape, hand hygiene and skin antiseptic. A timeout was performed prior to the initiation of the procedure. Patient was placed supine. The right abdomen was prepped and draped in sterile fashion. Ultrasound was used to identify a distended gallbladder and perihepatic ascites. Skin was anesthetized with 1% lidocaine. Using ultrasound guidance, a Yueh catheter was directed into the perihepatic fluid adjacent to the gallbladder. Yellow clear fluid was aspirated. Approximately 300 mL of fluid was removed from the perihepatic space. Ultrasound was used to direct a 21 gauge needle into the distended  gallbladder. 0.018 wire was advanced into the gallbladder under ultrasound guidance. An Accustick dilator set was placed. The tract was dilated over a J wire to accommodate a 10.2 [place] multipurpose drain. Greater than 150 mL of dark cloudy thick bilious fluid was aspirated from the gallbladder. Gallbladder was decompressed at the end of the procedure. Catheter was sutured to skin and attached to gravity bag. Fluoroscopic and ultrasound images were taken and saved for documentation. FINDINGS: Approximately 300 mL of clear yellow ascites was removed from the perihepatic space. 10.2 French drain was placed in the gallbladder and the gallbladder was successfully decompressed. IMPRESSION: 1. Successful percutaneous cholecystostomy tube placement with ultrasound and fluoroscopic guidance. Fluid was sent for culture. 2. Ultrasound-guided paracentesis. Ascites fluid was sent for culture. Electronically Signed   By: Markus Daft M.D.   On: 07/23/2019 17:41   Ir Paracentesis  Result Date: 07/23/2019 INDICATION: 68 year old with acute cholecystitis and recent COVID-19 infection. Patient is not a surgical candidate at this time. Plan for percutaneous cholecystostomy tube placement. EXAM: PERCUTANEOUS CHOLECYSTOSTOMY TUBE PLACEMENT WITH ULTRASOUND AND FLUOROSCOPIC GUIDANCE ULTRASOUND-GUIDED PARACENTESIS MEDICATIONS: Moderate sedation ANESTHESIA/SEDATION: Moderate (conscious) sedation was employed during this procedure. A total of Versed 1.0 mg and Fentanyl 50 mcg was administered intravenously. Moderate Sedation Time: 23 minutes. The patient's level of consciousness and vital signs were monitored continuously by radiology nursing throughout the procedure under my direct supervision. FLUOROSCOPY TIME:  Fluoroscopy Time: 36 seconds, 4 mGy COMPLICATIONS: None immediate. PROCEDURE: Informed written consent was obtained from the patient after a thorough discussion of the procedural risks, benefits and alternatives. All questions  were addressed. Maximal Sterile Barrier Technique was utilized including caps, mask, sterile gowns, sterile gloves, sterile drape, hand hygiene and skin antiseptic. A timeout was performed prior to the initiation of the procedure. Patient was placed supine. The right abdomen was prepped and draped in sterile fashion. Ultrasound was used to identify a distended gallbladder and perihepatic ascites. Skin was anesthetized with 1% lidocaine. Using ultrasound guidance, a Yueh catheter was directed into the perihepatic fluid adjacent to the gallbladder. Yellow clear fluid was aspirated. Approximately 300 mL of fluid was removed from the perihepatic space. Ultrasound was used to direct a 21 gauge needle into the distended gallbladder. 0.018 wire was advanced into the gallbladder under ultrasound guidance. An Accustick dilator set was placed. The tract was dilated over a J wire to accommodate a 10.2 [place] multipurpose drain. Greater than 150 mL of dark cloudy thick bilious fluid was aspirated from the gallbladder. Gallbladder was decompressed at the end of the procedure. Catheter was sutured to skin and attached to gravity bag. Fluoroscopic and ultrasound images were taken and saved for documentation. FINDINGS: Approximately  300 mL of clear yellow ascites was removed from the perihepatic space. 10.2 French drain was placed in the gallbladder and the gallbladder was successfully decompressed. IMPRESSION: 1. Successful percutaneous cholecystostomy tube placement with ultrasound and fluoroscopic guidance. Fluid was sent for culture. 2. Ultrasound-guided paracentesis. Ascites fluid was sent for culture. Electronically Signed   By: Markus Daft M.D.   On: 07/23/2019 17:41    Labs:  CBC: Recent Labs    07/24/19 0853 07/25/19 0425 07/26/19 0440 07/27/19 0413  WBC 10.5 8.7 9.5 8.6  HGB 8.0* 8.1* 8.4* 8.7*  HCT 26.8* 26.7* 29.4* 28.7*  PLT 351 363 425* 419*    COAGS: Recent Labs    07/23/19 1420  INR 1.4*     BMP: Recent Labs    07/24/19 0853 07/25/19 0425 07/26/19 0440 07/27/19 0413  NA 138 140 140 139  K 4.0 3.6 3.5 3.4*  CL 107 110 110 109  CO2 24 25 25 26   GLUCOSE 105* 126* 131* 149*  BUN 19 18 15 16   CALCIUM 7.0* 6.8* 7.2* 7.1*  CREATININE 1.40* 1.31* 1.13* 1.07*  GFRNONAA 38* 41* 50* 53*  GFRAA 44* 48* 57* >60    LIVER FUNCTION TESTS: Recent Labs    07/24/19 0853 07/25/19 0425 07/26/19 0440 07/27/19 0413  BILITOT 0.8 0.2* 0.4 0.3  AST 17 17 15 15   ALT 12 12 10 10   ALKPHOS 140* 128* 129* 136*  PROT 4.7* 4.7* 4.9* 4.9*  ALBUMIN 1.1* 1.0* 1.1* 1.1*    Assessment and Plan:  Acute cholecystitis - not currently a surgical candidate.  S/P 07/23/2019 Perc Chole = needs to stay in place x 6 weeks.   IR clinic f/u for injection to evaluate cystic duct.  Continue current drain care.  Electronically Signed: Murrell Redden, PA-C 07/27/2019, 1:58 PM    I spent a total of 15 Minutes at the the patient's bedside AND on the patient's hospital floor or unit, greater than 50% of which was counseling/coordinating care for f/u perc chole.

## 2019-07-27 NOTE — Progress Notes (Addendum)
Physical Therapy Treatment Patient Details Name: Alice Kemp MRN: [id number] DOB: May 24, 1951 Today's Date: 07/27/2019    History of Present Illness 68 y/o female pt w/ hx of anxiety, arthritis, back pain, benign neoplasm of colon, breast abscess, cervical radiculopathy, CKD III, depression, diastolic HF, DM II, glaucoma, gout, HLD, morbid obesity, obstructive sleep apnea, pericardial effusion, pulmonary HTN, hx of COVID in 06/20. pt underwent decompressive laminectomy L3 partial inferior laminectomy L2 and microscopic discectomy from the left and foraminotomies of the left L3 nerve root on 05/30/19. She had a fall on 07/02/2019, admitted with AMS, hypotension; C-diff.     PT Comments    Patient received in bed, very anxious about SNF recommendation and stating "I have no money and can't go", reports it is just her and her frail 42 year old mother at home and there is not any other family/friends she could stay with otherwise. Educated on what she will need to be able to do to go home in this situation, and strongly advised her to talk with case manager about [facility] financial assist to see if this will open up more options as well, also advised her to speak with case manager about [facility] owned nursing facilities to see if this is even an option. Attempted to perform bed mobility with maxAx1, able to get partway through transfer before she pushed herself in posterior direction, stated "I'm falling backwards", and could not complete transfer. Attempted twice with same results. She was able to scoot herself up the bed with bed in trendelenburg position however. She was positioned to comfort and left in bed with all needs met, nurse tech present and attending. Continue to recommend SNF.    Follow Up Recommendations  SNF     Equipment Recommendations  None recommended by PT    Recommendations for Other Services       Precautions / Restrictions Precautions Precautions: Fall Precaution Comments:  incontinence, low BP Restrictions Weight Bearing Restrictions: No    Mobility  Bed Mobility Overal bed mobility: Needs Assistance Bed Mobility: Rolling;Sidelying to Sit;Sit to Sidelying Rolling: Max assist Sidelying to sit: Max assist     Sit to sidelying: Max assist General bed mobility comments: MaxA for rolling and attempting sidelying to sit. On x2 attempts, got partially through transfer to EOB before saying "I cant, I'm falling backwards" and pushing herself back  Transfers                 General transfer comment: unable to get to EOB  Ambulation/Gait             General Gait Details: unable to ambulate at this time   Stairs             Wheelchair Mobility    Modified Rankin (Stroke Patients Only)       Balance Overall balance assessment: Needs assistance Sitting-balance support: Feet supported;Bilateral upper extremity supported Sitting balance-Leahy Scale: Poor   Postural control: Posterior lean   Standing balance-Leahy Scale: Zero Standing balance comment: unable to stand at this time                            Cognition Arousal/Alertness: Awake/alert Behavior During Therapy: WFL for tasks assessed/performed Overall Cognitive Status: Impaired/Different from baseline Area of Impairment: Orientation;Attention;Memory;Following commands;Safety/judgement;Awareness;Problem solving                 Orientation Level: Person;Place;Time;Situation Current Attention Level: Sustained Memory: Decreased recall of precautions;Decreased  short-term memory Following Commands: Follows one step commands with increased time;Follows one step commands consistently Safety/Judgement: Decreased awareness of safety;Decreased awareness of deficits Awareness: Anticipatory Problem Solving: Slow processing;Decreased initiation;Difficulty sequencing General Comments: continues to demonstrate improved cognition, able to give overview of her  hospital course and home situation      Exercises      General Comments General comments (skin integrity, edema, etc.): VSS      Pertinent Vitals/Pain Pain Assessment: No/denies pain Pain Score: 0-No pain Pain Intervention(s): Limited activity within patient's tolerance;Monitored during session    Home Living                      Prior Function            PT Goals (current goals can now be found in the care plan section) Acute Rehab PT Goals Patient Stated Goal: to get back home to her mom and dogs PT Goal Formulation: With patient Time For Goal Achievement: 08/02/19 Potential to Achieve Goals: Fair Progress towards PT goals: Progressing toward goals    Frequency    Min 2X/week      PT Plan Current plan remains appropriate    Co-evaluation              AM-PAC PT "6 Clicks" Mobility   Outcome Measure  Help needed turning from your back to your side while in a flat bed without using bedrails?: A Lot Help needed moving from lying on your back to sitting on the side of a flat bed without using bedrails?: A Lot Help needed moving to and from a bed to a chair (including a wheelchair)?: Total Help needed standing up from a chair using your arms (e.g., wheelchair or bedside chair)?: Total Help needed to walk in hospital room?: Total Help needed climbing 3-5 steps with a railing? : Total 6 Click Score: 8    End of Session   Activity Tolerance: Patient tolerated treatment well Patient left: in bed;with call bell/phone within reach;with nursing/sitter in room   PT Visit Diagnosis: Other abnormalities of gait and mobility (R26.89);History of falling (Z91.81)     Time: 9093-1121 PT Time Calculation (min) (ACUTE ONLY): 31 min  Charges:  $Therapeutic Activity: 8-22 mins $Self Care/Home Management: 8-22                     Deniece Ree PT, DPT, CBIS  Supplemental Physical Therapist [place]    Pager [phone] Acute Rehab Office  [phone]

## 2019-07-27 NOTE — TOC Progression Note (Addendum)
Transition of Care [facility]) - Progression Note    Patient Details  Name: FLONNIE FELT MRN: [id number] Date of Birth: [birth year]  Transition of Care [facility]) CM/SW [place], [place] Phone Number: 07/27/2019, 11:27 AM  Clinical Narrative:    4:00pm- CSW spoke with pt via room phone due to [place] isolation. We discussed barriers to placement and conversations that were had with pt mother and pt brother. I let pt know that we are willing to assist with as much as we can but that ultimately she was going to need assistance with completing this application. After much back and forth pt agreeable to her brother Shea Evans assisting with Medicaid application. CSW will reach out to financial counseling to see if it would be possible for them to support this process as well. Pt understanding that if she refuses to cooperate with Medicaid and further care planning process and she becomes medically stable that she cannot remain in the hospital. Pt states understanding. CSW continuing to follow.   3:50pm- CSW had extended company with pt brother Shea Evans, supported by Barbette Or, [facility] supervisor. Pt brother states that he and his family cannot pay bill for [facility] and that responsibility falls on the pt. He is willing to assist with applying for Medicaid but his sister needs to agree to provide him with information relating to finances in order for him to assist. Pt brother also unable to take pt home stating that pt likely needs long term placement. CSW will call and speak with pt to let her know where we are in the disposition process.   Referral made to [facility] who accepts COVID+ pts and is in network with [facility]. Barbette Or will reach out to medical leadership also in regards to repeat testing for pt as her last + test was on 9/28.  Recommend that a PMT consult may also be in best interest of pt as she is making big life choices about future care.    11:27pm- Have spoken with Jari Pigg  from [facility] and pt brother Forrest this morning regarding pt disposition. Have arranged a call to speak with Forrest accompanied by my [place]. Confirmed time of 3pm.    Expected Discharge Plan: Skilled Nursing Facility Barriers to Discharge: Ship broker, Continued Medical Work up  Expected Discharge Plan and Services Expected Discharge Plan: [place] In-house Referral: Clinical Social Work Discharge Planning Services: NA Post Acute Care Choice: [place] Living arrangements for the past 2 months: [place] Expected Discharge Date: 07/07/19               DME Arranged: N/A DME Agency: NA HH Arranged: NA HH Agency: NA   Social Determinants of Health (SDOH) Interventions    Readmission Risk Interventions Readmission Risk Prevention Plan 07/05/2019  Transportation Screening Complete  PCP or Specialist Appt within 3-5 Days Complete  HRI or [place] Complete  Social Work Consult for [place] Planning/Counseling Complete  Palliative Care Screening Not Applicable  Medication Review Press photographer) Complete  PCP or Specialist appointment within 3-5 days of discharge Not Complete  PCP/Specialist Appt Not Complete comments plan for SNF  HRI or [place] Not Complete  HRI or Home Care Consult Pt Refusal Comments plan for SNF  SW Recovery Care/Counseling Consult Complete  Palliative Care Screening Not Applicable  Skilled Nursing Facility Complete  Some recent data might be hidden

## 2019-07-28 DIAGNOSIS — R601 Generalized edema: Secondary | ICD-10-CM | POA: Diagnosis not present

## 2019-07-28 DIAGNOSIS — I5032 Chronic diastolic (congestive) heart failure: Secondary | ICD-10-CM | POA: Diagnosis not present

## 2019-07-28 DIAGNOSIS — A0472 Enterocolitis due to Clostridium difficile, not specified as recurrent: Secondary | ICD-10-CM | POA: Diagnosis not present

## 2019-07-28 DIAGNOSIS — K819 Cholecystitis, unspecified: Secondary | ICD-10-CM | POA: Diagnosis not present

## 2019-07-28 LAB — GLUCOSE, CAPILLARY
Glucose-Capillary: 111 mg/dL — ABNORMAL HIGH (ref 70–99)
Glucose-Capillary: 125 mg/dL — ABNORMAL HIGH (ref 70–99)
Glucose-Capillary: 126 mg/dL — ABNORMAL HIGH (ref 70–99)

## 2019-07-28 LAB — COMPREHENSIVE METABOLIC PANEL
ALT: 9 U/L (ref 0–44)
AST: 18 U/L (ref 15–41)
Albumin: 1.1 g/dL — ABNORMAL LOW (ref 3.5–5.0)
Alkaline Phosphatase: 122 U/L (ref 38–126)
Anion gap: 4 — ABNORMAL LOW (ref 5–15)
BUN: 15 mg/dL (ref 8–23)
CO2: 27 mmol/L (ref 22–32)
Calcium: 7 mg/dL — ABNORMAL LOW (ref 8.9–10.3)
Chloride: 109 mmol/L (ref 98–111)
Creatinine, Ser: 0.95 mg/dL (ref 0.44–1.00)
GFR calc Af Amer: >60 mL/min (ref >60)
GFR calc non Af Amer: >60 mL/min (ref >60)
Glucose, Bld: 114 mg/dL — ABNORMAL HIGH (ref 70–99)
Potassium: 3.2 mmol/L — ABNORMAL LOW (ref 3.5–5.1)
Sodium: 140 mmol/L (ref 135–145)
Total Bilirubin: 0.5 mg/dL (ref 0.3–1.2)
Total Protein: 4.9 g/dL — ABNORMAL LOW (ref 6.5–8.1)

## 2019-07-28 LAB — AEROBIC/ANAEROBIC CULTURE W GRAM STAIN (SURGICAL/DEEP WOUND): Gram Stain: NONE SEEN

## 2019-07-28 LAB — CBC
HCT: 28.7 % — ABNORMAL LOW (ref 36.0–46.0)
Hemoglobin: 8.6 g/dL — ABNORMAL LOW (ref 12.0–15.0)
MCH: 28.2 pg (ref 26.0–34.0)
MCHC: 30 g/dL (ref 30.0–36.0)
MCV: 94.1 fL (ref 80.0–100.0)
Platelets: 438 10*3/uL — ABNORMAL HIGH (ref 150–400)
RBC: 3.05 MIL/uL — ABNORMAL LOW (ref 3.87–5.11)
RDW: 21.9 % — ABNORMAL HIGH (ref 11.5–15.5)
WBC: 8.6 10*3/uL (ref 4.0–10.5)
nRBC: 0 % (ref 0.0–0.2)

## 2019-07-28 LAB — MAGNESIUM: Magnesium: 1.7 mg/dL (ref 1.7–2.4)

## 2019-07-28 MED ORDER — FUROSEMIDE 80 MG PO TABS
80.0000 mg | ORAL_TABLET | Freq: Every day | ORAL | Status: DC
  Administered 2019-07-28 – 2019-07-31 (×4): 80 mg via ORAL
  Filled 2019-07-28 (×5): qty 1

## 2019-07-28 MED ORDER — POTASSIUM CHLORIDE CRYS ER 20 MEQ PO TBCR
40.0000 meq | EXTENDED_RELEASE_TABLET | ORAL | Status: AC
  Administered 2019-07-28 (×2): 40 meq via ORAL
  Filled 2019-07-28 (×2): qty 2

## 2019-07-28 NOTE — TOC Progression Note (Addendum)
Transition of Care [facility] - Progression Note    Patient Details  Name: Alice Kemp MRN: [id number] Date of Birth: [birth year]  Transition of Care [facility]) CM/SW [place], [place] Phone Number: 07/28/2019, 1:08 PM  Clinical Narrative:    CSW has again f/u with [facility] for placement availability- they are looking at bed openings for private room due to pt CDiff.  Per ID pt is outside of CDC window of recovery even if her test is positive and it would benefit her to not be in [place] unit. [facility] aware.   CSW also f/u with Shanon Rosser with financial counseling to see if she would be able to provide assistance to [name] with Medicaid.  Confirmed with [employer] that pt auth is valid thru 10/26 at 5pm.    Expected Discharge Plan: [place] Barriers to Discharge: Ship broker, Continued Medical Work up  Expected Discharge Plan and Services Expected Discharge Plan: [place] In-house Referral: Clinical Social Work Discharge Planning Services: NA Post Acute Care Choice: [place] Living arrangements for the past 2 months: [place] Expected Discharge Date: 07/07/19               DME Arranged: N/A DME Agency: NA  HH Arranged: NA HH Agency: NA   Social Determinants of Health (SDOH) Interventions    Readmission Risk Interventions Readmission Risk Prevention Plan 07/05/2019  Transportation Screening Complete  PCP or Specialist Appt within 3-5 Days Complete  HRI or [place] Complete  Social Work Consult for [place] Planning/Counseling Complete  Palliative Care Screening Not Applicable  Medication Review Press photographer) Complete  PCP or Specialist appointment within 3-5 days of discharge Not Complete  PCP/Specialist Appt Not Complete comments plan for SNF  HRI or [place] Not Complete  HRI or Home Care Consult Pt Refusal Comments plan  for SNF  SW Recovery Care/Counseling Consult Complete  Palliative Care Screening Not Applicable  Skilled Nursing Facility Complete  Some recent data might be hidden

## 2019-07-28 NOTE — Progress Notes (Signed)
PROGRESS NOTE    Alice Kemp  [id number] DOB: January 07, 1951 DOA: 07/04/2019 PCP: [name], MD    Brief Narrative:  68 y.o.Fwith obesity,hypothyroidism, morbid obesity, dyslipidemia, DM, chronic diastolic heart failure, CKD stage III, COVID infection in [place] admitted from home on 8/18-8/30 for spinal stenosis. She underwent lumbar laminectomy and microdiscectomy on 8/24 and was discharged to SNF.This hospitalization was complicated by COVID positivity on restest, alsodeveloped abdominal wound infection/cellulitis.  Shepresentedthis time on 9/28 with AMS/diarrhea/ falls at [facility]. She was again COVID positive and admitted to [facility], ultimately found to have Cdiff with associated dehydration..After treatment at [facility] with oral vancomycin, she developed new acute cholecystitis by [facility]. She has since been seen by GS and is s/p percutaneous drain, remains on IV unasyn. She is also on Fidoxamin and flagyl per ID for C.diff. Home lasix which was held initially has been resumed in concern for fluid overload. Creatinine was upto 1.7 before reinitiating Lasix and fortunately improving (1.3) since initiated on diuretics.  Her albumin level was significantly low at 1.0 making her vulnerable to 3rd spacing.  ALP down trending (349-->123), leucocytosis resolved (20k-->8.7k).    Awaiting further resolution of her diarrhea, and SNF placement.  Subjective 10/22: Patient seen and examined, awake laying in bed.  Reports she feels better today.  Nausea improved, eating a sandwich for lunch and tolerating well.  Reports diarrhea is somewhat less frequent, but still watery.  Denies fevers or chills.  Assessment & Plan:   Principal Problem:   Acalculous cholecystitis Active Problems:   Uncontrolled type 2 diabetes mellitus with stage 3 chronic kidney disease (HCC)   Morbid obesity (HCC)   Hypothyroidism   Depression   CKD (chronic kidney disease), stage III (HCC)  Chronic diastolic heart failure (HCC)   Essential hypertension   AKI (acute kidney injury) ([place])   AMS (altered mental status)   Encephalopathy due to COVID-19 virus   Healing pressure injury, stage 2 (HCC)   Lower abdominal pain, unspecified   Leukocytosis   Clostridium difficile diarrhea   Anasarca   Acute acalculous cholecystitis: S/p percutaneous cholecystectomy tube, IR, 10/17.  Leukocytosis resolved.  Tolerating regular diet but with nausea.  Biliary culture showed rare E. Coli.  ID recommends 5-6 days course post drain placement, given active C diff infection. -Completed 6-day course of Unasyn, will discontinue   Severe C.diff colitis: improving. Followed by ID. Pt reports diarrhea improving, still watery, still with rectal tube in place. - continue fidoxomicin 200 mg PO BID.  - Encourage mobility/frequent bed changes to avoid autoinoculation.  - Per ID , remove rectal tubeif and when stool forming.   - Limit Unasyn course to a short duration -stopped - Per ID-may consider Vanco taper on discharge vs continuing fodoxamin  Recent COVID infection: Positive COVID test results 2 months ago and 3 weeks ago. Not on any treatment currently. Prolonged hospitalization with prolonged debility.  -We will repeat COVID-19 test within 48 hours of discharge to SNF  Anasarca/Ascites/acute on chronic diastolic CHF: Secondary to 3rd spacing from severe hypoalbuminemia.  Patient has significantly edematous left>right hand and b/l lower extremities, slightly improved today. - IV fluids d/c'd - cont home dose lasix 80 mg PO daily - monitor volume status closely  Acute Kidney Injury -resolved: creatinine peaked to 2.0 in earlier hospital course likely due to dehydration, then normalized with IV fluids. It started to uptrend again to 1.7 past week -likely congested kidney this time in the setting of  aggressive IV hydration. Improved with lasix.  - BMP in AM to monitor - avoid nephrotoxins &  renally dose meds as indicated  Hypothyroidism:Continuelevothyroxine  ?Orthostatic hypotension: patient on midodrine.SBP 110s.   Diabetes with neuropathy: on SSI/neurontin. On asa as ?preventive measure  Depression:ContinueWellbutrin, duloxetine  Normocytic Anemia : Chronic GI losses vs AOCD. Stable around 8--8.4. Ferritin 141, last fe level 78 and TIBC 292, folate 9.7 in Aug.  No evidence of active blood loss.  Severe PCM :Albumin level too low at 1.0. Dietery evaluation. Unlikely a good candidate for TPN given ongoing infections. - dietary supplements added - encourage use  Stage II coccyx pressure injury, not POA; local wound care    DVT prophylaxis: Lovenox  Code Status:   Code Status: DNR Family Communication: None at bedside Disposition Plan: Still with rectal tube, financial constraints applying for Medicaid, and awaiting SNF bed availability.  If SNF can take patient with rectal tube, she is medically stable for discharge.   Consultants:   Infectious disease  Interventional radiology  Procedures:   Percutaneous biliary drain by IR  Antimicrobials:   Unasyn, start 10/15, stop 10/22  Fidaxomicin per ID    Objective: Vitals:   07/27/19 1156 07/27/19 1500 07/27/19 2002 07/28/19 0540  BP: 123/86 123/86 110/67 101/66  Pulse: 95 95 93 (!) 104  Resp:  18 18 18   Temp:  98.1 F (36.7 C) 97.9 F (36.6 C) 97.8 F (36.6 C)  TempSrc:  Oral Oral Oral  SpO2: 99% 99% 96% 94%  Weight:      Height:        Intake/Output Summary (Last 24 hours) at 07/28/2019 1438 Last data filed at 07/28/2019 1400 Gross per 24 hour  Intake -  Output 925 ml  Net -925 ml   Filed Weights   07/21/19 0500 07/22/19 0500 07/23/19 0500  Weight: 128.6 kg 128.6 kg 127 kg    Examination:  General exam: awake, alert, no acute distress, morbidly obese HEENT: atraumatic, clear conjunctiva, anicteric sclera, moist mucus membranes, hearing grossly normal  Respiratory  system: clear to auscultation bilaterally, no wheezes, rales or rhonchi, normal respiratory effort. Cardiovascular system: normal S1/S2, RRR, no JVD, murmurs, rubs, gallops, b/l lower extremity nonpitting edema.   Gastrointestinal system: soft, non-tender, non-distended abdomen, hyperactive bowel sounds.  Rectal tube in place, watery stool in bag. Central nervous system: alert and oriented x4. no gross focal neurologic deficits, normal speech Extremities: moves all, slightly improved left upper extremity edema, normal tone Skin: dry, intact, normal temperature Psychiatry: Depressed mood, congruent affect, judgement and insight appear normal    Data Reviewed: I have personally reviewed following labs and imaging studies  CBC: Recent Labs  Lab 07/23/19 0359 07/24/19 0853 07/25/19 0425 07/26/19 0440 07/27/19 0413 07/28/19 0440  WBC 16.1* 10.5 8.7 9.5 8.6 8.6  NEUTROABS 10.8* 9.0*  --   --   --   --   HGB 8.1* 8.0* 8.1* 8.4* 8.7* 8.6*  HCT 25.9* 26.8* 26.7* 29.4*  28.4* 28.7* 28.7*  MCV 90.9 93.4 92.4 96.1 94.1 94.1  PLT 375 351 363 425* 419* [id number]*   Basic Metabolic Panel: Recent Labs  Lab 07/24/19 0853 07/25/19 0425 07/26/19 0440 07/27/19 0413 07/28/19 0440  NA 138 140 140 139 140  K 4.0 3.6 3.5 3.4* 3.2*  CL 107 110 110 109 109  CO2 24 25 25 26 27   GLUCOSE 105* 126* 131* 149* 114*  BUN 19 18 15 16 15   CREATININE 1.40* 1.31* 1.13* 1.07* 0.95  CALCIUM 7.0* 6.8* 7.2* 7.1* 7.0*  MG  --   --   --   --  1.7   GFR: Estimated Creatinine Clearance: 73.8 mL/min (by C-G formula based on SCr of 0.95 mg/dL). Liver Function Tests: Recent Labs  Lab 07/24/19 0853 07/25/19 0425 07/26/19 0440 07/27/19 0413 07/28/19 0440  AST 17 17 15 15 18   ALT 12 12 10 10 9   ALKPHOS 140* 128* 129* 136* 122  BILITOT 0.8 0.2* 0.4 0.3 0.5  PROT 4.7* 4.7* 4.9* 4.9* 4.9*  ALBUMIN 1.1* 1.0* 1.1* 1.1* 1.1*   No results for input(s): LIPASE, AMYLASE in the last 168 hours. No results for input(s):  AMMONIA in the last 168 hours. Coagulation Profile: Recent Labs  Lab 07/23/19 1420  INR 1.4*   Cardiac Enzymes: No results for input(s): CKTOTAL, CKMB, CKMBINDEX, TROPONINI in the last 168 hours. BNP (last 3 results) No results for input(s): PROBNP in the last 8760 hours. HbA1C: No results for input(s): HGBA1C in the last 72 hours. CBG: Recent Labs  Lab 07/27/19 1203 07/27/19 1731 07/27/19 2353 07/28/19 0539 07/28/19 1346  GLUCAP 120* 145* 134* 111* 125*   Lipid Profile: No results for input(s): CHOL, HDL, LDLCALC, TRIG, CHOLHDL, LDLDIRECT in the last 72 hours. Thyroid Function Tests: No results for input(s): TSH, T4TOTAL, FREET4, T3FREE, THYROIDAB in the last 72 hours. Anemia Panel: Recent Labs    07/26/19 0440  VITAMINB12 1,401*  TIBC 106*  IRON 33   Sepsis Labs: No results for input(s): PROCALCITON, LATICACIDVEN in the last 168 hours.  Recent Results (from the past 240 hour(s))  Aerobic/Anaerobic Culture (surgical/deep wound)     Status: None   Collection Time: 07/23/19  5:02 PM   Specimen: BILE  Result Value Ref Range Status   Specimen Description BILE  Final   Special Requests NONE  Final   Gram Stain NO WBC SEEN RARE GRAM NEGATIVE RODS   Final   Culture   Final    RARE ESCHERICHIA COLI NO ANAEROBES ISOLATED Performed at [facility], [address], [place], [place] [address]    Report Status 07/28/2019 FINAL  Final   Organism ID, Bacteria ESCHERICHIA COLI  Final      Susceptibility   Escherichia coli - MIC*    AMPICILLIN 4 SENSITIVE Sensitive     CEFAZOLIN <=4 SENSITIVE Sensitive     CEFEPIME <=1 SENSITIVE Sensitive     CEFTAZIDIME <=1 SENSITIVE Sensitive     CEFTRIAXONE <=1 SENSITIVE Sensitive     CIPROFLOXACIN <=0.25 SENSITIVE Sensitive     GENTAMICIN <=1 SENSITIVE Sensitive     IMIPENEM <=0.25 SENSITIVE Sensitive     TRIMETH/SULFA <=20 SENSITIVE Sensitive     AMPICILLIN/SULBACTAM <=2 SENSITIVE Sensitive     PIP/TAZO <=4 SENSITIVE  Sensitive     Extended ESBL NEGATIVE Sensitive     * RARE ESCHERICHIA COLI  Culture, body fluid-bottle     Status: None (Preliminary result)   Collection Time: 07/23/19  5:55 PM   Specimen: Pleura  Result Value Ref Range Status   Specimen Description PLEURAL FLUID  Final   Special Requests   Final    BOTTLES DRAWN AEROBIC AND ANAEROBIC Blood Culture adequate volume   Culture   Final    NO GROWTH 4 DAYS Performed at [facility], [address] [place], [place] [address]    Report Status PENDING  Incomplete  Gram stain     Status: None   Collection Time: 07/23/19  5:55 PM  Specimen: Pleura  Result Value Ref Range Status   Specimen Description PLEURAL FLUID  Final   Special Requests NONE  Final   Gram Stain   Final    FEW WBC PRESENT, PREDOMINANTLY PMN NO ORGANISMS SEEN Performed at [facility], [address], [place], [place] [address]    Report Status 07/24/2019 FINAL  Final         Radiology Studies: No results found.      Scheduled Meds: . aspirin  81 mg Oral Daily  . buPROPion  100 mg Oral BID  . Chlorhexidine Gluconate Cloth  6 each Topical Daily  . DULoxetine  30 mg Oral BID  . enoxaparin (LOVENOX) injection  60 mg Subcutaneous Q24H  . feeding supplement (ENSURE ENLIVE)  237 mL Oral TID BM  . feeding supplement (PRO-STAT SUGAR FREE 64)  30 mL Oral TID WC  . fidaxomicin  200 mg Oral BID  . gabapentin  300 mg Oral TID  . Gerhardt's butt cream  1 application Topical TID  . insulin aspart  0-15 Units Subcutaneous Q6H  . levothyroxine  88 mcg Oral QAC breakfast  . midodrine  10 mg Oral BID WC  . multivitamin with minerals  1 tablet Oral Daily  . rosuvastatin  5 mg Oral Daily  . sodium chloride flush  10-40 mL Intracatheter Q12H  . sodium chloride flush  5 mL Intracatheter Q8H  . sucralfate  1 g Oral TID WC & HS   Continuous Infusions:   LOS: 24 days    Time spent: 35 to 40 minutes    Ezekiel Slocumb, DO [facility] Pager:  [phone]  If 7PM-7AM, please contact night-coverage [email or web address] Password [facility] 07/28/2019, 2:38 PM

## 2019-07-29 DIAGNOSIS — A0472 Enterocolitis due to Clostridium difficile, not specified as recurrent: Secondary | ICD-10-CM | POA: Diagnosis not present

## 2019-07-29 DIAGNOSIS — K819 Cholecystitis, unspecified: Secondary | ICD-10-CM | POA: Diagnosis not present

## 2019-07-29 DIAGNOSIS — R601 Generalized edema: Secondary | ICD-10-CM | POA: Diagnosis not present

## 2019-07-29 DIAGNOSIS — I5032 Chronic diastolic (congestive) heart failure: Secondary | ICD-10-CM | POA: Diagnosis not present

## 2019-07-29 LAB — COMPREHENSIVE METABOLIC PANEL
ALT: 11 U/L (ref 0–44)
AST: 19 U/L (ref 15–41)
Albumin: 1.2 g/dL — ABNORMAL LOW (ref 3.5–5.0)
Alkaline Phosphatase: 109 U/L (ref 38–126)
Anion gap: 6 (ref 5–15)
BUN: 16 mg/dL (ref 8–23)
CO2: 26 mmol/L (ref 22–32)
Calcium: 7.1 mg/dL — ABNORMAL LOW (ref 8.9–10.3)
Chloride: 107 mmol/L (ref 98–111)
Creatinine, Ser: 0.94 mg/dL (ref 0.44–1.00)
GFR calc Af Amer: >60 mL/min (ref >60)
GFR calc non Af Amer: >60 mL/min (ref >60)
Glucose, Bld: 114 mg/dL — ABNORMAL HIGH (ref 70–99)
Potassium: 3.8 mmol/L (ref 3.5–5.1)
Sodium: 139 mmol/L (ref 135–145)
Total Bilirubin: 0.2 mg/dL — ABNORMAL LOW (ref 0.3–1.2)
Total Protein: 5 g/dL — ABNORMAL LOW (ref 6.5–8.1)

## 2019-07-29 LAB — GLUCOSE, CAPILLARY
Glucose-Capillary: 103 mg/dL — ABNORMAL HIGH (ref 70–99)
Glucose-Capillary: 141 mg/dL — ABNORMAL HIGH (ref 70–99)
Glucose-Capillary: 143 mg/dL — ABNORMAL HIGH (ref 70–99)
Glucose-Capillary: 93 mg/dL (ref 70–99)

## 2019-07-29 LAB — CBC
HCT: 30.8 % — ABNORMAL LOW (ref 36.0–46.0)
Hemoglobin: 8.8 g/dL — ABNORMAL LOW (ref 12.0–15.0)
MCH: 27.3 pg (ref 26.0–34.0)
MCHC: 28.6 g/dL — ABNORMAL LOW (ref 30.0–36.0)
MCV: 95.7 fL (ref 80.0–100.0)
Platelets: 512 10*3/uL — ABNORMAL HIGH (ref 150–400)
RBC: 3.22 MIL/uL — ABNORMAL LOW (ref 3.87–5.11)
RDW: 21.7 % — ABNORMAL HIGH (ref 11.5–15.5)
WBC: 9.3 10*3/uL (ref 4.0–10.5)
nRBC: 0 % (ref 0.0–0.2)

## 2019-07-29 LAB — SARS CORONAVIRUS 2 (TAT 6-24 HRS): SARS Coronavirus 2: NEGATIVE

## 2019-07-29 LAB — MAGNESIUM: Magnesium: 1.5 mg/dL — ABNORMAL LOW (ref 1.7–2.4)

## 2019-07-29 MED ORDER — MAGNESIUM SULFATE 4 GM/100ML IV SOLN
4.0000 g | Freq: Once | INTRAVENOUS | Status: AC
  Administered 2019-07-29: 4 g via INTRAVENOUS
  Filled 2019-07-29: qty 100

## 2019-07-29 MED ORDER — POTASSIUM CHLORIDE CRYS ER 20 MEQ PO TBCR
40.0000 meq | EXTENDED_RELEASE_TABLET | Freq: Once | ORAL | Status: AC
  Administered 2019-07-29: 13:00:00 40 meq via ORAL
  Filled 2019-07-29 (×3): qty 2

## 2019-07-29 NOTE — Plan of Care (Signed)
  Problem: Respiratory: Goal: Will maintain a patent airway Outcome: Progressing   

## 2019-07-29 NOTE — Progress Notes (Signed)
Occupational Therapy Treatment Patient Details Name: Alice Kemp MRN: [id number] DOB: 1951-04-28 Today's Date: 07/29/2019    History of present illness 68 y/o female pt w/ hx of anxiety, arthritis, back pain, benign neoplasm of colon, breast abscess, cervical radiculopathy, CKD III, depression, diastolic HF, DM II, glaucoma, gout, HLD, morbid obesity, obstructive sleep apnea, pericardial effusion, pulmonary HTN, hx of COVID in 06/20. pt underwent decompressive laminectomy L3 partial inferior laminectomy L2 and microscopic discectomy from the left and foraminotomies of the left L3 nerve root on 05/30/19. She had a fall on 07/02/2019, admitted with AMS, hypotension; C-diff.    OT comments  Pt anxious when attempting to sit at EOB, began pushing posterior. Returned to supine and completed UE exercises with level 2 theraband. Continues to be appropriate for SNF level rehab.  Follow Up Recommendations  SNF;Supervision/Assistance - 24 hour    Equipment Recommendations  None recommended by OT    Recommendations for Other Services      Precautions / Restrictions Precautions Precautions: Fall Precaution Comments: incontinence, low BP, flexiseal Restrictions Weight Bearing Restrictions: No       Mobility Bed Mobility Overal bed mobility: Needs Assistance Bed Mobility: Rolling;Sidelying to Sit;Sit to Sidelying Rolling: Max assist Sidelying to sit: Max assist     Sit to sidelying: Mod assist General bed mobility comments: pt became anxious with attempt to sit at EOB, returned to supine and did UE ex in supine  Transfers                      Balance Overall balance assessment: Needs assistance   Sitting balance-Leahy Scale: Zero Sitting balance - Comments: began pushing posterior when attempt to sit at EOB                                   ADL either performed or assessed with clinical judgement   ADL                                               Vision       Perception     Praxis      Cognition Arousal/Alertness: Awake/alert Behavior During Therapy: WFL for tasks assessed/performed Overall Cognitive Status: Within Functional Limits for tasks assessed                                          Exercises Exercises: General Upper Extremity General Exercises - Upper Extremity Shoulder Flexion: Strengthening;Both;10 reps;Supine;Theraband Theraband Level (Shoulder Flexion): Level 2 (Red) Shoulder Horizontal ABduction: Strengthening;Both;10 reps;Supine;Theraband Theraband Level (Shoulder Horizontal Abduction): Level 2 (Red) Elbow Flexion: Strengthening;Both;10 reps;Supine;Theraband Theraband Level (Elbow Flexion): Level 2 (Red) Elbow Extension: Strengthening;Both;10 reps;Supine;Theraband Theraband Level (Elbow Extension): Level 2 (Red)   Shoulder Instructions       General Comments      Pertinent Vitals/ Pain       Pain Assessment: Faces Faces Pain Scale: Hurts little more Pain Location: abdomen Pain Descriptors / Indicators: Discomfort;Grimacing;Sore Pain Intervention(s): Monitored during session;Repositioned  Home Living  Prior Functioning/Environment              Frequency  Min 2X/week        Progress Toward Goals  OT Goals(current goals can now be found in the care plan section)  Progress towards OT goals: Progressing toward goals  Acute Rehab OT Goals Patient Stated Goal: to get back home to her mom and dogs OT Goal Formulation: With patient Time For Goal Achievement: 08/04/19 Potential to Achieve Goals: Good  Plan Discharge plan remains appropriate    Co-evaluation                 AM-PAC OT "6 Clicks" Daily Activity     Outcome Measure   Help from another person eating meals?: None Help from another person taking care of personal grooming?: A Little Help from another person toileting, which  includes using toliet, bedpan, or urinal?: Total Help from another person bathing (including washing, rinsing, drying)?: A Lot Help from another person to put on and taking off regular upper body clothing?: A Lot Help from another person to put on and taking off regular lower body clothing?: Total 6 Click Score: 13    End of Session    OT Visit Diagnosis: Muscle weakness (generalized) (M62.81);Pain   Activity Tolerance Patient tolerated treatment well(limited by weakness)   Patient Left in bed;with call bell/phone within reach   Nurse Communication          Time: [id number] OT Time Calculation (min): 30 min  Charges: OT General Charges $OT Visit: 1 Visit OT Treatments $Therapeutic Activity: 8-22 mins $Therapeutic Exercise: 8-22 mins  Nestor Lewandowsky, OTR/L Acute Rehabilitation Services Pager: [phone] Office: [phone]   Alice Kemp 07/29/2019, 4:16 PM

## 2019-07-29 NOTE — Progress Notes (Signed)
PROGRESS NOTE    Alice Kemp  [id number] DOB: 05-04-1951 DOA: 07/04/2019 PCP: [name], MD    Brief Narrative:  68 y.o.Fwith obesity,hypothyroidism, morbid obesity, dyslipidemia, DM, chronic diastolic heart failure, CKD stage III, COVID infection in [place] admitted from home on 8/18-8/30 for spinal stenosis. She underwent lumbar laminectomy and microdiscectomy on 8/24 and was discharged to SNF.This hospitalization was complicated by COVID positivity on restest, alsodeveloped abdominal wound infection/cellulitis.  Shepresentedthis time on 9/28 with AMS/diarrhea/ falls at [facility]. She was again COVID positive and admitted to [facility], ultimately found to have Cdiff with associated dehydration..After treatment at [facility] with oral vancomycin, she developed new acute cholecystitis [facility]. She has since been seen by GS and is s/p percutaneous drain, remains on IV unasyn. She is also on Fidoxamin and flagyl per ID for C.diff. Home lasix which was held initially has been resumed in concern for fluid overload. Creatinine was upto 1.7 before reinitiating Lasix and fortunately improving (1.3) since initiated on diuretics. Her albumin level was significantly low at 1.0 making her vulnerable to 3rd spacing. ALP down trending (349-->123), leucocytosis resolved (20k-->8.7k).  Awaiting further resolution of her diarrhea, and SNF placement.  Subjective: Patient seen awake laying in bed watching TV.  Reports feeling well.  Intermittently with mild nausea but no vomiting.  Tolerating diet.  Reports diarrhea is still very watery although less frequent.  Denies fevers or chills, abdominal pain, chest pain, shortness of breath.  Denies any issues with biliary drain.  Still with rectal tube in place.  Assessment & Plan:   Principal Problem:   Acalculous cholecystitis Active Problems:   Uncontrolled type 2 diabetes mellitus with stage 3 chronic kidney disease (HCC)  Morbid obesity (HCC)   Hypothyroidism   Depression   CKD (chronic kidney disease), stage III (HCC)   Chronic diastolic heart failure (HCC)   Essential hypertension   AKI (acute kidney injury) ([place])   AMS (altered mental status)   Encephalopathy due to COVID-19 virus   Healing pressure injury, stage 2 (HCC)   Lower abdominal pain, unspecified   Leukocytosis   Clostridium difficile diarrhea   Anasarca   Acute acalculous cholecystitis: S/p percutaneous cholecystectomy tube, IR, 10/17.Leukocytosis resolved. Tolerating regular diet but with nausea.  Biliary culture showed rare E. Coli.  ID recommends 5-6 days course post drain placement, given active C diff infection. -Completed 6-day course of Unasyn   Severe C.diff colitis:Slowly improving. Followed by ID.Pt reports diarrhea improving, still watery, still with rectal tube in place. - continuefidoxomicin200 mg POBID. -Encourage mobility/frequent bed changes to avoid autoinoculation. -Per ID , remove rectal tubeif and when stool forming. -Per ID-may consider Vanco taper on discharge vs continuing fidoxomicin  Recent COVID infection: Positive COVID test results 2 months ago and 3 weeks ago. Not on any treatment currently. Prolonged hospitalization with prolonged debility.  -Repeat Covid test pending for SNF discharge  Anasarca/Ascites/acute on chronic diastolic CHF:  Slightly improved.  Secondary to 3rd spacing from severe hypoalbuminemia.Patient has significantly edematous left>right hand and b/l lower extremities, slightly improved today. -IV fluidsd/c'd - cont home doselasix 80 mg PO daily - monitor volume status closely  AcuteKidneyInjury -resolved: creatinine peaked to 2.0 in earlier hospital course likely due to dehydration, then normalized with IV fluids. It started to uptrend again to 1.7 -likely congested kidney this time in the setting of aggressive IV hydration. Improved with lasix. - BMP in AM to  monitor  Hypothyroidism:Continuelevothyroxine  ?Orthostatic hypotension: patient on midodrine.SBP  110s.   Diabetes with neuropathy: on SSI/neurontin. On asa as ?preventive measure  Depression:ContinueWellbutrin, duloxetine  Normocytic Anemia : Chronic GI losses vs AOCD. Stable around 8--8.4. Ferritin 141, last fe level 78 and TIBC 292, folate 9.7 in Aug.  No evidence of active blood loss.  Severe PCM :Albumin level too low at 1.0. Dietery evaluation.Unlikely agood candidate for TPN given ongoing infections. - dietary supplements added - encourage use  Stage II coccyx pressure injury, not POA; local wound care    DVT prophylaxis: Lovenox  Code Status:   Code Status: DNR Family Communication: None at bedside Disposition Plan: Still with rectal tube, financial constraints applying for Medicaid, and awaiting SNF bed availability.  If SNF can take patient with rectal tube, she is medically stable for discharge.   Consultants:   Infectious disease  Interventional radiology  Procedures:   Percutaneous biliary drain by IR  Antimicrobials:   Unasyn, start 10/15, stop 10/22  Fidaxomicin per ID   Objective: Vitals:   07/28/19 1900 07/28/19 2257 07/29/19 0500 07/29/19 0644  BP: 109/66 123/76  121/66  Pulse: (!) 102 (!) 101  (!) 102  Resp:  19  18  Temp: 98 F (36.7 C) 98 F (36.7 C)  97.6 F (36.4 C)  TempSrc: Oral Oral  Oral  SpO2: 95% 100%  96%  Weight:   127.2 kg   Height:        Intake/Output Summary (Last 24 hours) at 07/29/2019 0934 Last data filed at 07/29/2019 0649 Gross per 24 hour  Intake 380 ml  Output 1390 ml  Net -1010 ml   Filed Weights   07/22/19 0500 07/23/19 0500 07/29/19 0500  Weight: 128.6 kg 127 kg 127.2 kg    Examination:  General exam: awake, alert, no acute distress Respiratory system: clear to auscultation, normal respiratory effort. Cardiovascular system: normal S1/S2, RRR, no JVD, murmurs, rubs, gallops,  bilateral lower extremity pitting edema.   Gastrointestinal system: soft, non-tender, non-distended, normal bowel sounds, percutaneous biliary drain in place with clean dry and intact dressing. Central nervous system: alert and oriented x4. no gross focal neurologic deficits, normal speech Extremities: moves all , normal tone Psychiatry: normal mood, congruent affect, judgement and insight appear normal    Data Reviewed: I have personally reviewed following labs and imaging studies  CBC: Recent Labs  Lab 07/23/19 0359 07/24/19 0853 07/25/19 0425 07/26/19 0440 07/27/19 0413 07/28/19 0440 07/29/19 0533  WBC 16.1* 10.5 8.7 9.5 8.6 8.6 9.3  NEUTROABS 10.8* 9.0*  --   --   --   --   --   HGB 8.1* 8.0* 8.1* 8.4* 8.7* 8.6* 8.8*  HCT 25.9* 26.8* 26.7* 29.4*  28.4* 28.7* 28.7* 30.8*  MCV 90.9 93.4 92.4 96.1 94.1 94.1 95.7  PLT 375 351 363 425* 419* 438* [id number]*   Basic Metabolic Panel: Recent Labs  Lab 07/25/19 0425 07/26/19 0440 07/27/19 0413 07/28/19 0440 07/29/19 0533  NA 140 140 139 140 139  K 3.6 3.5 3.4* 3.2* 3.8  CL 110 110 109 109 107  CO2 25 25 26 27 26   GLUCOSE 126* 131* 149* 114* 114*  BUN 18 15 16 15 16   CREATININE 1.31* 1.13* 1.07* 0.95 0.94  CALCIUM 6.8* 7.2* 7.1* 7.0* 7.1*  MG  --   --   --  1.7 1.5*   GFR: Estimated Creatinine Clearance: 74.6 mL/min (by C-G formula based on SCr of 0.94 mg/dL). Liver Function Tests: Recent Labs  Lab 07/25/19 0425 07/26/19 0440 07/27/19 0413 07/28/19  0440 07/29/19 0533  AST 17 15 15 18 19   ALT 12 10 10 9 11   ALKPHOS 128* 129* 136* 122 109  BILITOT 0.2* 0.4 0.3 0.5 0.2*  PROT 4.7* 4.9* 4.9* 4.9* 5.0*  ALBUMIN 1.0* 1.1* 1.1* 1.1* 1.2*   No results for input(s): LIPASE, AMYLASE in the last 168 hours. No results for input(s): AMMONIA in the last 168 hours. Coagulation Profile: Recent Labs  Lab 07/23/19 1420  INR 1.4*   Cardiac Enzymes: No results for input(s): CKTOTAL, CKMB, CKMBINDEX, TROPONINI in the last 168  hours. BNP (last 3 results) No results for input(s): PROBNP in the last 8760 hours. HbA1C: No results for input(s): HGBA1C in the last 72 hours. CBG: Recent Labs  Lab 07/28/19 0539 07/28/19 1346 07/28/19 1722 07/29/19 0013 07/29/19 0641  GLUCAP 111* 125* 126* 143* 103*   Lipid Profile: No results for input(s): CHOL, HDL, LDLCALC, TRIG, CHOLHDL, LDLDIRECT in the last 72 hours. Thyroid Function Tests: No results for input(s): TSH, T4TOTAL, FREET4, T3FREE, THYROIDAB in the last 72 hours. Anemia Panel: No results for input(s): VITAMINB12, FOLATE, FERRITIN, TIBC, IRON, RETICCTPCT in the last 72 hours. Sepsis Labs: No results for input(s): PROCALCITON, LATICACIDVEN in the last 168 hours.  Recent Results (from the past 240 hour(s))  Aerobic/Anaerobic Culture (surgical/deep wound)     Status: None   Collection Time: 07/23/19  5:02 PM   Specimen: BILE  Result Value Ref Range Status   Specimen Description BILE  Final   Special Requests NONE  Final   Gram Stain NO WBC SEEN RARE GRAM NEGATIVE RODS   Final   Culture   Final    RARE ESCHERICHIA COLI NO ANAEROBES ISOLATED Performed at [facility], [address] [place], [place] [address]    Report Status 07/28/2019 FINAL  Final   Organism ID, Bacteria ESCHERICHIA COLI  Final      Susceptibility   Escherichia coli - MIC*    AMPICILLIN 4 SENSITIVE Sensitive     CEFAZOLIN <=4 SENSITIVE Sensitive     CEFEPIME <=1 SENSITIVE Sensitive     CEFTAZIDIME <=1 SENSITIVE Sensitive     CEFTRIAXONE <=1 SENSITIVE Sensitive     CIPROFLOXACIN <=0.25 SENSITIVE Sensitive     GENTAMICIN <=1 SENSITIVE Sensitive     IMIPENEM <=0.25 SENSITIVE Sensitive     TRIMETH/SULFA <=20 SENSITIVE Sensitive     AMPICILLIN/SULBACTAM <=2 SENSITIVE Sensitive     PIP/TAZO <=4 SENSITIVE Sensitive     Extended ESBL NEGATIVE Sensitive     * RARE ESCHERICHIA COLI  Culture, body fluid-bottle     Status: None (Preliminary result)   Collection Time: 07/23/19   5:55 PM   Specimen: Pleura  Result Value Ref Range Status   Specimen Description PLEURAL FLUID  Final   Special Requests   Final    BOTTLES DRAWN AEROBIC AND ANAEROBIC Blood Culture adequate volume   Culture   Final    NO GROWTH 4 DAYS Performed at [facility], [address], [place], [place] [address]    Report Status PENDING  Incomplete  Gram stain     Status: None   Collection Time: 07/23/19  5:55 PM   Specimen: Pleura  Result Value Ref Range Status   Specimen Description PLEURAL FLUID  Final   Special Requests NONE  Final   Gram Stain   Final    FEW WBC PRESENT, PREDOMINANTLY PMN NO ORGANISMS SEEN Performed at [facility], [place] [address], [place], [place]  [id number]    Report Status 07/24/2019 FINAL  Final         Radiology Studies: No results found.      Scheduled Meds: . aspirin  81 mg Oral Daily  . buPROPion  100 mg Oral BID  . Chlorhexidine Gluconate Cloth  6 each Topical Daily  . DULoxetine  30 mg Oral BID  . enoxaparin (LOVENOX) injection  60 mg Subcutaneous Q24H  . feeding supplement (ENSURE ENLIVE)  237 mL Oral TID BM  . feeding supplement (PRO-STAT SUGAR FREE 64)  30 mL Oral TID WC  . fidaxomicin  200 mg Oral BID  . furosemide  80 mg Oral Daily  . gabapentin  300 mg Oral TID  . Gerhardt's butt cream  1 application Topical TID  . insulin aspart  0-15 Units Subcutaneous Q6H  . levothyroxine  88 mcg Oral QAC breakfast  . midodrine  10 mg Oral BID WC  . multivitamin with minerals  1 tablet Oral Daily  . potassium chloride  40 mEq Oral Once  . rosuvastatin  5 mg Oral Daily  . sodium chloride flush  10-40 mL Intracatheter Q12H  . sodium chloride flush  5 mL Intracatheter Q8H  . sucralfate  1 g Oral TID WC & HS   Continuous Infusions: . magnesium sulfate bolus IVPB       LOS: 25 days    Time spent: 30 to 35 minutes    Ezekiel Slocumb, DO [facility] Pager: [phone]  If 7PM-7AM, please contact night-coverage  [email or web address] Password [facility] 07/29/2019, 9:34 AM

## 2019-07-29 NOTE — TOC Progression Note (Signed)
Transition of Care [facility]) - Progression Note    Patient Details  Name: Alice Kemp MRN: [id number] Date of Birth: 1951-04-24  Transition of Care [facility]) CM/SW [place], [place] Phone Number: 07/29/2019, 10:33 AM  Clinical Narrative:    CSW spoke with pt via telephone due to precautions.  Pt understands that [facility] has offered placement but they are awaiting new COVID result and private room availability.  At this time they do not have a private room but are going to check for availability through the weekend. Insurance Josem Kaufmann is valid through 10/26 at 5pm. Pt states understanding, is still amenable to her brother assisting with Medicaid and any additional supports she needs.   CSW continuing to follow. Did mention PMT consult to provider Dr. Arbutus Ped who state she would bring it to pt attention to assess openness to this discussion.    Expected Discharge Plan: Skilled Nursing Facility Barriers to Discharge: SNF Pending bed offer, Continued Medical Work up  Expected Discharge Plan and Services Expected Discharge Plan: [place] In-house Referral: Clinical Social Work Discharge Planning Services: NA Post Acute Care Choice: [place] Living arrangements for the past 2 months: [place] Expected Discharge Date: 07/07/19               DME Arranged: N/A DME Agency: NA HH Arranged: NA HH Agency: NA   Social Determinants of Health (SDOH) Interventions    Readmission Risk Interventions Readmission Risk Prevention Plan 07/05/2019  Transportation Screening Complete  PCP or Specialist Appt within 3-5 Days Complete  HRI or [place] Complete  Social Work Consult for [place] Planning/Counseling Complete  Palliative Care Screening Not Applicable  Medication Review Press photographer) Complete  PCP or Specialist appointment within 3-5 days of discharge Not Complete  PCP/Specialist Appt Not Complete comments  plan for SNF  HRI or [place] Not Complete  HRI or Home Care Consult Pt Refusal Comments plan for SNF  SW Recovery Care/Counseling Consult Complete  Palliative Care Screening Not Applicable  Skilled Nursing Facility Complete  Some recent data might be hidden

## 2019-07-30 DIAGNOSIS — A0472 Enterocolitis due to Clostridium difficile, not specified as recurrent: Secondary | ICD-10-CM | POA: Diagnosis not present

## 2019-07-30 DIAGNOSIS — K819 Cholecystitis, unspecified: Secondary | ICD-10-CM | POA: Diagnosis not present

## 2019-07-30 DIAGNOSIS — I5032 Chronic diastolic (congestive) heart failure: Secondary | ICD-10-CM | POA: Diagnosis not present

## 2019-07-30 DIAGNOSIS — R601 Generalized edema: Secondary | ICD-10-CM | POA: Diagnosis not present

## 2019-07-30 LAB — BASIC METABOLIC PANEL
Anion gap: 5 (ref 5–15)
BUN: 15 mg/dL (ref 8–23)
CO2: 28 mmol/L (ref 22–32)
Calcium: 7.2 mg/dL — ABNORMAL LOW (ref 8.9–10.3)
Chloride: 105 mmol/L (ref 98–111)
Creatinine, Ser: 0.96 mg/dL (ref 0.44–1.00)
GFR calc Af Amer: >60 mL/min (ref >60)
GFR calc non Af Amer: >60 mL/min (ref >60)
Glucose, Bld: 109 mg/dL — ABNORMAL HIGH (ref 70–99)
Potassium: 3.8 mmol/L (ref 3.5–5.1)
Sodium: 138 mmol/L (ref 135–145)

## 2019-07-30 LAB — GLUCOSE, CAPILLARY
Glucose-Capillary: 117 mg/dL — ABNORMAL HIGH (ref 70–99)
Glucose-Capillary: 131 mg/dL — ABNORMAL HIGH (ref 70–99)
Glucose-Capillary: 140 mg/dL — ABNORMAL HIGH (ref 70–99)
Glucose-Capillary: 158 mg/dL — ABNORMAL HIGH (ref 70–99)
Glucose-Capillary: 163 mg/dL — ABNORMAL HIGH (ref 70–99)

## 2019-07-30 LAB — MAGNESIUM: Magnesium: 1.8 mg/dL (ref 1.7–2.4)

## 2019-07-30 MED ORDER — LOPERAMIDE HCL 2 MG PO CAPS
4.0000 mg | ORAL_CAPSULE | Freq: Once | ORAL | Status: AC
  Administered 2019-07-30: 4 mg via ORAL
  Filled 2019-07-30: qty 2

## 2019-07-30 MED ORDER — FUROSEMIDE 80 MG PO TABS
80.0000 mg | ORAL_TABLET | Freq: Once | ORAL | Status: AC
  Administered 2019-07-30: 18:00:00 80 mg via ORAL

## 2019-07-30 NOTE — Progress Notes (Signed)
PROGRESS NOTE    Alice Kemp  [id number] DOB: 11/28/1950 DOA: 07/04/2019  PCP: [name], MD    LOS - 26   Brief Narrative:  68 y.o.Fwith obesity,hypothyroidism, morbid obesity, dyslipidemia, DM, chronic diastolic heart failure, CKD stage III, COVID infection in [place] admitted from home on 8/18-8/30 for spinal stenosis. She underwent lumbar laminectomy and microdiscectomy on 8/24 and was discharged to SNF.This hospitalization was complicated by COVID positivity on restest, alsodeveloped abdominal wound infection/cellulitis.  Shepresentedthis time on 9/28 with AMS/diarrhea/ falls at [facility]. She was again COVID positive and admitted to [facility], ultimately found to have Cdiff with associated dehydration..After treatment at [facility] with oral vancomycin, she developed new acute cholecystitis [facility]. She has since been seen by [facility] and is s/p percutaneous drain, remains on IV unasyn. She is also on Fidoxamin and flagyl per ID for C.diff. Home lasix which was held initially has been resumed in concern for fluid overload. Creatinine was upto 1.7 before reinitiating Lasix and fortunately improving (1.3) since initiated on diuretics. Her albumin level was significantly low at 1.0 making her vulnerable to 3rd spacing. ALP down trending (349-->123), leucocytosis resolved (20k-->8.7k).Awaiting further resolution of her diarrhea, and SNF placement.  Subjective @TODAY @: Patient seen and examined this morning sitting up in bed.  Reports increased swelling in both upper extremities, left greater than right.  Asks when that will get better.  Discussed her low protein contributing to this.  Tolerating diet without nausea or vomiting.  Still remains watery with rectal tube in place.  Denies issues with biliary drain.  Denies fevers or chills, chest pain or shortness of breath, or other acute complaints.  Assessment & Plan:   Principal Problem:   Acalculous  cholecystitis Active Problems:   Uncontrolled type 2 diabetes mellitus with stage 3 chronic kidney disease (HCC)   Morbid obesity (HCC)   Hypothyroidism   Depression   CKD (chronic kidney disease), stage III (HCC)   Chronic diastolic heart failure (HCC)   Essential hypertension   AKI (acute kidney injury) ([place])   AMS (altered mental status)   Encephalopathy due to COVID-19 virus   Healing pressure injury, stage 2 (HCC)   Lower abdominal pain, unspecified   Leukocytosis   Clostridium difficile diarrhea   Anasarca   Severe C.diff colitis:Slowly improving. Followed by ID.Pt reports diarrhea improving, still watery, still with rectal tubein place. - ID following, appreciate recs:    - contfidoxomicin200 mg POBID through 10/26    - can use Immodium next 2-3 days to bulk up stool & remove rectal tube    - start PO Vanc taper after fidaxomicin as follows:    - Vanc taper: 125 mg TID x7 days, then 125 mg BID x14 days, then 125 mg daily until ID clinic follow up -Encourage mobility/frequent bed changes to avoid autoinoculation - Immodium 4 mg once given today (10/24)  Acute acalculous cholecystitis: S/p percutaneous cholecystectomy tube, IR, 10/17.Leukocytosis resolved. Tolerating regular diet but with nausea.Biliary culture showed rare E. Coli. ID recommends 5-6 days course post drain placement, given active C diff infection. -Completed 6-day course ofUnasyn  Recent COVID infection: Positive COVID test results 2 months ago and 3 weeks ago. Not on any treatment currently.  Asymptomatic.  Prolonged hospitalization with prolonged debility.  - Repeat Covid test 07/29/19 Negative  Anasarca/Ascites/acute on chronic diastolic CHF: Slightly improved.  Secondary to3rd spacing from severe hypoalbuminemia.Patient has significantly edematous left>right hand and b/l lower extremities, slightly improved today. -IV fluidsd/c'd -  cont home doselasix80 mg PO daily - monitor  volume status closely - fluid balance, cumulative: net -4.415 L to date - will give extra Lasix 80mg  PO this afternoon - elevate upper extremities  AcuteKidneyInjury -resolved: creatinine peaked to 2.0 in earlier hospital course likely due to dehydration, then normalized with IV fluids. It started to uptrend again to 1.7 -likely congested kidney this time in the setting of aggressive IV hydration.Improvedwith lasix. - BMP in AM to monitor  Hypothyroidism:Continuelevothyroxine  ?Orthostatic hypotension: patient on midodrine.SBP 110s.   Diabetes with neuropathy: on SSI/neurontin. On asa as ?preventive measure  Depression:ContinueWellbutrin, duloxetine  Normocytic Anemia : Chronic GI losses vs AOCD. Stable around 8--8.4. Ferritin 141, last fe level 78 and TIBC 292, folate 9.7 in Aug.No evidence of active blood loss.  Severe PCM :Albumin level too low at 1.0. Dietery evaluation.Unlikely agood candidate for TPN given ongoing infections. - dietary supplements added - encourage use  Stage II coccyx pressure injury, not POA; local wound care    DVT prophylaxis:Lovenox  Code Status:Code Status: DNR Family Communication:None at bedside Disposition Plan:Awaiting private room at SNF.  She is medically stable for discharge but still with rectal tube at this time.   Consultants:  Infectious disease  Interventional radiology  Procedures:  Percutaneous biliary drain by IR  Antimicrobials:  Unasyn, start 10/15, stop 10/22  Fidaxomicin, start 10/16, stop 10/26   Objective: Vitals:   07/29/19 2221 07/30/19 0500 07/30/19 0554 07/30/19 1630  BP: 127/79  (!) 116/53 108/63  Pulse: (!) 103  (!) 103 93  Resp: 18  18 14   Temp: (!) 97.5 F (36.4 C)  (!) 97.3 F (36.3 C) 98 F (36.7 C)  TempSrc: Oral  Oral Oral  SpO2: 93%  97% 93%  Weight:  127.1 kg    Height:        Intake/Output Summary (Last 24 hours) at 07/30/2019 1728 Last data filed  at 07/30/2019 1640 Gross per 24 hour  Intake 660 ml  Output 3125 ml  Net -2465 ml   Filed Weights   07/23/19 0500 07/29/19 0500 07/30/19 0500  Weight: 127 kg 127.2 kg 127.1 kg    Examination:  General exam: awake, alert, no acute distress, morbidly obese HEENT: clear conjunctiva, anicteric sclera, moist mucus membranes, hearing grossly normal  Respiratory system: Decreased breath sounds due to body habitus but clear bilaterally, no wheezes, rales or rhonchi, normal respiratory effort. Cardiovascular system: normal S1/S2, RRR, no JVD, murmurs, rubs, gallops, bilateral upper and lower extremity nonpitting edema  Gastrointestinal system: soft, non-tender, non-distended abdomen, biliary drain in place with dressing clean dry and intact Central nervous system: alert and oriented x4. no gross focal neurologic deficits, normal speech Extremities: moves all, normal tone Skin: dry, intact, normal temperature Psychiatry: normal mood, congruent affect, judgement and insight appear normal    Data Reviewed: I have personally reviewed following labs and imaging studies  CBC: Recent Labs  Lab 07/24/19 0853 07/25/19 0425 07/26/19 0440 07/27/19 0413 07/28/19 0440 07/29/19 0533  WBC 10.5 8.7 9.5 8.6 8.6 9.3  NEUTROABS 9.0*  --   --   --   --   --   HGB 8.0* 8.1* 8.4* 8.7* 8.6* 8.8*  HCT 26.8* 26.7* 29.4*  28.4* 28.7* 28.7* 30.8*  MCV 93.4 92.4 96.1 94.1 94.1 95.7  PLT 351 363 425* 419* 438* [id number]*   Basic Metabolic Panel: Recent Labs  Lab 07/26/19 0440 07/27/19 0413 07/28/19 0440 07/29/19 0533 07/30/19 0314  NA 140 139 140 139 138  K 3.5 3.4* 3.2* 3.8 3.8  CL 110 109 109 107 105  CO2 25 26 27 26 28   GLUCOSE 131* 149* 114* 114* 109*  BUN 15 16 15 16 15   CREATININE 1.13* 1.07* 0.95 0.94 0.96  CALCIUM 7.2* 7.1* 7.0* 7.1* 7.2*  MG  --   --  1.7 1.5* 1.8   GFR: Estimated Creatinine Clearance: 73.1 mL/min (by C-G formula based on SCr of 0.96 mg/dL). Liver Function Tests: Recent  Labs  Lab 07/25/19 0425 07/26/19 0440 07/27/19 0413 07/28/19 0440 07/29/19 0533  AST 17 15 15 18 19   ALT 12 10 10 9 11   ALKPHOS 128* 129* 136* 122 109  BILITOT 0.2* 0.4 0.3 0.5 0.2*  PROT 4.7* 4.9* 4.9* 4.9* 5.0*  ALBUMIN 1.0* 1.1* 1.1* 1.1* 1.2*   No results for input(s): LIPASE, AMYLASE in the last 168 hours. No results for input(s): AMMONIA in the last 168 hours. Coagulation Profile: No results for input(s): INR, PROTIME in the last 168 hours. Cardiac Enzymes: No results for input(s): CKTOTAL, CKMB, CKMBINDEX, TROPONINI in the last 168 hours. BNP (last 3 results) No results for input(s): PROBNP in the last 8760 hours. HbA1C: No results for input(s): HGBA1C in the last 72 hours. CBG: Recent Labs  Lab 07/29/19 1206 07/29/19 1747 07/30/19 0000 07/30/19 0547 07/30/19 1155  GLUCAP 93 141* 131* 117* 140*   Lipid Profile: No results for input(s): CHOL, HDL, LDLCALC, TRIG, CHOLHDL, LDLDIRECT in the last 72 hours. Thyroid Function Tests: No results for input(s): TSH, T4TOTAL, FREET4, T3FREE, THYROIDAB in the last 72 hours. Anemia Panel: No results for input(s): VITAMINB12, FOLATE, FERRITIN, TIBC, IRON, RETICCTPCT in the last 72 hours. Sepsis Labs: No results for input(s): PROCALCITON, LATICACIDVEN in the last 168 hours.  Recent Results (from the past 240 hour(s))  Aerobic/Anaerobic Culture (surgical/deep wound)     Status: None   Collection Time: 07/23/19  5:02 PM   Specimen: BILE  Result Value Ref Range Status   Specimen Description BILE  Final   Special Requests NONE  Final   Gram Stain NO WBC SEEN RARE GRAM NEGATIVE RODS   Final   Culture   Final    RARE ESCHERICHIA COLI NO ANAEROBES ISOLATED Performed at [facility], [address], [place], [place] [address]    Report Status 07/28/2019 FINAL  Final   Organism ID, Bacteria ESCHERICHIA COLI  Final      Susceptibility   Escherichia coli - MIC*    AMPICILLIN 4 SENSITIVE Sensitive     CEFAZOLIN <=4  SENSITIVE Sensitive     CEFEPIME <=1 SENSITIVE Sensitive     CEFTAZIDIME <=1 SENSITIVE Sensitive     CEFTRIAXONE <=1 SENSITIVE Sensitive     CIPROFLOXACIN <=0.25 SENSITIVE Sensitive     GENTAMICIN <=1 SENSITIVE Sensitive     IMIPENEM <=0.25 SENSITIVE Sensitive     TRIMETH/SULFA <=20 SENSITIVE Sensitive     AMPICILLIN/SULBACTAM <=2 SENSITIVE Sensitive     PIP/TAZO <=4 SENSITIVE Sensitive     Extended ESBL NEGATIVE Sensitive     * RARE ESCHERICHIA COLI  Culture, body fluid-bottle     Status: None (Preliminary result)   Collection Time: 07/23/19  5:55 PM   Specimen: Pleura  Result Value Ref Range Status   Specimen Description PLEURAL FLUID  Final   Special Requests   Final    BOTTLES DRAWN AEROBIC AND ANAEROBIC Blood Culture adequate volume   Culture   Final    NO GROWTH 4 DAYS Performed at [facility]  [facility], [place] [address], [place], [place] [address]    Report Status PENDING  Incomplete  Gram stain     Status: None   Collection Time: 07/23/19  5:55 PM   Specimen: Pleura  Result Value Ref Range Status   Specimen Description PLEURAL FLUID  Final   Special Requests NONE  Final   Gram Stain   Final    FEW WBC PRESENT, PREDOMINANTLY PMN NO ORGANISMS SEEN Performed at [facility], [place] [address] [place], [place] [address]    Report Status 07/24/2019 FINAL  Final  SARS CORONAVIRUS 2 (TAT 6-24 HRS) Nasopharyngeal Nasopharyngeal Swab     Status: None   Collection Time: 07/29/19  2:28 PM   Specimen: Nasopharyngeal Swab  Result Value Ref Range Status   SARS Coronavirus 2 NEGATIVE NEGATIVE Final    Comment: (NOTE) SARS-CoV-2 target nucleic acids are NOT DETECTED. The SARS-CoV-2 RNA is generally detectable in upper and lower respiratory specimens during the acute phase of infection. Negative results do not preclude SARS-CoV-2 infection, do not rule out co-infections with other pathogens, and should not be used as the sole basis for treatment or other patient management  decisions. Negative results must be combined with clinical observations, patient history, and epidemiological information. The expected result is Negative. Fact Sheet for Patients: SugarRoll.be Fact Sheet for Healthcare Providers: https://www.woods-mathews.com/ This test is not yet approved or cleared by the [place] FDA and  has been authorized for detection and/or diagnosis of SARS-CoV-2 by FDA under an Emergency Use Authorization (EUA). This EUA will remain  in effect (meaning this test can be used) for the duration of the COVID-19 declaration under Section 56 4(b)(1) of the Act, 21 U.S.C. section 360bbb-3(b)(1), unless the authorization is terminated or revoked sooner. Performed at [facility], [place] [address], [place], [place] [address]          Radiology Studies: No results found.      Scheduled Meds: . aspirin  81 mg Oral Daily  . buPROPion  100 mg Oral BID  . Chlorhexidine Gluconate Cloth  6 each Topical Daily  . DULoxetine  30 mg Oral BID  . enoxaparin (LOVENOX) injection  60 mg Subcutaneous Q24H  . feeding supplement (ENSURE ENLIVE)  237 mL Oral TID BM  . feeding supplement (PRO-STAT SUGAR FREE 64)  30 mL Oral TID WC  . fidaxomicin  200 mg Oral BID  . furosemide  80 mg Oral Daily  . gabapentin  300 mg Oral TID  . Gerhardt's butt cream  1 application Topical TID  . insulin aspart  0-15 Units Subcutaneous Q6H  . levothyroxine  88 mcg Oral QAC breakfast  . loperamide  4 mg Oral Once  . midodrine  10 mg Oral BID WC  . multivitamin with minerals  1 tablet Oral Daily  . rosuvastatin  5 mg Oral Daily  . sodium chloride flush  10-40 mL Intracatheter Q12H  . sodium chloride flush  5 mL Intracatheter Q8H  . sucralfate  1 g Oral TID WC & HS   Continuous Infusions:   LOS: 26 days    Time spent: 25-30 min    Ezekiel Slocumb, DO [facility] Pager: [phone]  If 7PM-7AM, please contact  night-coverage [email or web address] Password [facility] 07/30/2019, 5:28 PM

## 2019-07-30 NOTE — Progress Notes (Signed)
[facility] for Infectious Disease    Date of Admission:  07/04/2019   Total days of antibiotics 8 fidaxomicin  ID: Alice Kemp is a 68 y.o. female with   Principal Problem:   Acalculous cholecystitis Active Problems:   Uncontrolled type 2 diabetes mellitus with stage 3 chronic kidney disease (HCC)   Morbid obesity (HCC)   Hypothyroidism   Depression   CKD (chronic kidney disease), stage III (HCC)   Chronic diastolic heart failure (HCC)   Essential hypertension   AKI (acute kidney injury) ([place])   AMS (altered mental status)   Encephalopathy due to COVID-19 virus   Healing pressure injury, stage 2 (HCC)   Lower abdominal pain, unspecified   Leukocytosis   Clostridium difficile diarrhea   Anasarca    Subjective: Afebrile. Only mild nausea. Still requiring rectal tube  Medications:  . aspirin  81 mg Oral Daily  . buPROPion  100 mg Oral BID  . Chlorhexidine Gluconate Cloth  6 each Topical Daily  . DULoxetine  30 mg Oral BID  . enoxaparin (LOVENOX) injection  60 mg Subcutaneous Q24H  . feeding supplement (ENSURE ENLIVE)  237 mL Oral TID BM  . feeding supplement (PRO-STAT SUGAR FREE 64)  30 mL Oral TID WC  . fidaxomicin  200 mg Oral BID  . furosemide  80 mg Oral Daily  . gabapentin  300 mg Oral TID  . Gerhardt's butt cream  1 application Topical TID  . insulin aspart  0-15 Units Subcutaneous Q6H  . levothyroxine  88 mcg Oral QAC breakfast  . midodrine  10 mg Oral BID WC  . multivitamin with minerals  1 tablet Oral Daily  . rosuvastatin  5 mg Oral Daily  . sodium chloride flush  10-40 mL Intracatheter Q12H  . sodium chloride flush  5 mL Intracatheter Q8H  . sucralfate  1 g Oral TID WC & HS    Objective: Vital signs in last 24 hours: Temp:  [97.3 F (36.3 C)-97.8 F (36.6 C)] 97.3 F (36.3 C) (10/24 0554) Pulse Rate:  [95-103] 103 (10/24 0554) Resp:  [18-20] 18 (10/24 0554) BP: (116-127)/(53-79) 116/53 (10/24 0554) SpO2:  [93 %-97 %] 97 % (10/24 0554)  Weight:  [127.1 kg] 127.1 kg (10/24 0500) Physical Exam  Constitutional:  oriented to person, place, and time. appears well-developed and well-nourished. No distress.  HENT: [place]/AT, PERRLA, no scleral icterus Mouth/Throat: Oropharynx is clear and moist. No oropharyngeal exudate.  Cardiovascular: Normal rate, regular rhythm and normal heart sounds. Exam reveals no gallop and no friction rub.  No murmur heard.  Pulmonary/Chest: Effort normal and breath sounds normal. No respiratory distress.  has no wheezes.  Abdominal: Soft. Bowel sounds are normal.  exhibits no distension. There is no tenderness.  Ext: anasarca + pitting edema Neurological: alert and oriented to person, place, and time.  Skin: Skin is warm and dry. No rash noted. No erythema.  Psychiatric: a normal mood and affect.  behavior is normal.    Lab Results Recent Labs    07/28/19 0440 07/29/19 0533 07/30/19 0314  WBC 8.6 9.3  --   HGB 8.6* 8.8*  --   HCT 28.7* 30.8*  --   NA 140 139 138  K 3.2* 3.8 3.8  CL 109 107 105  CO2 27 26 28   BUN 15 16 15   CREATININE 0.95 0.94 0.96   Liver Panel Recent Labs    07/28/19 0440 07/29/19 0533  PROT 4.9* 5.0*  ALBUMIN 1.1* 1.2*  AST 18 19  ALT 9 11  ALKPHOS 122 109  BILITOT 0.5 0.2*    Microbiology: Reviewed. Pan sensitive ecoli from drain Studies/Results: No results found.   Assessment/Plan: Acute cholecystitis with source control = treated  Clostridium difficile = can give immodium for next 2-3d to see if can bulk up stool and no longer need rectal pouch. Has 2 more days of fidaxomicin. Would transition to vanco taper thereafer with oral vanco 125mg  tid x 7 days, then oral vanco bid x 14 days, then daily til seen back in the ID clinic  [facility] for Infectious Diseases Cell: [phone] Pager: [phone]  07/30/2019, 10:25 AM

## 2019-07-31 ENCOUNTER — Inpatient Hospital Stay (HOSPITAL_COMMUNITY): Payer: PPO

## 2019-07-31 DIAGNOSIS — R601 Generalized edema: Secondary | ICD-10-CM

## 2019-07-31 DIAGNOSIS — J690 Pneumonitis due to inhalation of food and vomit: Secondary | ICD-10-CM

## 2019-07-31 DIAGNOSIS — J9621 Acute and chronic respiratory failure with hypoxia: Secondary | ICD-10-CM

## 2019-07-31 DIAGNOSIS — R6520 Severe sepsis without septic shock: Secondary | ICD-10-CM

## 2019-07-31 DIAGNOSIS — R410 Disorientation, unspecified: Secondary | ICD-10-CM

## 2019-07-31 DIAGNOSIS — E8809 Other disorders of plasma-protein metabolism, not elsewhere classified: Secondary | ICD-10-CM

## 2019-07-31 DIAGNOSIS — N179 Acute kidney failure, unspecified: Secondary | ICD-10-CM | POA: Diagnosis not present

## 2019-07-31 DIAGNOSIS — A419 Sepsis, unspecified organism: Secondary | ICD-10-CM | POA: Diagnosis not present

## 2019-07-31 DIAGNOSIS — A0472 Enterocolitis due to Clostridium difficile, not specified as recurrent: Secondary | ICD-10-CM | POA: Diagnosis not present

## 2019-07-31 DIAGNOSIS — K819 Cholecystitis, unspecified: Secondary | ICD-10-CM | POA: Diagnosis not present

## 2019-07-31 DIAGNOSIS — G253 Myoclonus: Secondary | ICD-10-CM

## 2019-07-31 DIAGNOSIS — R21 Rash and other nonspecific skin eruption: Secondary | ICD-10-CM

## 2019-07-31 DIAGNOSIS — N1831 Chronic kidney disease, stage 3a: Secondary | ICD-10-CM

## 2019-07-31 DIAGNOSIS — F3289 Other specified depressive episodes: Secondary | ICD-10-CM

## 2019-07-31 LAB — COMPREHENSIVE METABOLIC PANEL
ALT: 11 U/L (ref 0–44)
AST: 18 U/L (ref 15–41)
Albumin: 1.1 g/dL — ABNORMAL LOW (ref 3.5–5.0)
Alkaline Phosphatase: 85 U/L (ref 38–126)
Anion gap: 8 (ref 5–15)
BUN: 15 mg/dL (ref 8–23)
CO2: 29 mmol/L (ref 22–32)
Calcium: 7.1 mg/dL — ABNORMAL LOW (ref 8.9–10.3)
Chloride: 101 mmol/L (ref 98–111)
Creatinine, Ser: 1.15 mg/dL — ABNORMAL HIGH (ref 0.44–1.00)
GFR calc Af Amer: 56 mL/min — ABNORMAL LOW (ref >60)
GFR calc non Af Amer: 49 mL/min — ABNORMAL LOW (ref >60)
Glucose, Bld: 161 mg/dL — ABNORMAL HIGH (ref 70–99)
Potassium: 4 mmol/L (ref 3.5–5.1)
Sodium: 138 mmol/L (ref 135–145)
Total Bilirubin: 0.5 mg/dL (ref 0.3–1.2)
Total Protein: 4.6 g/dL — ABNORMAL LOW (ref 6.5–8.1)

## 2019-07-31 LAB — LACTIC ACID, PLASMA
Lactic Acid, Venous: 1.1 mmol/L (ref 0.5–1.9)
Lactic Acid, Venous: 1.1 mmol/L (ref 0.5–1.9)
Lactic Acid, Venous: 1.4 mmol/L (ref 0.5–1.9)

## 2019-07-31 LAB — CBC
HCT: 29.3 % — ABNORMAL LOW (ref 36.0–46.0)
Hemoglobin: 8.7 g/dL — ABNORMAL LOW (ref 12.0–15.0)
MCH: 28.1 pg (ref 26.0–34.0)
MCHC: 29.7 g/dL — ABNORMAL LOW (ref 30.0–36.0)
MCV: 94.5 fL (ref 80.0–100.0)
Platelets: 495 10*3/uL — ABNORMAL HIGH (ref 150–400)
RBC: 3.1 MIL/uL — ABNORMAL LOW (ref 3.87–5.11)
RDW: 22.1 % — ABNORMAL HIGH (ref 11.5–15.5)
WBC: 11.5 10*3/uL — ABNORMAL HIGH (ref 4.0–10.5)
nRBC: 0 % (ref 0.0–0.2)

## 2019-07-31 LAB — CORTISOL: Cortisol, Plasma: 19.3 ug/dL

## 2019-07-31 LAB — CBC WITH DIFFERENTIAL/PLATELET
Abs Immature Granulocytes: 0.08 10*3/uL — ABNORMAL HIGH (ref 0.00–0.07)
Basophils Absolute: 0 10*3/uL (ref 0.0–0.1)
Basophils Relative: 0 %
Eosinophils Absolute: 0 10*3/uL (ref 0.0–0.5)
Eosinophils Relative: 0 %
HCT: 28.2 % — ABNORMAL LOW (ref 36.0–46.0)
Hemoglobin: 8.4 g/dL — ABNORMAL LOW (ref 12.0–15.0)
Immature Granulocytes: 1 %
Lymphocytes Relative: 29 %
Lymphs Abs: 4.5 10*3/uL — ABNORMAL HIGH (ref 0.7–4.0)
MCH: 27.5 pg (ref 26.0–34.0)
MCHC: 29.8 g/dL — ABNORMAL LOW (ref 30.0–36.0)
MCV: 92.5 fL (ref 80.0–100.0)
Monocytes Absolute: 0.9 10*3/uL (ref 0.1–1.0)
Monocytes Relative: 6 %
Neutro Abs: 9.8 10*3/uL — ABNORMAL HIGH (ref 1.7–7.7)
Neutrophils Relative %: 64 %
Platelets: 499 10*3/uL — ABNORMAL HIGH (ref 150–400)
RBC: 3.05 MIL/uL — ABNORMAL LOW (ref 3.87–5.11)
RDW: 22.1 % — ABNORMAL HIGH (ref 11.5–15.5)
WBC: 15.3 10*3/uL — ABNORMAL HIGH (ref 4.0–10.5)
nRBC: 0.1 % (ref 0.0–0.2)

## 2019-07-31 LAB — GLUCOSE, CAPILLARY
Glucose-Capillary: 130 mg/dL — ABNORMAL HIGH (ref 70–99)
Glucose-Capillary: 99 mg/dL (ref 70–99)
Glucose-Capillary: 157 mg/dL — ABNORMAL HIGH (ref 70–99)
Glucose-Capillary: 127 mg/dL — ABNORMAL HIGH (ref 70–99)

## 2019-07-31 LAB — BLOOD GAS, ARTERIAL
Acid-Base Excess: 8.5 mmol/L — ABNORMAL HIGH (ref 0.0–2.0)
Bicarbonate: 32.9 mmol/L — ABNORMAL HIGH (ref 20.0–28.0)
Drawn by: 246101
O2 Content: 4 L/min
O2 Saturation: 88.9 %
Patient temperature: 97.5
pCO2 arterial: 47.8 mmHg (ref 32.0–48.0)
pH, Arterial: 7.449 (ref 7.350–7.450)
pO2, Arterial: 57.1 mmHg — ABNORMAL LOW (ref 83.0–108.0)

## 2019-07-31 LAB — MAGNESIUM: Magnesium: 1.6 mg/dL — ABNORMAL LOW (ref 1.7–2.4)

## 2019-07-31 LAB — BASIC METABOLIC PANEL
Anion gap: 6 (ref 5–15)
BUN: 13 mg/dL (ref 8–23)
CO2: 31 mmol/L (ref 22–32)
Calcium: 7.2 mg/dL — ABNORMAL LOW (ref 8.9–10.3)
Chloride: 101 mmol/L (ref 98–111)
Creatinine, Ser: 0.9 mg/dL (ref 0.44–1.00)
GFR calc Af Amer: >60 mL/min (ref >60)
GFR calc non Af Amer: >60 mL/min (ref >60)
Glucose, Bld: 141 mg/dL — ABNORMAL HIGH (ref 70–99)
Potassium: 3.6 mmol/L (ref 3.5–5.1)
Sodium: 138 mmol/L (ref 135–145)

## 2019-07-31 LAB — PROTIME-INR
INR: 1.4 — ABNORMAL HIGH (ref 0.8–1.2)
Prothrombin Time: 17 seconds — ABNORMAL HIGH (ref 11.4–15.2)

## 2019-07-31 LAB — TYPE AND SCREEN
ABO/RH(D): A POS
Antibody Screen: NEGATIVE

## 2019-07-31 LAB — APTT: aPTT: 36 seconds (ref 24–36)

## 2019-07-31 LAB — PROCALCITONIN: Procalcitonin: 1.47 ng/mL

## 2019-07-31 MED ORDER — SODIUM CHLORIDE 0.9 % IV SOLN
3.0000 g | Freq: Four times a day (QID) | INTRAVENOUS | Status: DC
  Administered 2019-07-31: 3 g via INTRAVENOUS
  Filled 2019-07-31: qty 3
  Filled 2019-07-31 (×2): qty 8

## 2019-07-31 MED ORDER — POTASSIUM CHLORIDE CRYS ER 20 MEQ PO TBCR
40.0000 meq | EXTENDED_RELEASE_TABLET | Freq: Once | ORAL | Status: AC
  Administered 2019-07-31: 40 meq via ORAL
  Filled 2019-07-31: qty 2

## 2019-07-31 MED ORDER — IOHEXOL 350 MG/ML SOLN
100.0000 mL | Freq: Once | INTRAVENOUS | Status: AC | PRN
  Administered 2019-07-31: 14:00:00 100 mL via INTRAVENOUS

## 2019-07-31 MED ORDER — LACTATED RINGERS IV SOLN
INTRAVENOUS | Status: DC
Start: 2019-07-31 — End: 2019-08-05

## 2019-07-31 MED ORDER — SODIUM CHLORIDE 0.9 % IV BOLUS
250.0000 mL | Freq: Once | INTRAVENOUS | Status: AC
  Administered 2019-07-31: 18:00:00 250 mL via INTRAVENOUS

## 2019-07-31 MED ORDER — ALBUMIN HUMAN 25 % IV SOLN
25.0000 g | Freq: Four times a day (QID) | INTRAVENOUS | Status: AC
  Administered 2019-07-31 – 2019-08-01 (×4): 25 g via INTRAVENOUS
  Filled 2019-07-31: qty 50
  Filled 2019-07-31 (×4): qty 100
  Filled 2019-07-31: qty 50

## 2019-07-31 MED ORDER — MAGNESIUM SULFATE 2 GM/50ML IV SOLN
2.0000 g | Freq: Once | INTRAVENOUS | Status: AC
  Administered 2019-07-31: 2 g via INTRAVENOUS
  Filled 2019-07-31: qty 50

## 2019-07-31 MED ORDER — SODIUM CHLORIDE 0.9 % IV SOLN
200.0000 mg | Freq: Once | INTRAVENOUS | Status: AC
  Administered 2019-07-31: 200 mg via INTRAVENOUS
  Filled 2019-07-31: qty 200

## 2019-07-31 MED ORDER — SODIUM CHLORIDE 0.9 % IV SOLN
2.0000 g | Freq: Three times a day (TID) | INTRAVENOUS | Status: DC
  Administered 2019-07-31 – 2019-08-02 (×5): 2 g via INTRAVENOUS
  Filled 2019-07-31 (×7): qty 2

## 2019-07-31 MED ORDER — ALBUMIN HUMAN 25 % IV SOLN: 25.0000 g | Freq: Once | INTRAVENOUS | Status: DC

## 2019-07-31 MED ORDER — LOPERAMIDE HCL 2 MG PO CAPS: 4.0000 mg | ORAL_CAPSULE | ORAL | Status: DC | PRN

## 2019-07-31 MED ORDER — NYSTATIN 100000 UNIT/GM EX CREA
TOPICAL_CREAM | Freq: Two times a day (BID) | CUTANEOUS | Status: DC
  Administered 2019-08-01 – 2019-08-02 (×4): via TOPICAL
  Administered 2019-08-02: 1 via TOPICAL
  Administered 2019-08-03 – 2019-08-05 (×5): via TOPICAL
  Filled 2019-07-31 (×2): qty 15

## 2019-07-31 MED ORDER — ORAL CARE MOUTH RINSE
15.0000 mL | Freq: Two times a day (BID) | OROMUCOSAL | Status: DC
  Administered 2019-07-31 – 2019-08-03 (×4): 15 mL via OROMUCOSAL

## 2019-07-31 MED ORDER — SUCRALFATE 1 G PO TABS
1.0000 g | ORAL_TABLET | Freq: Three times a day (TID) | ORAL | Status: DC
  Administered 2019-08-01 – 2019-08-02 (×4): 1 g via ORAL
  Filled 2019-07-31 (×15): qty 1

## 2019-07-31 MED ORDER — SODIUM CHLORIDE 0.9 % IV SOLN
100.0000 mg | INTRAVENOUS | Status: DC
  Filled 2019-07-31 (×2): qty 100

## 2019-07-31 NOTE — Progress Notes (Signed)
At time of transfer to ICU asked pt if she would like [name] to call her mother and give her mother, Sharmon Revere, an update. Pt declined stating "she is already asleep, we can call in the morning."

## 2019-07-31 NOTE — Progress Notes (Signed)
Patient was short of breath, O2 sats dropped to 84%, 4L O2 placed and sats went up to 94% Heart rate 120s. New rash noted on front of neck. Dr. Arbutus Ped and rapid response notified of the above and came to the room. Patient had ABGs and CT scan ordered. Transferred to [facility]. Mother of patient notified of transfer

## 2019-07-31 NOTE — Consult Note (Signed)
NAME:  Alice Kemp, MRN:  [id number], DOB:  1951/08/29, LOS: 10 ADMISSION DATE:  07/04/2019, CONSULTATION DATE:  10/25  REFERRING MD:  Arbutus Ped , CHIEF COMPLAINT:  Hypotension, hypoxia  Brief History   68 yo F with complicated hospital course (COVId-19 survivor, ascites s/p para, cholecystitis, C Diff), who 10/25 has developed hypoxia and hypotension. Rapid response has seen patient multiple times 10/25. Patient on NRB and being fluid resuscitated. PCCm consulted   History of present illness   History obtained from chart review  68 yo F Obesit, diastolic heart failure, CKD III, DM HTN COVID-19 infection in June admitted from home 05-23-29 for spinal stenosis. Underwent lumbar laminectomy 8/24, discharged to SNF. Complicated by positive [id number] retest, development of cellulitis, abdominal wound infection. Patient presented 9/28 from [facility] with CC AMS diarrhea, falls. Found to be COVID positive, admitted to [facility]. Found to have Cdiff and assocaited dehydration. Treated with PO vanc. Developed acute cholecystitis and was then transferred from [facility] to [facility]. Underwent paracentesis and perc drainage 10/17 with IR. On Fidoxamin and flagyl per ID for CDiff. Patient's home diuretic was resumed for concern of fluid overload.   10/25 patient with new hypoxia and hypotension. After rapid response evaluations, PCCm to see.   Past Medical History  Diastolic CHF Obesity [id number] HTN CKD III DM  C. Diff Cholecystitis Ascites Malnutrition  Depression Hypothyroidism    Significant Hospital Events   9/28 admit to [facility] 2. CDiff positive 10/17 perc drainage for acute cholecystitis and paracentesis at [facility] 10/25. Hypoxia, on NRB. Hypotension, received IVF and albumin. Transferring to ICU   Consults:  IR ID PCCM  Procedures:  10/17 paracentesis, perc drain  Significant Diagnostic Tests:  10/25 CT chest> no PE. R>L pleural effusion. Bilateral ground glass appearance. LUL consolidation. Dinkins  pericardial effusion. Ascites.   10/25 CT neck soft tissue> mild SQ edema L anterior lower neck without abscess.   Micro Data:  10/23 SARS CoV2> neg  10/17 bile > rare e.coli  9/28 C.Diff> positve  9/28 COVID-19> positive   Antimicrobials:  Eraxis 10/25> Cefepime 10/25> Fidaxomicin 10/16>   Interim history/subjective:  On NRB Has been responsive to fluids  Drowsy   Objective   Blood pressure (!) 81/50, pulse (!) 108, temperature (!) 101.2 F (38.4 C), temperature source Axillary, resp. rate 18, height 5\' 4"  (1.626 m), weight 127.1 kg, SpO2 96 %.        Intake/Output Summary (Last 24 hours) at 07/31/2019 1918 Last data filed at 07/31/2019 1712 Gross per 24 hour  Intake 170 ml  Output 2300 ml  Net -2130 ml   Filed Weights   07/23/19 0500 07/29/19 0500 07/30/19 0500  Weight: 127 kg 127.2 kg 127.1 kg    Examination: General: Chronically ill elderly appearing adult F, supine in bed NAD HENT: NCAT. Poor dentition. NRD in place. Anicteric sclera. Trachea midline. Pink mmm. L anterior neck erythema  Lungs: Diminished lung sounds. Symmetrical chest expansion. Shallow respirations. No accessory muscle use  Cardiovascular: RRR s1s2 no rgm  Abdomen: Protuberant, soft, ndnt, hyperactive  Extremities: Anasarca. Pitting edema BUE BLE. No cyanosis. Rectal pouch Neuro: drowsy, awakens to voice. Oriented x2. Following commands GU: defer   Resolved Hospital Problem list     Assessment & Plan:   Acute encephalopathy -suspect toxic metabolic -associated hypotension, suspected sepsis, acute hypoxia -no focal deficits P -O2 support as below -Sepsis workup and support as below -ICU delirium precautions   Shock -suspect septic shock vs component of septic  and hypovolemic shocks in setting of C.Diff, poor nutrition status with anasarca/intravascular depletion, diuresis with net - fluid balance  P -albumin -continue IVF -broad abx -follow up culture data  -Patient is DNR --  no intubation, no CPR/CV if decompensates   Acute respiratory failure with hypoxia -CT negative for PE -CT chest with R>L pleural effusion. Suspect fluid shifts contribute to these to a degree. -COVID-19 survivor  -ground glass opacity bilateral lungs, greatest at LUL -multiple hospitalizations, abx courses -- at risk for HCAP  P -On NRB -O2 support for SpO2 > 92% -Do not think thora would be greatly beneficial at this time, unable to diurese in setting of shock. Can continue to eval thora need.  -abx to cover possible HCAP  -pulm hygiene -aspiration precautions   Acute Cholecystitis s/p perc drain 10/17 P -s/p course of unasyn   [place]s pericardial effusion P -close monitoring for tamponade physiology. If concerned for tamponade  HTN P -holding antihypertensives in setting of shock   Diastolic heart failure -home lasix P -holding further diuresis at this time in setting of shock   C. Difficile colitis P -followed by ID  -flagyl and fidoxamin per ID  -immodium   Anasarca Ascites  -paracentesis 10/17 37ml fluid removed P -abumin as above for volume resusc   CKD III P -trend renal indices, electrolytes  -If patient further decompensates, do not anticipate she would be candidate for renal replacement therapies but will engage nephrology if/when appropriate for evaluation  -On midodrine   Normocytic anemia P -Trend CBC -transfusion per unit protocol, no indication at present  DM P -SSI  Hypothyroidism P -Synthroid  Depression P -Hold wellbutrin, duloxetine while NPO  Protein calorie malnutrition, severe P -made NPO at present time in setting of acute decompensation. If improves will resume encouragement of dietary supplementation   Best practice:  Diet: NPO Pain/Anxiety/Delirium protocol (if indicated): na VAP protocol (if indicated): na DVT prophylaxis: lovenox GI prophylaxis: na Glucose control: SSI Mobility: BR Code Status: DNR  Family  Communication: pending Disposition: transfer to ICU   Labs   CBC: Recent Labs  Lab 07/26/19 0440 07/27/19 0413 07/28/19 0440 07/29/19 0533 07/31/19 0353  WBC 9.5 8.6 8.6 9.3 11.5*  HGB 8.4* 8.7* 8.6* 8.8* 8.7*  HCT 29.4*  28.4* 28.7* 28.7* 30.8* 29.3*  MCV 96.1 94.1 94.1 95.7 94.5  PLT 425* 419* 438* 512* 495*    Basic Metabolic Panel: Recent Labs  Lab 07/27/19 0413 07/28/19 0440 07/29/19 0533 07/30/19 0314 07/31/19 0353  NA 139 140 139 138 138  K 3.4* 3.2* 3.8 3.8 3.6  CL 109 109 107 105 101  CO2 26 27 26 28 31   GLUCOSE 149* 114* 114* 109* 141*  BUN 16 15 16 15 13   CREATININE 1.07* 0.95 0.94 0.96 0.90  CALCIUM 7.1* 7.0* 7.1* 7.2* 7.2*  MG  --  1.7 1.5* 1.8 1.6*   GFR: Estimated Creatinine Clearance: 78 mL/min (by C-G formula based on SCr of 0.9 mg/dL). Recent Labs  Lab 07/27/19 0413 07/28/19 0440 07/29/19 0533 07/31/19 0353 07/31/19 1600  WBC 8.6 8.6 9.3 11.5*  --   LATICACIDVEN  --   --   --   --  1.1    Liver Function Tests: Recent Labs  Lab 07/25/19 0425 07/26/19 0440 07/27/19 0413 07/28/19 0440 07/29/19 0533  AST 17 15 15 18 19   ALT 12 10 10 9 11   ALKPHOS 128* 129* 136* 122 109  BILITOT 0.2* 0.4 0.3 0.5 0.2*  PROT  4.7* 4.9* 4.9* 4.9* 5.0*  ALBUMIN 1.0* 1.1* 1.1* 1.1* 1.2*   No results for input(s): LIPASE, AMYLASE in the last 168 hours. No results for input(s): AMMONIA in the last 168 hours.  ABG    Component Value Date/Time   PHART 7.449 07/31/2019 1246   PCO2ART 47.8 07/31/2019 1246   PO2ART 57.1 (L) 07/31/2019 1246   HCO3 32.9 (H) 07/31/2019 1246   TCO2 27 01/28/2011 1502   ACIDBASEDEF 2.0 01/28/2011 1453   O2SAT 88.9 07/31/2019 1246     Coagulation Profile: No results for input(s): INR, PROTIME in the last 168 hours.  Cardiac Enzymes: No results for input(s): CKTOTAL, CKMB, CKMBINDEX, TROPONINI in the last 168 hours.  HbA1C: Hgb A1c MFr Bld  Date/Time Value Ref Range Status  05/24/2019 08:38 AM 8.9 (H) 4.8 - 5.6 %  Final    Comment:    (NOTE) Pre diabetes:          5.7%-6.4% Diabetes:              >6.4% Glycemic control for   <7.0% adults with diabetes   05/17/2018 11:28 AM 9.9 (H) 4.8 - 5.6 % Final    Comment:             Prediabetes: 5.7 - 6.4          Diabetes: >6.4          Glycemic control for adults with diabetes: <7.0     CBG: Recent Labs  Lab 07/30/19 1734 07/30/19 2311 07/31/19 0602 07/31/19 1117 07/31/19 1728  GLUCAP 158* 163* 130* 99 157*    Review of Systems:   Patient acutely encephalopathic and is unable to provide reliable ROS   Past Medical History  She,  has a past medical history of Anxiety, Arthritis, Back pain, Benign neoplasm of colon (06/29/2012), Breast abscess (07/2011), Cervical radiculopathy at C6, CKD (chronic kidney disease) stage 3, GFR 30-59 ml/min, Constipation, Depression, Diabetes mellitus type II, Diastolic heart failure ([place]) (09/2013 echo), Gall bladder inflammation, Glaucoma, Gout, Hyperlipidemia, Hypothyroidism, Morbid obesity ([place]), OBSTRUCTIVE SLEEP APNEA (12/23/2010), PERICARDIAL EFFUSION (12/12/2010), Pulmonary hypertension ([employer]), Vitamin B12 deficiency, and Vitamin D deficiency.   Surgical History    Past Surgical History:  Procedure Laterality Date  . BREAST BIOPSY  1990   Left  . CATARACT EXTRACTION    . COLONOSCOPY  06/29/2012   Procedure: COLONOSCOPY;  Surgeon: Ladene Artist, MD,FACG;  Location: [facility];  Service: Endoscopy;  Laterality: N/A;  . IR PARACENTESIS  07/23/2019  . IR PERC CHOLECYSTOSTOMY  07/23/2019  . LUMBAR LAMINECTOMY/DECOMPRESSION MICRODISCECTOMY Left 05/30/2019   Procedure: Left Decompressive Lumbar Laminectomy and Microdiscectomy lumbar three-four;  Surgeon: Kary Kos, MD;  Location: [facility];  Service: Neurosurgery;  Laterality: Left;  . SHOULDER SURGERY  2010   Left  . TONSILLECTOMY  1964     Social History   reports that she has never smoked. She has never used smokeless tobacco. She reports that she does not  drink alcohol or use drugs.   Family History   Her family history includes Breast cancer in her paternal aunt; Cancer in her father and paternal aunt; Colon cancer (age of onset: 12) in her paternal grandfather; Colon polyps in her father; Depression in her father; Diabetes in her father, maternal grandfather, and paternal uncle; Heart attack in her father and mother; Heart disease in her father and mother; Hyperlipidemia in her father; Hypertension in her brother and mother; Skin cancer in her father and paternal grandfather; Thyroid disease  in her mother. There is no history of Sudden death.   Allergies Allergies  Allergen Reactions  . Sulfa Antibiotics   . Morphine And Related Other (See Comments)    Behavioral Issues     Home Medications  Prior to Admission medications   Medication Sig Start Date End Date Taking? Authorizing Provider  allopurinol (ZYLOPRIM) 100 MG tablet TAKE 1 TABLET BY MOUTH ONCE DAILY Patient taking differently: Take 100 mg by mouth daily.  06/28/18  Yes Burns, Claudina Lick, MD  aspirin 81 MG tablet Take 81 mg by mouth daily. Reported on 12/24/2015   Yes [provider]  bisacodyl (DULCOLAX) 10 MG suppository Place 1 suppository (10 mg total) rectally daily as needed for moderate constipation. 06/05/19  Yes Sheikh, Omair Latif, DO  buPROPion (WELLBUTRIN SR) 200 MG 12 hr tablet Take 1 tablet (200 mg total) by mouth daily. 12/15/18  Yes Abby Potash, PA-C  DULoxetine (CYMBALTA) 30 MG capsule Take 1 capsule (30 mg total) by mouth 2 (two) times daily. 05/11/18  Yes Burns, Claudina Lick, MD  escitalopram (LEXAPRO) 10 MG tablet Take 10 mg by mouth daily.   Yes [provider]  ferrous sulfate 325 (65 FE) MG tablet Take 325 mg by mouth daily with breakfast. 06/07/19 07/19/19 Yes [provider]  fluticasone (CUTIVATE) 0.05 % cream APPLY 3 TIMES A DAY AS NEEDED FOR ITCHING Patient taking differently: Apply 1 application topically 3 (three) times daily as needed  (itching).  07/23/12  Yes Renato Shin, MD  gabapentin (NEURONTIN) 300 MG capsule Take 300 mg by mouth 2 (two) times daily.  05/05/16  Yes [provider]  insulin aspart (NOVOLOG) 100 UNIT/ML injection Inject 2-14 Units into the skin See admin instructions. Per Sliding Scale: If Blood Sugar is less than 70, Call MD. If Blood Sugar if 201-250=2u, 251-300=4u, 301-350=6u, 351-400=8u, 401-450=10u, 451-500=12u, if >500=14u AND call MD.   Yes [provider]  insulin glargine (LANTUS) 100 UNIT/ML injection Inject 15 Units into the skin at bedtime.   Yes [provider]  levothyroxine (SYNTHROID, LEVOTHROID) 88 MCG tablet Take 88 mcg by mouth daily before breakfast.   Yes [provider]  loperamide (IMODIUM A-D) 2 MG tablet Take 2-4 mg by mouth See admin instructions. Take 2 tablets to start, then 1 tab after each loose stool. NOT TO EXCEED 4 TABS PER DAY   Yes [provider]  Magnesium 200 MG TABS Take 200 mg by mouth daily.    Yes [provider]  pantoprazole (PROTONIX) 40 MG tablet Take 1 tablet (40 mg total) by mouth at bedtime. 06/05/19  Yes Sheikh, Omair Latif, DO  polyethylene glycol (MIRALAX / GLYCOLAX) 17 g packet Take 17 g by mouth daily as needed. Patient taking differently: Take 17 g by mouth daily as needed for mild constipation.  06/05/19  Yes Sheikh, Omair Latif, DO  rosuvastatin (CRESTOR) 5 MG tablet Take 1 tablet (5 mg total) by mouth daily. 09/24/17  Yes Beasley, Caren D, MD  saccharomyces boulardii (FLORASTOR) 250 MG capsule Take 250 mg by mouth 2 (two) times daily.   Yes [provider]  sodium chloride 0.9 % Inject 2,000 mLs into the vein See admin instructions. Infuse 1st liter @75CC /HR, then Infuse 2nd liter @40CC /HR every shift   Yes [provider]  vitamin B-12 (CYANOCOBALAMIN) 500 MCG tablet Take 500 mcg by mouth every Monday, Wednesday, and Friday.    Yes [provider]  benzonatate (TESSALON) 100  MG capsule Take 1  capsule (100 mg total) by mouth 3 (three) times daily as needed for cough. Patient not taking: Reported on 07/04/2019 04/01/19   Hoyt Koch, MD  celecoxib (CELEBREX) 100 MG capsule Take 1 capsule (100 mg total) by mouth daily as needed. Patient not taking: Reported on 07/04/2019 05/19/19   [name], MD  colchicine 0.6 MG tablet TAKE 2 TABLETS BY MOUTH AT ONSET OF FLARE THEN TAKE 1 TABLET 1 HOUR LATER Patient not taking: TAKE 2 TABLETS BY MOUTH AT ONSET OF FLARE THEN TAKE 1 TABLET 1 HOUR LATER 06/28/18   Burns, Claudina Lick, MD  furosemide (LASIX) 80 MG tablet Take 1 tablet (80 mg total) by mouth daily. Patient not taking: Reported on 07/04/2019 05/28/15   Rowe Clack, MD  losartan (COZAAR) 25 MG tablet Take 1 tablet (25 mg total) by mouth daily. -- Office visit needed for further refills Patient not taking: Reported on 07/04/2019 11/12/17   [name], MD  meclizine (ANTIVERT) 25 MG tablet Take 0.5-1 tablets (12.5-25 mg total) by mouth 3 (three) times daily as needed for dizziness. Patient not taking: Reported on 07/04/2019 01/05/19   [name], MD  methocarbamol (ROBAXIN) 500 MG tablet Take 1 tablet (500 mg total) by mouth every 8 (eight) hours as needed for muscle spasms. Patient not taking: Reported on 07/04/2019 06/05/19   Raiford Noble Latif, DO  ondansetron (ZOFRAN) 4 MG tablet Take 1 tablet (4 mg total) by mouth every 6 (six) hours as needed for nausea or vomiting. Patient not taking: Reported on 07/04/2019 06/05/19   Raiford Noble [place], DO  [facility] VERIO test strip Check BS twice daily 12/15/18   Abby Potash, PA-C  oxyCODONE 10 MG TABS Take 1 tablet (10 mg total) by mouth every 6 (six) hours as needed for severe pain. Patient not taking: Reported on 07/04/2019 06/05/19   Raiford Noble Latif, DO  senna-docusate (SENOKOT-S) 8.6-50 MG tablet Take 1 tablet by mouth 2 (two) times daily. Patient not taking: Reported on 07/04/2019 06/05/19   Raiford Noble Latif, DO   spironolactone (ALDACTONE) 25 MG tablet TAKE 1 TABLET (25 MG TOTAL) BY MOUTH DAILY. Patient not taking: Reported on 07/04/2019 06/28/18   [name], MD     Critical care time: 50 min        Eliseo Gum MSN, AGACNP-BC [place] [phone] If no answer, [phone] 07/31/2019, 7:18 PM

## 2019-07-31 NOTE — Progress Notes (Addendum)
PROGRESS NOTE    Alice Kemp  [id number] DOB: 01/01/1951 DOA: 07/04/2019  PCP: [name], MD    LOS - 27   Brief Narrative: 68 y.o.Fwith obesity,hypothyroidism, morbid obesity, dyslipidemia, DM, chronic diastolic heart failure, CKD stage III, COVID infection in [place] admitted from home on 8/18-8/30 for spinal stenosis. She underwent lumbar laminectomy and microdiscectomy on 8/24 and was discharged to SNF.This hospitalization was complicated by COVID positivity on restest, alsodeveloped abdominal wound infection/cellulitis.  Shepresentedthis time on 9/28 with AMS/diarrhea/ falls at [facility]. She was again COVID positive and admitted to [facility], ultimately found to have Cdiff with associated dehydration..After treatment at [facility] with oral vancomycin, she developed new acute cholecystitis by [facility]promptingtransfer [facility]. She has since been seen by GS and is s/p percutaneous drain, remains on IV unasyn. She is also on Fidoxamin and flagyl per ID for C.diff. Home lasix which was held initially has been resumed in concern for fluid overload. Creatinine was upto 1.7 before reinitiating Lasix and fortunately improving (1.3) since initiated on diuretics. Her albumin level was significantly low at 1.0 making her vulnerable to 3rd spacing. ALP down trending (349-->123), leucocytosis resolved (20k-->8.7k). Repeat Covid-19 negative on 10/23.Was pending d/c to SNF pending bed available until acute decompensation 10/25.  10/25: Rapid response called this AM for new onset hypoxia and tachycardia.  On 4 L/min o2, sats in upper 80's-low 90's.  HR in 120's.  Given her prolonged hospital course and immobility, highest concern for PE vs hypercarbia vs infection/aspiration vs less likely neurologic with no focal deficits (however brainstem CVA can sometimes present this way).  Exam was significant for new myclonic jerking of extremities, reduced alertness/sleepiness but no confusion and easily  arousable, fully oriented, new onset rash/erythematous patch on the lower anterior neck with induration.   ABG - 7.45/48/57/33.  CTA chest negative for PE, but did show moderate right and Renn left pleural effusions and associated atelectasis or consolidation; scattered, irregular ground-glass opacity of the bilateral lungs, most conspicuous in the left upper lobe and consistent with infection or inflammation; Kras pericardial effusion; ascites.  Also obtained CT soft tissue neck given the indurated new rash on her neck and concern for underlying process.  It showed mild subcutaneous edema left anterior lower neck without abscess, negative for mass or adenopathy in the neck, bilateral pleural effusions right greater than left, left upper lobe infiltrate (possible pneumonia or edema).  Patient started on Ventimask at 8 L/min and now satting 98%.  Transferred to step down unit.  Started Unasyn for suspected aspiration pneumonia.  Subsequently developed fevers up to 101.6 this afternoon as well as hypotension as low as 80/52.  Given to 250 cc bolus (patient extremely edematous secondary to hypoalbuminemia), Lasix DC'd.  Blood cultures ordered.  Lactate was normal at 1.1.  Pro-Cal pending, repeat CBC, CMP pending this evening.  Meets severe sepsis criteria given hypotension, fever, new hypoxia, altered mental status and evidence of respiratory infection.  Hypotension persistent after fluids, case discussed with PCCM and patient transferred to MICU around 7pm this evening.   Subjective @TODAY @: Rapid response this AM, as above.  Patient reports that she has started occasionally getting choked with swallowing.  Reports did start having a new cough yesterday afternoon or evening, and was feeling more short of breath this morning.  Denies feeling feverish or chilled.  Diarrhea still watery, despite Imodium yesterday.  Rectal tube still in place.  Denies abdominal pain, dysuria, nausea.  Denies irritation or tenderness  on her anterior neck with new rash.  Nurse on 6N did report patient vomited once earlier this morning shortly before rapid response.  Assessment & Plan:   Principal Problem:   Severe sepsis ([place]) Active Problems:   Aspiration pneumonia (HCC)   Clostridium difficile diarrhea   Hypoalbuminemia   Myoclonic jerking   Rash of neck   Uncontrolled type 2 diabetes mellitus with stage 3 chronic kidney disease (HCC)   Morbid obesity (HCC)   Hypothyroidism   Depression   CKD (chronic kidney disease), stage III (HCC)   Chronic diastolic heart failure (HCC)   Essential hypertension   AKI (acute kidney injury) ([place])   AMS (altered mental status)   Encephalopathy due to COVID-19 virus   Healing pressure injury, stage 2 (HCC)   Acalculous cholecystitis   Lower abdominal pain, unspecified   Leukocytosis   Anasarca   Severe sepsis due to aspiration pneumonia: New hypoxia, fever, hypotension, and altered mental status with evidence of multifocal pneumonia on CTA chest.  Mild leukocytosis 11.5 this morning from 9.3 yesterday.  Rapid response 10/25, as outlined above.  Lactate normal at 1.1.  ABG paO2 57, pCO2 48. -Started Unasyn -Procalcitonin, CMC, CMP, blood cultures pending -250 cc bolus given (severe hypoalbuminemia and diffuse edema/anasarca) -Lasix discontinued -Will order swallow eval -Updated ID who is following for C. Difficile  Acute Respiratory Failure with Hypoxia: new onset 10/25.   - on Ventimask at 8 L/min with sats 98+% - pO2 on ABG 57 - continue pulse ox & tirate O2 for sats > 92%  Myoclonic Jerking: new onset, suspect due to hypoxia.  Not hypercarbic or uremic.  Monitor.  New Rash, Anterior Neck: appears candida, but without irritation per patient.  Some induration noted so CT soft tissue neck obtained which showed some edema, no abscess.  Ordered nystatin cream and will monitor.  Severe C.diff colitis:Slowlyimproving. Followed by ID.Pt reports diarrhea improving, still  watery, still with rectal tubein place. - ID following, appreciate recs:    - contfidoxomicin200 mg POBID through 10/26    - can use Immodium next 2-3 days to bulk up stool & remove rectal tube    - start PO Vanc taper after fidaxomicin as follows:    - Vanc taper: 125 mg TID x7 days, then 125 mg BID x14 days, then 125 mg daily until ID clinic follow up -Encourage mobility/frequent bed changes to avoid autoinoculation - Immodium 4 mg once given today (10/24)  Acute acalculous cholecystitis: S/p percutaneous cholecystectomy tube, IR, 10/17.Leukocytosis resolved. Tolerating regular diet but with nausea.Biliary culture showed rare E. Coli.   -Completed 6-day course ofUnasyn  Recent COVID infection: Positive COVID test results 2 months ago and 3 weeks ago. Not on any treatment currently.  Asymptomatic.  Prolonged hospitalization with prolonged debility.  - Repeat Covid test 07/29/19 Negative  Anasarca/Ascites/acute on chronic diastolic CHF: Secondary 0000000 spacing from severe hypoalbuminemia.Patient has significantly edematous left>right hand and b/l lower extremities, slightly improved today. -IV fluidsd/c'd - d/c Lasix w/hypotension as above - monitor volume status closely - fluid balance, cumulative: net -6.4 L to date - elevate upper extremities - consider giving albumin  AcuteKidneyInjury -resolved: creatinine peaked to 2.0 in earlier hospital course likely due to dehydration, then normalized with IV fluids. It started to uptrend again to 1.7 -likely congested kidney this time in the setting of aggressive IV hydration.Improvedwith lasix. - BMP in AM to monitor  Hypothyroidism:Continuelevothyroxine  ?Orthostatic hypotension: patient on midodrine.SBP 110s.   Diabetes with neuropathy: on SSI/neurontin.  On asa as ?preventive measure  Depression:ContinueWellbutrin, duloxetine  Normocytic Anemia : Chronic GI losses vs AOCD. Stable around 8--8.4.  Ferritin 141, last fe level 78 and TIBC 292, folate 9.7 in Aug.No evidence of active blood loss.  Severe PCM :Albumin level too low at 1.0. Dietery evaluation.Unlikely agood candidate for TPN given ongoing infections. - dietary supplements added - encourage use  Stage II coccyx pressure injury, not POA; local wound care   DVT prophylaxis:Lovenox  Code Status:DNR Family Communication:Patient's mother updated by phone 7pm this evening, informed of acute decompensation, and transfer to ICU.  Disposition Plan:TBD, clinically decompensated today as above   Consultants:  Infectious disease  Interventional radiology  Procedures:  Percutaneous biliary drain by IR  Antimicrobials:  Unasyn, start 10/15, stop 10/22  Fidaxomicin, start 10/16, stop 10/26    Objective: Vitals:   07/31/19 1717 07/31/19 1722 07/31/19 1726 07/31/19 1730  BP:  (!) 86/57 (!) 80/52 (!) 93/48  Pulse:  (!) 114 (!) 114 (!) 114  Resp:  16 14 20   Temp: (!) 101.2 F (38.4 C) (!) 101.2 F (38.4 C)    TempSrc: Axillary Axillary    SpO2:  97% 99% 93%  Weight:      Height:        Intake/Output Summary (Last 24 hours) at 07/31/2019 1817 Last data filed at 07/31/2019 1712 Gross per 24 hour  Intake 170 ml  Output 2300 ml  Net -2130 ml   Filed Weights   07/23/19 0500 07/29/19 0500 07/30/19 0500  Weight: 127 kg 127.2 kg 127.1 kg    Examination:  General exam: awake, alert, no acute distress, morbidly obese, ill-appearing, less alert but easily arousable HEENT: new path of erythema with induration on anterior neck is non-tender non-fluctuant, clear conjunctiva, anicteric sclera, moist mucus membranes, hearing grossly normal  Respiratory system: decreased breath sounds at lateral bases, clear anteriorly, no wheezes, rales or rhonchi, on 4L/min O2 [place], no accessory muscle use. Cardiovascular system: normal S1/S2, RRR, no JVD, murmurs, rubs, gallops,  Diffuse edema. Gastrointestinal  system: biliary drain in place with clean/dry intact dressing and clear yellow nonpurulent fluid present, soft, non-tender, non-distended abdomen, no organomegaly or masses felt, normal bowel sounds.  Emacerated abdominal skin fold appears stable. Central nervous system: reduced alertness but oriented x4. no gross focal neurologic deficits, normal speech Extremities: moves all, diffuse edema in all extremities worse today Skin: dry, intact, cool to touch, new rash on neck as above Psychiatry: normal mood, congruent affect, judgement and insight appear normal   Data Reviewed: I have personally reviewed following labs and imaging studies  CBC: Recent Labs  Lab 07/26/19 0440 07/27/19 0413 07/28/19 0440 07/29/19 0533 07/31/19 0353  WBC 9.5 8.6 8.6 9.3 11.5*  HGB 8.4* 8.7* 8.6* 8.8* 8.7*  HCT 29.4*   28.4* 28.7* 28.7* 30.8* 29.3*  MCV 96.1 94.1 94.1 95.7 94.5  PLT 425* 419* 438* 512* [id number]*   Basic Metabolic Panel: Recent Labs  Lab 07/27/19 0413 07/28/19 0440 07/29/19 0533 07/30/19 0314 07/31/19 0353  NA 139 140 139 138 138  K 3.4* 3.2* 3.8 3.8 3.6  CL 109 109 107 105 101  CO2 26 27 26 28 31   GLUCOSE 149* 114* 114* 109* 141*  BUN 16 15 16 15 13   CREATININE 1.07* 0.95 0.94 0.96 0.90  CALCIUM 7.1* 7.0* 7.1* 7.2* 7.2*  MG  --  1.7 1.5* 1.8 1.6*   GFR: Estimated Creatinine Clearance: 78 mL/min (by C-G formula based on SCr of 0.9 mg/dL). Liver  Function Tests: Recent Labs  Lab 07/25/19 0425 07/26/19 0440 07/27/19 0413 07/28/19 0440 07/29/19 0533  AST 17 15 15 18 19   ALT 12 10 10 9 11   ALKPHOS 128* 129* 136* 122 109  BILITOT 0.2* 0.4 0.3 0.5 0.2*  PROT 4.7* 4.9* 4.9* 4.9* 5.0*  ALBUMIN 1.0* 1.1* 1.1* 1.1* 1.2*   No results for input(s): LIPASE, AMYLASE in the last 168 hours. No results for input(s): AMMONIA in the last 168 hours. Coagulation Profile: No results for input(s): INR, PROTIME in the last 168 hours. Cardiac Enzymes: No results for input(s): CKTOTAL, CKMB,  CKMBINDEX, TROPONINI in the last 168 hours. BNP (last 3 results) No results for input(s): PROBNP in the last 8760 hours. HbA1C: No results for input(s): HGBA1C in the last 72 hours. CBG: Recent Labs  Lab 07/30/19 1155 07/30/19 1734 07/30/19 2311 07/31/19 0602 07/31/19 1117  GLUCAP 140* 158* 163* 130* 99   Lipid Profile: No results for input(s): CHOL, HDL, LDLCALC, TRIG, CHOLHDL, LDLDIRECT in the last 72 hours. Thyroid Function Tests: No results for input(s): TSH, T4TOTAL, FREET4, T3FREE, THYROIDAB in the last 72 hours. Anemia Panel: No results for input(s): VITAMINB12, FOLATE, FERRITIN, TIBC, IRON, RETICCTPCT in the last 72 hours. Sepsis Labs: Recent Labs  Lab 07/31/19 1600  LATICACIDVEN 1.1    Recent Results (from the past 240 hour(s))  Aerobic/Anaerobic Culture (surgical/deep wound)     Status: None   Collection Time: 07/23/19  5:02 PM   Specimen: BILE  Result Value Ref Range Status   Specimen Description BILE  Final   Special Requests NONE  Final   Gram Stain NO WBC SEEN RARE GRAM NEGATIVE RODS   Final   Culture   Final    RARE ESCHERICHIA COLI NO ANAEROBES ISOLATED Performed at [facility], [place] [address] [place], [place] [address]    Report Status 07/28/2019 FINAL  Final   Organism ID, Bacteria ESCHERICHIA COLI  Final      Susceptibility   Escherichia coli - MIC*    AMPICILLIN 4 SENSITIVE Sensitive     CEFAZOLIN <=4 SENSITIVE Sensitive     CEFEPIME <=1 SENSITIVE Sensitive     CEFTAZIDIME <=1 SENSITIVE Sensitive     CEFTRIAXONE <=1 SENSITIVE Sensitive     CIPROFLOXACIN <=0.25 SENSITIVE Sensitive     GENTAMICIN <=1 SENSITIVE Sensitive     IMIPENEM <=0.25 SENSITIVE Sensitive     TRIMETH/SULFA <=20 SENSITIVE Sensitive     AMPICILLIN/SULBACTAM <=2 SENSITIVE Sensitive     PIP/TAZO <=4 SENSITIVE Sensitive     Extended ESBL NEGATIVE Sensitive     * RARE ESCHERICHIA COLI  Culture, body fluid-bottle     Status: None (Preliminary result)   Collection  Time: 07/23/19  5:55 PM   Specimen: Pleura  Result Value Ref Range Status   Specimen Description PLEURAL FLUID  Final   Special Requests   Final    BOTTLES DRAWN AEROBIC AND ANAEROBIC Blood Culture adequate volume   Culture   Final    NO GROWTH 4 DAYS Performed at [facility], [address], [place], [place] [address]    Report Status PENDING  Incomplete  Gram stain     Status: None   Collection Time: 07/23/19  5:55 PM   Specimen: Pleura  Result Value Ref Range Status   Specimen Description PLEURAL FLUID  Final   Special Requests NONE  Final   Gram Stain   Final    FEW WBC PRESENT, PREDOMINANTLY PMN NO ORGANISMS  SEEN Performed at [facility], [place] [address] [place], [place] [address]    Report Status 07/24/2019 FINAL  Final  SARS CORONAVIRUS 2 (TAT 6-24 HRS) Nasopharyngeal Nasopharyngeal Swab     Status: None   Collection Time: 07/29/19  2:28 PM   Specimen: Nasopharyngeal Swab  Result Value Ref Range Status   SARS Coronavirus 2 NEGATIVE NEGATIVE Final    Comment: (NOTE) SARS-CoV-2 target nucleic acids are NOT DETECTED. The SARS-CoV-2 RNA is generally detectable in upper and lower respiratory specimens during the acute phase of infection. Negative results do not preclude SARS-CoV-2 infection, do not rule out co-infections with other pathogens, and should not be used as the sole basis for treatment or other patient management decisions. Negative results must be combined with clinical observations, patient history, and epidemiological information. The expected result is Negative. Fact Sheet for Patients: SugarRoll.be Fact Sheet for Healthcare Providers: https://www.woods-mathews.com/ This test is not yet approved or cleared by the [place] FDA and  has been authorized for detection and/or diagnosis of SARS-CoV-2 by FDA under an Emergency Use Authorization (EUA). This EUA will remain  in effect (meaning this test  can be used) for the duration of the COVID-19 declaration under Section 56 4(b)(1) of the Act, 21 U.S.C. section 360bbb-3(b)(1), unless the authorization is terminated or revoked sooner. Performed at [facility], [place] [address], [place], [place] [address]          Radiology Studies: Ct Soft Tissue Neck W Contrast  Result Date: 07/31/2019 CLINICAL DATA:  Acute onset redness and induration left anterior neck. Rule out infection. EXAM: CT NECK WITH CONTRAST TECHNIQUE: Multidetector CT imaging of the neck was performed using the standard protocol following the bolus administration of intravenous contrast. CONTRAST:  142mL OMNIPAQUE IOHEXOL 350 MG/ML SOLN COMPARISON:  None. FINDINGS: Pharynx and larynx: Normal. No mass or swelling. Salivary glands: No inflammation, mass, or stone. Thyroid: Negative Lymph nodes: No enlarged lymph nodes in the neck. Vascular: Normal vascular enhancement Limited intracranial: Negative Visualized orbits: No orbital mass or edema. Bilateral cataract surgery Mastoids and visualized paranasal sinuses: Negative Skeleton: Cervical spondylosis.  No acute skeletal abnormality. Upper chest: Moderately large right pleural effusion and Leitzel left effusion. Mild infiltrate left upper lobe. Other: Mild subcutaneous edema left anterior lower chest. No significant skin thickening. No soft tissue abscess. IMPRESSION: 1. Mild subcutaneous edema left anterior lower neck without abscess. 2. Negative for mass or adenopathy in the neck 3. Bilateral pleural effusions right greater than left. Left upper lobe infiltrate. Possible pneumonia or edema. Electronically Signed   By: Franchot Gallo M.D.   On: 07/31/2019 14:27   Ct Angio Chest Pe W Or Wo Contrast  Result Date: 07/31/2019 CLINICAL DATA:  PE suspected, new hypoxia and tachycardia EXAM: CT ANGIOGRAPHY CHEST WITH CONTRAST TECHNIQUE: Multidetector CT imaging of the chest was performed using the standard protocol during bolus  administration of intravenous contrast. Multiplanar CT image reconstructions and MIPs were obtained to evaluate the vascular anatomy. CONTRAST:  12mL OMNIPAQUE IOHEXOL 350 MG/ML SOLN COMPARISON:  Chest radiograph, 07/21/2019, CT chest angiogram, 12/05/2010 FINDINGS: Cardiovascular: Satisfactory opacification of the pulmonary arteries to the segmental level. No evidence of pulmonary embolism. Normal heart size. Left coronary artery calcifications. Kimmet pericardial effusion. Aortic atherosclerosis. Mediastinum/Nodes: No enlarged mediastinal, hilar, or axillary lymph nodes. Thyroid gland, trachea, and esophagus demonstrate no significant findings. Lungs/Pleura: Moderate right, Starkman left pleural effusions and associated atelectasis or consolidation. There is scattered, irregular ground-glass opacity of the bilateral lungs,  most conspicuous in the left upper lobe. Upper Abdomen: Ascites in the included upper abdomen. Musculoskeletal: No chest wall abnormality. No acute or significant osseous findings. Review of the MIP images confirms the above findings. IMPRESSION: 1.  Negative examination for pulmonary embolism. 2. Moderate right, Eberly left pleural effusions and associated atelectasis or consolidation. 3. There is scattered, irregular ground-glass opacity of the bilateral lungs, most conspicuous in the left upper lobe and consistent with infection or inflammation. 4.  Castrejon pericardial effusion. 5.  Coronary artery disease.  Aortic Atherosclerosis (ICD10-I70.0). 6.  Ascites. Electronically Signed   By: Eddie Candle M.D.   On: 07/31/2019 14:42        Scheduled Meds:  aspirin  81 mg Oral Daily   buPROPion  100 mg Oral BID   Chlorhexidine Gluconate Cloth  6 each Topical Daily   DULoxetine  30 mg Oral BID   enoxaparin (LOVENOX) injection  60 mg Subcutaneous Q24H   feeding supplement (ENSURE ENLIVE)  237 mL Oral TID BM   feeding supplement (PRO-STAT SUGAR FREE 64)  30 mL Oral TID WC   fidaxomicin   200 mg Oral BID   gabapentin  300 mg Oral TID   Gerhardt's butt cream  1 application Topical TID   insulin aspart  0-15 Units Subcutaneous Q6H   levothyroxine  88 mcg Oral QAC breakfast   midodrine  10 mg Oral BID WC   multivitamin with minerals  1 tablet Oral Daily   nystatin cream   Topical BID   rosuvastatin  5 mg Oral Daily   sodium chloride flush  10-40 mL Intracatheter Q12H   sodium chloride flush  5 mL Intracatheter Q8H   sucralfate  1 g Oral TID WC & HS   Continuous Infusions:  ampicillin-sulbactam (UNASYN) IV 3 g (07/31/19 1708)     LOS: 27 days    Time spent: 120 min    Ezekiel Slocumb, DO [facility] Pager: [phone]  If 7PM-7AM, please contact night-coverage [email or web address] Password [facility] 07/31/2019, 6:17 PM

## 2019-07-31 NOTE — Significant Event (Addendum)
Rapid Response Event Note  Overview: Hypoxia  Initial Focused Assessment: Received a call from bedside nurse with concerns of patient having oxygen saturations in the mid 80s and now requiring oxygen. Per nurse, the patient had dropped her saturations and then vomited as well. I ordered an CXR and told the nurse that I was on my way. When I arrived, [facility] MD was at the bedside. The patient was drowsy - would respond quickly, but drifted asleep quickly and then would arouse quickly. She was not in distress, once awake, she was oriented. Saturations were 88-91% on 4L [place] and saturations improved to 94%. RR normal - normal effort, patient stated she had some shortness of breath earlier, but isn't short of breath now. + 2/+3 in all extremities, ongoing ascites/anasarca. HR 100-115 ST, stable BP, currently afebrile. No focal neuro deficits, has some new jerking movements per nurse and MD. Lung sounds - diminished throughout. + new redness/induration lower anterior aspect of her neck.   Interventions: -- STAT ABG - PaO2 57 - placed on VM 8L - saturations improved to 99%  -- CXR d/c - CT CHEST PE r/o ordered - negative for PE - moderate right, Sedlar left pleural effusions and associated atelectasis or consolidation. There is scattered, irregular ground-glass opacity of the bilateral lungs, most conspicuous in the left upper lobe and consistent with infection or inflammation. Mancebo pericardial effusion. -- CT Soft Tissue Neck - mild subcutaneous edema left anterior lower neck without abscess.  Plan of Care: -- Transfer to PCU - transferred to 2C03 by primary nurse and SWOT nurse.  -- Monitor VS  -- Wean oxygen -- Aspiration Precautions   Event Summary:  Start Time [place] 1207 End Time 1410  Inger Wiest R

## 2019-07-31 NOTE — Significant Event (Signed)
Rapid Response Team Event Note  Overview: Hypotension  I received a call from nursing staff with concerns of patient having hypotension. Per nurse, SBP in the 80s, MAP 60s, I asked the nursing staff to initiate a NS 250 cc bolus ( they had already given 500cc) and I asked that they page the doctor as well. I arrived as the MD did. Patient was alert, still oriented, had brief periods of mild confusion but could be easily re-oriented. SBP in the 90s when I arrived and MAP > 65. HR low 100s, 98% on 8L VM, RR normal - normal effort.   I paged the PCCM MD on request of TRH MD, PCCM MD called back and TRH MD spoke with him. PCCM MD to bedside. SBP > 100, MAP > 70  Plan: -- Albumin 25% - 25g x 1  -- Transfer to ICU when MICU bed becomes available.

## 2019-07-31 NOTE — Progress Notes (Signed)
Pt arrived to unit from 6N, She was alert, however confused. She asked about where her bed was, and where items were.  She would take her Veni-mask of, and indicate that she did not need it on, and de-Sat into the 70's.  Pt was Febrile, and given tylenol. During shift, Pt's became hypotensive and MD was notified, and 250 bolus was given. She responded to therapy for approximately 30 minutes, and then dropped back down systolic's into the [id number], MD notified, along as Rapid Response.   Orders received, working on orders, and Pt to be transferred to MICU. Report given to night nurse, she will continue with orders received until the Pt is transferred to MICU.

## 2019-08-01 DIAGNOSIS — A419 Sepsis, unspecified organism: Secondary | ICD-10-CM

## 2019-08-01 DIAGNOSIS — R6520 Severe sepsis without septic shock: Secondary | ICD-10-CM | POA: Diagnosis not present

## 2019-08-01 LAB — URINALYSIS, ROUTINE W REFLEX MICROSCOPIC
Bilirubin Urine: NEGATIVE
Glucose, UA: NEGATIVE mg/dL
Hgb urine dipstick: NEGATIVE
Ketones, ur: NEGATIVE mg/dL
Leukocytes,Ua: NEGATIVE
Nitrite: NEGATIVE
Protein, ur: NEGATIVE mg/dL
Specific Gravity, Urine: 1.04 — ABNORMAL HIGH (ref 1.005–1.030)
pH: 5 (ref 5.0–8.0)

## 2019-08-01 LAB — CBC
HCT: 25.5 % — ABNORMAL LOW (ref 36.0–46.0)
Hemoglobin: 7.6 g/dL — ABNORMAL LOW (ref 12.0–15.0)
MCH: 28.4 pg (ref 26.0–34.0)
MCHC: 29.8 g/dL — ABNORMAL LOW (ref 30.0–36.0)
MCV: 95.1 fL (ref 80.0–100.0)
Platelets: 470 10*3/uL — ABNORMAL HIGH (ref 150–400)
RBC: 2.68 MIL/uL — ABNORMAL LOW (ref 3.87–5.11)
RDW: 22.4 % — ABNORMAL HIGH (ref 11.5–15.5)
WBC: 13.3 10*3/uL — ABNORMAL HIGH (ref 4.0–10.5)
nRBC: 0 % (ref 0.0–0.2)

## 2019-08-01 LAB — PROCALCITONIN: Procalcitonin: 2.1 ng/mL

## 2019-08-01 LAB — GLUCOSE, CAPILLARY
Glucose-Capillary: 131 mg/dL — ABNORMAL HIGH (ref 70–99)
Glucose-Capillary: 128 mg/dL — ABNORMAL HIGH (ref 70–99)
Glucose-Capillary: 110 mg/dL — ABNORMAL HIGH (ref 70–99)
Glucose-Capillary: 141 mg/dL — ABNORMAL HIGH (ref 70–99)

## 2019-08-01 LAB — BASIC METABOLIC PANEL
Anion gap: 8 (ref 5–15)
BUN: 16 mg/dL (ref 8–23)
CO2: 29 mmol/L (ref 22–32)
Calcium: 7.5 mg/dL — ABNORMAL LOW (ref 8.9–10.3)
Chloride: 101 mmol/L (ref 98–111)
Creatinine, Ser: 1.21 mg/dL — ABNORMAL HIGH (ref 0.44–1.00)
GFR calc Af Amer: 53 mL/min — ABNORMAL LOW (ref >60)
GFR calc non Af Amer: 46 mL/min — ABNORMAL LOW (ref >60)
Glucose, Bld: 135 mg/dL — ABNORMAL HIGH (ref 70–99)
Potassium: 3.8 mmol/L (ref 3.5–5.1)
Sodium: 138 mmol/L (ref 135–145)

## 2019-08-01 LAB — COOXEMETRY PANEL
Carboxyhemoglobin: 1.7 % — ABNORMAL HIGH (ref 0.5–1.5)
Methemoglobin: 1.3 % (ref 0.0–1.5)
O2 Saturation: 77.1 %
Total hemoglobin: 11.3 g/dL — ABNORMAL LOW (ref 12.0–16.0)

## 2019-08-01 NOTE — Progress Notes (Signed)
Physical Therapy Treatment Patient Details Name: Alice Kemp MRN: [id number] DOB: Oct 02, 1951 Today's Date: 08/01/2019    History of Present Illness 68 y/o female pt w/ hx of anxiety, arthritis, back pain, benign neoplasm of colon, breast abscess, cervical radiculopathy, CKD III, depression, diastolic HF, DM II, glaucoma, gout, HLD, morbid obesity, obstructive sleep apnea, pericardial effusion, pulmonary HTN, hx of COVID in 06/20. pt underwent decompressive laminectomy L3 partial inferior laminectomy L2 and microscopic discectomy from the left and foraminotomies of the left L3 nerve root on 05/30/19. She had a fall on 07/02/2019, admitted with AMS, hypotension; C-diff.     PT Comments    Pt agreeable to having PT place bed in chair position to work on core strengthening and LE exercise. Pt reports apprehension with coming to EoB, and reports she feels much more secure with bed in chair position. Pt able to work on core strengthening and LE exercises, however is easily fatigued. Request pt remain in chair position for 30 minutes to improve tolerance of upright. Pt in agreement plan and given instructions in how to return to supine. D/c plans remain appropriate.     Follow Up Recommendations  SNF     Equipment Recommendations  None recommended by PT    Recommendations for Other Services       Precautions / Restrictions Precautions Precautions: Fall Precaution Comments: incontinence, low BP Restrictions Weight Bearing Restrictions: No    Mobility  Bed Mobility           Sit to supine: Max assist   General bed mobility comments: max A to assist pt to pull forward with bed in chair position   Transfers                 General transfer comment: unable to get to EOB  Ambulation/Gait             General Gait Details: unable to ambulate at this time          Balance Overall balance assessment: Needs assistance Sitting-balance support: Feet  supported;Bilateral upper extremity supported Sitting balance-Leahy Scale: Zero Sitting balance - Comments: unable to come to fully upright Postural control: Posterior lean                                  Cognition Arousal/Alertness: Awake/alert Behavior During Therapy: WFL for tasks assessed/performed Overall Cognitive Status: Impaired/Different from baseline Area of Impairment: Attention;Memory;Following commands;Safety/judgement;Awareness;Problem solving                 Orientation Level: Time;Situation Current Attention Level: Selective Memory: Decreased recall of precautions;Decreased short-term memory Following Commands: Follows one step commands with increased time;Follows one step commands consistently Safety/Judgement: Decreased awareness of safety;Decreased awareness of deficits Awareness: Anticipatory Problem Solving: Slow processing;Decreased initiation;Difficulty sequencing        Exercises General Exercises - Lower Extremity Ankle Circles/Pumps: AROM;Both;10 reps;Seated Long Arc Quad: AROM;Both;5 reps;Seated Hip ABduction/ADduction: AROM;Both;5 reps;Seated Hip Flexion/Marching: AROM;Both;5 reps;Seated Other Exercises Other Exercises: provided maxA to help pt pull herself into seating for 3x 20 sec    General Comments General comments (skin integrity, edema, etc.): Pt states she really needs to be able to walk, pt continues to be apprehensive about moving, educated pt on need to acclimitize to being upright, request she stay in chair position for at least 30 minutes before returning bed to supine      Pertinent Vitals/Pain Pain Assessment: Faces Faces Pain  Scale: Hurts a little bit Pain Location: generalized with movement  Pain Descriptors / Indicators: Discomfort;Grimacing Pain Intervention(s): Limited activity within patient's tolerance;Monitored during session;Repositioned           PT Goals (current goals can now be found in the  care plan section) Acute Rehab PT Goals Patient Stated Goal: to get back home to her mom and dogs PT Goal Formulation: With patient Time For Goal Achievement: 08/02/19 Potential to Achieve Goals: Fair    Frequency    Min 2X/week      PT Plan Current plan remains appropriate       AM-PAC PT "6 Clicks" Mobility   Outcome Measure  Help needed turning from your back to your side while in a flat bed without using bedrails?: A Lot Help needed moving from lying on your back to sitting on the side of a flat bed without using bedrails?: A Lot Help needed moving to and from a bed to a chair (including a wheelchair)?: Total Help needed standing up from a chair using your arms (e.g., wheelchair or bedside chair)?: Total Help needed to walk in hospital room?: Total Help needed climbing 3-5 steps with a railing? : Total 6 Click Score: 8    End of Session Equipment Utilized During Treatment: Oxygen Activity Tolerance: Patient tolerated treatment well Patient left: in bed;with call bell/phone within reach;with nursing/sitter in room Nurse Communication: Mobility status;Patient requests pain meds PT Visit Diagnosis: Other abnormalities of gait and mobility (R26.89);History of falling (Z91.81)     Time: [id number] PT Time Calculation (min) (ACUTE ONLY): 21 min  Charges:  $Therapeutic Exercise: 8-22 mins                     Ruthann Angulo B. Migdalia Dk PT, DPT Acute Rehabilitation Services Pager [phone] Office [phone]    Craig 08/01/2019, 1:44 PM

## 2019-08-01 NOTE — Progress Notes (Signed)
Spoke to Eliseo Gum, NP. Pt has not voided since arrival to ICU and bladder scan done multiple times but due to pt anatomy unable to get a good reading. Last documented void in chart was over 24 hours ago. When pt is asked if she feels like she needs to void pt says "no."  New order to insert foley.

## 2019-08-01 NOTE — Progress Notes (Signed)
NAME:  Alice Kemp, MRN:  [id number], DOB:  01/10/1951, LOS: 67 ADMISSION DATE:  07/04/2019, CONSULTATION DATE:  07/31/2019 REFERRING MD:  Dr Arbutus Ped, CHIEF COMPLAINT: Hypotension, hypoxia  Brief History   68 yo F with complicated hospital course (COVId-19 survivor, ascites s/p para, cholecystitis, C Diff), who 10/25 has developed hypoxia and hypotension. Rapid response has seen patient multiple times 10/25. Patient on NRB and being fluid resuscitated. PCCm consulted   History of present illness   History obtained from chart review  68 yo F Obesit, diastolic heart failure, CKD III, DM HTN COVID-19 infection in June admitted from home 05-23-29 for spinal stenosis. Underwent lumbar laminectomy 8/24, discharged to SNF. Complicated by positive [id number] retest, development of cellulitis, abdominal wound infection. Patient presented 9/28 from [facility] with CC AMS diarrhea, falls. Found to be COVID positive, admitted to [facility]. Found to have Cdiff and assocaited dehydration. Treated with PO vanc. Developed acute cholecystitis and was then transferred from [facility] to [facility]. Underwent paracentesis and perc drainage 10/17 with IR. On Fidoxamin and flagyl per ID for CDiff. Patient's home diuretic was resumed for concern of fluid overload.   10/25 patient with new hypoxia and hypotension. After rapid response evaluations, PCCm to see.   Past Medical History  Diastolic CHF Obesity [id number] HTN CKD III DM  C. Diff Cholecystitis Ascites Malnutrition  Depression Hypothyroidism    Significant Hospital Events   9/28 admit to [facility] CDiff positive 10/17 perc drainage for acute cholecystitis and paracentesis at [facility] 10/25. Hypoxia, on NRB. Hypotension, received IVF and albumin. Transferring to ICU  Consults:  IR ID PCCM  Procedures:  10/17 paracentesis, perc drain  Significant Diagnostic Tests:  10/25 CT chest> no PE. R>L pleural effusion. Bilateral ground glass appearance. LUL consolidation.  Schmieder pericardial effusion. Ascites.   10/25 CT neck soft tissue> mild SQ edema L anterior lower neck without abscess.   Micro Data:  10/23 SARS CoV2> neg  10/17 bile > rare e.coli  9/28 C.Diff> positve  9/28 COVID-19> positive    Antimicrobials:  Eraxis 10/25> Cefepime 10/25> Fidaxomicin 10/16>    Interim history/subjective:  On NRB Has been responsive to fluids  Drowsy   Objective   Blood pressure 99/75, pulse 99, temperature 97.9 F (36.6 C), temperature source Oral, resp. rate 10, height 5\' 4"  (1.626 m), weight 120.5 kg, SpO2 98 %. CVP:  [11 mmHg] 11 mmHg      Intake/Output Summary (Last 24 hours) at 08/01/2019 0943 Last data filed at 08/01/2019 0900 Gross per 24 hour  Intake 618.03 ml  Output 2600 ml  Net -1981.97 ml   Filed Weights   07/30/19 0500 08/01/19 0000 08/01/19 0246  Weight: 127.1 kg 120.5 kg 120.5 kg    Examination: General: Chronically ill elderly lady, morbidly obese HENT: Poor dentition, trachea midline the Lungs: Breath sounds are diminished Cardiovascular: S1-S2 appreciated Abdomen: Bowel sounds appreciated Extremities: Mild edema Neuro: Alert and oriented GU:   Resolved Hospital Problem list     Assessment & Plan:  Acute encephalopathy -Metabolic encephalopathy -Suspect from sepsis acute hypoxemia  -Oxygen supplementation -Follow sepsis work-up -Delirium precaution  Septic shock -C. difficile colitis -Continue broad-spectrum antibiotic -Continue albumin -Follow cultures -Started on Eraxis-macerated skin in the groin and under breasts -Blood culture negative-drawn 10/25  Fever -On cefepime -Eraxis -Obtain fungal blood cultures  C. difficile colitis -On Flagyl and for Fidoxamin per ID -Imodium  Acute respiratory failure with hypoxemia -Negative CT -Right pleural effusion -Continue oxygen supplementation to maintain saturations  greater than 90%  Chronic kidney disease stage III -Trend electrolytes -Avoid  nephrotoxic's -On midodrine for blood pressure support  Anasarca -Had paracentesis 10/17, 300 cc removed  Protein calorie malnutrition, severe  -Orally as tolerated   patient is DNR status    Best practice:  Diet: Diabetic diet, add Ensure Pain/Anxiety/Delirium protocol (if indicated): Neurontin, acetaminophen VAP protocol (if indicated): Not applicable DVT prophylaxis: Lovenox GI prophylaxis:  Glucose control: SSI Mobility: Bedrest Code Status: DNR Family Communication: Will update Disposition: ICU  The patient is critically ill with multiple organ systems failure and requires high complexity decision making for assessment and support, frequent evaluation and titration of therapies, application of advanced monitoring technologies and extensive interpretation of multiple databases. Critical Care Time devoted to patient care services described in this note independent of APP/resident time (if applicable)  is 35 minutes.   Sherrilyn Rist MD [place] [facility] Personal pager: [phone] If unanswered, please page CCM On-call: [phone]

## 2019-08-01 NOTE — TOC Progression Note (Signed)
Transition of Care [facility]) - Progression Note    Patient Details  Name: RICKELLE MCGIBBON MRN: [id number] Date of Birth: 1951/06/30  Transition of Care [facility]) CM/SW [place], [place] Phone Number: 08/01/2019, 10:50 AM  Clinical Narrative:    Acknowledging pt transfer to ICU, spoke with [employer] and withdrew authorization for SNF. CSW had requested GOC x2 with MD last week, will f/u with [facility] colleague and MD in regards to placing this referral in light of medical decline and continued care needs.   Expected Discharge Plan: Skilled Nursing Facility Barriers to Discharge: SNF Pending bed offer, Continued Medical Work up  Expected Discharge Plan and Services Expected Discharge Plan: [place] In-house Referral: Clinical Social Work Discharge Planning Services: NA Post Acute Care Choice: [place] Living arrangements for the past 2 months: [place] Expected Discharge Date: 07/07/19               DME Arranged: N/A DME Agency: NA HH Arranged: NA HH Agency: NA    Social Determinants of Health (SDOH) Interventions    Readmission Risk Interventions Readmission Risk Prevention Plan 07/05/2019  Transportation Screening Complete  PCP or Specialist Appt within 3-5 Days Complete  HRI or [place] Complete  Social Work Consult for [place] Planning/Counseling Complete  Palliative Care Screening Not Applicable  Medication Review [employer] photographer) Complete  PCP or Specialist appointment within 3-5 days of discharge Not Complete  PCP/Specialist Appt Not Complete comments plan for SNF  HRI or [place] Not Complete  HRI or Home Care Consult Pt Refusal Comments plan for SNF  SW Recovery Care/Counseling Consult Complete  Palliative Care Screening Not Applicable  Skilled Nursing Facility Complete  Some recent data might be hidden

## 2019-08-02 ENCOUNTER — Encounter (HOSPITAL_COMMUNITY): Payer: Self-pay | Admitting: Primary Care

## 2019-08-02 DIAGNOSIS — A419 Sepsis, unspecified organism: Secondary | ICD-10-CM | POA: Diagnosis not present

## 2019-08-02 DIAGNOSIS — Z7189 Other specified counseling: Secondary | ICD-10-CM | POA: Diagnosis not present

## 2019-08-02 DIAGNOSIS — R5383 Other fatigue: Secondary | ICD-10-CM

## 2019-08-02 DIAGNOSIS — R4182 Altered mental status, unspecified: Secondary | ICD-10-CM

## 2019-08-02 DIAGNOSIS — Z8719 Personal history of other diseases of the digestive system: Secondary | ICD-10-CM

## 2019-08-02 DIAGNOSIS — Z515 Encounter for palliative care: Secondary | ICD-10-CM | POA: Diagnosis not present

## 2019-08-02 LAB — CBC
HCT: 24.7 % — ABNORMAL LOW (ref 36.0–46.0)
Hemoglobin: 7.3 g/dL — ABNORMAL LOW (ref 12.0–15.0)
MCH: 28.6 pg (ref 26.0–34.0)
MCHC: 29.6 g/dL — ABNORMAL LOW (ref 30.0–36.0)
MCV: 96.9 fL (ref 80.0–100.0)
Platelets: 411 10*3/uL — ABNORMAL HIGH (ref 150–400)
RBC: 2.55 MIL/uL — ABNORMAL LOW (ref 3.87–5.11)
RDW: 22.2 % — ABNORMAL HIGH (ref 11.5–15.5)
WBC: 13.9 10*3/uL — ABNORMAL HIGH (ref 4.0–10.5)
nRBC: 0 % (ref 0.0–0.2)

## 2019-08-02 LAB — BASIC METABOLIC PANEL
Anion gap: 7 (ref 5–15)
BUN: 14 mg/dL (ref 8–23)
CO2: 30 mmol/L (ref 22–32)
Calcium: 7.6 mg/dL — ABNORMAL LOW (ref 8.9–10.3)
Chloride: 101 mmol/L (ref 98–111)
Creatinine, Ser: 1.1 mg/dL — ABNORMAL HIGH (ref 0.44–1.00)
GFR calc Af Amer: 59 mL/min — ABNORMAL LOW (ref >60)
GFR calc non Af Amer: 51 mL/min — ABNORMAL LOW (ref >60)
Glucose, Bld: 120 mg/dL — ABNORMAL HIGH (ref 70–99)
Potassium: 3.2 mmol/L — ABNORMAL LOW (ref 3.5–5.1)
Sodium: 138 mmol/L (ref 135–145)

## 2019-08-02 LAB — PROCALCITONIN: Procalcitonin: 1.47 ng/mL

## 2019-08-02 LAB — GLUCOSE, CAPILLARY
Glucose-Capillary: 130 mg/dL — ABNORMAL HIGH (ref 70–99)
Glucose-Capillary: 108 mg/dL — ABNORMAL HIGH (ref 70–99)
Glucose-Capillary: 95 mg/dL (ref 70–99)
Glucose-Capillary: 124 mg/dL — ABNORMAL HIGH (ref 70–99)

## 2019-08-02 MED ORDER — POTASSIUM CHLORIDE 10 MEQ/50ML IV SOLN
10.0000 meq | INTRAVENOUS | Status: AC
  Administered 2019-08-02 (×4): 10 meq via INTRAVENOUS
  Filled 2019-08-02 (×3): qty 50

## 2019-08-02 NOTE — Progress Notes (Signed)
[facility] ADULT ICU REPLACEMENT PROTOCOL FOR AM LAB REPLACEMENT ONLY  The patient does apply for the [facility] Adult ICU Electrolyte Replacment Protocol based on the criteria listed below:   1. Is GFR >/= 40 ml/min? Yes.    Patient's GFR today is 51 2. Is urine output >/= 0.5 ml/kg/hr for the last 6 hours? Yes.   Patient's UOP is 0.73 ml/kg/hr 3. Is BUN < 60 mg/dL? Yes.    Patient's BUN today is 14 4. Abnormal electrolyte  K 3.2 5. Ordered repletion with: protocol 6. If a panic level lab has been reported, has the CCM MD in charge been notified? Yes.  .   Physician:  Illene Labrador, [place] Brim 08/02/2019 6:21 AM

## 2019-08-02 NOTE — Consult Note (Signed)
Consultation Note Date: 08/02/2019   Patient Name: Alice Kemp  DOB: 09/06/1951  MRN: [id number]  Age / Sex: 68 y.o., female  PCP: [name], MD Referring Physician: Kipp Brood, MD  Reason for Consultation: Establishing goals of care and Psychosocial/spiritual support  HPI/Patient Profile: 68 y.o. female  with past medical history of admit from [facility] health where she was sent last month after a lumbar decompression surgery, history of anxiety and depression, obesity, arthritis, stage III kidney disease, diabetes 2, dHF, OSA, high blood pressure and cholesterol, admitted on 07/04/2019 with COVID-19 with encephalopathy, falls, C. difficile and associated dehydration, treated with p.o. bank, developed acute cholecystitis with purulent drainage 10/17 with IR, acute kidney injury.   Clinical Assessment and Goals of Care: Alice Kemp, Alice Kemp, is lying quietly in bed.  She will only briefly open her eyes, but not make eye contact.  She is oriented to self only at this time.  She declines food or drink, but is willing to take a few medicines at this time.  There is no family at bedside at this time.  We talked about healthcare power of attorney, see below.  Call to mother, Eyde Chrystal at [phone],  Left voicemail.  Call to mobile number at [phone], no answer, no VM, unable to leave VM.   Albumin 1.1 noted.    Conference with bedside nursing staff and CCM attending.  HCPOA   NEXT OF [name] tells me that she is not married, does not have children.  She tells me that her mother, Yohanna Roome [id number]), makes healthcare choices if she cannot.  She tells me that she does have siblings.   SUMMARY OF RECOMMENDATIONS   At this point continue to treat the treatable but no CPR no intubation. Continue to reach out to healthcare power of attorney/mother, Eyde Rupnow for goals of care discussions. [name] would  clearly qualify for residential hospice.  Code Status/Advance Care Planning:  DNR  Symptom Management:   Per hospitalist, no additional needs at this time.  Palliative Prophylaxis:   Palliative Wound Care and Turn Reposition  Additional Recommendations (Limitations, Scope, Preferences):  Continue to treat this treatable but no CPR, no intubation  Psycho-social/Spiritual:   Desire for further Chaplaincy support:no  Additional Recommendations: Caregiving  Support/Resources and Education on Hospice  Prognosis:   Unable to determine, based on outcomes.  At best likely less than 1 year, at worst possible weeks.  Discharge Planning: To be determined, based on outcomes.      Primary Diagnoses: Present on Admission: . AMS (altered mental status) . Encephalopathy due to COVID-19 virus . CKD (chronic kidney disease), stage III ([place]) . AKI (acute kidney injury) ([place]) . Chronic diastolic heart failure ([place]) . Depression . Essential hypertension . Hypothyroidism . Morbid obesity ([place]) . Uncontrolled type 2 diabetes mellitus with stage 3 chronic kidney disease ([place])   I have reviewed the medical record, interviewed the patient and family, and examined the patient. The following aspects are pertinent.  Past Medical History:  Diagnosis Date  . Anxiety   . Arthritis   . Back pain   . Benign neoplasm of colon 06/29/2012  . Breast abscess 07/2011   left; s/p I&D + abx by gen surg  . Cervical radiculopathy at C6   . CKD (chronic kidney disease) stage 3, GFR 30-59 ml/min    follows with renal  . Constipation   . Depression   . Diabetes mellitus type II   . Diastolic heart failure ([name]) 09/2013 echo  . Gall bladder inflammation   . Glaucoma   . Gout   . Hyperlipidemia   . Hypothyroidism   . Morbid obesity ([place])   . OBSTRUCTIVE SLEEP APNEA 12/23/2010   npsg 2012:  AHI 25/hr, severe desat to 68%.   autoset 2013: optimal pressure 13cm   . PERICARDIAL EFFUSION 12/12/2010  .  Pulmonary hypertension ([place])   . Vitamin B12 deficiency   . Vitamin D deficiency    Social History   Socioeconomic History  . Marital status: Single    Spouse name: Not on file  . Number of children: 0  . Years of education: Not on file  . Highest education level: Not on file  Occupational History  . 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 Financial resource strain: Somewhat hard  . Food insecurity    Worry: Never true    Inability: Never true  . Transportation needs    Medical: No    Non-medical: No  Tobacco Use  . Smoking status: Never Smoker  . Smokeless tobacco: Never Used  Substance and Sexual Activity  . Alcohol use: No    Alcohol/week: 0.0 standard drinks    Frequency: Never  . Drug use: No  . Sexual activity: Not on file  Lifestyle  . Physical activity    Days per week: 0 days    Minutes per session: 0 min  . Stress: Only a little  Relationships  . Social Herbalist on phone: Patient refused    Gets together: Patient refused    Attends religious service: Patient refused    Active member of club or organization: Patient refused    Attends meetings of clubs or organizations: Patient refused    Relationship status: Patient refused  Other Topics Concern  . Not on file  Social History Narrative   Lives alone.      Family History  Problem Relation Age of Onset  . Hypertension Mother   . Heart disease Mother   . Heart attack Mother   . Thyroid disease Mother   . Diabetes Father   . Heart disease Father   . Skin cancer Father   . Cancer Father        skin  . Heart attack Father   . Hyperlipidemia Father   . Colon polyps Father   . Depression Father   . Hypertension Brother   . Skin cancer Paternal Grandfather   . Colon cancer Paternal Grandfather 46  . Breast cancer Paternal Aunt   . Cancer Paternal Aunt        breast  . Diabetes Paternal Uncle   .  Diabetes Maternal Grandfather   . Sudden death Neg Hx    Scheduled Meds: . aspirin  81 mg Oral Daily  . buPROPion  100 mg Oral BID  . Chlorhexidine Gluconate Cloth  6 each Topical Daily  .  DULoxetine  30 mg Oral BID  . enoxaparin (LOVENOX) injection  60 mg Subcutaneous Q24H  . feeding supplement (ENSURE ENLIVE)  237 mL Oral TID BM  . feeding supplement (PRO-STAT SUGAR FREE 64)  30 mL Oral TID WC  . fidaxomicin  200 mg Oral BID  . gabapentin  300 mg Oral TID  . Gerhardt's butt cream  1 application Topical TID  . insulin aspart  0-15 Units Subcutaneous Q6H  . levothyroxine  88 mcg Oral QAC breakfast  . mouth rinse  15 mL Mouth Rinse BID  . midodrine  10 mg Oral BID WC  . multivitamin with minerals  1 tablet Oral Daily  . nystatin cream   Topical BID  . rosuvastatin  5 mg Oral Daily  . sodium chloride flush  10-40 mL Intracatheter Q12H  . sodium chloride flush  5 mL Intracatheter Q8H  . sucralfate  1 g Oral TID WC & HS   Continuous Infusions: . lactated ringers     PRN Meds:.acetaminophen, alum & mag hydroxide-simeth, loperamide, magic mouthwash w/lidocaine, [DISCONTINUED] ondansetron **OR** ondansetron (ZOFRAN) IV, phenol, promethazine Medications Prior to Admission:  Prior to Admission medications   Medication Sig Start Date End Date Taking? Authorizing Provider  allopurinol (ZYLOPRIM) 100 MG tablet TAKE 1 TABLET BY MOUTH ONCE DAILY Patient taking differently: Take 100 mg by mouth daily.  06/28/18  Yes Burns, Claudina Lick, MD  aspirin 81 MG tablet Take 81 mg by mouth daily. Reported on 12/24/2015   Yes [provider]  bisacodyl (DULCOLAX) 10 MG suppository Place 1 suppository (10 mg total) rectally daily as needed for moderate constipation. 06/05/19  Yes Sheikh, Omair Latif, DO  buPROPion (WELLBUTRIN SR) 200 MG 12 hr tablet Take 1 tablet (200 mg total) by mouth daily. 12/15/18  Yes Abby Potash, PA-C  DULoxetine (CYMBALTA) 30 MG capsule Take 1 capsule (30 mg total) by mouth 2  (two) times daily. 05/11/18  Yes Burns, Claudina Lick, MD  escitalopram (LEXAPRO) 10 MG tablet Take 10 mg by mouth daily.   Yes [provider]  ferrous sulfate 325 (65 FE) MG tablet Take 325 mg by mouth daily with breakfast. 06/07/19 07/19/19 Yes [provider]  fluticasone (CUTIVATE) 0.05 % cream APPLY 3 TIMES A DAY AS NEEDED FOR ITCHING Patient taking differently: Apply 1 application topically 3 (three) times daily as needed (itching).  07/23/12  Yes Renato Shin, MD  gabapentin (NEURONTIN) 300 MG capsule Take 300 mg by mouth 2 (two) times daily.  05/05/16  Yes [provider]  insulin aspart (NOVOLOG) 100 UNIT/ML injection Inject 2-14 Units into the skin See admin instructions. Per Sliding Scale: If Blood Sugar is less than 70, Call MD. If Blood Sugar if 201-250=2u, 251-300=4u, 301-350=6u, 351-400=8u, 401-450=10u, 451-500=12u, if >500=14u AND call MD.   Yes [provider]  insulin glargine (LANTUS) 100 UNIT/ML injection Inject 15 Units into the skin at bedtime.   Yes [provider]  levothyroxine (SYNTHROID, LEVOTHROID) 88 MCG tablet Take 88 mcg by mouth daily before breakfast.   Yes [provider]  loperamide (IMODIUM A-D) 2 MG tablet Take 2-4 mg by mouth See admin instructions. Take 2 tablets to start, then 1 tab after each loose stool. NOT TO EXCEED 4 TABS PER DAY   Yes [provider]  Magnesium 200 MG TABS Take 200 mg by mouth daily.    Yes [provider]  pantoprazole (PROTONIX) 40 MG tablet Take 1 tablet (40 mg total) by mouth at bedtime.  06/05/19  Yes Sheikh, Omair Latif, DO  polyethylene glycol (MIRALAX / GLYCOLAX) 17 g packet Take 17 g by mouth daily as needed. Patient taking differently: Take 17 g by mouth daily as needed for mild constipation.  06/05/19  Yes Sheikh, Omair Latif, DO  rosuvastatin (CRESTOR) 5 MG tablet Take 1 tablet (5 mg total) by mouth daily. 09/24/17  Yes Beasley, Caren D, MD  saccharomyces boulardii  (FLORASTOR) 250 MG capsule Take 250 mg by mouth 2 (two) times daily.   Yes [provider]  sodium chloride 0.9 % Inject 2,000 mLs into the vein See admin instructions. Infuse 1st liter @75CC /HR, then Infuse 2nd liter @40CC /HR every shift   Yes [provider]  vitamin B-12 (CYANOCOBALAMIN) 500 MCG tablet Take 500 mcg by mouth every Monday, Wednesday, and Friday.    Yes [provider]  benzonatate (TESSALON) 100 MG capsule Take 1 capsule (100 mg total) by mouth 3 (three) times daily as needed for cough. Patient not taking: Reported on 07/04/2019 04/01/19   Hoyt Koch, MD  celecoxib (CELEBREX) 100 MG capsule Take 1 capsule (100 mg total) by mouth daily as needed. Patient not taking: Reported on 07/04/2019 05/19/19   [name], MD  colchicine 0.6 MG tablet TAKE 2 TABLETS BY MOUTH AT ONSET OF FLARE THEN TAKE 1 TABLET 1 HOUR LATER Patient not taking: TAKE 2 TABLETS BY MOUTH AT ONSET OF FLARE THEN TAKE 1 TABLET 1 HOUR LATER 06/28/18   Burns, Claudina Lick, MD  furosemide (LASIX) 80 MG tablet Take 1 tablet (80 mg total) by mouth daily. Patient not taking: Reported on 07/04/2019 05/28/15   Rowe Clack, MD  losartan (COZAAR) 25 MG tablet Take 1 tablet (25 mg total) by mouth daily. -- Office visit needed for further refills Patient not taking: Reported on 07/04/2019 11/12/17   [name], MD  meclizine (ANTIVERT) 25 MG tablet Take 0.5-1 tablets (12.5-25 mg total) by mouth 3 (three) times daily as needed for dizziness. Patient not taking: Reported on 07/04/2019 01/05/19   [name], MD  methocarbamol (ROBAXIN) 500 MG tablet Take 1 tablet (500 mg total) by mouth every 8 (eight) hours as needed for muscle spasms. Patient not taking: Reported on 07/04/2019 06/05/19   Raiford Noble Latif, DO  ondansetron (ZOFRAN) 4 MG tablet Take 1 tablet (4 mg total) by mouth every 6 (six) hours as needed for nausea or vomiting. Patient not taking: Reported on 07/04/2019 06/05/19   Raiford Noble [place], DO  [facility] VERIO test strip Check BS twice daily 12/15/18   Abby Potash, PA-C  oxyCODONE 10 MG TABS Take 1 tablet (10 mg total) by mouth every 6 (six) hours as needed for severe pain. Patient not taking: Reported on 07/04/2019 06/05/19   Raiford Noble Latif, DO  senna-docusate (SENOKOT-S) 8.6-50 MG tablet Take 1 tablet by mouth 2 (two) times daily. Patient not taking: Reported on 07/04/2019 06/05/19   Raiford Noble Latif, DO  spironolactone (ALDACTONE) 25 MG tablet TAKE 1 TABLET (25 MG TOTAL) BY MOUTH DAILY. Patient not taking: Reported on 07/04/2019 06/28/18   [name], MD   Allergies  Allergen Reactions  . Sulfa Antibiotics   . Morphine And Related Other (See Comments)    Behavioral Issues   Review of Systems  Unable to perform ROS: Acuity of condition    Physical Exam Vitals signs and nursing note reviewed.  Constitutional:      General: She is not in acute distress.  Appearance: She is obese. She is ill-appearing.     Comments: Will only briefly open eyes, does not make eye contact  HENT:     Head: Atraumatic.  Cardiovascular:     Rate and Rhythm: Normal rate.  Pulmonary:     Effort: Pulmonary effort is normal. No respiratory distress.  Abdominal:     Tenderness: There is no abdominal tenderness. There is no guarding.     Comments: Obese abdomen, right lower quadrant hard,red.  Drain in place  Musculoskeletal:        General: Swelling present.     Comments: Marked third spacing  Skin:    General: Skin is warm and dry.     Comments: Right lower quadrant of abdomen red and hard  Neurological:     Comments: Oriented to self only at this time     Vital Signs: BP (!) 122/97   Pulse 99   Temp 98.8 F (37.1 C) (Oral)   Resp 16   Ht 5\' 4"  (1.626 m)   Wt 119.1 kg   SpO2 99%   BMI 45.07 kg/m  Pain Scale: 0-10 POSS *See Group Information*: S-Acceptable,Sleep, easy to arouse Pain Score: 0-No pain   SpO2: SpO2: 99 % O2 Device:SpO2: 99 % O2 Flow  Rate: .O2 Flow Rate (L/min): 2 L/min  IO: Intake/output summary:   Intake/Output Summary (Last 24 hours) at 08/02/2019 1054 Last data filed at 08/02/2019 [id number] Gross per 24 hour  Intake 817.77 ml  Output 2325 ml  Net -1507.23 ml    LBM: Last BM Date: 08/01/19 Baseline Weight: Weight: 117 kg Most recent weight: Weight: 119.1 kg     Palliative Assessment/Data:   Flowsheet Rows     Most Recent Value  Intake Tab  Referral Department  Hospitalist  Unit at Time of Referral  ICU  Palliative Care Primary Diagnosis  Pulmonary  Date Notified  08/01/19  Palliative Care Type  New Palliative care  Reason for referral  Clarify Goals of Care  Date of Admission  07/04/19  Date first seen by Palliative Care  08/02/19  # of days Palliative referral response time  1 Day(s)  # of days IP prior to Palliative referral  28  Clinical Assessment  Palliative Performance Scale Score  30%  Pain Max last 24 hours  Not able to report  Pain Min Last 24 hours  Not able to report  Dyspnea Max Last 24 Hours  Not able to report  Dyspnea Min Last 24 hours  Not able to report  Psychosocial & Spiritual Assessment  Palliative Care Outcomes      Time In: 1010 Time Out: 1100 Time Total: 50 minutes  Greater than 50%  of this time was spent counseling and coordinating care related to the above assessment and plan.  Signed by: Drue Novel, NP   Please contact Palliative Medicine Team phone at [phone] for questions and concerns.  For individual provider: See Shea Evans

## 2019-08-02 NOTE — Progress Notes (Signed)
Spoke with patient's 68 year old mother/POA. Discussed patient's declining condition, failure to eat or take any medication. I asked mother if she could come to the hospital to speak with care team about goals/plan of care and patient. Patient's mother and aunt are coming in tomorrow morning 10/28 at 10:00 am.

## 2019-08-02 NOTE — Progress Notes (Signed)
Patient is still refusing everything and very lathergic. I asked the patient if she would want a feeding tube and she shooked her head no. I asked her if she wants [name] to just continue care that makes her comfortable and she nodded yes.  Will continue to monitor and assist patient with any care she will accept.

## 2019-08-02 NOTE — Progress Notes (Signed)
NAME:  Alice Kemp, MRN:  [id number], DOB:  18-May-1951, LOS: 75 ADMISSION DATE:  07/04/2019, CONSULTATION DATE:  07/31/2019 REFERRING MD:  Dr Arbutus Ped, CHIEF COMPLAINT: Hypotension, hypoxia  Brief History   68 yo F with complicated hospital course (COVID-19 survivor, ascites s/p para, cholecystitis, C Diff), who 10/25 has developed hypoxia and hypotension. Rapid response has seen patient multiple times 10/25. Patient on NRB and being fluid resuscitated. PCCM consulted   History of present illness   History obtained from chart review  68 yo F Obesity, diastolic heart failure, CKD III, DM HTN COVID-19 infection in June admitted from home 05-23-29 for spinal stenosis. Underwent lumbar laminectomy 8/24, discharged to SNF. Complicated by positive [id number] retest, development of cellulitis, abdominal wound infection.  Patient presented 9/28 from [facility] with CC AMS diarrhea, falls. Found to be COVID positive, admitted to [facility].  Found to have Cdiff and assocaited dehydration. Treated with PO vanc. Developed acute cholecystitis and was then transferred from [facility] to [facility]. Underwent paracentesis and perc drainage 10/17 with IR. On Fidoxamin and flagyl per ID for C diff. Patient's home diuretic was resumed for concern of fluid overload.   10/25 patient with new hypoxia and hypotension. After rapid response evaluations, PCCMto see.   Past Medical History  Diastolic CHF Obesity [id number] HTN CKD III DM  C. Diff Cholecystitis Ascites Malnutrition  Depression Hypothyroidism    Significant Hospital Events   9/28 admit to [facility]. CDiff positive 10/17 perc drainage for acute cholecystitis and paracentesis at [facility] 10/25. Hypoxia, on NRB. Hypotension, received IVF and albumin. Transferring to ICU  Consults:  IR ID PCCM  Procedures:  10/17 paracentesis, perc drain  Significant Diagnostic Tests:  10/25 CT chest> no PE. R>L pleural effusion. Bilateral ground glass appearance. LUL  consolidation. [place] pericardial effusion. Ascites.   10/25 CT neck soft tissue> mild SQ edema L anterior lower neck without abscess.   Micro Data:  10/23 SARS CoV2> neg  10/17 bile > rare e.coli  9/28 C.Diff> positve  9/28 COVID-19> positive    Antimicrobials:  Eraxis 10/25> Cefepime 10/25> Fidaxomicin 10/16>   Interim history/subjective:  Awake and communicative. Denies pain. Report poor appetite.  Objective   Blood pressure (!) 122/97, pulse 99, temperature 98.8 F (37.1 C), temperature source Oral, resp. rate 16, height 5\' 4"  (1.626 m), weight 119.1 kg, SpO2 99 %.        Intake/Output Summary (Last 24 hours) at 08/02/2019 1034 Last data filed at 08/02/2019 [id number] Gross per 24 hour  Intake 817.77 ml  Output 2325 ml  Net -1507.23 ml   Filed Weights   08/01/19 0246 08/01/19 2100 08/02/19 0202  Weight: 120.5 kg 119.1 kg 119.1 kg    Examination: General: Chronically ill elderly lady, morbidly obese HENT: Poor dentition, trachea midline. Oral mucosa dry Lungs: Breath sounds are diminished Cardiovascular: S1-S2 appreciated Abdomen: Bowel sounds appreciated Extremities: Anasarca Neuro: Alert and oriented, generalized weakness.   Resolved Hospital Problem list     Assessment & Plan:   Acute encephalopathy, appears to be improving -Metabolic encephalopathy -Suspect from sepsis acute hypoxemia  Was critically ill due to suspected septic shock. Hypotension has resolved No vasopressor requirements -Continue treatment for known C. difficile colitis -Stop all other antibiotics given negative cultures  C. difficile colitis -On Flagyl and for Fidoxamin per ID -Imodium  Chronic frailty and sarcopenia obesity.  Likely intravascularly volume contracted -Continue oral nutrition -Blood pressure likely improved with albumin administration -Gentle oral diuresis -Overall poor functional status portends  poor prognosis.  Ongoing palliative care discussion.  patient  is DNR status  Best practice:  Diet: Diabetic diet, add Ensure Pain/Anxiety/Delirium protocol (if indicated): Neurontin, acetaminophen VAP protocol (if indicated): Not applicable DVT prophylaxis: Lovenox GI prophylaxis:  Glucose control: SSI Mobility: Bedrest Code Status: DNR Family Communication: Will update Disposition: ICU  CRITICAL CARE Performed by: Kipp Brood  Total critical care time: 40 minutes  Critical care time was exclusive of separately billable procedures and treating other patients.  Critical care was necessary to treat or prevent imminent or life-threatening deterioration.  Critical care was time spent personally by me on the following activities: development of treatment plan with patient and/or surrogate as well as nursing, discussions with consultants, evaluation of patient's response to treatment, examination of patient, obtaining history from patient or surrogate, ordering and performing treatments and interventions, ordering and review of laboratory studies, ordering and review of radiographic studies, pulse oximetry, re-evaluation of patient's condition and participation in multidisciplinary rounds.  Kipp Brood, MD [facility] ICU Physician [place]  Pager: [phone] Mobile: [phone] After hours: [phone].

## 2019-08-02 NOTE — Progress Notes (Signed)
 Nutrition Follow-up  DOCUMENTATION CODES:   Morbid obesity  INTERVENTION:   Recommend insertion of Cortrak/NG tube with initiation of TF if aligns with goals of care; noted pt declined Cortrak tube today per RN report  Liberalize diet to REGULAR to promote po intake   Continue Ensure Enlive po TID, each supplement provides 350 kcal and 20 grams of protein  Magic cup TID with meals, each supplement provides 290 kcal and 9 grams of protein  Continue Pro-Stat 30 mL TID  Continue MVI with Minerals   NUTRITION DIAGNOSIS:   Inadequate oral intake related to acute illness(with poor appetite and difficulty chewing) as evidenced by meal completion < 50%.  Being addressed via supplements, nutrition support recommendation  GOAL:   Patient will meet greater than or equal to 90% of their needs  Not Met  MONITOR:   PO intake, Supplement acceptance, Labs, Skin  REASON FOR ASSESSMENT:   LOS    ASSESSMENT:   68 yo female admitted from SNF S/P fall, found to have C. Diff colitis. S/P back surgery in August, when she was incidentally found to have COVID-19. PMH includes hypothyroidism, morbid obesity, OSA, HLD, glaucoma, HF, DM, CKD III.  09/28 Admit to [facility], C.diff positive, COVID-19 + 10/15 Transferred to [facility] 10/17- s/p percutaneous cholecystotomyy and paracentesis- revealed perihepatic ascites and distended gall bladder, 300 ml yellow fluid yielded, 150 ml drak cloduy bile from gallbladder, 10 F drain placed  Pt is refusing meds, food and liquids at this time. Recorded po intake <35% on average; po intake 0-100% of meals  Palliative Care is following; noted pt would qualify for residential hospice. Pt is DNR; awaiting discussion with healthcare POA  Labs:  Potassium 3.2, Creatinine 1.10, BUN Meds: MVI with Minerals   Diet Order:  Heart Healthy   EDUCATION NEEDS:   Not appropriate for education at this time  Skin:  Skin Assessment: Skin Integrity Issues: Skin  Integrity Issues:: Incisions Stage II: coccyx Incisions: lower back Other: MASD to abdomen, perineum  Last BM:  10/27 rectal tube  Height:   Ht Readings from Last 1 Encounters:  07/31/19 '5\' 4"'$  (1.626 m)    Weight:   Wt Readings from Last 1 Encounters:  08/02/19 119.1 kg    Ideal Body Weight:  54.5 kg  BMI:  Body mass index is 45.07 kg/m.  Estimated Nutritional Needs:   Kcal:  2000-2200  Protein:  115-130 gm  Fluid:  >/= 1.8 L    Nyomie Ehrlich MS, RDN, LDN, CNSC [phone] Pager  [phone] Weekend/On-Call Pager

## 2019-08-02 NOTE — Progress Notes (Signed)
Patient is refusing all medications and any food or liquids at this time. Will continue to monitor.

## 2019-08-02 NOTE — Progress Notes (Signed)
[facility] for Infectious Disease  Date of Admission:  07/04/2019      Total days of antibiotics stopped 10/28           ASSESSMENT: Alice Kemp is a 67 y.o. female with acalculous cholecystitis (treated) with history of CDiff and possibly healthcare/aspiration pneumonia. Antibiotics were stopped 10/26. She is now refusing oral medications. It seems her stool output was decreasing and rectal tube has since been removed per chart review.   Was planning to start vanco taper with starting dose 125 mg TID x 7d however she is not accepting oral medications at this time.   PLAN: 1. Stop Fidaxomicin 2. Follow up pending further goals discussions and what patient is willing to accept.     Principal Problem:   Severe sepsis ([place]) Active Problems:   Uncontrolled type 2 diabetes mellitus with stage 3 chronic kidney disease (HCC)   Morbid obesity ([place])   Hypothyroidism   Depression   CKD (chronic kidney disease), stage III (HCC)   Chronic diastolic heart failure (HCC)   Essential hypertension   AKI (acute kidney injury) ([place])   AMS (altered mental status)   Encephalopathy due to COVID-19 virus   Healing pressure injury, stage 2 (HCC)   Acalculous cholecystitis   Lower abdominal pain, unspecified   Leukocytosis   Clostridium difficile diarrhea   Anasarca   Aspiration pneumonia (HCC)   Hypoalbuminemia   Myoclonic jerking   Rash of neck   Acute on chronic respiratory failure with hypoxia (HCC)   Goals of care, counseling/discussion   Palliative care by specialist   . aspirin  81 mg Oral Daily  . buPROPion  100 mg Oral BID  . Chlorhexidine Gluconate Cloth  6 each Topical Daily  . DULoxetine  30 mg Oral BID  . enoxaparin (LOVENOX) injection  60 mg Subcutaneous Q24H  . feeding supplement (ENSURE ENLIVE)  237 mL Oral TID BM  . feeding supplement (PRO-STAT SUGAR FREE 64)  30 mL Oral TID WC  . fidaxomicin  200 mg Oral BID  . gabapentin  300 mg Oral TID  .  Gerhardt's butt cream  1 application Topical TID  . insulin aspart  0-15 Units Subcutaneous Q6H  . levothyroxine  88 mcg Oral QAC breakfast  . mouth rinse  15 mL Mouth Rinse BID  . midodrine  10 mg Oral BID WC  . multivitamin with minerals  1 tablet Oral Daily  . nystatin cream   Topical BID  . rosuvastatin  5 mg Oral Daily  . sodium chloride flush  10-40 mL Intracatheter Q12H  . sodium chloride flush  5 mL Intracatheter Q8H  . sucralfate  1 g Oral TID WC & HS    SUBJECTIVE: Briefly awakens to voice then curls back up holding her pillow to rest.   Interval additions - afebrile. Ongoing palliative care discussions with her 62 yo mother/POA. She is refusing food/drink and medications. 100 cc out from rectal tube - removed today. +11L since admission with pitting anasarca.   Review of Systems: Review of Systems  Unable to perform ROS: Medical condition    Allergies  Allergen Reactions  . Sulfa Antibiotics   . Morphine And Related Other (See Comments)    Behavioral Issues    OBJECTIVE: Vitals:   08/02/19 0700 08/02/19 1100 08/02/19 1200 08/02/19 1300  BP: (!) 122/97 (!) 92/53 (!) 107/59 110/78  Pulse: 99 93 98 97  Resp: 16 15 17  16  Temp:      TempSrc:      SpO2: 99% 90% 99% 96%  Weight:      Height:       Body mass index is 45.07 kg/m.  Physical Exam Constitutional:      Comments: Lethargic. Wakes briefly then falls back asleep. Not verbally offering much   Cardiovascular:     Rate and Rhythm: Normal rate and regular rhythm.  Pulmonary:     Effort: Pulmonary effort is normal.     Breath sounds: Normal breath sounds.  Abdominal:     Palpations: Abdomen is soft.     Tenderness: There is no abdominal tenderness.  Musculoskeletal:     Comments: Anasarca   Skin:    Capillary Refill: Capillary refill takes less than 2 seconds.     Coloration: Skin is pale.     Comments: Cool extremities with pitting edema.      Lab Results Lab Results  Component Value Date    WBC 13.9 (H) 08/02/2019   HGB 7.3 (L) 08/02/2019   HCT 24.7 (L) 08/02/2019   MCV 96.9 08/02/2019   PLT 411 (H) 08/02/2019    Lab Results  Component Value Date   CREATININE 1.10 (H) 08/02/2019   BUN 14 08/02/2019   NA 138 08/02/2019   K 3.2 (L) 08/02/2019   CL 101 08/02/2019   CO2 30 08/02/2019    Lab Results  Component Value Date   ALT 11 07/31/2019   AST 18 07/31/2019   ALKPHOS 85 07/31/2019   BILITOT 0.5 07/31/2019     Microbiology: Recent Results (from the past 240 hour(s))  Aerobic/Anaerobic Culture (surgical/deep wound)     Status: None   Collection Time: 07/23/19  5:02 PM   Specimen: BILE  Result Value Ref Range Status   Specimen Description BILE  Final   Special Requests NONE  Final   Gram Stain NO WBC SEEN RARE GRAM NEGATIVE RODS   Final   Culture   Final    RARE ESCHERICHIA COLI NO ANAEROBES ISOLATED Performed at [facility], [place] [address] [place], [place] [address]    Report Status 07/28/2019 FINAL  Final   Organism ID, Bacteria ESCHERICHIA COLI  Final      Susceptibility   Escherichia coli - MIC*    AMPICILLIN 4 SENSITIVE Sensitive     CEFAZOLIN <=4 SENSITIVE Sensitive     CEFEPIME <=1 SENSITIVE Sensitive     CEFTAZIDIME <=1 SENSITIVE Sensitive     CEFTRIAXONE <=1 SENSITIVE Sensitive     CIPROFLOXACIN <=0.25 SENSITIVE Sensitive     GENTAMICIN <=1 SENSITIVE Sensitive     IMIPENEM <=0.25 SENSITIVE Sensitive     TRIMETH/SULFA <=20 SENSITIVE Sensitive     AMPICILLIN/SULBACTAM <=2 SENSITIVE Sensitive     PIP/TAZO <=4 SENSITIVE Sensitive     Extended ESBL NEGATIVE Sensitive     * RARE ESCHERICHIA COLI  Culture, body fluid-bottle     Status: None (Preliminary result)   Collection Time: 07/23/19  5:55 PM   Specimen: Pleura  Result Value Ref Range Status   Specimen Description PLEURAL FLUID  Final   Special Requests   Final    BOTTLES DRAWN AEROBIC AND ANAEROBIC Blood Culture adequate volume   Culture   Final    NO GROWTH 4 DAYS Performed at  [facility], [address], [place], [place] [address]    Report Status PENDING  Incomplete  Gram stain     Status: None  Collection Time: 07/23/19  5:55 PM   Specimen: Pleura  Result Value Ref Range Status   Specimen Description PLEURAL FLUID  Final   Special Requests NONE  Final   Gram Stain   Final    FEW WBC PRESENT, PREDOMINANTLY PMN NO ORGANISMS SEEN Performed at [facility], [address], [place], [place] [address]    Report Status 07/24/2019 FINAL  Final  SARS CORONAVIRUS 2 (TAT 6-24 HRS) Nasopharyngeal Nasopharyngeal Swab     Status: None   Collection Time: 07/29/19  2:28 PM   Specimen: Nasopharyngeal Swab  Result Value Ref Range Status   SARS Coronavirus 2 NEGATIVE NEGATIVE Final    Comment: (NOTE) SARS-CoV-2 target nucleic acids are NOT DETECTED. The SARS-CoV-2 RNA is generally detectable in upper and lower respiratory specimens during the acute phase of infection. Negative results do not preclude SARS-CoV-2 infection, do not rule out co-infections with other pathogens, and should not be used as the sole basis for treatment or other patient management decisions. Negative results must be combined with clinical observations, patient history, and epidemiological information. The expected result is Negative. Fact Sheet for Patients: SugarRoll.be Fact Sheet for Healthcare Providers: https://www.woods-mathews.com/ This test is not yet approved or cleared by the [place] FDA and  has been authorized for detection and/or diagnosis of SARS-CoV-2 by FDA under an Emergency Use Authorization (EUA). This EUA will remain  in effect (meaning this test can be used) for the duration of the COVID-19 declaration under Section 56 4(b)(1) of the Act, 21 U.S.C. section 360bbb-3(b)(1), unless the authorization is terminated or revoked sooner. Performed at [facility], [place] [address] [place], [place] [address]    Culture, blood (routine x 2)     Status: None (Preliminary result)   Collection Time: 07/31/19  6:51 PM   Specimen: BLOOD LEFT ARM  Result Value Ref Range Status   Specimen Description BLOOD LEFT ARM  Final   Special Requests   Final    BOTTLES DRAWN AEROBIC AND ANAEROBIC Blood Culture adequate volume   Culture   Final    NO GROWTH 2 DAYS Performed at [facility], [place] [address], [place], [place] [address]    Report Status PENDING  Incomplete  Culture, blood (routine x 2)     Status: None (Preliminary result)   Collection Time: 07/31/19  7:05 PM   Specimen: BLOOD RIGHT HAND  Result Value Ref Range Status   Specimen Description BLOOD RIGHT HAND  Final   Special Requests   Final    BOTTLES DRAWN AEROBIC ONLY Blood Culture results may not be optimal due to an inadequate volume of blood received in culture bottles   Culture   Final    NO GROWTH 2 DAYS Performed at [facility], [place] [address] [place], [place] [address]    Report Status PENDING  Incomplete  Fungus culture, blood     Status: None (Preliminary result)   Collection Time: 08/01/19 11:30 AM   Specimen: BLOOD LEFT ARM  Result Value Ref Range Status   Specimen Description BLOOD LEFT ARM  Final   Special Requests   Final    BOTTLES DRAWN AEROBIC ONLY Blood Culture results may not be optimal due to an inadequate volume of blood received in culture bottles   Culture   Final    NO GROWTH < 24 HOURS Performed at [facility], [place] [address] [place], [place] [address]    Report Status PENDING  Incomplete     Janene Madeira, MSN, NP-C [facility] for Infectious Disease [email or web address] Pager: [phone] Office: [phone] [place]: [phone]

## 2019-08-03 DIAGNOSIS — Z7189 Other specified counseling: Secondary | ICD-10-CM | POA: Diagnosis not present

## 2019-08-03 DIAGNOSIS — A419 Sepsis, unspecified organism: Secondary | ICD-10-CM | POA: Diagnosis not present

## 2019-08-03 DIAGNOSIS — E8809 Other disorders of plasma-protein metabolism, not elsewhere classified: Secondary | ICD-10-CM | POA: Diagnosis not present

## 2019-08-03 DIAGNOSIS — R4182 Altered mental status, unspecified: Secondary | ICD-10-CM | POA: Diagnosis not present

## 2019-08-03 LAB — CBC
HCT: 27.4 % — ABNORMAL LOW (ref 36.0–46.0)
Hemoglobin: 8.1 g/dL — ABNORMAL LOW (ref 12.0–15.0)
MCH: 28 pg (ref 26.0–34.0)
MCHC: 29.6 g/dL — ABNORMAL LOW (ref 30.0–36.0)
MCV: 94.8 fL (ref 80.0–100.0)
Platelets: 434 10*3/uL — ABNORMAL HIGH (ref 150–400)
RBC: 2.89 MIL/uL — ABNORMAL LOW (ref 3.87–5.11)
RDW: 22 % — ABNORMAL HIGH (ref 11.5–15.5)
WBC: 10.9 10*3/uL — ABNORMAL HIGH (ref 4.0–10.5)
nRBC: 0 % (ref 0.0–0.2)

## 2019-08-03 LAB — GLUCOSE, CAPILLARY
Glucose-Capillary: 82 mg/dL (ref 70–99)
Glucose-Capillary: 83 mg/dL (ref 70–99)
Glucose-Capillary: 84 mg/dL (ref 70–99)
Glucose-Capillary: 84 mg/dL (ref 70–99)
Glucose-Capillary: 91 mg/dL (ref 70–99)
Glucose-Capillary: 93 mg/dL (ref 70–99)

## 2019-08-03 LAB — BASIC METABOLIC PANEL
Anion gap: 9 (ref 5–15)
BUN: 11 mg/dL (ref 8–23)
CO2: 31 mmol/L (ref 22–32)
Calcium: 7.8 mg/dL — ABNORMAL LOW (ref 8.9–10.3)
Chloride: 99 mmol/L (ref 98–111)
Creatinine, Ser: 1.1 mg/dL — ABNORMAL HIGH (ref 0.44–1.00)
GFR calc Af Amer: 59 mL/min — ABNORMAL LOW (ref >60)
GFR calc non Af Amer: 51 mL/min — ABNORMAL LOW (ref >60)
Glucose, Bld: 91 mg/dL (ref 70–99)
Potassium: 3.3 mmol/L — ABNORMAL LOW (ref 3.5–5.1)
Sodium: 139 mmol/L (ref 135–145)

## 2019-08-03 NOTE — Progress Notes (Signed)
NAME:  Alice Kemp, MRN:  [id number], DOB:  Dec 31, 1950, LOS: 21 ADMISSION DATE:  07/04/2019, CONSULTATION DATE:  07/31/2019 REFERRING MD:  Dr Arbutus Ped, CHIEF COMPLAINT: Hypotension, hypoxia  Brief History   68 yo F with complicated hospital course (COVID-19 survivor, ascites s/p para, cholecystitis, C Diff), who 10/25 has developed hypoxia and hypotension. Rapid response has seen patient multiple times 10/25. Patient on NRB and being fluid resuscitated. PCCM consulted   History of present illness   History obtained from chart review  68 yo F Obesity, diastolic heart failure, CKD III, DM HTN COVID-19 infection in June admitted from home 05-23-29 for spinal stenosis. Underwent lumbar laminectomy 8/24, discharged to SNF. Complicated by positive [id number] retest, development of cellulitis, abdominal wound infection.  Patient presented 9/28 from [facility] with CC AMS diarrhea, falls. Found to be COVID positive, admitted to [facility].  Found to have Cdiff and assocaited dehydration. Treated with PO vanc. Developed acute cholecystitis and was then transferred from [facility] to [facility]. Underwent paracentesis and perc drainage 10/17 with IR. On Fidoxamin and flagyl per ID for C diff. Patient's home diuretic was resumed for concern of fluid overload.   10/25 patient with new hypoxia and hypotension. After rapid response evaluations, PCCMto see.   Past Medical History  Diastolic CHF Obesity [id number] HTN CKD III DM  C. Diff Cholecystitis Ascites Malnutrition  Depression Hypothyroidism    Significant Hospital Events   9/28 admit to [facility]. CDiff positive 10/17 perc drainage for acute cholecystitis and paracentesis at [facility] 10/25. Hypoxia, on NRB. Hypotension, received IVF and albumin. Transferring to ICU  Consults:  IR ID PCCM  Procedures:  10/17 paracentesis, perc drain  Significant Diagnostic Tests:  10/25 CT chest> no PE. R>L pleural effusion. Bilateral ground glass appearance. LUL  consolidation. [place] pericardial effusion. Ascites.   10/25 CT neck soft tissue> mild SQ edema L anterior lower neck without abscess.   Micro Data:  10/23 SARS CoV2> neg  10/17 bile > rare e.coli  9/28 C.Diff> positve  9/28 COVID-19> positive    Antimicrobials:  Eraxis 10/25> Cefepime 10/25> Fidaxomicin 10/16>   Interim history/subjective:  Awake and communicative. Denies pain. Report poor appetite.  Objective   Blood pressure 111/83, pulse (!) 101, temperature 98.3 F (36.8 C), temperature source Axillary, resp. rate (!) 23, height 5\' 4"  (1.626 m), weight 119.1 kg, SpO2 95 %.        Intake/Output Summary (Last 24 hours) at 08/03/2019 1344 Last data filed at 08/03/2019 0800 Gross per 24 hour  Intake 135 ml  Output 3350 ml  Net -3215 ml   Filed Weights   08/01/19 2100 08/02/19 0202 08/03/19 0300  Weight: 119.1 kg 119.1 kg 119.1 kg    Examination: General: Chronically ill elderly lady, morbidly obese HENT: Poor dentition, trachea midline. Oral mucosa dry Lungs: Breath sounds are diminished Cardiovascular: S1-S2 appreciated Abdomen: Bowel sounds appreciated Extremities: Anasarca Neuro: Alert and oriented, generalized weakness.   Resolved Hospital Problem list     Assessment & Plan:   Acute encephalopathy, appears to be improving -Metabolic encephalopathy -Suspect from sepsis acute hypoxemia  Was critically ill due to suspected septic shock. Hypotension has resolved No vasopressor requirements -Continue treatment for known C. difficile colitis -Stop all other antibiotics given negative cultures  C. difficile colitis -On Flagyl and for Fidoxamin per ID -Imodium  Chronic frailty and sarcopenia obesity.  Likely intravascularly volume contracted -Continue oral nutrition -Blood pressure likely improved with albumin administration -Gentle oral diuresis -Overall poor functional status  portends poor prognosis.  Ongoing palliative care discussion.   patient is DNR status  Best practice:  Diet: Diabetic diet, add Ensure Pain/Anxiety/Delirium protocol (if indicated): Neurontin, acetaminophen VAP protocol (if indicated): Not applicable DVT prophylaxis: Lovenox GI prophylaxis:  Glucose control: SSI Mobility: Bedrest Code Status: DNR Family Communication: extensive discussions with palliative care - will pursue comfort care.  Disposition: Transfer to floor -    Kipp Brood, MD [facility] ICU Physician [place]  Pager: [phone] Mobile: [phone] After hours: [phone].

## 2019-08-03 NOTE — Progress Notes (Signed)
Palliative: [name] Kemp, Kemp, is resting quietly in bed.  She will not make eye contact today, does not respond to my basic questions.  She appears acutely/chronically ill and frail.  She is third spacing.  She declines food or drink.  There is no family at bedside at this time.  Meeting with mother, Alice Kemp Kemp, and brother Alice Kemp Kemp.  We talked about Alice Kemp Kemp's life.  They share that she has always been ill.  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  They share that she retired about 8 to 10 years ago.  We talked about Alice Kemp Kemp's home life.  They share that she has groceries and home needs delivered.  She had been able to provide her own self-care until August of this past year.  I asked what gives Alice Kemp Kemp pleasure, and family states her two Pug dogs, but they are unable to name any other source of pleasure for her.  We talk in detail about her acute on chronic health concerns, her frailty, her low albumin.  We talk about the chronic illness pathway, what is normal and expected.  Mother shares that she believes that Alice Kemp Kemp would never be able to live independently, care for herself again.  I share that I would agree with that.  We talked about keeping Alice Kemp Kemp at the center of decision-making, what she would and would not want.  Mother shares that she recently completed advanced directives, she finds this helpful and respecting Alice Kemp Kemp's end-of-life wishes.  We talked about prognosis with permission.  I share that at best I would see 6 months of struggle, if they choose comfort and dignity, 2 weeks at residential hospice is expected.  We talked in detail about what is and is not provided in residential hospice, family states understanding.  Family shares that [facility] is closest to their home.  PMT plans to follow-up with brother, Alice Kemp Kemp, tomorrow for decision related to disposition.  Family asks appropriate questions related to end-of-life care, hospice care.  Conference with  medical team related to patient condition, needs, disposition.   Plan:  No extraordinary measures.  Considering residential Hospice in [place].  PMT to follow up with brother Alice Kemp Kemp on 10/28.   75 minutes, extended time Alice Kemp Axe, NP Palliative Medicine Team Team Phone # [phone] Greater than 50% of this time was spent counseling and coordinating care related to the above assessment and plan.

## 2019-08-04 DIAGNOSIS — R4182 Altered mental status, unspecified: Secondary | ICD-10-CM | POA: Diagnosis not present

## 2019-08-04 DIAGNOSIS — A419 Sepsis, unspecified organism: Secondary | ICD-10-CM | POA: Diagnosis not present

## 2019-08-04 DIAGNOSIS — Z515 Encounter for palliative care: Secondary | ICD-10-CM | POA: Diagnosis not present

## 2019-08-04 DIAGNOSIS — Z7189 Other specified counseling: Secondary | ICD-10-CM | POA: Diagnosis not present

## 2019-08-04 LAB — GLUCOSE, CAPILLARY
Glucose-Capillary: 85 mg/dL (ref 70–99)
Glucose-Capillary: 94 mg/dL (ref 70–99)
Glucose-Capillary: 93 mg/dL (ref 70–99)
Glucose-Capillary: 83 mg/dL (ref 70–99)

## 2019-08-04 MED ORDER — HYDROMORPHONE HCL 1 MG/ML IJ SOLN
0.5000 mg | INTRAMUSCULAR | Status: DC | PRN
  Administered 2019-08-04 – 2019-08-05 (×4): 1 mg via INTRAVENOUS
  Filled 2019-08-04 (×4): qty 1

## 2019-08-04 NOTE — Progress Notes (Signed)
Palliative Medicine RN Note: Rec'd a call from pt's RN; patient is yelling and showing signs of pain; no pain meds ordered. Spoke with PMT NP Aniceto Boss; she gave TORB for hydromorphone IV. RN confirmed with me that med is IV, as pt will not allow them to give her anything PO.  Marjie Skiff Kayte Borchard, RN, BSN, [facility] Palliative Medicine Team 08/04/2019 9:11 AM Office [phone]

## 2019-08-04 NOTE — Progress Notes (Signed)
NAME:  Alice Kemp, MRN:  [id number], DOB:  1950-10-31, LOS: 22 ADMISSION DATE:  07/04/2019, CONSULTATION DATE:  07/31/2019 REFERRING MD:  Dr Arbutus Ped, CHIEF COMPLAINT: Hypotension, hypoxia  Brief History   68 yo F with complicated hospital course (COVID-19 survivor, ascites s/p para, cholecystitis, C Diff), who 10/25 has developed hypoxia and hypotension. Rapid response has seen patient multiple times 10/25. Patient on NRB and being fluid resuscitated. PCCM consulted   History of present illness   History obtained from chart review  68 yo F Obesity, diastolic heart failure, CKD III, DM HTN COVID-19 infection in June admitted from home 05-23-29 for spinal stenosis. Underwent lumbar laminectomy 8/24, discharged to SNF. Complicated by positive [id number] retest, development of cellulitis, abdominal wound infection.  Patient presented 9/28 from [facility] with CC AMS diarrhea, falls. Found to be COVID positive, admitted to [facility].  Found to have Cdiff and assocaited dehydration. Treated with PO vanc. Developed acute cholecystitis and was then transferred from [facility] to [facility]. Underwent paracentesis and perc drainage 10/17 with IR. On Fidoxamin and flagyl per ID for C diff. Patient's home diuretic was resumed for concern of fluid overload.   10/25 patient with new hypoxia and hypotension. After rapid response evaluations, PCCMto see.   Past Medical History  Diastolic CHF Obesity [id number] HTN CKD III DM  C. Diff Cholecystitis Ascites Malnutrition  Depression Hypothyroidism    Significant Hospital Events   9/28 admit to [facility]. CDiff positive 10/17 perc drainage for acute cholecystitis and paracentesis at [facility] 10/25. Hypoxia, on NRB. Hypotension, received IVF and albumin. Transferring to ICU  Consults:  IR ID PCCM  Procedures:  10/17 paracentesis, perc drain  Significant Diagnostic Tests:  10/25 CT chest> no PE. R>L pleural effusion. Bilateral ground glass appearance. LUL  consolidation. [place] pericardial effusion. Ascites.   10/25 CT neck soft tissue> mild SQ edema L anterior lower neck without abscess.   Micro Data:  10/23 SARS CoV2> neg  10/17 bile > rare e.coli  9/28 C.Diff> positve  9/28 COVID-19> positive    Antimicrobials:  Eraxis 10/25> Cefepime 10/25> Fidaxomicin 10/16>   Interim history/subjective:  Resting comfortably after IV pain medication    Objective   Blood pressure 118/79, pulse 96, temperature 98.3 F (36.8 C), temperature source Axillary, resp. rate 18, height 5\' 4"  (1.626 m), weight 119.1 kg, SpO2 95 %.        Intake/Output Summary (Last 24 hours) at 08/04/2019 1157 Last data filed at 08/04/2019 0416 Gross per 24 hour  Intake 5 ml  Output 650 ml  Net -645 ml   Filed Weights   08/01/19 2100 08/02/19 0202 08/03/19 0300  Weight: 119.1 kg 119.1 kg 119.1 kg    Examination: General: Chronically ill elderly lady, morbidly obese HENT: mm dry, no JVD Lungs: resps even non labored on [place]  Cardiovascular: S1-S2  Abdomen: non tender  Extremities: Anasarca Neuro: drowsy but arousable    Resolved Hospital Problem list     Assessment & Plan:   Acute encephalopathy, appears to be improving Pain management per PMT   Was critically ill due to suspected septic shock. Hypotension has resolved No vasopressor requirements -Continue treatment for known C. difficile colitis -Stop all other antibiotics given negative cultures  C. difficile colitis -On Flagyl and for Fidoxamin per ID -Imodium  Chronic frailty and sarcopenia obesity.  Likely intravascularly volume contracted -Continue oral nutrition -Overall poor functional status portends poor prognosis.  Ongoing palliative care discussion. - focus on comfort measures - awaiting  final family decision but suspect transition to residential hospice   patient is DNR status  Best practice:  Diet: Diabetic diet, add Ensure Pain/Anxiety/Delirium protocol (if indicated):  Neurontin, acetaminophen VAP protocol (if indicated): Not applicable DVT prophylaxis: Lovenox GI prophylaxis:  Glucose control: SSI Mobility: Bedrest Code Status: DNR Family Communication: extensive discussions with palliative care - will pursue comfort care.  Disposition: continue comfort measures on floor, awaiting plans for ??residential hospice dc     Nickolas Madrid, NP 08/04/2019  11:57 AM Pager: [phone] or [phone]

## 2019-08-04 NOTE — Progress Notes (Signed)
CSW spoke with NP Quinn Axe from PMT. Family is leaning towards hospice and would be here tomorrow. They would like [facility].   CSW called Sherrie with [facility] and made a referral for [facility]. CSW informed her to wait to call family until Friday. She stated she would start working on the referral. CSW informed her that they would touch base on Friday morning.   CSW will continue to follow and assist with disposition planning.   Domenic Schwab, MSW, [place] Worker [facility]  [phone]

## 2019-08-04 NOTE — Progress Notes (Signed)
New Admission Note: ? Arrival Method: via bed, Pt. Transferred from [facility] Orientation: A/O x 1, nonverbal, response to voice Telemetry: Box #5 Assessment: Completed Skin: Refer to flowsheet IV: RUA PICC Pain: Asleep Tubes: Safety Measures: Safety Fall Prevention Plan discussed with patient. Admission: Completed 5 [place] Orientation: Patient has been orientated to the room, unit and the staff. Family: Orders have been reviewed and are being implemented. Will continue to monitor the patient. Call light has been placed within reach and bed alarm has been activated.  ? [employer], [place]

## 2019-08-04 NOTE — Progress Notes (Addendum)
Palliative: [name] Kemp, Kemp, is lying quietly in bed.  Her eyes are open, but she will not make eye contact.  She does not respond to me in any meaningful way.  She does not respond when asked if she would like food or liquid.  She remains unchanged, acutely/chronically ill and frail.  There is no family at bedside at this time.    Call to brother, Alice Kemp Kemp at [phone].  He shares that family continued discussion, last night, and sister in [place], who is a Insurance claims handler" had questions about "kidney output and other things".  Phone number [phone].  Alice Kemp Kemp shares that Alice Kemp Kemp used to work in [employer], and now he believes travels to provide physicals.  For states that after Alice Kemp Kemp has information, they will talk further.  Alice Kemp Kemp shares that this may be later this afternoon or possibly tomorrow before they can reach a decision.   Call to sister-in-law, Alice Kemp Kemp at [phone].  Left callback number, also detailed information related to Alice Kemp Kemp declining to take food or liquid, selected labs reviewed such as kidney function, white blood cells, albumin.    Conference with bedside nursing staff related to patient condition, needs, symptom management.  Visit with Sister in law Alice Kemp.  We talk about Alice Kemp's acute and chronic health concerns in detail.  We also talk about declining functional status and possibility for outcomes of improvement. Alice Kemp states that she believes that hospice care would be best and she will talk with her husband and brother in law Alice Kemp Kemp. Alice Kemp Kemp shares that Alice Kemp Kemp tells her that he feels that he is "Alice Kemp Kemp" and Alice Kemp Kemp asks "are we giving up too soon?"   Call to brother Alice Kemp Kemp at [phone].  NO answer, left VM. Call to Alice Kemp Kemp after 5pm, goes right to VM. No message left.   PMT to call tomorrow morning.  Team updated.   Plan:  At this point, continue to treat the treatable, but no extraordinary measures.   Prognosis: 2 weeks or  less expected due to acute and chronic illness burden, albumin 1.1, frailty, no by mouth intake.  65 minutes, extended time.  Alice Kemp Axe, NP Palliative medicine team Team phone number [phone] Greater than 50% of this time was spent counseling and coordinating care related to the above assessment and plan.

## 2019-08-04 NOTE — Progress Notes (Signed)
Late Entry,  This morning patient was calling out, grimacing, and very tense. Paged palliative for PRN. Patient has been resting comfortably since dilaudid was given. Family called for an update, explained she still is not eating or speaking but that she now appears more comfortable. Family member appreciative of the update.

## 2019-08-05 DIAGNOSIS — R6520 Severe sepsis without septic shock: Secondary | ICD-10-CM | POA: Diagnosis not present

## 2019-08-05 DIAGNOSIS — Z7189 Other specified counseling: Secondary | ICD-10-CM | POA: Diagnosis not present

## 2019-08-05 DIAGNOSIS — A419 Sepsis, unspecified organism: Secondary | ICD-10-CM | POA: Diagnosis not present

## 2019-08-05 DIAGNOSIS — R4182 Altered mental status, unspecified: Secondary | ICD-10-CM | POA: Diagnosis not present

## 2019-08-05 DIAGNOSIS — R601 Generalized edema: Secondary | ICD-10-CM | POA: Diagnosis not present

## 2019-08-05 LAB — BASIC METABOLIC PANEL
Anion gap: 12 (ref 5–15)
BUN: 12 mg/dL (ref 8–23)
CO2: 29 mmol/L (ref 22–32)
Calcium: 7.7 mg/dL — ABNORMAL LOW (ref 8.9–10.3)
Chloride: 96 mmol/L — ABNORMAL LOW (ref 98–111)
Creatinine, Ser: 1.28 mg/dL — ABNORMAL HIGH (ref 0.44–1.00)
GFR calc Af Amer: 49 mL/min — ABNORMAL LOW (ref >60)
GFR calc non Af Amer: 43 mL/min — ABNORMAL LOW (ref >60)
Glucose, Bld: 162 mg/dL — ABNORMAL HIGH (ref 70–99)
Potassium: 3.6 mmol/L (ref 3.5–5.1)
Sodium: 137 mmol/L (ref 135–145)

## 2019-08-05 LAB — CBC
HCT: 26.2 % — ABNORMAL LOW (ref 36.0–46.0)
Hemoglobin: 8 g/dL — ABNORMAL LOW (ref 12.0–15.0)
MCH: 28.7 pg (ref 26.0–34.0)
MCHC: 30.5 g/dL (ref 30.0–36.0)
MCV: 93.9 fL (ref 80.0–100.0)
Platelets: 415 10*3/uL — ABNORMAL HIGH (ref 150–400)
RBC: 2.79 MIL/uL — ABNORMAL LOW (ref 3.87–5.11)
RDW: 22.4 % — ABNORMAL HIGH (ref 11.5–15.5)
WBC: 15.9 10*3/uL — ABNORMAL HIGH (ref 4.0–10.5)
nRBC: 0 % (ref 0.0–0.2)

## 2019-08-05 LAB — CULTURE, BLOOD (ROUTINE X 2)
Culture: NO GROWTH
Culture: NO GROWTH
Special Requests: ADEQUATE

## 2019-08-05 LAB — GLUCOSE, CAPILLARY
Glucose-Capillary: 105 mg/dL — ABNORMAL HIGH (ref 70–99)
Glucose-Capillary: 151 mg/dL — ABNORMAL HIGH (ref 70–99)
Glucose-Capillary: 140 mg/dL — ABNORMAL HIGH (ref 70–99)

## 2019-08-05 MED ORDER — PANTOPRAZOLE SODIUM 40 MG IV SOLR
40.0000 mg | Freq: Two times a day (BID) | INTRAVENOUS | Status: DC
  Administered 2019-08-05: 40 mg via INTRAVENOUS
  Filled 2019-08-05: qty 40

## 2019-08-05 MED ORDER — DEXTROSE IN LACTATED RINGERS 5 % IV SOLN
INTRAVENOUS | Status: DC
  Administered 2019-08-05: 07:00:00 via INTRAVENOUS

## 2019-08-05 MED ORDER — POLYVINYL ALCOHOL 1.4 % OP SOLN
2.0000 [drp] | OPHTHALMIC | Status: DC | PRN
  Filled 2019-08-05: qty 15

## 2019-08-05 MED ORDER — HYDROMORPHONE HCL 1 MG/ML IJ SOLN
1.0000 mg | INTRAMUSCULAR | Status: DC | PRN
  Administered 2019-08-05: 2 mg via INTRAVENOUS
  Filled 2019-08-05: qty 2

## 2019-08-05 MED ORDER — LORAZEPAM 2 MG/ML IJ SOLN: 1.0000 mg | Freq: Four times a day (QID) | INTRAMUSCULAR | Status: DC | PRN

## 2019-08-05 NOTE — TOC Transition Note (Signed)
Transition of Care [facility]) - CM/SW Discharge Note   Patient Details  Name: Alice Kemp MRN: [id number] Date of Birth: 1951/07/26  [facility] CM/SW Contact:  Gelene Mink, [place] Phone Number: 08/05/2019, 1:21 PM   Clinical Narrative:     Patient will DC to: [facility] Anticipated DC date: 08/05/2019 Family notified: Yes Transport by: Corey Harold   Per MD patient ready for DC to . RN, patient, patient's family, and facility notified of DC. Discharge Summary and FL2 sent to facility. RN to call report prior to discharge [phone]). DC packet on chart. Ambulance transport requested for patient.   CSW will sign off for now as social work intervention is no longer needed. Please consult [name] again if new needs arise.  Corrado Hymon, LCSW-A [place]/Clinical Social Work Department Cell: [phone]    Final next level of care: [place] Barriers to Discharge: No Barriers Identified   Patient Goals and CMS Choice Patient states their goals for this hospitalization and ongoing recovery are:: Pt will transition to comfort care. CMS Medicare.gov Compare Post Acute Care list provided to:: Patient Represenative (must comment) Choice offered to / list presented to : Adult Children  Discharge Placement              Patient chooses bed at: Other - please specify in the comment section below:([facility]) Patient to be transferred to facility by: [place] Name of family member notified: Forrest, son Patient and family notified of of transfer: 08/05/19  Discharge Plan and Services In-house Referral: Clinical Social Work Discharge Planning Services: NA Post Acute Care Choice: [place]          DME Arranged: N/A DME Agency: NA       HH Arranged: NA [place] Agency: NA        Social Determinants of Health (SDOH) Interventions     Readmission Risk Interventions Readmission Risk Prevention Plan 07/05/2019   Transportation Screening Complete  PCP or Specialist Appt within 3-5 Days Complete  HRI or [place] Complete  Social Work Consult for [place] Planning/Counseling Complete  Palliative Care Screening Not Applicable  Medication Review Press photographer) Complete  PCP or Specialist appointment within 3-5 days of discharge Not Complete  PCP/Specialist Appt Not Complete comments plan for SNF  HRI or [place] Not Complete  HRI or Home Care Consult Pt Refusal Comments plan for SNF  SW Recovery Care/Counseling Consult Complete  Palliative Care Screening Not Applicable  Skilled Nursing Facility Complete  Some recent data might be hidden

## 2019-08-05 NOTE — Discharge Summary (Addendum)
Discharge Summary  Alice Kemp [id number] DOB: 1951-07-17  PCP: [name], MD  Admit date: 07/04/2019 Discharge date: 08/05/2019  Time spent: 35 minutes  Recommendations for Outpatient Follow-up:  1. Follow-up with hospice care.  Discharge Diagnoses:  Active Hospital Problems   Diagnosis Date Noted   Severe sepsis ([place]) 07/31/2019   Encounter for hospice care discussion    Goals of care, counseling/discussion    Palliative care by specialist    Aspiration pneumonia ([place]) 07/31/2019   Hypoalbuminemia 07/31/2019   Myoclonic jerking 07/31/2019   Rash of neck 07/31/2019   Acute on chronic respiratory failure with hypoxia ([place]) 07/31/2019   Acalculous cholecystitis    Lower abdominal pain, unspecified    Leukocytosis    Clostridium difficile diarrhea    Anasarca    Healing pressure injury, stage 2 ([place]) 07/11/2019   AMS (altered mental status) 07/04/2019   Encephalopathy due to COVID-19 virus 07/04/2019   AKI (acute kidney injury) ([place]) 05/24/2019   Essential hypertension 10/15/2017   Chronic diastolic heart failure ([place]) 09/23/2013   CKD (chronic kidney disease), stage III ([place])    Depression 02/11/2013   Hypothyroidism 02/14/2011   Morbid obesity ([place]) 02/14/2011   Uncontrolled type 2 diabetes mellitus with stage 3 chronic kidney disease ([place]) 12/02/2010    Resolved Hospital Problems  No resolved problems to display.    Discharge Condition: Stable  Diet recommendation: Pleasure feedings as tolerated.  Vitals:   08/04/19 1832 08/04/19 2039  BP: 96/81 125/74  Pulse: 92 88  Resp: 14 18  Temp: 98.2 F (36.8 C) 97.7 F (36.5 C)  SpO2: 91% 93%    History of present illness:    Brief History   68 yo F with complicated hospital course (COVID-19 survivor, ascites s/p para, cholecystitis, C Diff), who 10/25 has developed hypoxia and hypotension. Rapid response has seen patient multiple times 10/25. Patient on NRB and being fluid  resuscitated. PCCM consulted  History of present illness   History obtained from chart review  68 yo F Obesity, diastolic heart failure, CKD III, DM HTN COVID-19 infection in June admitted from home 05-23-29 for spinal stenosis. Underwent lumbar laminectomy 8/24, discharged to SNF. Complicated by positive [id number] retest, development of cellulitis, abdominal wound infection.  Patient presented 9/28 from [facility] with CC AMS diarrhea, falls. Found to be COVID positive, admitted to [facility].  Found to have Cdiff and assocaited dehydration. Treated with PO vanc. Developed acute cholecystitis and was then transferred from [facility] to [facility]. Underwent paracentesis and perc drainage 10/17 with IR. On Fidoxamin and flagyl per ID for C diff. Patient's home diuretic was resumed for concern of fluid overload.   10/25 patient with new hypoxia and hypotension. After rapid response evaluations, PCCMto see.  Past Medical History  Diastolic CHF Obesity [id number] HTN CKD III DM  C. Diff Cholecystitis Ascites Malnutrition  Depression Hypothyroidism   Significant Hospital Events   9/28 admit to [facility]. CDiff positive 10/17 perc drainage for acute cholecystitis and paracentesis at [facility] 10/25. Hypoxia, on NRB. Hypotension, received IVF and albumin. Transferring to ICU  Consults:  IR ID PCCM  Procedures:  10/17 paracentesis, perc drain  Significant Diagnostic Tests:  10/25 CT chest>no PE. R>L pleural effusion. Bilateral ground glass appearance. LUL consolidation. [place] pericardial effusion. Ascites.   10/25 CT neck soft tissue> mild SQ edema L anterior lower neck without abscess.  Micro Data:  10/23 SARS CoV2>neg  10/17 bile >rare e.coli  9/28 C.Diff>positve  9/28 COVID-19> positive  Antimicrobials:  Eraxis 10/25> Cefepime 10/25> Fidaxomicin 10/16>  Interim history/subjective:    08/05/19: Patient was seen and examined at bedside this morning.  Family made decision  for hospice care.  Patient has a bed available at [facility].  Vital signs reviewed and are stable.  Patient is medically stable to discharge to hospice care.  Hospital Course:  Principal Problem:   Severe sepsis ([place]) Active Problems:   Uncontrolled type 2 diabetes mellitus with stage 3 chronic kidney disease (HCC)   Morbid obesity (HCC)   Hypothyroidism   Depression   CKD (chronic kidney disease), stage III (HCC)   Chronic diastolic heart failure (HCC)   Essential hypertension   AKI (acute kidney injury) ([place])   AMS (altered mental status)   Encephalopathy due to COVID-19 virus   Healing pressure injury, stage 2 (HCC)   Acalculous cholecystitis   Lower abdominal pain, unspecified   Leukocytosis   Clostridium difficile diarrhea   Anasarca   Aspiration pneumonia (HCC)   Hypoalbuminemia   Myoclonic jerking   Rash of neck   Acute on chronic respiratory failure with hypoxia (HCC)   Goals of care, counseling/discussion   Palliative care by specialist   Encounter for hospice care discussion   Acute metabolic encephalopathy, persistent Pain management per hospice care.  Was critically ill due to suspected septic shock. Hypotension has resolved No vasopressor requirements -Received treatment for known C. difficile colitis  C. difficile colitis -Received treatment -No diarrhea reported overnight per bedside night RN Nghus.  Acalcalus cholecystitis Bile cx ecoli, pansensitive Received treatment  Covid-19 PNA Positive covid-19 test on 07/04/19 Repeat covid-19 test  Negative on 07/29/19.  Chronic frailty and sarcopenia obesity.  Likely intravascularly volume contracted -Family made decision for comfort care only. - focus on comfort measures  -Hospice care  Possible coffee-ground emesis Patient is comfort measures only as of 08/05/19 All focus on comfort per Palliative care team    DNR with Comfort Measures Only.   Discharge Exam: BP 125/74 (BP  Location: Left Arm)    Pulse 88    Temp 97.7 F (36.5 C)    Resp 18    Ht 5\' 4"  (1.626 m)    Wt 119.1 kg    SpO2 93%    BMI 45.07 kg/m   General: 68 y.o. year-old female severely morbid obese in no acute distress.  Alert and minimally interactive.  Cardiovascular: Regular rate and rhythm with no rubs or gallops.  No thyromegaly or JVD noted.    Respiratory: Clear to auscultation with no wheezes or rales. Good inspiratory effort.  Abdomen: Soft nontender nondistended with normal bowel sounds x4 quadrants. Biliary drain in place.  Musculoskeletal: Dependent edema all throughout.  Psychiatry: Unable to assess mood due to altered mental status.  Discharge Instructions You were cared for by a hospitalist during your hospital stay. If you have any questions about your discharge medications or the care you received while you were in the hospital after you are discharged, you can call the unit and asked to speak with the hospitalist on call if the hospitalist that took care of you is not available. Once you are discharged, your primary care physician will handle any further medical issues. Please note that NO REFILLS for any discharge medications will be authorized once you are discharged, as it is imperative that you return to your primary care physician (or establish a relationship with a primary care physician if you do not have one) for your aftercare needs so that  they can reassess your need for medications and monitor your lab values.   Allergies as of 08/05/2019      Reactions   Sulfa Antibiotics    Morphine And Related Other (See Comments)   Behavioral Issues      Medication List    STOP taking these medications   acetaminophen 500 MG tablet Commonly known as: TYLENOL   allopurinol 100 MG tablet Commonly known as: ZYLOPRIM   aspirin 81 MG tablet   benzonatate 100 MG capsule Commonly known as: TESSALON   bisacodyl 10 MG suppository Commonly known as: DULCOLAX   buPROPion  200 MG 12 hr tablet Commonly known as: Wellbutrin SR   celecoxib 100 MG capsule Commonly known as: CELEBREX   colchicine 0.6 MG tablet   DULoxetine 30 MG capsule Commonly known as: Cymbalta   escitalopram 10 MG tablet Commonly known as: LEXAPRO   ferrous sulfate 325 (65 FE) MG tablet   fluticasone 0.05 % cream Commonly known as: CUTIVATE   furosemide 80 MG tablet Commonly known as: LASIX   gabapentin 300 MG capsule Commonly known as: NEURONTIN   insulin aspart 100 UNIT/ML injection Commonly known as: novoLOG   insulin glargine 100 UNIT/ML injection Commonly known as: LANTUS   levothyroxine 88 MCG tablet Commonly known as: SYNTHROID   loperamide 2 MG tablet Commonly known as: IMODIUM A-D   losartan 25 MG tablet Commonly known as: COZAAR   Magnesium 200 MG Tabs   meclizine 25 MG tablet Commonly known as: ANTIVERT   methocarbamol 500 MG tablet Commonly known as: ROBAXIN   ondansetron 4 MG tablet Commonly known as: ZOFRAN   OneTouch Verio test strip Generic drug: glucose blood   Oxycodone HCl 10 MG Tabs   pantoprazole 40 MG tablet Commonly known as: PROTONIX   polyethylene glycol 17 g packet Commonly known as: MIRALAX / GLYCOLAX   rosuvastatin 5 MG tablet Commonly known as: Crestor   saccharomyces boulardii 250 MG capsule Commonly known as: FLORASTOR   senna-docusate 8.6-50 MG tablet Commonly known as: Senokot-S   sodium chloride 0.9 %   spironolactone 25 MG tablet Commonly known as: ALDACTONE   vitamin B-12 500 MCG tablet Commonly known as: CYANOCOBALAMIN      Allergies  Allergen Reactions   Sulfa Antibiotics    Morphine And Related Other (See Comments)    Behavioral Issues   Follow-up Information    Greer Pickerel, MD. Schedule an appointment as soon as possible for a visit in 6 week(s).   Specialty: General Surgery Why: in 6-8 weeks to discuss gallbladder removal Contact information: [address] [place]  [phone]            The results of significant diagnostics from this hospitalization (including imaging, microbiology, ancillary and laboratory) are listed below for reference.    Significant Diagnostic Studies: Ct Abdomen Pelvis Wo Contrast  Result Date: 07/21/2019 CLINICAL DATA:  Abdominal pain and fever.  Evaluate for an abscess. EXAM: CT ABDOMEN AND PELVIS WITHOUT CONTRAST TECHNIQUE: Multidetector CT imaging of the abdomen and pelvis was performed following the standard protocol without IV contrast. COMPARISON:  Chest CT 12/05/2010 FINDINGS: Lower chest: Bilateral pleural effusions, right side greater than left. Right pleural effusion may be moderate in size. Again noted is a pericardial effusion and minimally changed from the prior chest CT examination. Hepatobiliary: Gallbladder is distended with high-density material suggestive for stones or sludge. Gallbladder measures up to 10 cm in largest dimension. Eoff to moderate amount of  perihepatic ascites. Limited evaluation of the liver on this noncontrast examination. No significant extrahepatic biliary dilatation. Pancreas: Unremarkable. No pancreatic ductal dilatation or surrounding inflammatory changes. Spleen: Normal appearance of the spleen without enlargement. Cannot exclude trace perisplenic ascites. Adrenals/Urinary Tract: Normal appearance of the adrenal glands. No definite kidney stones. No hydronephrosis. Urinary bladder is decompressed with a Foley catheter. Stomach/Bowel: Normal appearance of the stomach and duodenum. Diverticula involving the sigmoid colon. No evidence for bowel obstruction or focal bowel inflammation. Vascular/Lymphatic: Atherosclerotic calcifications involving the aorta and visceral arteries. Negative for an abdominal aortic aneurysm. Mildly prominent lymph nodes in the pelvis. Index lymph node measures 1.2 cm in the short axis along the right iliac chain on sequence 2, image 72. Mildly prominent lymph  nodes are nonspecific. Reproductive: Uterus and bilateral adnexa are poorly characterized on this examination but appear to be unremarkable. Other: Severe subcutaneous edema. Perihepatic ascites. Nitschke amount of ascites in the right paracolic gutter. Stokes amount of ascites in the pelvis. Negative for free intraperitoneal air. Musculoskeletal: Multilevel disc space disease. Disc disease is most prominent at L4-L5 and T12-L1. IMPRESSION: 1. Distended gallbladder with high-density material that could represent stones or sludge. In addition, there is perihepatic ascites. These findings are concerning for acute cholecystitis. Consider further characterization with a right upper quadrant ultrasound. 2. Bilateral pleural effusions, right side greater than left. Severe subcutaneous edema. Findings could represent fluid overload state. 3. Persistent pericardial effusion. Minimal change since exam in 2012. 4. Borderline lymphadenopathy in the pelvis. Pelvic lymph nodes are nonspecific but could be reactive. Electronically Signed   By: Markus Daft M.D.   On: 07/21/2019 13:09   Ct Soft Tissue Neck W Contrast  Result Date: 07/31/2019 CLINICAL DATA:  Acute onset redness and induration left anterior neck. Rule out infection. EXAM: CT NECK WITH CONTRAST TECHNIQUE: Multidetector CT imaging of the neck was performed using the standard protocol following the bolus administration of intravenous contrast. CONTRAST:  116mL OMNIPAQUE IOHEXOL 350 MG/ML SOLN COMPARISON:  None. FINDINGS: Pharynx and larynx: Normal. No mass or swelling. Salivary glands: No inflammation, mass, or stone. Thyroid: Negative Lymph nodes: No enlarged lymph nodes in the neck. Vascular: Normal vascular enhancement Limited intracranial: Negative Visualized orbits: No orbital mass or edema. Bilateral cataract surgery Mastoids and visualized paranasal sinuses: Negative Skeleton: Cervical spondylosis.  No acute skeletal abnormality. Upper chest: Moderately large  right pleural effusion and Henle left effusion. Mild infiltrate left upper lobe. Other: Mild subcutaneous edema left anterior lower chest. No significant skin thickening. No soft tissue abscess. IMPRESSION: 1. Mild subcutaneous edema left anterior lower neck without abscess. 2. Negative for mass or adenopathy in the neck 3. Bilateral pleural effusions right greater than left. Left upper lobe infiltrate. Possible pneumonia or edema. Electronically Signed   By: Franchot Gallo M.D.   On: 07/31/2019 14:27   Ct Angio Chest Pe W Or Wo Contrast  Result Date: 07/31/2019 CLINICAL DATA:  PE suspected, new hypoxia and tachycardia EXAM: CT ANGIOGRAPHY CHEST WITH CONTRAST TECHNIQUE: Multidetector CT imaging of the chest was performed using the standard protocol during bolus administration of intravenous contrast. Multiplanar CT image reconstructions and MIPs were obtained to evaluate the vascular anatomy. CONTRAST:  137mL OMNIPAQUE IOHEXOL 350 MG/ML SOLN COMPARISON:  Chest radiograph, 07/21/2019, CT chest angiogram, 12/05/2010 FINDINGS: Cardiovascular: Satisfactory opacification of the pulmonary arteries to the segmental level. No evidence of pulmonary embolism. Normal heart size. Left coronary artery calcifications. Jarrells pericardial effusion. Aortic atherosclerosis. Mediastinum/Nodes: No enlarged mediastinal, hilar, or axillary lymph nodes.  Thyroid gland, trachea, and esophagus demonstrate no significant findings. Lungs/Pleura: Moderate right, [place] left pleural effusions and associated atelectasis or consolidation. There is scattered, irregular ground-glass opacity of the bilateral lungs, most conspicuous in the left upper lobe. Upper Abdomen: Ascites in the included upper abdomen. Musculoskeletal: No chest wall abnormality. No acute or significant osseous findings. Review of the MIP images confirms the above findings. IMPRESSION: 1.  Negative examination for pulmonary embolism. 2. Moderate right, Jeffords left pleural  effusions and associated atelectasis or consolidation. 3. There is scattered, irregular ground-glass opacity of the bilateral lungs, most conspicuous in the left upper lobe and consistent with infection or inflammation. 4.  Travieso pericardial effusion. 5.  Coronary artery disease.  Aortic Atherosclerosis (ICD10-I70.0). 6.  Ascites. Electronically Signed   By: Eddie Candle M.D.   On: 07/31/2019 14:42   Nm Hepatobiliary Liver Func  Result Date: 07/22/2019 CLINICAL DATA:  68 year old female with acute abdominal pain. EXAM: NUCLEAR MEDICINE HEPATOBILIARY IMAGING TECHNIQUE: Sequential images of the abdomen were obtained out to 60 minutes following intravenous administration of radiopharmaceutical. RADIOPHARMACEUTICALS:  4.9 mCi Tc-87m  Choletec IV COMPARISON:  07/21/2019 ultrasound and CT FINDINGS: Prompt uptake and biliary excretion of activity by the liver is seen. Biliary activity passes into Dambrosia bowel, consistent with patent common bile duct. Gallbladder activity is not visualized after 2 hours post radiopharmaceutical injection. The patient was not given morphine due to allergy. IMPRESSION: Nonvisualization of the gallbladder which is suspicious for cystic duct obstruction/acute cholecystitis. However, correlate with causes of false-positive studies as morphine could not be given to constrict the sphincter of Oddi due to allergy. These results will be called to the ordering clinician or representative by the Radiologist Assistant, and communication documented in the PACS or zVision Dashboard. Electronically Signed   By: Margarette Canada M.D.   On: 07/22/2019 16:17   Ir Perc Cholecystostomy  Result Date: 07/23/2019 INDICATION: 68 year old with acute cholecystitis and recent COVID-19 infection. Patient is not a surgical candidate at this time. Plan for percutaneous cholecystostomy tube placement. EXAM: PERCUTANEOUS CHOLECYSTOSTOMY TUBE PLACEMENT WITH ULTRASOUND AND FLUOROSCOPIC GUIDANCE ULTRASOUND-GUIDED  PARACENTESIS MEDICATIONS: Moderate sedation ANESTHESIA/SEDATION: Moderate (conscious) sedation was employed during this procedure. A total of Versed 1.0 mg and Fentanyl 50 mcg was administered intravenously. Moderate Sedation Time: 23 minutes. The patient's level of consciousness and vital signs were monitored continuously by radiology nursing throughout the procedure under my direct supervision. FLUOROSCOPY TIME:  Fluoroscopy Time: 36 seconds, 4 mGy COMPLICATIONS: None immediate. PROCEDURE: Informed written consent was obtained from the patient after a thorough discussion of the procedural risks, benefits and alternatives. All questions were addressed. Maximal Sterile Barrier Technique was utilized including caps, mask, sterile gowns, sterile gloves, sterile drape, hand hygiene and skin antiseptic. A timeout was performed prior to the initiation of the procedure. Patient was placed supine. The right abdomen was prepped and draped in sterile fashion. Ultrasound was used to identify a distended gallbladder and perihepatic ascites. Skin was anesthetized with 1% lidocaine. Using ultrasound guidance, a Yueh catheter was directed into the perihepatic fluid adjacent to the gallbladder. Yellow clear fluid was aspirated. Approximately 300 mL of fluid was removed from the perihepatic space. Ultrasound was used to direct a 21 gauge needle into the distended gallbladder. 0.018 wire was advanced into the gallbladder under ultrasound guidance. An Accustick dilator set was placed. The tract was dilated over a J wire to accommodate a 10.2 [place] multipurpose drain. Greater than 150 mL of dark cloudy thick bilious fluid was aspirated from the  gallbladder. Gallbladder was decompressed at the end of the procedure. Catheter was sutured to skin and attached to gravity bag. Fluoroscopic and ultrasound images were taken and saved for documentation. FINDINGS: Approximately 300 mL of clear yellow ascites was removed from the perihepatic  space. 10.2 French drain was placed in the gallbladder and the gallbladder was successfully decompressed. IMPRESSION: 1. Successful percutaneous cholecystostomy tube placement with ultrasound and fluoroscopic guidance. Fluid was sent for culture. 2. Ultrasound-guided paracentesis. Ascites fluid was sent for culture. Electronically Signed   By: Markus Daft M.D.   On: 07/23/2019 17:41   Dg Chest Port 1 View  Result Date: 07/21/2019 CLINICAL DATA:  Covid positive.  Short of breath EXAM: PORTABLE CHEST 1 VIEW COMPARISON:  Radiograph 07/11/2019 FINDINGS: PICC line unchanged. Normal cardiac silhouette. No effusion, infiltrate or pneumothorax. Mild LEFT basilar atelectasis. IMPRESSION: 1. No significant change. 2. No evidence pneumonia. 3. Mild LEFT basilar atelectasis. Electronically Signed   By: Suzy Bouchard M.D.   On: 07/21/2019 10:39   Dg Chest Port 1 View  Result Date: 07/11/2019 CLINICAL DATA:  Evaluate PICC tip. EXAM: PORTABLE CHEST 1 VIEW COMPARISON:  Chest x-ray from same day. FINDINGS: Interval retraction of the right upper extremity PICC line with the tip now in the distal SVC. Stable cardiomediastinal silhouette. Normal pulmonary vascularity. Continued low lung volumes with mild bibasilar atelectasis, slightly improved. No focal consolidation, pleural effusion, or pneumothorax. No acute osseous abnormality. IMPRESSION: 1. Right upper extremity PICC line tip now in the distal SVC. 2. Continued low lung volumes with mildly improved bibasilar atelectasis. Electronically Signed   By: Titus Dubin M.D.   On: 07/11/2019 18:41   Dg Chest Port 1 View  Result Date: 07/11/2019 CLINICAL DATA:  PICC placement EXAM: PORTABLE CHEST 1 VIEW COMPARISON:  07/04/2019 FINDINGS: Right arm PICC is been placed. Tip in the mid right atrium. Recommend withdrawal of 3 cm for optimum position Hypoventilation with bibasilar atelectasis.  No effusion or edema. IMPRESSION: PICC tip in the mid right atrium.  Recommend  withdrawal 3 cm Hypoventilation with bibasilar atelectasis. Electronically Signed   By: Franchot Gallo M.D.   On: 07/11/2019 17:30   Dg Abd Portable 1v  Result Date: 07/21/2019 CLINICAL DATA:  COVID positive pt. Pt c/o abdominal pain EXAM: PORTABLE ABDOMEN - 1 VIEW COMPARISON:  07/11/2019 FINDINGS: Bowel gas pattern is nonobstructed. There is heterogeneous lucency projecting LATERAL to the LEFT chest wall. This could be related to stomach and significant abdominal wall laxity. Is difficult to exclude abscess or loculated extraluminal gas. Consider CT of the abdomen and pelvis as needed. Multiple remote rib fractures. IMPRESSION: 1. Abnormal lucency to the LEFT chest wall consistent with stomach or loculated extraluminal air. Consider CT of the abdomen and pelvis. 2. Multiple remote rib fractures. These results were called by telephone at the time of interpretation on 07/21/2019 at 10:12 am to provider [facility] , who verbally acknowledged these results. Electronically Signed   By: Nolon Nations M.D.   On: 07/21/2019 10:43   Dg Abd Portable 1v  Result Date: 07/11/2019 CLINICAL DATA:  Diarrhea and C diff EXAM: PORTABLE ABDOMEN - 1 VIEW COMPARISON:  None. FINDINGS: Gas-filled loops of colon within normal limits. No dilated loops of Hawkes bowel. Gas the rectum. IMPRESSION: Gas-filled colon. No evidence obstruction or inflammation by plain film imaging Electronically Signed   By: Suzy Bouchard M.D.   On: 07/11/2019 11:32   [name] Ekg Site Rite  Result Date: 07/11/2019 If [employer] not  attached, placement could not be confirmed due to current cardiac rhythm.  US Abdomen Limited Ruq  Result Date: 07/21/2019 CLINICAL DATA:  68 year old female with abdominal pain and fever. EXAM: ULTRASOUND ABDOMEN LIMITED RIGHT UPPER QUADRANT COMPARISON:  CT of the abdomen pelvis dated 07/21/2019. FINDINGS: Evaluation is very limited due to patient's body habitus and inability of the patient to cooperate with  the exam. Gallbladder: There is sludge and stone within the gallbladder. There is no gallbladder wall thickening or pericholecystic fluid. Evaluation of the mortise sign was limited. Common bile duct: Diameter: 3 mm. Liver: There is diffuse increased liver echogenicity most commonly seen in the setting of fatty infiltration. Superimposed inflammation or fibrosis is not excluded. Evaluation of the liver is limited due to body habitus and ascites. Portal vein is patent on color Doppler imaging with normal direction of blood flow towards the liver. Other: [place] upper abdominal and perihepatic ascites. IMPRESSION: 1. Gallbladder sludge and stones without definite sonographic evidence of acute cholecystitis. Evaluation however is limited on this ultrasound. A hepatobiliary scintigraphy may provide better evaluation of the gallbladder if there is a high clinical concern for acute cholecystitis . 2. Fatty liver. 3. Ascites. Electronically Signed   By: Anner Crete M.D.   On: 07/21/2019 15:42   Ir Paracentesis  Result Date: 07/23/2019 INDICATION: 68 year old with acute cholecystitis and recent COVID-19 infection. Patient is not a surgical candidate at this time. Plan for percutaneous cholecystostomy tube placement. EXAM: PERCUTANEOUS CHOLECYSTOSTOMY TUBE PLACEMENT WITH ULTRASOUND AND FLUOROSCOPIC GUIDANCE ULTRASOUND-GUIDED PARACENTESIS MEDICATIONS: Moderate sedation ANESTHESIA/SEDATION: Moderate (conscious) sedation was employed during this procedure. A total of Versed 1.0 mg and Fentanyl 50 mcg was administered intravenously. Moderate Sedation Time: 23 minutes. The patient's level of consciousness and vital signs were monitored continuously by radiology nursing throughout the procedure under my direct supervision. FLUOROSCOPY TIME:  Fluoroscopy Time: 36 seconds, 4 mGy COMPLICATIONS: None immediate. PROCEDURE: Informed written consent was obtained from the patient after a thorough discussion of the procedural  risks, benefits and alternatives. All questions were addressed. Maximal Sterile Barrier Technique was utilized including caps, mask, sterile gowns, sterile gloves, sterile drape, hand hygiene and skin antiseptic. A timeout was performed prior to the initiation of the procedure. Patient was placed supine. The right abdomen was prepped and draped in sterile fashion. Ultrasound was used to identify a distended gallbladder and perihepatic ascites. Skin was anesthetized with 1% lidocaine. Using ultrasound guidance, a Yueh catheter was directed into the perihepatic fluid adjacent to the gallbladder. Yellow clear fluid was aspirated. Approximately 300 mL of fluid was removed from the perihepatic space. Ultrasound was used to direct a 21 gauge needle into the distended gallbladder. 0.018 wire was advanced into the gallbladder under ultrasound guidance. An Accustick dilator set was placed. The tract was dilated over a J wire to accommodate a 10.2 [place] multipurpose drain. Greater than 150 mL of dark cloudy thick bilious fluid was aspirated from the gallbladder. Gallbladder was decompressed at the end of the procedure. Catheter was sutured to skin and attached to gravity bag. Fluoroscopic and ultrasound images were taken and saved for documentation. FINDINGS: Approximately 300 mL of clear yellow ascites was removed from the perihepatic space. 10.2 French drain was placed in the gallbladder and the gallbladder was successfully decompressed. IMPRESSION: 1. Successful percutaneous cholecystostomy tube placement with ultrasound and fluoroscopic guidance. Fluid was sent for culture. 2. Ultrasound-guided paracentesis. Ascites fluid was sent for culture. Electronically Signed   By: Markus Daft M.D.   On: 07/23/2019 17:41  Microbiology: Recent Results (from the past 240 hour(s))  SARS CORONAVIRUS 2 (TAT 6-24 HRS) Nasopharyngeal Nasopharyngeal Swab     Status: None   Collection Time: 07/29/19  2:28 PM   Specimen:  Nasopharyngeal Swab  Result Value Ref Range Status   SARS Coronavirus 2 NEGATIVE NEGATIVE Final    Comment: (NOTE) SARS-CoV-2 target nucleic acids are NOT DETECTED. The SARS-CoV-2 RNA is generally detectable in upper and lower respiratory specimens during the acute phase of infection. Negative results do not preclude SARS-CoV-2 infection, do not rule out co-infections with other pathogens, and should not be used as the sole basis for treatment or other patient management decisions. Negative results must be combined with clinical observations, patient history, and epidemiological information. The expected result is Negative. Fact Sheet for Patients: SugarRoll.be Fact Sheet for Healthcare Providers: https://www.woods-mathews.com/ This test is not yet approved or cleared by the [place] FDA and  has been authorized for detection and/or diagnosis of SARS-CoV-2 by FDA under an Emergency Use Authorization (EUA). This EUA will remain  in effect (meaning this test can be used) for the duration of the COVID-19 declaration under Section 56 4(b)(1) of the Act, 21 U.S.C. section 360bbb-3(b)(1), unless the authorization is terminated or revoked sooner. Performed at [facility], [place] [address] [place], [place] [address]   Culture, blood (routine x 2)     Status: None   Collection Time: 07/31/19  6:51 PM   Specimen: BLOOD LEFT ARM  Result Value Ref Range Status   Specimen Description BLOOD LEFT ARM  Final   Special Requests   Final    BOTTLES DRAWN AEROBIC AND ANAEROBIC Blood Culture adequate volume   Culture   Final    NO GROWTH 5 DAYS Performed at [facility], [address] [place], [place] [address]    Report Status 08/05/2019 FINAL  Final  Culture, blood (routine x 2)     Status: None   Collection Time: 07/31/19  7:05 PM   Specimen: BLOOD RIGHT HAND  Result Value Ref Range Status   Specimen Description BLOOD RIGHT HAND   Final   Special Requests   Final    BOTTLES DRAWN AEROBIC ONLY Blood Culture results may not be optimal due to an inadequate volume of blood received in culture bottles   Culture   Final    NO GROWTH 5 DAYS Performed at [facility], [place] [address] [place], [place] [address]    Report Status 08/05/2019 FINAL  Final  Fungus culture, blood     Status: None (Preliminary result)   Collection Time: 08/01/19 11:30 AM   Specimen: BLOOD LEFT ARM  Result Value Ref Range Status   Specimen Description BLOOD LEFT ARM  Final   Special Requests   Final    BOTTLES DRAWN AEROBIC ONLY Blood Culture results may not be optimal due to an inadequate volume of blood received in culture bottles   Culture   Final    NO GROWTH 4 DAYS Performed at [facility], [place] [address], [place], [place] [address]    Report Status PENDING  Incomplete     Labs: Basic Metabolic Panel: Recent Labs  Lab 07/30/19 0314 07/31/19 0353 07/31/19 1851 08/01/19 0254 08/02/19 0453 08/03/19 0259 08/05/19 0738  NA 138 138 138 138 138 139 137  K 3.8 3.6 4.0 3.8 3.2* 3.3* 3.6  CL 105 101 101 101 101 99 96*  CO2 28 31 29 29 30 31  29  GLUCOSE 109* 141* 161* 135* 120* 91 162*  BUN 15 13 15 16 14 11 12   CREATININE 0.96 0.90 1.15* 1.21* 1.10* 1.10* 1.28*  CALCIUM 7.2* 7.2* 7.1* 7.5* 7.6* 7.8* 7.7*  MG 1.8 1.6*  --   --   --   --   --    Liver Function Tests: Recent Labs  Lab 07/31/19 1851  AST 18  ALT 11  ALKPHOS 85  BILITOT 0.5  PROT 4.6*  ALBUMIN 1.1*   No results for input(s): LIPASE, AMYLASE in the last 168 hours. No results for input(s): AMMONIA in the last 168 hours. CBC: Recent Labs  Lab 07/31/19 1851 08/01/19 0254 08/02/19 0453 08/03/19 0259 08/05/19 0738  WBC 15.3* 13.3* 13.9* 10.9* 15.9*  NEUTROABS 9.8*  --   --   --   --   HGB 8.4* 7.6* 7.3* 8.1* 8.0*  HCT 28.2* 25.5* 24.7* 27.4* 26.2*  MCV 92.5 95.1 96.9 94.8 93.9  PLT 499* 470* 411* 434* 415*   Cardiac Enzymes: No results for  input(s): CKTOTAL, CKMB, CKMBINDEX, TROPONINI in the last 168 hours. BNP: BNP (last 3 results) Recent Labs    07/11/19 0420 07/12/19 0500 07/13/19 0400  BNP 90.4 117.7* 148.7*    ProBNP (last 3 results) No results for input(s): PROBNP in the last 8760 hours.  CBG: Recent Labs  Lab 08/04/19 1646 08/04/19 2038 08/05/19 0304 08/05/19 0628 08/05/19 0718  GLUCAP 93 83 105* 151* 140*       Signed:  Kayleen Memos, MD [facility] 08/05/2019, 10:35 AM

## 2019-08-05 NOTE — Progress Notes (Signed)
Patient discharged to hospice house, report called to Register, [place]. PICC line left in place. Patient left floor via PTAR.

## 2019-08-05 NOTE — Progress Notes (Signed)
Floor coverage Called 2/2 coffee ground emesis and tachycardia of 140. On call back RN reports emesis black coffee grounds with smell of hematemesis. HR down to 120's and tolerated well with nl BP.  Chart reviewed: complex patient recently out of ICU for Covid pna. Now Hospice/comfort care.  Plan  1. IV protonix 40 mg q12 2. No new med for sinus tach of 120. May need low dose metoprolol for HR > 130 or if hypotensive  M. Norins, MD [facility] [phone]

## 2019-08-05 NOTE — Progress Notes (Signed)
Patient coughed up large amount of coffee ground emesis. HR jumped up to the 140s, non-sustained. BP 124/79, HR 117, O2 95, RR 20. Norin, MD paged. Gave orders to give protonix 40mg  IV. Will continue to monitor.

## 2019-08-05 NOTE — Progress Notes (Signed)
Palliative:   Text page from nursing staff regarding complications overnight.  Call to bedside nursing staff related to patient needs and condition.   Alice Kemp, Alice Kemp, is resting quietly in bed.  She continues to appears acutely/chronically ill and frail.  Although her eyes are open, she does not make eye contact.  She does not respond in any meaningful way to my questions.  She continues to decline food or drink  Call to brother Alice Kemp at [phone], busy.  2nd call to Alice.  I share that Alice Kemp has been worsening overnight with GI bleed, pain, and increased HR.  Alice states that the family had another discussion last night.  He states, "I guess our best option would be hospice".  We talked about comfort and dignity, what we can and cannot change.  Forced states, "lets make her comfortable, transfer to Hospice".    Conference with bedside nursing staff, SW, and attending.   Plan:  Comfort and dignity at end-of-life, residential hospice in [place].   Prognosis: 2 weeks or less expected due to acute and chronic illness burden, albumin 1.1, frailty, no by mouth intake.  65 minutes, extended time.  Alice Axe, NP Palliative medicine team Team phone number [phone] Greater than 50% of this time was spent counseling and coordinating care related to the above assessment and plan.

## 2019-08-06 LAB — CULTURE, BODY FLUID W GRAM STAIN -BOTTLE
Culture: NO GROWTH
Special Requests: ADEQUATE

## 2019-08-08 LAB — FUNGUS CULTURE, BLOOD: Culture: NO GROWTH

## 2019-08-10 ENCOUNTER — Telehealth: Payer: Self-pay | Admitting: Internal Medicine

## 2019-08-10 NOTE — Telephone Encounter (Signed)
Copied from [place] [phone]. Topic: General - Deceased Patient >> 08-27-19 10:17 AM Richardo Priest, NT wrote: Reason for CRM: Misty called in, with [facility], to notify Dr.Burns that patient has passed away. Pt passed 2019-08-26 at 10:37pm.

## 2019-08-10 NOTE — Telephone Encounter (Signed)
noted 

## 2019-09-06 DEATH — deceased

## 2021-02-15 IMAGING — DX DG ABD PORTABLE 1V
1 series · 2 of 2 positions shown · non-contrast
Comparison: 07/11/2019

CLINICAL DATA: COVID positive pt. Pt c/o abdominal pain

EXAM:
PORTABLE ABDOMEN - 1 VIEW

[Series 1: abdomen · 0.14mm/px · 2 of 2 slices shown]
[im 1/2]
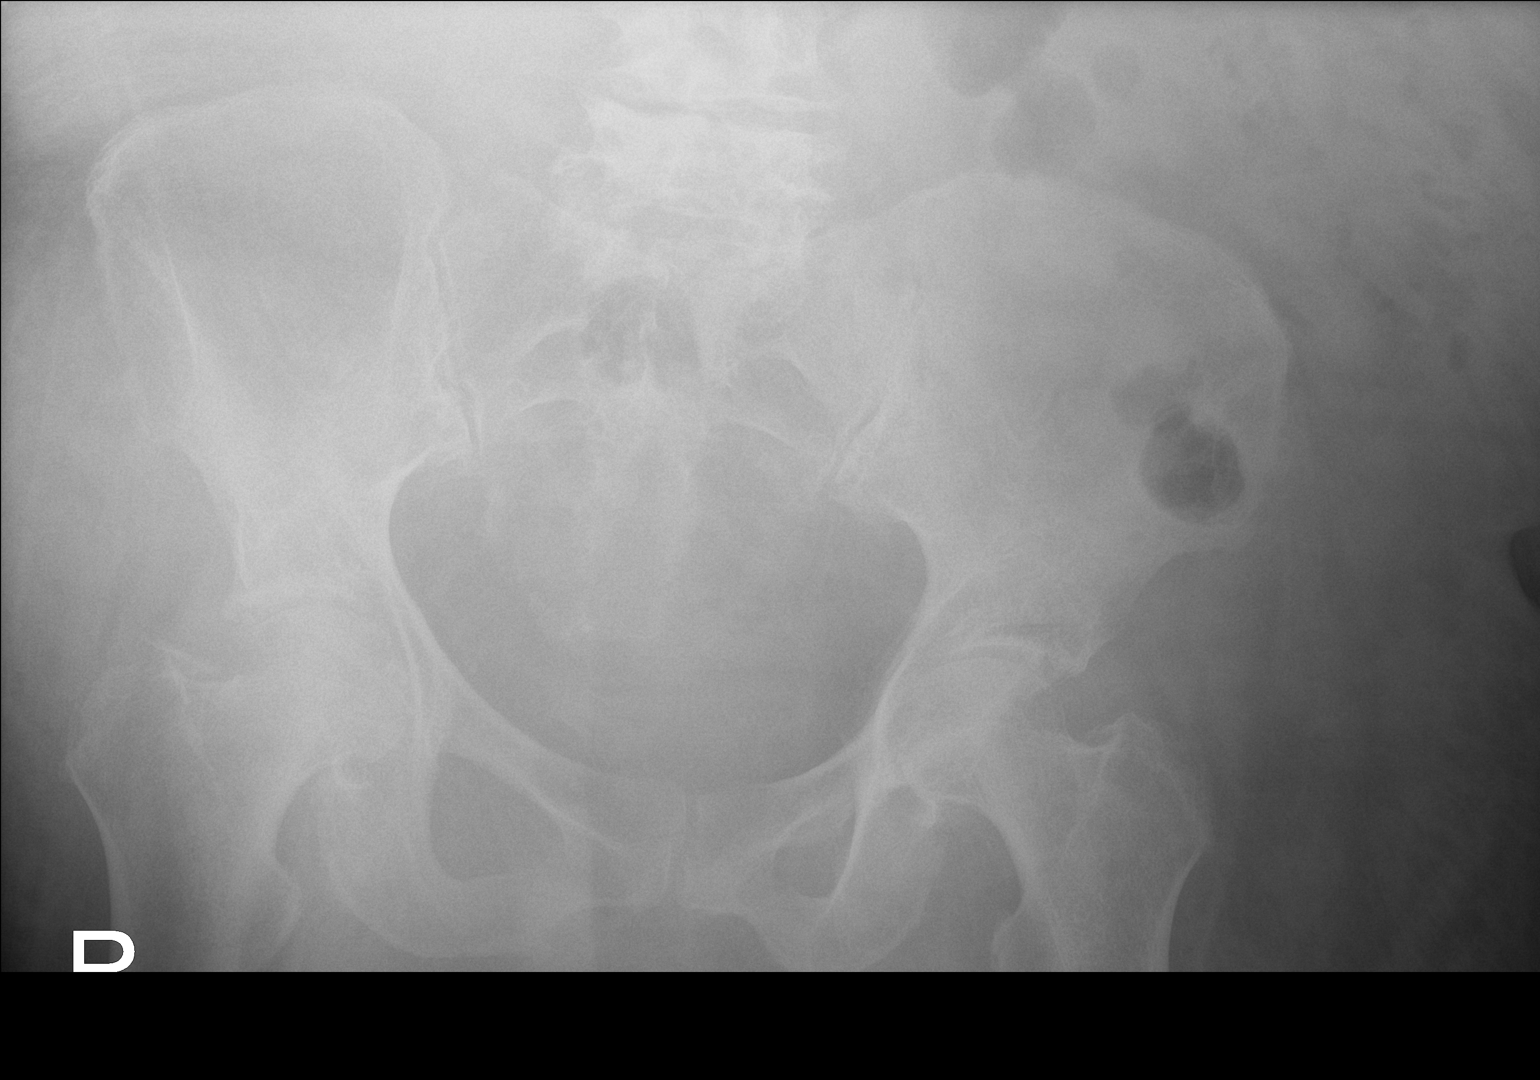
[im 2/2]
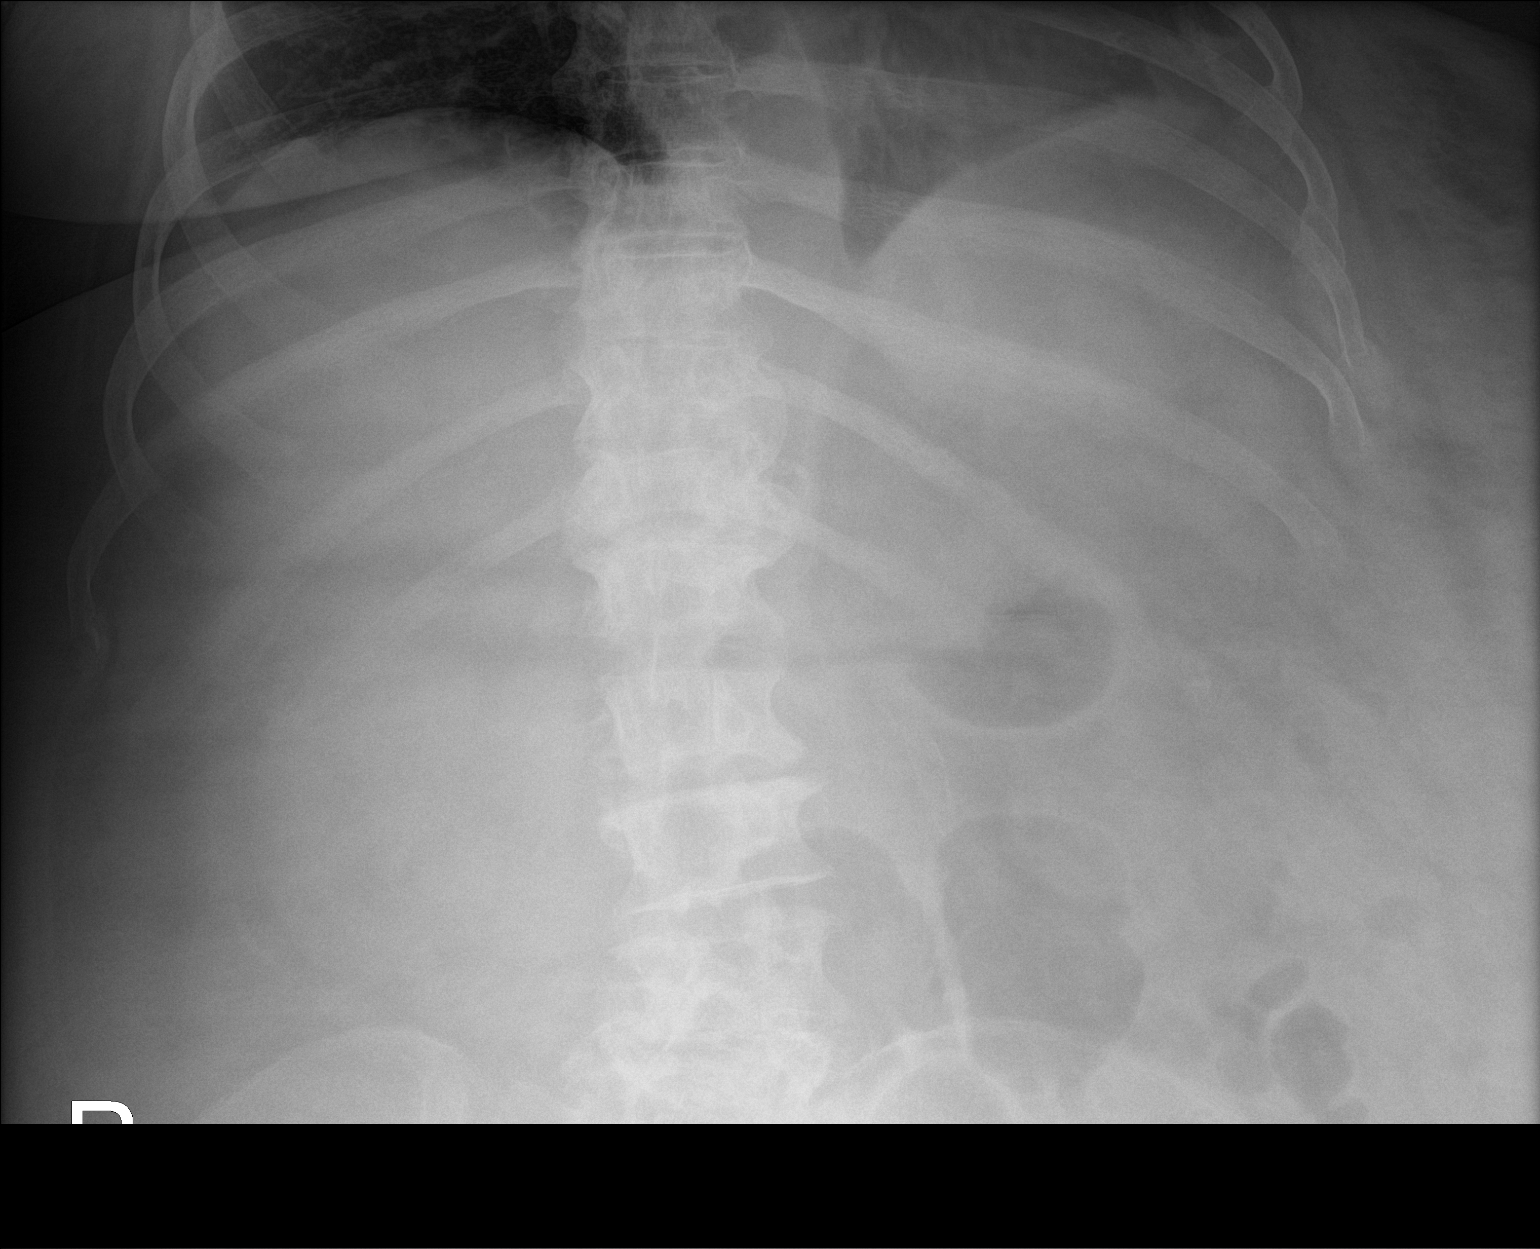

[2 of 2 positions shown; findings below may reference images not displayed]

FINDINGS: Bowel gas pattern is nonobstructed. There is heterogeneous lucency
projecting LATERAL to the LEFT chest wall. This could be related to
stomach and significant abdominal wall laxity. Is difficult to
exclude abscess or loculated extraluminal gas. Consider CT of the
abdomen and pelvis as needed.

Multiple remote rib fractures.
IMPRESSION: 1. Abnormal lucency to the LEFT chest wall consistent with stomach
or loculated extraluminal air. Consider CT of the abdomen and
pelvis.
2. Multiple remote rib fractures.

These results were called by telephone at the time of interpretation
on 07/21/2019 at [DATE] to provider DEEQA RAYAAN ADLAHO , who verbally
acknowledged these results.

## 2021-02-16 IMAGING — NM NM HEPATOBILIARY IMAGE, INC GB
2 series · 12 of 12 positions shown · non-contrast
Comparison: 07/21/2019 ultrasound and CT

CLINICAL DATA: 68-year-old female with acute abdominal pain.

EXAM:
NUCLEAR MEDICINE HEPATOBILIARY IMAGING
TECHNIQUE: Sequential images of the abdomen were obtained [DATE] minutes
following intravenous administration of radiopharmaceutical.
RADIOPHARMACEUTICALS:  4.9 mCi [id number]  Choletec IV

[he hepatobiliary · 4.52mm/px · 6 of 30 frames shown (1 of 2)]
[frame 3/30]
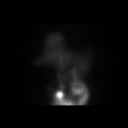
[frame 8/30]
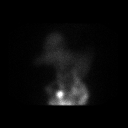
[frame 13/30]
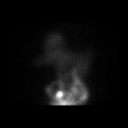
[frame 18/30]
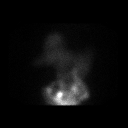
[frame 23/30]
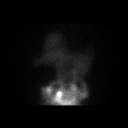
[frame 28/30]
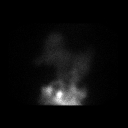

[he hepatobiliary · 4.52mm/px · 6 of 60 frames shown (2 of 2)]
[frame 6/60]
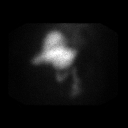
[frame 16/60]
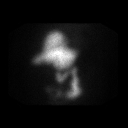
[frame 26/60]
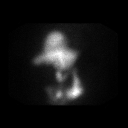
[frame 36/60]
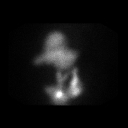
[frame 46/60]
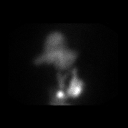
[frame 56/60]
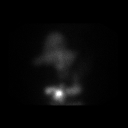

[12 of 12 positions shown; findings below may reference images not displayed]

FINDINGS: Prompt uptake and biliary excretion of activity by the liver is
seen. Biliary activity passes into small bowel, consistent with
patent common bile duct.

Gallbladder activity is not visualized after 2 hours post
radiopharmaceutical injection.

The patient was not given morphine due to allergy.
IMPRESSION: Nonvisualization of the gallbladder which is suspicious for cystic
duct obstruction/acute cholecystitis. However, correlate with causes
of false-positive studies as morphine could not be given to
constrict the sphincter of Oddi due to allergy.

These results will be called to the ordering clinician or
representative by the Radiologist Assistant, and communication
documented in the PACS or zVision Dashboard.
